# Patient Record
Sex: Female | Born: 1955
Health system: Southern US, Community
[De-identification: ages and names within clinical notes are randomized; demographics above are authoritative.]

## PROBLEM LIST (undated history)

## (undated) DIAGNOSIS — R7303 Prediabetes: Secondary | ICD-10-CM

## (undated) DIAGNOSIS — J439 Emphysema, unspecified: Secondary | ICD-10-CM

## (undated) DIAGNOSIS — I2510 Atherosclerotic heart disease of native coronary artery without angina pectoris: Secondary | ICD-10-CM

## (undated) DIAGNOSIS — E785 Hyperlipidemia, unspecified: Secondary | ICD-10-CM

## (undated) DIAGNOSIS — F329 Major depressive disorder, single episode, unspecified: Secondary | ICD-10-CM

## (undated) DIAGNOSIS — I10 Essential (primary) hypertension: Secondary | ICD-10-CM

## (undated) DIAGNOSIS — F32A Depression, unspecified: Secondary | ICD-10-CM

## (undated) DIAGNOSIS — F419 Anxiety disorder, unspecified: Secondary | ICD-10-CM

## (undated) DIAGNOSIS — J449 Chronic obstructive pulmonary disease, unspecified: Secondary | ICD-10-CM

## (undated) DIAGNOSIS — F17200 Nicotine dependence, unspecified, uncomplicated: Secondary | ICD-10-CM

## (undated) DIAGNOSIS — T7840XA Allergy, unspecified, initial encounter: Secondary | ICD-10-CM

## (undated) DIAGNOSIS — I700 Atherosclerosis of aorta: Secondary | ICD-10-CM

## (undated) HISTORY — DX: Chronic obstructive pulmonary disease, unspecified: J44.9

## (undated) HISTORY — DX: Atherosclerotic heart disease of native coronary artery without angina pectoris: I25.10

## (undated) HISTORY — DX: Allergy, unspecified, initial encounter: T78.40XA

## (undated) HISTORY — PX: MASTECTOMY: SHX3

## (undated) HISTORY — PX: COSMETIC SURGERY: SHX468

## (undated) HISTORY — PX: REDUCTION MAMMAPLASTY: SUR839

## (undated) HISTORY — DX: Prediabetes: R73.03

## (undated) HISTORY — DX: Depression, unspecified: F32.A

## (undated) HISTORY — PX: TUBAL LIGATION: SHX77

## (undated) HISTORY — DX: Hyperlipidemia, unspecified: E78.5

## (undated) HISTORY — DX: Essential (primary) hypertension: I10

## (undated) HISTORY — PX: BREAST BIOPSY: SHX20

## (undated) HISTORY — DX: Nicotine dependence, unspecified, uncomplicated: F17.200

## (undated) HISTORY — DX: Anxiety disorder, unspecified: F41.9

## (undated) HISTORY — DX: Emphysema, unspecified: J43.9

## (undated) HISTORY — DX: Atherosclerosis of aorta: I70.0

## (undated) SURGERY — Surgical Case
Anesthesia: *Unknown

---
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
## 1976-12-03 HISTORY — PX: TONSILLECTOMY: SUR1361

## 1979-12-04 HISTORY — PX: RHINOPLASTY: SUR1284

## 1999-02-08 ENCOUNTER — Other Ambulatory Visit: Admission: RE | Admit: 1999-02-08 | Discharge: 1999-02-08 | Payer: Self-pay | Admitting: *Deleted

## 1999-02-20 ENCOUNTER — Encounter: Payer: Self-pay | Admitting: *Deleted

## 1999-02-20 ENCOUNTER — Ambulatory Visit (HOSPITAL_COMMUNITY): Admission: RE | Admit: 1999-02-20 | Discharge: 1999-02-20 | Payer: Self-pay | Admitting: *Deleted

## 1999-12-21 ENCOUNTER — Encounter: Payer: Self-pay | Admitting: Emergency Medicine

## 1999-12-21 ENCOUNTER — Emergency Department (HOSPITAL_COMMUNITY): Admission: EM | Admit: 1999-12-21 | Discharge: 1999-12-21 | Payer: Self-pay | Admitting: Emergency Medicine

## 2000-02-29 ENCOUNTER — Ambulatory Visit (HOSPITAL_COMMUNITY): Admission: RE | Admit: 2000-02-29 | Discharge: 2000-02-29 | Payer: Self-pay | Admitting: *Deleted

## 2000-02-29 ENCOUNTER — Encounter: Payer: Self-pay | Admitting: *Deleted

## 2000-03-20 ENCOUNTER — Encounter: Admission: RE | Admit: 2000-03-20 | Discharge: 2000-03-20 | Payer: Self-pay | Admitting: *Deleted

## 2000-03-20 ENCOUNTER — Encounter: Payer: Self-pay | Admitting: *Deleted

## 2000-03-26 ENCOUNTER — Other Ambulatory Visit: Admission: RE | Admit: 2000-03-26 | Discharge: 2000-03-26 | Payer: Self-pay | Admitting: *Deleted

## 2001-04-15 ENCOUNTER — Ambulatory Visit (HOSPITAL_COMMUNITY): Admission: RE | Admit: 2001-04-15 | Discharge: 2001-04-15 | Payer: Self-pay | Admitting: *Deleted

## 2001-04-15 ENCOUNTER — Encounter: Payer: Self-pay | Admitting: *Deleted

## 2001-08-14 ENCOUNTER — Other Ambulatory Visit: Admission: RE | Admit: 2001-08-14 | Discharge: 2001-08-14 | Payer: Self-pay | Admitting: *Deleted

## 2002-12-24 ENCOUNTER — Ambulatory Visit (HOSPITAL_COMMUNITY): Admission: RE | Admit: 2002-12-24 | Discharge: 2002-12-24 | Payer: Self-pay | Admitting: *Deleted

## 2002-12-24 ENCOUNTER — Encounter: Payer: Self-pay | Admitting: *Deleted

## 2003-01-06 ENCOUNTER — Other Ambulatory Visit: Admission: RE | Admit: 2003-01-06 | Discharge: 2003-01-06 | Payer: Self-pay | Admitting: *Deleted

## 2004-01-25 ENCOUNTER — Ambulatory Visit (HOSPITAL_COMMUNITY): Admission: RE | Admit: 2004-01-25 | Discharge: 2004-01-25 | Payer: Self-pay | Admitting: *Deleted

## 2006-08-27 ENCOUNTER — Other Ambulatory Visit: Admission: RE | Admit: 2006-08-27 | Discharge: 2006-08-27 | Payer: Self-pay | Admitting: *Deleted

## 2006-10-02 ENCOUNTER — Encounter (INDEPENDENT_AMBULATORY_CARE_PROVIDER_SITE_OTHER): Payer: Self-pay | Admitting: Specialist

## 2006-10-02 ENCOUNTER — Ambulatory Visit (HOSPITAL_COMMUNITY): Admission: RE | Admit: 2006-10-02 | Discharge: 2006-10-02 | Payer: Self-pay | Admitting: *Deleted

## 2008-01-27 ENCOUNTER — Ambulatory Visit (HOSPITAL_COMMUNITY): Admission: RE | Admit: 2008-01-27 | Discharge: 2008-01-27 | Payer: Self-pay | Admitting: *Deleted

## 2008-07-21 ENCOUNTER — Emergency Department (HOSPITAL_BASED_OUTPATIENT_CLINIC_OR_DEPARTMENT_OTHER): Admission: EM | Admit: 2008-07-21 | Discharge: 2008-07-21 | Payer: Self-pay | Admitting: Emergency Medicine

## 2011-04-20 NOTE — Op Note (Signed)
[name], Robertson               ACCOUNT NO.:  [id number]   MEDICAL RECORD NO.:  [id number]          PATIENT TYPE:  AMB   LOCATION:  SDC                           FACILITY:  [facility]   PHYSICIAN:  Almedia Balls. Fore, M.D.   DATE OF BIRTH:  12/01/1956   DATE OF PROCEDURE:  10/02/2006  DATE OF DISCHARGE:                                 OPERATIVE REPORT   PREOPERATIVE DIAGNOSIS:  Postmenopausal bleeding.   POSTOPERATIVE DIAGNOSIS:  Postmenopausal bleeding, pending pathology.   OPERATION:  Diagnostic hysteroscopy, fractional D&C.   ANESTHESIA:  General LMA plus 10 mL 1% lidocaine paracervical block.   INDICATIONS FOR SURGERY:  The patient is a 55 year old with the above-noted  problems who was counseled as to the need for evaluation and the type of  surgery to be performed.  She was fully counseled as to the nature of the  procedure and the risks involved to include risks of anesthesia, injury to  uterus, tubes, ovaries, bowel, bladder, blood vessels, ureters,  postoperative hemorrhage, infection, recuperation.  She fully understands  all these considerations and wishes to proceed on 02 October 2006 and has  signed informed consent to proceed on October 02, 2006.   OPERATIVE FINDINGS:  On bimanual exam, the uterus was mid posterior top  normal size.  There were no palpable adnexal masses.  On hysteroscopy the  uterus sounded to 7.5 cm.  The endocervical canal was cleaned.  The  endometrial cavity had a small amount of shaggy appearing tissue present.   PROCEDURE:  With the patient under general anesthesia, prepared and draped  in the usual sterile fashion in the dorsal lithotomy position, speculum was  placed in vagina.  Single-tooth tenaculum was placed 12 o'clock position on  the cervix, and a solution of 1% lidocaine was injected at the 2, 4, 8, 10  and 12 o'clock positions of the cervix for a total of 10 mL for paracervical  block.  A small sharp curette was then utilized for curettage  of the  endocervical canal with a small amount of normal-appearing tissue being  obtained.  These the cervix was then dilated and the cervix and uterus were  then sounded as noted above and cervix dilated up to #21 [facility] dilator.  Diagnostic hysteroscope was introduced using free flow of [place] under  direct vision with the above-noted findings.  Hysteroscope was then removed,  a medium sharp curette and polyp forceps were used for removal of tissue  from the endometrial cavity.  Hysteroscope was re-employed to ensure that  all tissue had been removed and hemostasis was maintained.  After noting  this was the case and sponge and instrument counts were correct, procedure  was terminated.   ESTIMATED BLOOD LOSS:  Less than 25 mL.   The patient was taken to the recovery room in good condition.   FOLLOW-UP CARE:  She is to return to the office in 2 weeks for follow-up and  to call if heavy bleeding, pain or unexplained fever should ensue.  She was  given a prescription for Darvocet N 100 generic #10 to be  taken one-half to  one q.6 h p.r.n. pain.           ______________________________  Almedia Balls. Randell Patient, M.D.     SRF/MEDQ  D:  10/02/2006  T:  10/02/2006  Job:  [id number]

## 2011-11-09 ENCOUNTER — Ambulatory Visit: Payer: Self-pay | Admitting: Family Medicine

## 2013-03-25 ENCOUNTER — Ambulatory Visit: Payer: 59 | Attending: Orthopedic Surgery | Admitting: Physical Therapy

## 2013-03-25 DIAGNOSIS — M25619 Stiffness of unspecified shoulder, not elsewhere classified: Secondary | ICD-10-CM | POA: Insufficient documentation

## 2013-03-25 DIAGNOSIS — M25519 Pain in unspecified shoulder: Secondary | ICD-10-CM | POA: Insufficient documentation

## 2013-03-25 DIAGNOSIS — M6281 Muscle weakness (generalized): Secondary | ICD-10-CM | POA: Insufficient documentation

## 2013-03-25 DIAGNOSIS — IMO0001 Reserved for inherently not codable concepts without codable children: Secondary | ICD-10-CM | POA: Insufficient documentation

## 2013-03-31 ENCOUNTER — Ambulatory Visit: Payer: 59 | Admitting: Rehabilitation

## 2013-04-03 ENCOUNTER — Ambulatory Visit: Payer: 59 | Attending: Orthopedic Surgery | Admitting: Physical Therapy

## 2013-04-03 DIAGNOSIS — M25619 Stiffness of unspecified shoulder, not elsewhere classified: Secondary | ICD-10-CM | POA: Insufficient documentation

## 2013-04-03 DIAGNOSIS — IMO0001 Reserved for inherently not codable concepts without codable children: Secondary | ICD-10-CM | POA: Insufficient documentation

## 2013-04-03 DIAGNOSIS — M6281 Muscle weakness (generalized): Secondary | ICD-10-CM | POA: Insufficient documentation

## 2013-04-03 DIAGNOSIS — M25519 Pain in unspecified shoulder: Secondary | ICD-10-CM | POA: Insufficient documentation

## 2013-04-06 ENCOUNTER — Ambulatory Visit: Payer: 59 | Admitting: Rehabilitation

## 2013-04-09 ENCOUNTER — Ambulatory Visit: Payer: 59 | Admitting: Rehabilitation

## 2013-04-13 ENCOUNTER — Ambulatory Visit: Payer: 59 | Admitting: Physical Therapy

## 2013-04-16 ENCOUNTER — Ambulatory Visit: Payer: 59 | Admitting: Rehabilitation

## 2013-04-22 ENCOUNTER — Ambulatory Visit: Payer: 59 | Admitting: Physical Therapy

## 2013-04-24 ENCOUNTER — Ambulatory Visit: Payer: 59 | Admitting: Rehabilitation

## 2013-04-29 ENCOUNTER — Ambulatory Visit: Payer: 59 | Admitting: Physical Therapy

## 2013-05-05 ENCOUNTER — Ambulatory Visit: Payer: 59 | Attending: Orthopedic Surgery | Admitting: Physical Therapy

## 2013-05-05 DIAGNOSIS — M25519 Pain in unspecified shoulder: Secondary | ICD-10-CM | POA: Insufficient documentation

## 2013-05-05 DIAGNOSIS — M25619 Stiffness of unspecified shoulder, not elsewhere classified: Secondary | ICD-10-CM | POA: Insufficient documentation

## 2013-05-05 DIAGNOSIS — IMO0001 Reserved for inherently not codable concepts without codable children: Secondary | ICD-10-CM | POA: Insufficient documentation

## 2013-05-05 DIAGNOSIS — M6281 Muscle weakness (generalized): Secondary | ICD-10-CM | POA: Insufficient documentation

## 2015-09-29 ENCOUNTER — Other Ambulatory Visit (HOSPITAL_COMMUNITY)
Admission: RE | Admit: 2015-09-29 | Discharge: 2015-09-29 | Disposition: A | Discharge status: Home / Self Care | Payer: 59 | Source: Ambulatory Visit | Attending: Family Medicine | Admitting: Family Medicine

## 2015-09-29 ENCOUNTER — Other Ambulatory Visit: Payer: Self-pay | Admitting: Family Medicine

## 2015-09-29 DIAGNOSIS — Z124 Encounter for screening for malignant neoplasm of cervix: Secondary | ICD-10-CM | POA: Diagnosis not present

## 2015-10-03 ENCOUNTER — Other Ambulatory Visit: Payer: Self-pay | Admitting: Family Medicine

## 2015-10-03 ENCOUNTER — Other Ambulatory Visit: Payer: Self-pay

## 2015-10-03 DIAGNOSIS — Z1231 Encounter for screening mammogram for malignant neoplasm of breast: Secondary | ICD-10-CM

## 2015-10-03 DIAGNOSIS — R0989 Other specified symptoms and signs involving the circulatory and respiratory systems: Secondary | ICD-10-CM

## 2015-10-04 DIAGNOSIS — I6523 Occlusion and stenosis of bilateral carotid arteries: Secondary | ICD-10-CM

## 2015-10-04 HISTORY — DX: Occlusion and stenosis of bilateral carotid arteries: I65.23

## 2015-10-05 LAB — CYTOLOGY - PAP

## 2015-10-12 ENCOUNTER — Ambulatory Visit
Admission: RE | Admit: 2015-10-12 | Discharge: 2015-10-12 | Disposition: A | Discharge status: Home / Self Care | Payer: 59 | Source: Ambulatory Visit | Attending: Family Medicine | Admitting: Family Medicine

## 2015-10-12 DIAGNOSIS — R0989 Other specified symptoms and signs involving the circulatory and respiratory systems: Secondary | ICD-10-CM

## 2015-10-26 ENCOUNTER — Emergency Department (HOSPITAL_COMMUNITY)
Admission: EM | Admit: 2015-10-26 | Discharge: 2015-10-26 | Disposition: A | Discharge status: Home / Self Care | Payer: 59 | Source: Home / Self Care | Attending: Emergency Medicine | Admitting: Emergency Medicine

## 2015-10-26 ENCOUNTER — Encounter (HOSPITAL_COMMUNITY): Payer: Self-pay | Admitting: Emergency Medicine

## 2015-10-26 DIAGNOSIS — J40 Bronchitis, not specified as acute or chronic: Secondary | ICD-10-CM

## 2015-10-26 MED ORDER — IPRATROPIUM-ALBUTEROL 0.5-2.5 (3) MG/3ML IN SOLN
RESPIRATORY_TRACT | Status: AC
  Filled 2015-10-26: qty 3

## 2015-10-26 MED ORDER — AZITHROMYCIN 250 MG PO TABS: ORAL_TABLET | ORAL | Status: DC

## 2015-10-26 MED ORDER — ALBUTEROL SULFATE HFA 108 (90 BASE) MCG/ACT IN AERS: 2.0000 | INHALATION_SPRAY | RESPIRATORY_TRACT | Status: DC | PRN

## 2015-10-26 MED ORDER — PREDNISONE 50 MG PO TABS: ORAL_TABLET | ORAL | Status: DC

## 2015-10-26 MED ORDER — HYDROCODONE-HOMATROPINE 5-1.5 MG/5ML PO SYRP: 5.0000 mL | ORAL_SOLUTION | Freq: Four times a day (QID) | ORAL | Status: DC | PRN

## 2015-10-26 MED ORDER — IPRATROPIUM-ALBUTEROL 0.5-2.5 (3) MG/3ML IN SOLN
3.0000 mL | Freq: Once | RESPIRATORY_TRACT | Status: AC
  Administered 2015-10-26: 3 mL via RESPIRATORY_TRACT

## 2015-10-26 NOTE — Discharge Instructions (Signed)
You have bronchitis. °Take azithromycin and prednisone as prescribed. °Use hycodan as needed for cough. °Use the albuterol every 4 hours as needed for wheezing or cough. °You should see improvement in the next 3-5 days. °If you develop fevers, difficulty breathing, or are just not getting better, please come back or go to the emergency room. ° °

## 2015-10-26 NOTE — ED Notes (Signed)
Chest congestion since Sunday.

## 2015-10-26 NOTE — ED Provider Notes (Signed)
CSN: [id number]     Arrival date & time 10/26/15  1541 History   None    Chief Complaint  Patient presents with  . URI   (Consider location/radiation/quality/duration/timing/severity/associated sxs/prior Treatment) HPI  She is a 59 year old woman here for evaluation of cough. She states she started coughing 3 days ago. It has gradually been getting worse. She reports associated nasal congestion and rhinorrhea. Today, she had an episode of chest tightness and wheezing. She works in the [employer] and someone gave her an albuterol inhaler which relieved her symptoms.  She denies any fevers. No nausea or vomiting. Her appetite is normal. She states she had a similar illness last year.  History reviewed. No pertinent past medical history. Past Surgical History  Procedure Laterality Date  . Cesarean section     No family history on file. Social History  Substance Use Topics  . Smoking status: Current Every Day Smoker  . Smokeless tobacco: None  . Alcohol Use: Yes   OB History    No data available     Review of Systems As in history of present illness Allergies  Review of patient's allergies indicates no known allergies.  Home Medications   Prior to Admission medications   Medication Sig Start Date End Date Taking? Authorizing Provider  FLUoxetine (PROZAC) 20 MG capsule Take 20 mg by mouth daily.   Yes Historical Provider, MD  OVER THE COUNTER MEDICATION otc cold medicine   Yes Historical Provider, MD  albuterol (PROVENTIL HFA;VENTOLIN HFA) 108 (90 BASE) MCG/ACT inhaler Inhale 2 puffs into the lungs every 4 (four) hours as needed for wheezing or shortness of breath. 10/26/15   Melony Overly, MD  azithromycin (ZITHROMAX Z-PAK) 250 MG tablet Take 2 pills today, then 1 pill daily until gone. 10/26/15   Melony Overly, MD  HYDROcodone-homatropine (HYCODAN) 5-1.5 MG/5ML syrup Take 5 mLs by mouth every 6 (six) hours as needed for cough. 10/26/15   Melony Overly, MD  predniSONE  (DELTASONE) 50 MG tablet Take 1 pill daily for 5 days. 10/26/15   Melony Overly, MD   Meds Ordered and Administered this Visit   Medications  ipratropium-albuterol (DUONEB) 0.5-2.5 (3) MG/3ML nebulizer solution 3 mL (3 mLs Nebulization Given 10/26/15 1815)    BP 107/77 mmHg  Pulse 68  Temp(Src) 98.3 F (36.8 C) (Oral)  Resp 16  SpO2 96% No data found.   Physical Exam  Constitutional: She is oriented to person, place, and time. She appears well-developed and well-nourished. No distress.  Neck: Neck supple.  Cardiovascular: Normal rate, regular rhythm and normal heart sounds.   No murmur heard. Pulmonary/Chest: Effort normal. No respiratory distress. She has wheezes (diffuse expiratory). She has no rales.  Neurological: She is alert and oriented to person, place, and time.    ED Course  Procedures (including critical care time)  Labs Review Labs Reviewed - No data to display  Imaging Review No results found.   MDM   1. Bronchitis    She continues to have some wheezing after the DuoNeb treatment, but this is much improved. She reports subjective improvement as well. We'll discharge with prednisone, azithromycin, and Hycodan for cough. She alreday has an albuterol inhaler from earlier today. Follow-up as needed.    Melony Overly, MD 10/26/15 [phone]

## 2015-10-31 ENCOUNTER — Encounter: Payer: Self-pay | Admitting: Gastroenterology

## 2015-11-09 ENCOUNTER — Ambulatory Visit
Admission: RE | Admit: 2015-11-09 | Discharge: 2015-11-09 | Disposition: A | Discharge status: Home / Self Care | Payer: 59 | Source: Ambulatory Visit

## 2015-11-09 DIAGNOSIS — Z1231 Encounter for screening mammogram for malignant neoplasm of breast: Secondary | ICD-10-CM

## 2015-12-20 ENCOUNTER — Encounter: Payer: Self-pay | Admitting: Vascular Surgery

## 2015-12-26 MED FILL — FLUoxetine HCL 20 MG CAPS: 20 | 90 days supply | Qty: 90 | Fill #0

## 2015-12-27 ENCOUNTER — Ambulatory Visit (INDEPENDENT_AMBULATORY_CARE_PROVIDER_SITE_OTHER): Payer: 59 | Admitting: Vascular Surgery

## 2015-12-27 ENCOUNTER — Encounter: Payer: Self-pay | Admitting: Vascular Surgery

## 2015-12-27 VITALS — BP 188/83 | HR 64 | Resp 18 | Ht 69.5 in | Wt 196.6 lb

## 2015-12-27 DIAGNOSIS — I6523 Occlusion and stenosis of bilateral carotid arteries: Secondary | ICD-10-CM | POA: Diagnosis not present

## 2015-12-27 NOTE — Progress Notes (Signed)
[facility] [place]  Patient name: Ashley Robertson MRN: [id number] DOB: [birth year] Sex: female  REASON FOR VISIT: Evaluation of recent carotid duplex  HPI: Ashley Robertson is a 60 y.o. 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 She was found to have left-sided carotid bruit and underwent carotid duplex for further evaluation. Her date of her exam was 10/12/2015 at [place]. She is here today for further discussion of this. She does have a history of severe Bell's palsy in 2001 affecting her left side of her face. She has some residual inability to lift her eyebrow on the left and also some inability to raise her left lip. This does not cause her any significant disability. He specifically denies any other episodes of amaurosis fugax, transient ischemic attack and aphasia or stroke. She has a long history of cigarette smoking. No cardiac history.  History reviewed. No pertinent past medical history.  History reviewed. No pertinent family history.  SOCIAL HISTORY: Social History  Substance Use Topics  . Smoking status: Current Every Day Smoker -- 1.00 packs/day    Types: Cigarettes  . Smokeless tobacco: Not on file  . Alcohol Use: 0.0 oz/week    0 Standard drinks or equivalent per week    No Known Allergies  Current Outpatient Prescriptions  Medication Sig Dispense Refill  . aspirin 81 MG tablet Take 81 mg by mouth daily.    Marland Kitchen FLUoxetine (PROZAC) 20 MG capsule Take 20 mg by mouth daily.    . TURMERIC PO Take by mouth.    Marland Kitchen albuterol (PROVENTIL HFA;VENTOLIN HFA) 108 (90 BASE) MCG/ACT inhaler Inhale 2 puffs into the lungs every 4 (four) hours as needed for wheezing or shortness of breath. (Patient not taking: Reported on 12/27/2015)  0  . azithromycin (ZITHROMAX Z-PAK) 250 MG tablet Take 2 pills today, then 1 pill daily until gone. (Patient not taking: Reported on 12/27/2015) 6 tablet 0  .  HYDROcodone-homatropine (HYCODAN) 5-1.5 MG/5ML syrup Take 5 mLs by mouth every 6 (six) hours as needed for cough. (Patient not taking: Reported on 12/27/2015) 120 mL 0  . OVER THE COUNTER MEDICATION Reported on 12/27/2015    . predniSONE (DELTASONE) 50 MG tablet Take 1 pill daily for 5 days. (Patient not taking: Reported on 12/27/2015) 5 tablet 0   No current facility-administered medications for this visit.    REVIEW OF SYSTEMS:  [X]  denotes positive finding, [ ]  denotes negative finding Cardiac  Comments:  Chest pain or chest pressure:    Shortness of breath upon exertion:    Short of breath when lying flat:    Irregular heart rhythm:        Vascular    Pain in calf, thigh, or hip brought on by ambulation:    Pain in feet at night that wakes you up from your sleep:     Blood clot in your veins:    Leg swelling:         Pulmonary    Oxygen at home:    Productive cough:     Wheezing:         Neurologic    Sudden weakness in arms or legs:     Sudden numbness in arms or legs:     Sudden onset of difficulty speaking or slurred speech:    Temporary loss of vision in one eye:     Problems with dizziness:  Gastrointestinal    Blood in stool:     Vomited blood:         Genitourinary    Burning when urinating:     Blood in urine:        Psychiatric    Major depression:         Hematologic    Bleeding problems:    Problems with blood clotting too easily:        Skin    Rashes or ulcers:        Constitutional    Fever or chills:      PHYSICAL EXAM: Filed Vitals:   12/27/15 0913 12/27/15 0920  BP: 167/86 188/83  Pulse: 64   Resp: 18   Height: 5' 9.5" (1.765 m)   Weight: 196 lb 9.6 oz (89.177 kg)   SpO2: 96%     GENERAL: The patient is a well-nourished female, in no acute distress. The vital signs are documented above. CARDIAC: There is a regular rate and rhythm.  VASCULAR: Did not appreciate carotid bruits today. She does have 2+ radial pulses  bilaterally PULMONARY: There is good air exchange   MUSCULOSKELETAL: There are no major deformities or cyanosis. NEUROLOGIC: Some facial weakness in her left side of her face SKIN: There are no ulcers or rashes noted. PSYCHIATRIC: The patient has a normal affect.  DATA:  Reviewed her carotid ultrasound. The radiologist interpretation was that of mild to moderate amount of atherosclerotic plaque bilaterally. She has low flow velocities in her right and left internal carotid artery with no evidence of stenosis  MEDICAL ISSUES: Reviewed the study at length with patient. Explained there is no increased risk of stroke related to her study. She does have some plaque present but no evidence of moderate to severe narrowing. Would not recommend a follow-up study unless she has any neurologic changes. I again explained that this is related to cigarette smoking regarding amount of plaque and she has present and again encouraged her to attempt smoking cessation. She was reassured with this discussion will see [place] again on as-needed basis No Follow-up on file.   Curt Jews [facility] [place]: [phone]

## 2016-01-20 ENCOUNTER — Encounter: Payer: 59 | Admitting: Gastroenterology

## 2016-01-30 ENCOUNTER — Encounter: Payer: Self-pay | Admitting: Family Medicine

## 2016-04-09 MED FILL — FLUoxetine HCL 20 MG CAPS: 20 | 90 days supply | Qty: 90 | Fill #1

## 2016-07-16 MED FILL — FLUoxetine HCL 20 MG CAPS: 20 | 90 days supply | Qty: 90 | Fill #0

## 2016-08-28 DIAGNOSIS — J209 Acute bronchitis, unspecified: Secondary | ICD-10-CM | POA: Diagnosis not present

## 2016-08-28 MED FILL — VENTOLIN HFA 90 MCG INHALER: 108 (90 BAS | 16 days supply | Qty: 18 | Fill #0

## 2016-08-28 MED FILL — AZITHROMYCIN 250 MG TABLET: 250 | 5 days supply | Qty: 6 | Fill #0

## 2016-08-28 MED FILL — predniSONE 10 MG (21) TBPK: 10 | 6 days supply | Qty: 21 | Fill #0

## 2016-09-20 DIAGNOSIS — J209 Acute bronchitis, unspecified: Secondary | ICD-10-CM | POA: Diagnosis not present

## 2016-09-20 DIAGNOSIS — R062 Wheezing: Secondary | ICD-10-CM | POA: Diagnosis not present

## 2016-09-20 DIAGNOSIS — R05 Cough: Secondary | ICD-10-CM | POA: Diagnosis not present

## 2016-09-20 MED FILL — levoFLOXacin 750 MG TABS: 750 | 5 days supply | Qty: 5 | Fill #0

## 2016-09-20 MED FILL — IPRATROPIUM 0.06% SPRAY: 0.06 | 30 days supply | Qty: 15 | Fill #0

## 2016-09-20 MED FILL — predniSONE 20 MG TABS: 20 | 5 days supply | Qty: 15 | Fill #0

## 2016-09-21 MED FILL — BROMIPHENIR-PSEUDOEPHED-DM: 30-2-10 | 4 days supply | Qty: 180 | Fill #0

## 2016-10-01 DIAGNOSIS — Z0000 Encounter for general adult medical examination without abnormal findings: Secondary | ICD-10-CM | POA: Diagnosis not present

## 2016-10-01 DIAGNOSIS — R011 Cardiac murmur, unspecified: Secondary | ICD-10-CM | POA: Diagnosis not present

## 2016-10-01 DIAGNOSIS — F339 Major depressive disorder, recurrent, unspecified: Secondary | ICD-10-CM | POA: Diagnosis not present

## 2016-10-01 DIAGNOSIS — N6459 Other signs and symptoms in breast: Secondary | ICD-10-CM | POA: Diagnosis not present

## 2016-10-01 DIAGNOSIS — E7800 Pure hypercholesterolemia, unspecified: Secondary | ICD-10-CM | POA: Diagnosis not present

## 2016-10-03 ENCOUNTER — Other Ambulatory Visit: Payer: Self-pay | Admitting: Family Medicine

## 2016-10-03 DIAGNOSIS — R011 Cardiac murmur, unspecified: Secondary | ICD-10-CM

## 2016-10-03 DIAGNOSIS — N6459 Other signs and symptoms in breast: Secondary | ICD-10-CM

## 2016-10-03 HISTORY — PX: TRANSTHORACIC ECHOCARDIOGRAM: SHX275

## 2016-10-09 ENCOUNTER — Other Ambulatory Visit: Payer: Self-pay | Admitting: Family Medicine

## 2016-10-09 DIAGNOSIS — R011 Cardiac murmur, unspecified: Secondary | ICD-10-CM

## 2016-10-10 MED FILL — FLUoxetine HCL 40 MG CAPS: 40 | 90 days supply | Qty: 90 | Fill #0

## 2016-10-17 DIAGNOSIS — H52221 Regular astigmatism, right eye: Secondary | ICD-10-CM | POA: Diagnosis not present

## 2016-10-17 DIAGNOSIS — H5203 Hypermetropia, bilateral: Secondary | ICD-10-CM | POA: Diagnosis not present

## 2016-10-29 ENCOUNTER — Other Ambulatory Visit: Payer: Self-pay

## 2016-10-29 ENCOUNTER — Ambulatory Visit (HOSPITAL_COMMUNITY): Payer: 59 | Attending: Cardiovascular Disease

## 2016-10-29 DIAGNOSIS — E785 Hyperlipidemia, unspecified: Secondary | ICD-10-CM | POA: Insufficient documentation

## 2016-10-29 DIAGNOSIS — Z720 Tobacco use: Secondary | ICD-10-CM | POA: Diagnosis not present

## 2016-10-29 DIAGNOSIS — R011 Cardiac murmur, unspecified: Secondary | ICD-10-CM | POA: Insufficient documentation

## 2016-10-29 DIAGNOSIS — G510 Bell's palsy: Secondary | ICD-10-CM | POA: Diagnosis not present

## 2016-11-07 ENCOUNTER — Ambulatory Visit
Admission: RE | Admit: 2016-11-07 | Discharge: 2016-11-07 | Disposition: A | Discharge status: Home / Self Care | Payer: 59 | Source: Ambulatory Visit | Attending: Family Medicine | Admitting: Family Medicine

## 2016-11-07 DIAGNOSIS — N6489 Other specified disorders of breast: Secondary | ICD-10-CM | POA: Diagnosis not present

## 2016-11-07 DIAGNOSIS — N6459 Other signs and symptoms in breast: Secondary | ICD-10-CM

## 2016-11-07 DIAGNOSIS — R928 Other abnormal and inconclusive findings on diagnostic imaging of breast: Secondary | ICD-10-CM | POA: Diagnosis not present

## 2016-11-09 ENCOUNTER — Other Ambulatory Visit: Payer: 59

## 2017-01-10 MED FILL — FLUoxetine HCL 40 MG CAPS: 40 | 90 days supply | Qty: 90 | Fill #0

## 2017-04-19 MED FILL — FLUoxetine HCL 40 MG CAPS: 40 | 90 days supply | Qty: 90 | Fill #1

## 2017-07-31 MED FILL — FLUoxetine HCL 40 MG CAPS: 40 | 90 days supply | Qty: 90 | Fill #2

## 2017-08-19 MED FILL — PENICILLIN VK 500 MG TABLET: 500 | 7 days supply | Qty: 30 | Fill #0

## 2017-10-14 ENCOUNTER — Other Ambulatory Visit: Payer: Self-pay | Admitting: Family Medicine

## 2017-10-14 DIAGNOSIS — Z0000 Encounter for general adult medical examination without abnormal findings: Secondary | ICD-10-CM | POA: Diagnosis not present

## 2017-10-14 DIAGNOSIS — I10 Essential (primary) hypertension: Secondary | ICD-10-CM | POA: Diagnosis not present

## 2017-10-14 DIAGNOSIS — E7800 Pure hypercholesterolemia, unspecified: Secondary | ICD-10-CM | POA: Diagnosis not present

## 2017-10-14 DIAGNOSIS — Z139 Encounter for screening, unspecified: Secondary | ICD-10-CM

## 2017-10-14 DIAGNOSIS — F339 Major depressive disorder, recurrent, unspecified: Secondary | ICD-10-CM | POA: Diagnosis not present

## 2017-10-14 MED FILL — VENTOLIN HFA 90 MCG INHALER: 108 (90 BAS | 25 days supply | Qty: 18 | Fill #0

## 2017-11-04 MED FILL — FLUoxetine HCL 40 MG CAPS: 40 | 90 days supply | Qty: 90 | Fill #0

## 2017-11-14 ENCOUNTER — Encounter: Payer: Self-pay | Admitting: Radiology

## 2017-11-14 ENCOUNTER — Ambulatory Visit
Admission: RE | Admit: 2017-11-14 | Discharge: 2017-11-14 | Disposition: A | Discharge status: Home / Self Care | Payer: 59 | Source: Ambulatory Visit | Attending: Family Medicine | Admitting: Family Medicine

## 2017-11-14 DIAGNOSIS — Z139 Encounter for screening, unspecified: Secondary | ICD-10-CM

## 2017-11-14 DIAGNOSIS — Z1231 Encounter for screening mammogram for malignant neoplasm of breast: Secondary | ICD-10-CM | POA: Diagnosis not present

## 2018-02-05 MED FILL — FLUoxetine HCL 40 MG CAPS: 40 | 90 days supply | Qty: 90 | Fill #1

## 2018-02-13 DIAGNOSIS — J208 Acute bronchitis due to other specified organisms: Secondary | ICD-10-CM | POA: Diagnosis not present

## 2018-02-13 MED FILL — BROMIPHENIR-PSEUDOEPHED-DM: 30-2-10 | 10 days supply | Qty: 400 | Fill #0

## 2018-02-13 MED FILL — HYDROCODONE-CHLORPHENIRAM S: 10-8 | 6 days supply | Qty: 60 | Fill #0

## 2018-02-13 MED FILL — AZITHROMYCIN 250 MG TABS: 250 | 5 days supply | Qty: 6 | Fill #0

## 2018-03-26 DIAGNOSIS — B302 Viral pharyngoconjunctivitis: Secondary | ICD-10-CM | POA: Diagnosis not present

## 2018-04-29 MED FILL — FLUoxetine HCL 40 MG CAPS: 40 | 90 days supply | Qty: 90 | Fill #2

## 2018-05-22 DIAGNOSIS — M25561 Pain in right knee: Secondary | ICD-10-CM | POA: Diagnosis not present

## 2018-06-12 ENCOUNTER — Other Ambulatory Visit (HOSPITAL_COMMUNITY): Payer: Self-pay | Admitting: Physician Assistant

## 2018-06-12 DIAGNOSIS — M25561 Pain in right knee: Secondary | ICD-10-CM | POA: Diagnosis not present

## 2018-06-18 ENCOUNTER — Ambulatory Visit (HOSPITAL_COMMUNITY)
Admission: RE | Admit: 2018-06-18 | Discharge: 2018-06-18 | Disposition: A | Discharge status: Home / Self Care | Payer: 59 | Source: Ambulatory Visit | Attending: Physician Assistant | Admitting: Physician Assistant

## 2018-06-18 DIAGNOSIS — X58XXXA Exposure to other specified factors, initial encounter: Secondary | ICD-10-CM | POA: Insufficient documentation

## 2018-06-18 DIAGNOSIS — S83411A Sprain of medial collateral ligament of right knee, initial encounter: Secondary | ICD-10-CM | POA: Diagnosis not present

## 2018-06-18 DIAGNOSIS — M7121 Synovial cyst of popliteal space [Baker], right knee: Secondary | ICD-10-CM | POA: Insufficient documentation

## 2018-06-18 DIAGNOSIS — M25461 Effusion, right knee: Secondary | ICD-10-CM | POA: Diagnosis not present

## 2018-06-18 DIAGNOSIS — M25561 Pain in right knee: Secondary | ICD-10-CM

## 2018-06-18 DIAGNOSIS — S83241A Other tear of medial meniscus, current injury, right knee, initial encounter: Secondary | ICD-10-CM | POA: Diagnosis not present

## 2018-07-03 DIAGNOSIS — S83241A Other tear of medial meniscus, current injury, right knee, initial encounter: Secondary | ICD-10-CM | POA: Diagnosis not present

## 2018-07-03 DIAGNOSIS — S83411A Sprain of medial collateral ligament of right knee, initial encounter: Secondary | ICD-10-CM | POA: Diagnosis not present

## 2018-08-08 MED FILL — FLUoxetine HCL 40 MG CAPS: 40 | 90 days supply | Qty: 90 | Fill #3

## 2018-08-21 MED FILL — VENTOLIN HFA 90 MCG INHALER: 108 (90 BAS | 25 days supply | Qty: 18 | Fill #0

## 2018-08-25 DIAGNOSIS — M7121 Synovial cyst of popliteal space [Baker], right knee: Secondary | ICD-10-CM | POA: Diagnosis not present

## 2018-08-25 DIAGNOSIS — M25561 Pain in right knee: Secondary | ICD-10-CM | POA: Diagnosis not present

## 2018-10-27 ENCOUNTER — Other Ambulatory Visit (HOSPITAL_COMMUNITY)
Admission: RE | Admit: 2018-10-27 | Discharge: 2018-10-27 | Disposition: A | Discharge status: Home / Self Care | Payer: 59 | Source: Ambulatory Visit | Attending: Family Medicine | Admitting: Family Medicine

## 2018-10-27 ENCOUNTER — Other Ambulatory Visit: Payer: Self-pay | Admitting: Family Medicine

## 2018-10-27 DIAGNOSIS — Z124 Encounter for screening for malignant neoplasm of cervix: Secondary | ICD-10-CM | POA: Insufficient documentation

## 2018-10-27 DIAGNOSIS — E559 Vitamin D deficiency, unspecified: Secondary | ICD-10-CM | POA: Diagnosis not present

## 2018-10-27 DIAGNOSIS — I6529 Occlusion and stenosis of unspecified carotid artery: Secondary | ICD-10-CM | POA: Diagnosis not present

## 2018-10-27 DIAGNOSIS — R0609 Other forms of dyspnea: Secondary | ICD-10-CM | POA: Diagnosis not present

## 2018-10-27 DIAGNOSIS — Z720 Tobacco use: Secondary | ICD-10-CM | POA: Diagnosis not present

## 2018-10-27 DIAGNOSIS — Z0000 Encounter for general adult medical examination without abnormal findings: Secondary | ICD-10-CM | POA: Diagnosis not present

## 2018-10-27 DIAGNOSIS — F339 Major depressive disorder, recurrent, unspecified: Secondary | ICD-10-CM | POA: Diagnosis not present

## 2018-10-27 DIAGNOSIS — R739 Hyperglycemia, unspecified: Secondary | ICD-10-CM | POA: Diagnosis not present

## 2018-10-27 DIAGNOSIS — E7800 Pure hypercholesterolemia, unspecified: Secondary | ICD-10-CM | POA: Diagnosis not present

## 2018-10-28 LAB — CYTOLOGY - PAP: DIAGNOSIS: NEGATIVE

## 2018-11-03 MED FILL — VIT D2 1.25 MG (50,000 UNIT: 1.25 MG | 56 days supply | Qty: 8 | Fill #0

## 2018-11-11 MED FILL — FLUoxetine HCL 40 MG CAPS: 40 | 90 days supply | Qty: 90 | Fill #0

## 2018-12-02 ENCOUNTER — Other Ambulatory Visit: Payer: Self-pay | Admitting: Family Medicine

## 2018-12-02 DIAGNOSIS — Z1231 Encounter for screening mammogram for malignant neoplasm of breast: Secondary | ICD-10-CM

## 2018-12-10 ENCOUNTER — Encounter: Payer: Self-pay | Admitting: *Deleted

## 2018-12-25 ENCOUNTER — Ambulatory Visit: Payer: 59

## 2019-01-21 ENCOUNTER — Ambulatory Visit
Admission: RE | Admit: 2019-01-21 | Discharge: 2019-01-21 | Disposition: A | Discharge status: Home / Self Care | Payer: 59 | Source: Ambulatory Visit | Attending: Family Medicine | Admitting: Family Medicine

## 2019-01-21 DIAGNOSIS — Z1231 Encounter for screening mammogram for malignant neoplasm of breast: Secondary | ICD-10-CM | POA: Diagnosis not present

## 2019-02-12 MED FILL — FLUoxetine HCL 40 MG CAPS: 40 | 90 days supply | Qty: 90 | Fill #1

## 2019-04-02 MED FILL — ALBUTEROL SULFATE HFA 108 (: 108 (90 BAS | 25 days supply | Qty: 18 | Fill #0

## 2019-05-26 MED FILL — FLUoxetine HCL 40 MG CAPS: 40 | 90 days supply | Qty: 90 | Fill #2

## 2019-07-13 MED FILL — AMOXICILLIN 500 MG CAPSULE: 500 | 5 days supply | Qty: 21 | Fill #0

## 2019-07-13 MED FILL — IBUPROFEN 800 MG TAB: 800 | 5 days supply | Qty: 21 | Fill #0

## 2019-07-21 MED FILL — CLINDAMYCIN HCL 150 MG CAPS: 150 | 6 days supply | Qty: 40 | Fill #0

## 2019-08-21 MED FILL — IBUPROFEN 800 MG TAB: 800 | 3 days supply | Qty: 10 | Fill #0

## 2019-08-21 MED FILL — HYDROCODON-APAP 7.5-325: 7.5-325 | 1 days supply | Qty: 3 | Fill #0

## 2019-08-21 MED FILL — AMOXICILLIN 500 MG CAPSULE: 500 | 10 days supply | Qty: 30 | Fill #0

## 2019-09-07 MED FILL — FLUoxetine HCL 20 MG CAPS: 20 | 90 days supply | Qty: 90 | Fill #0

## 2019-11-30 MED FILL — FLUoxetine HCL 20 MG CAPS: 20 | 90 days supply | Qty: 90 | Fill #0

## 2019-12-04 DIAGNOSIS — C4490 Unspecified malignant neoplasm of skin, unspecified: Secondary | ICD-10-CM

## 2019-12-04 HISTORY — DX: Unspecified malignant neoplasm of skin, unspecified: C44.90

## 2020-02-10 ENCOUNTER — Telehealth: Payer: Self-pay | Admitting: Acute Care

## 2020-02-10 ENCOUNTER — Other Ambulatory Visit: Payer: Self-pay | Admitting: Family Medicine

## 2020-02-10 DIAGNOSIS — F17210 Nicotine dependence, cigarettes, uncomplicated: Secondary | ICD-10-CM

## 2020-02-10 DIAGNOSIS — Z87891 Personal history of nicotine dependence: Secondary | ICD-10-CM

## 2020-02-10 DIAGNOSIS — Z1231 Encounter for screening mammogram for malignant neoplasm of breast: Secondary | ICD-10-CM

## 2020-02-11 ENCOUNTER — Encounter: Payer: Self-pay | Admitting: Cardiology

## 2020-02-11 DIAGNOSIS — R7303 Prediabetes: Secondary | ICD-10-CM | POA: Diagnosis not present

## 2020-02-11 DIAGNOSIS — I6529 Occlusion and stenosis of unspecified carotid artery: Secondary | ICD-10-CM | POA: Diagnosis not present

## 2020-02-11 DIAGNOSIS — E7800 Pure hypercholesterolemia, unspecified: Secondary | ICD-10-CM | POA: Diagnosis not present

## 2020-02-11 DIAGNOSIS — Z0000 Encounter for general adult medical examination without abnormal findings: Secondary | ICD-10-CM | POA: Diagnosis not present

## 2020-02-11 DIAGNOSIS — F339 Major depressive disorder, recurrent, unspecified: Secondary | ICD-10-CM | POA: Diagnosis not present

## 2020-02-11 DIAGNOSIS — E2839 Other primary ovarian failure: Secondary | ICD-10-CM | POA: Diagnosis not present

## 2020-02-11 DIAGNOSIS — Z23 Encounter for immunization: Secondary | ICD-10-CM | POA: Diagnosis not present

## 2020-02-11 DIAGNOSIS — R030 Elevated blood-pressure reading, without diagnosis of hypertension: Secondary | ICD-10-CM | POA: Diagnosis not present

## 2020-02-11 DIAGNOSIS — E559 Vitamin D deficiency, unspecified: Secondary | ICD-10-CM | POA: Diagnosis not present

## 2020-02-11 MED FILL — FLUoxetine HCL 10 MG CAPS: 10 | 30 days supply | Qty: 30 | Fill #0

## 2020-02-11 NOTE — Telephone Encounter (Signed)
LMTC x 1  

## 2020-02-11 NOTE — Telephone Encounter (Signed)
Spoke with pt and scheduled lung screening 03/09/20 9:00 CT ordered Nothing further needed

## 2020-02-12 ENCOUNTER — Other Ambulatory Visit: Payer: Self-pay | Admitting: Family Medicine

## 2020-02-12 DIAGNOSIS — E2839 Other primary ovarian failure: Secondary | ICD-10-CM

## 2020-02-12 MED FILL — ROSUVASTATIN CALCIUM 5 MG T: 5 | 30 days supply | Qty: 30 | Fill #0

## 2020-03-03 DIAGNOSIS — I700 Atherosclerosis of aorta: Secondary | ICD-10-CM

## 2020-03-03 HISTORY — DX: Atherosclerosis of aorta: I70.0

## 2020-03-08 ENCOUNTER — Ambulatory Visit: Payer: 59

## 2020-03-09 ENCOUNTER — Ambulatory Visit (INDEPENDENT_AMBULATORY_CARE_PROVIDER_SITE_OTHER)
Admission: RE | Admit: 2020-03-09 | Discharge: 2020-03-09 | Disposition: A | Discharge status: Home / Self Care | Payer: 59 | Source: Ambulatory Visit | Attending: Acute Care | Admitting: Acute Care

## 2020-03-09 ENCOUNTER — Encounter: Payer: Self-pay | Admitting: Acute Care

## 2020-03-09 ENCOUNTER — Other Ambulatory Visit: Payer: Self-pay

## 2020-03-09 ENCOUNTER — Ambulatory Visit (INDEPENDENT_AMBULATORY_CARE_PROVIDER_SITE_OTHER): Payer: 59 | Admitting: Acute Care

## 2020-03-09 DIAGNOSIS — F17210 Nicotine dependence, cigarettes, uncomplicated: Secondary | ICD-10-CM

## 2020-03-09 DIAGNOSIS — Z87891 Personal history of nicotine dependence: Secondary | ICD-10-CM | POA: Diagnosis not present

## 2020-03-09 NOTE — Progress Notes (Signed)
Shared Decision Making Visit Lung Cancer Screening Program [phone])   Eligibility:  Age 64 y.o.  Pack Years Smoking History Calculation 40 pack year smoking history (# packs/per year x # years smoked)  Recent History of coughing up blood  no  Unexplained weight loss? no ( >Than 15 pounds within the last 6 months )  Prior History Lung / other cancer no (Diagnosis within the last 5 years already requiring surveillance chest CT Scans).  Smoking Status Current Smoker  Former Smokers: Years since quit: NA  Quit Date: NA  Visit Components:  Discussion included one or more decision making aids. yes  Discussion included risk/benefits of screening. yes  Discussion included potential follow up diagnostic testing for abnormal scans. yes  Discussion included meaning and risk of over diagnosis. yes  Discussion included meaning and risk of False Positives. yes  Discussion included meaning of total radiation exposure. yes  Counseling Included:  Importance of adherence to annual lung cancer LDCT screening. yes  Impact of comorbidities on ability to participate in the program. yes  Ability and willingness to under diagnostic treatment. yes  Smoking Cessation Counseling:  Current Smokers:   Discussed importance of smoking cessation. yes  Information about tobacco cessation classes and interventions provided to patient. yes  Patient provided with "ticket" for LDCT Scan. yes  Symptomatic Patient. no  Counseling  Diagnosis Code: Tobacco Use Z72.0  Asymptomatic Patient yes  Counseling (Intermediate counseling: > three minutes counseling) [id number]  Former Smokers:   Discussed the importance of maintaining cigarette abstinence. yes  Diagnosis Code: Personal History of Nicotine Dependence. [id number]  Information about tobacco cessation classes and interventions provided to patient. Yes  Patient provided with "ticket" for LDCT Scan. yes  Written Order for Lung Cancer  Screening with LDCT placed in Epic. Yes (CT Chest Lung Cancer Screening Low Dose W/O CM) [id number] Z12.2-Screening of respiratory organs Z87.891-Personal history of nicotine dependence  This visit was virtual. No vital signs were obtained  I have spent 25 minutes of face to face time with Ms. Aseltine discussing the risks and benefits of lung cancer screening. We viewed a power point together that explained in detail the above noted topics. We paused at intervals to allow for questions to be asked and answered to ensure understanding.We discussed that the single most powerful action that she can take to decrease her risk of developing lung cancer is to quit smoking. We discussed whether or not she is ready to commit to setting a quit date. We discussed options for tools to aid in quitting smoking including nicotine replacement therapy, non-nicotine medications, support groups, Quit Smart classes, and behavior modification. We discussed that often times setting smaller, more achievable goals, such as eliminating 1 cigarette a day for a week and then 2 cigarettes a day for a week can be helpful in slowly decreasing the number of cigarettes smoked. This allows for a sense of accomplishment as well as providing a clinical benefit. I gave her the " Be Stronger Than Your Excuses" card with contact information for community resources, classes, free nicotine replacement therapy, and access to mobile apps, text messaging, and on-line smoking cessation help. I have also given her my card and contact information in the event she needs to contact me. We discussed the time and location of the scan, and that either Doroteo Glassman RN or I will call with the results within 24-48 hours of receiving them. I have offered her  a copy of the power point we viewed  as a resource in the event they need reinforcement of the concepts we discussed today in the office. The patient verbalized understanding of all of  the above and had no  further questions upon leaving the office. They have my contact information in the event they have any further questions.  I spent 4 minutes counseling on smoking cessation and the health risks of continued tobacco abuse.  Pt. Was offered a visit with the pharmacist to review all options to enhance smoking cessation success. She declined at this time, but knows she can call us at anytime to schedule an appointment.   I explained to the patient that there has been a high incidence of coronary artery disease noted on these exams. I explained that this is a non-gated exam therefore degree or severity cannot be determined. This patient is not on statin therapy. I have asked the patient to follow-up with their PCP regarding any incidental finding of coronary artery disease and management with diet or medication as their PCP  feels is clinically indicated. The patient verbalized understanding of the above and had no further questions upon completion of the visit.     Magdalen Spatz, NP 03/09/2020 9:25 AM

## 2020-03-09 NOTE — Patient Instructions (Signed)
Thank you for participating in the [place] [facility] Screening Program. It was our pleasure to meet you today. We will call you with the results of your scan within the next few days. Your scan will be assigned a Lung RADS category score by the physicians reading the scans.  This Lung RADS score determines follow up scanning.  See below for description of categories, and follow up screening recommendations. We will be in touch to schedule your follow up screening annually or based on recommendations of our providers. We will fax a copy of your scan results to your Primary Care Physician, or the physician who referred you to the program, to ensure they have the results. Please call the office if you have any questions or concerns regarding your scanning experience or results.  Our office number is [phone]. Please speak with Denise Phelps, RN. She is our Lung Cancer Screening RN. If she is unavailable when you call, please have the office staff send her a message. She will return your call at her earliest convenience. Remember, if your scan is normal, we will scan you annually as long as you continue to meet the criteria for the program. (Age 55-77, Current smoker or smoker who has quit within the last 15 years). If you are a smoker, remember, quitting is the single most powerful action that you can take to decrease your risk of lung cancer and other pulmonary, breathing related problems. We know quitting is hard, and we are here to help.  Please let us know if there is anything we can do to help you meet your goal of quitting. If you are a former smoker, congratulations. We are proud of you! Remain smoke free! Remember you can refer friends or family members through the number above.  We will screen them to make sure they meet criteria for the program. Thank you for helping us take better care of you by participating in Lung Screening.  Lung RADS Categories:  Lung RADS 1: no nodules  or definitely non-concerning nodules.  Recommendation is for a repeat annual scan in 12 months.  Lung RADS 2:  nodules that are non-concerning in appearance and behavior with a very low likelihood of becoming an active cancer. Recommendation is for a repeat annual scan in 12 months.  Lung RADS 3: nodules that are probably non-concerning , includes nodules with a low likelihood of becoming an active cancer.  Recommendation is for a 6-month repeat screening scan. Often noted after an upper respiratory illness. We will be in touch to make sure you have no questions, and to schedule your 6-month scan.  Lung RADS 4 A: nodules with concerning findings, recommendation is most often for a follow up scan in 3 months or additional testing based on our provider's assessment of the scan. We will be in touch to make sure you have no questions and to schedule the recommended 3 month follow up scan.  Lung RADS 4 B:  indicates findings that are concerning. We will be in touch with you to schedule additional diagnostic testing based on our provider's  assessment of the scan.   

## 2020-03-10 ENCOUNTER — Other Ambulatory Visit: Payer: Self-pay | Admitting: *Deleted

## 2020-03-10 DIAGNOSIS — Z87891 Personal history of nicotine dependence: Secondary | ICD-10-CM

## 2020-03-10 DIAGNOSIS — F17210 Nicotine dependence, cigarettes, uncomplicated: Secondary | ICD-10-CM

## 2020-03-10 NOTE — Progress Notes (Signed)
Please call patient and let them  know their  low dose Ct was read as a Lung RADS 2: nodules that are benign in appearance and behavior with a very low likelihood of becoming a clinically active cancer due to size or lack of growth. Recommendation per radiology is for a repeat LDCT in 12 months. .Please let them  know we will order and schedule their  annual screening scan for 03/2021. Please let them  know there was notation of CAD on their  scan.  Please remind the patient  that this is a non-gated exam therefore degree or severity of disease  cannot be determined. Please have them  follow up with their PCP regarding potential risk factor modification, dietary therapy or pharmacologic therapy if clinically indicated. Pt.  is not  currently on statin therapy. Please place order for annual  screening scan for  03/2021 and fax results to PCP. Thanks so much.

## 2020-03-11 MED FILL — FLUoxetine HCL 10 MG CAPS: 10 | 30 days supply | Qty: 30 | Fill #1

## 2020-03-15 ENCOUNTER — Other Ambulatory Visit: Payer: Self-pay

## 2020-03-15 ENCOUNTER — Ambulatory Visit: Payer: 59 | Admitting: Cardiology

## 2020-03-15 ENCOUNTER — Encounter: Payer: Self-pay | Admitting: Cardiology

## 2020-03-15 VITALS — BP 162/94 | HR 63 | Ht 69.0 in | Wt 148.0 lb

## 2020-03-15 DIAGNOSIS — F17200 Nicotine dependence, unspecified, uncomplicated: Secondary | ICD-10-CM | POA: Diagnosis not present

## 2020-03-15 DIAGNOSIS — I2510 Atherosclerotic heart disease of native coronary artery without angina pectoris: Secondary | ICD-10-CM

## 2020-03-15 DIAGNOSIS — R7303 Prediabetes: Secondary | ICD-10-CM

## 2020-03-15 DIAGNOSIS — E785 Hyperlipidemia, unspecified: Secondary | ICD-10-CM

## 2020-03-15 DIAGNOSIS — I10 Essential (primary) hypertension: Secondary | ICD-10-CM | POA: Diagnosis not present

## 2020-03-15 DIAGNOSIS — R931 Abnormal findings on diagnostic imaging of heart and coronary circulation: Secondary | ICD-10-CM | POA: Insufficient documentation

## 2020-03-15 NOTE — Patient Instructions (Addendum)
Medication Instructions:  NO CHANGE *If you need a refill on your cardiac medications before your next appointment, please call your pharmacy*   Lab Work: Not needed  If you have labs (blood work) drawn today and your tests are completely normal, you will receive your results only by: Marland Kitchen MyChart Message (if you have MyChart) OR . A paper copy in the mail If you have any lab test that is abnormal or we need to change your treatment, we will call you to review the results.   Testing/Procedures: WILL BE SCHEDULE AT  300  CT coronary calcium score. This test is done at [address]. This is $150 out of pocket.   Coronary CalciumScan A coronary calcium scan is an imaging test used to look for deposits of calcium and other fatty materials (plaques) in the inner lining of the blood vessels of the heart (coronary arteries). These deposits of calcium and plaques can partly clog and narrow the coronary arteries without producing any symptoms or warning signs. This puts a person at risk for a heart attack. This test can detect these deposits before symptoms develop. Tell a health care provider about:  Any allergies you have.  All medicines you are taking, including vitamins, herbs, eye drops, creams, and over-the-counter medicines.  Any problems you or family members have had with anesthetic medicines.  Any blood disorders you have.  Any surgeries you have had.  Any medical conditions you have.  Whether you are pregnant or may be pregnant. What are the risks? Generally, this is a safe procedure. However, problems may occur, including:  Harm to a pregnant woman and her unborn baby. This test involves the use of radiation. Radiation exposure can be dangerous to a pregnant woman and her unborn baby. If you are pregnant, you generally should not have this procedure done.  Slight increase in the risk of cancer. This is because of the radiation  involved in the test. What happens before the procedure? No preparation is needed for this procedure. What happens during the procedure?  You will undress and remove any jewelry around your neck or chest.  You will put on a hospital gown.  Sticky electrodes will be placed on your chest. The electrodes will be connected to an electrocardiogram (ECG) machine to record a tracing of the electrical activity of your heart.  A CT scanner will take pictures of your heart. During this time, you will be asked to lie still and hold your breath for 2-3 seconds while a picture of your heart is being taken. The procedure may vary among health care providers and hospitals. What happens after the procedure?  You can get dressed.  You can return to your normal activities.  It is up to you to get the results of your test. Ask your health care provider, or the department that is doing the test, when your results will be ready. Summary  A coronary calcium scan is an imaging test used to look for deposits of calcium and other fatty materials (plaques) in the inner lining of the blood vessels of the heart (coronary arteries).  Generally, this is a safe procedure. Tell your health care provider if you are pregnant or may be pregnant.  No preparation is needed for this procedure.  A CT scanner will take pictures of your heart.  You can return to your normal activities after the scan is done. This information is not intended to replace advice  given to you by your health care provider. Make sure you discuss any questions you have with your health care provider. Document Released: 05/17/2008 Document Revised: 10/08/2016 Document Reviewed: 10/08/2016 Elsevier Interactive Patient Education  2017 [place]: At [facility], you and your health needs are our priority.  As part of our continuing mission to provide you with exceptional heart care, we have created designated Provider Care Teams.   These Care Teams include your primary Cardiologist (physician) and Advanced Practice Providers (APPs -  Physician Assistants and Nurse Practitioners) who all work together to provide you with the care you need, when you need it.   Your next appointment:   1 month(s)  The format for your next appointment:   In Person  Provider:   Glenetta Hew, MD   Other Instructions N/A

## 2020-03-15 NOTE — Progress Notes (Signed)
Primary Care Provider: Leighton Ruff, MD  Ashley Robertson at [facility] Cardiologist: Glenetta Hew, MD Pulmonologist: Eric Form, NP  Clinic Note:  Chief Complaint  Patient presents with  . New Admit To SNF    Coronary calcification on CT  . Hyperlipidemia    Poorly controlled    HPI:    Ashley Robertson is a 64 y.o. female long-term smoker with PREDIABETES (A1c 5.7) and HYPERLIPIDEMIA (last TC 279 and LDL 195) who is being seen today for the evaluation of CORONARY ARTERY CALCIFICATION seen on CT scan (along with aortic calcification/atherosclerosis and emphysema) at the request of Leighton Ruff, MD.  [name] was seen on February 11, 2020 by [facility] for routine follow-up and was scheduled for Chest CT scan (done for precancer screening with her history of smoking) -> Indicated 50 pound weight loss with "intermittent fasting ".  No consistent exercise, but takes care of grandkids and stays active.  Has had somewhat labile blood pressure checks running as high as 170/90 but currently in the 1 [id number] range systolic. --> Was started on Crestor 5 mg every other day with plans to increase to daily if tolerated. --> CT scan showed coronary calcification as well as aortic atherosclerosis and emphysema.  Was referred to cardiology when results reviewed.  Recent Hospitalizations: None  Reviewed  CV studies:    The following studies were reviewed today: (if available, images/films reviewed: From Epic Chart or Care Everywhere) . (March 09, 2020) CT Chest: 2 V (LAD & LCx) Coronary Atherosclerosis, Aortic Atherosclerosis (no aneurysm).  Mild centrilobular emphysema with mild diffuse bronchial thickening; several scattered small solitary pulmonary nodules (largest 5.6 mm in anterior left upper lobe) . (10/29/2016) TTE: EF 55 to 60%.  Normal systolic and diastolic function.  No ASD/PFO   Interval History:   Ashley Robertson presents here for cardiology evaluation stating that she  has not had any notable cardiac symptoms.  She does have some exertional dyspnea but as figured this had to do with her long-term smoking.  She does have a smoker's cough in the morning.  She denies any PND orthopnea to suggest any evidence of CHF.  She is relatively active and is caregiver for her grandkids, but denies any active exercise.  CardiovascularReview of Symptoms (Summary): positive for - Some exertional dyspnea but deconditioning and smoking (stable) negative for - chest pain, edema, irregular heartbeat, orthopnea, palpitations, paroxysmal nocturnal dyspnea, rapid heart rate, shortness of breath or Syncope/near syncope, TIA/amaurosis fugax, claudication  The patient does not have symptoms concerning for COVID-19 infection (fever, chills, cough, or new shortness of breath).  The patient is practicing social distancing & Masking.   She completed her second COVID-19 vaccine injection on Friday April 7  REVIEWED OF SYSTEMS   Review of Systems  Constitutional: Positive for weight loss (Roughly 50 pounds since initiating her intermittent fasting diet). Negative for malaise/fatigue.  HENT: Negative for congestion.   Respiratory: Positive for cough. Negative for sputum production and shortness of breath.   Gastrointestinal: Negative for abdominal pain, blood in stool and melena.  Genitourinary: Negative for dysuria and hematuria.  Musculoskeletal: Negative for joint pain.  Neurological: Negative for dizziness, focal weakness, weakness and headaches.  Psychiatric/Behavioral: Negative for memory loss. The patient is not nervous/anxious and does not have insomnia.     I have reviewed and (if needed) personally updated the patient's problem list, medications, allergies, past medical and surgical history, social and family history.   PAST MEDICAL HISTORY  Past Medical History:  Diagnosis Date  . Anxiety and depression   . Aortic atherosclerosis ([place])    Noted on chest CT  . Carotid  artery plaque, bilateral 10/2015   Mild to moderate plaque L>R without significant stenosis  . Coronary artery calcification seen on CAT scan    Noted on chest CT  . Current every day smoker   . Emphysema lung ([place])    Noted on chest CT  . Hyperlipidemia   . Prediabetes     PAST SURGICAL HISTORY   Past Surgical History:  Procedure Laterality Date  . [place]  . RHINOPLASTY  1981  . TONSILLECTOMY  1978    MEDICATIONS/ALLERGIES   Current Meds  Medication Sig  . albuterol (PROVENTIL HFA;VENTOLIN HFA) 108 (90 BASE) MCG/ACT inhaler Inhale 2 puffs into the lungs every 4 (four) hours as needed for wheezing or shortness of breath.  Marland Kitchen aspirin 81 MG tablet Take 81 mg by mouth daily.  . Cholecalciferol (VITAMIN D) 50 MCG (2000 UT) CAPS Take by mouth.  Marland Kitchen FLUoxetine (PROZAC) 20 MG capsule Take 20 mg by mouth daily.  Marland Kitchen MAGNESIUM CITRATE PO Take by mouth.  Marland Kitchen OVER THE COUNTER MEDICATION Reported on 12/27/2015  . rosuvastatin (CRESTOR) 5 MG tablet Take 5 mg by mouth daily. Take 1 tablet every other day.  . [DISCONTINUED] TURMERIC PO Take by mouth.    Allergies  Allergen Reactions  . Lipitor [Atorvastatin] Other (See Comments)    Myalgias    SOCIAL HISTORY/FAMILY HISTORY   Social History   Tobacco Use  . Smoking status: Current Every Day Smoker    Packs/day: 1.00    Years: 40.00    Pack years: 40.00    Types: Cigarettes  . Smokeless tobacco: Never Used  Substance Use Topics  . Alcohol use: Yes    Alcohol/week: 2.0 standard drinks    Types: 2 Glasses of wine per week  . 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  She used to work at [employer].   She is legally separated.  Has 2 daughters.   Smokes roughly 1 pack a day and has been doing so since age 71.   Drinks 4 to 6 cups of coffee a day.      Currently trying Intermittent  Fasting diet for weight loss.  Does not routinely exercise.   Family History  Problem Relation Age of Onset  . Transient ischemic attack Mother 65  . Parkinson's disease Father   . Hyperlipidemia Father   . Hypertension Father   . Cancer Father        Left shoulder  . Healthy Sister   . Healthy Brother   . Healthy Brother   . Healthy Brother   . Hypertension Sister     OBJCTIVE -PE, EKG, labs   Wt Readings from Last 3 Encounters:  03/15/20 148 lb (67.1 kg)  12/27/15 196 lb 9.6 oz (89.2 kg)    Physical Exam: BP (!) 162/94   Pulse 63   Ht 5\' 9"  (1.753 m)   Wt 148 lb (67.1 kg)   SpO2 97%   BMI 21.86 kg/m  --> She tells me at home her blood pressures usually range anywhere from 120/70 up to as high as 170/90, but usually in the 1 [id number] range systolic. Physical Exam  Constitutional:  She is oriented to person, place, and time. She appears well-developed and well-nourished. No distress.  Healthy-appearing woman.  Well-groomed.  Does have mild tobacco odor.  HENT:  Head: Normocephalic and atraumatic.  Eyes: Pupils are equal, round, and reactive to light. EOM are normal.  Neck: No hepatojugular reflux and no JVD present. Carotid bruit is not present. No thyromegaly present.  Cardiovascular: Normal rate, regular rhythm, normal heart sounds and intact distal pulses.  No extrasystoles are present. PMI is not displaced. Exam reveals no gallop and no friction rub.  No murmur heard. Pulmonary/Chest: Effort normal and breath sounds normal. No respiratory distress. She has no wheezes. She has no rales.  Abdominal: Soft. Bowel sounds are normal. She exhibits no distension. There is no abdominal tenderness. There is no rebound.  Musculoskeletal:        General: Deformity (Finger clubbing noted without cyanosis) present. No edema. Normal range of motion.     Cervical back: Normal range of motion and neck supple.  Neurological: She is alert and oriented to person, place, and time. No  cranial nerve deficit.  Skin: Skin is warm and dry.  Psychiatric: She has a normal mood and affect. Her behavior is normal. Judgment and thought content normal.  Vitals reviewed.    Adult ECG Report  Rate: 63 ;  Rhythm: normal sinus rhythm and Left atrial abnormality, otherwise normal axis, intervals and durations.;   Narrative Interpretation: Normal EKG  From PCP: Rate: 67;  Rhythm: normal sinus rhythm and Left atrial abnormality.  Otherwise normal axis, intervals and durations.;   Narrative Interpretation: Normal EKG  Recent Labs: 02/11/2020  Na+ 136, K+ 4.9, Cl- 98, HCO3-29, BUN 26, Cr 0.73, Glu 111, Ca2+ 9.9; AST 16, ALT 11, AlkP 82  TC 279, TG 89, HDL 69, LDL 195; A1c 5.7    ASSESSMENT/PLAN    Problem List Items Addressed This Visit    Essential hypertension (Chronic)    Not sure what to make of her current blood pressure since she says she has a significant wide range at home.  Would like to see the results of her Coronary Calcium Score CT Scan, but suspect that we would need to be little more aggressive with her treatment.  Consider ambulatory blood pressure monitoring.  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  Following ARB and diuretic, next choice would be amlodipine.  Would likely avoid beta-blocker with resting heart rate 60 bpm      Relevant Medications   rosuvastatin (CRESTOR) 5 MG tablet   Hyperlipidemia with target LDL less than 100 (Chronic)    She clear documentation of atherosclerotic disease in her aorta and coronary arteries.  Would like to shoot for an LDL less than 70 but at least less than 100.  Currently is 179.  Was just started on every other day Crestor.  I have asked that she try to increase up to daily dosing.  If tolerated, would push the dose, but suspect that  she may very well need referral to our Clinical Pharmacist run [facility] to potentially consider additional management options.      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  She now has a current CT scan showing emphysema as well as atherosclerotic disease.  Her major risk factor despite high blood pressure and hyperlipidemia is still SMOKING.  While she acknowledges this, she does not seem to be that interested in smoking cessation yet.  I think she is at this point focused on her weight loss and maintaining stable weight.  Once this is done, she would be willing to discuss lipid control.  I did explain to her, that if the coronary CT scan does show significant elevated calcium score, that smoking cessation becomes increasingly more crucial.      Coronary artery calcification seen on CAT scan - Primary (Chronic)    Coronary and aortic atherosclerosis noted on CT scan.  This is not quantified extent of coronary disease, but does suggest the presence of CAD which is not unexpected given her age, smoking, prediabetes and hyperlipidemia as well as hypertension.  I took at least 10 minutes to explain pathophysiology of coronary disease, atherosclerosis and the results of the CT scan.  We discussed negative versus positive remodeling and indications for further testing.  Plan: Quantify CAD risk with Coronary Calcium Score CT--in the absence of active symptoms, would not necessarily proceed with ischemic evaluation unless her coronary calcium score is extremely elevated.  Was just started on a statin for lipids, need to see improvement as her lipids reported controlled.  Labile blood pressure currently not on any medications.  Would like to see what her pressures look like and  follow-up to determine if we need to treat  Smoking cessation counseling provided      Relevant Medications   rosuvastatin (CRESTOR) 5 MG tablet   Other Relevant Orders   EKG 12-Lead (Completed)   CT CARDIAC SCORING   Prediabetes (Chronic)    A1c of 5.7 and 5.8 on recent checks.   Not currently on treatment as yet.  Hopefully with more weight loss this will improve. Does not have high triglycerides by last check, and her weight loss is brought below the "obese" threshold.  She does have elevated blood pressures and therefore is 2 out of the 3 features of metabolic syndrome.  Low threshold to initiate therapy.          COVID-19 Education: The signs and symptoms of COVID-19 were discussed with the patient and how to seek care for testing (follow up with PCP or arrange E-visit).   The importance of social distancing was discussed today.  I spent a total of 22 minutes with the patient. >  50% of the time was spent in direct patient consultation.  Additional time spent with chart review  / charting (studies, outside notes, etc): 16 Total Time: 38 min   Current medicines are reviewed at length with the patient today.  (+/- concerns) none  Notice: This dictation was prepared with Dragon dictation along with smaller phrase technology. Any transcriptional errors that result from this process are unintentional and may not be corrected upon review.  Patient Instructions / Medication Changes & Studies & Tests Ordered   Patient Instructions  Medication Instructions:  NO CHANGE *If you need a refill on your cardiac medications before your next appointment, please call your pharmacy*   Lab Work: Not needed  If you have labs (blood work) drawn today and your tests are completely normal, you will receive your results only by: Marland Kitchen MyChart Message (if you have MyChart) OR . A paper  copy in the mail If you have any lab test that is abnormal or we need to change your treatment, we will call  you to review the results.   Testing/Procedures: WILL BE SCHEDULE AT [place] 300  CT coronary calcium score. This test is done at [address]. This is $150 out of pocket.  Coronary CalciumScan  A coronary calcium scan is an imaging test used to look for deposits of calcium and other fatty materials (plaques) in the inner lining of the blood vessels of the heart (coronary arteries).  Elsevier Interactive Patient Education  2017 [place]: At [facility], you and your health needs are our priority.  As part of our continuing mission to provide you with exceptional heart care, we have created designated Provider Care Teams.  These Care Teams include your primary Cardiologist (physician) and Advanced Practice Providers (APPs -  Physician Assistants and Nurse Practitioners) who all work together to provide you with the care you need, when you need it.   Your next appointment:   1 month(s)  The format for your next appointment:   In Person  Provider:   Glenetta Hew, MD   Other Instructions N/A   Studies Ordered:   Orders Placed This Encounter  Procedures  . CT CARDIAC SCORING  . EKG 12-Lead     Glenetta Hew, M.D., M.S. Interventional Cardiologist   Pager # [phone] Phone # [phone] [address] [place], [place] [address]   Thank you for choosing [facility] at [facility]

## 2020-03-19 ENCOUNTER — Encounter: Payer: Self-pay | Admitting: Cardiology

## 2020-03-19 DIAGNOSIS — I10 Essential (primary) hypertension: Secondary | ICD-10-CM | POA: Insufficient documentation

## 2020-03-19 DIAGNOSIS — E785 Hyperlipidemia, unspecified: Secondary | ICD-10-CM | POA: Insufficient documentation

## 2020-03-19 DIAGNOSIS — R7303 Prediabetes: Secondary | ICD-10-CM | POA: Insufficient documentation

## 2020-03-19 DIAGNOSIS — F17200 Nicotine dependence, unspecified, uncomplicated: Secondary | ICD-10-CM | POA: Insufficient documentation

## 2020-03-19 NOTE — Assessment & Plan Note (Addendum)
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  She now has a current CT scan showing emphysema as well as atherosclerotic disease.  Her major risk factor despite high blood pressure and hyperlipidemia is still SMOKING.  While she acknowledges this, she does not seem to be that interested in smoking cessation yet.  I think she is at this point focused on her weight loss and maintaining stable weight.  Once this is done, she would be willing to discuss lipid control.  I did explain to her, that if the coronary CT scan does show significant elevated calcium score, that smoking cessation becomes increasingly more crucial.

## 2020-03-19 NOTE — Assessment & Plan Note (Signed)
Not sure what to make of her current blood pressure since she says she has a significant wide range at home.  Would like to see the results of her Coronary Calcium Score CT Scan, but suspect that we would need to be little more aggressive with her treatment.  Consider ambulatory blood pressure monitoring.  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  Following ARB and diuretic, next choice would be amlodipine.  Would likely avoid beta-blocker with resting heart rate 60 bpm

## 2020-03-19 NOTE — Assessment & Plan Note (Signed)
She clear documentation of atherosclerotic disease in her aorta and coronary arteries.  Would like to shoot for an LDL less than 70 but at least less than 100.  Currently is 179.  Was just started on every other day Crestor.  I have asked that she try to increase up to daily dosing.  If tolerated, would push the dose, but suspect that she may very well need referral to our Clinical Pharmacist run [facility] to potentially consider additional management options.

## 2020-03-19 NOTE — Assessment & Plan Note (Signed)
A1c of 5.7 and 5.8 on recent checks.   Not currently on treatment as yet.  Hopefully with more weight loss this will improve. Does not have high triglycerides by last check, and her weight loss is brought below the "obese" threshold.  She does have elevated blood pressures and therefore is 2 out of the 3 features of metabolic syndrome.  Low threshold to initiate therapy.

## 2020-03-19 NOTE — Assessment & Plan Note (Addendum)
Coronary and aortic atherosclerosis noted on CT scan.  This is not quantified extent of coronary disease, but does suggest the presence of CAD which is not unexpected given her age, smoking, prediabetes and hyperlipidemia as well as hypertension.  I took at least 10 minutes to explain pathophysiology of coronary disease, atherosclerosis and the results of the CT scan.  We discussed negative versus positive remodeling and indications for further testing.  Plan: Quantify CAD risk with Coronary Calcium Score CT--in the absence of active symptoms, would not necessarily proceed with ischemic evaluation unless her coronary calcium score is extremely elevated.  Was just started on a statin for lipids, need to see improvement as her lipids reported controlled.  Labile blood pressure currently not on any medications.  Would like to see what her pressures look like and follow-up to determine if we need to treat  Smoking cessation counseling provided

## 2020-03-24 ENCOUNTER — Ambulatory Visit (INDEPENDENT_AMBULATORY_CARE_PROVIDER_SITE_OTHER)
Admission: RE | Admit: 2020-03-24 | Discharge: 2020-03-24 | Disposition: A | Discharge status: Home / Self Care | Payer: Self-pay | Source: Ambulatory Visit | Attending: Cardiology | Admitting: Cardiology

## 2020-03-24 ENCOUNTER — Other Ambulatory Visit: Payer: Self-pay

## 2020-03-24 DIAGNOSIS — I2510 Atherosclerotic heart disease of native coronary artery without angina pectoris: Secondary | ICD-10-CM

## 2020-03-31 MED FILL — ROSUVASTATIN CALCIUM 5 MG T: 5 | 30 days supply | Qty: 30 | Fill #1

## 2020-04-06 MED FILL — PEG-3350 SOLUTION: 420 | 1 days supply | Qty: 4000 | Fill #0

## 2020-04-26 ENCOUNTER — Other Ambulatory Visit: Payer: Self-pay

## 2020-04-26 ENCOUNTER — Encounter: Payer: Self-pay | Admitting: Cardiology

## 2020-04-26 ENCOUNTER — Ambulatory Visit: Payer: 59 | Admitting: Cardiology

## 2020-04-26 VITALS — BP 142/90 | HR 69 | Temp 96.3°F | Ht 69.0 in | Wt 147.0 lb

## 2020-04-26 DIAGNOSIS — F17200 Nicotine dependence, unspecified, uncomplicated: Secondary | ICD-10-CM | POA: Diagnosis not present

## 2020-04-26 DIAGNOSIS — R0989 Other specified symptoms and signs involving the circulatory and respiratory systems: Secondary | ICD-10-CM | POA: Insufficient documentation

## 2020-04-26 DIAGNOSIS — E785 Hyperlipidemia, unspecified: Secondary | ICD-10-CM | POA: Diagnosis not present

## 2020-04-26 DIAGNOSIS — I2510 Atherosclerotic heart disease of native coronary artery without angina pectoris: Secondary | ICD-10-CM

## 2020-04-26 DIAGNOSIS — I10 Essential (primary) hypertension: Secondary | ICD-10-CM

## 2020-04-26 MED ORDER — LISINOPRIL 5 MG PO TABS: 5.0000 mg | ORAL_TABLET | Freq: Every day | ORAL | 3 refills | Status: DC

## 2020-04-26 MED FILL — LISINOPRIL 5 MG TABLET: 5 | 90 days supply | Qty: 90 | Fill #0

## 2020-04-26 NOTE — Progress Notes (Signed)
Primary Care Provider: Leighton Ruff, MD  Ashley Robertson at [facility] Cardiologist: Glenetta Hew, MD Pulmonologist: Eric Form, NP  Clinic Note:  Chief Complaint  Patient presents with  . Follow-up    1 month.  Coronary CTA results    HPI:    Ashley Robertson is a 64 y.o. female long-term smoker with PREDIABETES (A1c 5.7) and HYPERLIPIDEMIA (last TC 279 and LDL 195) who is being seen today for FOLLOW-UP evaluation of CORONARY ARTERY CALCIFICATION seen on CT scan (along with aortic calcification/atherosclerosis and emphysema).  She was initially seen at the request of Leighton Ruff, MD on March 16, 2019  TRIXY EVERSON was seen on April 13 for initial consultation.  She indicates she did not have any cardiac symptoms besides some mild exertional dyspnea.  Occasional smoker's cough.  No CHF symptoms.  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  No routine exercise.  Recent Hospitalizations: None  Reviewed  CV studies:    The following studies were reviewed today: (if available, images/films reviewed: From Epic Chart or Care Everywhere) . (March 09, 2020) CT Chest: 2 V (LAD & LCx) Coronary Atherosclerosis, Aortic Atherosclerosis (no aneurysm).  Mild centrilobular emphysema with mild diffuse bronchial thickening; several scattered small solitary pulmonary nodules (largest 5.6 mm in anterior left upper lobe) . (10/29/2016) TTE: EF 55 to 60%.  Normal systolic and diastolic function.  No ASD/PFO   Coronary Calcium Score 79.  LAD and circumflex calcification noted.  Normal ascending aorta with mild calcification.  Interval History:   Ashley Robertson presents here for follow-up of her coronary CTA.  She still only notes stable exertional dyspnea with no exertional chest pain or pressure.  No PND, orthopnea or edema.  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  CardiovascularReview of Symptoms (Summary): positive for - Stable exertional dyspnea likely related to deconditioning and  smoking (stable) negative for - chest pain, edema, irregular heartbeat, orthopnea, palpitations, paroxysmal nocturnal dyspnea, rapid heart rate, shortness of breath or Syncope/near syncope, TIA/amaurosis fugax, claudication   The patient DOES NOT Have symptoms concerning for COVID-19 infection (fever, chills, cough, or new shortness of breath).  The patient is practicing social distancing & Masking.   She completed her second COVID-19 vaccine injection on Friday April 7  REVIEWED OF SYSTEMS   Review of Systems  Constitutional: Negative for malaise/fatigue and weight loss (Roughly 50 pounds since initiating her intermittent fasting diet--now maintaining stable weight).  HENT: Negative for congestion.   Respiratory: Positive for cough. Negative for sputum production and shortness of breath.   Gastrointestinal: Negative for blood in stool and melena.  Genitourinary: Negative for hematuria.  Neurological: Negative for dizziness, focal weakness, weakness and headaches.  Psychiatric/Behavioral: Negative for memory loss. The patient is not nervous/anxious and does not have insomnia.     I have reviewed and (if needed) personally updated the patient's problem list, medications, allergies, past medical and surgical history, social and family history.   PAST MEDICAL HISTORY   Past Medical History:  Diagnosis Date  . Anxiety and depression   . Aortic atherosclerosis ([place])    Noted on chest CT  . Carotid artery plaque, bilateral 10/2015   Mild to moderate plaque L>R without significant stenosis  . Coronary artery calcification seen on CAT scan    Noted on chest CT  . Current every day smoker   . Emphysema lung ([place])    Noted on chest CT  . Hyperlipidemia   . Prediabetes     PAST SURGICAL HISTORY  Past Surgical History:  Procedure Laterality Date  . [place]  . RHINOPLASTY  1981  . TONSILLECTOMY  1978    MEDICATIONS/ALLERGIES   Current Meds  Medication Sig    . albuterol (PROVENTIL HFA;VENTOLIN HFA) 108 (90 BASE) MCG/ACT inhaler Inhale 2 puffs into the lungs every 4 (four) hours as needed for wheezing or shortness of breath.  Marland Kitchen aspirin 81 MG tablet Take 81 mg by mouth daily.  . Cholecalciferol (VITAMIN D) 50 MCG (2000 UT) CAPS Take by mouth.  Marland Kitchen FLUoxetine (PROZAC) 20 MG capsule Take 10 mg by mouth daily.   Marland Kitchen MAGNESIUM CITRATE PO Take by mouth.  Marland Kitchen OVER THE COUNTER MEDICATION Reported on 12/27/2015  . rosuvastatin (CRESTOR) 5 MG tablet Take 5 mg by mouth daily. Take 1 tablet every other day.    Allergies  Allergen Reactions  . Lipitor [Atorvastatin] Other (See Comments)    Myalgias    SOCIAL HISTORY/FAMILY HISTORY   Social History   Tobacco Use  . Smoking status: Current Every Day Smoker    Packs/day: 1.00    Years: 40.00    Pack years: 40.00    Types: Cigarettes  . Smokeless tobacco: Never Used  Substance Use Topics  . Alcohol use: Yes    Alcohol/week: 2.0 standard drinks    Types: 2 Glasses of wine per week  . 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  She used to work at [employer].   She is legally separated.  Has 2 daughters.   Smokes roughly 1 pack a day and has been doing so since age 57.   Drinks 4 to 6 cups of coffee a day.      Currently trying Intermittent Fasting diet for weight loss.  Does not routinely exercise.   Family History  Problem Relation Age of Onset  . Transient ischemic attack Mother 70  . Parkinson's disease Father   . Hyperlipidemia Father   . Hypertension Father   . Cancer Father        Left shoulder  . Healthy Sister   . Healthy Brother   . Healthy Brother   . Healthy Brother   . Hypertension Sister     OBJCTIVE -PE, EKG, labs   Wt Readings from Last 3 Encounters:  04/26/20 147 lb (66.7 kg)  03/15/20 148 lb (67.1 kg)  12/27/15 196 lb 9.6 oz (89.2 kg)     Physical Exam: BP (!) 142/90   Pulse 69   Temp (!) 96.3 F (35.7 C)   Ht 5\' 9"  (1.753 m)   Wt 147 lb (66.7 kg)   BMI 21.71 kg/m  --> She tells me at home her blood pressures usually range anywhere from 120/70 up to as high as 170/90, but usually in the 1 [id number] range systolic. Physical Exam  Constitutional: She is oriented to person, place, and time. She appears well-developed and well-nourished. No distress.  Healthy-appearing woman.  Well-groomed.  Does have mild tobacco odor.  HENT:  Head: Normocephalic and atraumatic.  Neck: No hepatojugular reflux and no JVD present. Carotid bruit is present (Soft bilateral). No thyromegaly present.  Cardiovascular: Normal rate, regular rhythm, normal heart sounds and intact distal pulses.  No extrasystoles are present. PMI is not displaced. Exam reveals no gallop and no friction rub.  No murmur heard. Pulmonary/Chest: Effort normal  and breath sounds normal. No respiratory distress. She has no wheezes. She has no rales.  Musculoskeletal:        General: Deformity (Finger clubbing noted without cyanosis) present. No edema. Normal range of motion.     Cervical back: Normal range of motion and neck supple.  Neurological: She is alert and oriented to person, place, and time.  Psychiatric: She has a normal mood and affect. Her behavior is normal. Judgment and thought content normal.  Vitals reviewed.    Adult ECG Report  Rate: 63 ;  Rhythm: normal sinus rhythm and Left atrial abnormality, otherwise normal axis, intervals and durations.;   Narrative Interpretation: Normal EKG  Recent Labs: 02/11/2020  Na+ 136, K+ 4.9, Cl- 98, HCO3-29, BUN 26, Cr 0.73, Glu 111, Ca2+ 9.9; AST 16, ALT 11, AlkP 82  TC 279, TG 89, HDL 69, LDL 195; A1c 5.7 --> started on rosuvastatin 5 mg every other day.    ASSESSMENT/PLAN    Problem List Items Addressed This Visit    Essential hypertension (Chronic)    She has now had a couple recordings showing elevated  blood pressures consistent with class I-II hypertension.  Plan: Start lisinopril 5 mg daily.      Relevant Medications   lisinopril (ZESTRIL) 5 MG tablet   Hyperlipidemia with target LDL less than 100 (Chronic)    Recently started on 5 mg Crestor.  Plan will be to try to increase it to every day from every other day.  Should be due for blood pressure reevaluation in mid to late summer.  We will have her see CVRR for blood pressure reevaluation in 3 months, if lipids not checked by then, we can check them      Relevant Medications   lisinopril (ZESTRIL) 5 MG tablet   Current every day smoker (Chronic)    Again we talked about smoking cessation very briefly.  She is contemplating but not ready to make any changes.      Coronary artery calcification seen on CAT scan - Primary (Chronic)    Quantification of CAD with coronary calcium score showed score of 79 which is relatively low risk.  She is taking an aspirin daily which may or may not be of any benefit with this low level of coronary calcium score, however with her lipids not unreasonable.  She had restarted on statin which we are increasing to daily dosing and we are starting lisinopril for blood pressure.      Relevant Medications   lisinopril (ZESTRIL) 5 MG tablet   Bilateral carotid bruits    Check carotid Dopplers      Relevant Orders   VAS US CAROTID       COVID-19 Education: The signs and symptoms of COVID-19 were discussed with the patient and how to seek care for testing (follow up with PCP or arrange E-visit).   The importance of social distancing was discussed today.  I spent a total of 18 minutes with the patient. >  50% of the time was spent in direct patient consultation.  Additional time spent with chart review  / charting (studies, outside notes, etc): 8 Total Time: 26 min   Current medicines are reviewed at length with the patient today.  (+/- concerns) none  Notice: This dictation was prepared with  Dragon dictation along with smaller phrase technology. Any transcriptional errors that result from this process are unintentional and may not be corrected upon review.  Patient Instructions / Medication Changes & Studies &  Tests Ordered   Patient Instructions  Medication Instructions:    start taking Lisinopril 5 mg one tablet daily   *If you need a refill on your cardiac medications before your next appointment, please call your pharmacy*   Lab Work: Not needed   Testing/Procedures Will Be Schedule at [facility]  [address]- March or April 2022 Your physician has requested that you have a carotid duplex. This test is an ultrasound of the carotid arteries in your neck. It looks at blood flow through these arteries that supply the brain with blood. Allow one hour for this exam. There are no restrictions or special instructions.    Follow-Up: At [facility], you and your health needs are our priority.  As part of our continuing mission to provide you with exceptional heart care, we have created designated Provider Care Teams.  These Care Teams include your primary Cardiologist (physician) and Advanced Practice Providers (APPs -  Physician Assistants and Nurse Practitioners) who all work together to provide you with the care you need, when you need it.      Your next appointment:   11 month(s) April 2022  The format for your next appointment:   In Person  Provider:   Glenetta Hew, MD   Other Instructions Your physician recommends that you schedule a follow-up appointment in 3 months with [facility] - f/u blood pressure      Studies Ordered:   Orders Placed This Encounter  Procedures  . VAS US CAROTID     Glenetta Hew, M.D., M.S. Interventional Cardiologist   Pager # [phone] Phone # [phone] [address] [place], [place] [address]   Thank you for choosing Heartcare at [facility]!!

## 2020-04-26 NOTE — Patient Instructions (Signed)
Medication Instructions:    start taking Lisinopril 5 mg one tablet daily   *If you need a refill on your cardiac medications before your next appointment, please call your pharmacy*   Lab Work: Not needed   Testing/Procedures Will Be Schedule at [facility]- March or April 2022 Your physician has requested that you have a carotid duplex. This test is an ultrasound of the carotid arteries in your neck. It looks at blood flow through these arteries that supply the brain with blood. Allow one hour for this exam. There are no restrictions or special instructions.    Follow-Up: At [facility], you and your health needs are our priority.  As part of our continuing mission to provide you with exceptional heart care, we have created designated Provider Care Teams.  These Care Teams include your primary Cardiologist (physician) and Advanced Practice Providers (APPs -  Physician Assistants and Nurse Practitioners) who all work together to provide you with the care you need, when you need it.      Your next appointment:   11 month(s) April 2022  The format for your next appointment:   In Person  Provider:   Glenetta Hew, MD   Other Instructions Your physician recommends that you schedule a follow-up appointment in 3 months with [facility] - f/u blood pressure

## 2020-04-29 ENCOUNTER — Other Ambulatory Visit: Payer: Self-pay

## 2020-04-29 ENCOUNTER — Ambulatory Visit
Admission: RE | Admit: 2020-04-29 | Discharge: 2020-04-29 | Disposition: A | Discharge status: Home / Self Care | Payer: 59 | Source: Ambulatory Visit | Attending: Family Medicine | Admitting: Family Medicine

## 2020-04-29 DIAGNOSIS — Z780 Asymptomatic menopausal state: Secondary | ICD-10-CM | POA: Diagnosis not present

## 2020-04-29 DIAGNOSIS — M8589 Other specified disorders of bone density and structure, multiple sites: Secondary | ICD-10-CM | POA: Diagnosis not present

## 2020-04-29 DIAGNOSIS — Z1231 Encounter for screening mammogram for malignant neoplasm of breast: Secondary | ICD-10-CM | POA: Diagnosis not present

## 2020-04-29 DIAGNOSIS — E2839 Other primary ovarian failure: Secondary | ICD-10-CM

## 2020-04-30 MED FILL — ROSUVASTATIN CALCIUM 5 MG T: 5 | 30 days supply | Qty: 30 | Fill #2

## 2020-04-30 MED FILL — FLUoxetine HCL 10 MG CAPS: 10 | 30 days supply | Qty: 30 | Fill #3

## 2020-05-02 ENCOUNTER — Encounter: Payer: Self-pay | Admitting: Cardiology

## 2020-05-02 NOTE — Assessment & Plan Note (Signed)
Recently started on 5 mg Crestor.  Plan will be to try to increase it to every day from every other day.  Should be due for blood pressure reevaluation in mid to late summer.  We will have her see CVRR for blood pressure reevaluation in 3 months, if lipids not checked by then, we can check them

## 2020-05-02 NOTE — Assessment & Plan Note (Signed)
Again we talked about smoking cessation very briefly.  She is contemplating but not ready to make any changes.

## 2020-05-02 NOTE — Assessment & Plan Note (Signed)
Quantification of CAD with coronary calcium score showed score of 79 which is relatively low risk.  She is taking an aspirin daily which may or may not be of any benefit with this low level of coronary calcium score, however with her lipids not unreasonable.  She had restarted on statin which we are increasing to daily dosing and we are starting lisinopril for blood pressure.

## 2020-05-02 NOTE — Assessment & Plan Note (Signed)
She has now had a couple recordings showing elevated blood pressures consistent with class I-II hypertension.  Plan: Start lisinopril 5 mg daily.

## 2020-05-02 NOTE — Assessment & Plan Note (Signed)
Check carotid Dopplers °

## 2020-05-06 MED FILL — ALBUTEROL SULFATE HFA 108 (: 108 (90 BAS | 25 days supply | Qty: 18 | Fill #0

## 2020-05-19 DIAGNOSIS — Z1159 Encounter for screening for other viral diseases: Secondary | ICD-10-CM | POA: Diagnosis not present

## 2020-05-19 DIAGNOSIS — E559 Vitamin D deficiency, unspecified: Secondary | ICD-10-CM | POA: Diagnosis not present

## 2020-05-19 DIAGNOSIS — E7800 Pure hypercholesterolemia, unspecified: Secondary | ICD-10-CM | POA: Diagnosis not present

## 2020-05-31 MED FILL — ROSUVASTATIN CALCIUM 5 MG T: 5 | 90 days supply | Qty: 90 | Fill #0

## 2020-05-31 MED FILL — FLUoxetine HCL 10 MG CAPS: 10 | 90 days supply | Qty: 90 | Fill #0

## 2020-06-01 ENCOUNTER — Other Ambulatory Visit (HOSPITAL_COMMUNITY): Payer: Self-pay | Admitting: Family Medicine

## 2020-06-07 DIAGNOSIS — Z1159 Encounter for screening for other viral diseases: Secondary | ICD-10-CM | POA: Diagnosis not present

## 2020-06-10 DIAGNOSIS — K6389 Other specified diseases of intestine: Secondary | ICD-10-CM | POA: Diagnosis not present

## 2020-06-10 DIAGNOSIS — D124 Benign neoplasm of descending colon: Secondary | ICD-10-CM | POA: Diagnosis not present

## 2020-06-10 DIAGNOSIS — Z1211 Encounter for screening for malignant neoplasm of colon: Secondary | ICD-10-CM | POA: Diagnosis not present

## 2020-06-10 DIAGNOSIS — K635 Polyp of colon: Secondary | ICD-10-CM | POA: Diagnosis not present

## 2020-06-10 DIAGNOSIS — K621 Rectal polyp: Secondary | ICD-10-CM | POA: Diagnosis not present

## 2020-06-10 DIAGNOSIS — D120 Benign neoplasm of cecum: Secondary | ICD-10-CM | POA: Diagnosis not present

## 2020-06-10 DIAGNOSIS — K559 Vascular disorder of intestine, unspecified: Secondary | ICD-10-CM | POA: Diagnosis not present

## 2020-06-10 DIAGNOSIS — D123 Benign neoplasm of transverse colon: Secondary | ICD-10-CM | POA: Diagnosis not present

## 2020-06-10 DIAGNOSIS — K633 Ulcer of intestine: Secondary | ICD-10-CM | POA: Diagnosis not present

## 2020-07-19 ENCOUNTER — Other Ambulatory Visit: Payer: Self-pay

## 2020-07-19 ENCOUNTER — Ambulatory Visit (INDEPENDENT_AMBULATORY_CARE_PROVIDER_SITE_OTHER): Payer: 59 | Admitting: Pharmacist Clinician (PhC)/ Clinical Pharmacy Specialist

## 2020-07-19 VITALS — BP 132/84 | HR 62 | Resp 16 | Ht 69.0 in | Wt 144.6 lb

## 2020-07-19 DIAGNOSIS — I10 Essential (primary) hypertension: Secondary | ICD-10-CM | POA: Diagnosis not present

## 2020-07-19 DIAGNOSIS — F17200 Nicotine dependence, unspecified, uncomplicated: Secondary | ICD-10-CM

## 2020-07-19 DIAGNOSIS — E785 Hyperlipidemia, unspecified: Secondary | ICD-10-CM | POA: Diagnosis not present

## 2020-07-19 MED ORDER — LISINOPRIL 10 MG PO TABS: 10.0000 mg | ORAL_TABLET | Freq: Every day | ORAL | 3 refills | Status: DC

## 2020-07-19 NOTE — Assessment & Plan Note (Addendum)
Patient with essential hypertension, not quite to goal.  Will increase lisinopril from 5 mg to 10 mg daily and repeat metabolic panel in 2 weeks.  She is to check home BP readings twice daily, at least 3-4 days each week.

## 2020-07-19 NOTE — Progress Notes (Signed)
07/20/2020 Ashley Robertson [birth year] [id number]   HPI:  MARGURETE GUAMAN is a 64 y.o. female patient of Dr Ellyn Hack, with a PMH below who presents today for hypertension clinic evaluation.  At her last visit with Dr. Ellyn Hack in May, her pressure was 142/90.   He started her on lisinopril 5 mg daily and asked that she monitor home blood pressure.  She has not had any concerns about taking the lisinopril, and no adverse effects.  She does note that she increased her rosuvastatin to 5 mg daily this past May, and is tolerating that as well.  Patient also notes that she has gone to an intermittent fasting diet (she only eats within a 6 hour window each day) and has lost about 50 pounds.    Past Medical History: hyperlipidemia On rosuvastatin 5 mg qod  ASCVD Coronary calcium score 79 - 80th percentile  Pre-diabetes 5.7 on last measure.    Tobacco abuse Currently 1 ppd smoker, not interested in cessation at this time     Blood Pressure Goal:  130/80  Current Medications: lisinopril 5 mg   Family Hx: father with htn, hld, died at 55 Parkinsons; mother healthy at 39, no meds; 1 sister htn since 40's well controlled. 2 daughters, neither with issues  Social Hx: current smoker 1 ppd, wine 1-2 glasses most days; coffee all day; occasional diet coke  Diet: mostly home cooked, no added salt; fruits and vegetabls; spinach salad with salmon, chicken  Exercise: no regular exercise  Home BP readings: none  Intolerances: atorvastatin caused myalgias, started within first week of trying  Labs: 3/21:  Na 136, K 4.9, Glu 111, BUN 26, SCr 0.73  Wt Readings from Last 3 Encounters:  07/19/20 144 lb 9.6 oz (65.6 kg)  04/26/20 147 lb (66.7 kg)  03/15/20 148 lb (67.1 kg)   BP Readings from Last 3 Encounters:  07/19/20 132/84  04/26/20 (!) 142/90  03/15/20 (!) 162/94   Pulse Readings from Last 3 Encounters:  07/19/20 62  04/26/20 69  03/15/20 63    Current Outpatient Medications  Medication  Sig Dispense Refill  . albuterol (PROVENTIL HFA;VENTOLIN HFA) 108 (90 BASE) MCG/ACT inhaler Inhale 2 puffs into the lungs every 4 (four) hours as needed for wheezing or shortness of breath.  0  . aspirin 81 MG tablet Take 81 mg by mouth daily.    . Cholecalciferol (VITAMIN D) 50 MCG (2000 UT) CAPS Take by mouth.    Marland Kitchen FLUoxetine (PROZAC) 20 MG capsule Take 10 mg by mouth daily.     Marland Kitchen MAGNESIUM CITRATE PO Take by mouth.    Marland Kitchen OVER THE COUNTER MEDICATION Serrapeptase 120,000 units dailyReported on 12/27/2015    . rosuvastatin (CRESTOR) 5 MG tablet Take 5 mg by mouth daily. Take 1 tablet every other day.    . lisinopril (ZESTRIL) 10 MG tablet Take 1 tablet (10 mg total) by mouth daily. 90 tablet 3   No current facility-administered medications for this visit.    Allergies  Allergen Reactions  . Lipitor [Atorvastatin] Other (See Comments)    Myalgias    Past Medical History:  Diagnosis Date  . Anxiety and depression   . Aortic atherosclerosis ([place])    Noted on chest CT  . Carotid artery plaque, bilateral 10/2015   Mild to moderate plaque L>R without significant stenosis  . Coronary artery calcification seen on CAT scan    Noted on chest CT  . Current every day smoker   .  Emphysema lung ([place])    Noted on chest CT  . Hyperlipidemia   . Prediabetes     Blood pressure 132/84, pulse 62, resp. rate 16, height 5\' 9"  (1.753 m), weight 144 lb 9.6 oz (65.6 kg), SpO2 93 %.  Essential hypertension Patient with essential hypertension, not quite to goal.  Will increase lisinopril from 5 mg to 10 mg daily and repeat metabolic panel in 2 weeks.  She is to check home BP readings twice daily, at least 3-4 days each week.    Current every day smoker Patient currently smoking 1 ppd.  She is aware of the dangers of continued smoking, but not interested in quitting at this time.  She is aware that we have options to help her quit when she is ready to make this decision.   Hyperlipidemia with target LDL  less than 100 Now on rosuvastatin daily since May.  Will repeat lipid labs in 2 weeks, when comes to the office for metabolic panel    Tommy Medal PharmD CPP [place] [address] [place], [place] [address] [phone]

## 2020-07-19 NOTE — Patient Instructions (Signed)
Return for a a follow up appointment Sept 28 at 8 am  Go to the lab in 2 weeks (about Sept 1) for kidney function and cholesterol - please be fasting  Check your blood pressure at home twice a day, about 3-4 days per week and keep record of the readings.  Take your BP meds as follows:  Increase lisinopril to 10 mg once daily  Bring all of your meds, your BP cuff and your record of home blood pressures to your next appointment.  Exercise as youre able, try to walk approximately 30 minutes per day.  Keep salt intake to a minimum, especially watch canned and prepared boxed foods.  Eat more fresh fruits and vegetables and fewer canned items.  Avoid eating in fast food restaurants.    HOW TO TAKE YOUR BLOOD PRESSURE:  Rest 5 minutes before taking your blood pressure.   Dont smoke or drink caffeinated beverages for at least 30 minutes before.  Take your blood pressure before (not after) you eat.  Sit comfortably with your back supported and both feet on the floor (dont cross your legs).  Elevate your arm to heart level on a table or a desk.  Use the proper sized cuff. It should fit smoothly and snugly around your bare upper arm. There should be enough room to slip a fingertip under the cuff. The bottom edge of the cuff should be 1 inch above the crease of the elbow.  Ideally, take 3 measurements at one sitting and record the average.

## 2020-07-20 ENCOUNTER — Encounter: Payer: Self-pay | Admitting: Pharmacist Clinician (PhC)/ Clinical Pharmacy Specialist

## 2020-07-20 NOTE — Assessment & Plan Note (Signed)
Now on rosuvastatin daily since May.  Will repeat lipid labs in 2 weeks, when comes to the office for metabolic panel

## 2020-07-20 NOTE — Assessment & Plan Note (Signed)
Patient currently smoking 1 ppd.  She is aware of the dangers of continued smoking, but not interested in quitting at this time.  She is aware that we have options to help her quit when she is ready to make this decision.

## 2020-07-21 MED FILL — LISINOPRIL 10 MG TABS: 10 | 90 days supply | Qty: 90 | Fill #0

## 2020-08-03 DIAGNOSIS — I10 Essential (primary) hypertension: Secondary | ICD-10-CM | POA: Diagnosis not present

## 2020-08-03 LAB — COMPREHENSIVE METABOLIC PANEL
ALT: 10 IU/L (ref 0–32)
AST: 19 IU/L (ref 0–40)
Albumin/Globulin Ratio: 1.4 (ref 1.2–2.2)
Albumin: 4.1 g/dL (ref 3.8–4.8)
Alkaline Phosphatase: 88 IU/L (ref 48–121)
BUN/Creatinine Ratio: 14 (ref 12–28)
BUN: 10 mg/dL (ref 8–27)
Bilirubin Total: 0.4 mg/dL (ref 0.0–1.2)
CO2: 25 mmol/L (ref 20–29)
Calcium: 9.8 mg/dL (ref 8.7–10.3)
Chloride: 96 mmol/L (ref 96–106)
Creatinine, Ser: 0.7 mg/dL (ref 0.57–1.00)
GFR calc Af Amer: 106 mL/min/{1.73_m2} (ref >59)
GFR calc non Af Amer: 92 mL/min/{1.73_m2} (ref >59)
Globulin, Total: 2.9 g/dL (ref 1.5–4.5)
Glucose: 102 mg/dL — ABNORMAL HIGH (ref 65–99)
Potassium: 5.5 mmol/L — ABNORMAL HIGH (ref 3.5–5.2)
Sodium: 136 mmol/L (ref 134–144)
Total Protein: 7 g/dL (ref 6.0–8.5)

## 2020-08-03 LAB — LIPID PANEL
Chol/HDL Ratio: 2.5 ratio (ref 0.0–4.4)
Cholesterol, Total: 169 mg/dL (ref 100–199)
HDL: 68 mg/dL (ref >39)
LDL Chol Calc (NIH): 90 mg/dL (ref 0–99)
Triglycerides: 56 mg/dL (ref 0–149)
VLDL Cholesterol Cal: 11 mg/dL (ref 5–40)

## 2020-08-24 MED FILL — FLUoxetine HCL 10 MG CAPS: 10 | 90 days supply | Qty: 90 | Fill #0

## 2020-08-30 ENCOUNTER — Other Ambulatory Visit: Payer: Self-pay

## 2020-08-30 ENCOUNTER — Ambulatory Visit (INDEPENDENT_AMBULATORY_CARE_PROVIDER_SITE_OTHER): Payer: 59 | Admitting: Pharmacist

## 2020-08-30 VITALS — BP 138/90 | HR 78 | Resp 16 | Ht 69.0 in | Wt 144.8 lb

## 2020-08-30 DIAGNOSIS — I10 Essential (primary) hypertension: Secondary | ICD-10-CM

## 2020-08-30 MED ORDER — LISINOPRIL 5 MG PO TABS: 5.0000 mg | ORAL_TABLET | Freq: Every day | ORAL | 1 refills | Status: DC

## 2020-08-30 MED ORDER — AMLODIPINE BESYLATE 5 MG PO TABS: 5.0000 mg | ORAL_TABLET | Freq: Every day | ORAL | 1 refills | Status: DC

## 2020-08-30 MED FILL — LISINOPRIL 5 MG TABLET: 5 | 30 days supply | Qty: 30 | Fill #0

## 2020-08-30 NOTE — Patient Instructions (Addendum)
Return for a  follow up appointment in 5-6 weeks  Go to the lab in 1 weeks  Check your blood pressure at home daily (if able) and keep record of the readings.  Take your BP meds as follows: *DECREASE lisinopril to 5mg  every morning* *START taking amlodipine 5mg  every evening  Bring all of your meds, your BP cuff and your record of home blood pressures to your next appointment.  Exercise as you're able, try to walk approximately 30 minutes per day.  Keep salt intake to a minimum, especially watch canned and prepared boxed foods.  Eat more fresh fruits and vegetables and fewer canned items.  Avoid eating in fast food restaurants.    HOW TO TAKE YOUR BLOOD PRESSURE: . Rest 5 minutes before taking your blood pressure. .  Don't smoke or drink caffeinated beverages for at least 30 minutes before. . Take your blood pressure before (not after) you eat. . Sit comfortably with your back supported and both feet on the floor (don't cross your legs). . Elevate your arm to heart level on a table or a desk. . Use the proper sized cuff. It should fit smoothly and snugly around your bare upper arm. There should be enough room to slip a fingertip under the cuff. The bottom edge of the cuff should be 1 inch above the crease of the elbow. . Ideally, take 3 measurements at one sitting and record the average.

## 2020-08-30 NOTE — Progress Notes (Signed)
HPI:  Ashley Robertson is a 64 y.o. female patient of Dr Ellyn Hack, with a PMH below who presents today for hypertension clinic follow up.  At her last visit with Dr. Ellyn Hack in May, her pressure was 142/90.   He started her on lisinopril 5 mg daily and asked that she monitor home blood pressure.  During last OV we increased lisinopril to 10mg  daily.  Patient also notes that she has gone to an intermittent fasting diet (she only eats within a 6 hour window each day) and has lost about 55 pounds.  She denies dizziness, swelling, cough, chest pain, headaches, or increased fatigue. Noted her blood pressure drops significantly in the evenings, but she wakes up with VERY high BP readings. Also noted, increase K and Dr Allison Quarry recommendation to decrease lisinopril or substitute for amlodipine.  Past Medical History: hyperlipidemia On rosuvastatin 5 mg qod  ASCVD Coronary calcium score 79 - 80th percentile  Pre-diabetes 5.7 on last measure.    Tobacco abuse Currently 1 ppd smoker, not interested in cessation at this time     Blood Pressure Goal:  130/80  Current Medications: lisinopril 10mg  daily  Family Hx: father with htn, hld, died at 84 Parkinsons; mother healthy at 3, no meds; 1 sister htn since 48's well controlled. 2 daughters, neither with issues  Social Hx: current smoker 1 ppd, wine 1-2 glasses most days; coffee all day; occasional diet coke  Diet: mostly home cooked, no added salt; fruits and vegetabls; spinach salad with salmon, chicken  Exercise: no regular exercise  Home BP readings:  17 morning readings; average 159/92 (range 124-191/66-92); HR 58-76 bpm 13 afternoon readings; average 122/76 (range 100-146/63-92); HR 61-110 bpm  Intolerances: atorvastatin caused myalgias, started within first week of trying  Labs:  08/30/2020: Na 136, K 5.5, Glu 102, BUN 10, Scr 0.70 02/2020:  Na 136, K 4.9, Glu 111, BUN 26, SCr 0.73  Wt Readings from Last 3 Encounters:  08/30/20 144 lb  12.8 oz (65.7 kg)  07/19/20 144 lb 9.6 oz (65.6 kg)  04/26/20 147 lb (66.7 kg)   BP Readings from Last 3 Encounters:  08/30/20 138/90  07/19/20 132/84  04/26/20 (!) 142/90   Pulse Readings from Last 3 Encounters:  08/30/20 78  07/19/20 62  04/26/20 69    Current Outpatient Medications  Medication Sig Dispense Refill  . albuterol (PROVENTIL HFA;VENTOLIN HFA) 108 (90 BASE) MCG/ACT inhaler Inhale 2 puffs into the lungs every 4 (four) hours as needed for wheezing or shortness of breath.  0  . aspirin 81 MG tablet Take 81 mg by mouth daily.    . Cholecalciferol (VITAMIN D) 50 MCG (2000 UT) CAPS Take by mouth.    Marland Kitchen FLUoxetine (PROZAC) 20 MG capsule Take 10 mg by mouth daily.     Marland Kitchen lisinopril (ZESTRIL) 5 MG tablet Take 1 tablet (5 mg total) by mouth daily. 30 tablet 1  . MAGNESIUM CITRATE PO Take by mouth.    Marland Kitchen OVER THE COUNTER MEDICATION Serrapeptase 120,000 units dailyReported on 12/27/2015    . rosuvastatin (CRESTOR) 5 MG tablet Take 5 mg by mouth daily. Take 1 tablet every day.    Marland Kitchen amLODipine (NORVASC) 5 MG tablet Take 1 tablet (5 mg total) by mouth at bedtime. 30 tablet 1   No current facility-administered medications for this visit.    Allergies  Allergen Reactions  . Lipitor [Atorvastatin] Other (See Comments)    Myalgias    Past Medical History:  Diagnosis  Date  . Anxiety and depression   . Aortic atherosclerosis ([place])    Noted on chest CT  . Carotid artery plaque, bilateral 10/2015   Mild to moderate plaque L>R without significant stenosis  . Coronary artery calcification seen on CAT scan    Noted on chest CT  . Current every day smoker   . Emphysema lung ([place])    Noted on chest CT  . Hyperlipidemia   . Prediabetes     Blood pressure 138/90, pulse 78, resp. rate 16, height 5\' 9"  (1.753 m), weight 144 lb 12.8 oz (65.7 kg), SpO2 93 %.  Essential hypertension Blood pressure remains above desired goal, but greatly improved since initiating lisinopril. Noted  elevated potassium since increasing lisinopril from 5mg  to 10mg  daily. Morning blood pressure continues to be elevated as well.  Will decrease lisinopril back to 5mg  daily and add amlodipine 5mg  daily in the evening. Plan to increase hydration and repeat BMET in 2 weeks. Patient will follow up with HTN clinic in 5 weeks.   Zeeva Courser Rodriguez-Guzman PharmD, BCPS, [facility] [address] [place] [address] 09/06/2020 11:19 AM

## 2020-08-31 ENCOUNTER — Other Ambulatory Visit (HOSPITAL_COMMUNITY): Payer: Self-pay | Admitting: Family Medicine

## 2020-08-31 ENCOUNTER — Telehealth: Payer: Self-pay | Admitting: Cardiology

## 2020-08-31 MED ORDER — AMLODIPINE BESYLATE 5 MG PO TABS
5.0000 mg | ORAL_TABLET | Freq: Every day | ORAL | 1 refills | Status: DC
Start: 2020-08-31 — End: 2020-10-04

## 2020-08-31 MED FILL — AMLODIPINE BESYLATE 5 MG TA: 5 | 30 days supply | Qty: 30 | Fill #0

## 2020-08-31 MED FILL — ROSUVASTATIN CALCIUM 5 MG T: 5 | 90 days supply | Qty: 90 | Fill #0

## 2020-08-31 NOTE — Telephone Encounter (Signed)
Prescription resent to pharmacy.  I spoke with pharmacy and confirmed they received prescription.  They will call patient and let her know when it is ready for pick up.

## 2020-08-31 NOTE — Telephone Encounter (Signed)
Pt c/o medication issue:  1. Name of Medication: amLODipine (NORVASC) 5 MG tablet  2. How are you currently taking this medication (dosage and times per day)? Has not started yet  3. Are you having a reaction (difficulty breathing--STAT)? no  4. What is your medication issue? Patient states the pharmacy did not receive the prescription. She states she is supposed to start the medication today and it needs to be sent to [facility].

## 2020-09-02 ENCOUNTER — Other Ambulatory Visit (HOSPITAL_COMMUNITY): Payer: Self-pay | Admitting: Family Medicine

## 2020-09-02 MED FILL — FLUoxetine HCL 20 MG CAPS: 20 | 90 days supply | Qty: 90 | Fill #0

## 2020-09-05 ENCOUNTER — Other Ambulatory Visit (HOSPITAL_COMMUNITY): Payer: Self-pay | Admitting: Dentistry

## 2020-09-05 MED FILL — AMOX-CLAV 875-125 MG TABLET: 875-125 | 7 days supply | Qty: 14 | Fill #0

## 2020-09-06 ENCOUNTER — Encounter: Payer: Self-pay | Admitting: Pharmacist

## 2020-09-06 NOTE — Assessment & Plan Note (Addendum)
Blood pressure remains above desired goal, but greatly improved since initiating lisinopril. Noted elevated potassium since increasing lisinopril from 5mg  to 10mg  daily. Morning blood pressure continues to be elevated as well.  Will decrease lisinopril back to 5mg  daily and add amlodipine 5mg  daily in the evening. Plan to increase hydration and repeat BMET in 2 weeks. Patient will follow up with HTN clinic in 5 weeks.

## 2020-09-08 DIAGNOSIS — I10 Essential (primary) hypertension: Secondary | ICD-10-CM | POA: Diagnosis not present

## 2020-09-10 ENCOUNTER — Telehealth: Payer: Self-pay | Admitting: Internal Medicine

## 2020-09-10 ENCOUNTER — Other Ambulatory Visit: Payer: Self-pay

## 2020-09-10 ENCOUNTER — Ambulatory Visit (HOSPITAL_COMMUNITY)
Admission: EM | Admit: 2020-09-10 | Discharge: 2020-09-10 | Disposition: A | Discharge status: Home / Self Care | Payer: 59 | Attending: Family Medicine | Admitting: Family Medicine

## 2020-09-10 ENCOUNTER — Other Ambulatory Visit (HOSPITAL_COMMUNITY): Payer: Self-pay | Admitting: Family Medicine

## 2020-09-10 ENCOUNTER — Encounter (HOSPITAL_COMMUNITY): Payer: Self-pay

## 2020-09-10 DIAGNOSIS — I10 Essential (primary) hypertension: Secondary | ICD-10-CM

## 2020-09-10 DIAGNOSIS — E875 Hyperkalemia: Secondary | ICD-10-CM | POA: Insufficient documentation

## 2020-09-10 LAB — BASIC METABOLIC PANEL
Anion gap: 13 (ref 5–15)
BUN/Creatinine Ratio: 15 (ref 12–28)
BUN: 11 mg/dL (ref 8–27)
BUN: 9 mg/dL (ref 8–23)
CO2: 24 mmol/L (ref 20–29)
CO2: 25 mmol/L (ref 22–32)
Calcium: 10.1 mg/dL (ref 8.7–10.3)
Calcium: 9.7 mg/dL (ref 8.9–10.3)
Chloride: 93 mmol/L — ABNORMAL LOW (ref 96–106)
Chloride: 96 mmol/L — ABNORMAL LOW (ref 98–111)
Creatinine, Ser: 0.72 mg/dL (ref 0.57–1.00)
Creatinine, Ser: 0.64 mg/dL (ref 0.44–1.00)
GFR calc Af Amer: 102 mL/min/{1.73_m2} (ref >59)
GFR calc non Af Amer: 89 mL/min/{1.73_m2} (ref >59)
GFR, Estimated: >60 mL/min (ref >60)
Glucose, Bld: 108 mg/dL — ABNORMAL HIGH (ref 70–99)
Glucose: 102 mg/dL — ABNORMAL HIGH (ref 65–99)
Potassium: 6 mmol/L (ref 3.5–5.2)
Potassium: 5.2 mmol/L — ABNORMAL HIGH (ref 3.5–5.1)
Sodium: 135 mmol/L (ref 134–144)
Sodium: 134 mmol/L — ABNORMAL LOW (ref 135–145)

## 2020-09-10 MED ORDER — HYDROCHLOROTHIAZIDE 25 MG PO TABS: 25.0000 mg | ORAL_TABLET | Freq: Every day | ORAL | 0 refills | Status: DC

## 2020-09-10 MED FILL — HYDROCHLOROTHIAZIDE 25 MG T: 25 | 30 days supply | Qty: 30 | Fill #0

## 2020-09-10 NOTE — ED Triage Notes (Signed)
Patient in requesting to have potassium level checked after receiving call from MD this morning stating critical K level result of 6 that was drawn on thursday. K was checked last month with value of 5.5  Patient taking magnesium citrate daily. Also has been intermittent fasting for over a year. Eating within a 4-6 hour window daily.   Denies n/v, sob, cp,   EKG ordered, completed.

## 2020-09-10 NOTE — ED Provider Notes (Signed)
[facility]    CSN: [id number] Arrival date & time: 09/10/20  1004      History   Chief Complaint Chief Complaint  Patient presents with   Labs Only    HPI Ashley Robertson is a 64 y.o. female.   Here today at the request of her PCP for recheck of her elevated potassium to 6.0 drawn at PCP office yesterday. States her potassium seems to be creeping up since starting low dose lisinopril several months ago. Recently increased from 5 mg - 10 mg and this is when the level became critically high. Also noted a dry cough since increase. Denies CP, SOB, flank pain, urinary changes, fevers. Feels at baseline health. Of note, eats lots of fresh produce and takes mag citrate daily and has for several years.      Past Medical History:  Diagnosis Date   Anxiety and depression    Aortic atherosclerosis ([place])    Noted on chest CT   Carotid artery plaque, bilateral 10/2015   Mild to moderate plaque L>R without significant stenosis   Coronary artery calcification seen on CAT scan    Noted on chest CT   Current every day smoker    Emphysema lung ([facility])    Noted on chest CT   Hyperlipidemia    Prediabetes     Patient Active Problem List   Diagnosis Date Noted   Bilateral carotid bruits 04/26/2020   Essential hypertension 03/19/2020   Hyperlipidemia with target LDL less than 100 03/19/2020   Prediabetes 03/19/2020   Current every day smoker 03/19/2020   Coronary artery calcification seen on CAT scan 03/15/2020    Past Surgical History:  Procedure Laterality Date   CESAREAN SECTION  1990, [place]    OB History   No obstetric history on file.      Home Medications    Prior to Admission medications   Medication Sig Start Date End Date Taking? Authorizing Provider  albuterol (PROVENTIL HFA;VENTOLIN HFA) 108 (90 BASE) MCG/ACT inhaler Inhale 2 puffs into the lungs every 4 (four) hours as needed for wheezing or  shortness of breath. 10/26/15  Yes Melony Overly, MD  amLODipine (NORVASC) 5 MG tablet Take 1 tablet (5 mg total) by mouth at bedtime. 08/31/20  Yes Leonie Man, MD  aspirin 81 MG tablet Take 81 mg by mouth daily.   Yes [provider]  Cholecalciferol (VITAMIN D) 50 MCG (2000 UT) CAPS Take by mouth.   Yes [provider]  FLUoxetine (PROZAC) 20 MG capsule Take 10 mg by mouth daily.    Yes [provider]  rosuvastatin (CRESTOR) 5 MG tablet Take 5 mg by mouth daily. Take 1 tablet every day.   Yes [provider]  lisinopril (ZESTRIL) 5 MG tablet Take 1 tablet (5 mg total) by mouth daily. 08/30/20 09/10/20 Yes Leonie Man, MD  hydrochlorothiazide (HYDRODIURIL) 25 MG tablet Take 1 tablet (25 mg total) by mouth daily. 09/10/20   Volney American, PA-C  OVER THE COUNTER MEDICATION Serrapeptase 120,000 units dailyReported on 12/27/2015    [provider]    Family History Family History  Problem Relation Age of Onset   Transient ischemic attack Mother 71   Parkinson's disease Father    Hyperlipidemia Father    Hypertension Father    Cancer Father        Left shoulder   Healthy Sister    Healthy  Brother    Healthy Brother    Healthy Brother    Hypertension Sister     Social History Social History   Tobacco Use   Smoking status: Current Every Day Smoker    Packs/day: 1.00    Years: 40.00    Pack years: 40.00    Types: Cigarettes   Smokeless tobacco: Never Used  Substance Use Topics   Alcohol use: Yes    Alcohol/week: 2.0 standard drinks    Types: 2 Glasses of wine per week   Drug use: No     Allergies   Lipitor [atorvastatin]   Review of Systems Review of Systems PER HPI   Physical Exam Triage Vital Signs ED Triage Vitals [09/10/20 1042]  Enc Vitals Group     BP (!) 152/83     Pulse Rate (!) 58     Resp      Temp 98.1 F (36.7 C)     Temp Source Oral     SpO2 96 %     Weight      Height        Head Circumference      Peak Flow      Pain Score 0     Pain Loc      Pain Edu?      Excl. in [place]?    No data found.  Updated Vital Signs BP (!) 152/83 (BP Location: Right Arm)    Pulse (!) 58    Temp 98.1 F (36.7 C) (Oral)    SpO2 96%   Visual Acuity Right Eye Distance:   Left Eye Distance:   Bilateral Distance:    Right Eye Near:   Left Eye Near:    Bilateral Near:     Physical Exam Vitals and nursing note reviewed.  Constitutional:      Appearance: Normal appearance. She is not ill-appearing.  HENT:     Head: Atraumatic.  Eyes:     Extraocular Movements: Extraocular movements intact.     Conjunctiva/sclera: Conjunctivae normal.  Cardiovascular:     Rate and Rhythm: Normal rate and regular rhythm.     Heart sounds: Normal heart sounds.  Pulmonary:     Effort: Pulmonary effort is normal.     Breath sounds: Normal breath sounds.  Abdominal:     General: Bowel sounds are normal. There is no distension.     Palpations: Abdomen is soft.     Tenderness: There is no abdominal tenderness.  Musculoskeletal:        General: Normal range of motion.     Cervical back: Normal range of motion and neck supple.  Skin:    General: Skin is warm and dry.     Findings: No rash.  Neurological:     Mental Status: She is alert and oriented to person, place, and time.  Psychiatric:        Mood and Affect: Mood normal.        Thought Content: Thought content normal.        Judgment: Judgment normal.      UC Treatments / Results  Labs (all labs ordered are listed, but only abnormal results are displayed) Labs Reviewed  BASIC METABOLIC PANEL    EKG   Radiology No results found.  Procedures Procedures (including critical care time)  Medications Ordered in UC Medications - No data to display  Initial Impression / Assessment and Plan / UC Course  I have reviewed the triage vital signs and the  nursing notes.  Pertinent labs & imaging results that were available  during my care of the patient were reviewed by me and considered in my medical decision making (see chart for details).     Well appearing, EKG NSR without any T wave changes or other abnormalities today. BP slightly elevated, HR slightly bradycardic but overall vitals and exam very stable. She is feeling at baseline health. PCP notes reviewed, previous labs reviewed. Will recheck bmp, d/c lisinopril and mag citrate and start HCTZ. She will monitor home BPs twice daily and f/u with PCP in the next 1-2 weeks for recheck. Discussed ER precautions in meantime.   Final Clinical Impressions(s) / UC Diagnoses   Final diagnoses:  Hyperkalemia  Essential hypertension     Discharge Instructions     Follow up with your Primary Care provider in the next 2 weeks for blood pressure and lab recheck    ED Prescriptions    Medication Sig Dispense Auth. Provider   hydrochlorothiazide (HYDRODIURIL) 25 MG tablet Take 1 tablet (25 mg total) by mouth daily. 30 tablet Volney American, [place]     PDMP not reviewed this encounter.   Volney American, [place] 09/10/20 1154

## 2020-09-10 NOTE — Discharge Instructions (Signed)
Follow up with your Primary Care provider in the next 2 weeks for blood pressure and lab recheck

## 2020-09-10 NOTE — Telephone Encounter (Signed)
Received call overnight from [place] [facility] about patient's recent BMP.  The most notable finding was hyperkalemia with a K of 6.0.  Called patient this morning to give her the results and recommended that she go to urgent care for follow-up labs, potential treatment, and adjustment of her lisinopril as needed.    For this could potentially hold lisinopril altogether and increase her amlodipine to 10 mg daily to compensate, until she can follow-up with her outpatient cardiology team.  Patient voiced understanding and all questions were answered.

## 2020-09-12 NOTE — Addendum Note (Signed)
Addended by: Harrington Challenger on: 09/12/2020 08:25 AM   Modules accepted: Orders

## 2020-09-22 ENCOUNTER — Other Ambulatory Visit: Payer: Self-pay | Admitting: *Deleted

## 2020-09-22 DIAGNOSIS — E875 Hyperkalemia: Secondary | ICD-10-CM

## 2020-09-22 DIAGNOSIS — I10 Essential (primary) hypertension: Secondary | ICD-10-CM | POA: Diagnosis not present

## 2020-09-22 LAB — BASIC METABOLIC PANEL
BUN/Creatinine Ratio: 22 (ref 12–28)
BUN: 13 mg/dL (ref 8–27)
CO2: 33 mmol/L — ABNORMAL HIGH (ref 20–29)
Calcium: 9.9 mg/dL (ref 8.7–10.3)
Chloride: 87 mmol/L — ABNORMAL LOW (ref 96–106)
Creatinine, Ser: 0.6 mg/dL (ref 0.57–1.00)
GFR calc Af Amer: 111 mL/min/{1.73_m2} (ref >59)
GFR calc non Af Amer: 97 mL/min/{1.73_m2} (ref >59)
Glucose: 102 mg/dL — ABNORMAL HIGH (ref 65–99)
Potassium: 4.9 mmol/L (ref 3.5–5.2)
Sodium: 130 mmol/L — ABNORMAL LOW (ref 134–144)

## 2020-09-28 MED FILL — AMLODIPINE BESYLATE 5 MG TA: 5 | 30 days supply | Qty: 30 | Fill #1

## 2020-10-01 ENCOUNTER — Other Ambulatory Visit: Payer: Self-pay | Admitting: Family Medicine

## 2020-10-04 ENCOUNTER — Ambulatory Visit (INDEPENDENT_AMBULATORY_CARE_PROVIDER_SITE_OTHER): Payer: PPO | Admitting: Pharmacist

## 2020-10-04 ENCOUNTER — Other Ambulatory Visit: Payer: Self-pay

## 2020-10-04 VITALS — BP 140/88 | HR 60 | Resp 17 | Ht 69.0 in | Wt 152.8 lb

## 2020-10-04 DIAGNOSIS — E875 Hyperkalemia: Secondary | ICD-10-CM

## 2020-10-04 DIAGNOSIS — I10 Essential (primary) hypertension: Secondary | ICD-10-CM

## 2020-10-04 MED ORDER — AMLODIPINE BESYLATE 5 MG PO TABS
10.0000 mg | ORAL_TABLET | Freq: Every day | ORAL | 1 refills | Status: DC
Start: 2020-10-04 — End: 2020-10-17

## 2020-10-04 NOTE — Progress Notes (Signed)
HPI:  TRENITA HULME is a 64 y.o. female patient of Dr Ellyn Hack, with a PMH below who presents today for hypertension clinic follow up. Patient presents for HTN follow up.  She denies dizziness, swelling, cough, chest pain, headaches, or increased fatigue. Since last OV her lisinopril was discontinued ,on 09/08/2020, due to hyperkalemia. BP management continued with amlodipine 5mg  and HCTZ 25mg  daily. Repeat BMEt showed potassium down to 4.9 , but noted sodium slightly decreased to 130.    Past Medical History: hyperlipidemia On rosuvastatin 5 mg qod  ASCVD Coronary calcium score 79 - 80th percentile  Pre-diabetes 5.7 on last measure.    Tobacco abuse Currently 1 ppd smoker, not interested in cessation at this time     Blood Pressure Goal:  130/80  Current Medications:  amlodipine 5mg  daily (5-6 in PM) HCTZ 25mg  daily (morning)  Family Hx: father with htn, hld, died at 58 Parkinsons; mother healthy at 29, no meds; 1 sister htn since 64's well controlled. 2 daughters, neither with issues  Social Hx: current smoker 1 ppd, wine 1-2 glasses most days; coffee all day; occasional diet coke  Diet: mostly home cooked, no added salt; fruits and vegetabls; spinach salad with salmon, chicken  Exercise: no regular exercise  Home BP readings:  29 readings; average 134/74 (HR range 53-118)  Intolerances: atorvastatin caused myalgias, started within first week of trying  Labs:  09/22/20: Na 130, K 4.9, Glu 102, BUN 13, Scr 0.60 09/10/2020: Na 134, K 5.2,Glu 108, BUN 9, Scr 0.64  Wt Readings from Last 3 Encounters:  10/04/20 152 lb 12.8 oz (69.3 kg)  08/30/20 144 lb 12.8 oz (65.7 kg)  07/19/20 144 lb 9.6 oz (65.6 kg)   BP Readings from Last 3 Encounters:  10/04/20 140/88  09/10/20 (!) 152/83  08/30/20 138/90   Pulse Readings from Last 3 Encounters:  10/04/20 60  09/10/20 (!) 58  08/30/20 78    Current Outpatient Medications  Medication Sig Dispense Refill  . albuterol (PROVENTIL  HFA;VENTOLIN HFA) 108 (90 BASE) MCG/ACT inhaler Inhale 2 puffs into the lungs every 4 (four) hours as needed for wheezing or shortness of breath.  0  . amLODipine (NORVASC) 5 MG tablet Take 2 tablets (10 mg total) by mouth at bedtime. 30 tablet 1  . aspirin 81 MG tablet Take 81 mg by mouth daily.    . Cholecalciferol (VITAMIN D) 50 MCG (2000 UT) CAPS Take by mouth.    Marland Kitchen FLUoxetine (PROZAC) 20 MG capsule Take 10 mg by mouth daily.     . hydrochlorothiazide (HYDRODIURIL) 25 MG tablet Take 1 tablet (25 mg total) by mouth daily. 30 tablet 0  . magnesium citrate SOLN Take 1 Bottle by mouth once.    Marland Kitchen OVER THE COUNTER MEDICATION Serrapeptase 120,000 units dailyReported on 12/27/2015    . rosuvastatin (CRESTOR) 5 MG tablet Take 5 mg by mouth daily. Take 1 tablet every day.     No current facility-administered medications for this visit.    Allergies  Allergen Reactions  . Lipitor [Atorvastatin] Other (See Comments)    Myalgias    Past Medical History:  Diagnosis Date  . Anxiety and depression   . Aortic atherosclerosis ([place])    Noted on chest CT  . Carotid artery plaque, bilateral 10/2015   Mild to moderate plaque L>R without significant stenosis  . Coronary artery calcification seen on CAT scan    Noted on chest CT  . Current every day smoker   .  Emphysema lung ([place])    Noted on chest CT  . Hyperlipidemia   . Prediabetes     Blood pressure 140/88, pulse 60, resp. rate 17, height 5\' 9"  (1.753 m), weight 152 lb 12.8 oz (69.3 kg), SpO2 91 %.  Essential hypertension Blood pressure remains slightly above goal, but hyperkalemia resolved. Noted her Na was at 130 on most recent BMET. Will increase amlodipine to 10mg  daily, repeat BMET in 4 weeks, and continue stable diet (no need to decrease dietary sodium). Follow up scheduled for December.   Shellyann Wandrey Rodriguez-Guzman PharmD, BCPS, [facility] [address] [place] [address] 10/04/2020 4:30 PM

## 2020-10-04 NOTE — Assessment & Plan Note (Signed)
Blood pressure remains slightly above goal, but hyperkalemia resolved. Noted her Na was at 130 on most recent BMET. Will increase amlodipine to 10mg  daily, repeat BMET in 4 weeks, and continue stable diet (no need to decrease dietary sodium). Follow up scheduled for December.

## 2020-10-04 NOTE — Patient Instructions (Signed)
Return for a  follow up appointment in December/2021  Go to the lab in 4 weeks  Check your blood pressure at home daily (if able) and keep record of the readings.  Take your BP meds as follows: INCREASE amlodipine to 10mg  every evening  Bring all of your meds, your BP cuff and your record of home blood pressures to your next appointment.  Exercise as you're able, try to walk approximately 30 minutes per day.  Keep salt intake to a minimum, especially watch canned and prepared boxed foods.  Eat more fresh fruits and vegetables and fewer canned items.  Avoid eating in fast food restaurants.    HOW TO TAKE YOUR BLOOD PRESSURE: . Rest 5 minutes before taking your blood pressure. .  Don't smoke or drink caffeinated beverages for at least 30 minutes before. . Take your blood pressure before (not after) you eat. . Sit comfortably with your back supported and both feet on the floor (don't cross your legs). . Elevate your arm to heart level on a table or a desk. . Use the proper sized cuff. It should fit smoothly and snugly around your bare upper arm. There should be enough room to slip a fingertip under the cuff. The bottom edge of the cuff should be 1 inch above the crease of the elbow. . Ideally, take 3 measurements at one sitting and record the average.

## 2020-10-06 ENCOUNTER — Other Ambulatory Visit: Payer: Self-pay | Admitting: Cardiology

## 2020-10-06 ENCOUNTER — Telehealth: Payer: Self-pay | Admitting: Cardiology

## 2020-10-06 ENCOUNTER — Other Ambulatory Visit: Payer: Self-pay | Admitting: Family Medicine

## 2020-10-06 MED ORDER — HYDROCHLOROTHIAZIDE 25 MG PO TABS
25.0000 mg | ORAL_TABLET | Freq: Every day | ORAL | 6 refills | Status: DC
Start: 2020-10-06 — End: 2020-12-13

## 2020-10-06 MED FILL — HYDROCHLOROTHIAZIDE 25 MG T: 25 | 30 days supply | Qty: 30 | Fill #0

## 2020-10-06 NOTE — Telephone Encounter (Signed)
Pt c/o medication issue:  1. Name of Medication: hydrochlorothiazide (HYDRODIURIL) 25 MG tablet  2. How are you currently taking this medication (dosage and times per day)? As written  3. Are you having a reaction (difficulty breathing--STAT)? No   4. What is your medication issue? Pt needs a prescription sent in into [place], [place]

## 2020-10-06 NOTE — Telephone Encounter (Signed)
Spoke with patient. Patient's prescription sent to pharmacy per request. Patient verbalized understanding.

## 2020-10-15 ENCOUNTER — Other Ambulatory Visit: Payer: Self-pay | Admitting: Cardiology

## 2020-10-18 ENCOUNTER — Other Ambulatory Visit: Payer: Self-pay | Admitting: Cardiology

## 2020-10-18 MED ORDER — AMLODIPINE BESYLATE 5 MG PO TABS
5.0000 mg | ORAL_TABLET | Freq: Every day | ORAL | 7 refills | Status: DC
Start: 2020-10-18 — End: 2020-10-19

## 2020-10-18 NOTE — Telephone Encounter (Signed)
  Pt c/o medication issue:  1. Name of Medication: amLODipine (NORVASC) 5 MG tablet  2. How are you currently taking this medication (dosage and times per day)? 10 mg daily   3. Are you having a reaction (difficulty breathing--STAT)? No  4. What is your medication issue? PT said Raquel increased the dosage from 5 mg to 10 mg at her last visit on 10/04/20. The patient has been taking 2 5mg  tablets but is out. The insurance says it is too soon to refill the 5 mg tablets so she will need a new rx of the 10 mg tablets sent to her pharmacy    *STAT* If patient is at the pharmacy, call can be transferred to refill team.   1. Which medications need to be refilled? (please list name of each medication and dose if known)  amLODipine (NORVASC) 5 MG tablet  2. Which pharmacy/location (including street and city if local pharmacy) is medication to be sent to? [place], [place]  3. Do they need a 30 day or 90 day supply? 30 with refills   Pt is out of medication

## 2020-10-19 ENCOUNTER — Other Ambulatory Visit: Payer: Self-pay | Admitting: Cardiology

## 2020-10-19 MED ORDER — AMLODIPINE BESYLATE 10 MG PO TABS
10.0000 mg | ORAL_TABLET | Freq: Every day | ORAL | 1 refills | Status: DC
Start: 2020-10-19 — End: 2020-12-14

## 2020-10-19 MED FILL — AMLODIPINE BESYLATE 10 MG T: 10 | 30 days supply | Qty: 30 | Fill #0

## 2020-10-19 NOTE — Telephone Encounter (Signed)
*  STAT* If patient is at the pharmacy, call can be transferred to refill team.   1. Which medications need to be refilled? (please list name of each medication and dose if known)  amLODipine (NORVASC) 5 MG tablet  2. Which pharmacy/location (including street and city if local pharmacy) is medication to be sent to? [place], [place]  3. Do they need a 30 day or 90 day supply?  30 days   Patient states that Raquel the pharmacist switched her amLODipine (NORVASC) 10 mg and told her to just take 2 of her 5 MG tablet until she ran out - insurance is not allowing her to refill her 5 mg prescription early so she states she needs a new one written for the 10 mg so she can get it refilled. Patient has been out of medication since 10/16/2020. Please call if unable process request.

## 2020-10-19 NOTE — Telephone Encounter (Signed)
Rx sent to pharmacy   

## 2020-10-31 MED FILL — HYDROCHLOROTHIAZIDE 25 MG T: 25 | 30 days supply | Qty: 30 | Fill #1

## 2020-11-03 DIAGNOSIS — E875 Hyperkalemia: Secondary | ICD-10-CM | POA: Diagnosis not present

## 2020-11-04 LAB — BASIC METABOLIC PANEL
BUN/Creatinine Ratio: 15 (ref 12–28)
BUN: 10 mg/dL (ref 8–27)
CO2: 28 mmol/L (ref 20–29)
Calcium: 9.7 mg/dL (ref 8.7–10.3)
Chloride: 91 mmol/L — ABNORMAL LOW (ref 96–106)
Creatinine, Ser: 0.66 mg/dL (ref 0.57–1.00)
GFR calc Af Amer: 107 mL/min/{1.73_m2} (ref >59)
GFR calc non Af Amer: 93 mL/min/{1.73_m2} (ref >59)
Glucose: 96 mg/dL (ref 65–99)
Potassium: 4.4 mmol/L (ref 3.5–5.2)
Sodium: 137 mmol/L (ref 134–144)

## 2020-11-07 ENCOUNTER — Encounter: Payer: Self-pay | Admitting: Pharmacist Clinician (PhC)/ Clinical Pharmacy Specialist

## 2020-11-07 ENCOUNTER — Ambulatory Visit (INDEPENDENT_AMBULATORY_CARE_PROVIDER_SITE_OTHER): Payer: PPO | Admitting: Pharmacist Clinician (PhC)/ Clinical Pharmacy Specialist

## 2020-11-07 ENCOUNTER — Other Ambulatory Visit: Payer: Self-pay

## 2020-11-07 DIAGNOSIS — I10 Essential (primary) hypertension: Secondary | ICD-10-CM

## 2020-11-07 NOTE — Assessment & Plan Note (Signed)
Patient with essential hypertension, with elevated readings in the mornings, but excellent evening readings.  We discussed the option of switching hydrochlorothiazide to chlorthalidone for better 24 hour control, but patient feels that hctz is the reason for her evening numbers being in range and does not wish to change that at this time.  We had a long discussion about medication options.  For now, patient is going to check her BP at home more frequently in the mornings to try and determine if this is an AM surge or just the need to switch hctz to a longer acting medication.  She should call the office in 1-2 weeks with her blood pressure readings.

## 2020-11-07 NOTE — Progress Notes (Unsigned)
HPI:  Ashley Robertson is a 64 y.o. female patient of Dr Ellyn Hack, with a PMH below who presents today for hypertension clinic follow up. Patient presents for HTN follow up.  She denies dizziness, swelling, cough, chest pain, headaches, or increased fatigue. Since last OV her lisinopril was discontinued ,on 09/08/2020, due to hyperkalemia. BP management continued with amlodipine 5mg  and HCTZ 25mg  daily. Repeat BMEt showed potassium down to 4.9 , but noted sodium slightly decreased to 130.  At her last visit on Nov 2, BP was 140/88, amlodipine was increased to 10 mg daily.    Today patient returns for follow up.  Her most recent metabolic panel shows improvement, with sodium up to 137 and SCR at 0.66.  She has been feeling well, although is frustrated, as her home blood pressure readings have not changed significantly since increasing amlodipine.  Of note, however, her evening readings are quite good, it's just the morning numbers that are not as low.   Past Medical History: hyperlipidemia On rosuvastatin 5 mg qod  ASCVD Coronary calcium score 79 - 80th percentile  Pre-diabetes 5.7 on last measure.    Tobacco abuse Currently 1 ppd smoker, not interested in cessation at this time     Blood Pressure Goal:  130/80  Current Medications:  amlodipine 5mg  daily (evening) HCTZ 25mg  daily (morning)  Family Hx: father with htn, hld, died at 81 Parkinsons; mother healthy at 9, no meds; 1 sister htn since 53's well controlled. 2 daughters, neither with issues  Social Hx: current smoker 1 ppd, wine 1-2 glasses most days; coffee all day; occasional diet coke  Diet: mostly home cooked, no added salt; fruits and vegetabls; spinach salad with salmon, chicken  Exercise: no regular exercise  Home BP readings:  15 morning readings average 144/81 13 evening readings average 122/75  Intolerances: atorvastatin caused myalgias, started within first week of trying  Labs:  11/03/20:  Na 137, K 4.4, Glu 96,  BUN 10, SCr 0.66. GFR 93 09/22/20: Na 130, K 4.9, Glu 102, BUN 13, Scr 0.60 GFR 97 09/10/2020: Na 134, K 5.2,Glu 108, BUN 9, Scr 0.64 GFR 89  Wt Readings from Last 3 Encounters:  11/07/20 147 lb 9.6 oz (67 kg)  10/04/20 152 lb 12.8 oz (69.3 kg)  08/30/20 144 lb 12.8 oz (65.7 kg)   BP Readings from Last 3 Encounters:  11/07/20 128/72  10/04/20 140/88  09/10/20 (!) 152/83   Pulse Readings from Last 3 Encounters:  11/07/20 60  10/04/20 60  09/10/20 (!) 58    Current Outpatient Medications  Medication Sig Dispense Refill  . albuterol (PROVENTIL HFA;VENTOLIN HFA) 108 (90 BASE) MCG/ACT inhaler Inhale 2 puffs into the lungs every 4 (four) hours as needed for wheezing or shortness of breath.  0  . amLODipine (NORVASC) 10 MG tablet Take 1 tablet (10 mg total) by mouth at bedtime. 30 tablet 1  . aspirin 81 MG tablet Take 81 mg by mouth daily.    . Cholecalciferol (VITAMIN D) 50 MCG (2000 UT) CAPS Take by mouth.    Marland Kitchen FLUoxetine (PROZAC) 20 MG capsule Take 10 mg by mouth daily.     . hydrochlorothiazide (HYDRODIURIL) 25 MG tablet Take 1 tablet (25 mg total) by mouth daily. 30 tablet 6  . OVER THE COUNTER MEDICATION Serrapeptase 120,000 units dailyReported on 12/27/2015    . rosuvastatin (CRESTOR) 5 MG tablet Take 5 mg by mouth daily. Take 1 tablet every day.    Marland Kitchen Specialty Vitamins  Products (MAGNESIUM, AMINO ACID CHELATE,) 133 MG tablet Take 1 tablet by mouth 2 (two) times daily. PURE magnesium supplement - 120 mg of mag per capsule     No current facility-administered medications for this visit.    Allergies  Allergen Reactions  . Lisinopril Cough  . Lipitor [Atorvastatin] Other (See Comments)    Myalgias    Past Medical History:  Diagnosis Date  . Anxiety and depression   . Aortic atherosclerosis ([place])    Noted on chest CT  . Carotid artery plaque, bilateral 10/2015   Mild to moderate plaque L>R without significant stenosis  . Coronary artery calcification seen on CAT scan     Noted on chest CT  . Current every day smoker   . Emphysema lung ([place])    Noted on chest CT  . Hyperlipidemia   . Prediabetes     Blood pressure 128/72, pulse 60, height 5\' 9"  (1.753 m), weight 147 lb 9.6 oz (67 kg).  Essential hypertension Patient with essential hypertension, with elevated readings in the mornings, but excellent evening readings.  We discussed the option of switching hydrochlorothiazide to chlorthalidone for better 24 hour control, but patient feels that hctz is the reason for her evening numbers being in range and does not wish to change that at this time.  We had a long discussion about medication options.  For now, patient is going to check her BP at home more frequently in the mornings to try and determine if this is an AM surge or just the need to switch hctz to a longer acting medication.  She should call the office in 1-2 weeks with her blood pressure readings.     Tommy Medal PharmD CPP [facility] [address] [place] [address] 11/07/2020 1:28 PM

## 2020-11-07 NOTE — Patient Instructions (Signed)
  Check your blood pressure in the morning when you get up then once an hour for 2-3 hours.  Average each time frame after a week or two and let's see if your pressure is coming down right away or it's taking a few hours.  Call us with the information at [phone] (Renalda Locklin/Raquel)  Take your BP meds as follows:  Continue with current medications  Bring all of your meds, your BP cuff and your record of home blood pressures to your next appointment.  Exercise as you're able, try to walk approximately 30 minutes per day.  Keep salt intake to a minimum, especially watch canned and prepared boxed foods.  Eat more fresh fruits and vegetables and fewer canned items.  Avoid eating in fast food restaurants.    HOW TO TAKE YOUR BLOOD PRESSURE: . Rest 5 minutes before taking your blood pressure. .  Don't smoke or drink caffeinated beverages for at least 30 minutes before. . Take your blood pressure before (not after) you eat. . Sit comfortably with your back supported and both feet on the floor (don't cross your legs). . Elevate your arm to heart level on a table or a desk. . Use the proper sized cuff. It should fit smoothly and snugly around your bare upper arm. There should be enough room to slip a fingertip under the cuff. The bottom edge of the cuff should be 1 inch above the crease of the elbow. . Ideally, take 3 measurements at one sitting and record the average.

## 2020-11-15 ENCOUNTER — Other Ambulatory Visit (HOSPITAL_COMMUNITY): Payer: Self-pay | Admitting: Family Medicine

## 2020-11-15 MED FILL — ALBUTEROL SULFATE HFA 108 (: 108 (90 BAS | 25 days supply | Qty: 18 | Fill #0

## 2020-11-15 MED FILL — AMLODIPINE BESYLATE 10 MG T: 10 | 30 days supply | Qty: 30 | Fill #1

## 2020-12-05 MED FILL — HYDROCHLOROTHIAZIDE 25 MG T: 25 | 30 days supply | Qty: 30 | Fill #2

## 2020-12-05 MED FILL — ROSUVASTATIN CALCIUM 5 MG T: 5 | 60 days supply | Qty: 60 | Fill #1

## 2020-12-05 MED FILL — FLUoxetine HCL 20 MG CAPS: 20 | 90 days supply | Qty: 90 | Fill #1

## 2020-12-12 ENCOUNTER — Telehealth: Payer: Self-pay

## 2020-12-12 NOTE — Telephone Encounter (Signed)
Received a message from the patient 12/12/20 who had a Pharm visit on 11/07/20.    She would like to speak with you, please assist.  She did not mention what her call pertained to.

## 2020-12-13 ENCOUNTER — Other Ambulatory Visit: Payer: Self-pay | Admitting: Cardiology

## 2020-12-13 ENCOUNTER — Telehealth: Payer: Self-pay | Admitting: Pharmacist Clinician (PhC)/ Clinical Pharmacy Specialist

## 2020-12-13 MED ORDER — CHLORTHALIDONE 25 MG PO TABS: 25.0000 mg | ORAL_TABLET | Freq: Every day | ORAL | 5 refills | Status: DC

## 2020-12-13 MED FILL — CHLORTHALIDONE 25 MG TABS: 25 | 30 days supply | Qty: 30 | Fill #0

## 2020-12-13 NOTE — Telephone Encounter (Signed)
Spoke with patient, BP still somewhat elevated in mornings, will switch hctz to chlorthalidone 25 mg qd

## 2020-12-13 NOTE — Telephone Encounter (Signed)
Patient called to note that her home BP readings continue to be elevated in the mornings, not dropping down to 130's until at least noon.  Suspected at her last visit that hydrochlorothiazide was not lasting a full day for her and thus the high and low readings.  Will switch her to chlorthalidone 25 mg once daily for better 24 hr control.  Patient agreeable to plan, and will call to let us know if any problems.

## 2020-12-14 ENCOUNTER — Other Ambulatory Visit: Payer: Self-pay | Admitting: Cardiology

## 2020-12-14 MED FILL — AMLODIPINE BESYLATE 10 MG T: 10 | 30 days supply | Qty: 30 | Fill #0

## 2021-01-12 MED FILL — AMLODIPINE BESYLATE 10 MG T: 10 | 30 days supply | Qty: 30 | Fill #1

## 2021-01-12 MED FILL — CHLORTHALIDONE 25 MG TABS: 25 | 30 days supply | Qty: 30 | Fill #1

## 2021-02-04 MED FILL — ROSUVASTATIN CALCIUM 5 MG T: 5 | 30 days supply | Qty: 30 | Fill #0

## 2021-02-04 MED FILL — CHLORTHALIDONE 25 MG TABS: 25 | 30 days supply | Qty: 30 | Fill #2

## 2021-02-21 ENCOUNTER — Other Ambulatory Visit (HOSPITAL_COMMUNITY): Payer: Self-pay | Admitting: Family Medicine

## 2021-02-21 DIAGNOSIS — R7303 Prediabetes: Secondary | ICD-10-CM | POA: Diagnosis not present

## 2021-02-21 DIAGNOSIS — F3341 Major depressive disorder, recurrent, in partial remission: Secondary | ICD-10-CM | POA: Diagnosis not present

## 2021-02-21 DIAGNOSIS — I10 Essential (primary) hypertension: Secondary | ICD-10-CM | POA: Diagnosis not present

## 2021-02-21 DIAGNOSIS — E7800 Pure hypercholesterolemia, unspecified: Secondary | ICD-10-CM | POA: Diagnosis not present

## 2021-02-21 DIAGNOSIS — D229 Melanocytic nevi, unspecified: Secondary | ICD-10-CM | POA: Diagnosis not present

## 2021-02-21 DIAGNOSIS — Z716 Tobacco abuse counseling: Secondary | ICD-10-CM | POA: Diagnosis not present

## 2021-02-21 DIAGNOSIS — R053 Chronic cough: Secondary | ICD-10-CM | POA: Diagnosis not present

## 2021-02-21 DIAGNOSIS — Z7141 Alcohol abuse counseling and surveillance of alcoholic: Secondary | ICD-10-CM | POA: Diagnosis not present

## 2021-02-21 DIAGNOSIS — F17210 Nicotine dependence, cigarettes, uncomplicated: Secondary | ICD-10-CM | POA: Diagnosis not present

## 2021-02-21 DIAGNOSIS — Z0000 Encounter for general adult medical examination without abnormal findings: Secondary | ICD-10-CM | POA: Diagnosis not present

## 2021-02-21 DIAGNOSIS — Z23 Encounter for immunization: Secondary | ICD-10-CM | POA: Diagnosis not present

## 2021-02-21 MED FILL — FLUoxetine HCL 20 MG CAPS: 20 | 90 days supply | Qty: 90 | Fill #0

## 2021-02-22 ENCOUNTER — Other Ambulatory Visit: Payer: Self-pay | Admitting: Cardiology

## 2021-02-22 MED FILL — AMLODIPINE BESYLATE 10 MG T: 10 | 30 days supply | Qty: 30 | Fill #0

## 2021-02-27 MED FILL — ROSUVASTATIN CALCIUM 5 MG T: 5 | 90 days supply | Qty: 90 | Fill #0

## 2021-02-28 ENCOUNTER — Other Ambulatory Visit (HOSPITAL_COMMUNITY): Payer: Self-pay | Admitting: Optometrist

## 2021-02-28 DIAGNOSIS — H2513 Age-related nuclear cataract, bilateral: Secondary | ICD-10-CM | POA: Diagnosis not present

## 2021-02-28 DIAGNOSIS — H00021 Hordeolum internum right upper eyelid: Secondary | ICD-10-CM | POA: Diagnosis not present

## 2021-02-28 MED FILL — DOXYCYCLINE HYCLATE 100 MG: 100 | 10 days supply | Qty: 20 | Fill #0

## 2021-03-07 DIAGNOSIS — H00021 Hordeolum internum right upper eyelid: Secondary | ICD-10-CM | POA: Diagnosis not present

## 2021-03-07 DIAGNOSIS — H2513 Age-related nuclear cataract, bilateral: Secondary | ICD-10-CM | POA: Diagnosis not present

## 2021-03-10 ENCOUNTER — Other Ambulatory Visit (HOSPITAL_COMMUNITY): Payer: Self-pay

## 2021-03-10 MED FILL — Chlorthalidone Tab 25 MG: ORAL | 30 days supply | Qty: 30 | Fill #0 | Status: AC

## 2021-03-10 MED FILL — Amlodipine Besylate Tab 10 MG (Base Equivalent): ORAL | 30 days supply | Qty: 30 | Fill #0 | Status: CN

## 2021-03-13 ENCOUNTER — Other Ambulatory Visit (HOSPITAL_COMMUNITY): Payer: Self-pay

## 2021-03-13 MED FILL — Amlodipine Besylate Tab 10 MG (Base Equivalent): ORAL | 30 days supply | Qty: 30 | Fill #0 | Status: CN

## 2021-03-20 ENCOUNTER — Ambulatory Visit (HOSPITAL_COMMUNITY)
Admission: RE | Admit: 2021-03-20 | Discharge: 2021-03-20 | Disposition: A | Discharge status: Home / Self Care | Payer: PPO | Source: Ambulatory Visit | Attending: Cardiovascular Disease | Admitting: Cardiovascular Disease

## 2021-03-20 ENCOUNTER — Other Ambulatory Visit: Payer: Self-pay

## 2021-03-20 DIAGNOSIS — R0989 Other specified symptoms and signs involving the circulatory and respiratory systems: Secondary | ICD-10-CM | POA: Insufficient documentation

## 2021-03-21 ENCOUNTER — Other Ambulatory Visit (HOSPITAL_COMMUNITY): Payer: Self-pay

## 2021-03-21 MED FILL — Amlodipine Besylate Tab 10 MG (Base Equivalent): ORAL | 30 days supply | Qty: 30 | Fill #0 | Status: AC

## 2021-03-22 ENCOUNTER — Encounter: Payer: Self-pay | Admitting: Pulmonary Disease

## 2021-03-22 ENCOUNTER — Ambulatory Visit: Payer: PPO | Admitting: Pulmonary Disease

## 2021-03-22 ENCOUNTER — Other Ambulatory Visit: Payer: Self-pay

## 2021-03-22 VITALS — BP 138/66 | HR 65 | Temp 97.7°F | Ht 68.75 in | Wt 145.6 lb

## 2021-03-22 DIAGNOSIS — R062 Wheezing: Secondary | ICD-10-CM

## 2021-03-22 DIAGNOSIS — F17210 Nicotine dependence, cigarettes, uncomplicated: Secondary | ICD-10-CM | POA: Diagnosis not present

## 2021-03-22 NOTE — Patient Instructions (Signed)
I have reviewed your imaging.  You have mild emphysema and probably mild COPD Continue to use the albuterol as needed  We will schedule PFTs at the next available for baseline assessment Follow-up in 6 months.

## 2021-03-22 NOTE — Progress Notes (Signed)
Ashley Robertson    [id number]    [birth year]  Primary Care Physician:Ashley Robertson, Ashley Mola, MD  Referring Physician: Aretta Robertson, [place],  [place] [address]  Chief complaint: Consult for dyspnea, wheezing  HPI: Active smoker with history of hypertension, hyperlipidemia, emphysema Referred for evaluation of mild wheezing heard at primary care office  Symptoms are intermittent wheezing.  Occasional cough with white mucus.  No symptoms of dyspnea or exercise limitation.  She has an albuterol inhaler which she uses once a month.  No history of exacerbations or hospitalizations for respiratory issues  Pets: Dog Occupation: Retired Haematologist at [employer]: No mold, hot tub, Jacuzzi.  No feather pillows or comforter Smoking history: 41-pack-year smoker.  Continues to smoke 1 pack/day Travel history: No significant travel history Relevant family history: No family history of lung disease  Outpatient Encounter Medications as of 03/22/2021  Medication Sig  . albuterol (PROVENTIL HFA;VENTOLIN HFA) 108 (90 BASE) MCG/ACT inhaler Inhale 2 puffs into the lungs every 4 (four) hours as needed for wheezing or shortness of breath.  Marland Kitchen amLODipine (NORVASC) 10 MG tablet TAKE 1 TABLET BY MOUTH AT BEDTIME  . aspirin 81 MG tablet Take 81 mg by mouth daily.  . chlorthalidone (HYGROTON) 25 MG tablet TAKE 1 TABLET BY MOUTH DAILY.  Marland Kitchen Cholecalciferol (VITAMIN D) 50 MCG (2000 UT) CAPS Take by mouth.  Marland Kitchen FLUoxetine (PROZAC) 20 MG capsule Take 20 mg by mouth daily.  Marland Kitchen OVER THE COUNTER MEDICATION Serrapeptase 120,000 units dailyReported on 12/27/2015  . rosuvastatin (CRESTOR) 5 MG tablet TAKE 1 TABLET BY MOUTH ONCE DAILY  . Specialty Vitamins Products (MAGNESIUM, AMINO ACID CHELATE,) 133 MG tablet Take 1 tablet by mouth 2 (two) times daily. PURE magnesium supplement - 120 mg of mag per capsule  . [DISCONTINUED] rosuvastatin (CRESTOR) 5 MG tablet TAKE 1 TABLET BY MOUTH  ONCE DAILY  . [DISCONTINUED] albuterol (VENTOLIN HFA) 108 (90 Base) MCG/ACT inhaler INHALE 1-2 PUFFS BY MOUTH EVERY 6 HOURS AS NEEDED  . [DISCONTINUED] amoxicillin-clavulanate (AUGMENTIN) 875-125 MG tablet TAKE 1 TABLET BY MOUTH TWO TIMES DAILY UNTIL GONE  . [DISCONTINUED] doxycycline (VIBRAMYCIN) 100 MG capsule TAKE 1 CAPSULE BY MOUTH 2 TIMES DAILY  . [DISCONTINUED] FLUoxetine (PROZAC) 20 MG capsule TAKE 1 CAPSULE BY MOUTH ONCE DAILY  . [DISCONTINUED] FLUoxetine (PROZAC) 20 MG capsule TAKE 1 CAPSULE BY MOUTH ONCE A DAY  . [DISCONTINUED] hydrochlorothiazide (HYDRODIURIL) 25 MG tablet Take 1 tablet (25 mg total) by mouth daily.  . [DISCONTINUED] lisinopril (ZESTRIL) 5 MG tablet Take 1 tablet (5 mg total) by mouth daily.  . [DISCONTINUED] rosuvastatin (CRESTOR) 5 MG tablet Take 5 mg by mouth daily. Take 1 tablet every day.  . [DISCONTINUED] rosuvastatin (CRESTOR) 5 MG tablet TAKE 1 TABLET BY MOUTH ONCE DAILY   No facility-administered encounter medications on file as of 03/22/2021.    Allergies as of 03/22/2021 - Review Complete 03/22/2021  Allergen Reaction Noted  . Lisinopril Cough 11/07/2020  . Lipitor [atorvastatin] Other (See Comments) 03/19/2020    Past Medical History:  Diagnosis Date  . Anxiety and depression   . Aortic atherosclerosis ([place])    Noted on chest CT  . Carotid artery plaque, bilateral 10/2015   Mild to moderate plaque L>R without significant stenosis  . Coronary artery calcification seen on CAT scan    Noted on chest CT  . Current every day smoker   . Emphysema lung ([place])  Noted on chest CT  . Hyperlipidemia   . Prediabetes     Past Surgical History:  Procedure Laterality Date  . [place]  . RHINOPLASTY  1981  . TONSILLECTOMY  1978    Family History  Problem Relation Age of Onset  . Transient ischemic attack Mother 26  . Parkinson's disease Father   . Hyperlipidemia Father   . Hypertension Father   . Cancer Father        Left  shoulder  . Healthy Sister   . Healthy Brother   . Healthy Brother   . Healthy Brother   . Hypertension Sister     Social History   Socioeconomic History  . Marital status: Legally Separated    Spouse name: Not on file  . Number of children: 2  . Years of education: Not on file  . Highest education level: Bachelor's degree (e.g., BA, AB, BS)  Occupational History  . 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 Smoking status: Current Every Day Smoker    Packs/day: 1.00    Years: 40.00    Pack years: 40.00    Types: Cigarettes  . Smokeless tobacco: Never Used  Substance and Sexual Activity  . Alcohol use: Yes    Alcohol/week: 2.0 standard drinks    Types: 2 Glasses of wine per week  . Drug use: No  . Sexual activity: Not Currently  Other Topics Concern  . 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  She used to work at [employer].   She is legally separated.  Has 2 daughters.   Smokes roughly 1 pack a day and has been doing so since age 64.   Drinks 4 to 6 cups of coffee a day.      Currently trying Intermittent Fasting diet for weight loss.  Does not routinely exercise.   Social Determinants of Health   Financial Resource Strain: Not on file  Food Insecurity: Not on file  Transportation Needs: Not on file  Physical Activity: Not on file  Stress: Not on file  Social Connections: Not on file  Intimate Partner Violence: Not on file    Review of systems: Review of Systems  Constitutional: Negative for fever and chills.  HENT: Negative.   Eyes: Negative for blurred vision.  Respiratory: as per HPI  Cardiovascular: Negative for chest pain and palpitations.  Gastrointestinal: Negative for vomiting, diarrhea, blood per rectum. Genitourinary: Negative for dysuria, urgency, frequency and hematuria.  Musculoskeletal:  Negative for myalgias, back pain and joint pain.  Skin: Negative for itching and rash.  Neurological: Negative for dizziness, tremors, focal weakness, seizures and loss of consciousness.  Endo/Heme/Allergies: Negative for environmental allergies.  Psychiatric/Behavioral: Negative for depression, suicidal ideas and hallucinations.  All other systems reviewed and are negative.  Physical Exam: Blood pressure 138/66, pulse 65, temperature 97.7 F (36.5 C), temperature source Temporal, height 5' 8.75" (1.746 m), weight 145 lb 9.6 oz (66 kg), SpO2 100 %. Gen:      No acute distress HEENT:  EOMI, sclera anicteric Neck:     No masses; no thyromegaly Lungs:    Clear to auscultation bilaterally; normal respiratory effort CV:         Regular rate and rhythm; no murmurs Abd:      + bowel  sounds; soft, non-tender; no palpable masses, no distension Ext:    No edema; adequate peripheral perfusion Skin:      Warm and dry; no rash Neuro: alert and oriented x 3 Psych: normal mood and affect  Data Reviewed: Imaging: CT chest 03/09/2020- mild emphysema, 5 proximal 6 mm lung nodule, coronary atherosclerosis.  I have reviewed the images personally.  PFTs:  Labs:  Assessment:  Mild emphysema No wheezing on examination today.  Her respiratory status appears well compensated with no baseline dyspnea Continue albuterol as needed.  She does not need controller medications at present  Schedule PFTs for baseline assessment  Lung nodule Continue annual low-dose screening CT  Active smoker Discussed smoking cessation but she is not ready to quit.  Reevaluate at return visit Time spent counseling-5 minutes  Plan/Recommendations: PFTs Smoking cessation  Marshell Garfinkel MD [facility] 03/22/2021, 9:24 AM  CC: Rankins, Bill Salinas, MD

## 2021-04-07 ENCOUNTER — Other Ambulatory Visit (HOSPITAL_COMMUNITY)
Admission: RE | Admit: 2021-04-07 | Discharge: 2021-04-07 | Disposition: A | Discharge status: Home / Self Care | Payer: PPO | Source: Ambulatory Visit | Attending: Pulmonary Disease | Admitting: Pulmonary Disease

## 2021-04-07 DIAGNOSIS — Z20822 Contact with and (suspected) exposure to covid-19: Secondary | ICD-10-CM | POA: Diagnosis not present

## 2021-04-07 DIAGNOSIS — Z01812 Encounter for preprocedural laboratory examination: Secondary | ICD-10-CM | POA: Diagnosis not present

## 2021-04-07 DIAGNOSIS — E871 Hypo-osmolality and hyponatremia: Secondary | ICD-10-CM | POA: Diagnosis not present

## 2021-04-07 LAB — SARS CORONAVIRUS 2 (TAT 6-24 HRS): SARS Coronavirus 2: NEGATIVE

## 2021-04-10 ENCOUNTER — Other Ambulatory Visit: Payer: Self-pay

## 2021-04-10 ENCOUNTER — Ambulatory Visit (INDEPENDENT_AMBULATORY_CARE_PROVIDER_SITE_OTHER): Payer: PPO | Admitting: Pulmonary Disease

## 2021-04-10 ENCOUNTER — Ambulatory Visit (INDEPENDENT_AMBULATORY_CARE_PROVIDER_SITE_OTHER)
Admission: RE | Admit: 2021-04-10 | Discharge: 2021-04-10 | Disposition: A | Discharge status: Home / Self Care | Payer: PPO | Source: Ambulatory Visit | Attending: Acute Care | Admitting: Acute Care

## 2021-04-10 ENCOUNTER — Other Ambulatory Visit (HOSPITAL_COMMUNITY): Payer: Self-pay

## 2021-04-10 DIAGNOSIS — F17210 Nicotine dependence, cigarettes, uncomplicated: Secondary | ICD-10-CM | POA: Diagnosis not present

## 2021-04-10 DIAGNOSIS — Z87891 Personal history of nicotine dependence: Secondary | ICD-10-CM

## 2021-04-10 DIAGNOSIS — R062 Wheezing: Secondary | ICD-10-CM | POA: Diagnosis not present

## 2021-04-10 LAB — PULMONARY FUNCTION TEST
DL/VA % pred: 75 %
DL/VA: 3.04 ml/min/mmHg/L
DLCO cor % pred: 79 %
DLCO cor: 18.57 ml/min/mmHg
DLCO unc % pred: 79 %
DLCO unc: 18.57 ml/min/mmHg
FEF 25-75 Post: 1.6 L/sec
FEF 25-75 Pre: 0.95 L/sec
FEF2575-%Change-Post: 67 %
FEF2575-%Pred-Post: 65 %
FEF2575-%Pred-Pre: 39 %
FEV1-%Change-Post: 21 %
FEV1-%Pred-Post: 76 %
FEV1-%Pred-Pre: 62 %
FEV1-Post: 2.22 L
FEV1-Pre: 1.82 L
FEV1FVC-%Change-Post: 10 %
FEV1FVC-%Pred-Pre: 73 %
FEV6-%Change-Post: 12 %
FEV6-%Pred-Post: 96 %
FEV6-%Pred-Pre: 85 %
FEV6-Post: 3.52 L
FEV6-Pre: 3.14 L
FEV6FVC-%Change-Post: 1 %
FEV6FVC-%Pred-Post: 103 %
FEV6FVC-%Pred-Pre: 102 %
FVC-%Change-Post: 10 %
FVC-%Pred-Post: 93 %
FVC-%Pred-Pre: 85 %
FVC-Post: 3.56 L
FVC-Pre: 3.23 L
Post FEV1/FVC ratio: 63 %
Post FEV6/FVC ratio: 100 %
Pre FEV1/FVC ratio: 57 %
Pre FEV6/FVC Ratio: 98 %
RV % pred: 177 %
RV: 4.15 L
TLC % pred: 124 %
TLC: 7.23 L

## 2021-04-10 MED FILL — Chlorthalidone Tab 25 MG: ORAL | 30 days supply | Qty: 30 | Fill #1 | Status: AC

## 2021-04-10 NOTE — Progress Notes (Signed)
PFT done today. 

## 2021-04-17 ENCOUNTER — Other Ambulatory Visit: Payer: Self-pay

## 2021-04-17 ENCOUNTER — Encounter: Payer: Self-pay | Admitting: Cardiology

## 2021-04-17 ENCOUNTER — Ambulatory Visit: Payer: PPO | Admitting: Cardiology

## 2021-04-17 VITALS — BP 140/78 | HR 67 | Ht 68.75 in | Wt 145.0 lb

## 2021-04-17 DIAGNOSIS — R7303 Prediabetes: Secondary | ICD-10-CM

## 2021-04-17 DIAGNOSIS — I10 Essential (primary) hypertension: Secondary | ICD-10-CM | POA: Diagnosis not present

## 2021-04-17 DIAGNOSIS — R0989 Other specified symptoms and signs involving the circulatory and respiratory systems: Secondary | ICD-10-CM

## 2021-04-17 DIAGNOSIS — F17200 Nicotine dependence, unspecified, uncomplicated: Secondary | ICD-10-CM

## 2021-04-17 DIAGNOSIS — R931 Abnormal findings on diagnostic imaging of heart and coronary circulation: Secondary | ICD-10-CM

## 2021-04-17 NOTE — Progress Notes (Signed)
Primary Care Provider: Aretta Nip, MD Cardiologist: Glenetta Hew, MD Electrophysiologist: None  Clinic Note: Chief Complaint  Patient presents with  . Follow-up  . Coronary Artery Disease    Coronary calcium score of 79.  Likely nonobstructive.  Asymptomatic.  Marland Kitchen Hypertension    Has been following up with [facility] in the last year.    ===================================  ASSESSMENT/PLAN   Problem List Items Addressed This Visit    Bilateral carotid bruits    Carotid Dopplers did not suggest any significant obstructive disease. Continue CV risk factor modification.      Agatston CAC score, <100 - Primary (Chronic)    Two-vessel CAD noted on coronary calcium score, not likely obstructive based on lack of symptoms. Monitor for signs symptoms of angina.  For now no ischemic evaluation warranted.  Target LDL should be less than 100 if not less than 70.->  Not quite at goal. Borderline blood pressures on amlodipine and chlorthalidone.  Low threshold to add nonselective beta agonist.  Has allergy to ACE inhibitors, therefore would avoid ARB.      Essential hypertension (Chronic)    [name] is followed up on a couple occasions with [facility].  Borderline blood pressure today on chlorthalidone and amlodipine at current doses.  She has had some hyponatremia issues, therefore would not titrate chlorthalidone further. With allergy to ACE inhibitor, probably avoiding ARB.  Next valid choice would be a beta-blocker such as carvedilol or Bystolic.    Monitor for now with blood pressure log and reassess in roughly 2 months via virtual visit..      Current every day smoker (Chronic)    She still smoking, does not seem to be interested in quitting.  We talked briefly about it but was not interested in counseling.      Prediabetes (Chronic)    A1c still below threshold at 5.8.  Continue to monitor.  Hopefully if she is able  to lose some more weight, this will go down. Thankfully, she has lost weight and therefore is no longer in the "metabolic syndrome category ".         ===================================  HPI:    Ashley Robertson is a 65 y.o. female former long-term smoker with a PMH notable for Prediabetes, Hyperlipidemia, and Hypertension along with Coronary Calcification Seen on CT Scan who presents today for annual follow-up.  Ashley Robertson was last seen by me on Apr 26, 2020 for evaluation with CT scan showing LAD and LCx coronary atherosclerosis as well as aortic atherosclerosis. -> she had relatively low Risk Stratification with Coronary Calcium Score-79, LAD and LCx calcification noted.. -> She did note some exertional dyspnea which is stable related to deconditioning and prior smoking.  No chest pain or pressure.  She has done very well with weight loss, had plateaued.  Recent Hospitalizations: None  Reviewed  CV studies:    The following studies were reviewed today: (if available, images/films reviewed: From Epic Chart or Care Everywhere) . Carotid Dopplers 03/22/2019: Bilateral CCA flow velocities consistent with 1-39% stenosis.  Nonhemodynamically significant plaque <50% noted in bilateral ICA.  Bilateral vertebral arteries demonstrate antegrade flow.  Disturbed flow in right subclavian artery with normal flow in left pulm artery. ->  Stable   Interval History:   Ashley Robertson returns here today for annual follow-up doing fairly well.  She really has no major cardiac symptoms.  She is hoping to be more active than she is,  but is limited somewhat by knee pain.  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  She does not do routine exercise because of knee pain limits her activity.  She is not able to walk long distances (more than a block) without having significant knee pain.  She is trying to work on weight  loss, and is trying to adopt a new diet.  Unfortunately, her knee pain does limit her exercise.  CV Review of Symptoms (Summary): positive for - Some mild exertional dyspnea because of deconditioning, and also in her knees give out.  Mild end of day swelling. negative for - chest pain, irregular heartbeat, orthopnea, palpitations, paroxysmal nocturnal dyspnea, rapid heart rate, shortness of breath or Syncope/near syncope or TIA/amaurosis fugax, claudication   REVIEWED OF SYSTEMS   Review of Systems  Constitutional: Negative for malaise/fatigue and weight loss (Plateaued weight loss).  HENT: Negative for congestion and nosebleeds.   Respiratory: Negative for cough and shortness of breath (Only associated with significant exertion.).   Cardiovascular: Negative for leg swelling.  Gastrointestinal: Negative for abdominal pain, blood in stool, constipation and melena.  Genitourinary: Negative for frequency and hematuria.  Musculoskeletal: Positive for joint pain (Bilateral knee pain limits activity).  Neurological: Positive for dizziness (Minimal disease sometimes when she stands up too quickly). Negative for focal weakness and weakness.  Psychiatric/Behavioral: Negative for depression and memory loss. The patient is not nervous/anxious and does not have insomnia.    I have reviewed and (if needed) personally updated the patient's problem list, medications, allergies, past medical and surgical history, social and family history.   PAST MEDICAL HISTORY   Past Medical History:  Diagnosis Date  . Anxiety and depression   . Aortic atherosclerosis ([place])    Noted on chest CT  . Carotid artery plaque, bilateral 10/2015   Mild to moderate plaque L>R without significant stenosis  . Coronary artery calcification seen on CAT scan    Noted on chest CT  . Current every day smoker   . Emphysema lung ([place])    Noted on chest CT  . Hyperlipidemia   . Prediabetes     PAST SURGICAL HISTORY   Past  Surgical History:  Procedure Laterality Date  . [place]  . RHINOPLASTY  1981  . TONSILLECTOMY  1978    (March 09, 2020) CT Chest: 2 V (LAD & LCx) Coronary Atherosclerosis, Aortic Atherosclerosis (no aneurysm).  Mild centrilobular emphysema with mild diffuse bronchial thickening; several scattered small solitary pulmonary nodules (largest 5.6 mm in anterior left upper lobe)  (10/29/2016) TTE: EF 55 to 60%.  Normal systolic and diastolic function.  No ASD/PFO   Coronary Calcium Score 79.  LAD and circumflex calcification noted.  Normal ascending aorta with mild calcification.   Immunization History  Administered Date(s) Administered  . Hepatitis B, adult 12/03/2016  . Influenza Split 09/13/2017, 09/02/2018  . PFIZER(Purple Top)SARS-COV-2 Vaccination 02/26/2020, 03/18/2020, 10/31/2020  . Pneumococcal Polysaccharide-23 02/21/2021  . Tdap 02/11/2020    MEDICATIONS/ALLERGIES   Current Meds  Medication Sig  . albuterol (PROVENTIL HFA;VENTOLIN HFA) 108 (90 BASE) MCG/ACT inhaler Inhale 2 puffs into the lungs every 4 (four) hours as needed for wheezing or shortness of breath.  Marland Kitchen aspirin 81 MG tablet Take 81 mg by mouth daily.  . chlorthalidone (HYGROTON) 25 MG tablet TAKE 1 TABLET BY MOUTH DAILY.  Marland Kitchen Cholecalciferol (VITAMIN D) 50 MCG (2000 UT) CAPS  Take by mouth.  Marland Kitchen FLUoxetine (PROZAC) 20 MG capsule Take 20 mg by mouth daily.  Marland Kitchen OVER THE COUNTER MEDICATION Serrapeptase 120,000 units dailyReported on 12/27/2015  . rosuvastatin (CRESTOR) 5 MG tablet TAKE 1 TABLET BY MOUTH ONCE DAILY  . Specialty Vitamins Products (MAGNESIUM, AMINO ACID CHELATE,) 133 MG tablet Take 1 tablet by mouth 2 (two) times daily. PURE magnesium supplement - 120 mg of mag per capsule  . [DISCONTINUED] amLODipine (NORVASC) 10 MG tablet TAKE 1 TABLET BY MOUTH AT BEDTIME    Allergies  Allergen Reactions  . Lisinopril Cough  . Lipitor [Atorvastatin] Other (See Comments)    Myalgias    SOCIAL  HISTORY/FAMILY HISTORY   Reviewed in Epic:  Pertinent findings:  Social History   Tobacco Use  . Smoking status: Current Every Day Smoker    Packs/day: 1.00    Years: 40.00    Pack years: 40.00    Types: Cigarettes  . Smokeless tobacco: Never Used  Substance Use Topics  . Alcohol use: Yes    Alcohol/week: 2.0 standard drinks    Types: 2 Glasses of wine per week  . 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  She used to work at [employer].   She is legally separated.  Has 2 daughters.   Smokes roughly 1 pack a day and has been doing so since age 3.   Drinks 4 to 6 cups of coffee a day.      Currently trying Intermittent Fasting diet for weight loss.  Does not routinely exercise.    OBJCTIVE -PE, EKG, labs   Wt Readings from Last 3 Encounters:  04/17/21 145 lb (65.8 kg)  03/22/21 145 lb 9.6 oz (66 kg)  11/07/20 147 lb 9.6 oz (67 kg)    Physical Exam: BP 140/78   Pulse 67   Ht 5' 8.75" (1.746 m)   Wt 145 lb (65.8 kg)   SpO2 95%   BMI 21.57 kg/m  Physical Exam Vitals reviewed.  Constitutional:      General: She is not in acute distress.    Appearance: Normal appearance. She is obese. She is not ill-appearing or toxic-appearing.     Comments: Mild tobacco odor.  Well-groomed.  Well-nourished.  HENT:     Head: Normocephalic and atraumatic.  Neck:     Vascular: Carotid bruit (Soft bilateral) present. No hepatojugular reflux or JVD.  Cardiovascular:     Rate and Rhythm: Normal rate and regular rhythm.  No extrasystoles are present.    Chest Wall: PMI is not displaced.     Pulses: Normal pulses.     Heart sounds: Normal heart sounds. No murmur heard. No friction rub. No gallop.   Pulmonary:     Effort: Pulmonary effort is normal. No respiratory distress.     Breath sounds: Normal breath sounds. No rhonchi or rales.  Chest:     Chest  wall: No tenderness.  Musculoskeletal:        General: No swelling. Normal range of motion.     Cervical back: Normal range of motion and neck supple.     Comments: Finger clubbing but no cyanosis  Skin:    General: Skin is warm and dry.  Neurological:     General: No focal deficit present.     Mental Status: She is alert and oriented to person,  place, and time.     Gait: Gait normal.  Psychiatric:        Mood and Affect: Mood normal.        Behavior: Behavior normal.        Thought Content: Thought content normal.        Judgment: Judgment normal.     Adult ECG Report Not checked  Recent Labs:   02/21/2021: TC 197, TG 71, HDL 37, LDL 107.  A1c 5.8.  04/07/2021: Cr 0.69, K+ 3.3. Lab Results  Component Value Date   CHOL 169 08/03/2020   HDL 68 08/03/2020   LDLCALC 90 08/03/2020   TRIG 56 08/03/2020   CHOLHDL 2.5 08/03/2020   Lab Results  Component Value Date   CREATININE 0.66 11/03/2020   BUN 10 11/03/2020   NA 137 11/03/2020   K 4.4 11/03/2020   CL 91 (L) 11/03/2020   CO2 28 11/03/2020   No flowsheet data found.  No results found for: TSH  ==================================================  COVID-19 Education: The signs and symptoms of COVID-19 were discussed with the patient and how to seek care for testing (follow up with PCP or arrange E-visit).    I spent a total of 60minutes with the patient spent in direct patient consultation.  Additional time spent with chart review  / charting (studies, outside notes, etc): 14 min Total Time: 32 min   Current medicines are reviewed at length with the patient today.  (+/- concerns) n/a  This visit occurred during the SARS-CoV-2 public health emergency.  Safety protocols were in place, including screening questions prior to the visit, additional usage of staff PPE, and extensive cleaning of exam room while observing appropriate contact time as indicated for disinfecting solutions.  Notice: This dictation was prepared  with Dragon dictation along with smaller phrase technology. Any transcriptional errors that result from this process are unintentional and may not be corrected upon review.  Patient Instructions / Medication Changes & Studies & Tests Ordered   Patient Instructions  Medication Instructions:   NO CHANGES  *If you need a refill on your cardiac medications before your next appointment, please call your pharmacy*   Lab Work: NOT NEEDED   Testing/Procedures:  NOT NEEDED  Follow-Up: At [facility], you and your health needs are our priority.  As part of our continuing mission to provide you with exceptional heart care, we have created designated Provider Care Teams.  These Care Teams include your primary Cardiologist (physician) and Advanced Practice Providers (APPs -  Physician Assistants and Nurse Practitioners) who all work together to provide you with the care you need, when you need it.     Your next appointment:   2 TO 3 month(s)  The format for your next appointment:   Virtual Visit   Provider:   Glenetta Hew, MD   Other Instructions BLOOD PRESSURE  LOG FOR THE NEXT 2 MONTHS -  MAY SEND READING OR FAX TO OFFICE PRIOR TO VIRTUAL VISIT     Studies Ordered:   No orders of the defined types were placed in this encounter.    Glenetta Hew, M.D., M.S. Interventional Cardiologist   Pager # [phone] Phone # [phone] [address]. [place], [place] [address]   Thank you for choosing Heartcare at [facility]!!

## 2021-04-17 NOTE — Patient Instructions (Signed)
Medication Instructions:   NO CHANGES  *If you need a refill on your cardiac medications before your next appointment, please call your pharmacy*   Lab Work: NOT NEEDED   Testing/Procedures:  NOT NEEDED  Follow-Up: At [facility], you and your health needs are our priority.  As part of our continuing mission to provide you with exceptional heart care, we have created designated Provider Care Teams.  These Care Teams include your primary Cardiologist (physician) and Advanced Practice Providers (APPs -  Physician Assistants and Nurse Practitioners) who all work together to provide you with the care you need, when you need it.     Your next appointment:   2 TO 3 month(s)  The format for your next appointment:   Virtual Visit   Provider:   Glenetta Hew, MD   Other Instructions BLOOD PRESSURE  LOG FOR THE NEXT 2 MONTHS -  MAY SEND READING OR FAX TO OFFICE PRIOR TO VIRTUAL VISIT

## 2021-04-18 ENCOUNTER — Other Ambulatory Visit: Payer: Self-pay | Admitting: Family Medicine

## 2021-04-18 DIAGNOSIS — Z1231 Encounter for screening mammogram for malignant neoplasm of breast: Secondary | ICD-10-CM

## 2021-04-18 NOTE — Progress Notes (Signed)
Please call patient and let them  know their  low dose Ct was read as a Lung RADS 2: nodules that are benign in appearance and behavior with a very low likelihood of becoming a clinically active cancer due to size or lack of growth. Recommendation per radiology is for a repeat LDCT in 12 months. .Please let them  know we will order and schedule their  annual screening scan for 04/2022. Please let them  know there was notation of CAD on their  scan.  Please remind the patient  that this is a non-gated exam therefore degree or severity of disease  cannot be determined. Please have them  follow up with their PCP regarding potential risk factor modification, dietary therapy or pharmacologic therapy if clinically indicated. Pt.  is  currently on statin therapy. Please place order for annual  screening scan for  04/2022 and fax results to PCP. Thanks so much. 

## 2021-04-20 ENCOUNTER — Encounter: Payer: Self-pay | Admitting: *Deleted

## 2021-04-20 ENCOUNTER — Other Ambulatory Visit: Payer: Self-pay | Admitting: Cardiology

## 2021-04-20 ENCOUNTER — Other Ambulatory Visit (HOSPITAL_COMMUNITY): Payer: Self-pay

## 2021-04-20 DIAGNOSIS — Z87891 Personal history of nicotine dependence: Secondary | ICD-10-CM

## 2021-04-20 DIAGNOSIS — F17210 Nicotine dependence, cigarettes, uncomplicated: Secondary | ICD-10-CM

## 2021-04-20 MED ORDER — AMLODIPINE BESYLATE 10 MG PO TABS
ORAL_TABLET | Freq: Every day | ORAL | 1 refills | Status: DC
  Filled 2021-04-20: qty 30, 30d supply, fill #0
  Filled 2021-05-18: qty 30, 30d supply, fill #1

## 2021-05-10 ENCOUNTER — Other Ambulatory Visit (HOSPITAL_COMMUNITY): Payer: Self-pay

## 2021-05-10 ENCOUNTER — Encounter: Payer: Self-pay | Admitting: Cardiology

## 2021-05-10 MED FILL — Chlorthalidone Tab 25 MG: ORAL | 30 days supply | Qty: 30 | Fill #2 | Status: AC

## 2021-05-10 NOTE — Assessment & Plan Note (Signed)
A1c still below threshold at 5.8.  Continue to monitor.  Hopefully if she is able to lose some more weight, this will go down. Thankfully, she has lost weight and therefore is no longer in the "metabolic syndrome category ".

## 2021-05-10 NOTE — Assessment & Plan Note (Signed)
She still smoking, does not seem to be interested in quitting.  We talked briefly about it but was not interested in counseling.

## 2021-05-10 NOTE — Assessment & Plan Note (Addendum)
Two-vessel CAD noted on coronary calcium score, not likely obstructive based on lack of symptoms. Monitor for signs symptoms of angina.  For now no ischemic evaluation warranted.  Target LDL should be less than 100 if not less than 70.->  Not quite at goal. Borderline blood pressures on amlodipine and chlorthalidone.  Low threshold to add nonselective beta agonist.  Has allergy to ACE inhibitors, therefore would avoid ARB.

## 2021-05-10 NOTE — Assessment & Plan Note (Addendum)
[name] is followed up on a couple occasions with [facility].  Borderline blood pressure today on chlorthalidone and amlodipine at current doses.  She has had some hyponatremia issues, therefore would not titrate chlorthalidone further. With allergy to ACE inhibitor, probably avoiding ARB.  Next valid choice would be a beta-blocker such as carvedilol or Bystolic.    Monitor for now with blood pressure log and reassess in roughly 2 months via virtual visit.Ashley Robertson

## 2021-05-10 NOTE — Assessment & Plan Note (Signed)
Carotid Dopplers did not suggest any significant obstructive disease. Continue CV risk factor modification.

## 2021-05-18 ENCOUNTER — Other Ambulatory Visit (HOSPITAL_COMMUNITY): Payer: Self-pay

## 2021-05-23 ENCOUNTER — Other Ambulatory Visit (HOSPITAL_COMMUNITY): Payer: Self-pay

## 2021-05-23 MED FILL — Rosuvastatin Calcium Tab 5 MG: ORAL | 30 days supply | Qty: 30 | Fill #0 | Status: AC

## 2021-06-06 DIAGNOSIS — E871 Hypo-osmolality and hyponatremia: Secondary | ICD-10-CM | POA: Diagnosis not present

## 2021-06-06 DIAGNOSIS — R7303 Prediabetes: Secondary | ICD-10-CM | POA: Diagnosis not present

## 2021-06-07 ENCOUNTER — Other Ambulatory Visit (HOSPITAL_COMMUNITY): Payer: Self-pay

## 2021-06-07 ENCOUNTER — Other Ambulatory Visit: Payer: Self-pay | Admitting: Cardiology

## 2021-06-07 ENCOUNTER — Other Ambulatory Visit (HOSPITAL_BASED_OUTPATIENT_CLINIC_OR_DEPARTMENT_OTHER): Payer: Self-pay

## 2021-06-07 MED FILL — Rosuvastatin Calcium Tab 5 MG: ORAL | 30 days supply | Qty: 30 | Fill #1 | Status: CN

## 2021-06-08 ENCOUNTER — Other Ambulatory Visit (HOSPITAL_COMMUNITY): Payer: Self-pay

## 2021-06-08 MED ORDER — FLUOXETINE HCL 20 MG PO CAPS
ORAL_CAPSULE | ORAL | 1 refills | Status: DC
Start: 2021-06-08 — End: 2021-11-13
  Filled 2021-06-08: qty 90, 90d supply, fill #0
  Filled 2021-09-04: qty 90, 90d supply, fill #1

## 2021-06-08 MED ORDER — CHLORTHALIDONE 25 MG PO TABS
ORAL_TABLET | Freq: Every day | ORAL | 5 refills | Status: DC
  Filled 2021-06-08: qty 30, 30d supply, fill #0
  Filled 2021-07-08: qty 30, 30d supply, fill #1
  Filled 2021-08-11: qty 30, 30d supply, fill #2

## 2021-06-08 MED FILL — Rosuvastatin Calcium Tab 5 MG: ORAL | 30 days supply | Qty: 30 | Fill #1 | Status: CN

## 2021-06-09 ENCOUNTER — Telehealth: Payer: Self-pay | Admitting: *Deleted

## 2021-06-09 ENCOUNTER — Other Ambulatory Visit (HOSPITAL_COMMUNITY): Payer: Self-pay

## 2021-06-09 ENCOUNTER — Encounter: Payer: Self-pay | Admitting: Cardiology

## 2021-06-09 ENCOUNTER — Telehealth (INDEPENDENT_AMBULATORY_CARE_PROVIDER_SITE_OTHER): Payer: PPO | Admitting: Cardiology

## 2021-06-09 VITALS — BP 133/80 | HR 59 | Ht 69.0 in | Wt 145.0 lb

## 2021-06-09 DIAGNOSIS — E785 Hyperlipidemia, unspecified: Secondary | ICD-10-CM

## 2021-06-09 DIAGNOSIS — I10 Essential (primary) hypertension: Secondary | ICD-10-CM | POA: Diagnosis not present

## 2021-06-09 DIAGNOSIS — R931 Abnormal findings on diagnostic imaging of heart and coronary circulation: Secondary | ICD-10-CM | POA: Diagnosis not present

## 2021-06-09 DIAGNOSIS — R0989 Other specified symptoms and signs involving the circulatory and respiratory systems: Secondary | ICD-10-CM

## 2021-06-09 DIAGNOSIS — F17200 Nicotine dependence, unspecified, uncomplicated: Secondary | ICD-10-CM | POA: Diagnosis not present

## 2021-06-09 DIAGNOSIS — R7303 Prediabetes: Secondary | ICD-10-CM

## 2021-06-09 MED ORDER — ROSUVASTATIN CALCIUM 5 MG PO TABS
ORAL_TABLET | ORAL | 3 refills | Status: DC
  Filled 2021-06-09: qty 132, 90d supply, fill #0
  Filled 2021-09-04: qty 132, 90d supply, fill #1
  Filled 2021-11-21: qty 132, 90d supply, fill #2
  Filled 2022-02-19: qty 132, 90d supply, fill #3
  Filled 2022-05-22: qty 132, 90d supply, fill #4

## 2021-06-09 MED ORDER — FLUOXETINE HCL 20 MG PO CAPS
ORAL_CAPSULE | ORAL | 0 refills | Status: DC
  Filled 2021-12-08: qty 90, 90d supply, fill #0

## 2021-06-09 NOTE — Patient Instructions (Addendum)
Medication Instructions:   Lets try for a month to take your rosuvastatin 2 tablets daily 5 days a week (on weekdays) and 1 tablet daily on the weekend days.  Afebrile tolerated for a month, try to increase to 2 tablets daily every day.  Change rosuvastatin prescription to 2 tablets daily as directed): Dispense #180; 3 refills  *If you need a refill on your cardiac medications before your next appointment, please call your pharmacy*   Lab Work: Make sure you follow-up with your cholesterol levels and chemistry panels etc. with your primary care doctor in the next few months.     Testing/Procedures: N/a   Follow-Up: At [facility], you and your health needs are our priority.  As part of our continuing mission to provide you with exceptional heart care, we have created designated Provider Care Teams.  These Care Teams include your primary Cardiologist (physician) and Advanced Practice Providers (APPs -  Physician Assistants and Nurse Practitioners) who all work together to provide you with the care you need, when you need it.    Your next appointment:   1 year(s)  The format for your next appointment:   In Person  Provider:   You may see Glenetta Hew, MD or one of the following Advanced Practice Providers on your designated Care Team:   Rosaria Ferries, PA-C Caron Presume, PA-C Jory Sims, DNP, ANP   Other Instructions  Try to see if you can gradually work up to taking 2 tabs rosuvastatin (Crestor) every day if doing ok with Weekdays 2 tab & weekend days 1 tab for ~ 1 month.

## 2021-06-09 NOTE — Assessment & Plan Note (Signed)
She is due for lipid panel follow-up in September.  LDL was 90 last visit.  Since she does have a coronary calcium score of 79, would like to see her LDL probably closer to 70.  I have asked her to try to take rosuvastatin 10 mg 5 days a week on weekdays and then 1 5 mg tablet on weekends.  If she is able to tolerate this after a month, I want her to get up to 10 mg daily.  This should be time for this change to have some effect leading up to her upcoming labs in September.

## 2021-06-09 NOTE — Progress Notes (Signed)
Virtual Visit via Telephone Note   This visit type was conducted due to national recommendations for restrictions regarding the COVID-19 Pandemic (e.g. social distancing) in an effort to limit this patient's exposure and mitigate transmission in our community.  Due to her co-morbid illnesses, this patient is at least at moderate risk for complications without adequate follow up.  This format is felt to be most appropriate for this patient at this time.  The patient did not have access to video technology/had technical difficulties with video requiring transitioning to audio format only (telephone).  All issues noted in this document were discussed and addressed.  No physical exam could be performed with this format.  Please refer to the patient's chart for her  consent to telehealth for [facility].   Patient has given verbal permission to conduct this visit via virtual appointment and to bill insurance 06/09/2021 5:24 PM     Evaluation Performed:  Follow-up visit  Date:  06/09/2021   ID:  Ashley Robertson, DOB 04/26/1956, MRN [id number]  Patient Location: Home Provider Location: Home Office  PCP:  Rankins, Bill Salinas, MD  Cardiologist:  Glenetta Hew, MD  Electrophysiologist:  None   Chief Complaint:   Chief Complaint  Patient presents with   Hypertension    Follow-up blood pressure log     ====================================  ASSESSMENT & PLAN:    Problem List Items Addressed This Visit     Agatston CAC score, <100 (Chronic)    Two-vessel coronary calcification noted on CT.  Would like to try to get LDL less than 70 but will tolerate less than 100 she cannot tolerate further doses of statin.  Will gradually try to titrate up her statin to 10 mg daily. Smoking cessation counseling Blood pressure control-seems adequate.       Essential hypertension (Chronic)    Blood pressure looks pretty good now.  I was little bit concerned that it is getting elevated.  For the most  part she is running in the high 120s to low 130s.  For now we can continue current dose of amlodipine and chlorthalidone.  PCP is watching sodium and potassium levels if these do become an issue, then we may need to think of something else other than chlorthalidone which would probably be to add an ARB       Relevant Medications   rosuvastatin (CRESTOR) 5 MG tablet   Hyperlipidemia with target LDL less than 100 - Primary (Chronic)    She is due for lipid panel follow-up in September.  LDL was 90 last visit.  Since she does have a coronary calcium score of 79, would like to see her LDL probably closer to 70.  I have asked her to try to take rosuvastatin 10 mg 5 days a week on weekdays and then 1 5 mg tablet on weekends.  If she is able to tolerate this after a month, I want her to get up to 10 mg daily.  This should be time for this change to have some effect leading up to her upcoming labs in September.       Relevant Medications   rosuvastatin (CRESTOR) 5 MG tablet   Current every day smoker (Chronic)    Once we get her blood pressure stabilized and lipids stabilized, the next argument will be smoking cessation.  She was not interested in discussing today.       Bilateral carotid bruits   Prediabetes (Chronic)    Monitor closely        ====================================  History of Present Illness:    Ashley Robertson is a 65 y.o. female-long-term smoker-with PMH notable for Prediabetes (A1c 5.8), HLD and HTN with cOronary Calcification on CT (coronary calcium score 79) who presents via audio/video conferencing for a telehealth visit today as a 26-month BP follow-up.  Ashley Robertson was last seen on Apr 17, 2021 for annual follow-up.  At that visit, she was noted to have borderline elevated pressures.  Otherwise she was doing well with no major issues, simply limited by knee pain.  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  No routine exercise  due to knee pain-gets little bit short of breath with exertion more because of knee pain then true dyspnea..  Trying to work on weight loss with change in diet. -> I asked her to keep a BP log for [place] to review with this follow-up visit.  Hospitalizations:  None   Recent - Interim CV studies:   The following studies were reviewed today: None:  Inerval History   Ashley Robertson provided a BP log that we will try to scanned into the computer.  For the most part, her pressures have ranged mostly in the 120s to 130s over 70s-80s.  The highest pressure I saw was 1 reading of 148/79 and another 145/82.  Low blood pressure range was 101/65 and 102/68.  Heart rates have been anywhere from mid 50s to low 70s.  She still remains asymptomatic from a cardiac standpoint.  Cardiovascular ROS: no chest pain or dyspnea on exertion negative for - edema, irregular heartbeat, orthopnea, palpitations, paroxysmal nocturnal dyspnea, rapid heart rate, shortness of breath, or lightheadedness or dizziness or wooziness, syncope/near syncope or TIA/amaurosis fugax.   ROS:  Please see the history of present illness.      Past Medical History:  Diagnosis Date   Anxiety and depression    Aortic atherosclerosis ([place]) 03/2020   CT Chest: 2 V (LAD & LCx) Coronary Atherosclerosis, Aortic Atherosclerosis (no aneurysm).  Mild centrilobular emphysema with mild diffuse bronchial thickening; several scattered small solitary pulmonary nodules (largest 5.6 mm in anterior left upper lobe)   Carotid artery plaque, bilateral 10/2015   Mild to moderate plaque L>R without significant stenosis   Coronary Artery Calcification - Score 79    Coronary Calcium Score 79.  LAD and circumflex calcification noted.  Normal ascending aorta with mild calcification.   Current every day smoker    Emphysema lung ([place])    Noted on chest CT   Hyperlipidemia    Prediabetes    Past Surgical History:  Procedure Laterality Date   CESAREAN SECTION  1990,  [place] ECHOCARDIOGRAM  10/2016   EF 55 to 60%.  Normal systolic and diastolic function.  No ASD/PFO    \ Current Meds  Medication Sig   albuterol (PROVENTIL HFA;VENTOLIN HFA) 108 (90 BASE) MCG/ACT inhaler Inhale 2 puffs into the lungs every 4 (four) hours as needed for wheezing or shortness of breath.   amLODipine (NORVASC) 10 MG tablet TAKE 1 TABLET BY MOUTH AT BEDTIME   aspirin 81 MG tablet Take 81 mg by mouth daily.   Calcium Carb-Cholecalciferol (CALCIUM 500 + D3 PO) Take 1 tablet by mouth daily.   chlorthalidone (HYGROTON) 25 MG tablet Take one tablet by mouth daily.   Cholecalciferol (VITAMIN D) 50 MCG (2000 UT) CAPS Take by mouth.   FLUoxetine (PROZAC) 20 MG capsule Take 1  capsule by mouth once a day   OVER THE COUNTER MEDICATION Serrapeptase 120,000 units dailyReported on 12/27/2015   Specialty Vitamins Products (MAGNESIUM, AMINO ACID CHELATE,) 133 MG tablet Take 1 tablet by mouth at bedtime. PURE magnesium supplement - 120 mg of mag per capsule   [DISCONTINUED] rosuvastatin (CRESTOR) 5 MG tablet Take 5 mg  tablet  by mouth daily at bedtime ,except on Monday, Wednesday and Fridays take 2 tablets at betime     Allergies:   Lisinopril and Lipitor [atorvastatin]   Social History   Tobacco Use   Smoking status: Every Day    Packs/day: 1.00    Years: 40.00    Pack years: 40.00    Types: Cigarettes   Smokeless tobacco: Never  Substance Use Topics   Alcohol use: Yes    Alcohol/week: 2.0 standard drinks    Types: 2 Glasses of wine per week   Drug use: No     Family Hx: The patient's family history includes Cancer in her father; Healthy in her brother, brother, brother, and sister; Hyperlipidemia in her father; Hypertension in her father and sister; Parkinson's disease in her father; Transient ischemic attack (age of onset: 47) in her mother.   Labs/Other Tests and Data Reviewed:    EKG:    Recent Labs: 08/03/2020:  ALT 10 11/03/2020: BUN 10; Creatinine, Ser 0.66; Potassium 4.4; Sodium 137   Recent Lipid Panel Lab Results  Component Value Date/Time   CHOL 169 08/03/2020 08:32 AM   TRIG 56 08/03/2020 08:32 AM   HDL 68 08/03/2020 08:32 AM   CHOLHDL 2.5 08/03/2020 08:32 AM   LDLCALC 90 08/03/2020 08:32 AM    Wt Readings from Last 3 Encounters:  06/09/21 145 lb (65.8 kg)  04/17/21 145 lb (65.8 kg)  03/22/21 145 lb 9.6 oz (66 kg)     Objective:    Vital Signs:  BP 133/80   Pulse (!) 59   Ht 5\' 9"  (1.753 m)   Wt 145 lb (65.8 kg)   BMI 21.41 kg/m   VITAL SIGNS:  reviewed GEN:  no acute distress A&O x 3.  Normal Mood & Affect Non-labored respirations 60 to 60  ==========================================  COVID-19 Education: The signs and symptoms of COVID-19 were discussed with the patient and how to seek care for testing (follow up with PCP or arrange E-visit).   The importance of social distancing was discussed today.  Time:   Today, I have spent 8 minutes with the patient with telehealth technology discussing the above problems.   An additional 8 minutes spent charting (reviewing prior notes, hospital records, studies, labs etc.) Total 16 minutes   Medication Adjustments/Labs and Tests Ordered: Current medicines are reviewed at length with the patient today.  Concerns regarding medicines are outlined above.   Patient Instructions  Medication Instructions:   Lets try for a month to take your rosuvastatin 2 tablets daily 5 days a week (on weekdays) and 1 tablet daily on the weekend days.  Afebrile tolerated for a month, try to increase to 2 tablets daily every day.  Change rosuvastatin prescription to 2 tablets daily as directed): Dispense #180; 3 refills  *If you need a refill on your cardiac medications before your next appointment, please call your pharmacy*   Lab Work: Make sure you follow-up with your cholesterol levels and chemistry panels etc. with your primary care doctor  in the next few months.     Testing/Procedures: N/a   Follow-Up: At [facility], you and your  health needs are our priority.  As part of our continuing mission to provide you with exceptional heart care, we have created designated Provider Care Teams.  These Care Teams include your primary Cardiologist (physician) and Advanced Practice Providers (APPs -  Physician Assistants and Nurse Practitioners) who all work together to provide you with the care you need, when you need it.    Your next appointment:   1 year(s)  The format for your next appointment:   In Person  Provider:   You may see Glenetta Hew, MD or one of the following Advanced Practice Providers on your designated Care Team:   Rosaria Ferries, PA-C Caron Presume, PA-C Jory Sims, DNP, ANP   Other Instructions  Try to see if you can gradually work up to taking 2 tabs rosuvastatin (Crestor) every day if doing ok with Weekdays 2 tab & weekend days 1 tab for ~ 1 month.   Signed, Glenetta Hew, MD  06/09/2021 5:24 PM    [place]

## 2021-06-09 NOTE — Assessment & Plan Note (Signed)
Once we get her blood pressure stabilized and lipids stabilized, the next argument will be smoking cessation.  She was not interested in discussing today.

## 2021-06-09 NOTE — Telephone Encounter (Signed)
RN spoke to patient. Instruction were given  from today's virtual visit 06/09/21 .  AVS SUMMARY has been sent by mychart .   Patient verbalized understanding

## 2021-06-09 NOTE — Assessment & Plan Note (Signed)
Monitor closely

## 2021-06-09 NOTE — Telephone Encounter (Signed)
  Patient Consent for Virtual Visit        Ashley Robertson has provided verbal consent on 06/09/2021 for a virtual visit (video or telephone).   CONSENT FOR VIRTUAL VISIT FOR:  Ashley Robertson  By participating in this virtual visit I agree to the following:  I hereby voluntarily request, consent and authorize [facility] and its employed or contracted physicians, physician assistants, nurse practitioners or other licensed health care professionals (the Practitioner), to provide me with telemedicine health care services (the "Services") as deemed necessary by the treating Practitioner. I acknowledge and consent to receive the Services by the Practitioner via telemedicine. I understand that the telemedicine visit will involve communicating with the Practitioner through live audiovisual communication technology and the disclosure of certain medical information by electronic transmission. I acknowledge that I have been given the opportunity to request an in-person assessment or other available alternative prior to the telemedicine visit and am voluntarily participating in the telemedicine visit.  I understand that I have the right to withhold or withdraw my consent to the use of telemedicine in the course of my care at any time, without affecting my right to future care or treatment, and that the Practitioner or I may terminate the telemedicine visit at any time. I understand that I have the right to inspect all information obtained and/or recorded in the course of the telemedicine visit and may receive copies of available information for a reasonable fee.  I understand that some of the potential risks of receiving the Services via telemedicine include:  Delay or interruption in medical evaluation due to technological equipment failure or disruption; Information transmitted may not be sufficient (e.g. poor resolution of images) to allow for appropriate medical decision making by the Practitioner;  and/or  In rare instances, security protocols could fail, causing a breach of personal health information.  Furthermore, I acknowledge that it is my responsibility to provide information about my medical history, conditions and care that is complete and accurate to the best of my ability. I acknowledge that Practitioner's advice, recommendations, and/or decision may be based on factors not within their control, such as incomplete or inaccurate data provided by me or distortions of diagnostic images or specimens that may result from electronic transmissions. I understand that the practice of medicine is not an exact science and that Practitioner makes no warranties or guarantees regarding treatment outcomes. I acknowledge that a copy of this consent can be made available to me via my patient portal ([facility]), or I can request a printed copy by calling the office of [facility].    I understand that my insurance will be billed for this visit.   I have read or had this consent read to me. I understand the contents of this consent, which adequately explains the benefits and risks of the Services being provided via telemedicine.  I have been provided ample opportunity to ask questions regarding this consent and the Services and have had my questions answered to my satisfaction. I give my informed consent for the services to be provided through the use of telemedicine in my medical care

## 2021-06-09 NOTE — Assessment & Plan Note (Signed)
Two-vessel coronary calcification noted on CT.  Would like to try to get LDL less than 70 but will tolerate less than 100 she cannot tolerate further doses of statin.  Will gradually try to titrate up her statin to 10 mg daily. Smoking cessation counseling Blood pressure control-seems adequate.

## 2021-06-09 NOTE — Assessment & Plan Note (Signed)
Blood pressure looks pretty good now.  I was little bit concerned that it is getting elevated.  For the most part she is running in the high 120s to low 130s.  For now we can continue current dose of amlodipine and chlorthalidone.  PCP is watching sodium and potassium levels if these do become an issue, then we may need to think of something else other than chlorthalidone which would probably be to add an ARB

## 2021-06-12 ENCOUNTER — Other Ambulatory Visit (HOSPITAL_COMMUNITY): Payer: Self-pay

## 2021-06-13 ENCOUNTER — Other Ambulatory Visit (HOSPITAL_COMMUNITY): Payer: Self-pay

## 2021-06-13 DIAGNOSIS — H00021 Hordeolum internum right upper eyelid: Secondary | ICD-10-CM | POA: Diagnosis not present

## 2021-06-13 MED ORDER — MINOCYCLINE HCL 100 MG PO CAPS
ORAL_CAPSULE | ORAL | 0 refills | Status: DC
  Filled 2021-06-13: qty 20, 10d supply, fill #0

## 2021-06-14 ENCOUNTER — Other Ambulatory Visit (HOSPITAL_COMMUNITY): Payer: Self-pay

## 2021-06-15 ENCOUNTER — Other Ambulatory Visit (HOSPITAL_COMMUNITY): Payer: Self-pay

## 2021-06-15 ENCOUNTER — Other Ambulatory Visit: Payer: Self-pay

## 2021-06-15 ENCOUNTER — Ambulatory Visit
Admission: RE | Admit: 2021-06-15 | Discharge: 2021-06-15 | Disposition: A | Discharge status: Home / Self Care | Payer: PPO | Source: Ambulatory Visit | Attending: Family Medicine | Admitting: Family Medicine

## 2021-06-15 DIAGNOSIS — Z1231 Encounter for screening mammogram for malignant neoplasm of breast: Secondary | ICD-10-CM | POA: Diagnosis not present

## 2021-06-15 MED ORDER — CHLORTHALIDONE 15 MG PO TABS
ORAL_TABLET | ORAL | 0 refills | Status: DC
  Filled 2021-06-15: qty 30, 30d supply, fill #0

## 2021-06-21 ENCOUNTER — Other Ambulatory Visit: Payer: Self-pay | Admitting: Cardiology

## 2021-06-21 ENCOUNTER — Other Ambulatory Visit (HOSPITAL_COMMUNITY): Payer: Self-pay

## 2021-06-21 MED ORDER — AMLODIPINE BESYLATE 10 MG PO TABS
ORAL_TABLET | Freq: Every day | ORAL | 1 refills | Status: DC
Start: 2021-06-21 — End: 2021-08-18
  Filled 2021-06-21: qty 30, 30d supply, fill #0
  Filled 2021-07-20: qty 30, 30d supply, fill #1

## 2021-06-28 DIAGNOSIS — H00021 Hordeolum internum right upper eyelid: Secondary | ICD-10-CM | POA: Diagnosis not present

## 2021-06-29 DIAGNOSIS — D225 Melanocytic nevi of trunk: Secondary | ICD-10-CM | POA: Diagnosis not present

## 2021-06-29 DIAGNOSIS — L821 Other seborrheic keratosis: Secondary | ICD-10-CM | POA: Diagnosis not present

## 2021-06-29 DIAGNOSIS — L84 Corns and callosities: Secondary | ICD-10-CM | POA: Diagnosis not present

## 2021-06-29 DIAGNOSIS — L814 Other melanin hyperpigmentation: Secondary | ICD-10-CM | POA: Diagnosis not present

## 2021-06-29 DIAGNOSIS — L570 Actinic keratosis: Secondary | ICD-10-CM | POA: Diagnosis not present

## 2021-06-29 DIAGNOSIS — D485 Neoplasm of uncertain behavior of skin: Secondary | ICD-10-CM | POA: Diagnosis not present

## 2021-06-29 DIAGNOSIS — D1801 Hemangioma of skin and subcutaneous tissue: Secondary | ICD-10-CM | POA: Diagnosis not present

## 2021-06-29 DIAGNOSIS — C44729 Squamous cell carcinoma of skin of left lower limb, including hip: Secondary | ICD-10-CM | POA: Diagnosis not present

## 2021-07-06 DIAGNOSIS — J449 Chronic obstructive pulmonary disease, unspecified: Secondary | ICD-10-CM | POA: Diagnosis not present

## 2021-07-06 DIAGNOSIS — Z720 Tobacco use: Secondary | ICD-10-CM | POA: Diagnosis not present

## 2021-07-06 DIAGNOSIS — Z6820 Body mass index (BMI) 20.0-20.9, adult: Secondary | ICD-10-CM | POA: Diagnosis not present

## 2021-07-06 DIAGNOSIS — E876 Hypokalemia: Secondary | ICD-10-CM | POA: Diagnosis not present

## 2021-07-06 DIAGNOSIS — I10 Essential (primary) hypertension: Secondary | ICD-10-CM | POA: Diagnosis not present

## 2021-07-06 DIAGNOSIS — E871 Hypo-osmolality and hyponatremia: Secondary | ICD-10-CM | POA: Diagnosis not present

## 2021-07-08 ENCOUNTER — Other Ambulatory Visit (HOSPITAL_COMMUNITY): Payer: Self-pay

## 2021-07-08 MED ORDER — POTASSIUM CHLORIDE CRYS ER 20 MEQ PO TBCR
EXTENDED_RELEASE_TABLET | ORAL | 0 refills | Status: DC
  Filled 2021-07-08: qty 30, 15d supply, fill #0

## 2021-07-10 ENCOUNTER — Telehealth: Payer: Self-pay

## 2021-07-10 ENCOUNTER — Telehealth: Payer: Self-pay | Admitting: Cardiology

## 2021-07-10 DIAGNOSIS — I10 Essential (primary) hypertension: Secondary | ICD-10-CM

## 2021-07-10 NOTE — Telephone Encounter (Signed)
Spoke with patient.  Noted that lisinopril in the past has caused her to become hyperkalemic, and now, on chlorthalidone, she appears to be hypokalemic.  Endocrinology started her on potassium supplementation 20 mEq qd x 15 days.  Started this past Saturday.  Will have her repeat BMET later this week.  Also advised that she discontinue chlorthalidone for now.  She will monitor home BP readings as well.  Once potassium levels are stabilized, we can consider low dose valsartan hctz to help with BP management.  Hopefully the combination will prove to be potassium neutral for her.

## 2021-07-10 NOTE — Telephone Encounter (Signed)
   Pt c/o medication issue:  1. Name of Medication: chlorthalidone (HYGROTON) 25 MG tablet  2. How are you currently taking this medication (dosage and times per day)? Take one tablet by mouth daily.  3. Are you having a reaction (difficulty breathing--STAT)?  4. What is your medication issue? Pt said, she was told to call Dr. Ellyn Hack because the chlorthalidone is causing her potassium level dropped in a dangerous level. She wanted to know if Dr. Ellyn Hack will need to change it

## 2021-07-10 NOTE — Telephone Encounter (Signed)
Patient left message and needs to talk about her Chlorthalidone.  Please call to discuss.

## 2021-07-10 NOTE — Telephone Encounter (Signed)
Called patient, she states that her primary care doctor checked her blood work at a visit recently- they noticed her potassium level was dropping, so she sent her to another specialist Dr.Averneni- who rechecked blood work on 08/04 (last Thursday) she called on Saturday and told her that her potassium level was 2.9. she was putting her on Potassium tablet 20 meq daily for 15 days. She would send a message to [place] to advise of when he would like to recheck the labs and if he would make any adjustments to the Chlorthalidone medication as they thought this was the cause.  I advised I would route to MD to advise of medication changes, and if he would like to recheck blood work. Thanks!

## 2021-07-10 NOTE — Telephone Encounter (Signed)
Sounds reasonable.  Daizha Anand, MD  

## 2021-07-13 DIAGNOSIS — I10 Essential (primary) hypertension: Secondary | ICD-10-CM | POA: Diagnosis not present

## 2021-07-13 LAB — BASIC METABOLIC PANEL
BUN/Creatinine Ratio: 19 (ref 12–28)
BUN: 12 mg/dL (ref 8–27)
CO2: 26 mmol/L (ref 20–29)
Calcium: 9.5 mg/dL (ref 8.7–10.3)
Chloride: 93 mmol/L — ABNORMAL LOW (ref 96–106)
Creatinine, Ser: 0.64 mg/dL (ref 0.57–1.00)
Glucose: 107 mg/dL — ABNORMAL HIGH (ref 65–99)
Potassium: 5.2 mmol/L (ref 3.5–5.2)
Sodium: 134 mmol/L (ref 134–144)
eGFR: 98 mL/min/{1.73_m2} (ref >59)

## 2021-07-13 NOTE — Addendum Note (Signed)
Addended by: Rollen Sox on: 07/13/2021 08:45 AM   Modules accepted: Orders

## 2021-07-14 NOTE — Telephone Encounter (Signed)
Pt called regarding results of labs. I will route to kristin alvstad as she had previously been working w/pt

## 2021-07-17 NOTE — Addendum Note (Signed)
Addended by: Rockne Menghini on: 07/17/2021 04:09 PM   Modules accepted: Orders

## 2021-07-17 NOTE — Telephone Encounter (Signed)
Pt returned Ashley Robertson call so I will route to her

## 2021-07-17 NOTE — Telephone Encounter (Signed)
Reviewed labs with patient, as well as Dr. Allison Quarry note.  Patient stopped KCl several days ago and re-started chlorthalidone.  Will repeat labs in another 4-5 days.

## 2021-07-19 NOTE — Telephone Encounter (Signed)
See lab note and CVRR -pharamcist note from 07/10/21. Potassium address an medication changes

## 2021-07-20 ENCOUNTER — Other Ambulatory Visit (HOSPITAL_COMMUNITY): Payer: Self-pay

## 2021-07-20 MED ORDER — ALBUTEROL SULFATE HFA 108 (90 BASE) MCG/ACT IN AERS
INHALATION_SPRAY | RESPIRATORY_TRACT | 0 refills | Status: DC
  Filled 2021-07-20: qty 8.5, 25d supply, fill #0

## 2021-07-22 ENCOUNTER — Other Ambulatory Visit (HOSPITAL_COMMUNITY): Payer: Self-pay

## 2021-07-24 DIAGNOSIS — I10 Essential (primary) hypertension: Secondary | ICD-10-CM | POA: Diagnosis not present

## 2021-07-25 LAB — BASIC METABOLIC PANEL
BUN/Creatinine Ratio: 18 (ref 12–28)
BUN: 12 mg/dL (ref 8–27)
CO2: 28 mmol/L (ref 20–29)
Calcium: 10.1 mg/dL (ref 8.7–10.3)
Chloride: 87 mmol/L — ABNORMAL LOW (ref 96–106)
Creatinine, Ser: 0.68 mg/dL (ref 0.57–1.00)
Glucose: 99 mg/dL (ref 65–99)
Potassium: 3.7 mmol/L (ref 3.5–5.2)
Sodium: 135 mmol/L (ref 134–144)
eGFR: 97 mL/min/{1.73_m2} (ref >59)

## 2021-08-01 DIAGNOSIS — D0472 Carcinoma in situ of skin of left lower limb, including hip: Secondary | ICD-10-CM | POA: Diagnosis not present

## 2021-08-11 ENCOUNTER — Other Ambulatory Visit (HOSPITAL_COMMUNITY): Payer: Self-pay

## 2021-08-18 ENCOUNTER — Other Ambulatory Visit (HOSPITAL_COMMUNITY): Payer: Self-pay

## 2021-08-18 ENCOUNTER — Other Ambulatory Visit: Payer: Self-pay | Admitting: Cardiology

## 2021-08-18 MED ORDER — AMLODIPINE BESYLATE 10 MG PO TABS
ORAL_TABLET | Freq: Every day | ORAL | 6 refills | Status: DC
  Filled 2021-08-18: qty 30, 30d supply, fill #0
  Filled 2021-09-19: qty 30, 30d supply, fill #1
  Filled 2021-10-19: qty 30, 30d supply, fill #2
  Filled 2021-11-21: qty 30, 30d supply, fill #3
  Filled 2021-12-25: qty 30, 30d supply, fill #4
  Filled 2022-01-20: qty 30, 30d supply, fill #5
  Filled 2022-02-19: qty 30, 30d supply, fill #6

## 2021-08-22 DIAGNOSIS — E871 Hypo-osmolality and hyponatremia: Secondary | ICD-10-CM | POA: Diagnosis not present

## 2021-08-30 DIAGNOSIS — Z23 Encounter for immunization: Secondary | ICD-10-CM | POA: Diagnosis not present

## 2021-08-30 DIAGNOSIS — E871 Hypo-osmolality and hyponatremia: Secondary | ICD-10-CM | POA: Diagnosis not present

## 2021-08-30 DIAGNOSIS — R7303 Prediabetes: Secondary | ICD-10-CM | POA: Diagnosis not present

## 2021-08-30 DIAGNOSIS — E7800 Pure hypercholesterolemia, unspecified: Secondary | ICD-10-CM | POA: Diagnosis not present

## 2021-08-30 DIAGNOSIS — I10 Essential (primary) hypertension: Secondary | ICD-10-CM | POA: Diagnosis not present

## 2021-08-30 DIAGNOSIS — E876 Hypokalemia: Secondary | ICD-10-CM | POA: Diagnosis not present

## 2021-08-30 DIAGNOSIS — E878 Other disorders of electrolyte and fluid balance, not elsewhere classified: Secondary | ICD-10-CM | POA: Diagnosis not present

## 2021-08-30 DIAGNOSIS — F3341 Major depressive disorder, recurrent, in partial remission: Secondary | ICD-10-CM | POA: Diagnosis not present

## 2021-08-30 DIAGNOSIS — F17200 Nicotine dependence, unspecified, uncomplicated: Secondary | ICD-10-CM | POA: Diagnosis not present

## 2021-08-31 ENCOUNTER — Encounter: Payer: Self-pay | Admitting: Pulmonary Disease

## 2021-08-31 ENCOUNTER — Other Ambulatory Visit: Payer: Self-pay

## 2021-08-31 ENCOUNTER — Other Ambulatory Visit (HOSPITAL_COMMUNITY): Payer: Self-pay

## 2021-08-31 ENCOUNTER — Ambulatory Visit: Payer: PPO | Admitting: Pulmonary Disease

## 2021-08-31 VITALS — BP 116/70 | HR 60 | Temp 98.0°F | Ht 69.0 in | Wt 135.4 lb

## 2021-08-31 DIAGNOSIS — F17210 Nicotine dependence, cigarettes, uncomplicated: Secondary | ICD-10-CM | POA: Diagnosis not present

## 2021-08-31 DIAGNOSIS — J449 Chronic obstructive pulmonary disease, unspecified: Secondary | ICD-10-CM

## 2021-08-31 DIAGNOSIS — R062 Wheezing: Secondary | ICD-10-CM | POA: Diagnosis not present

## 2021-08-31 LAB — CBC WITH DIFFERENTIAL/PLATELET
Basophils Absolute: 0.1 10*3/uL (ref 0.0–0.1)
Basophils Relative: 1.3 % (ref 0.0–3.0)
Eosinophils Absolute: 0.1 10*3/uL (ref 0.0–0.7)
Eosinophils Relative: 1.5 % (ref 0.0–5.0)
HCT: 44.3 % (ref 36.0–46.0)
Hemoglobin: 15.1 g/dL — ABNORMAL HIGH (ref 12.0–15.0)
Lymphocytes Relative: 16.1 % (ref 12.0–46.0)
Lymphs Abs: 1.2 10*3/uL (ref 0.7–4.0)
MCHC: 34.1 g/dL (ref 30.0–36.0)
MCV: 97.3 fl (ref 78.0–100.0)
Monocytes Absolute: 0.7 10*3/uL (ref 0.1–1.0)
Monocytes Relative: 8.8 % (ref 3.0–12.0)
Neutro Abs: 5.4 10*3/uL (ref 1.4–7.7)
Neutrophils Relative %: 72.3 % (ref 43.0–77.0)
Platelets: 317 10*3/uL (ref 150.0–400.0)
RBC: 4.56 Mil/uL (ref 3.87–5.11)
RDW: 13.2 % (ref 11.5–15.5)
WBC: 7.5 10*3/uL (ref 4.0–10.5)

## 2021-08-31 MED ORDER — TRELEGY ELLIPTA 200-62.5-25 MCG/INH IN AEPB
1.0000 | INHALATION_SPRAY | Freq: Every day | RESPIRATORY_TRACT | 2 refills | Status: DC
  Filled 2021-08-31: qty 60, 30d supply, fill #0

## 2021-08-31 NOTE — Progress Notes (Signed)
Ashley Robertson    [id number]    1956/09/13  Primary Care Physician:Rankins, Bill Salinas, MD  Referring Physician: Aretta Nip, [place],  [place] [address]  Chief complaint: Follow up for COPD  HPI: Active smoker with history of hypertension, hyperlipidemia, emphysema Referred for evaluation of mild wheezing heard at primary care office  Symptoms are intermittent wheezing.  Occasional cough with white mucus.  No symptoms of dyspnea or exercise limitation.  She has an albuterol inhaler which she uses once a month.  No history of exacerbations or hospitalizations for respiratory issues  Pets: Dog Occupation: Retired Haematologist at [employer]: No mold, hot tub, Jacuzzi.  No feather pillows or comforter Smoking history: 41-pack-year smoker.  Continues to smoke 1 pack/day Travel history: No significant travel history Relevant family history: No family history of lung disease  Interim history: Here for review of pulmonary function tests Continues to smoke with chronic dyspnea on exertion  Outpatient Encounter Medications as of 08/31/2021  Medication Sig   albuterol (PROVENTIL HFA) 108 (90 Base) MCG/ACT inhaler Inhale 1-2 puffs by mouth as needed every 6 hours   albuterol (PROVENTIL HFA;VENTOLIN HFA) 108 (90 BASE) MCG/ACT inhaler Inhale 2 puffs into the lungs every 4 (four) hours as needed for wheezing or shortness of breath.   amLODipine (NORVASC) 10 MG tablet TAKE 1 TABLET BY MOUTH AT BEDTIME   aspirin 81 MG tablet Take 81 mg by mouth daily.   Calcium Carb-Cholecalciferol (CALCIUM 500 + D3 PO) Take 1 tablet by mouth daily.   chlorthalidone (HYGROTON) 25 MG tablet Take one tablet by mouth daily.   Cholecalciferol (VITAMIN D) 50 MCG (2000 UT) CAPS Take by mouth.   FLUoxetine (PROZAC) 20 MG capsule Take 1 capsule by mouth once a day   FLUoxetine (PROZAC) 20 MG capsule Take 1 capsule by mouth daily   minocycline (MINOCIN) 100 MG capsule  Take 1 capsule (100 mg) by mouth every 12 hours   OVER THE COUNTER MEDICATION Serrapeptase 120,000 units dailyReported on 12/27/2015   rosuvastatin (CRESTOR) 5 MG tablet Take 1 tablet by mouth daily at bedtime ,except on Monday, Wednesday and Fridays take 2 tablets at bedtime as directed   Specialty Vitamins Products (MAGNESIUM, AMINO ACID CHELATE,) 133 MG tablet Take 1 tablet by mouth at bedtime. PURE magnesium supplement - 120 mg of mag per capsule   [DISCONTINUED] hydrochlorothiazide (HYDRODIURIL) 25 MG tablet Take 1 tablet (25 mg total) by mouth daily.   [DISCONTINUED] lisinopril (ZESTRIL) 5 MG tablet Take 1 tablet (5 mg total) by mouth daily.   [DISCONTINUED] potassium chloride SA (KLOR-CON M20) 20 MEQ tablet Take 1 tablet by mouth with food twice a day 15 day(s)   No facility-administered encounter medications on file as of 08/31/2021.    Physical Exam: Blood pressure 116/70, pulse 60, temperature 98 F (36.7 C), temperature source Oral, height 5\' 9"  (1.753 m), weight 135 lb 6.4 oz (61.4 kg), SpO2 99 %. Gen:      No acute distress HEENT:  EOMI, sclera anicteric Neck:     No masses; no thyromegaly Lungs:    Clear to auscultation bilaterally; normal respiratory effort CV:         Regular rate and rhythm; no murmurs Abd:      + bowel sounds; soft, non-tender; no palpable masses, no distension Ext:    No edema; adequate peripheral perfusion Skin:      Warm and dry; no  rash Neuro: alert and oriented x 3 Psych: normal mood and affect   Data Reviewed: Imaging: CT chest 03/09/2020- mild emphysema, 5 proximal 6 mm lung nodule, coronary atherosclerosis.  Screening CT chest 04/10/2021-mild centrilobular emphysema, mild biapical scarring stable pulmonary nodule  I have reviewed the images personally.  PFTs: 04/10/2021 FVC 3.56 [93%], FEV1 2.22 [76%], F/F 63, TLC 7.23 [124%], DLCO 18.57 [79%] Moderate obstruction with bronchodilator response  Labs:  Assessment:  COPD, emphysema PFTs reviewed  with moderate obstruction bronchodilator response Check CBC differential, IgE, alpha-1 antitrypsin levels and phenotype Start Trelegy inhaler  Lung nodule Continue annual low-dose screening CT  Active smoker Discussed smoking cessation but she is not ready to quit.  Reevaluate at return visit Time spent counseling-5 minutes  Plan/Recommendations: CBC, IgE, alpha-1 antitrypsin Start Trelegy Smoking cessation  Marshell Garfinkel MD [facility] 08/31/2021, 8:57 AM  CC: Rankins, Bill Salinas, MD

## 2021-08-31 NOTE — Patient Instructions (Signed)
We will check some labs today including CBC differential, IgE, alpha-1 antitrypsin levels and phenotype Start Trelegy 200 inhaler Continue to work on smoking cessation  Follow-up in 3 months.

## 2021-09-01 ENCOUNTER — Encounter: Payer: Self-pay | Admitting: Pulmonary Disease

## 2021-09-01 ENCOUNTER — Other Ambulatory Visit (HOSPITAL_COMMUNITY): Payer: Self-pay

## 2021-09-04 ENCOUNTER — Other Ambulatory Visit (HOSPITAL_COMMUNITY): Payer: Self-pay

## 2021-09-12 LAB — ALPHA-1 ANTITRYPSIN PHENOTYPE: A-1 Antitrypsin, Ser: 205 mg/dL — ABNORMAL HIGH (ref 83–199)

## 2021-09-12 LAB — IGE: IgE (Immunoglobulin E), Serum: 20 kU/L (ref <=114)

## 2021-09-19 ENCOUNTER — Other Ambulatory Visit (HOSPITAL_COMMUNITY): Payer: Self-pay

## 2021-09-20 DIAGNOSIS — R7303 Prediabetes: Secondary | ICD-10-CM | POA: Diagnosis not present

## 2021-09-20 DIAGNOSIS — I10 Essential (primary) hypertension: Secondary | ICD-10-CM | POA: Diagnosis not present

## 2021-09-20 DIAGNOSIS — E7800 Pure hypercholesterolemia, unspecified: Secondary | ICD-10-CM | POA: Diagnosis not present

## 2021-09-26 ENCOUNTER — Other Ambulatory Visit (HOSPITAL_COMMUNITY): Payer: Self-pay

## 2021-09-26 DIAGNOSIS — R7303 Prediabetes: Secondary | ICD-10-CM | POA: Diagnosis not present

## 2021-09-26 DIAGNOSIS — I10 Essential (primary) hypertension: Secondary | ICD-10-CM | POA: Diagnosis not present

## 2021-09-26 DIAGNOSIS — E785 Hyperlipidemia, unspecified: Secondary | ICD-10-CM | POA: Diagnosis not present

## 2021-09-26 DIAGNOSIS — E871 Hypo-osmolality and hyponatremia: Secondary | ICD-10-CM | POA: Diagnosis not present

## 2021-09-26 MED ORDER — TRELEGY ELLIPTA 200-62.5-25 MCG/ACT IN AEPB
INHALATION_SPRAY | RESPIRATORY_TRACT | 1 refills | Status: DC
  Filled 2021-09-26: qty 60, 30d supply, fill #0
  Filled 2021-10-30: qty 60, 30d supply, fill #1

## 2021-10-19 ENCOUNTER — Other Ambulatory Visit (HOSPITAL_COMMUNITY): Payer: Self-pay

## 2021-10-30 ENCOUNTER — Other Ambulatory Visit (HOSPITAL_COMMUNITY): Payer: Self-pay

## 2021-11-01 DIAGNOSIS — Z86007 Personal history of in-situ neoplasm of skin: Secondary | ICD-10-CM | POA: Diagnosis not present

## 2021-11-01 DIAGNOSIS — R208 Other disturbances of skin sensation: Secondary | ICD-10-CM | POA: Diagnosis not present

## 2021-11-01 DIAGNOSIS — Z08 Encounter for follow-up examination after completed treatment for malignant neoplasm: Secondary | ICD-10-CM | POA: Diagnosis not present

## 2021-11-01 DIAGNOSIS — L538 Other specified erythematous conditions: Secondary | ICD-10-CM | POA: Diagnosis not present

## 2021-11-01 DIAGNOSIS — L84 Corns and callosities: Secondary | ICD-10-CM | POA: Diagnosis not present

## 2021-11-01 DIAGNOSIS — L570 Actinic keratosis: Secondary | ICD-10-CM | POA: Diagnosis not present

## 2021-11-01 DIAGNOSIS — Z85828 Personal history of other malignant neoplasm of skin: Secondary | ICD-10-CM | POA: Diagnosis not present

## 2021-11-13 ENCOUNTER — Other Ambulatory Visit (HOSPITAL_COMMUNITY): Payer: Self-pay

## 2021-11-13 ENCOUNTER — Other Ambulatory Visit: Payer: Self-pay

## 2021-11-13 ENCOUNTER — Encounter: Payer: Self-pay | Admitting: Pulmonary Disease

## 2021-11-13 ENCOUNTER — Ambulatory Visit: Payer: PPO | Admitting: Pulmonary Disease

## 2021-11-13 VITALS — BP 116/64 | HR 60 | Temp 97.9°F | Ht 69.0 in | Wt 136.2 lb

## 2021-11-13 DIAGNOSIS — J449 Chronic obstructive pulmonary disease, unspecified: Secondary | ICD-10-CM | POA: Diagnosis not present

## 2021-11-13 DIAGNOSIS — F17210 Nicotine dependence, cigarettes, uncomplicated: Secondary | ICD-10-CM | POA: Diagnosis not present

## 2021-11-13 MED ORDER — TRELEGY ELLIPTA 200-62.5-25 MCG/ACT IN AEPB
INHALATION_SPRAY | RESPIRATORY_TRACT | 2 refills | Status: DC
  Filled 2021-11-13: qty 60, fill #0
  Filled 2021-11-21: qty 60, 30d supply, fill #0
  Filled 2021-12-25: qty 60, 30d supply, fill #1
  Filled 2022-01-20: qty 60, 30d supply, fill #2

## 2021-11-13 NOTE — Progress Notes (Signed)
Ashley Robertson    [id number]    11-11-1956  Primary Care Physician:Rankins, Bill Salinas, MD  Referring Physician: Aretta Nip, [place],  [place] [address]  Chief complaint: Follow up for COPD  HPI: Active smoker with history of hypertension, hyperlipidemia, emphysema Referred for evaluation of mild wheezing heard at primary care office  Symptoms are intermittent wheezing.  Occasional cough with white mucus.  No symptoms of dyspnea or exercise limitation.  She has an albuterol inhaler which she uses once a month.  No history of exacerbations or hospitalizations for respiratory issues  Pets: Dog Occupation: Retired Haematologist at [employer]: No mold, hot tub, Jacuzzi.  No feather pillows or comforter Smoking history: 41-pack-year smoker.  Continues to smoke 1 pack/day Travel history: No significant travel history Relevant family history: No family history of lung disease  Interim history: She was started on Trelegy inhaler at last visit which is helping with the breathing Continues to have chronic dyspnea on exertion Continues to smoke 1 pack/day  Outpatient Encounter Medications as of 11/13/2021  Medication Sig   albuterol (PROVENTIL HFA) 108 (90 Base) MCG/ACT inhaler Inhale 1-2 puffs by mouth as needed every 6 hours   amLODipine (NORVASC) 10 MG tablet TAKE 1 TABLET BY MOUTH AT BEDTIME   aspirin 81 MG tablet Take 81 mg by mouth daily.   Calcium Carb-Cholecalciferol (CALCIUM 500 + D3 PO) Take 1 tablet by mouth daily.   Cholecalciferol (VITAMIN D) 50 MCG (2000 UT) CAPS Take by mouth.   FLUoxetine (PROZAC) 20 MG capsule Take 1 capsule by mouth daily   Fluticasone-Umeclidin-Vilant (TRELEGY ELLIPTA) 200-62.5-25 MCG/ACT AEPB Inhale 1 puff into the lungs daily   OVER THE COUNTER MEDICATION Serrapeptase 120,000 units dailyReported on 12/27/2015   rosuvastatin (CRESTOR) 5 MG tablet Take 1 tablet by mouth daily at bedtime ,except on Monday,  Wednesday and Fridays take 2 tablets at bedtime as directed   Specialty Vitamins Products (MAGNESIUM, AMINO ACID CHELATE,) 133 MG tablet Take 1 tablet by mouth at bedtime. PURE magnesium supplement - 120 mg of mag per capsule   [DISCONTINUED] albuterol (PROVENTIL HFA;VENTOLIN HFA) 108 (90 BASE) MCG/ACT inhaler Inhale 2 puffs into the lungs every 4 (four) hours as needed for wheezing or shortness of breath.   [DISCONTINUED] chlorthalidone (HYGROTON) 25 MG tablet Take one tablet by mouth daily.   [DISCONTINUED] FLUoxetine (PROZAC) 20 MG capsule Take 1 capsule by mouth once a day   [DISCONTINUED] minocycline (MINOCIN) 100 MG capsule Take 1 capsule (100 mg) by mouth every 12 hours   [DISCONTINUED] hydrochlorothiazide (HYDRODIURIL) 25 MG tablet Take 1 tablet (25 mg total) by mouth daily.   [DISCONTINUED] lisinopril (ZESTRIL) 5 MG tablet Take 1 tablet (5 mg total) by mouth daily.   No facility-administered encounter medications on file as of 11/13/2021.    Physical Exam: Gen:      No acute distress HEENT:  EOMI, sclera anicteric Neck:     No masses; no thyromegaly Lungs:    Clear to auscultation bilaterally; normal respiratory effort CV:         Regular rate and rhythm; no murmurs Abd:      + bowel sounds; soft, non-tender; no palpable masses, no distension Ext:    No edema; adequate peripheral perfusion Skin:      Warm and dry; no rash Neuro: alert and oriented x 3 Psych: normal mood and affect   Data Reviewed: Imaging: CT chest 03/09/2020-  mild emphysema, 5 proximal 6 mm lung nodule, coronary atherosclerosis.  Screening CT chest 04/10/2021-mild centrilobular emphysema, mild biapical scarring stable pulmonary nodule  I have reviewed the images personally.  PFTs: 04/10/2021 FVC 3.56 [93%], FEV1 2.22 [76%], F/F 63, TLC 7.23 [124%], DLCO 18.57 [79%] Moderate obstruction with bronchodilator response  Labs: CBC 08/31/2021- WBC 7.5, eos 1.5%, absolute eosinophil count 112.5 IgE 08/31/2021-  20 Alpha-1 antitrypsin 08/31/2021- 205, PI MM  Assessment:  COPD, emphysema PFTs reviewed with moderate obstruction bronchodilator response Continue Trelegy inhaler  Lung nodule Continue annual low-dose screening CT  Active smoker Discussed smoking cessation again.  She is not ready to quit and does not want a referral to the cessation clinic.  Time spent counseling-5 minutes.  Reevaluate at return visit.  Plan/Recommendations: Continue Trelegy Smoking cessation  Marshell Garfinkel MD  Pulmonary and Critical Care 11/13/2021, 8:57 AM  CC: Rankins, Bill Salinas, MD

## 2021-11-13 NOTE — Addendum Note (Signed)
Addended by: Elton Sin on: 11/13/2021 09:26 AM   Modules accepted: Orders

## 2021-11-13 NOTE — Patient Instructions (Signed)
I am glad you are doing well with regard to your breathing Continue the Trelegy inhaler.  We will renew the prescription Continue to work on smoking cessation  Follow-up in 6 months.

## 2021-11-21 ENCOUNTER — Other Ambulatory Visit (HOSPITAL_COMMUNITY): Payer: Self-pay

## 2021-11-22 ENCOUNTER — Other Ambulatory Visit (HOSPITAL_COMMUNITY): Payer: Self-pay

## 2021-12-03 DIAGNOSIS — C50919 Malignant neoplasm of unspecified site of unspecified female breast: Secondary | ICD-10-CM

## 2021-12-03 HISTORY — DX: Malignant neoplasm of unspecified site of unspecified female breast: C50.919

## 2021-12-08 ENCOUNTER — Other Ambulatory Visit (HOSPITAL_COMMUNITY): Payer: Self-pay

## 2021-12-25 ENCOUNTER — Other Ambulatory Visit (HOSPITAL_COMMUNITY): Payer: Self-pay

## 2022-01-20 ENCOUNTER — Other Ambulatory Visit (HOSPITAL_COMMUNITY): Payer: Self-pay

## 2022-01-21 ENCOUNTER — Other Ambulatory Visit (HOSPITAL_COMMUNITY): Payer: Self-pay

## 2022-01-23 ENCOUNTER — Other Ambulatory Visit (HOSPITAL_COMMUNITY): Payer: Self-pay

## 2022-01-24 ENCOUNTER — Other Ambulatory Visit (HOSPITAL_COMMUNITY): Payer: Self-pay

## 2022-01-24 DIAGNOSIS — N6310 Unspecified lump in the right breast, unspecified quadrant: Secondary | ICD-10-CM | POA: Diagnosis not present

## 2022-01-25 ENCOUNTER — Other Ambulatory Visit (HOSPITAL_COMMUNITY): Payer: Self-pay

## 2022-01-25 MED ORDER — ALBUTEROL SULFATE HFA 108 (90 BASE) MCG/ACT IN AERS
INHALATION_SPRAY | RESPIRATORY_TRACT | 2 refills | Status: AC
  Filled 2022-01-25: qty 8.5, 25d supply, fill #0
  Filled 2022-07-26 – 2022-11-29 (×2): qty 6.7, 25d supply, fill #1

## 2022-01-26 ENCOUNTER — Other Ambulatory Visit: Payer: Self-pay | Admitting: Family Medicine

## 2022-01-26 DIAGNOSIS — N6310 Unspecified lump in the right breast, unspecified quadrant: Secondary | ICD-10-CM

## 2022-02-08 ENCOUNTER — Other Ambulatory Visit: Payer: Self-pay

## 2022-02-08 ENCOUNTER — Ambulatory Visit
Admission: RE | Admit: 2022-02-08 | Discharge: 2022-02-08 | Disposition: A | Discharge status: Home / Self Care | Payer: PPO | Source: Ambulatory Visit | Attending: Family Medicine | Admitting: Family Medicine

## 2022-02-08 ENCOUNTER — Other Ambulatory Visit: Payer: Self-pay | Admitting: Family Medicine

## 2022-02-08 DIAGNOSIS — R599 Enlarged lymph nodes, unspecified: Secondary | ICD-10-CM

## 2022-02-08 DIAGNOSIS — N6310 Unspecified lump in the right breast, unspecified quadrant: Secondary | ICD-10-CM

## 2022-02-08 DIAGNOSIS — R922 Inconclusive mammogram: Secondary | ICD-10-CM | POA: Diagnosis not present

## 2022-02-09 ENCOUNTER — Ambulatory Visit
Admission: RE | Admit: 2022-02-09 | Discharge: 2022-02-09 | Disposition: A | Discharge status: Home / Self Care | Payer: PPO | Source: Ambulatory Visit | Attending: Family Medicine | Admitting: Family Medicine

## 2022-02-09 ENCOUNTER — Other Ambulatory Visit: Payer: Self-pay | Admitting: Family Medicine

## 2022-02-09 DIAGNOSIS — R922 Inconclusive mammogram: Secondary | ICD-10-CM | POA: Diagnosis not present

## 2022-02-09 DIAGNOSIS — R590 Localized enlarged lymph nodes: Secondary | ICD-10-CM | POA: Diagnosis not present

## 2022-02-09 DIAGNOSIS — N6310 Unspecified lump in the right breast, unspecified quadrant: Secondary | ICD-10-CM

## 2022-02-09 DIAGNOSIS — C773 Secondary and unspecified malignant neoplasm of axilla and upper limb lymph nodes: Secondary | ICD-10-CM | POA: Diagnosis not present

## 2022-02-09 DIAGNOSIS — R599 Enlarged lymph nodes, unspecified: Secondary | ICD-10-CM

## 2022-02-09 DIAGNOSIS — C50411 Malignant neoplasm of upper-outer quadrant of right female breast: Secondary | ICD-10-CM | POA: Diagnosis not present

## 2022-02-09 DIAGNOSIS — C50811 Malignant neoplasm of overlapping sites of right female breast: Secondary | ICD-10-CM | POA: Diagnosis not present

## 2022-02-09 DIAGNOSIS — N6315 Unspecified lump in the right breast, overlapping quadrants: Secondary | ICD-10-CM | POA: Diagnosis not present

## 2022-02-09 DIAGNOSIS — N6311 Unspecified lump in the right breast, upper outer quadrant: Secondary | ICD-10-CM | POA: Diagnosis not present

## 2022-02-13 ENCOUNTER — Telehealth: Payer: Self-pay | Admitting: Hematology

## 2022-02-13 NOTE — Telephone Encounter (Signed)
Spoke to patient to confirm morning clinic appointment for 3/22, packet will be mailed ?

## 2022-02-16 ENCOUNTER — Encounter: Payer: Self-pay | Admitting: *Deleted

## 2022-02-16 DIAGNOSIS — C50911 Malignant neoplasm of unspecified site of right female breast: Secondary | ICD-10-CM | POA: Insufficient documentation

## 2022-02-16 DIAGNOSIS — C50811 Malignant neoplasm of overlapping sites of right female breast: Secondary | ICD-10-CM

## 2022-02-19 ENCOUNTER — Other Ambulatory Visit (HOSPITAL_COMMUNITY): Payer: Self-pay

## 2022-02-19 NOTE — Progress Notes (Signed)
?Radiation Oncology         [phone] ?________________________________ ? ?Name: Ashley Robertson        MRN: [id number]  ?Date of Service: 02/21/2022 DOB: 1956/03/13 ? ?ZS:WFUXNAT, Bill Salinas, MD  Rolm Bookbinder, MD    ? ?REFERRING PHYSICIAN: Rolm Bookbinder, MD ? ? ?DIAGNOSIS: The encounter diagnosis was Malignant neoplasm of overlapping sites of right breast in female, estrogen receptor positive ([place]). ? ? ?HISTORY OF PRESENT ILLNESS: Ashley Robertson is a 66 y.o. female seen in the multidisciplinary breast clinic for a new diagnosis of right breast cancer.  The patient was found to have a palpable mass in the right breast.  By diagnostic imaging in the 11 o'clock position a 3.3 cm mass was seen, and the 6 o'clock position also of the right breast there was a 2.6 cm mass.  She has 1 abnormal appearing axillary lymph node.  She underwent biopsies showing a grade 2 invasive lobular carcinoma that was ER positive PR negative, HER2 amplified with a Ki-67 of 10%.  Her lymph node was also biopsied positive with the same prognostics.  She is seen today to discuss treatment recommendations of her cancer. ? ? ? ?PREVIOUS RADIATION THERAPY: No ? ? ?PAST MEDICAL HISTORY:  ?Past Medical History:  ?Diagnosis Date  ? Anxiety and depression   ? Aortic atherosclerosis ([place]) 03/2020  ? CT Chest: 2 V (LAD & LCx) Coronary Atherosclerosis, Aortic Atherosclerosis (no aneurysm).  Mild centrilobular emphysema with mild diffuse bronchial thickening; several scattered small solitary pulmonary nodules (largest 5.6 mm in anterior left upper lobe)  ? Carotid artery plaque, bilateral 10/2015  ? Mild to moderate plaque L>R without significant stenosis  ? Coronary Artery Calcification - Score 79   ? Coronary Calcium Score 79.  LAD and circumflex calcification noted.  Normal ascending aorta with mild calcification.  ? Current every day smoker   ? Emphysema lung ([place])   ? Noted on chest CT  ? Hyperlipidemia   ? Prediabetes   ?    ? ? ?PAST SURGICAL HISTORY: ?Past Surgical History:  ?Procedure Laterality Date  ? [place]  ? RHINOPLASTY  1981  ? TONSILLECTOMY  1978  ? TRANSTHORACIC ECHOCARDIOGRAM  10/2016  ? EF 55 to 60%.  Normal systolic and diastolic function.  No ASD/PFO  ? ? ? ?FAMILY HISTORY:  ?Family History  ?Problem Relation Age of Onset  ? Transient ischemic attack Mother 63  ? Parkinson's disease Father   ? Hyperlipidemia Father   ? Hypertension Father   ? Cancer Father   ?     Left shoulder  ? Healthy Sister   ? Healthy Brother   ? Healthy Brother   ? Healthy Brother   ? Hypertension Sister   ? ? ? ?SOCIAL HISTORY:  reports that she has been smoking cigarettes. She has a 40.00 pack-year smoking history. She has never used smokeless tobacco. She reports current alcohol use of about 2.0 standard drinks per week. She reports that she does not use drugs. The patient is married and lives in [place]. 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 She's accompanied by her daughter [name].  ? ? ?ALLERGIES: Lisinopril and Lipitor [atorvastatin] ? ? ?MEDICATIONS:  ?Current Outpatient Medications  ?Medication Sig Dispense Refill  ? albuterol (PROVENTIL HFA) 108 (90 Base) MCG/ACT inhaler Inhale 1-2 puffs by mouth as needed every 6 hours 8.5 g 0  ? albuterol (PROVENTIL HFA) 108 (  90 Base) MCG/ACT inhaler Inhale 1 to 2 puffs into the lungs every 6 hours as needed 8.5 g 2  ? amLODipine (NORVASC) 10 MG tablet TAKE 1 TABLET BY MOUTH AT BEDTIME 30 tablet 6  ? aspirin 81 MG tablet Take 81 mg by mouth daily.    ? Calcium Carb-Cholecalciferol (CALCIUM 500 + D3 PO) Take 1 tablet by mouth daily.    ? Cholecalciferol (VITAMIN D) 50 MCG (2000 UT) CAPS Take by mouth.    ? FLUoxetine (PROZAC) 20 MG capsule Take 1 capsule by mouth daily 90 capsule 0  ? Fluticasone-Umeclidin-Vilant (TRELEGY ELLIPTA) 200-62.5-25 MCG/ACT AEPB Inhale 1 puff into the lungs daily 60 each 2  ? OVER THE COUNTER MEDICATION  Serrapeptase 120,000 units dailyReported on 12/27/2015    ? rosuvastatin (CRESTOR) 5 MG tablet Take 1 tablet by mouth daily at bedtime ,except on Monday, Wednesday and Fridays take 2 tablets at bedtime as directed 180 tablet 3  ? Specialty Vitamins Products (MAGNESIUM, AMINO ACID CHELATE,) 133 MG tablet Take 1 tablet by mouth at bedtime. PURE magnesium supplement - 120 mg of mag per capsule    ? ?No current facility-administered medications for this visit.  ? ? ? ?REVIEW OF SYSTEMS: On review of systems, the patient reports that she is doing well overall and no specific breast complaints are verbalized.  ? ?  ? ?PHYSICAL EXAM:  ?Wt Readings from Last 3 Encounters:  ?11/13/21 136 lb 3.2 oz (61.8 kg)  ?08/31/21 135 lb 6.4 oz (61.4 kg)  ?06/09/21 145 lb (65.8 kg)  ? ?Temp Readings from Last 3 Encounters:  ?11/13/21 97.9 ?F (36.6 ?C) (Oral)  ?08/31/21 98 ?F (36.7 ?C) (Oral)  ?03/22/21 97.7 ?F (36.5 ?C) (Temporal)  ? ?BP Readings from Last 3 Encounters:  ?11/13/21 116/64  ?08/31/21 116/70  ?06/09/21 133/80  ? ?Pulse Readings from Last 3 Encounters:  ?11/13/21 60  ?08/31/21 60  ?06/09/21 (!) 59  ? ? ?In general this is a well appearing caucasian female in no acute distress. She's alert and oriented x4 and appropriate throughout the examination. Cardiopulmonary assessment is negative for acute distress and she exhibits normal effort. Bilateral breast exam is deferred. ? ? ? ?ECOG = 0 ? ?0 - Asymptomatic (Fully active, able to carry on all predisease activities without restriction) ? ?1 - Symptomatic but completely ambulatory (Restricted in physically strenuous activity but ambulatory and able to carry out work of a light or sedentary nature. For example, light housework, office work) ? ?2 - Symptomatic, <50% in bed during the day (Ambulatory and capable of all self care but unable to carry out any work activities. Up and about more than 50% of waking hours) ? ?3 - Symptomatic, >50% in bed, but not bedbound (Capable of only  limited self-care, confined to bed or chair 50% or more of waking hours) ? ?4 - Bedbound (Completely disabled. Cannot carry on any self-care. Totally confined to bed or chair) ? ?5 - Death ? ? Oken MM, Creech RH, Tormey DC, et al. [phone]). "Toxicity and response criteria of the [facility]". [place] Oncol. 5 (6): 649-55 ? ? ? ?LABORATORY DATA:  ?Lab Results  ?Component Value Date  ? WBC 7.5 08/31/2021  ? HGB 15.1 (H) 08/31/2021  ? HCT 44.3 08/31/2021  ? MCV 97.3 08/31/2021  ? PLT 317.0 08/31/2021  ? ?Lab Results  ?Component Value Date  ? NA 135 07/24/2021  ? K 3.7 07/24/2021  ? CL 87 (L) 07/24/2021  ?  CO2 28 07/24/2021  ? ?Lab Results  ?Component Value Date  ? ALT 10 08/03/2020  ? AST 19 08/03/2020  ? ALKPHOS 88 08/03/2020  ? BILITOT 0.4 08/03/2020  ? ?  ? ?RADIOGRAPHY: US BREAST LTD UNI RIGHT INC AXILLA ? ?Result Date: 02/08/2022 ?CLINICAL DATA:  Acute onset right breast lump. EXAM: DIGITAL DIAGNOSTIC UNILATERAL RIGHT MAMMOGRAM WITH TOMOSYNTHESIS AND CAD; ULTRASOUND RIGHT BREAST LIMITED TECHNIQUE: Right digital diagnostic mammography and breast tomosynthesis was performed. The images were evaluated with computer-aided detection.; Targeted ultrasound examination of the right breast was performed COMPARISON:  Previous exam(s). ACR Breast Density Category c: The breast tissue is heterogeneously dense, which may obscure small masses. FINDINGS: There is diffuse increased density throughout the right breast, particularly centrally. There is diffuse skin thickening. The right breast is much smaller in appearance today compared to the previous study. No other suspicious findings. On physical exam, no suspicious lumps are identified. Targeted ultrasound is performed, showing an ill-defined hypoechoic mass in the region of the patient's symptoms at 11 o'clock, 6 cm from the nipple measuring 2.1 x 3.3 x 2.0 cm. Another discrete mass is seen at 6 o'clock, 3 cm from the nipple measuring 1.2 x 1.1 x 2.6  cm. No definitive abnormal nodes are seen in the right axilla. However, there is a single lymph node with a cortex measuring 3.1 mm. The remainder of the nodes demonstrate cortices measuring closer to 1 mm. IMPRESSI

## 2022-02-20 ENCOUNTER — Other Ambulatory Visit: Payer: Self-pay | Admitting: Pulmonary Disease

## 2022-02-20 ENCOUNTER — Other Ambulatory Visit (HOSPITAL_COMMUNITY): Payer: Self-pay

## 2022-02-20 MED ORDER — TRELEGY ELLIPTA 200-62.5-25 MCG/ACT IN AEPB
INHALATION_SPRAY | RESPIRATORY_TRACT | 2 refills | Status: DC
  Filled 2022-02-20: qty 60, 30d supply, fill #0
  Filled 2022-03-27: qty 60, 30d supply, fill #1
  Filled 2022-04-24: qty 60, 30d supply, fill #2

## 2022-02-21 ENCOUNTER — Encounter: Payer: Self-pay | Admitting: Genetic Counselor

## 2022-02-21 ENCOUNTER — Other Ambulatory Visit (HOSPITAL_COMMUNITY): Payer: Self-pay

## 2022-02-21 ENCOUNTER — Ambulatory Visit: Payer: PPO | Attending: General Surgery | Admitting: Physical Therapy

## 2022-02-21 ENCOUNTER — Encounter: Payer: Self-pay | Admitting: Hematology

## 2022-02-21 ENCOUNTER — Ambulatory Visit
Admission: RE | Admit: 2022-02-21 | Discharge: 2022-02-21 | Disposition: A | Discharge status: Home / Self Care | Payer: PPO | Source: Ambulatory Visit | Attending: Radiation Oncology | Admitting: Radiation Oncology

## 2022-02-21 ENCOUNTER — Encounter: Payer: Self-pay | Admitting: *Deleted

## 2022-02-21 ENCOUNTER — Other Ambulatory Visit: Payer: Self-pay | Admitting: *Deleted

## 2022-02-21 ENCOUNTER — Other Ambulatory Visit: Payer: Self-pay

## 2022-02-21 ENCOUNTER — Inpatient Hospital Stay: Payer: PPO | Admitting: Licensed Clinical Social Worker

## 2022-02-21 ENCOUNTER — Encounter: Payer: Self-pay | Admitting: Physical Therapy

## 2022-02-21 ENCOUNTER — Inpatient Hospital Stay (HOSPITAL_BASED_OUTPATIENT_CLINIC_OR_DEPARTMENT_OTHER): Payer: PPO | Admitting: Genetic Counselor

## 2022-02-21 ENCOUNTER — Inpatient Hospital Stay (HOSPITAL_BASED_OUTPATIENT_CLINIC_OR_DEPARTMENT_OTHER): Payer: PPO | Admitting: Hematology

## 2022-02-21 ENCOUNTER — Inpatient Hospital Stay: Payer: PPO | Attending: Hematology

## 2022-02-21 ENCOUNTER — Inpatient Hospital Stay: Payer: PPO | Admitting: Emergency Medicine

## 2022-02-21 VITALS — BP 144/77 | HR 66 | Temp 97.6°F | Resp 18 | Ht 69.0 in | Wt 142.6 lb

## 2022-02-21 DIAGNOSIS — C50811 Malignant neoplasm of overlapping sites of right female breast: Secondary | ICD-10-CM

## 2022-02-21 DIAGNOSIS — C773 Secondary and unspecified malignant neoplasm of axilla and upper limb lymph nodes: Secondary | ICD-10-CM | POA: Insufficient documentation

## 2022-02-21 DIAGNOSIS — Z170 Estrogen receptor positive status [ER+]: Secondary | ICD-10-CM

## 2022-02-21 DIAGNOSIS — F429 Obsessive-compulsive disorder, unspecified: Secondary | ICD-10-CM | POA: Diagnosis not present

## 2022-02-21 DIAGNOSIS — Z79899 Other long term (current) drug therapy: Secondary | ICD-10-CM | POA: Diagnosis not present

## 2022-02-21 DIAGNOSIS — I10 Essential (primary) hypertension: Secondary | ICD-10-CM | POA: Insufficient documentation

## 2022-02-21 DIAGNOSIS — Z8042 Family history of malignant neoplasm of prostate: Secondary | ICD-10-CM | POA: Diagnosis not present

## 2022-02-21 DIAGNOSIS — Z7982 Long term (current) use of aspirin: Secondary | ICD-10-CM | POA: Diagnosis not present

## 2022-02-21 DIAGNOSIS — R293 Abnormal posture: Secondary | ICD-10-CM

## 2022-02-21 DIAGNOSIS — E7800 Pure hypercholesterolemia, unspecified: Secondary | ICD-10-CM | POA: Insufficient documentation

## 2022-02-21 DIAGNOSIS — C50411 Malignant neoplasm of upper-outer quadrant of right female breast: Secondary | ICD-10-CM | POA: Diagnosis not present

## 2022-02-21 DIAGNOSIS — Z803 Family history of malignant neoplasm of breast: Secondary | ICD-10-CM | POA: Insufficient documentation

## 2022-02-21 DIAGNOSIS — J449 Chronic obstructive pulmonary disease, unspecified: Secondary | ICD-10-CM | POA: Insufficient documentation

## 2022-02-21 DIAGNOSIS — F17210 Nicotine dependence, cigarettes, uncomplicated: Secondary | ICD-10-CM | POA: Insufficient documentation

## 2022-02-21 HISTORY — DX: Family history of malignant neoplasm of prostate: Z80.42

## 2022-02-21 HISTORY — DX: Family history of malignant neoplasm of breast: Z80.3

## 2022-02-21 LAB — CMP (CANCER CENTER ONLY)
ALT: 14 U/L (ref 0–44)
AST: 18 U/L (ref 15–41)
Albumin: 4.3 g/dL (ref 3.5–5.0)
Alkaline Phosphatase: 85 U/L (ref 38–126)
Anion gap: 7 (ref 5–15)
BUN: 18 mg/dL (ref 8–23)
CO2: 26 mmol/L (ref 22–32)
Calcium: 9.5 mg/dL (ref 8.9–10.3)
Chloride: 102 mmol/L (ref 98–111)
Creatinine: 0.64 mg/dL (ref 0.44–1.00)
GFR, Estimated: >60 mL/min (ref >60)
Glucose, Bld: 128 mg/dL — ABNORMAL HIGH (ref 70–99)
Potassium: 4.1 mmol/L (ref 3.5–5.1)
Sodium: 135 mmol/L (ref 135–145)
Total Bilirubin: 0.6 mg/dL (ref 0.3–1.2)
Total Protein: 7.3 g/dL (ref 6.5–8.1)

## 2022-02-21 LAB — CBC WITH DIFFERENTIAL (CANCER CENTER ONLY)
Abs Immature Granulocytes: 0.04 10*3/uL (ref 0.00–0.07)
Basophils Absolute: 0.1 10*3/uL (ref 0.0–0.1)
Basophils Relative: 1 %
Eosinophils Absolute: 0.2 10*3/uL (ref 0.0–0.5)
Eosinophils Relative: 1 %
HCT: 47.4 % — ABNORMAL HIGH (ref 36.0–46.0)
Hemoglobin: 16.2 g/dL — ABNORMAL HIGH (ref 12.0–15.0)
Immature Granulocytes: 0 %
Lymphocytes Relative: 13 %
Lymphs Abs: 1.5 10*3/uL (ref 0.7–4.0)
MCH: 33.1 pg (ref 26.0–34.0)
MCHC: 34.2 g/dL (ref 30.0–36.0)
MCV: 96.9 fL (ref 80.0–100.0)
Monocytes Absolute: 1 10*3/uL (ref 0.1–1.0)
Monocytes Relative: 9 %
Neutro Abs: 8.6 10*3/uL — ABNORMAL HIGH (ref 1.7–7.7)
Neutrophils Relative %: 76 %
Platelet Count: 267 10*3/uL (ref 150–400)
RBC: 4.89 MIL/uL (ref 3.87–5.11)
RDW: 14.2 % (ref 11.5–15.5)
WBC Count: 11.4 10*3/uL — ABNORMAL HIGH (ref 4.0–10.5)
nRBC: 0 % (ref 0.0–0.2)

## 2022-02-21 LAB — GENETIC SCREENING ORDER

## 2022-02-21 MED ORDER — DEXAMETHASONE 4 MG PO TABS
4.0000 mg | ORAL_TABLET | Freq: Every day | ORAL | 0 refills | Status: DC
  Filled 2022-02-21: qty 30, 15d supply, fill #0

## 2022-02-21 MED ORDER — ONDANSETRON HCL 8 MG PO TABS
8.0000 mg | ORAL_TABLET | Freq: Two times a day (BID) | ORAL | 1 refills | Status: DC | PRN
  Filled 2022-02-21: qty 18, 21d supply, fill #0

## 2022-02-21 MED ORDER — LIDOCAINE-PRILOCAINE 2.5-2.5 % EX CREA
TOPICAL_CREAM | CUTANEOUS | 3 refills | Status: DC
  Filled 2022-02-21: qty 30, 30d supply, fill #0
  Filled 2022-07-26: qty 30, 30d supply, fill #1

## 2022-02-21 MED ORDER — PROCHLORPERAZINE MALEATE 10 MG PO TABS
10.0000 mg | ORAL_TABLET | Freq: Four times a day (QID) | ORAL | 1 refills | Status: DC | PRN
  Filled 2022-02-21: qty 30, 8d supply, fill #0

## 2022-02-21 NOTE — Progress Notes (Addendum)
?[facility]   ?[phone] [phone]   ?Clinic New Consult Note  ? ?Patient Care Team: ?Rankins, Bill Salinas, MD as PCP - General (Family Medicine) ?Leonie Man, MD as PCP - Cardiology (Cardiology) ?Mauro Kaufmann, RN as Oncology Nurse Navigator ?Rockwell Germany, RN as Oncology Nurse Navigator ?Rolm Bookbinder, MD as Consulting Physician (General Surgery) ?Truitt Merle, MD as Consulting Physician (Hematology) ?Kyung Rudd, MD as Consulting Physician (Radiation Oncology) ? ?Date of Service:  02/21/2022  ? ?CHIEF COMPLAINTS/PURPOSE OF CONSULTATION:  ?Right Breast Cancer, ER+/Her2+ ? ?REFERRING PHYSICIAN:  ?[facility] ? ? ?ASSESSMENT & PLAN:  ?Ashley Robertson is a 66 y.o. postmenopausal female with a history of COPD ? ?1. Malignant neoplasm of overlapping sites of right breast, invasive lobular carcinoma, Stage IIA, c(T2, N1), ER+/PR-/HER2+, Grade 2  ?-presented with new right breast mass. Right MM and [place] on 02/08/22 showed malignancy throughout right breast with discrete masses at 11 and 6 o'clock, and one borderline lymph node. Biopsy on 02/09/22 revealed invasive lobular carcinoma to both sites and metastatic carcinoma in lymph node. She has mass like hard tissue in most part of her right breast but no clinical concerns of inflammatory breast cancer, she also has a large palpable right axillary lymph node. ?--We discussed her imaging findings and the biopsy results in great details. ?-Given her HER2+ disease, she would benefit from chemotherapy.  We discussed chemotherapy can be given before or after surgery.  Our recommendation is for neoadjuvant chemo followed by surgery. The chemo regimen for Her2+ breast cancer consists of taxol, carboplatin, herceptin, and perjeta (TCHP) for 6 cycles, then continue herceptin/perjeta or Kadcyla after surgery to complete a year. We discussed that we will monitor the tumor size either by physical exam ?--Chemotherapy consent: Side  effects including but does not not limited to, fatigue, nausea, vomiting, diarrhea, hair loss, neuropathy, fluid retention, renal and kidney dysfunction, neutropenic fever, needed for blood transfusion, bleeding, reversible cardiomyopathy, were discussed with patient in great detail. She agrees to proceed. ?-The goal of chemotherapy is curative ?-given her multifocal disease, she will likely need mastectomy. She met with Dr. Donne Hazel today and is considering reconstruction. ?-We will obtain staging CT chest, abdomen pelvis with contrast and a bone scan to rule out distant metastasis ?-Giving the strong ER and PR expression in her postmenopausal status, I recommend adjuvant endocrine therapy with aromatase inhibitor for a total of 10 years to reduce the risk of cancer recurrence. Potential benefits and side effects were discussed with patient and she is interested. ?-She was also seen by radiation oncologist Dr. Lisbeth Renshaw today. She will likely benefit from postmastectomy radiation given her biopsy-proven positive lymph node. ?-We also discussed the breast cancer surveillance after her surgery. She will continue annual screening mammogram, self exam, and a routine office visit with lab and exam with [place]. ?-I encouraged her to have healthy diet and exercise regularly.  ? ?2. Bone Health  ?-Her most recent DEXA was on 04/29/20 showing osteopenia (T-score of -2.1 at L1-3). ? ?3. COPD and Smoking Cessation ?-she currently smokes ~1 ppd. I encouraged her to stop as soon as possible to promote improved healing during chemotherapy. ? ?4. HTN and prediabetes, anxiety ?-Continue medication, and follow-up with PCP ?-We will follow-up her blood pressure and blood glucose closely during her chemotherapy ? ?PLAN:  ?-we will schedule: ?-breast MRI ?-baseline echocardiogram ?-chemo education class ?-CT/bone scan ?-port placement ?-Plan to start neoadjuvant chemotherapy TCHP with GCSF on day 3  in about 2 weeks, will slightly reduce carbo  dose on cycle 1 to see how she does  ? ? ?Oncology History Overview Note  ? Cancer Staging  ?Malignant neoplasm of overlapping sites of right breast in female, estrogen receptor positive ([place]) ?Staging form: Breast, AJCC 8th Edition ?- Clinical stage from 02/09/2022: Stage IIA (cT2, cN1, cM0, G2, ER+, PR-, HER2+) - Signed by Truitt Merle, MD on 02/20/2022 ? ?  ?Malignant neoplasm of overlapping sites of right breast in female, estrogen receptor positive ([place])  ?02/08/2022 Mammogram  ? CLINICAL DATA:  Acute onset right breast lump. ? ?EXAM: ?DIGITAL DIAGNOSTIC UNILATERAL RIGHT MAMMOGRAM WITH TOMOSYNTHESIS AND CAD; ULTRASOUND RIGHT BREAST LIMITED ? ?IMPRESSION: ?1. Highly suspicious findings mammographically and sonographically. I suspect there is significant malignancy throughout most of the right breast. Discrete masses are seen at 11 o'clock and 6 o'clock.  ?A single borderline lymph node is identified. This lymph node appears prominent compared to the remainder of the lymph nodes. The skin thickening suggests the possibility of inflammatory breast cancer. ?  ?02/09/2022 Cancer Staging  ? Staging form: Breast, AJCC 8th Edition ?- Clinical stage from 02/09/2022: Stage IIA (cT2, cN1, cM0, G2, ER+, PR-, HER2+) - Signed by Truitt Merle, MD on 02/20/2022 ?Stage prefix: Initial diagnosis ?Histologic grading system: 3 grade system ? ?  ?02/09/2022 Initial Biopsy  ? Diagnosis ?1. Breast, right, needle core biopsy, right breast 11 o'clock mass ribbon clip ?- INVASIVE MAMMARY CARCINOMA ?- SEE COMMENT ?2. Breast, right, needle core biopsy, right breast 6'oclock mass, coil clip ?- INVASIVE MAMMARY CARCINOMA ?- SEE COMMENT ?3. Lymph node, needle/core biopsy, right axillary lymph node, tribell clip ?- METASTATIC CARCINOMA INVOLVING NODAL TISSUE ?Microscopic Comment ?1. and 2. The biopsy material shows an infiltrative proliferation of cells arranged linearly and in small clusters. Based ?on the biopsy, the carcinoma appears Nottingham grade 2  of 3 and measures 1.5 cm in greatest linear extent. ? ?Addendum parts 1 and 2: Immunohistochemistry for E-cadherin is negative consistent with lobular carcinoma. ? ?2. PROGNOSTIC INDICATORS ?Results: ?The tumor cells are EQUIVOCAL for Her2 (2+). Her2 by FISH will be performed and the results reported separately. ?Estrogen Receptor: 100%, POSITIVE, STRONG STAINING INTENSITY ?Progesterone Receptor: <1%, NEGATIVE ?Proliferation Marker Ki67: 10% ? ?2. FLUORESCENCE IN-SITU HYBRIDIZATION ?Results: ?GROUP 1: HER2 **POSITIVE** ? ? ?3. PROGNOSTIC INDICATORS ?Results: ?By immunohistochemistry, the tumor cells are POSITIVE for Her2 (3+). ?Estrogen Receptor: 100%, POSITIVE, STRONG STAINING INTENSITY ?Progesterone Receptor: <1%, NEGATIVE ?  ?02/16/2022 Initial Diagnosis  ? Malignant neoplasm of overlapping sites of right breast in female, estrogen receptor positive ([place]) ?  ? ? ? ?HISTORY OF PRESENTING ILLNESS:  ?Ashley Robertson 66 y.o. female is a here because of breast cancer. The [facility] by [facility]. The patient presents to the clinic today accompanied by her daughter [name]. ? ? ?She presented with a new, palpable right breast mass. Prior screening mammogram on 06/15/21 was negative. She underwent right diagnostic mammography and right breast ultrasonography on 02/08/22 showing: discrete masses at 11 and 6 o'clock with suspected malignancy throughout most of right breast; single borderline lymph node; skin thickening suggesting possibility of inflammatory breast cancer. ? ?Biopsy on 02/09/22 showed: invasive lobular carcinoma, e-cadherin negative, grade 2, present at both 11 and 6 o'clock. Prognostic indicators significant for: estrogen receptor, 100% positive and progesterone receptor, <1% negative. Proliferation marker Ki67 at 10%. HER2 positive by FISH. ? ? ?Today the patient notes they felt/feeling prior/after... ?-she noticed some nipple flattening in February ?-she  denies pain, redness, or other breast  changes. ? ?She has a PMHx of.... ?-HTN ?-COPD, on trelegy daily and albuterol as needed ?-OCD, on prozac for last 25 years ? ?Socially... ?-she is a retired Haematologist, worked at [employer]. ?-she is married wit

## 2022-02-21 NOTE — Progress Notes (Signed)
REFERRING PROVIDER: ?Truitt Merle, MD ?[address] ?[place],  [place] [address] ? ?PRIMARY PROVIDER:  ?Aretta Nip, MD ? ?PRIMARY REASON FOR VISIT:  ?1. Malignant neoplasm of overlapping sites of right breast in female, estrogen receptor positive ([place])   ?2. Family history of breast cancer   ?3. Family history of prostate cancer   ? ? ?HISTORY OF PRESENT ILLNESS:   ?Ashley [place], a 66 y.o. female, was seen for a [facility] cancer genetics consultation at the request of Dr. Burr Medico due to a personal and family history of cancer.  Ashley Robertson presents to clinic today to discuss the possibility of a hereditary predisposition to cancer, to discuss genetic testing, and to further clarify her future cancer risks, as well as potential cancer risks for family members.  ? ?In March 2023, at the age of 60, Ashley Robertson was diagnosed with invasive lobular carcinoma of the right breast. The treatment plan is pending.  She also has a history of squamous cell carcinoma on her left leg.  ? ? ?CANCER HISTORY:  ?Oncology History Overview Note  ? Cancer Staging  ?Malignant neoplasm of overlapping sites of right breast in female, estrogen receptor positive ([place]) ?Staging form: Breast, AJCC 8th Edition ?- Clinical stage from 02/09/2022: Stage IIA (cT2, cN1, cM0, G2, ER+, PR-, HER2+) - Signed by Truitt Merle, MD on 02/20/2022 ? ?  ?Malignant neoplasm of overlapping sites of right breast in female, estrogen receptor positive ([place])  ?02/08/2022 Mammogram  ? CLINICAL DATA:  Acute onset right breast lump. ? ?EXAM: ?DIGITAL DIAGNOSTIC UNILATERAL RIGHT MAMMOGRAM WITH TOMOSYNTHESIS AND CAD; ULTRASOUND RIGHT BREAST LIMITED ? ?IMPRESSION: ?1. Highly suspicious findings mammographically and sonographically. I suspect there is significant malignancy throughout most of the right breast. Discrete masses are seen at 11 o'clock and 6 o'clock.  ?A single borderline lymph node is identified. This lymph node appears prominent compared to the remainder  of the lymph nodes. The skin thickening suggests the possibility of inflammatory breast cancer. ?  ?02/09/2022 Cancer Staging  ? Staging form: Breast, AJCC 8th Edition ?- Clinical stage from 02/09/2022: Stage IIA (cT2, cN1, cM0, G2, ER+, PR-, HER2+) - Signed by Truitt Merle, MD on 02/20/2022 ?Stage prefix: Initial diagnosis ?Histologic grading system: 3 grade system ? ?  ?02/09/2022 Initial Biopsy  ? Diagnosis ?1. Breast, right, needle core biopsy, right breast 11 o'clock mass ribbon clip ?- INVASIVE MAMMARY CARCINOMA ?- SEE COMMENT ?2. Breast, right, needle core biopsy, right breast 6'oclock mass, coil clip ?- INVASIVE MAMMARY CARCINOMA ?- SEE COMMENT ?3. Lymph node, needle/core biopsy, right axillary lymph node, tribell clip ?- METASTATIC CARCINOMA INVOLVING NODAL TISSUE ?Microscopic Comment ?1. and 2. The biopsy material shows an infiltrative proliferation of cells arranged linearly and in small clusters. Based ?on the biopsy, the carcinoma appears Nottingham grade 2 of 3 and measures 1.5 cm in greatest linear extent. ? ?Addendum parts 1 and 2: Immunohistochemistry for E-cadherin is negative consistent with lobular carcinoma. ? ?2. PROGNOSTIC INDICATORS ?Results: ?The tumor cells are EQUIVOCAL for Her2 (2+). Her2 by FISH will be performed and the results reported separately. ?Estrogen Receptor: 100%, POSITIVE, STRONG STAINING INTENSITY ?Progesterone Receptor: <1%, NEGATIVE ?Proliferation Marker Ki67: 10% ? ?2. FLUORESCENCE IN-SITU HYBRIDIZATION ?Results: ?GROUP 1: HER2 **POSITIVE** ? ? ?3. PROGNOSTIC INDICATORS ?Results: ?By immunohistochemistry, the tumor cells are POSITIVE for Her2 (3+). ?Estrogen Receptor: 100%, POSITIVE, STRONG STAINING INTENSITY ?Progesterone Receptor: <1%, NEGATIVE ?  ?02/16/2022 Initial Diagnosis  ? Malignant neoplasm of overlapping sites of right breast in female,  estrogen receptor positive ([place]) ?  ?03/07/2022 -  Chemotherapy  ? Patient is on Treatment Plan : BREAST  Docetaxel + Carboplatin +  Trastuzumab + Pertuzumab  (TCHP) q21d   ?   ? ? ?RISK FACTORS:  ?Mammogram within the last year: yes ?Colonoscopy: yes;  most recent 2021 . ?Menarche was at age 32.  ?First live birth at age 19.  ?Menopausal status: postmenopausal.  ?OCP use: in 34s ?HRT use: 0 years. ? ?Past Medical History:  ?Diagnosis Date  ? Allergy   ? Lisinopril and lipitor  ? Anxiety and depression   ? Aortic atherosclerosis ([place]) 03/2020  ? CT Chest: 2 V (LAD & LCx) Coronary Atherosclerosis, Aortic Atherosclerosis (no aneurysm).  Mild centrilobular emphysema with mild diffuse bronchial thickening; several scattered small solitary pulmonary nodules (largest 5.6 mm in anterior left upper lobe)  ? Breast cancer ([place]) 12/2021  ? Right breast  ? Carotid artery plaque, bilateral 10/2015  ? Mild to moderate plaque L>R without significant stenosis  ? COPD (chronic obstructive pulmonary disease) ([place])   ? Coronary Artery Calcification - Score 79   ? Coronary Calcium Score 79.  LAD and circumflex calcification noted.  Normal ascending aorta with mild calcification.  ? Current every day smoker   ? Emphysema lung ([place])   ? Noted on chest CT  ? Family history of breast cancer 02/21/2022  ? Family history of prostate cancer 02/21/2022  ? Hyperlipidemia   ? Hypertension   ? Controlled with amlodipine  ? Prediabetes   ? Skin cancer 2021  ? Remove 2022  ? ? ?Past Surgical History:  ?Procedure Laterality Date  ? [place]  ? [place]  ? Rhinoplasty  ? RHINOPLASTY  1981  ? TONSILLECTOMY  1978  ? TRANSTHORACIC ECHOCARDIOGRAM  10/2016  ? EF 55 to 60%.  Normal systolic and diastolic function.  No ASD/PFO  ? TUBAL LIGATION  1991  ? ? ?FAMILY HISTORY:  ?We obtained a detailed, 4-generation family history.  Significant diagnoses are listed below: ?Family History  ?Problem Relation Age of Onset  ? Transient ischemic attack Mother 9  ? Cancer - Other Father   ?     sarcoma--left shoulder  ? Prostate cancer Father   ?     dx after 49  ?  Lung cancer Brother   ?     dx 21s  ? Skin cancer Maternal Aunt   ? Breast cancer Paternal Aunt   ?     dx after 44  ? Lung cancer Maternal Grandmother   ?     dx 37s  ? Liver cancer Maternal Grandfather   ?     dx 31s  ? Breast cancer Niece   ?     dx 30s  ? ? ?[place] is unaware of previous family history of genetic testing for hereditary cancer risks. There is no reported Ashkenazi Jewish ancestry. There is no known consanguinity. ? ?GENETIC COUNSELING ASSESSMENT: Ashley [place] is a 66 y.o. female with a personal and family history of cancer which is somewhat suggestive of a hereditary cancer syndrome and predisposition to cancer given the presence of related cancers in the family. We, therefore, discussed and recommended the following at today's visit.  ? ?DISCUSSION: We discussed that 5 - 10% of cancer is hereditary.  Most cases of hereditary breast cancer associated with mutations in BRCA1/2.  There are other genes that can be associated with hereditary breast cancer syndromes.  We discussed that testing is beneficial for several reasons including knowing how to follow individuals for their cancer risks, identifying whether potential treatment options would be beneficial, and understanding if other family members could be at risk for cancer and allowing them to undergo genetic testing.  ? ?We reviewed the characteristics, features and inheritance patterns of hereditary cancer syndromes. We also discussed genetic testing, including the appropriate family members to test, the process of testing, insurance coverage and turn-around-time for results. We discussed the implications of a negative, positive, carrier and/or variant of uncertain significant result. We recommended Ashley Robertson pursue genetic testing for a panel that includes genes associated with breast and prostate cancer.  ? ?Ashley [place]  was offered a common hereditary cancer panel (47 genes) and an expanded pan-cancer panel (77 genes). [place]  was informed of the benefits and limitations of each panel, including that expanded pan-cancer panels contain genes that do not have clear management guidelines at this point in time.  We also discussed that

## 2022-02-21 NOTE — Therapy (Signed)
?OUTPATIENT PHYSICAL THERAPY BREAST CANCER BASELINE EVALUATION ? ? ?Patient Name: Ashley Robertson ?MRN: [id number] ?DOB:[birth year], 66 y.o., female ?Today's Date: 02/21/2022 ? ? PT End of Session - 02/21/22 0944   ? ? Visit Number 1   ? Number of Visits 2   ? Date for PT Re-Evaluation 08/24/22   ? PT Start Time 1130   ? PT Stop Time 1154   ? PT Time Calculation (min) 24 min   ? Activity Tolerance Patient tolerated treatment well   ? Behavior During Therapy [facility] for tasks assessed/performed   ? ?  ?  ? ?  ? ? ?Past Medical History:  ?Diagnosis Date  ? Allergy   ? Lisinopril and lipitor  ? Anxiety and depression   ? Aortic atherosclerosis ([place]) 03/2020  ? CT Chest: 2 V (LAD & LCx) Coronary Atherosclerosis, Aortic Atherosclerosis (no aneurysm).  Mild centrilobular emphysema with mild diffuse bronchial thickening; several scattered small solitary pulmonary nodules (largest 5.6 mm in anterior left upper lobe)  ? Breast cancer ([place]) 12/2021  ? Right breast  ? Carotid artery plaque, bilateral 10/2015  ? Mild to moderate plaque L>R without significant stenosis  ? COPD (chronic obstructive pulmonary disease) ([place])   ? Coronary Artery Calcification - Score 79   ? Coronary Calcium Score 79.  LAD and circumflex calcification noted.  Normal ascending aorta with mild calcification.  ? Current every day smoker   ? Emphysema lung ([place])   ? Noted on chest CT  ? Hyperlipidemia   ? Hypertension   ? Controlled with amlodipine  ? Prediabetes   ? Skin cancer 2021  ? Remove 2022  ? ?Past Surgical History:  ?Procedure Laterality Date  ? [place]  ? [place]  ? Rhinoplasty  ? RHINOPLASTY  1981  ? TONSILLECTOMY  1978  ? TRANSTHORACIC ECHOCARDIOGRAM  10/2016  ? EF 55 to 60%.  Normal systolic and diastolic function.  No ASD/PFO  ? TUBAL LIGATION  1991  ? ?Patient Active Problem List  ? Diagnosis Date Noted  ? Malignant neoplasm of overlapping sites of right breast in female, estrogen receptor positive ([place])  02/16/2022  ? Bilateral carotid bruits 04/26/2020  ? Essential hypertension 03/19/2020  ? Hyperlipidemia with target LDL less than 100 03/19/2020  ? Prediabetes 03/19/2020  ? Current every day smoker 03/19/2020  ? Agatston CAC score, <100 03/15/2020  ? ? ?PCP: Rankins, Bill Salinas, MD ? ?REFERRING PROVIDER: Rolm Bookbinder, MD ? ?REFERRING DIAG: Right breast cancer ? ?THERAPY DIAG:  ?Malignant neoplasm of upper-outer quadrant of right breast in female, estrogen receptor positive ([place]) ? ?Abnormal posture ? ?ONSET DATE: 02/08/2022 ? ?SUBJECTIVE                                                                                                                                                                                          ? ?  SUBJECTIVE STATEMENT: ?Patient reports she is here today to be seen by her medical team for her newly diagnosed right breast cancer.  ? ?PERTINENT HISTORY:  ?Patient was diagnosed on 02/08/2022 with right grade II invasive lobular carcinoma breast cancer. It measures 2.6 cm and 3.3 cm (2 masses) and is located in the upper outer quadrant. It is ER positive, PR negative, and HER2 positive with a Ki67 of 10%. Patient has COPD and hypertension. ? ?PATIENT GOALS   reduce lymphedema risk and learn post op HEP.  ? ?PAIN:  ?Are you having pain? No ? ? ?PRECAUTIONS: Active CA ? ?HAND DOMINANCE: right ? ?WEIGHT BEARING RESTRICTIONS No ? ?FALLS:  ?Has patient fallen in last 6 months? No, Number of falls: 0 ? ?LIVING ENVIRONMENT: ?Patient lives with: husband ?Lives in: House/apartment ?Has following equipment at home: None ? ?OCCUPATION: Retired Therapist, sports ? ?LEISURE: She does not exercise ? ?PRIOR LEVEL OF FUNCTION: Independent ? ? ?OBJECTIVE ? ?COGNITION: ? Overall cognitive status: Within functional limits for tasks assessed   ? ?POSTURE:  ?Forward head and rounded shoulders posture ? ?UPPER EXTREMITY AROM/PROM: ? ?A/PROM RIGHT  02/21/2022 ?  ?Shoulder extension 52  ?Shoulder flexion 154  ?Shoulder abduction  168  ?Shoulder internal rotation 79  ?Shoulder external rotation 80  ?  (Blank rows = not tested) ? ?A/PROM LEFT  02/21/2022  ?Shoulder extension 47  ?Shoulder flexion 135  ?Shoulder abduction 169  ?Shoulder internal rotation 73  ?Shoulder external rotation 82  ?  (Blank rows = not tested) ? ? ?CERVICAL AROM: ?All within normal limits:  ? ? Percent limited  ?Flexion 25% limited  ?Extension WNL  ?Right lateral flexion 25% limited  ?Left lateral flexion 25% limited  ?Right rotation 25% limited  ?Left rotation 25% limited  ?  ? ? ?UPPER EXTREMITY STRENGTH: WNL ? ? ?LYMPHEDEMA ASSESSMENTS:  ? ?[place] RIGHT  02/21/2022  ?10 cm proximal to olecranon process 24.5  ?Olecranon process 23.2  ?10 cm proximal to ulnar styloid process 19.6  ?Just proximal to ulnar styloid process 15.3  ?Across hand at thumb web space 19.2  ?At base of 2nd digit 7  ?(Blank rows = not tested) ? ?[place] LEFT  02/21/2022  ?10 cm proximal to olecranon process 23.3  ?Olecranon process 22.5  ?10 cm proximal to ulnar styloid process 18.9  ?Just proximal to ulnar styloid process 14.9  ?Across hand at thumb web space 19  ?At base of 2nd digit 6.8  ?(Blank rows = not tested) ? ? ?L-DEX LYMPHEDEMA SCREENING: ? ?The patient was assessed using the L-Dex machine today to produce a lymphedema index baseline score. The patient will be reassessed on a regular basis (typically every 3 months) to obtain new L-Dex scores. If the score is > 6.5 points away from his/her baseline score indicating onset of subclinical lymphedema, it will be recommended to wear a compression garment for 4 weeks, 12 hours per day and then be reassessed. If the score continues to be > 6.5 points from baseline at reassessment, we will initiate lymphedema treatment. Assessing in this manner has a 95% rate of preventing clinically significant lymphedema. ? ? L-DEX FLOWSHEETS - 02/21/22 0900   ? ?  ? L-DEX LYMPHEDEMA SCREENING  ? Measurement Type Unilateral   ? L-DEX MEASUREMENT EXTREMITY  Upper Extremity   ? POSITION  Standing   ? DOMINANT SIDE Right   ? At Risk Side Right   ? BASELINE SCORE (UNILATERAL) -0.2   ? ?  ?  ? ?  ? ? ? ?  QUICK DASH SURVEY: ? Katina Dung - 02/21/22 0001   ? ? Open a tight or new jar No difficulty   ? Do heavy household chores (wash walls, wash floors) No difficulty   ? Carry a shopping bag or briefcase No difficulty   ? Wash your back No difficulty   ? Use a knife to cut food No difficulty   ? Recreational activities in which you take some force or impact through your arm, shoulder, or hand (golf, hammering, tennis) No difficulty   ? During the past week, to what extent has your arm, shoulder or hand problem interfered with your normal social activities with family, friends, neighbors, or groups? Not at all   ? During the past week, to what extent has your arm, shoulder or hand problem limited your work or other regular daily activities Not at all   ? Arm, shoulder, or hand pain. Mild   ? Tingling (pins and needles) in your arm, shoulder, or hand None   ? Difficulty Sleeping No difficulty   ? DASH Score 2.27 %   ? ?  ?  ? ?  ? ? ? ?PATIENT EDUCATION:  ?Education details: Lymphedema risk reduction and post op shoulder/posture HEP ?Person educated: Patient ?Education method: Explanation, Demonstration, Handout ?Education comprehension: Patient verbalized understanding and returned demonstration ? ? ?HOME EXERCISE PROGRAM: ?Patient was instructed today in a home exercise program today for post op shoulder range of motion. These included active assist shoulder flexion in sitting, scapular retraction, wall walking with shoulder abduction, and hands behind head external rotation.  She was encouraged to do these twice a day, holding 3 seconds and repeating 5 times when permitted by her physician. ? ? ?ASSESSMENT: ? ?CLINICAL IMPRESSION: ?Patient was diagnosed on 02/08/2022 with right grade II invasive lobular carcinoma breast cancer. It measures 2.6 cm and 3.3 cm (2 masses) and is  located in the upper outer quadrant. It is ER positive, PR negative, and HER2 positive with a Ki67 of 10%. Patient has COPD and hypertension.Her multidisciplinary medical team met prior to her assessments to deter

## 2022-02-21 NOTE — Research (Signed)
[id number] - TREATMENT OF REFRACTORY NAUSEA ? ?INTRO STUDY/CONSENT ? ?Patient Ashley Robertson was identified by MD Burr Medico as a potential candidate for the above listed study.  This Clinical Research Nurse met with [name], [id number], on 02/21/22 in a manner and location that ensures patient privacy to discuss participation in the above listed research study.  Patient is Accompanied by daughter [name] .  A copy of the informed consent document with embedded HIPAA language was provided to the patient.  Patient reads, speaks, and understands [place].   ?Patient was provided with the business card of this Nurse and encouraged to contact the research team with any questions.  Approximately 20 minutes were spent with the patient reviewing the informed consent documents.  Patient was provided the option of taking informed consent documents home to review and was encouraged to review at their convenience with their support network, including other care providers. Patient took the consent documents home to review.  Will f/u with patient on potential interest in the next few days. ? ?Wells Guiles 'Learta Codding' Pamula Luther, RN, BSN ?Clinical Research Nurse I ?02/21/22 ?1:05 PM ? ? ?

## 2022-02-21 NOTE — Progress Notes (Signed)
[facility] Clinical Social Work  ?Initial Assessment ? ? ?Ashley Robertson is a 66 y.o. year old female accompanied by daughter, Ashley Robertson. Clinical Social Work was referred by  [facility]  for assessment of psychosocial needs.  ? ?SDOH (Social Determinants of Health) assessments performed: Yes ?SDOH Interventions   ? ?Flowsheet Row Most Recent Value  ?SDOH Interventions   ?Food Insecurity Interventions Intervention Not Indicated  ?Financial Strain Interventions Intervention Not Indicated  ?Housing Interventions Intervention Not Indicated  ?Transportation Interventions Intervention Not Indicated  ? ?  ?  ?Distress Screen completed: Yes ? ?  02/21/2022  ? 11:30 AM  ?ONCBCN DISTRESS SCREENING  ?Screening Type Initial Screening  ?Distress experienced in past week (1-10) 4  ?Practical problem type Childcare  ?Information Concerns Type Lack of info about diagnosis;Lack of info about treatment  ? ? ? ? ?Family/Social Information:  ?Housing Arrangement: patient lives with husband . Daughter [place] Robertson) & 8yo grandchild live in [place], daughter [place] & newborn grandchild live in [place] ?Family members/support persons in your life? Family and Friends ?Transportation concerns: no  ?Employment: Retired Therapist, sports. Income source: retirement income ?Financial concerns: No ?Type of concern: None ?Food access concerns: no ?Religious or spiritual practice: yes, faith and prayer help during stressful times ?Services Currently in place:  mood medication (Prozac) ? ?Coping/ Adjustment to diagnosis: ?Patient understands treatment plan and what happens next? yes ?Concerns about diagnosis and/or treatment:  Adjusting to diagnosis ?Patient reported stressors:  childcare (she watches her grandkids and doesn't know how this will impact) ?Patient enjoys reading, time with family/ friends, and her dog ?Current coping skills/ strengths: Capable of independent living , Motivation for treatment/growth , Supportive family/friends , and Work skills  ? ? ? SUMMARY: ?Current SDOH  Barriers:  ?No significant SDOH concerns identified at this time ? ?Clinical Social Work Clinical Goal(s):  ?None at this time ? ?Interventions: ?Discussed common feeling and emotions when being diagnosed with cancer, and the importance of support during treatment ?Informed patient of the support team roles and support services at [facility] ?Provided CSW contact information and encouraged patient to call with any questions or concerns ? ? ?Follow Up Plan: Patient will contact CSW with any support or resource needs ?Patient verbalizes understanding of plan: Yes ? ? ? ?Rhylee Pucillo E Shaquill Iseman, LCSW ?

## 2022-02-21 NOTE — Progress Notes (Signed)
START ON PATHWAY REGIMEN - Breast ? ? ?  Cycle 1: A cycle is 21 days: ?    Pertuzumab  ?    Trastuzumab-xxxx  ?    Docetaxel  ?    Carboplatin  ?  Cycles 2 through 6: A cycle is every 21 days: ?    Pertuzumab  ?    Trastuzumab-xxxx  ?    Docetaxel  ?    Carboplatin  ? ?**Always confirm dose/schedule in your pharmacy ordering system** ? ?Patient Characteristics: ?Preoperative or Nonsurgical Candidate (Clinical Staging), Neoadjuvant Therapy followed by Surgery, Invasive Disease, Chemotherapy, HER2 Positive, ER Positive ?Therapeutic Status: Preoperative or Nonsurgical Candidate (Clinical Staging) ?AJCC M Category: cM0 ?AJCC Grade: G2 ?Breast Surgical Plan: Neoadjuvant Therapy followed by Surgery ?ER Status: Positive (+) ?AJCC 8 Stage Grouping: IIA ?HER2 Status: Positive (+) ?AJCC T Category: cT2 ?AJCC N Category: cN1 ?PR Status: Negative (-) ?Intent of Therapy: ?Curative Intent, Discussed with Patient ?

## 2022-02-21 NOTE — Research (Signed)
[employer] 2021-05 - Specimen Collection Study to Evaluate Biomarkers in Subjects with Cancer  ? ?INTRO STUDY/CONSENTS ? ?Patient Ashley [place] was identified by MD Burr Medico as a potential candidate for the above listed study.  This Clinical Research Nurse met with [name], [id number], on 02/21/22 in a manner and location that ensures patient privacy to discuss participation in the above listed research study.  Patient is Accompanied by daughter [name] .  A copy of the informed consent document with embedded HIPAA language was provided to the patient.  Patient reads, speaks, and understands [place].   ?Patient was provided with the business card of this Nurse and encouraged to contact the research team with any questions.  Approximately 20 minutes were spent with the patient reviewing the informed consent documents.  Patient was provided the option of taking informed consent documents home to review and was encouraged to review at their convenience with their support network, including other care providers. Patient took the consent documents home to review.  Will f/u with pt on potential interest in the next few days. ? ?Wells Guiles 'Learta Codding' Sylvia Kondracki, RN, BSN ?Clinical Research Nurse I ?02/21/22 ?1:06 PM ? ? ?

## 2022-02-22 ENCOUNTER — Telehealth: Payer: Self-pay | Admitting: Hematology

## 2022-02-22 NOTE — Telephone Encounter (Signed)
Spoke to patient to give update appointment information, will mail a copy of the calendar to her ?

## 2022-02-23 ENCOUNTER — Encounter (HOSPITAL_BASED_OUTPATIENT_CLINIC_OR_DEPARTMENT_OTHER): Payer: Self-pay | Admitting: General Surgery

## 2022-02-23 ENCOUNTER — Other Ambulatory Visit: Payer: Self-pay

## 2022-02-27 ENCOUNTER — Other Ambulatory Visit: Payer: Self-pay | Admitting: General Surgery

## 2022-02-27 ENCOUNTER — Telehealth: Payer: Self-pay | Admitting: Emergency Medicine

## 2022-02-27 NOTE — Telephone Encounter (Signed)
[id number] - TREATMENT OF REFRACTORY NAUSEA ? ?02/27/22 ? ?Called to follow up regarding participation in this study.  The patient states she had time to review the consent documents, and has decided to decline participation in this study. ? ?She was thanked for her time and consideration. ? ?Clabe Seal ?Clinical Research Coordinator I  ?02/27/22 10:54 AM ? ?

## 2022-02-27 NOTE — Telephone Encounter (Signed)
[employer] 2021-05 - Specimen Collection Study to Evaluate Biomarkers in Subjects with Cancer  ? ?02/27/22 ? ?Called to follow up regarding participation in this study.  The patient states she had time to review the consent documents, and has decided to decline participation in this study. ? ?She was thanked for her time and consideration. ? ?Clabe Seal ?Clinical Research Coordinator I  ?02/27/22  10:55 AM ? ?

## 2022-02-28 ENCOUNTER — Other Ambulatory Visit: Payer: Self-pay

## 2022-02-28 ENCOUNTER — Encounter (HOSPITAL_BASED_OUTPATIENT_CLINIC_OR_DEPARTMENT_OTHER)
Admission: RE | Admit: 2022-02-28 | Discharge: 2022-02-28 | Disposition: A | Discharge status: Home / Self Care | Payer: PPO | Source: Ambulatory Visit | Attending: General Surgery | Admitting: General Surgery

## 2022-02-28 DIAGNOSIS — Z01810 Encounter for preprocedural cardiovascular examination: Secondary | ICD-10-CM | POA: Insufficient documentation

## 2022-02-28 NOTE — Progress Notes (Signed)
Pharmacist Chemotherapy Monitoring - Initial Assessment   ? ?Anticipated start date: 03/07/22  ? ?The following has been reviewed per standard work regarding the patient's treatment regimen: ?The patient's diagnosis, treatment plan and drug doses, and organ/hematologic function ?Lab orders and baseline tests specific to treatment regimen  ?The treatment plan start date, drug sequencing, and pre-medications ?Prior authorization status  ?Patient's documented medication list, including drug-drug interaction screen and prescriptions for anti-emetics and supportive care specific to the treatment regimen ?The drug concentrations, fluid compatibility, administration routes, and timing of the medications to be used ?The patient's access for treatment and lifetime cumulative dose history, if applicable  ?The patient's medication allergies and previous infusion related reactions, if applicable  ? ?Changes made to treatment plan:  ?N/A ? ?Follow up needed:  ? ?ECHO on order ? ? ?Wynona Neat, [facility], ?02/28/2022  1:16 PM ? ?

## 2022-02-28 NOTE — Progress Notes (Signed)
Patient was provided with CHG cleanser to use at home before the procedure. Patient verbalized understanding of instructions.       Patient Instructions  The night before surgery:  No food after midnight. ONLY clear liquids after midnight  The day of surgery (if you do NOT have diabetes):  Drink ONE (1) Pre-Surgery Clear Ensure as directed.   This drink was given to you during your hospital  pre-op appointment visit. The pre-op nurse will instruct you on the time to drink the  Pre-Surgery Ensure depending on your surgery time. Finish the drink at the designated time by the pre-op nurse.  Nothing else to drink after completing the  Pre-Surgery Clear Ensure.  The day of surgery (if you have diabetes): Drink ONE (1) Gatorade 2 (G2) as directed. This drink was given to you during your hospital  pre-op appointment visit.  The pre-op nurse will instruct you on the time to drink the   Gatorade 2 (G2) depending on your surgery time. Color of the Gatorade may vary. Red is not allowed. Nothing else to drink after completing the  Gatorade 2 (G2).         If you have questions, please contact your surgeon's office.  

## 2022-03-01 ENCOUNTER — Ambulatory Visit (HOSPITAL_COMMUNITY)
Admission: RE | Admit: 2022-03-01 | Discharge: 2022-03-01 | Disposition: A | Discharge status: Home / Self Care | Payer: PPO | Source: Ambulatory Visit | Attending: Hematology | Admitting: Hematology

## 2022-03-01 ENCOUNTER — Telehealth: Payer: Self-pay | Admitting: *Deleted

## 2022-03-01 ENCOUNTER — Encounter: Payer: Self-pay | Admitting: *Deleted

## 2022-03-01 DIAGNOSIS — Z170 Estrogen receptor positive status [ER+]: Secondary | ICD-10-CM | POA: Diagnosis not present

## 2022-03-01 DIAGNOSIS — C50811 Malignant neoplasm of overlapping sites of right female breast: Secondary | ICD-10-CM | POA: Insufficient documentation

## 2022-03-01 DIAGNOSIS — C50911 Malignant neoplasm of unspecified site of right female breast: Secondary | ICD-10-CM | POA: Diagnosis not present

## 2022-03-01 MED ORDER — GADOBUTROL 1 MMOL/ML IV SOLN
7.0000 mL | Freq: Once | INTRAVENOUS | Status: AC | PRN
  Administered 2022-03-01: 7 mL via INTRAVENOUS

## 2022-03-01 NOTE — Telephone Encounter (Signed)
Spoke with patient to follow up from [facility] 3/22 and assess navigation needs. Patient denies any questions or concerns at this time.  Reviewed appts. Encouraged her to call should anything arise.  Patient verbalized understanding.  ?

## 2022-03-02 ENCOUNTER — Encounter: Payer: Self-pay | Admitting: *Deleted

## 2022-03-05 ENCOUNTER — Encounter (HOSPITAL_COMMUNITY)
Admission: RE | Admit: 2022-03-05 | Discharge: 2022-03-05 | Disposition: A | Discharge status: Home / Self Care | Payer: PPO | Source: Ambulatory Visit | Attending: Hematology | Admitting: Hematology

## 2022-03-05 ENCOUNTER — Ambulatory Visit (HOSPITAL_BASED_OUTPATIENT_CLINIC_OR_DEPARTMENT_OTHER)
Admission: RE | Admit: 2022-03-05 | Discharge: 2022-03-05 | Disposition: A | Discharge status: Home / Self Care | Payer: PPO | Source: Ambulatory Visit | Attending: Hematology | Admitting: Hematology

## 2022-03-05 ENCOUNTER — Ambulatory Visit (HOSPITAL_COMMUNITY)
Admission: RE | Admit: 2022-03-05 | Discharge: 2022-03-05 | Disposition: A | Discharge status: Home / Self Care | Payer: PPO | Source: Ambulatory Visit | Attending: Hematology | Admitting: Hematology

## 2022-03-05 ENCOUNTER — Inpatient Hospital Stay: Payer: PPO | Attending: Hematology

## 2022-03-05 DIAGNOSIS — Z170 Estrogen receptor positive status [ER+]: Secondary | ICD-10-CM | POA: Diagnosis not present

## 2022-03-05 DIAGNOSIS — I7143 Infrarenal abdominal aortic aneurysm, without rupture: Secondary | ICD-10-CM | POA: Diagnosis not present

## 2022-03-05 DIAGNOSIS — C50811 Malignant neoplasm of overlapping sites of right female breast: Secondary | ICD-10-CM

## 2022-03-05 DIAGNOSIS — E785 Hyperlipidemia, unspecified: Secondary | ICD-10-CM | POA: Diagnosis not present

## 2022-03-05 DIAGNOSIS — Z7952 Long term (current) use of systemic steroids: Secondary | ICD-10-CM | POA: Insufficient documentation

## 2022-03-05 DIAGNOSIS — Z7982 Long term (current) use of aspirin: Secondary | ICD-10-CM | POA: Insufficient documentation

## 2022-03-05 DIAGNOSIS — M8580 Other specified disorders of bone density and structure, unspecified site: Secondary | ICD-10-CM | POA: Insufficient documentation

## 2022-03-05 DIAGNOSIS — Z85828 Personal history of other malignant neoplasm of skin: Secondary | ICD-10-CM | POA: Insufficient documentation

## 2022-03-05 DIAGNOSIS — H02401 Unspecified ptosis of right eyelid: Secondary | ICD-10-CM | POA: Diagnosis not present

## 2022-03-05 DIAGNOSIS — Z5112 Encounter for antineoplastic immunotherapy: Secondary | ICD-10-CM | POA: Insufficient documentation

## 2022-03-05 DIAGNOSIS — Z79899 Other long term (current) drug therapy: Secondary | ICD-10-CM | POA: Insufficient documentation

## 2022-03-05 DIAGNOSIS — Z0189 Encounter for other specified special examinations: Secondary | ICD-10-CM

## 2022-03-05 DIAGNOSIS — J449 Chronic obstructive pulmonary disease, unspecified: Secondary | ICD-10-CM | POA: Diagnosis not present

## 2022-03-05 DIAGNOSIS — Z01818 Encounter for other preprocedural examination: Secondary | ICD-10-CM | POA: Diagnosis not present

## 2022-03-05 DIAGNOSIS — F419 Anxiety disorder, unspecified: Secondary | ICD-10-CM | POA: Insufficient documentation

## 2022-03-05 DIAGNOSIS — C773 Secondary and unspecified malignant neoplasm of axilla and upper limb lymph nodes: Secondary | ICD-10-CM | POA: Insufficient documentation

## 2022-03-05 DIAGNOSIS — C50111 Malignant neoplasm of central portion of right female breast: Secondary | ICD-10-CM | POA: Diagnosis not present

## 2022-03-05 DIAGNOSIS — Z87891 Personal history of nicotine dependence: Secondary | ICD-10-CM | POA: Insufficient documentation

## 2022-03-05 DIAGNOSIS — J439 Emphysema, unspecified: Secondary | ICD-10-CM | POA: Diagnosis not present

## 2022-03-05 DIAGNOSIS — R918 Other nonspecific abnormal finding of lung field: Secondary | ICD-10-CM | POA: Diagnosis not present

## 2022-03-05 DIAGNOSIS — C50919 Malignant neoplasm of unspecified site of unspecified female breast: Secondary | ICD-10-CM | POA: Diagnosis not present

## 2022-03-05 DIAGNOSIS — N2883 Nephroptosis: Secondary | ICD-10-CM | POA: Diagnosis not present

## 2022-03-05 DIAGNOSIS — R7303 Prediabetes: Secondary | ICD-10-CM | POA: Insufficient documentation

## 2022-03-05 DIAGNOSIS — Z5111 Encounter for antineoplastic chemotherapy: Secondary | ICD-10-CM | POA: Insufficient documentation

## 2022-03-05 DIAGNOSIS — Z5189 Encounter for other specified aftercare: Secondary | ICD-10-CM | POA: Insufficient documentation

## 2022-03-05 DIAGNOSIS — I10 Essential (primary) hypertension: Secondary | ICD-10-CM | POA: Insufficient documentation

## 2022-03-05 LAB — ECHOCARDIOGRAM COMPLETE
AR max vel: 1.93 cm2
AV Area VTI: 2.03 cm2
AV Area mean vel: 1.95 cm2
AV Mean grad: 6 mmHg
AV Peak grad: 11 mmHg
Ao pk vel: 1.66 m/s
Area-P 1/2: 3.27 cm2
S' Lateral: 3.7 cm

## 2022-03-05 MED ORDER — SODIUM CHLORIDE (PF) 0.9 % IJ SOLN
INTRAMUSCULAR | Status: AC
  Filled 2022-03-05: qty 50

## 2022-03-05 MED ORDER — IOHEXOL 300 MG/ML  SOLN
100.0000 mL | Freq: Once | INTRAMUSCULAR | Status: AC | PRN
  Administered 2022-03-05: 100 mL via INTRAVENOUS

## 2022-03-05 MED ORDER — TECHNETIUM TC 99M MEDRONATE IV KIT
20.0000 | PACK | Freq: Once | INTRAVENOUS | Status: AC | PRN
  Administered 2022-03-05: 21 via INTRAVENOUS

## 2022-03-05 NOTE — Progress Notes (Signed)
Echocardiogram ?2D Echocardiogram has been performed. ? ?Ashley Robertson ?03/05/2022, 9:47 AM ?

## 2022-03-06 ENCOUNTER — Other Ambulatory Visit (HOSPITAL_COMMUNITY): Payer: Self-pay

## 2022-03-06 ENCOUNTER — Ambulatory Visit (HOSPITAL_BASED_OUTPATIENT_CLINIC_OR_DEPARTMENT_OTHER): Payer: PPO | Admitting: Anesthesiology

## 2022-03-06 ENCOUNTER — Other Ambulatory Visit (HOSPITAL_COMMUNITY): Payer: PPO

## 2022-03-06 ENCOUNTER — Ambulatory Visit (HOSPITAL_COMMUNITY): Payer: PPO

## 2022-03-06 ENCOUNTER — Other Ambulatory Visit: Payer: Self-pay

## 2022-03-06 ENCOUNTER — Encounter (HOSPITAL_BASED_OUTPATIENT_CLINIC_OR_DEPARTMENT_OTHER)
Admission: RE | Disposition: A | Discharge status: Home / Self Care | Payer: Self-pay | Source: Home / Self Care | Attending: General Surgery

## 2022-03-06 ENCOUNTER — Other Ambulatory Visit: Payer: PPO

## 2022-03-06 ENCOUNTER — Encounter (HOSPITAL_BASED_OUTPATIENT_CLINIC_OR_DEPARTMENT_OTHER): Payer: Self-pay | Admitting: General Surgery

## 2022-03-06 ENCOUNTER — Encounter: Payer: Self-pay | Admitting: *Deleted

## 2022-03-06 ENCOUNTER — Telehealth: Payer: Self-pay | Admitting: *Deleted

## 2022-03-06 ENCOUNTER — Ambulatory Visit (HOSPITAL_BASED_OUTPATIENT_CLINIC_OR_DEPARTMENT_OTHER)
Admission: RE | Admit: 2022-03-06 | Discharge: 2022-03-06 | Disposition: A | Discharge status: Home / Self Care | Payer: PPO | Attending: General Surgery | Admitting: General Surgery

## 2022-03-06 DIAGNOSIS — Z803 Family history of malignant neoplasm of breast: Secondary | ICD-10-CM | POA: Insufficient documentation

## 2022-03-06 DIAGNOSIS — C50811 Malignant neoplasm of overlapping sites of right female breast: Secondary | ICD-10-CM | POA: Insufficient documentation

## 2022-03-06 DIAGNOSIS — F17210 Nicotine dependence, cigarettes, uncomplicated: Secondary | ICD-10-CM | POA: Diagnosis not present

## 2022-03-06 DIAGNOSIS — I110 Hypertensive heart disease with heart failure: Secondary | ICD-10-CM

## 2022-03-06 DIAGNOSIS — I2510 Atherosclerotic heart disease of native coronary artery without angina pectoris: Secondary | ICD-10-CM | POA: Diagnosis not present

## 2022-03-06 DIAGNOSIS — J449 Chronic obstructive pulmonary disease, unspecified: Secondary | ICD-10-CM | POA: Diagnosis not present

## 2022-03-06 DIAGNOSIS — C50911 Malignant neoplasm of unspecified site of right female breast: Secondary | ICD-10-CM | POA: Diagnosis not present

## 2022-03-06 DIAGNOSIS — F419 Anxiety disorder, unspecified: Secondary | ICD-10-CM | POA: Insufficient documentation

## 2022-03-06 DIAGNOSIS — I10 Essential (primary) hypertension: Secondary | ICD-10-CM | POA: Insufficient documentation

## 2022-03-06 DIAGNOSIS — Z170 Estrogen receptor positive status [ER+]: Secondary | ICD-10-CM | POA: Insufficient documentation

## 2022-03-06 HISTORY — PX: PORTACATH PLACEMENT: SHX2246

## 2022-03-06 SURGERY — INSERTION, TUNNELED CENTRAL VENOUS DEVICE, WITH PORT
Anesthesia: General | Site: Chest | Laterality: Left

## 2022-03-06 MED ORDER — MIDAZOLAM HCL 2 MG/2ML IJ SOLN
INTRAMUSCULAR | Status: AC
  Filled 2022-03-06: qty 2

## 2022-03-06 MED ORDER — CEFAZOLIN SODIUM-DEXTROSE 2-4 GM/100ML-% IV SOLN
INTRAVENOUS | Status: AC
  Filled 2022-03-06: qty 100

## 2022-03-06 MED ORDER — PROPOFOL 10 MG/ML IV BOLUS
INTRAVENOUS | Status: AC
  Filled 2022-03-06: qty 20

## 2022-03-06 MED ORDER — EPHEDRINE 5 MG/ML INJ
INTRAVENOUS | Status: AC
  Filled 2022-03-06: qty 5

## 2022-03-06 MED ORDER — FENTANYL CITRATE (PF) 100 MCG/2ML IJ SOLN
INTRAMUSCULAR | Status: AC
  Filled 2022-03-06: qty 2

## 2022-03-06 MED ORDER — ONDANSETRON HCL 4 MG/2ML IJ SOLN
INTRAMUSCULAR | Status: AC
  Filled 2022-03-06: qty 2

## 2022-03-06 MED ORDER — ACETAMINOPHEN 500 MG PO TABS
ORAL_TABLET | ORAL | Status: AC
  Filled 2022-03-06: qty 2

## 2022-03-06 MED ORDER — ENSURE PRE-SURGERY PO LIQD: 296.0000 mL | Freq: Once | ORAL | Status: DC

## 2022-03-06 MED ORDER — ACETAMINOPHEN 500 MG PO TABS
1000.0000 mg | ORAL_TABLET | ORAL | Status: AC
Start: 2022-03-06 — End: 2022-03-06
  Administered 2022-03-06: 1000 mg via ORAL

## 2022-03-06 MED ORDER — LIDOCAINE 2% (20 MG/ML) 5 ML SYRINGE
INTRAMUSCULAR | Status: AC
  Filled 2022-03-06: qty 5

## 2022-03-06 MED ORDER — HEPARIN SOD (PORK) LOCK FLUSH 100 UNIT/ML IV SOLN
INTRAVENOUS | Status: DC | PRN
  Administered 2022-03-06: 500 [IU] via INTRAVENOUS

## 2022-03-06 MED ORDER — FENTANYL CITRATE (PF) 100 MCG/2ML IJ SOLN: 25.0000 ug | INTRAMUSCULAR | Status: DC | PRN

## 2022-03-06 MED ORDER — TRAMADOL HCL 50 MG PO TABS
50.0000 mg | ORAL_TABLET | Freq: Four times a day (QID) | ORAL | 0 refills | Status: DC | PRN
Start: 2022-03-06 — End: 2022-07-16
  Filled 2022-03-06: qty 10, 3d supply, fill #0

## 2022-03-06 MED ORDER — CHLORHEXIDINE GLUCONATE CLOTH 2 % EX PADS: 6.0000 | MEDICATED_PAD | Freq: Once | CUTANEOUS | Status: DC

## 2022-03-06 MED ORDER — LACTATED RINGERS IV SOLN: INTRAVENOUS | Status: DC

## 2022-03-06 MED ORDER — PROPOFOL 10 MG/ML IV BOLUS
INTRAVENOUS | Status: DC | PRN
  Administered 2022-03-06: 160 mg via INTRAVENOUS

## 2022-03-06 MED ORDER — DEXAMETHASONE SODIUM PHOSPHATE 10 MG/ML IJ SOLN
INTRAMUSCULAR | Status: DC | PRN
  Administered 2022-03-06: 5 mg via INTRAVENOUS

## 2022-03-06 MED ORDER — ONDANSETRON HCL 4 MG/2ML IJ SOLN
INTRAMUSCULAR | Status: DC | PRN
  Administered 2022-03-06: 4 mg via INTRAVENOUS

## 2022-03-06 MED ORDER — FENTANYL CITRATE (PF) 100 MCG/2ML IJ SOLN
INTRAMUSCULAR | Status: DC | PRN
Start: 2022-03-06 — End: 2022-03-06
  Administered 2022-03-06: 25 ug via INTRAVENOUS
  Administered 2022-03-06: 50 ug via INTRAVENOUS
  Administered 2022-03-06: 25 ug via INTRAVENOUS

## 2022-03-06 MED ORDER — HEPARIN (PORCINE) IN NACL 2-0.9 UNITS/ML
INTRAMUSCULAR | Status: AC | PRN
  Administered 2022-03-06: 1 via INTRAVENOUS

## 2022-03-06 MED ORDER — EPHEDRINE SULFATE (PRESSORS) 50 MG/ML IJ SOLN
INTRAMUSCULAR | Status: DC | PRN
  Administered 2022-03-06: 5 mg via INTRAVENOUS

## 2022-03-06 MED ORDER — MIDAZOLAM HCL 5 MG/5ML IJ SOLN
INTRAMUSCULAR | Status: DC | PRN
  Administered 2022-03-06: 2 mg via INTRAVENOUS

## 2022-03-06 MED ORDER — BUPIVACAINE HCL (PF) 0.25 % IJ SOLN
INTRAMUSCULAR | Status: DC | PRN
  Administered 2022-03-06: 8 mL

## 2022-03-06 MED ORDER — CEFAZOLIN SODIUM-DEXTROSE 2-4 GM/100ML-% IV SOLN
2.0000 g | INTRAVENOUS | Status: AC
Start: 2022-03-06 — End: 2022-03-06
  Administered 2022-03-06: 2 g via INTRAVENOUS

## 2022-03-06 MED ORDER — LIDOCAINE HCL (CARDIAC) PF 100 MG/5ML IV SOSY
PREFILLED_SYRINGE | INTRAVENOUS | Status: DC | PRN
Start: 2022-03-06 — End: 2022-03-06
  Administered 2022-03-06: 60 mg via INTRATRACHEAL

## 2022-03-06 SURGICAL SUPPLY — 53 items
ADH SKN CLS APL DERMABOND .7 (GAUZE/BANDAGES/DRESSINGS) ×1
APL PRP STRL LF DISP 70% ISPRP (MISCELLANEOUS) ×1
APL SKNCLS STERI-STRIP NONHPOA (GAUZE/BANDAGES/DRESSINGS)
BAG DECANTER FOR FLEXI CONT (MISCELLANEOUS) ×2 IMPLANT
BENZOIN TINCTURE PRP APPL 2/3 (GAUZE/BANDAGES/DRESSINGS) ×1 IMPLANT
BLADE SURG 11 STRL SS (BLADE) ×2 IMPLANT
BLADE SURG 15 STRL LF DISP TIS (BLADE) ×1 IMPLANT
BLADE SURG 15 STRL SS (BLADE) ×2
CANISTER SUCT 1200ML W/VALVE (MISCELLANEOUS) IMPLANT
CHLORAPREP W/TINT 26 (MISCELLANEOUS) ×2 IMPLANT
COVER BACK TABLE 60X90IN (DRAPES) ×2 IMPLANT
COVER MAYO STAND STRL (DRAPES) ×2 IMPLANT
COVER PROBE 5X48 (MISCELLANEOUS) ×2
DERMABOND ADVANCED (GAUZE/BANDAGES/DRESSINGS) ×1
DERMABOND ADVANCED .7 DNX12 (GAUZE/BANDAGES/DRESSINGS) ×1 IMPLANT
DRAPE C-ARM 42X72 X-RAY (DRAPES) ×2 IMPLANT
DRAPE LAPAROSCOPIC ABDOMINAL (DRAPES) ×2 IMPLANT
DRAPE UTILITY XL STRL (DRAPES) ×2 IMPLANT
DRSG TEGADERM 4X4.75 (GAUZE/BANDAGES/DRESSINGS) ×2 IMPLANT
ELECT COATED BLADE 2.86 ST (ELECTRODE) ×2 IMPLANT
ELECT REM PT RETURN 9FT ADLT (ELECTROSURGICAL) ×2
ELECTRODE REM PT RTRN 9FT ADLT (ELECTROSURGICAL) ×1 IMPLANT
GAUZE 4X4 16PLY ~~LOC~~+RFID DBL (SPONGE) ×1 IMPLANT
GAUZE SPONGE 4X4 12PLY STRL LF (GAUZE/BANDAGES/DRESSINGS) ×2 IMPLANT
GLOVE SURG ENC MOIS LTX SZ7 (GLOVE) ×2 IMPLANT
GLOVE SURG POLYISO LF SZ6.5 (GLOVE) ×2 IMPLANT
GLOVE SURG UNDER POLY LF SZ6.5 (GLOVE) ×3 IMPLANT
GLOVE SURG UNDER POLY LF SZ7.5 (GLOVE) ×2 IMPLANT
GOWN STRL REUS W/ TWL LRG LVL3 (GOWN DISPOSABLE) ×2 IMPLANT
GOWN STRL REUS W/TWL LRG LVL3 (GOWN DISPOSABLE) ×6
IV KIT MINILOC 20X1 SAFETY (NEEDLE) IMPLANT
KIT CVR 48X5XPRB PLUP LF (MISCELLANEOUS) IMPLANT
KIT PORT POWER 8FR ISP CVUE (Port) ×1 IMPLANT
NDL HYPO 25X1 1.5 SAFETY (NEEDLE) ×1 IMPLANT
NDL SAFETY ECLIPSE 18X1.5 (NEEDLE) IMPLANT
NEEDLE HYPO 18GX1.5 SHARP (NEEDLE)
NEEDLE HYPO 25X1 1.5 SAFETY (NEEDLE) ×2 IMPLANT
PACK BASIN DAY SURGERY FS (CUSTOM PROCEDURE TRAY) ×2 IMPLANT
PENCIL SMOKE EVACUATOR (MISCELLANEOUS) ×2 IMPLANT
SLEEVE SCD COMPRESS KNEE MED (STOCKING) ×2 IMPLANT
SPIKE FLUID TRANSFER (MISCELLANEOUS) IMPLANT
STRIP CLOSURE SKIN 1/2X4 (GAUZE/BANDAGES/DRESSINGS) ×2 IMPLANT
SUT MNCRL AB 4-0 PS2 18 (SUTURE) ×2 IMPLANT
SUT PROLENE 2 0 SH DA (SUTURE) ×2 IMPLANT
SUT SILK 2 0 TIES 17X18 (SUTURE)
SUT SILK 2-0 18XBRD TIE BLK (SUTURE) IMPLANT
SUT VIC AB 3-0 SH 27 (SUTURE) ×2
SUT VIC AB 3-0 SH 27X BRD (SUTURE) ×1 IMPLANT
SYR 5ML LUER SLIP (SYRINGE) ×2 IMPLANT
SYR CONTROL 10ML LL (SYRINGE) ×2 IMPLANT
TOWEL GREEN STERILE FF (TOWEL DISPOSABLE) ×2 IMPLANT
TUBE CONNECTING 20X1/4 (TUBING) IMPLANT
YANKAUER SUCT BULB TIP NO VENT (SUCTIONS) IMPLANT

## 2022-03-06 NOTE — Anesthesia Postprocedure Evaluation (Signed)
Anesthesia Post Note ? ?Patient: JERA HEADINGS ? ?Procedure(s) Performed: INSERTION PORT-A-CATH (Left: Chest) ? ?  ? ?Patient location during evaluation: PACU ?Anesthesia Type: General ?Level of consciousness: awake and alert ?Pain management: pain level controlled ?Vital Signs Assessment: post-procedure vital signs reviewed and stable ?Respiratory status: spontaneous breathing, nonlabored ventilation, respiratory function stable and patient connected to nasal cannula oxygen ?Cardiovascular status: blood pressure returned to baseline and stable ?Postop Assessment: no apparent nausea or vomiting ?Anesthetic complications: no ? ? ?No notable events documented. ? ?Last Vitals:  ?Vitals:  ? 03/06/22 1315 03/06/22 1328  ?BP: (!) 148/77 (!) 150/77  ?Pulse: (!) 54 (!) 54  ?Resp: 13 15  ?Temp:  36.7 ?C  ?SpO2: 95% 94%  ?  ?Last Pain:  ?Vitals:  ? 03/06/22 1328  ?TempSrc:   ?PainSc: 0-No pain  ? ? ?  ?  ?  ?  ?  ?  ? ?Deysy Schabel L Marciel Offenberger ? ? ? ? ?

## 2022-03-06 NOTE — Anesthesia Procedure Notes (Signed)
Procedure Name: LMA Insertion ?Date/Time: 03/06/2022 12:14 PM ?Performed by: Glory Buff, CRNA ?Pre-anesthesia Checklist: Patient identified, Emergency Drugs available, Suction available and Patient being monitored ?Patient Re-evaluated:Patient Re-evaluated prior to induction ?Oxygen Delivery Method: Circle system utilized ?Preoxygenation: Pre-oxygenation with 100% oxygen ?Induction Type: IV induction ?LMA: LMA inserted ?LMA Size: 4.0 ?Number of attempts: 1 ?Placement Confirmation: positive ETCO2 ?Tube secured with: Tape ?Dental Injury: Teeth and Oropharynx as per pre-operative assessment  ? ? ? ? ?

## 2022-03-06 NOTE — Discharge Instructions (Addendum)
? ? ?PORT-A-CATH: POST OP INSTRUCTIONS ? ?Always review your discharge instruction sheet given to you by the facility where your surgery was performed.  ? ?A prescription for pain medication may be given to you upon discharge. Take your pain medication as prescribed, if needed. If narcotic pain medicine is not needed, then you make take acetaminophen (Tylenol) or ibuprofen (Advil) as needed.  ?Take your usually prescribed medications unless otherwise directed. ?If you need a refill on your pain medication, please contact our office. All narcotic pain medicine now requires a paper prescription.  Phoned in and fax refills are no longer allowed by law.  Prescriptions will not be filled after 5 pm or on weekends.  ?You should follow a light diet for the remainder of the day after your procedure. ?Most patients will experience some mild swelling and/or bruising in the area of the incision. It may take several days to resolve. ?It is common to experience some constipation if taking pain medication after surgery. Increasing fluid intake and taking a stool softener (such as Colace) will usually help or prevent this problem from occurring. A mild laxative (Milk of Magnesia or Miralax) should be taken according to package directions if there are no bowel movements after 48 hours.  ?Unless discharge instructions indicate otherwise, you may remove your bandages 48 hours after surgery, and you may shower at that time. You may have steri-strips (small white skin tapes) in place directly over the incision.  These strips should be left on the skin for 7-10 days.  If your surgeon used Dermabond (skin glue) on the incision, you may shower in 24 hours.  The glue will flake off over the next 2-3 weeks.  ?If your port is left accessed at the end of surgery (needle left in port), the dressing cannot get wet and should only by changed by a healthcare professional. When the port is no longer accessed (when the needle has been removed),  follow step 7.   ?ACTIVITIES:  Limit activity involving your arms for the next 72 hours. Do no strenuous exercise or activity for 1 week. You may drive when you are no longer taking prescription pain medication, you can comfortably wear a seatbelt, and you can maneuver your car. ?10.You may need to see your doctor in the office for a follow-up appointment.  Please ?      check with your doctor.  ?11.When you receive a new Port-a-Cath, you will get a product guide and  ?      ID card.  Please keep them in case you need them. ? ?WHEN TO [facility] [phone]): ?Fever over 101.0 ?Chills ?Continued bleeding from incision ?Increased redness and tenderness at the site ?Shortness of breath, difficulty breathing ? ? ?The clinic staff is available to answer your questions during regular business hours. Please don?t hesitate to call and ask to speak to one of the nurses or medical assistants for clinical concerns. If you have a medical emergency, go to the nearest emergency room or call 911.  A surgeon from [facility] is always on call at the hospital.  ? ? ? ?For further information, please visit [email or web address] ? ? ?May take Tylenol after 4pm, if needed.  ? ? ?Post Anesthesia Home Care Instructions ? ?Activity: ?Get plenty of rest for the remainder of the day. A responsible individual must stay with you for 24 hours following the procedure.  ?For the next 24 hours, DO NOT: ?-Drive a car ?-Paediatric nurse ?-Drink alcoholic  beverages ?-Take any medication unless instructed by your physician ?-Make any legal decisions or sign important papers. ? ?Meals: ?Start with liquid foods such as gelatin or soup. Progress to regular foods as tolerated. Avoid greasy, spicy, heavy foods. If nausea and/or vomiting occur, drink only clear liquids until the nausea and/or vomiting subsides. Call your physician if vomiting continues. ? ?Special Instructions/Symptoms: ?Your throat may feel dry or sore from  the anesthesia or the breathing tube placed in your throat during surgery. If this causes discomfort, gargle with warm salt water. The discomfort should disappear within 24 hours. ? ?If you had a scopolamine patch placed behind your ear for the management of post- operative nausea and/or vomiting: ? ?1. The medication in the patch is effective for 72 hours, after which it should be removed.  Wrap patch in a tissue and discard in the trash. Wash hands thoroughly with soap and water. ?2. You may remove the patch earlier than 72 hours if you experience unpleasant side effects which may include dry mouth, dizziness or visual disturbances. ?3. Avoid touching the patch. Wash your hands with soap and water after contact with the patch. ?    ? ? ? ?

## 2022-03-06 NOTE — Transfer of Care (Signed)
Immediate Anesthesia Transfer of Care Note ? ?Patient: Ashley Robertson ? ?Procedure(s) Performed: INSERTION PORT-A-CATH (Left: Chest) ? ?Patient Location: PACU ? ?Anesthesia Type:General ? ?Level of Consciousness: drowsy and patient cooperative ? ?Airway & Oxygen Therapy: Patient Spontanous Breathing and Patient connected to face mask oxygen ? ?Post-op Assessment: Report given to RN and Post -op Vital signs reviewed and stable ? ?Post vital signs: Reviewed and stable ? ?Last Vitals:  ?Vitals Value Taken Time  ?BP    ?Temp    ?Pulse 55 03/06/22 1302  ?Resp 17 03/06/22 1302  ?SpO2 100 % 03/06/22 1302  ?Vitals shown include unvalidated device data. ? ?Last Pain:  ?Vitals:  ? 03/06/22 1002  ?TempSrc: Oral  ?PainSc: 0-No pain  ?   ? ?Patients Stated Pain Goal: 3 (03/06/22 1002) ? ?Complications: No notable events documented. ?

## 2022-03-06 NOTE — Op Note (Signed)
Preoperative diagnosis: Clinical stage II right breast cancer ?Postoperative diagnosis: Same as above ?Procedure: Left internal jugular port placement ?Surgeon: Dr. Serita Grammes ?Anesthesia: General ?Estimated blood loss: Minimal ?Specimens: None ?Drains: None ?Complications: None ?Special count was correct completion ?Disposition recovery stable edition ? 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 She has C density breast. Her mammogram showed a new numerous ill-defined masses in the breast as well as some skin thickening. She underwent an ultrasound on the mammogram. There are 2 areas that measure 3.3 cm at 11:00 2.6 cm at 6:00. Both biopsy. There are other areas impacting this. These clips are almost 6 cm apart. Both of these biopsies were positive. She also has 1 abnormal axillary node that was positive. The breast is a grade 2 invasive lobular carcinoma that was 100% ER positive, PR negative, FISH is positive for HER2 and the Ki-67 is 10%. The node is also positive and HER2 positive. We elected to proceed with primary systemic therapy and plan for port placement.  ? ?Procedure: After informed consent was obtained she was taken to the operating room.   She was given antibiotics.  SCDs were in place.  She was placed under general anesthesia without complication.  She was prepped and draped in a standard sterile surgical fashion.  Surgical timeout was then performed. ? ?I then identified her left internal jugular vein with the ultrasound.  I made a small nick in the skin.  I then accessed this on the first pass with the needle.  The wire was placed.  This was confirmed to be in the vein by the ultrasound as well as by fluoroscopy.  I then placed local into the area below her clavicle where the pocket was made.  I made incision and developed a pocket.  I then tunneled the line between the 2 sites.  The dilator was then placed over the wire.  This was done  using fluoroscopy.  The wire was then removed.  The line was placed through the sheath and the sheath removed.  I pulled the line back to be in the distal cava near the cavoatrial junction.  This was a good position upon completion.  I then hooked the port up to the line.  I sutured this position with 2-0 Prolene suture.  I then accessed the port and left accessed for chemotherapy tomorrow.   Aspirated blood and flushed easily. I placed heparin in the port.  I then got a final completion film that showed this all to be in good position.  I then closed with 3-0 Vicryl 4-0 Monocryl.  Glue was placed.  She tolerated this well and was transferred to recovery. ?

## 2022-03-06 NOTE — Anesthesia Preprocedure Evaluation (Addendum)
Anesthesia Evaluation  ?Patient identified by MRN, date of birth, ID band ?Patient awake ? ? ? ?Reviewed: ?Allergy & Precautions, NPO status , Patient's Chart, lab work & pertinent test results ? ?Airway ?Mallampati: II ? ?TM Distance: >3 FB ?Neck ROM: Full ? ? ? Dental ? ?(+) Edentulous Upper, Partial Lower, Upper Dentures ?  ?Pulmonary ?COPD,  COPD inhaler, Current Smoker and Patient abstained from smoking.,  ?  ?Pulmonary exam normal ?breath sounds clear to auscultation ? ? ? ? ? ? Cardiovascular ?hypertension, Pt. on medications ?+ CAD  ?Normal cardiovascular exam ?Rhythm:Regular Rate:Normal ? ?TTE 2023 ??1. GLS calculated at -15.7 but does not appear to be completely tracking.  ??2. Left ventricular ejection fraction, by estimation, is 55 to 60%. The  ?left ventricle has normal function. The left ventricle has no regional  ?wall motion abnormalities. Left ventricular diastolic parameters were  ?normal.  ??3. Right ventricular systolic function is normal. The right ventricular  ?size is normal. There is mildly elevated pulmonary artery systolic  ?pressure.  ??4. The mitral valve is normal in structure. Trivial mitral valve  ?regurgitation. No evidence of mitral stenosis.  ??5. The aortic valve is tricuspid. Aortic valve regurgitation is not  ?visualized. Aortic valve sclerosis/calcification is present, without any  ?evidence of aortic stenosis.  ??6. The inferior vena cava is dilated in size with >50% respiratory  ?variability, suggesting right atrial pressure of 8 mmHg.  ?  ?Neuro/Psych ?PSYCHIATRIC DISORDERS Anxiety Depression negative neurological ROS ?   ? GI/Hepatic ?negative GI ROS, Neg liver ROS,   ?Endo/Other  ?negative endocrine ROS ? Renal/GU ?negative Renal ROS  ?negative genitourinary ?  ?Musculoskeletal ?negative musculoskeletal ROS ?(+)  ? Abdominal ?  ?Peds ? Hematology ?negative hematology ROS ?(+)   ?Anesthesia Other Findings ? ? Reproductive/Obstetrics ? ?   ? ? ? ? ? ? ? ? ? ? ? ? ? ?  ?  ? ? ? ? ? ? ? ?Anesthesia Physical ?Anesthesia Plan ? ?ASA: 3 ? ?Anesthesia Plan: General  ? ?Post-op Pain Management: Tylenol PO (pre-op)* and Toradol IV (intra-op)*  ? ?Induction: Intravenous ? ?PONV Risk Score and Plan: 2 and Ondansetron, Dexamethasone and Midazolam ? ?Airway Management Planned: LMA ? ?Additional Equipment:  ? ?Intra-op Plan:  ? ?Post-operative Plan: Extubation in OR ? ?Informed Consent: I have reviewed the patients History and Physical, chart, labs and discussed the procedure including the risks, benefits and alternatives for the proposed anesthesia with the patient or authorized representative who has indicated his/her understanding and acceptance.  ? ? ? ?Dental advisory given ? ?Plan Discussed with: CRNA ? ?Anesthesia Plan Comments:   ? ? ? ? ? ? ?Anesthesia Quick Evaluation ? ?

## 2022-03-06 NOTE — Interval H&P Note (Signed)
History and Physical Interval Note: ? ?03/06/2022 ?11:45 AM ? ?Ashley Robertson  has presented today for surgery, with the diagnosis of BREAST CANCER.  The various methods of treatment have been discussed with the patient and family. After consideration of risks, benefits and other options for treatment, the patient has consented to  Procedure(s): ?INSERTION PORT-A-CATH (N/A) as a surgical intervention.  The patient's history has been reviewed, patient examined, no change in status, stable for surgery.  I have reviewed the patient's chart and labs.  Questions were answered to the patient's satisfaction.   ? ? ?Rolm Bookbinder ? ? ?

## 2022-03-06 NOTE — H&P (Signed)
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 She has a family history in a paternal aunt with breast cancer in her father with sarcoma prostate cancer. She does have a history of COPD. She had a palpable right breast mass. She has see density breast. Her mammogram showed a new numerous ill-defined masses in the breast as well as some skin thickening. She has no redness or discharge noted. She underwent an ultrasound on the mammogram. There are 2 areas that measure 3.3 cm at 11:00 2.6 cm at 6:00. Both biopsy. There are other areas impacting this. These clips are almost 6 cm apart. Both of these biopsies were positive. She also has 1 abnormal axillary node that was positive. The breast is a grade 2 invasive lobular carcinoma that was 100% ER positive, PR negative, FISH is positive for HER2 and the Ki-67 is 10%. The node is also positive and HER2 positive. She comes in today with her daughter who is an Therapist, sports to discuss options. I have reviewed her mammogram and ultrasound and concur with the radiologic findings. I have discussed her case with medical oncology radiation oncology and radiology this morning and are stable. ? ?Review of Systems: ?A complete review of systems was obtained from the patient. I have reviewed this information and discussed as appropriate with the patient. See HPI as well for other ROS. ? ?Review of Systems  ?Respiratory: Positive for cough.  ?Psychiatric/Behavioral: Positive for depression.  ?All other systems reviewed and are negative. ? ? ?Medical History: ?Past Medical History:  ?Diagnosis Date  ? Anxiety  ? Hyperlipidemia  ? ?Patient Active Problem List  ?Diagnosis  ? Malignant neoplasm of overlapping sites of right breast in female, estrogen receptor positive (CMS-HCC)  ? Hypercholesterolemia  ? Essential (primary) hypertension  ? Current every day smoker  ? ?Past Surgical History:  ?Procedure Laterality Date  ? [place]  ? RHINOPLASTY   ?1981  ? TONSILLECTOMY  ?1978  ? ? ?Allergies  ?Allergen Reactions  ? Lisinopril Cough and Other (See Comments)  ?High potassium ? ? Atorvastatin Muscle Pain and Other (See Comments)  ?Myalgias ? ? ?Current Outpatient Medications on File Prior to Visit  ?Medication Sig Dispense Refill  ? albuterol 90 mcg/actuation inhaler 1-2 puffs as needed  ? amLODIPine (NORVASC) 10 MG tablet 1 tablet  ? aspirin 81 MG EC tablet 1 tablet  ? calcium carbonate 500 mg calcium (1,250 mg) tablet 1 tablet with meals  ? chlorthalidone 25 MG tablet  ? FLUoxetine (PROZAC) 20 MG capsule 1 capsule  ? rosuvastatin (CRESTOR) 5 MG tablet 2 tablets M/W/F and 1 tablet other days  ? ?Family History  ?Problem Relation Age of Onset  ? Hyperlipidemia (Elevated cholesterol) Father  ? ? ?Social History  ? ?Tobacco Use  ?Smoking Status Every Day  ? Types: Cigarettes  ?Smokeless Tobacco Not on file  ? ? ?Social History  ? ?Socioeconomic History  ? Marital status: Married  ?Tobacco Use  ? Smoking status: Every Day  ?Types: Cigarettes  ?Substance and Sexual Activity  ? Alcohol use: Not Currently  ? Drug use: Not Currently  ? ?Objective:  ?Physical Exam ?Vitals reviewed.  ?Constitutional:  ?Appearance: Normal appearance.  ?Chest:  ?Breasts: ?Right: Inverted nipple and mass present. No nipple discharge or skin change.  ?Left: No inverted nipple, mass, nipple discharge or skin change.  ?Comments: Right breast with inverted nipple and some dimpling and multiple masses ?Lymphadenopathy:  ?Upper  Body:  ?Right upper body: Axillary adenopathy (hematoma present in axilla) present. No supraclavicular adenopathy.  ?Left upper body: No supraclavicular or axillary adenopathy.  ?Neurological:  ?Mental Status: She is alert.  ? ? ?Assessment and Plan:  ? ?Right breast cancer, er positive, overlapping sites ? ?We discussed the staging and pathophysiology of breast cancer. We discussed all of the different options for treatment for breast cancer including surgery,  chemotherapy, radiation therapy, Herceptin, and antiestrogen therapy. ?With her 2 positive tumor and node positive I think primary systemic therapy best plan. Discussed port placement with her today.  ?We discussed a sentinel lymph node biopsy at time of surgery as she does not appear to having lymph node involvement right now. We discussed the performance of that with injection of tracer. We discussed that there is a chance of having a positive node with a sentinel lymph node biopsy and we will await the permanent pathology to make any other first further decisions in terms of her treatment. We discussed up to a 5% risk lifetime of chronic shoulder pain as well as lymphedema associated with a sentinel lymph node biopsy. ?We discussed the options for treatment of the breast cancer which included lumpectomy versus a mastectomy. We discussed the performance of the lumpectomy with radioactive seed placement. We discussed a 5-10% chance of a positive margin requiring reexcision in the operating room. We also discussed that she will likely need radiation therapy if she undergoes lumpectomy. We discussed mastectomy and the postoperative care for that as well. Mastectomy can be followed by reconstruction. The decision for lumpectomy vs mastectomy has no impact on decision for chemotherapy. Most mastectomy patients will not need radiation therapy. We discussed that there is no difference in her survival whether she undergoes lumpectomy with radiation therapy or antiestrogen therapy versus a mastectomy. There is also no real difference between her recurrence in the breast. I think she will likely still need a mastectomy at end of this and will likely need nac removed. She would be interested in reconstruction so will have her see plastics.  ?I will see her at end of chemotherapy portion and discuss final surgery with a repeat MRI. ?

## 2022-03-06 NOTE — Telephone Encounter (Signed)
Called and spoke with radiology to request an amendment to the ct abd/pelvis regarding cholystectomy.  ?

## 2022-03-07 ENCOUNTER — Encounter: Payer: Self-pay | Admitting: Hematology

## 2022-03-07 ENCOUNTER — Inpatient Hospital Stay: Payer: PPO

## 2022-03-07 ENCOUNTER — Encounter: Payer: Self-pay | Admitting: Emergency Medicine

## 2022-03-07 ENCOUNTER — Inpatient Hospital Stay: Payer: PPO | Admitting: Hematology

## 2022-03-07 ENCOUNTER — Encounter: Payer: Self-pay | Admitting: *Deleted

## 2022-03-07 VITALS — BP 134/74 | HR 53 | Temp 98.6°F | Resp 18 | Ht 69.0 in | Wt 142.4 lb

## 2022-03-07 VITALS — BP 141/69 | HR 54 | Resp 18

## 2022-03-07 DIAGNOSIS — Z170 Estrogen receptor positive status [ER+]: Secondary | ICD-10-CM

## 2022-03-07 DIAGNOSIS — I10 Essential (primary) hypertension: Secondary | ICD-10-CM | POA: Diagnosis not present

## 2022-03-07 DIAGNOSIS — Z7952 Long term (current) use of systemic steroids: Secondary | ICD-10-CM | POA: Diagnosis not present

## 2022-03-07 DIAGNOSIS — Z5111 Encounter for antineoplastic chemotherapy: Secondary | ICD-10-CM | POA: Diagnosis not present

## 2022-03-07 DIAGNOSIS — Z95828 Presence of other vascular implants and grafts: Secondary | ICD-10-CM

## 2022-03-07 DIAGNOSIS — Z7982 Long term (current) use of aspirin: Secondary | ICD-10-CM | POA: Diagnosis not present

## 2022-03-07 DIAGNOSIS — Z85828 Personal history of other malignant neoplasm of skin: Secondary | ICD-10-CM | POA: Diagnosis not present

## 2022-03-07 DIAGNOSIS — M8580 Other specified disorders of bone density and structure, unspecified site: Secondary | ICD-10-CM | POA: Diagnosis not present

## 2022-03-07 DIAGNOSIS — C50811 Malignant neoplasm of overlapping sites of right female breast: Secondary | ICD-10-CM

## 2022-03-07 DIAGNOSIS — Z79899 Other long term (current) drug therapy: Secondary | ICD-10-CM | POA: Diagnosis not present

## 2022-03-07 DIAGNOSIS — R7303 Prediabetes: Secondary | ICD-10-CM | POA: Diagnosis not present

## 2022-03-07 DIAGNOSIS — Z5189 Encounter for other specified aftercare: Secondary | ICD-10-CM | POA: Diagnosis not present

## 2022-03-07 DIAGNOSIS — C773 Secondary and unspecified malignant neoplasm of axilla and upper limb lymph nodes: Secondary | ICD-10-CM | POA: Diagnosis not present

## 2022-03-07 DIAGNOSIS — Z87891 Personal history of nicotine dependence: Secondary | ICD-10-CM | POA: Diagnosis not present

## 2022-03-07 DIAGNOSIS — Z5112 Encounter for antineoplastic immunotherapy: Secondary | ICD-10-CM | POA: Diagnosis not present

## 2022-03-07 DIAGNOSIS — F419 Anxiety disorder, unspecified: Secondary | ICD-10-CM | POA: Diagnosis not present

## 2022-03-07 LAB — CBC WITH DIFFERENTIAL (CANCER CENTER ONLY)
Abs Immature Granulocytes: 0.05 10*3/uL (ref 0.00–0.07)
Basophils Absolute: 0 10*3/uL (ref 0.0–0.1)
Basophils Relative: 0 %
Eosinophils Absolute: 0 10*3/uL (ref 0.0–0.5)
Eosinophils Relative: 0 %
HCT: 42.6 % (ref 36.0–46.0)
Hemoglobin: 14.7 g/dL (ref 12.0–15.0)
Immature Granulocytes: 0 %
Lymphocytes Relative: 8 %
Lymphs Abs: 1 10*3/uL (ref 0.7–4.0)
MCH: 33 pg (ref 26.0–34.0)
MCHC: 34.5 g/dL (ref 30.0–36.0)
MCV: 95.5 fL (ref 80.0–100.0)
Monocytes Absolute: 0.8 10*3/uL (ref 0.1–1.0)
Monocytes Relative: 7 %
Neutro Abs: 10.5 10*3/uL — ABNORMAL HIGH (ref 1.7–7.7)
Neutrophils Relative %: 85 %
Platelet Count: 279 10*3/uL (ref 150–400)
RBC: 4.46 MIL/uL (ref 3.87–5.11)
RDW: 13.2 % (ref 11.5–15.5)
WBC Count: 12.3 10*3/uL — ABNORMAL HIGH (ref 4.0–10.5)
nRBC: 0 % (ref 0.0–0.2)

## 2022-03-07 LAB — CMP (CANCER CENTER ONLY)
ALT: 12 U/L (ref 0–44)
AST: 14 U/L — ABNORMAL LOW (ref 15–41)
Albumin: 4.2 g/dL (ref 3.5–5.0)
Alkaline Phosphatase: 72 U/L (ref 38–126)
Anion gap: 6 (ref 5–15)
BUN: 12 mg/dL (ref 8–23)
CO2: 29 mmol/L (ref 22–32)
Calcium: 9.5 mg/dL (ref 8.9–10.3)
Chloride: 101 mmol/L (ref 98–111)
Creatinine: 0.54 mg/dL (ref 0.44–1.00)
GFR, Estimated: >60 mL/min (ref >60)
Glucose, Bld: 138 mg/dL — ABNORMAL HIGH (ref 70–99)
Potassium: 4 mmol/L (ref 3.5–5.1)
Sodium: 136 mmol/L (ref 135–145)
Total Bilirubin: 0.5 mg/dL (ref 0.3–1.2)
Total Protein: 7.2 g/dL (ref 6.5–8.1)

## 2022-03-07 MED ORDER — SODIUM CHLORIDE 0.9 % IV SOLN
407.5000 mg | Freq: Once | INTRAVENOUS | Status: AC
  Administered 2022-03-07: 410 mg via INTRAVENOUS
  Filled 2022-03-07: qty 41

## 2022-03-07 MED ORDER — PALONOSETRON HCL INJECTION 0.25 MG/5ML
0.2500 mg | Freq: Once | INTRAVENOUS | Status: AC
  Administered 2022-03-07: 0.25 mg via INTRAVENOUS
  Filled 2022-03-07: qty 5

## 2022-03-07 MED ORDER — SODIUM CHLORIDE 0.9 % IV SOLN
840.0000 mg | Freq: Once | INTRAVENOUS | Status: AC
  Administered 2022-03-07: 840 mg via INTRAVENOUS
  Filled 2022-03-07: qty 28

## 2022-03-07 MED ORDER — SODIUM CHLORIDE 0.9% FLUSH
10.0000 mL | INTRAVENOUS | Status: AC | PRN
  Administered 2022-03-07: 10 mL

## 2022-03-07 MED ORDER — SODIUM CHLORIDE 0.9 % IV SOLN
150.0000 mg | Freq: Once | INTRAVENOUS | Status: AC
  Administered 2022-03-07: 150 mg via INTRAVENOUS
  Filled 2022-03-07: qty 150

## 2022-03-07 MED ORDER — ACETAMINOPHEN 325 MG PO TABS
650.0000 mg | ORAL_TABLET | Freq: Once | ORAL | Status: AC
  Administered 2022-03-07: 650 mg via ORAL
  Filled 2022-03-07: qty 2

## 2022-03-07 MED ORDER — DIPHENHYDRAMINE HCL 25 MG PO CAPS
50.0000 mg | ORAL_CAPSULE | Freq: Once | ORAL | Status: AC
  Administered 2022-03-07: 50 mg via ORAL
  Filled 2022-03-07: qty 2

## 2022-03-07 MED ORDER — TRASTUZUMAB-DKST CHEMO 150 MG IV SOLR
8.0000 mg/kg | Freq: Once | INTRAVENOUS | Status: AC
  Administered 2022-03-07: 525 mg via INTRAVENOUS
  Filled 2022-03-07: qty 25

## 2022-03-07 MED ORDER — SODIUM CHLORIDE 0.9 % IV SOLN
75.0000 mg/m2 | Freq: Once | INTRAVENOUS | Status: AC
  Administered 2022-03-07: 130 mg via INTRAVENOUS
  Filled 2022-03-07: qty 13

## 2022-03-07 MED ORDER — SODIUM CHLORIDE 0.9 % IV SOLN: Freq: Once | INTRAVENOUS | Status: AC

## 2022-03-07 MED ORDER — SODIUM CHLORIDE 0.9% FLUSH
10.0000 mL | INTRAVENOUS | Status: DC | PRN
  Administered 2022-03-07: 10 mL

## 2022-03-07 MED ORDER — HEPARIN SOD (PORK) LOCK FLUSH 100 UNIT/ML IV SOLN
500.0000 [IU] | Freq: Once | INTRAVENOUS | Status: AC | PRN
  Administered 2022-03-07: 500 [IU]

## 2022-03-07 MED ORDER — SODIUM CHLORIDE 0.9 % IV SOLN
10.0000 mg | Freq: Once | INTRAVENOUS | Status: AC
  Administered 2022-03-07: 10 mg via INTRAVENOUS
  Filled 2022-03-07: qty 10

## 2022-03-07 NOTE — Progress Notes (Signed)
Left message stating courtesy call and if any questions or concerns please call the doctors office.  

## 2022-03-07 NOTE — Patient Instructions (Signed)
[facility]  Discharge Instructions: ?Thank you for choosing [facility] to provide your oncology and hematology care.  ? ?If you have a lab appointment with the [facility], please go directly to the [facility] and check in at the registration area. ?  ?Wear comfortable clothing and clothing appropriate for easy access to any Portacath or PICC line.  ? ?We strive to give you quality time with your provider. You may need to reschedule your appointment if you arrive late (15 or more minutes).  Arriving late affects you and other patients whose appointments are after yours.  Also, if you miss three or more appointments without notifying the office, you may be dismissed from the clinic at the provider?s discretion.    ?  ?For prescription refill requests, have your pharmacy contact our office and allow 72 hours for refills to be completed.   ? ?Today you received the following chemotherapy and/or immunotherapy agents herceptin, perjeta, docetaxel, carboplatin    ?  ?To help prevent nausea and vomiting after your treatment, we encourage you to take your nausea medication as directed. ? ?BELOW ARE SYMPTOMS THAT SHOULD BE REPORTED IMMEDIATELY: ?*FEVER GREATER THAN 100.4 F (38 ?C) OR HIGHER ?*CHILLS OR SWEATING ?*NAUSEA AND VOMITING THAT IS NOT CONTROLLED WITH YOUR NAUSEA MEDICATION ?*UNUSUAL SHORTNESS OF BREATH ?*UNUSUAL BRUISING OR BLEEDING ?*URINARY PROBLEMS (pain or burning when urinating, or frequent urination) ?*BOWEL PROBLEMS (unusual diarrhea, constipation, pain near the anus) ?TENDERNESS IN MOUTH AND THROAT WITH OR WITHOUT PRESENCE OF ULCERS (sore throat, sores in mouth, or a toothache) ?UNUSUAL RASH, SWELLING OR PAIN  ?UNUSUAL VAGINAL DISCHARGE OR ITCHING  ? ?Items with * indicate a potential emergency and should be followed up as soon as possible or go to the Emergency Department if any problems should occur. ? ?Please show the CHEMOTHERAPY ALERT CARD or  IMMUNOTHERAPY ALERT CARD at check-in to the Emergency Department and triage nurse. ? ?Should you have questions after your visit or need to cancel or reschedule your appointment, please contact [facility]  Dept: [phone]  and follow the prompts.  Office hours are 8:00 a.m. to 4:30 p.m. Monday - Friday. Please note that voicemails left after 4:00 p.m. may not be returned until the following business day.  We are closed weekends and major holidays. You have access to a nurse at all times for urgent questions. Please call the main number to the clinic Dept: [phone] and follow the prompts. ? ? ?For any non-urgent questions, you may also contact your provider using MyChart. We now offer e-Visits for anyone 54 and older to request care online for non-urgent symptoms. For details visit [email or web address]. ?  ?Also download the MyChart app! Go to the app store, search "MyChart", open the app, select [facility], and log in with your MyChart username and password. ? ?Due to Covid, a mask is required upon entering the hospital/clinic. If you do not have a mask, one will be given to you upon arrival. For doctor visits, patients may have 1 support person aged 14 or older with them. For treatment visits, patients cannot have anyone with them due to current Covid guidelines and our immunocompromised population.  ? ?

## 2022-03-07 NOTE — Progress Notes (Signed)
?[facility]   ?Telephone:(336) [phone] [phone]   ?Clinic Follow up Note  ? ?Patient Care Team: ?Rankins, Bill Salinas, MD as PCP - General (Family Medicine) ?Leonie Man, MD as PCP - Cardiology (Cardiology) ?Mauro Kaufmann, RN as Oncology Nurse Navigator ?Rockwell Germany, RN as Oncology Nurse Navigator ?Rolm Bookbinder, MD as Consulting Physician (General Surgery) ?Truitt Merle, MD as Consulting Physician (Hematology) ?Kyung Rudd, MD as Consulting Physician (Radiation Oncology) ? ?Date of Service:  03/07/2022 ? ?CHIEF COMPLAINT: f/u of right breast cancer ? ?CURRENT THERAPY:  ?Neoadjuvant TCHP, q21d, started 03/07/22 ? ?ASSESSMENT & PLAN:  ?Ashley Robertson is a 66 y.o. post-menopausal female with  ? ?1. Malignant neoplasm of overlapping sites of right breast, invasive lobular carcinoma, Stage IIIA, c(T3, N1), ER+/PR-/HER2+, Grade 2  ?-presented with new right breast mass. Right MM and [place] on 02/08/22 showed malignancy throughout right breast with discrete masses at 11 and 6 o'clock, and one borderline lymph node. Biopsy on 02/09/22 revealed invasive lobular carcinoma to both sites and metastatic carcinoma in lymph node. She has mass like hard tissue in most part of her right breast but no clinical concerns of inflammatory breast cancer, she also has a large palpable right axillary lymph node. ?-breast MRI on 03/01/22 showed: 8.5 cm area of confluent mass-like enhancement in right breast involving all 4 quadrants; biopsy-proven metastatic lymph node in right axilla; possible metastatic intramammary lymph node in posterior outer right breast; no evidence of malignancy on left. ?-staging CT CAP and bone scan on 03/05/22 were negative for metastatic disease. ?-baseline echo on 03/05/22 showed EF of 55-60%, normal. ?-she had port placed yesterday, 03/06/22, in anticipation of beginning neoadjuvant chemo, consisting of taxol, carboplatin, herceptin, and perjeta (TCHP), today. We briefly reviewed side  effects and management, and what to expect. Labs reviewed, adequate to proceed with first cycle today. ?-we will see her back in a week for toxicity check. ?  ?2. Bone Health  ?-Her most recent DEXA was on 04/29/20 showing osteopenia (T-score of -2.1 at L1-3). ?  ?3. COPD and Smoking Cessation ?-she reports she quit smoking on 03/01/22. She notes she is currently using a non-nicotine vape, which has aided her in quitting. ?  ?4. HTN and prediabetes, anxiety ?-Continue medication, and follow-up with PCP ?-We will follow-up her blood pressure and blood glucose closely during her chemotherapy ?  ? ?PLAN:  ?-proceed with cycle 1 TCHP today with GCSF on day 3  ?-lab, flush, and f/u in 1 week for toxicity check as scheduled ?-lab, flush, f/u, and TCHP every 3 weeks ? ? ?No problem-specific Assessment & Plan notes found for this encounter. ? ? ?SUMMARY OF ONCOLOGIC HISTORY: ?Oncology History Overview Note  ? Cancer Staging  ?Malignant neoplasm of overlapping sites of right breast in female, estrogen receptor positive ([place]) ?Staging form: Breast, AJCC 8th Edition ?- Clinical stage from 02/09/2022: Stage IIA (cT2, cN1, cM0, G2, ER+, PR-, HER2+) - Signed by Truitt Merle, MD on 02/20/2022 ? ?  ?Malignant neoplasm of overlapping sites of right breast in female, estrogen receptor positive ([place])  ?02/08/2022 Mammogram  ? CLINICAL DATA:  Acute onset right breast lump. ? ?EXAM: ?DIGITAL DIAGNOSTIC UNILATERAL RIGHT MAMMOGRAM WITH TOMOSYNTHESIS AND CAD; ULTRASOUND RIGHT BREAST LIMITED ? ?IMPRESSION: ?1. Highly suspicious findings mammographically and sonographically. I suspect there is significant malignancy throughout most of the right breast. Discrete masses are seen at 11 o'clock and 6 o'clock.  ?A single borderline lymph node is identified. This lymph  node appears prominent compared to the remainder of the lymph nodes. The skin thickening suggests the possibility of inflammatory breast cancer. ?  ?02/09/2022 Cancer Staging  ? Staging form:  Breast, AJCC 8th Edition ?- Clinical stage from 02/09/2022: Stage IIIA (cT3, cN1, cM0, G2, ER+, PR-, HER2+) - Signed by Truitt Merle, MD on 03/05/2022 ?Stage prefix: Initial diagnosis ?Histologic grading system: 3 grade system ? ?  ?02/09/2022 Initial Biopsy  ? Diagnosis ?1. Breast, right, needle core biopsy, right breast 11 o'clock mass ribbon clip ?- INVASIVE MAMMARY CARCINOMA ?- SEE COMMENT ?2. Breast, right, needle core biopsy, right breast 6'oclock mass, coil clip ?- INVASIVE MAMMARY CARCINOMA ?- SEE COMMENT ?3. Lymph node, needle/core biopsy, right axillary lymph node, tribell clip ?- METASTATIC CARCINOMA INVOLVING NODAL TISSUE ?Microscopic Comment ?1. and 2. The biopsy material shows an infiltrative proliferation of cells arranged linearly and in small clusters. Based ?on the biopsy, the carcinoma appears Nottingham grade 2 of 3 and measures 1.5 cm in greatest linear extent. ? ?Addendum parts 1 and 2: Immunohistochemistry for E-cadherin is negative consistent with lobular carcinoma. ? ?2. PROGNOSTIC INDICATORS ?Results: ?The tumor cells are EQUIVOCAL for Her2 (2+). Her2 by FISH will be performed and the results reported separately. ?Estrogen Receptor: 100%, POSITIVE, STRONG STAINING INTENSITY ?Progesterone Receptor: <1%, NEGATIVE ?Proliferation Marker Ki67: 10% ? ?2. FLUORESCENCE IN-SITU HYBRIDIZATION ?Results: ?GROUP 1: HER2 **POSITIVE** ? ? ?3. PROGNOSTIC INDICATORS ?Results: ?By immunohistochemistry, the tumor cells are POSITIVE for Her2 (3+). ?Estrogen Receptor: 100%, POSITIVE, STRONG STAINING INTENSITY ?Progesterone Receptor: <1%, NEGATIVE ?  ?02/16/2022 Initial Diagnosis  ? Malignant neoplasm of overlapping sites of right breast in female, estrogen receptor positive ([place]) ?  ?03/01/2022 Imaging  ? EXAM: ?BILATERAL BREAST MRI WITH AND WITHOUT CONTRAST ? ?IMPRESSION: ?1. 8.5 x 8.3 x 5.9 cm area of confluent mass-like enhancement in the right breast involving all 4 quadrants and containing 2 biopsy ?marker clip  artifacts, compatible with 4 quadrant biopsy-proven ?malignancy. ?2. Biopsy-proven metastatic lymph node in the right axilla. ?3. Possible metastatic intramammary lymph node in the posterior ?outer right breast just above the level of the nipple. ?4. No evidence of malignancy on the left. ?  ?03/05/2022 Imaging  ? EXAM: ?CT CHEST, ABDOMEN, AND PELVIS WITH CONTRAST ? ?IMPRESSION: ?1. Asymmetric, masslike density of the glandular tissue of the right ?breast with overlying skin thickening, in keeping with known primary breast malignancy. ?2. Enlarged, ill-defined right axillary lymph node containing a ?biopsy marking clip, consistent with known nodal metastatic disease. ?3. No other evidence of lymphadenopathy or metastatic disease in the chest, abdomen, or pelvis. ?4. Emphysema. Background of fine centrilobular nodularity, most ?concentrated in the lung apices, consistent with smoking-related ?respiratory bronchiolitis. ?5. Aneurysm of the infrarenal abdominal aorta measuring up to 5.4 x 5.3 cm with a large burden of eccentric mural thrombus. Recommend follow-up CT/MR every 6 months and vascular consultation. This recommendation follows ACR consensus guidelines: White Paper of the ACR Incidental Findings Committee II on Vascular Findings. J Am Coll Radiol 2013; 10:789-794. ?6. Coronary artery disease. ?  ?03/05/2022 Imaging  ? EXAM: ?NUCLEAR MEDICINE WHOLE BODY BONE SCAN ? ?IMPRESSION: ?No evidence of bony metastatic disease. ?  ?03/07/2022 -  Chemotherapy  ? Patient is on Treatment Plan : BREAST  Docetaxel + Carboplatin + Trastuzumab + Pertuzumab  (TCHP) q21d   ?   ? ? ? ?INTERVAL HISTORY:  ?Ashley Robertson is here for a follow up of breast cancer. She was last seen by me on 02/21/22 in consultation. She  presents to the clinic alone. ?She reports she is doing well overall. She endorses doing everything recommended prior to treatment (attended chemo class, picked up antiemetics, took dexamethasone yesterday, brought plastic  bags and compression sleeves, etc). ?  ?All other systems were reviewed with the patient and are negative. ? ?MEDICAL HISTORY:  ?Past Medical History:  ?Diagnosis Date  ? Allergy   ? Lisinopril and lipito

## 2022-03-07 NOTE — Research (Signed)
DCP-001: Use of a Clinical Trial Screening Tool to Address Cancer Health Disparities in the [facility]) ? ?03/07/22 ? ?Met with this patient during infusion to introduce this study.  Discussed study procedures and data collection involved.  The patient decided to not participate in this study.  She was thanked for her time and consideration of this study. ? ?Clabe Seal ?Clinical Research Coordinator I  ?03/07/22 11:00 AM ? ?

## 2022-03-08 ENCOUNTER — Telehealth: Payer: Self-pay

## 2022-03-08 NOTE — Telephone Encounter (Signed)
Ms Rund states that she is eating, drinking and urinating well. No issues noted. ?She knows to call the office at [phone] if she has any questions or concerns. ?

## 2022-03-08 NOTE — Telephone Encounter (Signed)
-----   Message from Gillian Shields, RN sent at 03/07/2022 12:16 PM EDT ----- ?Regarding: F/T TCHP Feng ?First time [facility] Dr. Burr Medico ? ?

## 2022-03-09 ENCOUNTER — Inpatient Hospital Stay: Payer: PPO

## 2022-03-09 ENCOUNTER — Other Ambulatory Visit: Payer: Self-pay

## 2022-03-09 VITALS — BP 138/78 | HR 54 | Temp 97.7°F | Resp 18

## 2022-03-09 DIAGNOSIS — C50811 Malignant neoplasm of overlapping sites of right female breast: Secondary | ICD-10-CM

## 2022-03-09 DIAGNOSIS — Z170 Estrogen receptor positive status [ER+]: Secondary | ICD-10-CM

## 2022-03-09 DIAGNOSIS — Z5112 Encounter for antineoplastic immunotherapy: Secondary | ICD-10-CM | POA: Diagnosis not present

## 2022-03-09 MED ORDER — PEGFILGRASTIM-CBQV 6 MG/0.6ML ~~LOC~~ SOSY
6.0000 mg | PREFILLED_SYRINGE | Freq: Once | SUBCUTANEOUS | Status: AC
  Administered 2022-03-09: 6 mg via SUBCUTANEOUS
  Filled 2022-03-09: qty 0.6

## 2022-03-12 ENCOUNTER — Telehealth: Payer: Self-pay | Admitting: Genetic Counselor

## 2022-03-12 ENCOUNTER — Encounter: Payer: Self-pay | Admitting: Genetic Counselor

## 2022-03-12 DIAGNOSIS — Z1379 Encounter for other screening for genetic and chromosomal anomalies: Secondary | ICD-10-CM | POA: Insufficient documentation

## 2022-03-12 NOTE — Telephone Encounter (Signed)
Contacted patient in attempt to disclose results of genetic testing.  LVM with contact information requesting a call back.  

## 2022-03-14 ENCOUNTER — Inpatient Hospital Stay: Payer: PPO

## 2022-03-14 ENCOUNTER — Other Ambulatory Visit: Payer: Self-pay

## 2022-03-14 ENCOUNTER — Encounter: Payer: Self-pay | Admitting: Hematology

## 2022-03-14 ENCOUNTER — Encounter: Payer: Self-pay | Admitting: *Deleted

## 2022-03-14 ENCOUNTER — Inpatient Hospital Stay: Payer: PPO | Admitting: Hematology

## 2022-03-14 VITALS — BP 136/70 | HR 67 | Temp 98.5°F | Resp 18 | Ht 69.0 in | Wt 138.2 lb

## 2022-03-14 DIAGNOSIS — C50811 Malignant neoplasm of overlapping sites of right female breast: Secondary | ICD-10-CM | POA: Diagnosis not present

## 2022-03-14 DIAGNOSIS — Z170 Estrogen receptor positive status [ER+]: Secondary | ICD-10-CM

## 2022-03-14 DIAGNOSIS — Z5112 Encounter for antineoplastic immunotherapy: Secondary | ICD-10-CM | POA: Diagnosis not present

## 2022-03-14 DIAGNOSIS — Z95828 Presence of other vascular implants and grafts: Secondary | ICD-10-CM

## 2022-03-14 LAB — CBC WITH DIFFERENTIAL (CANCER CENTER ONLY)
Abs Immature Granulocytes: 1.8 10*3/uL — ABNORMAL HIGH (ref 0.00–0.07)
Basophils Absolute: 0.1 10*3/uL (ref 0.0–0.1)
Basophils Relative: 1 %
Eosinophils Absolute: 0.1 10*3/uL (ref 0.0–0.5)
Eosinophils Relative: 1 %
HCT: 41.8 % (ref 36.0–46.0)
Hemoglobin: 14.6 g/dL (ref 12.0–15.0)
Immature Granulocytes: 16 %
Lymphocytes Relative: 18 %
Lymphs Abs: 2 10*3/uL (ref 0.7–4.0)
MCH: 33.3 pg (ref 26.0–34.0)
MCHC: 34.9 g/dL (ref 30.0–36.0)
MCV: 95.2 fL (ref 80.0–100.0)
Monocytes Absolute: 2.5 10*3/uL — ABNORMAL HIGH (ref 0.1–1.0)
Monocytes Relative: 23 %
Neutro Abs: 4.6 10*3/uL (ref 1.7–7.7)
Neutrophils Relative %: 41 %
Platelet Count: 166 10*3/uL (ref 150–400)
RBC: 4.39 MIL/uL (ref 3.87–5.11)
RDW: 13.1 % (ref 11.5–15.5)
Smear Review: NORMAL
WBC Count: 11 10*3/uL — ABNORMAL HIGH (ref 4.0–10.5)
nRBC: 0 % (ref 0.0–0.2)

## 2022-03-14 LAB — CMP (CANCER CENTER ONLY)
ALT: 18 U/L (ref 0–44)
AST: 19 U/L (ref 15–41)
Albumin: 3.8 g/dL (ref 3.5–5.0)
Alkaline Phosphatase: 80 U/L (ref 38–126)
Anion gap: 4 — ABNORMAL LOW (ref 5–15)
BUN: 14 mg/dL (ref 8–23)
CO2: 29 mmol/L (ref 22–32)
Calcium: 9.2 mg/dL (ref 8.9–10.3)
Chloride: 99 mmol/L (ref 98–111)
Creatinine: 0.64 mg/dL (ref 0.44–1.00)
GFR, Estimated: >60 mL/min (ref >60)
Glucose, Bld: 106 mg/dL — ABNORMAL HIGH (ref 70–99)
Potassium: 4.2 mmol/L (ref 3.5–5.1)
Sodium: 132 mmol/L — ABNORMAL LOW (ref 135–145)
Total Bilirubin: 0.4 mg/dL (ref 0.3–1.2)
Total Protein: 6.6 g/dL (ref 6.5–8.1)

## 2022-03-14 MED ORDER — SODIUM CHLORIDE 0.9% FLUSH
10.0000 mL | INTRAVENOUS | Status: DC | PRN
  Administered 2022-03-14: 10 mL via INTRAVENOUS

## 2022-03-14 MED ORDER — HEPARIN SOD (PORK) LOCK FLUSH 100 UNIT/ML IV SOLN
500.0000 [IU] | Freq: Once | INTRAVENOUS | Status: AC
  Administered 2022-03-14: 500 [IU] via INTRAVENOUS

## 2022-03-14 NOTE — Progress Notes (Signed)
?[facility]   ?Telephone:(336) [phone] [phone]   ?Clinic Follow up Note  ? ?Patient Care Team: ?Rankins, Bill Salinas, MD as PCP - General (Family Medicine) ?Leonie Man, MD as PCP - Cardiology (Cardiology) ?Mauro Kaufmann, RN as Oncology Nurse Navigator ?Rockwell Germany, RN as Oncology Nurse Navigator ?Rolm Bookbinder, MD as Consulting Physician (General Surgery) ?Truitt Merle, MD as Consulting Physician (Hematology) ?Kyung Rudd, MD as Consulting Physician (Radiation Oncology) ? ?Date of Service:  03/14/2022 ? ?CHIEF COMPLAINT: f/u of right breast cancer ? ?CURRENT THERAPY:  ?Neoadjuvant TCHP, q21d, started 03/07/22 ? ?ASSESSMENT & PLAN:  ?Ashley Robertson is a 66 y.o. post-menopausal female with  ? ?1. Malignant neoplasm of overlapping sites of right breast, invasive lobular carcinoma, Stage IIIA, c(T3, N1), ER+/PR-/HER2+, Grade 2  ?-presented with new right breast mass. Right MM and [place] on 02/08/22 showed malignancy throughout right breast with discrete masses at 11 and 6 o'clock, and one borderline lymph node. Biopsy on 02/09/22 revealed invasive lobular carcinoma to both sites and metastatic carcinoma in lymph node. She has mass like hard tissue in most part of her right breast but no clinical concerns of inflammatory breast cancer, she also has a large palpable right axillary lymph node. ?-breast MRI on 03/01/22 showed: 8.5 cm area of confluent mass-like enhancement in right breast involving all 4 quadrants; biopsy-proven metastatic lymph node in right axilla; possible metastatic intramammary lymph node in posterior outer right breast; no evidence of malignancy on left. ?-staging CT CAP and bone scan on 03/05/22 were negative for metastatic disease. ?-baseline echo on 03/05/22 showed EF of 55-60%, normal. ?-she began neoadjuvant TCHP on 03/07/22. She tolerated very well with no side effects. She only experienced mild constipation from the nausea medicine, otherwise she tolerated first cycle  chem overy well. Labs reviewed, overall no concern. ?-she will return for cycle 2 in 2 weeks ?  ?2. Bone Health  ?-Her most recent DEXA was on 04/29/20 showing osteopenia (T-score of -2.1 at L1-3). ?  ?3. COPD and Smoking Cessation ?-she reports she quit smoking on 03/01/22. She notes she is currently using a non-nicotine vape, which has aided her in quitting. ?  ?4. HTN and prediabetes, anxiety ?-Continue medication, and follow-up with PCP ?-We will follow-up her blood pressure and blood glucose closely during her chemotherapy ?  ?  ?PLAN:  ?-lab, flush, f/u, and TCHP in 2 weeks ? ? ?No problem-specific Assessment & Plan notes found for this encounter. ? ? ?SUMMARY OF ONCOLOGIC HISTORY: ?Oncology History Overview Note  ? Cancer Staging  ?Malignant neoplasm of overlapping sites of right breast in female, estrogen receptor positive ([place]) ?Staging form: Breast, AJCC 8th Edition ?- Clinical stage from 02/09/2022: Stage IIA (cT2, cN1, cM0, G2, ER+, PR-, HER2+) - Signed by Truitt Merle, MD on 02/20/2022 ? ?  ?Malignant neoplasm of overlapping sites of right breast in female, estrogen receptor positive ([place])  ?02/08/2022 Mammogram  ? CLINICAL DATA:  Acute onset right breast lump. ? ?EXAM: ?DIGITAL DIAGNOSTIC UNILATERAL RIGHT MAMMOGRAM WITH TOMOSYNTHESIS AND CAD; ULTRASOUND RIGHT BREAST LIMITED ? ?IMPRESSION: ?1. Highly suspicious findings mammographically and sonographically. I suspect there is significant malignancy throughout most of the right breast. Discrete masses are seen at 11 o'clock and 6 o'clock.  ?A single borderline lymph node is identified. This lymph node appears prominent compared to the remainder of the lymph nodes. The skin thickening suggests the possibility of inflammatory breast cancer. ?  ?02/09/2022 Cancer Staging  ? Staging form:  Breast, AJCC 8th Edition ?- Clinical stage from 02/09/2022: Stage IIIA (cT3, cN1, cM0, G2, ER+, PR-, HER2+) - Signed by Truitt Merle, MD on 03/05/2022 ?Stage prefix: Initial  diagnosis ?Histologic grading system: 3 grade system ? ?  ?02/09/2022 Initial Biopsy  ? Diagnosis ?1. Breast, right, needle core biopsy, right breast 11 o'clock mass ribbon clip ?- INVASIVE MAMMARY CARCINOMA ?- SEE COMMENT ?2. Breast, right, needle core biopsy, right breast 6'oclock mass, coil clip ?- INVASIVE MAMMARY CARCINOMA ?- SEE COMMENT ?3. Lymph node, needle/core biopsy, right axillary lymph node, tribell clip ?- METASTATIC CARCINOMA INVOLVING NODAL TISSUE ?Microscopic Comment ?1. and 2. The biopsy material shows an infiltrative proliferation of cells arranged linearly and in small clusters. Based ?on the biopsy, the carcinoma appears Nottingham grade 2 of 3 and measures 1.5 cm in greatest linear extent. ? ?Addendum parts 1 and 2: Immunohistochemistry for E-cadherin is negative consistent with lobular carcinoma. ? ?2. PROGNOSTIC INDICATORS ?Results: ?The tumor cells are EQUIVOCAL for Her2 (2+). Her2 by FISH will be performed and the results reported separately. ?Estrogen Receptor: 100%, POSITIVE, STRONG STAINING INTENSITY ?Progesterone Receptor: <1%, NEGATIVE ?Proliferation Marker Ki67: 10% ? ?2. FLUORESCENCE IN-SITU HYBRIDIZATION ?Results: ?GROUP 1: HER2 **POSITIVE** ? ? ?3. PROGNOSTIC INDICATORS ?Results: ?By immunohistochemistry, the tumor cells are POSITIVE for Her2 (3+). ?Estrogen Receptor: 100%, POSITIVE, STRONG STAINING INTENSITY ?Progesterone Receptor: <1%, NEGATIVE ?  ?02/16/2022 Initial Diagnosis  ? Malignant neoplasm of overlapping sites of right breast in female, estrogen receptor positive ([place]) ?  ?03/01/2022 Imaging  ? EXAM: ?BILATERAL BREAST MRI WITH AND WITHOUT CONTRAST ? ?IMPRESSION: ?1. 8.5 x 8.3 x 5.9 cm area of confluent mass-like enhancement in the right breast involving all 4 quadrants and containing 2 biopsy ?marker clip artifacts, compatible with 4 quadrant biopsy-proven ?malignancy. ?2. Biopsy-proven metastatic lymph node in the right axilla. ?3. Possible metastatic intramammary lymph  node in the posterior ?outer right breast just above the level of the nipple. ?4. No evidence of malignancy on the left. ?  ?03/01/2022 Genetic Testing  ? Negative hereditary cancer genetic testing: no pathogenic variants detected in [facility] CustomNext-Cancer +RNAinsight Panel.  Variant of uncertain significance reported in BRIP1 at p.F934V (c.2800T>G). Report date is March 01, 2022.   ? ?The CustomNext-Cancer+RNAinsight panel offered by [facility] includes sequencing and rearrangement analysis for the following 47 genes:  APC, ATM, AXIN2, BARD1, BMPR1A, BRCA1, BRCA2, BRIP1, CDH1, CDK4, CDKN2A, CHEK2, DICER1, EPCAM, GREM1, HOXB13, MEN1, MLH1, MSH2, MSH3, MSH6, MUTYH, NBN, NF1, NF2, NTHL1, PALB2, PMS2, POLD1, POLE, PTEN, RAD51C, RAD51D, RECQL, RET, SDHA, SDHAF2, SDHB, SDHC, SDHD, SMAD4, SMARCA4, STK11, TP53, TSC1, TSC2, and VHL.  RNA data is routinely analyzed for use in variant interpretation for all genes. ?  ?03/05/2022 Imaging  ? EXAM: ?CT CHEST, ABDOMEN, AND PELVIS WITH CONTRAST ? ?IMPRESSION: ?1. Asymmetric, masslike density of the glandular tissue of the right ?breast with overlying skin thickening, in keeping with known primary breast malignancy. ?2. Enlarged, ill-defined right axillary lymph node containing a ?biopsy marking clip, consistent with known nodal metastatic disease. ?3. No other evidence of lymphadenopathy or metastatic disease in the chest, abdomen, or pelvis. ?4. Emphysema. Background of fine centrilobular nodularity, most ?concentrated in the lung apices, consistent with smoking-related ?respiratory bronchiolitis. ?5. Aneurysm of the infrarenal abdominal aorta measuring up to 5.4 x 5.3 cm with a large burden of eccentric mural thrombus. Recommend follow-up CT/MR every 6 months and vascular consultation. This recommendation follows ACR consensus guidelines: White Paper of the ACR Incidental Findings Committee II on Vascular Findings.  J Am Coll Radiol 2013; 10:789-794. ?6. Coronary artery  disease. ?  ?03/05/2022 Imaging  ? EXAM: ?NUCLEAR MEDICINE WHOLE BODY BONE SCAN ? ?IMPRESSION: ?No evidence of bony metastatic disease. ?  ?03/07/2022 -  Chemotherapy  ? Patient is on Treatment Plan : BREAST  Docetaxel + Carbop

## 2022-03-15 ENCOUNTER — Ambulatory Visit: Payer: Self-pay | Admitting: Genetic Counselor

## 2022-03-15 ENCOUNTER — Telehealth: Payer: Self-pay | Admitting: Genetic Counselor

## 2022-03-15 ENCOUNTER — Other Ambulatory Visit (HOSPITAL_COMMUNITY): Payer: Self-pay

## 2022-03-15 ENCOUNTER — Other Ambulatory Visit: Payer: Self-pay | Admitting: Cardiology

## 2022-03-15 DIAGNOSIS — Z170 Estrogen receptor positive status [ER+]: Secondary | ICD-10-CM

## 2022-03-15 DIAGNOSIS — Z8042 Family history of malignant neoplasm of prostate: Secondary | ICD-10-CM

## 2022-03-15 DIAGNOSIS — Z1379 Encounter for other screening for genetic and chromosomal anomalies: Secondary | ICD-10-CM

## 2022-03-15 DIAGNOSIS — Z803 Family history of malignant neoplasm of breast: Secondary | ICD-10-CM

## 2022-03-15 MED ORDER — AMLODIPINE BESYLATE 10 MG PO TABS
ORAL_TABLET | Freq: Every day | ORAL | 0 refills | Status: DC
  Filled 2022-03-15: qty 30, 30d supply, fill #0

## 2022-03-15 NOTE — Progress Notes (Signed)
HPI:   ?Ashley Robertson was previously seen in the [facility] clinic due to a personal and family history of cancer and concerns regarding a hereditary predisposition to cancer. Please refer to our prior cancer genetics clinic note for more information regarding our discussion, assessment and recommendations, at the time. Ashley Robertson recent genetic test results were disclosed to her, as were recommendations warranted by these results. These results and recommendations are discussed in more detail below. ? ?CANCER HISTORY:  ?Oncology History Overview Note  ? Cancer Staging  ?Malignant neoplasm of overlapping sites of right breast in female, estrogen receptor positive ([place]) ?Staging form: Breast, AJCC 8th Edition ?- Clinical stage from 02/09/2022: Stage IIA (cT2, cN1, cM0, G2, ER+, PR-, HER2+) - Signed by Truitt Merle, MD on 02/20/2022 ? ?  ?Malignant neoplasm of overlapping sites of right breast in female, estrogen receptor positive ([place])  ?02/08/2022 Mammogram  ? CLINICAL DATA:  Acute onset right breast lump. ? ?EXAM: ?DIGITAL DIAGNOSTIC UNILATERAL RIGHT MAMMOGRAM WITH TOMOSYNTHESIS AND CAD; ULTRASOUND RIGHT BREAST LIMITED ? ?IMPRESSION: ?1. Highly suspicious findings mammographically and sonographically. I suspect there is significant malignancy throughout most of the right breast. Discrete masses are seen at 11 o'clock and 6 o'clock.  ?A single borderline lymph node is identified. This lymph node appears prominent compared to the remainder of the lymph nodes. The skin thickening suggests the possibility of inflammatory breast cancer. ?  ?02/09/2022 Cancer Staging  ? Staging form: Breast, AJCC 8th Edition ?- Clinical stage from 02/09/2022: Stage IIIA (cT3, cN1, cM0, G2, ER+, PR-, HER2+) - Signed by Truitt Merle, MD on 03/05/2022 ?Stage prefix: Initial diagnosis ?Histologic grading system: 3 grade system ? ?  ?02/09/2022 Initial Biopsy  ? Diagnosis ?1. Breast, right, needle core biopsy, right breast 11 o'clock mass  ribbon clip ?- INVASIVE MAMMARY CARCINOMA ?- SEE COMMENT ?2. Breast, right, needle core biopsy, right breast 6'oclock mass, coil clip ?- INVASIVE MAMMARY CARCINOMA ?- SEE COMMENT ?3. Lymph node, needle/core biopsy, right axillary lymph node, tribell clip ?- METASTATIC CARCINOMA INVOLVING NODAL TISSUE ?Microscopic Comment ?1. and 2. The biopsy material shows an infiltrative proliferation of cells arranged linearly and in small clusters. Based ?on the biopsy, the carcinoma appears Nottingham grade 2 of 3 and measures 1.5 cm in greatest linear extent. ? ?Addendum parts 1 and 2: Immunohistochemistry for E-cadherin is negative consistent with lobular carcinoma. ? ?2. PROGNOSTIC INDICATORS ?Results: ?The tumor cells are EQUIVOCAL for Her2 (2+). Her2 by FISH will be performed and the results reported separately. ?Estrogen Receptor: 100%, POSITIVE, STRONG STAINING INTENSITY ?Progesterone Receptor: <1%, NEGATIVE ?Proliferation Marker Ki67: 10% ? ?2. FLUORESCENCE IN-SITU HYBRIDIZATION ?Results: ?GROUP 1: HER2 **POSITIVE** ? ? ?3. PROGNOSTIC INDICATORS ?Results: ?By immunohistochemistry, the tumor cells are POSITIVE for Her2 (3+). ?Estrogen Receptor: 100%, POSITIVE, STRONG STAINING INTENSITY ?Progesterone Receptor: <1%, NEGATIVE ?  ?02/16/2022 Initial Diagnosis  ? Malignant neoplasm of overlapping sites of right breast in female, estrogen receptor positive ([place]) ?  ?03/01/2022 Imaging  ? EXAM: ?BILATERAL BREAST MRI WITH AND WITHOUT CONTRAST ? ?IMPRESSION: ?1. 8.5 x 8.3 x 5.9 cm area of confluent mass-like enhancement in the right breast involving all 4 quadrants and containing 2 biopsy ?marker clip artifacts, compatible with 4 quadrant biopsy-proven ?malignancy. ?2. Biopsy-proven metastatic lymph node in the right axilla. ?3. Possible metastatic intramammary lymph node in the posterior ?outer right breast just above the level of the nipple. ?4. No evidence of malignancy on the left. ?  ?03/01/2022 Genetic Testing  ? Negative  hereditary  cancer genetic testing: no pathogenic variants detected in [facility] CustomNext-Cancer +RNAinsight Panel.  Variant of uncertain significance reported in BRIP1 at p.F934V (c.2800T>G). Report date is March 01, 2022.   ? ?The CustomNext-Cancer+RNAinsight panel offered by [facility] includes sequencing and rearrangement analysis for the following 47 genes:  APC, ATM, AXIN2, BARD1, BMPR1A, BRCA1, BRCA2, BRIP1, CDH1, CDK4, CDKN2A, CHEK2, DICER1, EPCAM, GREM1, HOXB13, MEN1, MLH1, MSH2, MSH3, MSH6, MUTYH, NBN, NF1, NF2, NTHL1, PALB2, PMS2, POLD1, POLE, PTEN, RAD51C, RAD51D, RECQL, RET, SDHA, SDHAF2, SDHB, SDHC, SDHD, SMAD4, SMARCA4, STK11, TP53, TSC1, TSC2, and VHL.  RNA data is routinely analyzed for use in variant interpretation for all genes. ?  ?03/05/2022 Imaging  ? EXAM: ?CT CHEST, ABDOMEN, AND PELVIS WITH CONTRAST ? ?IMPRESSION: ?1. Asymmetric, masslike density of the glandular tissue of the right ?breast with overlying skin thickening, in keeping with known primary breast malignancy. ?2. Enlarged, ill-defined right axillary lymph node containing a ?biopsy marking clip, consistent with known nodal metastatic disease. ?3. No other evidence of lymphadenopathy or metastatic disease in the chest, abdomen, or pelvis. ?4. Emphysema. Background of fine centrilobular nodularity, most ?concentrated in the lung apices, consistent with smoking-related ?respiratory bronchiolitis. ?5. Aneurysm of the infrarenal abdominal aorta measuring up to 5.4 x 5.3 cm with a large burden of eccentric mural thrombus. Recommend follow-up CT/MR every 6 months and vascular consultation. This recommendation follows ACR consensus guidelines: White Paper of the ACR Incidental Findings Committee II on Vascular Findings. J Am Coll Radiol 2013; 10:789-794. ?6. Coronary artery disease. ?  ?03/05/2022 Imaging  ? EXAM: ?NUCLEAR MEDICINE WHOLE BODY BONE SCAN ? ?IMPRESSION: ?No evidence of bony metastatic disease. ?  ?03/07/2022 -  Chemotherapy  ?  Patient is on Treatment Plan : BREAST  Docetaxel + Carboplatin + Trastuzumab + Pertuzumab  (TCHP) q21d   ?   ? ? ?FAMILY HISTORY:  ?We obtained a detailed, 4-generation family history.  Significant diagnoses are listed below: ?Family History  ?Problem Relation Age of Onset  ? Cancer - Other Father   ?     sarcoma--left shoulder  ? Prostate cancer Father   ?     dx after 29  ? Lung cancer Brother   ?     dx 44s  ? Skin cancer Maternal Aunt   ? Breast cancer Paternal Aunt   ?     dx after 16  ? Lung cancer Maternal Grandmother   ?     dx 8s  ? Liver cancer Maternal Grandfather   ?     dx 44s  ? Breast cancer Niece   ?     dx 53s  ? ? ?Ashley Robertson is unaware of previous family history of genetic testing for hereditary cancer risks. There is no reported Ashkenazi Jewish ancestry. There is no known consanguinity. ? ?GENETIC TEST RESULTS:  ?The [facility] CustomNext-Cancer +RNAinsight Panel found no pathogenic mutations. The CustomNext-Cancer+RNAinsight panel offered by [facility] includes sequencing and rearrangement analysis for the following 47 genes:  APC, ATM, AXIN2, BARD1, BMPR1A, BRCA1, BRCA2, BRIP1, CDH1, CDK4, CDKN2A, CHEK2, DICER1, EPCAM, GREM1, HOXB13, MEN1, MLH1, MSH2, MSH3, MSH6, MUTYH, NBN, NF1, NF2, NTHL1, PALB2, PMS2, POLD1, POLE, PTEN, RAD51C, RAD51D, RECQL, RET, SDHA, SDHAF2, SDHB, SDHC, SDHD, SMAD4, SMARCA4, STK11, TP53, TSC1, TSC2, and VHL.  RNA data is routinely analyzed for use in variant interpretation for all genes. ? ?The test report has been scanned into EPIC and is located under the Molecular Pathology section of the Results Review tab.  A portion  of the result report is included below for reference. Genetic testing reported out on March 01, 2022.  ? ? ? ? ?Genetic testing identified a variant of uncertain significance (VUS) in the BRIP1 gene called c.2800T>G (p.F934V).  At this time, it is unknown if this variant is associated with an increased risk for cancer or if it is benign, but most  uncertain variants are reclassified to benign. It should not be used to make medical management decisions. With time, we suspect the laboratory will determine the significance of this variant, if any. If the labor

## 2022-03-15 NOTE — Telephone Encounter (Signed)
Revealed negative genetic testing and VUS in BRIP1.  Discussed that we do not know why she has breast cancer or why there is cancer in the family. It could be sporadic/famillial, due to a change in a gene that she did not inherit, due to a different gene that we are not testing, or maybe our current technology may not be able to pick something up.  It will be important for her to keep in contact with genetics to keep up with whether additional testing may be needed.  Recommended genetic testing for her niece diagnosed with breast cancer in her 70s.  ? ? ?

## 2022-03-16 ENCOUNTER — Other Ambulatory Visit (HOSPITAL_COMMUNITY): Payer: Self-pay

## 2022-03-16 MED ORDER — FLUOXETINE HCL 20 MG PO CAPS
20.0000 mg | ORAL_CAPSULE | Freq: Every day | ORAL | 1 refills | Status: DC
  Filled 2022-03-16: qty 90, 90d supply, fill #0

## 2022-03-27 ENCOUNTER — Other Ambulatory Visit (HOSPITAL_COMMUNITY): Payer: Self-pay

## 2022-03-27 DIAGNOSIS — Z95828 Presence of other vascular implants and grafts: Secondary | ICD-10-CM | POA: Insufficient documentation

## 2022-03-28 ENCOUNTER — Inpatient Hospital Stay (HOSPITAL_BASED_OUTPATIENT_CLINIC_OR_DEPARTMENT_OTHER): Payer: PPO | Admitting: Hematology

## 2022-03-28 ENCOUNTER — Other Ambulatory Visit: Payer: Self-pay

## 2022-03-28 ENCOUNTER — Inpatient Hospital Stay: Payer: PPO

## 2022-03-28 ENCOUNTER — Encounter: Payer: Self-pay | Admitting: Hematology

## 2022-03-28 VITALS — BP 136/73 | HR 62 | Temp 97.9°F | Resp 18 | Wt 148.4 lb

## 2022-03-28 DIAGNOSIS — C50811 Malignant neoplasm of overlapping sites of right female breast: Secondary | ICD-10-CM

## 2022-03-28 DIAGNOSIS — F3340 Major depressive disorder, recurrent, in remission, unspecified: Secondary | ICD-10-CM | POA: Insufficient documentation

## 2022-03-28 DIAGNOSIS — J439 Emphysema, unspecified: Secondary | ICD-10-CM | POA: Insufficient documentation

## 2022-03-28 DIAGNOSIS — I2510 Atherosclerotic heart disease of native coronary artery without angina pectoris: Secondary | ICD-10-CM | POA: Insufficient documentation

## 2022-03-28 DIAGNOSIS — Z5112 Encounter for antineoplastic immunotherapy: Secondary | ICD-10-CM | POA: Diagnosis not present

## 2022-03-28 DIAGNOSIS — I6529 Occlusion and stenosis of unspecified carotid artery: Secondary | ICD-10-CM | POA: Insufficient documentation

## 2022-03-28 DIAGNOSIS — Z170 Estrogen receptor positive status [ER+]: Secondary | ICD-10-CM

## 2022-03-28 DIAGNOSIS — Z95828 Presence of other vascular implants and grafts: Secondary | ICD-10-CM

## 2022-03-28 LAB — CMP (CANCER CENTER ONLY)
ALT: 20 U/L (ref 0–44)
AST: 14 U/L — ABNORMAL LOW (ref 15–41)
Albumin: 3.4 g/dL — ABNORMAL LOW (ref 3.5–5.0)
Alkaline Phosphatase: 83 U/L (ref 38–126)
Anion gap: 6 (ref 5–15)
BUN: 20 mg/dL (ref 8–23)
CO2: 27 mmol/L (ref 22–32)
Calcium: 8.8 mg/dL — ABNORMAL LOW (ref 8.9–10.3)
Chloride: 104 mmol/L (ref 98–111)
Creatinine: 0.51 mg/dL (ref 0.44–1.00)
GFR, Estimated: >60 mL/min (ref >60)
Glucose, Bld: 141 mg/dL — ABNORMAL HIGH (ref 70–99)
Potassium: 4.2 mmol/L (ref 3.5–5.1)
Sodium: 137 mmol/L (ref 135–145)
Total Bilirubin: 0.2 mg/dL — ABNORMAL LOW (ref 0.3–1.2)
Total Protein: 6.3 g/dL — ABNORMAL LOW (ref 6.5–8.1)

## 2022-03-28 LAB — CBC WITH DIFFERENTIAL (CANCER CENTER ONLY)
Abs Immature Granulocytes: 0.09 10*3/uL — ABNORMAL HIGH (ref 0.00–0.07)
Basophils Absolute: 0.1 10*3/uL (ref 0.0–0.1)
Basophils Relative: 1 %
Eosinophils Absolute: 0 10*3/uL (ref 0.0–0.5)
Eosinophils Relative: 0 %
HCT: 33.6 % — ABNORMAL LOW (ref 36.0–46.0)
Hemoglobin: 11.5 g/dL — ABNORMAL LOW (ref 12.0–15.0)
Immature Granulocytes: 1 %
Lymphocytes Relative: 8 %
Lymphs Abs: 1.1 10*3/uL (ref 0.7–4.0)
MCH: 33 pg (ref 26.0–34.0)
MCHC: 34.2 g/dL (ref 30.0–36.0)
MCV: 96.3 fL (ref 80.0–100.0)
Monocytes Absolute: 1.1 10*3/uL — ABNORMAL HIGH (ref 0.1–1.0)
Monocytes Relative: 9 %
Neutro Abs: 10.8 10*3/uL — ABNORMAL HIGH (ref 1.7–7.7)
Neutrophils Relative %: 81 %
Platelet Count: 485 10*3/uL — ABNORMAL HIGH (ref 150–400)
RBC: 3.49 MIL/uL — ABNORMAL LOW (ref 3.87–5.11)
RDW: 13.4 % (ref 11.5–15.5)
WBC Count: 13.1 10*3/uL — ABNORMAL HIGH (ref 4.0–10.5)
nRBC: 0 % (ref 0.0–0.2)

## 2022-03-28 MED ORDER — SODIUM CHLORIDE 0.9 % IV SOLN
10.0000 mg | Freq: Once | INTRAVENOUS | Status: AC
  Administered 2022-03-28: 10 mg via INTRAVENOUS
  Filled 2022-03-28: qty 10

## 2022-03-28 MED ORDER — DIPHENHYDRAMINE HCL 25 MG PO CAPS
50.0000 mg | ORAL_CAPSULE | Freq: Once | ORAL | Status: AC
  Administered 2022-03-28: 50 mg via ORAL
  Filled 2022-03-28: qty 2

## 2022-03-28 MED ORDER — SODIUM CHLORIDE 0.9 % IV SOLN
150.0000 mg | Freq: Once | INTRAVENOUS | Status: AC
  Administered 2022-03-28: 150 mg via INTRAVENOUS
  Filled 2022-03-28: qty 150

## 2022-03-28 MED ORDER — ACETAMINOPHEN 325 MG PO TABS
650.0000 mg | ORAL_TABLET | Freq: Once | ORAL | Status: AC
  Administered 2022-03-28: 650 mg via ORAL
  Filled 2022-03-28: qty 2

## 2022-03-28 MED ORDER — SODIUM CHLORIDE 0.9% FLUSH
10.0000 mL | Freq: Once | INTRAVENOUS | Status: AC
  Administered 2022-03-28: 10 mL

## 2022-03-28 MED ORDER — SODIUM CHLORIDE 0.9% FLUSH
10.0000 mL | INTRAVENOUS | Status: DC | PRN
  Administered 2022-03-28: 10 mL

## 2022-03-28 MED ORDER — SODIUM CHLORIDE 0.9 % IV SOLN
75.0000 mg/m2 | Freq: Once | INTRAVENOUS | Status: AC
  Administered 2022-03-28: 130 mg via INTRAVENOUS
  Filled 2022-03-28: qty 13

## 2022-03-28 MED ORDER — TRASTUZUMAB-DKST CHEMO 150 MG IV SOLR
6.0000 mg/kg | Freq: Once | INTRAVENOUS | Status: AC
  Administered 2022-03-28: 378 mg via INTRAVENOUS
  Filled 2022-03-28: qty 18

## 2022-03-28 MED ORDER — HEPARIN SOD (PORK) LOCK FLUSH 100 UNIT/ML IV SOLN
500.0000 [IU] | Freq: Once | INTRAVENOUS | Status: AC | PRN
  Administered 2022-03-28: 500 [IU]

## 2022-03-28 MED ORDER — SODIUM CHLORIDE 0.9 % IV SOLN
420.0000 mg | Freq: Once | INTRAVENOUS | Status: AC
  Administered 2022-03-28: 420 mg via INTRAVENOUS
  Filled 2022-03-28: qty 14

## 2022-03-28 MED ORDER — SODIUM CHLORIDE 0.9 % IV SOLN
450.0000 mg | Freq: Once | INTRAVENOUS | Status: AC
  Administered 2022-03-28: 450 mg via INTRAVENOUS
  Filled 2022-03-28: qty 45

## 2022-03-28 MED ORDER — PALONOSETRON HCL INJECTION 0.25 MG/5ML
0.2500 mg | Freq: Once | INTRAVENOUS | Status: AC
  Administered 2022-03-28: 0.25 mg via INTRAVENOUS
  Filled 2022-03-28: qty 5

## 2022-03-28 MED ORDER — SODIUM CHLORIDE 0.9 % IV SOLN: Freq: Once | INTRAVENOUS | Status: AC

## 2022-03-28 NOTE — Patient Instructions (Signed)
[facility]  Discharge Instructions: ?Thank you for choosing [facility] to provide your oncology and hematology care.  ? ?If you have a lab appointment with the [facility], please go directly to the [facility] and check in at the registration area. ?  ?Wear comfortable clothing and clothing appropriate for easy access to any Portacath or PICC line.  ? ?We strive to give you quality time with your provider. You may need to reschedule your appointment if you arrive late (15 or more minutes).  Arriving late affects you and other patients whose appointments are after yours.  Also, if you miss three or more appointments without notifying the office, you may be dismissed from the clinic at the provider?s discretion.    ?  ?For prescription refill requests, have your pharmacy contact our office and allow 72 hours for refills to be completed.   ? ?Today you received the following chemotherapy and/or immunotherapy agents herceptin, perjeta, docetaxel, carboplatin    ?  ?To help prevent nausea and vomiting after your treatment, we encourage you to take your nausea medication as directed. ? ?BELOW ARE SYMPTOMS THAT SHOULD BE REPORTED IMMEDIATELY: ?*FEVER GREATER THAN 100.4 F (38 ?C) OR HIGHER ?*CHILLS OR SWEATING ?*NAUSEA AND VOMITING THAT IS NOT CONTROLLED WITH YOUR NAUSEA MEDICATION ?*UNUSUAL SHORTNESS OF BREATH ?*UNUSUAL BRUISING OR BLEEDING ?*URINARY PROBLEMS (pain or burning when urinating, or frequent urination) ?*BOWEL PROBLEMS (unusual diarrhea, constipation, pain near the anus) ?TENDERNESS IN MOUTH AND THROAT WITH OR WITHOUT PRESENCE OF ULCERS (sore throat, sores in mouth, or a toothache) ?UNUSUAL RASH, SWELLING OR PAIN  ?UNUSUAL VAGINAL DISCHARGE OR ITCHING  ? ?Items with * indicate a potential emergency and should be followed up as soon as possible or go to the Emergency Department if any problems should occur. ? ?Please show the CHEMOTHERAPY ALERT CARD or  IMMUNOTHERAPY ALERT CARD at check-in to the Emergency Department and triage nurse. ? ?Should you have questions after your visit or need to cancel or reschedule your appointment, please contact [facility]  Dept: [phone]  and follow the prompts.  Office hours are 8:00 a.m. to 4:30 p.m. Monday - Friday. Please note that voicemails left after 4:00 p.m. may not be returned until the following business day.  We are closed weekends and major holidays. You have access to a nurse at all times for urgent questions. Please call the main number to the clinic Dept: [phone] and follow the prompts. ? ? ?For any non-urgent questions, you may also contact your provider using MyChart. We now offer e-Visits for anyone 9 and older to request care online for non-urgent symptoms. For details visit [email or web address]. ?  ?Also download the MyChart app! Go to the app store, search "MyChart", open the app, select [facility], and log in with your MyChart username and password. ? ?Due to Covid, a mask is required upon entering the hospital/clinic. If you do not have a mask, one will be given to you upon arrival. For doctor visits, patients may have 1 support person aged 65 or older with them. For treatment visits, patients cannot have anyone with them due to current Covid guidelines and our immunocompromised population.  ? ?

## 2022-03-28 NOTE — Progress Notes (Signed)
?[facility]   ?Telephone:(336) [phone] [phone]   ?Clinic Follow up Note  ? ?Patient Care Team: ?Rankins, Bill Salinas, MD as PCP - General (Family Medicine) ?Leonie Man, MD as PCP - Cardiology (Cardiology) ?Mauro Kaufmann, RN as Oncology Nurse Navigator ?Rockwell Germany, RN as Oncology Nurse Navigator ?Rolm Bookbinder, MD as Consulting Physician (General Surgery) ?Truitt Merle, MD as Consulting Physician (Hematology) ?Kyung Rudd, MD as Consulting Physician (Radiation Oncology) ? ?Date of Service:  03/28/2022 ? ?CHIEF COMPLAINT: f/u of right breast cancer ? ?CURRENT THERAPY:  ?Neoadjuvant TCHP, q21d, started 03/07/22 ? ?ASSESSMENT & PLAN:  ?Ashley Robertson is a 66 y.o. post-menopausal female with  ? ?1. Malignant neoplasm of overlapping sites of right breast, invasive lobular carcinoma, Stage IIIA, c(T3, N1), ER+/PR-/HER2+, Grade 2  ?-presented with new right breast mass. Right MM and [place] on 02/08/22 showed malignancy throughout right breast with discrete masses at 11 and 6 o'clock, and one borderline lymph node. Biopsy on 02/09/22 revealed invasive lobular carcinoma to both sites and metastatic carcinoma in lymph node. She has mass like hard tissue in most part of her right breast but no clinical concerns of inflammatory breast cancer, she also has a large palpable right axillary lymph node. ?-breast MRI on 03/01/22 showed: 8.5 cm area of confluent mass-like enhancement in right breast involving all 4 quadrants; biopsy-proven metastatic lymph node in right axilla; possible metastatic intramammary lymph node in posterior outer right breast; no evidence of malignancy on left. ?-staging CT CAP and bone scan on 03/05/22 were negative for metastatic disease. ?-baseline echo on 03/05/22 showed EF of 55-60%, normal. ?-she began neoadjuvant TCHP on 03/07/22. She tolerated very well with no major side effects. She only experienced mild constipation from the nausea medicine, otherwise she tolerated first  cycle chemo overy well. Labs reviewed, overall no concern.will proceed cycle 2 today  ?-she will return for cycle 3 in 3 weeks ? ?2. Genetics ?-she has a family history of breast cancer in a paternal aunt and a niece, liver cancer in her maternal grandfather, and prostate cancer in her father. ?-testing performed on 02/21/22 was negative, with a VUS in BRIP1 ?  ?3. Bone Health  ?-Her most recent DEXA was on 04/29/20 showing osteopenia (T-score of -2.1 at L1-3). ?  ?4. COPD and Smoking Cessation ?-she reports she quit smoking on 03/01/22. She notes she is currently using a non-nicotine vape, which has aided her in quitting. ?  ?5. HTN and prediabetes, anxiety ?-Continue medication, and follow-up with PCP ?-We will follow-up her blood pressure and blood glucose closely during her chemotherapy ?  ?  ?PLAN:  ?-proceed with cycle 2 TCHP today, with slightly increase carbo dose to AUC 5.5 ?-lab, flush, f/u, and TCHP in 3 and 6 weeks ? ? ?No problem-specific Assessment & Plan notes found for this encounter. ? ? ?SUMMARY OF ONCOLOGIC HISTORY: ?Oncology History Overview Note  ? Cancer Staging  ?Malignant neoplasm of overlapping sites of right breast in female, estrogen receptor positive ([place]) ?Staging form: Breast, AJCC 8th Edition ?- Clinical stage from 02/09/2022: Stage IIA (cT2, cN1, cM0, G2, ER+, PR-, HER2+) - Signed by Truitt Merle, MD on 02/20/2022 ? ?  ?Malignant neoplasm of overlapping sites of right breast in female, estrogen receptor positive ([place])  ?02/08/2022 Mammogram  ? CLINICAL DATA:  Acute onset right breast lump. ? ?EXAM: ?DIGITAL DIAGNOSTIC UNILATERAL RIGHT MAMMOGRAM WITH TOMOSYNTHESIS AND CAD; ULTRASOUND RIGHT BREAST LIMITED ? ?IMPRESSION: ?1. Highly suspicious findings mammographically and  sonographically. I suspect there is significant malignancy throughout most of the right breast. Discrete masses are seen at 11 o'clock and 6 o'clock.  ?A single borderline lymph node is identified. This lymph node appears  prominent compared to the remainder of the lymph nodes. The skin thickening suggests the possibility of inflammatory breast cancer. ?  ?02/09/2022 Cancer Staging  ? Staging form: Breast, AJCC 8th Edition ?- Clinical stage from 02/09/2022: Stage IIIA (cT3, cN1, cM0, G2, ER+, PR-, HER2+) - Signed by Truitt Merle, MD on 03/05/2022 ?Stage prefix: Initial diagnosis ?Histologic grading system: 3 grade system ? ?  ?02/09/2022 Initial Biopsy  ? Diagnosis ?1. Breast, right, needle core biopsy, right breast 11 o'clock mass ribbon clip ?- INVASIVE MAMMARY CARCINOMA ?- SEE COMMENT ?2. Breast, right, needle core biopsy, right breast 6'oclock mass, coil clip ?- INVASIVE MAMMARY CARCINOMA ?- SEE COMMENT ?3. Lymph node, needle/core biopsy, right axillary lymph node, tribell clip ?- METASTATIC CARCINOMA INVOLVING NODAL TISSUE ?Microscopic Comment ?1. and 2. The biopsy material shows an infiltrative proliferation of cells arranged linearly and in small clusters. Based ?on the biopsy, the carcinoma appears Nottingham grade 2 of 3 and measures 1.5 cm in greatest linear extent. ? ?Addendum parts 1 and 2: Immunohistochemistry for E-cadherin is negative consistent with lobular carcinoma. ? ?2. PROGNOSTIC INDICATORS ?Results: ?The tumor cells are EQUIVOCAL for Her2 (2+). Her2 by FISH will be performed and the results reported separately. ?Estrogen Receptor: 100%, POSITIVE, STRONG STAINING INTENSITY ?Progesterone Receptor: <1%, NEGATIVE ?Proliferation Marker Ki67: 10% ? ?2. FLUORESCENCE IN-SITU HYBRIDIZATION ?Results: ?GROUP 1: HER2 **POSITIVE** ? ? ?3. PROGNOSTIC INDICATORS ?Results: ?By immunohistochemistry, the tumor cells are POSITIVE for Her2 (3+). ?Estrogen Receptor: 100%, POSITIVE, STRONG STAINING INTENSITY ?Progesterone Receptor: <1%, NEGATIVE ?  ?02/16/2022 Initial Diagnosis  ? Malignant neoplasm of overlapping sites of right breast in female, estrogen receptor positive ([place]) ? ?  ?03/01/2022 Imaging  ? EXAM: ?BILATERAL BREAST MRI WITH AND  WITHOUT CONTRAST ? ?IMPRESSION: ?1. 8.5 x 8.3 x 5.9 cm area of confluent mass-like enhancement in the right breast involving all 4 quadrants and containing 2 biopsy ?marker clip artifacts, compatible with 4 quadrant biopsy-proven ?malignancy. ?2. Biopsy-proven metastatic lymph node in the right axilla. ?3. Possible metastatic intramammary lymph node in the posterior ?outer right breast just above the level of the nipple. ?4. No evidence of malignancy on the left. ?  ?03/01/2022 Genetic Testing  ? Negative hereditary cancer genetic testing: no pathogenic variants detected in [facility] CustomNext-Cancer +RNAinsight Panel.  Variant of uncertain significance reported in BRIP1 at p.F934V (c.2800T>G). Report date is March 01, 2022.   ? ?The CustomNext-Cancer+RNAinsight panel offered by [facility] includes sequencing and rearrangement analysis for the following 47 genes:  APC, ATM, AXIN2, BARD1, BMPR1A, BRCA1, BRCA2, BRIP1, CDH1, CDK4, CDKN2A, CHEK2, DICER1, EPCAM, GREM1, HOXB13, MEN1, MLH1, MSH2, MSH3, MSH6, MUTYH, NBN, NF1, NF2, NTHL1, PALB2, PMS2, POLD1, POLE, PTEN, RAD51C, RAD51D, RECQL, RET, SDHA, SDHAF2, SDHB, SDHC, SDHD, SMAD4, SMARCA4, STK11, TP53, TSC1, TSC2, and VHL.  RNA data is routinely analyzed for use in variant interpretation for all genes. ?  ?03/05/2022 Imaging  ? EXAM: ?CT CHEST, ABDOMEN, AND PELVIS WITH CONTRAST ? ?IMPRESSION: ?1. Asymmetric, masslike density of the glandular tissue of the right ?breast with overlying skin thickening, in keeping with known primary breast malignancy. ?2. Enlarged, ill-defined right axillary lymph node containing a ?biopsy marking clip, consistent with known nodal metastatic disease. ?3. No other evidence of lymphadenopathy or metastatic disease in the chest, abdomen, or pelvis. ?4. Emphysema. Background  of fine centrilobular nodularity, most ?concentrated in the lung apices, consistent with smoking-related ?respiratory bronchiolitis. ?5. Aneurysm of the infrarenal abdominal  aorta measuring up to 5.4 x 5.3 cm with a large burden of eccentric mural thrombus. Recommend follow-up CT/MR every 6 months and vascular consultation. This recommendation follows ACR consensus guidelines: W

## 2022-03-29 DIAGNOSIS — Z170 Estrogen receptor positive status [ER+]: Secondary | ICD-10-CM | POA: Diagnosis not present

## 2022-03-29 DIAGNOSIS — C50811 Malignant neoplasm of overlapping sites of right female breast: Secondary | ICD-10-CM | POA: Diagnosis not present

## 2022-03-30 ENCOUNTER — Inpatient Hospital Stay: Payer: PPO

## 2022-03-30 ENCOUNTER — Other Ambulatory Visit: Payer: Self-pay

## 2022-03-30 VITALS — BP 111/85 | HR 65 | Temp 97.9°F | Resp 18

## 2022-03-30 DIAGNOSIS — Z5112 Encounter for antineoplastic immunotherapy: Secondary | ICD-10-CM | POA: Diagnosis not present

## 2022-03-30 DIAGNOSIS — C50811 Malignant neoplasm of overlapping sites of right female breast: Secondary | ICD-10-CM

## 2022-03-30 MED ORDER — PEGFILGRASTIM-CBQV 6 MG/0.6ML ~~LOC~~ SOSY
6.0000 mg | PREFILLED_SYRINGE | Freq: Once | SUBCUTANEOUS | Status: AC
  Administered 2022-03-30: 6 mg via SUBCUTANEOUS
  Filled 2022-03-30: qty 0.6

## 2022-03-30 NOTE — Patient Instructions (Signed)

## 2022-04-04 ENCOUNTER — Telehealth: Payer: Self-pay | Admitting: Hematology

## 2022-04-04 NOTE — Telephone Encounter (Signed)
Rescheduled upcoming appointment due to provider's breast clinic. Patient is aware of changes. 

## 2022-04-10 ENCOUNTER — Other Ambulatory Visit (HOSPITAL_COMMUNITY): Payer: Self-pay

## 2022-04-10 ENCOUNTER — Ambulatory Visit: Payer: PPO

## 2022-04-10 DIAGNOSIS — R7303 Prediabetes: Secondary | ICD-10-CM | POA: Diagnosis not present

## 2022-04-10 DIAGNOSIS — F3341 Major depressive disorder, recurrent, in partial remission: Secondary | ICD-10-CM | POA: Diagnosis not present

## 2022-04-10 DIAGNOSIS — E7800 Pure hypercholesterolemia, unspecified: Secondary | ICD-10-CM | POA: Diagnosis not present

## 2022-04-10 DIAGNOSIS — I10 Essential (primary) hypertension: Secondary | ICD-10-CM | POA: Diagnosis not present

## 2022-04-10 DIAGNOSIS — C50919 Malignant neoplasm of unspecified site of unspecified female breast: Secondary | ICD-10-CM | POA: Diagnosis not present

## 2022-04-10 MED ORDER — FLUOXETINE HCL 20 MG PO CAPS
ORAL_CAPSULE | ORAL | 1 refills | Status: DC
  Filled 2022-04-10 – 2022-06-09 (×2): qty 90, 90d supply, fill #0
  Filled 2022-09-06: qty 90, 90d supply, fill #1

## 2022-04-16 MED FILL — Dexamethasone Sodium Phosphate Inj 100 MG/10ML: INTRAMUSCULAR | Qty: 1 | Status: AC

## 2022-04-16 MED FILL — Fosaprepitant Dimeglumine For IV Infusion 150 MG (Base Eq): INTRAVENOUS | Qty: 5 | Status: AC

## 2022-04-17 ENCOUNTER — Inpatient Hospital Stay: Payer: PPO

## 2022-04-17 ENCOUNTER — Encounter: Payer: Self-pay | Admitting: Hematology

## 2022-04-17 ENCOUNTER — Inpatient Hospital Stay: Payer: PPO | Attending: Hematology | Admitting: Hematology

## 2022-04-17 ENCOUNTER — Other Ambulatory Visit: Payer: Self-pay

## 2022-04-17 VITALS — BP 141/75 | HR 63 | Temp 98.2°F | Resp 18 | Ht 69.0 in | Wt 155.4 lb

## 2022-04-17 DIAGNOSIS — F419 Anxiety disorder, unspecified: Secondary | ICD-10-CM | POA: Diagnosis not present

## 2022-04-17 DIAGNOSIS — I10 Essential (primary) hypertension: Secondary | ICD-10-CM | POA: Diagnosis not present

## 2022-04-17 DIAGNOSIS — Z79899 Other long term (current) drug therapy: Secondary | ICD-10-CM | POA: Diagnosis not present

## 2022-04-17 DIAGNOSIS — Z5112 Encounter for antineoplastic immunotherapy: Secondary | ICD-10-CM | POA: Insufficient documentation

## 2022-04-17 DIAGNOSIS — M8580 Other specified disorders of bone density and structure, unspecified site: Secondary | ICD-10-CM | POA: Insufficient documentation

## 2022-04-17 DIAGNOSIS — Z87891 Personal history of nicotine dependence: Secondary | ICD-10-CM | POA: Diagnosis not present

## 2022-04-17 DIAGNOSIS — Z5189 Encounter for other specified aftercare: Secondary | ICD-10-CM | POA: Insufficient documentation

## 2022-04-17 DIAGNOSIS — C50811 Malignant neoplasm of overlapping sites of right female breast: Secondary | ICD-10-CM | POA: Diagnosis not present

## 2022-04-17 DIAGNOSIS — Z7982 Long term (current) use of aspirin: Secondary | ICD-10-CM | POA: Diagnosis not present

## 2022-04-17 DIAGNOSIS — Z170 Estrogen receptor positive status [ER+]: Secondary | ICD-10-CM | POA: Diagnosis not present

## 2022-04-17 DIAGNOSIS — Z5111 Encounter for antineoplastic chemotherapy: Secondary | ICD-10-CM | POA: Diagnosis not present

## 2022-04-17 DIAGNOSIS — J449 Chronic obstructive pulmonary disease, unspecified: Secondary | ICD-10-CM | POA: Insufficient documentation

## 2022-04-17 LAB — CMP (CANCER CENTER ONLY)
ALT: 20 U/L (ref 0–44)
AST: 18 U/L (ref 15–41)
Albumin: 3.6 g/dL (ref 3.5–5.0)
Alkaline Phosphatase: 90 U/L (ref 38–126)
Anion gap: 6 (ref 5–15)
BUN: 21 mg/dL (ref 8–23)
CO2: 27 mmol/L (ref 22–32)
Calcium: 8.9 mg/dL (ref 8.9–10.3)
Chloride: 104 mmol/L (ref 98–111)
Creatinine: 0.55 mg/dL (ref 0.44–1.00)
GFR, Estimated: >60 mL/min (ref >60)
Glucose, Bld: 138 mg/dL — ABNORMAL HIGH (ref 70–99)
Potassium: 4.5 mmol/L (ref 3.5–5.1)
Sodium: 137 mmol/L (ref 135–145)
Total Bilirubin: 0.3 mg/dL (ref 0.3–1.2)
Total Protein: 6.3 g/dL — ABNORMAL LOW (ref 6.5–8.1)

## 2022-04-17 LAB — CBC WITH DIFFERENTIAL (CANCER CENTER ONLY)
Abs Immature Granulocytes: 0.12 10*3/uL — ABNORMAL HIGH (ref 0.00–0.07)
Basophils Absolute: 0 10*3/uL (ref 0.0–0.1)
Basophils Relative: 0 %
Eosinophils Absolute: 0 10*3/uL (ref 0.0–0.5)
Eosinophils Relative: 0 %
HCT: 31.1 % — ABNORMAL LOW (ref 36.0–46.0)
Hemoglobin: 10.9 g/dL — ABNORMAL LOW (ref 12.0–15.0)
Immature Granulocytes: 1 %
Lymphocytes Relative: 7 %
Lymphs Abs: 0.8 10*3/uL (ref 0.7–4.0)
MCH: 34.4 pg — ABNORMAL HIGH (ref 26.0–34.0)
MCHC: 35 g/dL (ref 30.0–36.0)
MCV: 98.1 fL (ref 80.0–100.0)
Monocytes Absolute: 0.8 10*3/uL (ref 0.1–1.0)
Monocytes Relative: 7 %
Neutro Abs: 10.7 10*3/uL — ABNORMAL HIGH (ref 1.7–7.7)
Neutrophils Relative %: 85 %
Platelet Count: 285 10*3/uL (ref 150–400)
RBC: 3.17 MIL/uL — ABNORMAL LOW (ref 3.87–5.11)
RDW: 15.9 % — ABNORMAL HIGH (ref 11.5–15.5)
WBC Count: 12.5 10*3/uL — ABNORMAL HIGH (ref 4.0–10.5)
nRBC: 0 % (ref 0.0–0.2)

## 2022-04-17 MED ORDER — SODIUM CHLORIDE 0.9 % IV SOLN
448.2500 mg | Freq: Once | INTRAVENOUS | Status: AC
  Administered 2022-04-17: 450 mg via INTRAVENOUS
  Filled 2022-04-17: qty 45

## 2022-04-17 MED ORDER — TRASTUZUMAB-DKST CHEMO 150 MG IV SOLR
6.0000 mg/kg | Freq: Once | INTRAVENOUS | Status: AC
  Administered 2022-04-17: 378 mg via INTRAVENOUS
  Filled 2022-04-17: qty 18

## 2022-04-17 MED ORDER — PALONOSETRON HCL INJECTION 0.25 MG/5ML
0.2500 mg | Freq: Once | INTRAVENOUS | Status: AC
  Administered 2022-04-17: 0.25 mg via INTRAVENOUS
  Filled 2022-04-17: qty 5

## 2022-04-17 MED ORDER — DIPHENHYDRAMINE HCL 25 MG PO CAPS
50.0000 mg | ORAL_CAPSULE | Freq: Once | ORAL | Status: AC
  Administered 2022-04-17: 50 mg via ORAL
  Filled 2022-04-17: qty 2

## 2022-04-17 MED ORDER — SODIUM CHLORIDE 0.9 % IV SOLN
150.0000 mg | Freq: Once | INTRAVENOUS | Status: AC
  Administered 2022-04-17: 150 mg via INTRAVENOUS
  Filled 2022-04-17: qty 150

## 2022-04-17 MED ORDER — SODIUM CHLORIDE 0.9 % IV SOLN
10.0000 mg | Freq: Once | INTRAVENOUS | Status: AC
  Administered 2022-04-17: 10 mg via INTRAVENOUS
  Filled 2022-04-17: qty 10

## 2022-04-17 MED ORDER — HEPARIN SOD (PORK) LOCK FLUSH 100 UNIT/ML IV SOLN
500.0000 [IU] | Freq: Once | INTRAVENOUS | Status: AC | PRN
  Administered 2022-04-17: 500 [IU]

## 2022-04-17 MED ORDER — SODIUM CHLORIDE 0.9 % IV SOLN
420.0000 mg | Freq: Once | INTRAVENOUS | Status: AC
  Administered 2022-04-17: 420 mg via INTRAVENOUS
  Filled 2022-04-17: qty 14

## 2022-04-17 MED ORDER — SODIUM CHLORIDE 0.9 % IV SOLN
75.0000 mg/m2 | Freq: Once | INTRAVENOUS | Status: AC
  Administered 2022-04-17: 130 mg via INTRAVENOUS
  Filled 2022-04-17: qty 13

## 2022-04-17 MED ORDER — SODIUM CHLORIDE 0.9% FLUSH
10.0000 mL | INTRAVENOUS | Status: DC | PRN
  Administered 2022-04-17: 10 mL

## 2022-04-17 MED ORDER — SODIUM CHLORIDE 0.9 % IV SOLN: Freq: Once | INTRAVENOUS | Status: AC

## 2022-04-17 MED ORDER — ACETAMINOPHEN 325 MG PO TABS
650.0000 mg | ORAL_TABLET | Freq: Once | ORAL | Status: AC
  Administered 2022-04-17: 650 mg via ORAL
  Filled 2022-04-17: qty 2

## 2022-04-17 NOTE — Patient Instructions (Signed)
[facility]  Discharge Instructions: ?Thank you for choosing [facility] to provide your oncology and hematology care.  ? ?If you have a lab appointment with the [facility], please go directly to the [facility] and check in at the registration area. ?  ?Wear comfortable clothing and clothing appropriate for easy access to any Portacath or PICC line.  ? ?We strive to give you quality time with your provider. You may need to reschedule your appointment if you arrive late (15 or more minutes).  Arriving late affects you and other patients whose appointments are after yours.  Also, if you miss three or more appointments without notifying the office, you may be dismissed from the clinic at the provider?s discretion.    ?  ?For prescription refill requests, have your pharmacy contact our office and allow 72 hours for refills to be completed.   ? ?Today you received the following chemotherapy and/or immunotherapy agents: Trastuzumab, Pertuzumab, Carboplatin, Docetaxel.     ?  ?To help prevent nausea and vomiting after your treatment, we encourage you to take your nausea medication as directed. ? ?BELOW ARE SYMPTOMS THAT SHOULD BE REPORTED IMMEDIATELY: ?*FEVER GREATER THAN 100.4 F (38 ?C) OR HIGHER ?*CHILLS OR SWEATING ?*NAUSEA AND VOMITING THAT IS NOT CONTROLLED WITH YOUR NAUSEA MEDICATION ?*UNUSUAL SHORTNESS OF BREATH ?*UNUSUAL BRUISING OR BLEEDING ?*URINARY PROBLEMS (pain or burning when urinating, or frequent urination) ?*BOWEL PROBLEMS (unusual diarrhea, constipation, pain near the anus) ?TENDERNESS IN MOUTH AND THROAT WITH OR WITHOUT PRESENCE OF ULCERS (sore throat, sores in mouth, or a toothache) ?UNUSUAL RASH, SWELLING OR PAIN  ?UNUSUAL VAGINAL DISCHARGE OR ITCHING  ? ?Items with * indicate a potential emergency and should be followed up as soon as possible or go to the Emergency Department if any problems should occur. ? ?Please show the CHEMOTHERAPY ALERT CARD or  IMMUNOTHERAPY ALERT CARD at check-in to the Emergency Department and triage nurse. ? ?Should you have questions after your visit or need to cancel or reschedule your appointment, please contact [facility]  Dept: [phone]  and follow the prompts.  Office hours are 8:00 a.m. to 4:30 p.m. Monday - Friday. Please note that voicemails left after 4:00 p.m. may not be returned until the following business day.  We are closed weekends and major holidays. You have access to a nurse at all times for urgent questions. Please call the main number to the clinic Dept: [phone] and follow the prompts. ? ? ?For any non-urgent questions, you may also contact your provider using MyChart. We now offer e-Visits for anyone 71 and older to request care online for non-urgent symptoms. For details visit [email or web address]. ?  ?Also download the MyChart app! Go to the app store, search "MyChart", open the app, select [facility], and log in with your MyChart username and password. ? ?Due to Covid, a mask is required upon entering the hospital/clinic. If you do not have a mask, one will be given to you upon arrival. For doctor visits, patients may have 1 support person aged 45 or older with them. For treatment visits, patients cannot have anyone with them due to current Covid guidelines and our immunocompromised population.  ? ?

## 2022-04-17 NOTE — Progress Notes (Signed)
?[place]   ?Telephone:(336) [phone] [phone]   ?Clinic Follow up Note  ? ?Patient Care Team: ?Rankins, Bill Salinas, MD as PCP - General (Family Medicine) ?Leonie Man, MD as PCP - Cardiology (Cardiology) ?Mauro Kaufmann, RN as Oncology Nurse Navigator ?Rockwell Germany, RN as Oncology Nurse Navigator ?Rolm Bookbinder, MD as Consulting Physician (General Surgery) ?Truitt Merle, MD as Consulting Physician (Hematology) ?Kyung Rudd, MD as Consulting Physician (Radiation Oncology) ? ?Date of Service:  04/17/2022 ? ?CHIEF COMPLAINT: f/u of right breast cancer ? ?CURRENT THERAPY:  ?Neoadjuvant TCHP, q21d, started 03/07/22 ? ?ASSESSMENT & PLAN:  ?Ashley Robertson is a 66 y.o. post-menopausal female with  ? ?1. Malignant neoplasm of overlapping sites of right breast, invasive lobular carcinoma, Stage IIIA, c(T3, N1), ER+/PR-/HER2+, Grade 2  ?-presented with new right breast mass. Right MM and [place] on 02/08/22 showed malignancy throughout right breast with discrete masses at 11 and 6 o'clock, and one borderline lymph node. Biopsy on 02/09/22 revealed invasive lobular carcinoma to both sites and metastatic carcinoma in lymph node. She has mass like hard tissue in most part of her right breast but no clinical concerns of inflammatory breast cancer, she also has a large palpable right axillary lymph node. ?-breast MRI on 03/01/22 showed: 8.5 cm area of confluent mass-like enhancement in right breast involving all 4 quadrants; biopsy-proven metastatic lymph node in right axilla; possible metastatic intramammary lymph node in posterior outer right breast; no evidence of malignancy on left. ?-staging CT CAP and bone scan on 03/05/22 were negative for metastatic disease. ?-baseline echo on 03/05/22 showed EF of 55-60%, normal. ?-she began neoadjuvant TCHP on 03/07/22. She tolerates very well with no major side effects.  ?-Labs reviewed, slight anemia as expected on chemo, overall no concern. Physical exam shows  continued diffuse right breast firmness; we will plan for [place] to evaluate her response to chemo in 2-3 weeks. Will proceed cycle 3 today  ?-she will return for cycle 4 in 3 weeks ?  ?2. Genetics ?-she has a family history of breast cancer in a paternal aunt and a niece, liver cancer in her maternal grandfather, and prostate cancer in her father. ?-testing performed on 02/21/22 was negative, with a VUS in [facility] ?  ?3. Bone Health  ?-Her most recent DEXA was on 04/29/20 showing osteopenia (T-score of -2.1 at L1-3). ?  ?4. COPD and Smoking Cessation ?-she reports she quit smoking on 03/01/22. She notes she is currently using a non-nicotine vape, which has aided her in quitting. ?  ?5. HTN and prediabetes, anxiety ?-Continue medication, and follow-up with PCP ?-We will follow-up her blood pressure and blood glucose closely during her chemotherapy ?  ?  ?PLAN:  ?-proceed with cycle 3 TCHP today, [place] of right breast for response evaluation in 2-3 weeks  ?-lab, flush, f/u, and TCHP every 3 weeks as scheduled ? ? ?No problem-specific Assessment & Plan notes found for this encounter. ? ? ?SUMMARY OF ONCOLOGIC HISTORY: ?Oncology History Overview Note  ? Cancer Staging  ?Malignant neoplasm of overlapping sites of right breast in female, estrogen receptor positive ([place]) ?Staging form: Breast, AJCC 8th Edition ?- Clinical stage from 02/09/2022: Stage IIA (cT2, cN1, cM0, G2, ER+, PR-, HER2+) - Signed by Truitt Merle, MD on 02/20/2022 ? ?  ?Malignant neoplasm of overlapping sites of right breast in female, estrogen receptor positive ([place])  ?02/08/2022 Mammogram  ? CLINICAL DATA:  Acute onset right breast lump. ? ?EXAM: ?DIGITAL DIAGNOSTIC UNILATERAL RIGHT  MAMMOGRAM WITH TOMOSYNTHESIS AND CAD; ULTRASOUND RIGHT BREAST LIMITED ? ?IMPRESSION: ?1. Highly suspicious findings mammographically and sonographically. I suspect there is significant malignancy throughout most of the right breast. Discrete masses are seen at 11 o'clock and 6 o'clock.  ?A  single borderline lymph node is identified. This lymph node appears prominent compared to the remainder of the lymph nodes. The skin thickening suggests the possibility of inflammatory breast cancer. ?  ?02/09/2022 Cancer Staging  ? Staging form: Breast, AJCC 8th Edition ?- Clinical stage from 02/09/2022: Stage IIIA (cT3, cN1, cM0, G2, ER+, PR-, HER2+) - Signed by Truitt Merle, MD on 03/05/2022 ?Stage prefix: Initial diagnosis ?Histologic grading system: 3 grade system ? ?  ?02/09/2022 Initial Biopsy  ? Diagnosis ?1. Breast, right, needle core biopsy, right breast 11 o'clock mass ribbon clip ?- INVASIVE MAMMARY CARCINOMA ?- SEE COMMENT ?2. Breast, right, needle core biopsy, right breast 6'oclock mass, coil clip ?- INVASIVE MAMMARY CARCINOMA ?- SEE COMMENT ?3. Lymph node, needle/core biopsy, right axillary lymph node, tribell clip ?- METASTATIC CARCINOMA INVOLVING NODAL TISSUE ?Microscopic Comment ?1. and 2. The biopsy material shows an infiltrative proliferation of cells arranged linearly and in small clusters. Based ?on the biopsy, the carcinoma appears Nottingham grade 2 of 3 and measures 1.5 cm in greatest linear extent. ? ?Addendum parts 1 and 2: Immunohistochemistry for E-cadherin is negative consistent with lobular carcinoma. ? ?2. PROGNOSTIC INDICATORS ?Results: ?The tumor cells are EQUIVOCAL for Her2 (2+). Her2 by FISH will be performed and the results reported separately. ?Estrogen Receptor: 100%, POSITIVE, STRONG STAINING INTENSITY ?Progesterone Receptor: <1%, NEGATIVE ?Proliferation Marker Ki67: 10% ? ?2. FLUORESCENCE IN-SITU HYBRIDIZATION ?Results: ?GROUP 1: HER2 **POSITIVE** ? ? ?3. PROGNOSTIC INDICATORS ?Results: ?By immunohistochemistry, the tumor cells are POSITIVE for Her2 (3+). ?Estrogen Receptor: 100%, POSITIVE, STRONG STAINING INTENSITY ?Progesterone Receptor: <1%, NEGATIVE ?  ?02/16/2022 Initial Diagnosis  ? Malignant neoplasm of overlapping sites of right breast in female, estrogen receptor positive  ([place]) ? ?  ?03/01/2022 Imaging  ? EXAM: ?BILATERAL BREAST MRI WITH AND WITHOUT CONTRAST ? ?IMPRESSION: ?1. 8.5 x 8.3 x 5.9 cm area of confluent mass-like enhancement in the right breast involving all 4 quadrants and containing 2 biopsy ?marker clip artifacts, compatible with 4 quadrant biopsy-proven ?malignancy. ?2. Biopsy-proven metastatic lymph node in the right axilla. ?3. Possible metastatic intramammary lymph node in the posterior ?outer right breast just above the level of the nipple. ?4. No evidence of malignancy on the left. ?  ?03/01/2022 Genetic Testing  ? Negative hereditary cancer genetic testing: no pathogenic variants detected in [facility] CustomNext-Cancer +RNAinsight Panel.  Variant of uncertain significance reported in BR[facility]1 at p.F934V (c.2800T>G). Report date is March 01, 2022.   ? ?The CustomNext-Cancer+RNAinsight panel offered by [facility] includes sequencing and rearrangement analysis for the following 47 genes:  APC, ATM, AXIN2, BARD1, BMPR1A, BRCA1, BRCA2, BRIP1, CDH1, CDK4, CDKN2A, CHEK2, DICER1, EPCAM, GREM1, HOXB13, MEN1, MLH1, MSH2, MSH3, MSH6, MUTYH, NBN, NF1, NF2, NTHL1, PALB2, PMS2, POLD1, POLE, PTEN, RAD51C, RAD51D, RECQL, RET, SDHA, SDHAF2, SDHB, SDHC, SDHD, SMAD4, SMARCA4, STK11, TP53, TSC1, TSC2, and VHL.  RNA data is routinely analyzed for use in variant interpretation for all genes. ?  ?03/05/2022 Imaging  ? EXAM: ?CT CHEST, ABDOMEN, AND PELVIS WITH CONTRAST ? ?IMPRESSION: ?1. Asymmetric, masslike density of the glandular tissue of the right ?breast with overlying skin thickening, in keeping with known primary breast malignancy. ?2. Enlarged, ill-defined right axillary lymph node containing a ?biopsy marking clip, consistent with known nodal metastatic disease. ?3.  No other evidence of lymphadenopathy or metastatic disease in the chest, abdomen, or pelvis. ?4. Emphysema. Background of fine centrilobular nodularity, most ?concentrated in the lung apices, consistent with  smoking-related ?respiratory bronchiolitis. ?5. Aneurysm of the infrarenal abdominal aorta measuring up to 5.4 x 5.3 cm with a large burden of eccentric mural thrombus. Recommend follow-up CT/MR every 6 months and vascu

## 2022-04-18 ENCOUNTER — Inpatient Hospital Stay: Payer: PPO | Admitting: Hematology

## 2022-04-18 ENCOUNTER — Inpatient Hospital Stay: Payer: PPO

## 2022-04-19 ENCOUNTER — Inpatient Hospital Stay: Payer: PPO

## 2022-04-19 ENCOUNTER — Other Ambulatory Visit: Payer: Self-pay

## 2022-04-19 VITALS — BP 136/65 | HR 55 | Temp 97.7°F | Resp 18

## 2022-04-19 DIAGNOSIS — Z170 Estrogen receptor positive status [ER+]: Secondary | ICD-10-CM

## 2022-04-19 DIAGNOSIS — Z5112 Encounter for antineoplastic immunotherapy: Secondary | ICD-10-CM | POA: Diagnosis not present

## 2022-04-19 MED ORDER — PEGFILGRASTIM-CBQV 6 MG/0.6ML ~~LOC~~ SOSY
6.0000 mg | PREFILLED_SYRINGE | Freq: Once | SUBCUTANEOUS | Status: AC
  Administered 2022-04-19: 6 mg via SUBCUTANEOUS
  Filled 2022-04-19: qty 0.6

## 2022-04-20 ENCOUNTER — Inpatient Hospital Stay: Payer: PPO

## 2022-04-24 ENCOUNTER — Other Ambulatory Visit (HOSPITAL_COMMUNITY): Payer: Self-pay

## 2022-04-24 ENCOUNTER — Other Ambulatory Visit: Payer: Self-pay | Admitting: Cardiology

## 2022-04-24 MED ORDER — AMLODIPINE BESYLATE 10 MG PO TABS
ORAL_TABLET | Freq: Every day | ORAL | 0 refills | Status: DC
  Filled 2022-04-24: qty 30, 30d supply, fill #0

## 2022-04-25 ENCOUNTER — Other Ambulatory Visit (HOSPITAL_COMMUNITY): Payer: Self-pay

## 2022-05-08 MED FILL — Fosaprepitant Dimeglumine For IV Infusion 150 MG (Base Eq): INTRAVENOUS | Qty: 5 | Status: AC

## 2022-05-08 MED FILL — Dexamethasone Sodium Phosphate Inj 100 MG/10ML: INTRAMUSCULAR | Qty: 1 | Status: AC

## 2022-05-09 ENCOUNTER — Encounter: Payer: Self-pay | Admitting: *Deleted

## 2022-05-09 ENCOUNTER — Other Ambulatory Visit: Payer: Self-pay

## 2022-05-09 ENCOUNTER — Inpatient Hospital Stay: Payer: PPO | Attending: Hematology | Admitting: Physician Assistant

## 2022-05-09 ENCOUNTER — Inpatient Hospital Stay: Payer: PPO

## 2022-05-09 VITALS — BP 151/76 | HR 69 | Temp 97.6°F | Resp 18 | Ht 69.0 in | Wt 155.3 lb

## 2022-05-09 DIAGNOSIS — C773 Secondary and unspecified malignant neoplasm of axilla and upper limb lymph nodes: Secondary | ICD-10-CM | POA: Diagnosis not present

## 2022-05-09 DIAGNOSIS — Z79899 Other long term (current) drug therapy: Secondary | ICD-10-CM | POA: Diagnosis not present

## 2022-05-09 DIAGNOSIS — Z5112 Encounter for antineoplastic immunotherapy: Secondary | ICD-10-CM | POA: Diagnosis not present

## 2022-05-09 DIAGNOSIS — F419 Anxiety disorder, unspecified: Secondary | ICD-10-CM | POA: Insufficient documentation

## 2022-05-09 DIAGNOSIS — Z87891 Personal history of nicotine dependence: Secondary | ICD-10-CM | POA: Diagnosis not present

## 2022-05-09 DIAGNOSIS — Z95828 Presence of other vascular implants and grafts: Secondary | ICD-10-CM

## 2022-05-09 DIAGNOSIS — Z5189 Encounter for other specified aftercare: Secondary | ICD-10-CM | POA: Insufficient documentation

## 2022-05-09 DIAGNOSIS — Z170 Estrogen receptor positive status [ER+]: Secondary | ICD-10-CM | POA: Diagnosis not present

## 2022-05-09 DIAGNOSIS — I10 Essential (primary) hypertension: Secondary | ICD-10-CM | POA: Insufficient documentation

## 2022-05-09 DIAGNOSIS — Z7982 Long term (current) use of aspirin: Secondary | ICD-10-CM | POA: Diagnosis not present

## 2022-05-09 DIAGNOSIS — C50811 Malignant neoplasm of overlapping sites of right female breast: Secondary | ICD-10-CM | POA: Diagnosis not present

## 2022-05-09 DIAGNOSIS — Z5111 Encounter for antineoplastic chemotherapy: Secondary | ICD-10-CM | POA: Insufficient documentation

## 2022-05-09 DIAGNOSIS — Z7952 Long term (current) use of systemic steroids: Secondary | ICD-10-CM | POA: Diagnosis not present

## 2022-05-09 DIAGNOSIS — M8580 Other specified disorders of bone density and structure, unspecified site: Secondary | ICD-10-CM | POA: Diagnosis not present

## 2022-05-09 DIAGNOSIS — R7303 Prediabetes: Secondary | ICD-10-CM | POA: Diagnosis not present

## 2022-05-09 LAB — CBC WITH DIFFERENTIAL (CANCER CENTER ONLY)
Abs Immature Granulocytes: 0.1 10*3/uL — ABNORMAL HIGH (ref 0.00–0.07)
Basophils Absolute: 0 10*3/uL (ref 0.0–0.1)
Basophils Relative: 0 %
Eosinophils Absolute: 0 10*3/uL (ref 0.0–0.5)
Eosinophils Relative: 0 %
HCT: 32.3 % — ABNORMAL LOW (ref 36.0–46.0)
Hemoglobin: 11 g/dL — ABNORMAL LOW (ref 12.0–15.0)
Immature Granulocytes: 1 %
Lymphocytes Relative: 8 %
Lymphs Abs: 1 10*3/uL (ref 0.7–4.0)
MCH: 34.7 pg — ABNORMAL HIGH (ref 26.0–34.0)
MCHC: 34.1 g/dL (ref 30.0–36.0)
MCV: 101.9 fL — ABNORMAL HIGH (ref 80.0–100.0)
Monocytes Absolute: 0.9 10*3/uL (ref 0.1–1.0)
Monocytes Relative: 7 %
Neutro Abs: 10.6 10*3/uL — ABNORMAL HIGH (ref 1.7–7.7)
Neutrophils Relative %: 84 %
Platelet Count: 283 10*3/uL (ref 150–400)
RBC: 3.17 MIL/uL — ABNORMAL LOW (ref 3.87–5.11)
RDW: 17.1 % — ABNORMAL HIGH (ref 11.5–15.5)
WBC Count: 12.5 10*3/uL — ABNORMAL HIGH (ref 4.0–10.5)
nRBC: 0 % (ref 0.0–0.2)

## 2022-05-09 LAB — CMP (CANCER CENTER ONLY)
ALT: 12 U/L (ref 0–44)
AST: 14 U/L — ABNORMAL LOW (ref 15–41)
Albumin: 4 g/dL (ref 3.5–5.0)
Alkaline Phosphatase: 87 U/L (ref 38–126)
Anion gap: 6 (ref 5–15)
BUN: 14 mg/dL (ref 8–23)
CO2: 28 mmol/L (ref 22–32)
Calcium: 9.5 mg/dL (ref 8.9–10.3)
Chloride: 104 mmol/L (ref 98–111)
Creatinine: 0.49 mg/dL (ref 0.44–1.00)
GFR, Estimated: >60 mL/min (ref >60)
Glucose, Bld: 129 mg/dL — ABNORMAL HIGH (ref 70–99)
Potassium: 4.2 mmol/L (ref 3.5–5.1)
Sodium: 138 mmol/L (ref 135–145)
Total Bilirubin: 0.3 mg/dL (ref 0.3–1.2)
Total Protein: 6.6 g/dL (ref 6.5–8.1)

## 2022-05-09 MED ORDER — SODIUM CHLORIDE 0.9 % IV SOLN: Freq: Once | INTRAVENOUS | Status: AC

## 2022-05-09 MED ORDER — TRASTUZUMAB-DKST CHEMO 150 MG IV SOLR
6.0000 mg/kg | Freq: Once | INTRAVENOUS | Status: AC
  Administered 2022-05-09: 378 mg via INTRAVENOUS
  Filled 2022-05-09: qty 18

## 2022-05-09 MED ORDER — SODIUM CHLORIDE 0.9 % IV SOLN
420.0000 mg | Freq: Once | INTRAVENOUS | Status: AC
  Administered 2022-05-09: 420 mg via INTRAVENOUS
  Filled 2022-05-09: qty 14

## 2022-05-09 MED ORDER — ACETAMINOPHEN 325 MG PO TABS
650.0000 mg | ORAL_TABLET | Freq: Once | ORAL | Status: AC
  Administered 2022-05-09: 650 mg via ORAL
  Filled 2022-05-09: qty 2

## 2022-05-09 MED ORDER — DIPHENHYDRAMINE HCL 25 MG PO CAPS
50.0000 mg | ORAL_CAPSULE | Freq: Once | ORAL | Status: AC
  Administered 2022-05-09: 50 mg via ORAL
  Filled 2022-05-09: qty 2

## 2022-05-09 MED ORDER — PALONOSETRON HCL INJECTION 0.25 MG/5ML
0.2500 mg | Freq: Once | INTRAVENOUS | Status: AC
  Administered 2022-05-09: 0.25 mg via INTRAVENOUS
  Filled 2022-05-09: qty 5

## 2022-05-09 MED ORDER — SODIUM CHLORIDE 0.9 % IV SOLN
75.0000 mg/m2 | Freq: Once | INTRAVENOUS | Status: AC
  Administered 2022-05-09: 130 mg via INTRAVENOUS
  Filled 2022-05-09: qty 13

## 2022-05-09 MED ORDER — SODIUM CHLORIDE 0.9% FLUSH
10.0000 mL | INTRAVENOUS | Status: DC | PRN
  Administered 2022-05-09: 10 mL

## 2022-05-09 MED ORDER — SODIUM CHLORIDE 0.9% FLUSH
10.0000 mL | Freq: Once | INTRAVENOUS | Status: AC
  Administered 2022-05-09: 10 mL

## 2022-05-09 MED ORDER — SODIUM CHLORIDE 0.9 % IV SOLN
150.0000 mg | Freq: Once | INTRAVENOUS | Status: AC
  Administered 2022-05-09: 150 mg via INTRAVENOUS
  Filled 2022-05-09: qty 150

## 2022-05-09 MED ORDER — SODIUM CHLORIDE 0.9 % IV SOLN
448.2500 mg | Freq: Once | INTRAVENOUS | Status: AC
  Administered 2022-05-09: 450 mg via INTRAVENOUS
  Filled 2022-05-09: qty 45

## 2022-05-09 MED ORDER — HEPARIN SOD (PORK) LOCK FLUSH 100 UNIT/ML IV SOLN
500.0000 [IU] | Freq: Once | INTRAVENOUS | Status: AC | PRN
  Administered 2022-05-09: 500 [IU]

## 2022-05-09 MED ORDER — SODIUM CHLORIDE 0.9 % IV SOLN
10.0000 mg | Freq: Once | INTRAVENOUS | Status: AC
  Administered 2022-05-09: 10 mg via INTRAVENOUS
  Filled 2022-05-09: qty 10

## 2022-05-09 NOTE — Progress Notes (Signed)
[name]   Telephone:(336) [phone] Fax:(336) [phone]   Clinic Follow up Note   Patient Care Team: Rankins, Bill Salinas, MD as PCP - General (Family Medicine) Leonie Man, MD as PCP - Cardiology (Cardiology) Mauro Kaufmann, RN as Oncology Nurse Navigator Rockwell Germany, RN as Oncology Nurse Navigator Rolm Bookbinder, MD as Consulting Physician (General Surgery) Truitt Merle, MD as Consulting Physician (Hematology) Kyung Rudd, MD as Consulting Physician (Radiation Oncology)  Date of Service:  05/09/2022  CHIEF COMPLAINT: f/u of right breast cancer  CURRENT THERAPY:  Neoadjuvant TCHP, q21d, started 03/07/22   SUMMARY OF ONCOLOGIC HISTORY: Oncology History Overview Note   Cancer Staging  Malignant neoplasm of overlapping sites of right breast in female, estrogen receptor positive ([place]) Staging form: Breast, AJCC 8th Edition - Clinical stage from 02/09/2022: Stage IIA (cT2, cN1, cM0, G2, ER+, PR-, HER2+) - Signed by Truitt Merle, MD on 02/20/2022    Malignant neoplasm of overlapping sites of right breast in female, estrogen receptor positive ([place])  02/08/2022 Mammogram   CLINICAL DATA:  Acute onset right breast lump.  EXAM: DIGITAL DIAGNOSTIC UNILATERAL RIGHT MAMMOGRAM WITH TOMOSYNTHESIS AND CAD; ULTRASOUND RIGHT BREAST LIMITED  IMPRESSION: 1. Highly suspicious findings mammographically and sonographically. I suspect there is significant malignancy throughout most of the right breast. Discrete masses are seen at 11 o'clock and 6 o'clock.  A single borderline lymph node is identified. This lymph node appears prominent compared to the remainder of the lymph nodes. The skin thickening suggests the possibility of inflammatory breast cancer.   02/09/2022 Cancer Staging   Staging form: Breast, AJCC 8th Edition - Clinical stage from 02/09/2022: Stage IIIA (cT3, cN1, cM0, G2, ER+, PR-, HER2+) - Signed by Truitt Merle, MD on 03/05/2022 Stage prefix: Initial  diagnosis Histologic grading system: 3 grade system    02/09/2022 Initial Biopsy   Diagnosis 1. Breast, right, needle core biopsy, right breast 11 o'clock mass ribbon clip - INVASIVE MAMMARY CARCINOMA - SEE COMMENT 2. Breast, right, needle core biopsy, right breast 6'oclock mass, coil clip - INVASIVE MAMMARY CARCINOMA - SEE COMMENT 3. Lymph node, needle/core biopsy, right axillary lymph node, tribell clip - METASTATIC CARCINOMA INVOLVING NODAL TISSUE Microscopic Comment 1. and 2. The biopsy material shows an infiltrative proliferation of cells arranged linearly and in small clusters. Based on the biopsy, the carcinoma appears Nottingham grade 2 of 3 and measures 1.5 cm in greatest linear extent.  Addendum parts 1 and 2: Immunohistochemistry for E-cadherin is negative consistent with lobular carcinoma.  2. PROGNOSTIC INDICATORS Results: The tumor cells are EQUIVOCAL for Her2 (2+). Her2 by FISH will be performed and the results reported separately. Estrogen Receptor: 100%, POSITIVE, STRONG STAINING INTENSITY Progesterone Receptor: <1%, NEGATIVE Proliferation Marker Ki67: 10%  2. FLUORESCENCE IN-SITU HYBRIDIZATION Results: GROUP 1: HER2 **POSITIVE**   3. PROGNOSTIC INDICATORS Results: By immunohistochemistry, the tumor cells are POSITIVE for Her2 (3+). Estrogen Receptor: 100%, POSITIVE, STRONG STAINING INTENSITY Progesterone Receptor: <1%, NEGATIVE   02/16/2022 Initial Diagnosis   Malignant neoplasm of overlapping sites of right breast in female, estrogen receptor positive ([place])    03/01/2022 Imaging   EXAM: BILATERAL BREAST MRI WITH AND WITHOUT CONTRAST  IMPRESSION: 1. 8.5 x 8.3 x 5.9 cm area of confluent mass-like enhancement in the right breast involving all 4 quadrants and containing 2 biopsy marker clip artifacts, compatible with 4 quadrant biopsy-proven malignancy. 2. Biopsy-proven metastatic lymph node in the right axilla. 3. Possible metastatic intramammary lymph  node in the posterior outer right  breast just above the level of the nipple. 4. No evidence of malignancy on the left.   03/01/2022 Genetic Testing   Negative hereditary cancer genetic testing: no pathogenic variants detected in [facility] CustomNext-Cancer +RNAinsight Panel.  Variant of uncertain significance reported in BRIP1 at p.F934V (c.2800T>G). Report date is March 01, 2022.    The CustomNext-Cancer+RNAinsight panel offered by [facility] includes sequencing and rearrangement analysis for the following 47 genes:  APC, ATM, AXIN2, BARD1, BMPR1A, BRCA1, BRCA2, BRIP1, CDH1, CDK4, CDKN2A, CHEK2, DICER1, EPCAM, GREM1, HOXB13, MEN1, MLH1, MSH2, MSH3, MSH6, MUTYH, NBN, NF1, NF2, NTHL1, PALB2, PMS2, POLD1, POLE, PTEN, RAD51C, RAD51D, RECQL, RET, SDHA, SDHAF2, SDHB, SDHC, SDHD, SMAD4, SMARCA4, STK11, TP53, TSC1, TSC2, and VHL.  RNA data is routinely analyzed for use in variant interpretation for all genes.   03/05/2022 Imaging   EXAM: CT CHEST, ABDOMEN, AND PELVIS WITH CONTRAST  IMPRESSION: 1. Asymmetric, masslike density of the glandular tissue of the right breast with overlying skin thickening, in keeping with known primary breast malignancy. 2. Enlarged, ill-defined right axillary lymph node containing a biopsy marking clip, consistent with known nodal metastatic disease. 3. No other evidence of lymphadenopathy or metastatic disease in the chest, abdomen, or pelvis. 4. Emphysema. Background of fine centrilobular nodularity, most concentrated in the lung apices, consistent with smoking-related respiratory bronchiolitis. 5. Aneurysm of the infrarenal abdominal aorta measuring up to 5.4 x 5.3 cm with a large burden of eccentric mural thrombus. Recommend follow-up CT/MR every 6 months and vascular consultation. This recommendation follows ACR consensus guidelines: White Paper of the ACR Incidental Findings Committee II on Vascular Findings. J Am Coll Radiol 2013; 10:789-794. 6. Coronary artery  disease.   03/05/2022 Imaging   EXAM: NUCLEAR MEDICINE WHOLE BODY BONE SCAN  IMPRESSION: No evidence of bony metastatic disease.   03/07/2022 -  Chemotherapy   Patient is on Treatment Plan : BREAST  Docetaxel + Carboplatin + Trastuzumab + Pertuzumab  (TCHP) q21d          INTERVAL HISTORY:  JENIYAH MENOR is here for a follow up of breast cancer. She was last seen by Dr. Burr Medico on 04/17/2022. She presents to the clinic alone.   She reports that she is feeling well without any new symptoms.  She continues to tolerate the chemotherapy with minimal side effects.  She experiences fatigue for a couple of days last week but otherwise continues to stay active.  She has a good appetite and denies any weight loss.  She denies any nausea, vomiting or abdominal pain.  Her bowel habits are unchanged without any diarrhea or constipation.  She denies easy bruising or signs of active bleeding.  She has noticed that firmness in her right breast has increased and diffusely present.  She denies any breast pain.  She denies any fevers, chills, night sweats, shortness of breath, chest pain, cough, neuropathy or edema.  She has no other complaints.   All other systems were reviewed with the patient and are negative.  MEDICAL HISTORY:  Past Medical History:  Diagnosis Date   Allergy    Lisinopril and lipitor   Anxiety and depression    Aortic atherosclerosis ([place]) 03/2020   CT Chest: 2 V (LAD & LCx) Coronary Atherosclerosis, Aortic Atherosclerosis (no aneurysm).  Mild centrilobular emphysema with mild diffuse bronchial thickening; several scattered small solitary pulmonary nodules (largest 5.6 mm in anterior left upper lobe)   Breast cancer ([place]) 12/2021   Right breast ILC   Carotid artery plaque, bilateral 10/2015  Mild to moderate plaque L>R without significant stenosis   COPD (chronic obstructive pulmonary disease) ([facility])    Coronary Artery Calcification - Score 79    Coronary Calcium Score 79.  LAD and  circumflex calcification noted.  Normal ascending aorta with mild calcification.   Current every day smoker    Emphysema lung ([place])    Noted on chest CT   Family history of breast cancer 02/21/2022   Family history of prostate cancer 02/21/2022   Hyperlipidemia    Hypertension    Controlled with amlodipine   Prediabetes    Skin cancer 2021   Remove 2022    SURGICAL HISTORY: Past Surgical History:  Procedure Laterality Date   CESAREAN SECTION  1990, [place] Left 03/06/2022   Procedure: INSERTION PORT-A-CATH;  Surgeon: Rolm Bookbinder, MD;  Location: [facility];  Service: General;  Laterality: Left;   [place] ECHOCARDIOGRAM  10/2016   EF 55 to 60%.  Normal systolic and diastolic function.  No ASD/PFO   TUBAL LIGATION  1991    I have reviewed the social history and family history with the patient and they are unchanged from previous note.  ALLERGIES:  is allergic to lisinopril and lipitor [atorvastatin].  MEDICATIONS:  Current Outpatient Medications  Medication Sig Dispense Refill   albuterol (PROVENTIL HFA) 108 (90 Base) MCG/ACT inhaler Inhale 1 to 2 puffs into the lungs every 6 hours as needed 8.5 g 2   amLODipine (NORVASC) 10 MG tablet TAKE 1 TABLET BY MOUTH AT BEDTIME 30 tablet 0   aspirin 81 MG tablet Take 81 mg by mouth daily.     Calcium Carb-Cholecalciferol (CALCIUM 500 + D3 PO) Take 1 tablet by mouth daily.     Cholecalciferol (VITAMIN D) 50 MCG (2000 UT) CAPS Take by mouth.     dexamethasone (DECADRON) 4 MG tablet Take one tablet twice daily the day before Taxotere, then take 1 tablet daily for 3 days after chemotherapy. 30 tablet 0   FLUoxetine (PROZAC) 20 MG capsule Take 1 capsule by mouth daily 90 capsule 1   FLUoxetine (PROZAC) 20 MG capsule Take 1 capsule by mouth daily. 90 capsule 1   Fluticasone-Umeclidin-Vilant (TRELEGY ELLIPTA)  200-62.5-25 MCG/ACT AEPB Inhale 1 puff into the lungs daily 60 each 2   lidocaine-prilocaine (EMLA) cream Apply to affected area once 30 g 3   ondansetron (ZOFRAN) 8 MG tablet Take 1 tablet by mouth 2 (two) times daily as needed for nausea or vomiting. Start on the third day after chemotherapy. 30 tablet 1   OVER THE COUNTER MEDICATION Serrapeptase 120,000 units dailyReported on 12/27/2015     prochlorperazine (COMPAZINE) 10 MG tablet Take 1 tablet (10 mg total) by mouth every 6 (six) hours as needed for nausea or vomiting. 30 tablet 1   rosuvastatin (CRESTOR) 5 MG tablet Take 1 tablet by mouth daily at bedtime ,except on Monday, Wednesday and Fridays take 2 tablets at bedtime as directed 180 tablet 3   Specialty Vitamins Products (MAGNESIUM, AMINO ACID CHELATE,) 133 MG tablet Take 1 tablet by mouth at bedtime. PURE magnesium supplement - 120 mg of mag per capsule     traMADol (ULTRAM) 50 MG tablet Take 1 tablet (50 mg total) by mouth every 6 (six) hours as needed. 10 tablet 0   No current facility-administered medications for this visit.    PHYSICAL EXAMINATION: ECOG  PERFORMANCE STATUS: 0 - Asymptomatic  Vitals:   05/09/22 1012  BP: (!) 151/76  Pulse: 69  Resp: 18  Temp: 97.6 F (36.4 C)  SpO2: 95%   Wt Readings from Last 3 Encounters:  05/09/22 155 lb 4.8 oz (70.4 kg)  04/17/22 155 lb 6.4 oz (70.5 kg)  03/28/22 148 lb 6 oz (67.3 kg)     GENERAL:alert, no distress and comfortable SKIN: skin color, texture, turgor are normal, no rashes or significant lesions EYES: normal, Conjunctiva are pink and non-injected, sclera clear  LYMPH:  no palpable lymphadenopathy in the cervical, axillary LUNGS: clear to auscultation and percussion with normal breathing effort HEART: regular rate & rhythm and no murmurs and no lower extremity edema Musculoskeletal:no cyanosis of digits and no clubbing  NEURO: alert & oriented x 3 with fluent speech, no focal motor/sensory deficits BREAST: (+) diffuse  firmness in right breast, nontender, retracted nipple  LABORATORY DATA:  I have reviewed the data as listed    Latest Ref Rng & Units 05/09/2022    9:52 AM 04/17/2022    8:27 AM 03/28/2022    7:59 AM  CBC  WBC 4.0 - 10.5 K/uL 12.5   12.5   13.1    Hemoglobin 12.0 - 15.0 g/dL 11.0   10.9   11.5    Hematocrit 36.0 - 46.0 % 32.3   31.1   33.6    Platelets 150 - 400 K/uL 283   285   485          Latest Ref Rng & Units 04/17/2022    8:27 AM 03/28/2022    7:59 AM 03/14/2022   10:48 AM  CMP  Glucose 70 - 99 mg/dL 138   141   106    BUN 8 - 23 mg/dL _0 Creatinine 0.44 - 1.00 mg/dL 0.55   0.51   0.64    Sodium 135 - 145 mmol/L 137   137   132    Potassium 3.5 - 5.1 mmol/L 4.5   4.2   4.2    Chloride 98 - 111 mmol/L 104   104   99    CO2 22 - 32 mmol/L _1 Calcium 8.9 - 10.3 mg/dL 8.9   8.8   9.2    Total Protein 6.5 - 8.1 g/dL 6.3   6.3   6.6    Total Bilirubin 0.3 - 1.2 mg/dL 0.3   0.2   0.4    Alkaline Phos 38 - 126 U/L 90   83   80    AST 15 - 41 U/L _2 ALT 0 - 44 U/L _3 RADIOGRAPHIC STUDIES: I have personally reviewed the radiological images as listed and agreed with the findings in the report. No results found.    ASSESSMENT & PLAN:  Ashley Robertson is a 66 y.o. post-menopausal female with   1. Malignant neoplasm of overlapping sites of right breast, invasive lobular carcinoma, Stage IIIA, c(T3, N1), ER+/PR-/HER2+, Grade 2  -presented with new right breast mass. Right MM and [place] on 02/08/22 showed malignancy throughout right breast with discrete masses at 11 and 6 o'clock, and one borderline lymph node. Biopsy on 02/09/22 revealed invasive lobular carcinoma to both sites and metastatic carcinoma in lymph node. She has mass  like hard tissue in most part of her right breast but no clinical concerns of inflammatory breast cancer, she also has a large palpable right axillary lymph node. -breast MRI on 03/01/22 showed: 8.5 cm area of  confluent mass-like enhancement in right breast involving all 4 quadrants; biopsy-proven metastatic lymph node in right axilla; possible metastatic intramammary lymph node in posterior outer right breast; no evidence of malignancy on left. -staging CT CAP and bone scan on 03/05/22 were negative for metastatic disease. -baseline echo on 03/05/22 showed EF of 55-60%, normal. -she began neoadjuvant TCHP on 03/07/22. She tolerates very well with no major side effects.  -Due to diffuse firmness of her right breast, she is scheduled for right breast US on 05/14/2022 to evaluate her response to chemo.   2. Genetics -she has a family history of breast cancer in a paternal aunt and a niece, liver cancer in her maternal grandfather, and prostate cancer in her father. -testing performed on 02/21/22 was negative, with a VUS in [place]   3. Bone Health  -Her most recent DEXA was on 04/29/20 showing osteopenia (T-score of -2.1 at L1-3).   4. COPD and Smoking Cessation -she reports she quit smoking on 03/01/22. She notes she is currently using a non-nicotine vape, which has aided her in quitting.   5. HTN and prediabetes, anxiety -Continue medication, and follow-up with PCP -We will follow-up her blood pressure and blood glucose closely during her chemotherapy     PLAN:  -labs from today were reviewed and adequate for treatment. Mild anemia with Hgb 11.0. WBC 12.5 due to Udenyca injection.  -Proceed with Cycle 4 of TCHP today.  -[place] of right breast scheduled for 05/14/2022 -RTC on 05/30/2022 for port labs, follow up visit with Dr. Burr Medico and Cycle 5 of TCHP  No orders of the defined types were placed in this encounter.  All questions were answered. The patient knows to call the clinic with any problems, questions or concerns. No barriers to learning was detected.  I have spent a total of 30 minutes minutes of face-to-face and non-face-to-face time, preparing to see the patient, performing a medically appropriate  examination, counseling and educating the patient, ordering medications/tests,  documenting clinical information in the electronic health record,  and care coordination.   Dede Query PA-C Dept of Hematology and [place] at [facility] Phone: [phone]

## 2022-05-09 NOTE — Patient Instructions (Signed)
[facility] MEDICAL ONCOLOGY  Discharge Instructions: Thank you for choosing [facility] to provide your oncology and hematology care.   If you have a lab appointment with the Cancer Center, please go directly to the Cancer Center and check in at the registration area.   Wear comfortable clothing and clothing appropriate for easy access to any Portacath or PICC line.   We strive to give you quality time with your provider. You may need to reschedule your appointment if you arrive late (15 or more minutes).  Arriving late affects you and other patients whose appointments are after yours.  Also, if you miss three or more appointments without notifying the office, you may be dismissed from the clinic at the provider's discretion.      For prescription refill requests, have your pharmacy contact our office and allow 72 hours for refills to be completed.    Today you received the following chemotherapy and/or immunotherapy agents: Trastuzumab, Pertuzumab, Docetaxel, and Carboplatin      To help prevent nausea and vomiting after your treatment, we encourage you to take your nausea medication as directed.  BELOW ARE SYMPTOMS THAT SHOULD BE REPORTED IMMEDIATELY: *FEVER GREATER THAN 100.4 F (38 C) OR HIGHER *CHILLS OR SWEATING *NAUSEA AND VOMITING THAT IS NOT CONTROLLED WITH YOUR NAUSEA MEDICATION *UNUSUAL SHORTNESS OF BREATH *UNUSUAL BRUISING OR BLEEDING *URINARY PROBLEMS (pain or burning when urinating, or frequent urination) *BOWEL PROBLEMS (unusual diarrhea, constipation, pain near the anus) TENDERNESS IN MOUTH AND THROAT WITH OR WITHOUT PRESENCE OF ULCERS (sore throat, sores in mouth, or a toothache) UNUSUAL RASH, SWELLING OR PAIN  UNUSUAL VAGINAL DISCHARGE OR ITCHING   Items with * indicate a potential emergency and should be followed up as soon as possible or go to the Emergency Department if any problems should occur.  Please show the CHEMOTHERAPY ALERT CARD  or IMMUNOTHERAPY ALERT CARD at check-in to the Emergency Department and triage nurse.  Should you have questions after your visit or need to cancel or reschedule your appointment, please contact [facility]  Dept: [phone]  and follow the prompts.  Office hours are 8:00 a.m. to 4:30 p.m. Monday - Friday. Please note that voicemails left after 4:00 p.m. may not be returned until the following business day.  We are closed weekends and major holidays. You have access to a nurse at all times for urgent questions. Please call the main number to the clinic Dept: [phone] and follow the prompts.   For any non-urgent questions, you may also contact your provider using MyChart. We now offer e-Visits for anyone 18 and older to request care online for non-urgent symptoms. For details visit [email or web address].   Also download the MyChart app! Go to the app store, search "MyChart", open the app, select [facility], and log in with your MyChart username and password.  Due to Covid, a mask is required upon entering the hospital/clinic. If you do not have a mask, one will be given to you upon arrival. For doctor visits, patients may have 1 support person aged 18 or older with them. For treatment visits, patients cannot have anyone with them due to current Covid guidelines and our immunocompromised population.  

## 2022-05-11 ENCOUNTER — Other Ambulatory Visit: Payer: Self-pay

## 2022-05-11 ENCOUNTER — Inpatient Hospital Stay (HOSPITAL_BASED_OUTPATIENT_CLINIC_OR_DEPARTMENT_OTHER): Payer: PPO

## 2022-05-11 VITALS — BP 128/74 | HR 64 | Temp 98.8°F | Resp 18

## 2022-05-11 DIAGNOSIS — Z5112 Encounter for antineoplastic immunotherapy: Secondary | ICD-10-CM | POA: Diagnosis not present

## 2022-05-11 DIAGNOSIS — Z170 Estrogen receptor positive status [ER+]: Secondary | ICD-10-CM

## 2022-05-11 MED ORDER — PEGFILGRASTIM-CBQV 6 MG/0.6ML ~~LOC~~ SOSY
6.0000 mg | PREFILLED_SYRINGE | Freq: Once | SUBCUTANEOUS | Status: AC
  Administered 2022-05-11: 6 mg via SUBCUTANEOUS
  Filled 2022-05-11: qty 0.6

## 2022-05-14 ENCOUNTER — Ambulatory Visit
Admission: RE | Admit: 2022-05-14 | Discharge: 2022-05-14 | Disposition: A | Discharge status: Home / Self Care | Payer: PPO | Source: Ambulatory Visit | Attending: Hematology | Admitting: Hematology

## 2022-05-14 DIAGNOSIS — N6311 Unspecified lump in the right breast, upper outer quadrant: Secondary | ICD-10-CM | POA: Diagnosis not present

## 2022-05-14 DIAGNOSIS — Z170 Estrogen receptor positive status [ER+]: Secondary | ICD-10-CM

## 2022-05-15 ENCOUNTER — Telehealth: Payer: Self-pay | Admitting: Physician Assistant

## 2022-05-15 NOTE — Telephone Encounter (Signed)
I called [name] by phone to review the breast US results completed yesterday on 05/14/2022. Findings show stable disease. After discussion with Dr. Burr Medico, the recommendation is to continue on the same chemotherapy regimen for total of 6 cycles as originally planned. [name] expressed understanding of the plan provided.

## 2022-05-16 DIAGNOSIS — M25532 Pain in left wrist: Secondary | ICD-10-CM | POA: Diagnosis not present

## 2022-05-22 ENCOUNTER — Other Ambulatory Visit (HOSPITAL_COMMUNITY): Payer: Self-pay

## 2022-05-22 ENCOUNTER — Other Ambulatory Visit: Payer: Self-pay | Admitting: Cardiology

## 2022-05-22 ENCOUNTER — Other Ambulatory Visit: Payer: Self-pay | Admitting: Pulmonary Disease

## 2022-05-22 MED ORDER — AMLODIPINE BESYLATE 10 MG PO TABS
ORAL_TABLET | Freq: Every day | ORAL | 0 refills | Status: DC
  Filled 2022-05-22: qty 15, 15d supply, fill #0

## 2022-05-24 ENCOUNTER — Other Ambulatory Visit (HOSPITAL_COMMUNITY): Payer: Self-pay

## 2022-05-24 MED ORDER — TRELEGY ELLIPTA 200-62.5-25 MCG/ACT IN AEPB
INHALATION_SPRAY | RESPIRATORY_TRACT | 2 refills | Status: DC
  Filled 2022-05-24: qty 60, 30d supply, fill #0
  Filled 2022-06-21: qty 60, 30d supply, fill #1
  Filled 2022-07-19: qty 60, 30d supply, fill #2

## 2022-05-29 ENCOUNTER — Other Ambulatory Visit: Payer: Self-pay | Admitting: Hematology

## 2022-05-29 DIAGNOSIS — Z170 Estrogen receptor positive status [ER+]: Secondary | ICD-10-CM

## 2022-05-29 MED FILL — Dexamethasone Sodium Phosphate Inj 100 MG/10ML: INTRAMUSCULAR | Qty: 1 | Status: AC

## 2022-05-29 MED FILL — Fosaprepitant Dimeglumine For IV Infusion 150 MG (Base Eq): INTRAVENOUS | Qty: 5 | Status: AC

## 2022-05-30 ENCOUNTER — Inpatient Hospital Stay: Payer: PPO

## 2022-05-30 ENCOUNTER — Other Ambulatory Visit: Payer: Self-pay

## 2022-05-30 ENCOUNTER — Inpatient Hospital Stay (HOSPITAL_BASED_OUTPATIENT_CLINIC_OR_DEPARTMENT_OTHER): Payer: PPO | Admitting: Hematology

## 2022-05-30 ENCOUNTER — Encounter: Payer: Self-pay | Admitting: Hematology

## 2022-05-30 VITALS — BP 146/85 | HR 66 | Temp 98.0°F | Resp 19 | Ht 69.0 in | Wt 158.0 lb

## 2022-05-30 DIAGNOSIS — Z170 Estrogen receptor positive status [ER+]: Secondary | ICD-10-CM | POA: Diagnosis not present

## 2022-05-30 DIAGNOSIS — C50811 Malignant neoplasm of overlapping sites of right female breast: Secondary | ICD-10-CM | POA: Diagnosis not present

## 2022-05-30 DIAGNOSIS — Z5112 Encounter for antineoplastic immunotherapy: Secondary | ICD-10-CM | POA: Diagnosis not present

## 2022-05-30 DIAGNOSIS — Z95828 Presence of other vascular implants and grafts: Secondary | ICD-10-CM

## 2022-05-30 LAB — CBC WITH DIFFERENTIAL (CANCER CENTER ONLY)
Abs Immature Granulocytes: 0.06 10*3/uL (ref 0.00–0.07)
Basophils Absolute: 0 10*3/uL (ref 0.0–0.1)
Basophils Relative: 0 %
Eosinophils Absolute: 0 10*3/uL (ref 0.0–0.5)
Eosinophils Relative: 0 %
HCT: 30.7 % — ABNORMAL LOW (ref 36.0–46.0)
Hemoglobin: 10.5 g/dL — ABNORMAL LOW (ref 12.0–15.0)
Immature Granulocytes: 1 %
Lymphocytes Relative: 8 %
Lymphs Abs: 0.6 10*3/uL — ABNORMAL LOW (ref 0.7–4.0)
MCH: 35.2 pg — ABNORMAL HIGH (ref 26.0–34.0)
MCHC: 34.2 g/dL (ref 30.0–36.0)
MCV: 103 fL — ABNORMAL HIGH (ref 80.0–100.0)
Monocytes Absolute: 0.4 10*3/uL (ref 0.1–1.0)
Monocytes Relative: 5 %
Neutro Abs: 7 10*3/uL (ref 1.7–7.7)
Neutrophils Relative %: 86 %
Platelet Count: 310 10*3/uL (ref 150–400)
RBC: 2.98 MIL/uL — ABNORMAL LOW (ref 3.87–5.11)
RDW: 15.7 % — ABNORMAL HIGH (ref 11.5–15.5)
WBC Count: 8.1 10*3/uL (ref 4.0–10.5)
nRBC: 0 % (ref 0.0–0.2)

## 2022-05-30 LAB — CMP (CANCER CENTER ONLY)
ALT: 12 U/L (ref 0–44)
AST: 15 U/L (ref 15–41)
Albumin: 3.9 g/dL (ref 3.5–5.0)
Alkaline Phosphatase: 88 U/L (ref 38–126)
Anion gap: 5 (ref 5–15)
BUN: 18 mg/dL (ref 8–23)
CO2: 26 mmol/L (ref 22–32)
Calcium: 9.2 mg/dL (ref 8.9–10.3)
Chloride: 104 mmol/L (ref 98–111)
Creatinine: 0.59 mg/dL (ref 0.44–1.00)
GFR, Estimated: >60 mL/min (ref >60)
Glucose, Bld: 133 mg/dL — ABNORMAL HIGH (ref 70–99)
Potassium: 4.5 mmol/L (ref 3.5–5.1)
Sodium: 135 mmol/L (ref 135–145)
Total Bilirubin: 0.3 mg/dL (ref 0.3–1.2)
Total Protein: 6.4 g/dL — ABNORMAL LOW (ref 6.5–8.1)

## 2022-05-30 MED ORDER — TRASTUZUMAB-DKST CHEMO 150 MG IV SOLR
6.0000 mg/kg | Freq: Once | INTRAVENOUS | Status: AC
  Administered 2022-05-30: 420 mg via INTRAVENOUS
  Filled 2022-05-30: qty 20

## 2022-05-30 MED ORDER — SODIUM CHLORIDE 0.9 % IV SOLN
75.0000 mg/m2 | Freq: Once | INTRAVENOUS | Status: AC
  Administered 2022-05-30: 130 mg via INTRAVENOUS
  Filled 2022-05-30: qty 13

## 2022-05-30 MED ORDER — SODIUM CHLORIDE 0.9 % IV SOLN
448.2500 mg | Freq: Once | INTRAVENOUS | Status: AC
  Administered 2022-05-30: 450 mg via INTRAVENOUS
  Filled 2022-05-30: qty 45

## 2022-05-30 MED ORDER — DIPHENHYDRAMINE HCL 25 MG PO CAPS
50.0000 mg | ORAL_CAPSULE | Freq: Once | ORAL | Status: AC
  Administered 2022-05-30: 50 mg via ORAL
  Filled 2022-05-30: qty 2

## 2022-05-30 MED ORDER — SODIUM CHLORIDE 0.9% FLUSH
10.0000 mL | Freq: Once | INTRAVENOUS | Status: AC
  Administered 2022-05-30: 10 mL

## 2022-05-30 MED ORDER — SODIUM CHLORIDE 0.9% FLUSH
10.0000 mL | INTRAVENOUS | Status: DC | PRN
  Administered 2022-05-30: 10 mL

## 2022-05-30 MED ORDER — HEPARIN SOD (PORK) LOCK FLUSH 100 UNIT/ML IV SOLN
500.0000 [IU] | Freq: Once | INTRAVENOUS | Status: AC | PRN
  Administered 2022-05-30: 500 [IU]

## 2022-05-30 MED ORDER — ACETAMINOPHEN 325 MG PO TABS
650.0000 mg | ORAL_TABLET | Freq: Once | ORAL | Status: AC
  Administered 2022-05-30: 650 mg via ORAL
  Filled 2022-05-30: qty 2

## 2022-05-30 MED ORDER — SODIUM CHLORIDE 0.9 % IV SOLN: Freq: Once | INTRAVENOUS | Status: AC

## 2022-05-30 MED ORDER — PALONOSETRON HCL INJECTION 0.25 MG/5ML
0.2500 mg | Freq: Once | INTRAVENOUS | Status: AC
  Administered 2022-05-30: 0.25 mg via INTRAVENOUS
  Filled 2022-05-30: qty 5

## 2022-05-30 MED ORDER — SODIUM CHLORIDE 0.9 % IV SOLN
150.0000 mg | Freq: Once | INTRAVENOUS | Status: AC
  Administered 2022-05-30: 150 mg via INTRAVENOUS
  Filled 2022-05-30: qty 150

## 2022-05-30 MED ORDER — SODIUM CHLORIDE 0.9 % IV SOLN
10.0000 mg | Freq: Once | INTRAVENOUS | Status: AC
  Administered 2022-05-30: 10 mg via INTRAVENOUS
  Filled 2022-05-30: qty 10

## 2022-05-30 MED ORDER — SODIUM CHLORIDE 0.9 % IV SOLN
420.0000 mg | Freq: Once | INTRAVENOUS | Status: AC
  Administered 2022-05-30: 420 mg via INTRAVENOUS
  Filled 2022-05-30: qty 14

## 2022-05-30 NOTE — Progress Notes (Signed)
[facility]   Telephone:(336) [phone] Fax:(336) [phone]   Clinic Follow up Note   Patient Care Team: Rankins, Bill Salinas, MD as PCP - General (Family Medicine) Leonie Man, MD as PCP - Cardiology (Cardiology) Mauro Kaufmann, RN as Oncology Nurse Navigator Rockwell Germany, RN as Oncology Nurse Navigator Rolm Bookbinder, MD as Consulting Physician (General Surgery) Truitt Merle, MD as Consulting Physician (Hematology) Kyung Rudd, MD as Consulting Physician (Radiation Oncology)  Date of Service:  05/30/2022  CHIEF COMPLAINT: f/u of right breast cancer  CURRENT THERAPY:  Neoadjuvant TCHP, q21d, started 03/07/22  ASSESSMENT & PLAN:  Ashley Robertson is a 66 y.o. post-menopausal female with   1. Malignant neoplasm of overlapping sites of right breast, invasive lobular carcinoma, Stage IIIA, c(T3, N1), ER+/PR-/HER2+, Grade 2  -presented with new right breast mass. Right MM and [place] on 02/08/22 showed malignancy throughout right breast with discrete masses at 11 and 6 o'clock, and one borderline lymph node. Biopsy on 02/09/22 revealed invasive lobular carcinoma to both sites and metastatic carcinoma in lymph node. She has mass like hard tissue in most part of her right breast but no clinical concerns of inflammatory breast cancer, she also has a large palpable right axillary lymph node. -breast MRI on 03/01/22 showed: 8.5 cm area of confluent mass-like enhancement in right breast involving all 4 quadrants; biopsy-proven metastatic lymph node in right axilla; possible metastatic intramammary lymph node in posterior outer right breast; no evidence of malignancy on left. -staging CT CAP and bone scan on 03/05/22 were negative for metastatic disease. -baseline echo on 03/05/22 showed EF of 55-60%, normal. -she began neoadjuvant TCHP on 03/07/22. She tolerates very well with no major side effects.  -right breast US on 05/14/22 showed grossly stable appearance of irregular 2.9 cm mass at 11  o'clock, though exact measurements difficult due to diffuse nature of mass. Clinical exam showed stable large central right breast mass. Will plan for post-treatment MRI after cycle 6; I ordered today. -she continues to tolerate well. Labs reviewed, overall stable, hgb down slightly to 10.5. Adequate to proceed with cycle 5 today.   2. Genetics -she has a family history of breast cancer in a paternal aunt and a niece, liver cancer in her maternal grandfather, and prostate cancer in her father. -testing performed on 02/21/22 was negative, with a VUS in [place]   3. Bone Health  -Her most recent DEXA was on 04/29/20 showing osteopenia (T-score of -2.1 at L1-3).   4. COPD and Smoking Cessation -she reports she quit smoking on 03/01/22. She notes she is currently using a non-nicotine vape, which has aided her in quitting.   5. HTN and prediabetes, anxiety -Continue medication, and follow-up with PCP -We will follow-up her blood pressure and blood glucose closely during her chemotherapy     PLAN:  -Proceed with Cycle 5 of TCHP today.   -GCF-S injection on day 3 -lab, flush, f/u, and C6 on 7/19 as scheduled (last cycle) -plan for post-treatment breast MRI on 7/26  -she prefers Wednesday appointment   No problem-specific Assessment & Plan notes found for this encounter.   SUMMARY OF ONCOLOGIC HISTORY: Oncology History Overview Note   Cancer Staging  Malignant neoplasm of overlapping sites of right breast in female, estrogen receptor positive ([place]) Staging form: Breast, AJCC 8th Edition - Clinical stage from 02/09/2022: Stage IIA (cT2, cN1, cM0, G2, ER+, PR-, HER2+) - Signed by Truitt Merle, MD on 02/20/2022    Malignant neoplasm of  overlapping sites of right breast in female, estrogen receptor positive ([place])  02/08/2022 Mammogram   CLINICAL DATA:  Acute onset right breast lump.  EXAM: DIGITAL DIAGNOSTIC UNILATERAL RIGHT MAMMOGRAM WITH TOMOSYNTHESIS AND CAD; ULTRASOUND RIGHT BREAST  LIMITED  IMPRESSION: 1. Highly suspicious findings mammographically and sonographically. I suspect there is significant malignancy throughout most of the right breast. Discrete masses are seen at 11 o'clock and 6 o'clock.  A single borderline lymph node is identified. This lymph node appears prominent compared to the remainder of the lymph nodes. The skin thickening suggests the possibility of inflammatory breast cancer.   02/09/2022 Cancer Staging   Staging form: Breast, AJCC 8th Edition - Clinical stage from 02/09/2022: Stage IIIA (cT3, cN1, cM0, G2, ER+, PR-, HER2+) - Signed by Truitt Merle, MD on 03/05/2022 Stage prefix: Initial diagnosis Histologic grading system: 3 grade system   02/09/2022 Initial Biopsy   Diagnosis 1. Breast, right, needle core biopsy, right breast 11 o'clock mass ribbon clip - INVASIVE MAMMARY CARCINOMA - SEE COMMENT 2. Breast, right, needle core biopsy, right breast 6'oclock mass, coil clip - INVASIVE MAMMARY CARCINOMA - SEE COMMENT 3. Lymph node, needle/core biopsy, right axillary lymph node, tribell clip - METASTATIC CARCINOMA INVOLVING NODAL TISSUE Microscopic Comment 1. and 2. The biopsy material shows an infiltrative proliferation of cells arranged linearly and in small clusters. Based on the biopsy, the carcinoma appears Nottingham grade 2 of 3 and measures 1.5 cm in greatest linear extent.  Addendum parts 1 and 2: Immunohistochemistry for E-cadherin is negative consistent with lobular carcinoma.  2. PROGNOSTIC INDICATORS Results: The tumor cells are EQUIVOCAL for Her2 (2+). Her2 by FISH will be performed and the results reported separately. Estrogen Receptor: 100%, POSITIVE, STRONG STAINING INTENSITY Progesterone Receptor: <1%, NEGATIVE Proliferation Marker Ki67: 10%  2. FLUORESCENCE IN-SITU HYBRIDIZATION Results: GROUP 1: HER2 **POSITIVE**   3. PROGNOSTIC INDICATORS Results: By immunohistochemistry, the tumor cells are POSITIVE for Her2  (3+). Estrogen Receptor: 100%, POSITIVE, STRONG STAINING INTENSITY Progesterone Receptor: <1%, NEGATIVE   02/16/2022 Initial Diagnosis   Malignant neoplasm of overlapping sites of right breast in female, estrogen receptor positive ([place])   03/01/2022 Imaging   EXAM: BILATERAL BREAST MRI WITH AND WITHOUT CONTRAST  IMPRESSION: 1. 8.5 x 8.3 x 5.9 cm area of confluent mass-like enhancement in the right breast involving all 4 quadrants and containing 2 biopsy marker clip artifacts, compatible with 4 quadrant biopsy-proven malignancy. 2. Biopsy-proven metastatic lymph node in the right axilla. 3. Possible metastatic intramammary lymph node in the posterior outer right breast just above the level of the nipple. 4. No evidence of malignancy on the left.   03/01/2022 Genetic Testing   Negative hereditary cancer genetic testing: no pathogenic variants detected in [facility] CustomNext-Cancer +RNAinsight Panel.  Variant of uncertain significance reported in BRIP1 at p.F934V (c.2800T>G). Report date is March 01, 2022.    The CustomNext-Cancer+RNAinsight panel offered by [facility] includes sequencing and rearrangement analysis for the following 47 genes:  APC, ATM, AXIN2, BARD1, BMPR1A, BRCA1, BRCA2, BRIP1, CDH1, CDK4, CDKN2A, CHEK2, DICER1, EPCAM, GREM1, HOXB13, MEN1, MLH1, MSH2, MSH3, MSH6, MUTYH, NBN, NF1, NF2, NTHL1, PALB2, PMS2, POLD1, POLE, PTEN, RAD51C, RAD51D, RECQL, RET, SDHA, SDHAF2, SDHB, SDHC, SDHD, SMAD4, SMARCA4, STK11, TP53, TSC1, TSC2, and VHL.  RNA data is routinely analyzed for use in variant interpretation for all genes.   03/05/2022 Imaging   EXAM: CT CHEST, ABDOMEN, AND PELVIS WITH CONTRAST  IMPRESSION: 1. Asymmetric, masslike density of the glandular tissue of the right breast with overlying  skin thickening, in keeping with known primary breast malignancy. 2. Enlarged, ill-defined right axillary lymph node containing a biopsy marking clip, consistent with known nodal metastatic  disease. 3. No other evidence of lymphadenopathy or metastatic disease in the chest, abdomen, or pelvis. 4. Emphysema. Background of fine centrilobular nodularity, most concentrated in the lung apices, consistent with smoking-related respiratory bronchiolitis. 5. Aneurysm of the infrarenal abdominal aorta measuring up to 5.4 x 5.3 cm with a large burden of eccentric mural thrombus. Recommend follow-up CT/MR every 6 months and vascular consultation. This recommendation follows ACR consensus guidelines: White Paper of the ACR Incidental Findings Committee II on Vascular Findings. J Am Coll Radiol 2013; 10:789-794. 6. Coronary artery disease.   03/05/2022 Imaging   EXAM: NUCLEAR MEDICINE WHOLE BODY BONE SCAN  IMPRESSION: No evidence of bony metastatic disease.   03/07/2022 -  Chemotherapy   Patient is on Treatment Plan : BREAST  Docetaxel + Carboplatin + Trastuzumab + Pertuzumab  (TCHP) q21d         INTERVAL HISTORY:  Ashley Robertson is here for a follow up of breast cancer. She was last seen by PA Murray Hodgkins on 05/09/22. She presents to the clinic alone. She reports she continues to do well on treatment. She notes she quit smoking in April and has had no nicotine since then.   All other systems were reviewed with the patient and are negative.  MEDICAL HISTORY:  Past Medical History:  Diagnosis Date   Allergy    Lisinopril and lipitor   Anxiety and depression    Aortic atherosclerosis ([facility]) 03/2020   CT Chest: 2 V (LAD & LCx) Coronary Atherosclerosis, Aortic Atherosclerosis (no aneurysm).  Mild centrilobular emphysema with mild diffuse bronchial thickening; several scattered small solitary pulmonary nodules (largest 5.6 mm in anterior left upper lobe)   Breast cancer ([place]) 12/2021   Right breast ILC   Carotid artery plaque, bilateral 10/2015   Mild to moderate plaque L>R without significant stenosis   COPD (chronic obstructive pulmonary disease) (H[facility])    Coronary Artery Calcification -  Score 79    Coronary Calcium Score 79.  LAD and circumflex calcification noted.  Normal ascending aorta with mild calcification.   Current every day smoker    Emphysema lung ([place])    Noted on chest CT   Family history of breast cancer 02/21/2022   Family history of prostate cancer 02/21/2022   Hyperlipidemia    Hypertension    Controlled with amlodipine   Prediabetes    Skin cancer 2021   Remove 2022    SURGICAL HISTORY: Past Surgical History:  Procedure Laterality Date   CESAREAN SECTION  1990, [place] Left 03/06/2022   Procedure: INSERTION PORT-A-CATH;  Surgeon: Rolm Bookbinder, MD;  Location: [facility];  Service: General;  Laterality: Left;   [place] ECHOCARDIOGRAM  10/2016   EF 55 to 60%.  Normal systolic and diastolic function.  No ASD/PFO   TUBAL LIGATION  1991    I have reviewed the social history and family history with the patient and they are unchanged from previous note.  ALLERGIES:  is allergic to lisinopril and lipitor [atorvastatin].  MEDICATIONS:  Current Outpatient Medications  Medication Sig Dispense Refill   albuterol (PROVENTIL HFA) 108 (90 Base) MCG/ACT inhaler Inhale 1 to 2 puffs into the lungs every 6 hours as needed 8.5 g 2  amLODipine (NORVASC) 10 MG tablet TAKE 1 TABLET BY MOUTH AT BEDTIME--need office visit for further refills 15 tablet 0   aspirin 81 MG tablet Take 81 mg by mouth daily.     Calcium Carb-Cholecalciferol (CALCIUM 500 + D3 PO) Take 1 tablet by mouth daily.     Cholecalciferol (VITAMIN D) 50 MCG (2000 UT) CAPS Take by mouth.     dexamethasone (DECADRON) 4 MG tablet Take one tablet twice daily the day before Taxotere, then take 1 tablet daily for 3 days after chemotherapy. 30 tablet 0   FLUoxetine (PROZAC) 20 MG capsule Take 1 capsule by mouth daily. 90 capsule 1   Fluticasone-Umeclidin-Vilant (TRELEGY ELLIPTA)  200-62.5-25 MCG/ACT AEPB Inhale 1 puff into the lungs daily 60 each 2   lidocaine-prilocaine (EMLA) cream Apply to affected area once 30 g 3   ondansetron (ZOFRAN) 8 MG tablet Take 1 tablet by mouth 2 (two) times daily as needed for nausea or vomiting. Start on the third day after chemotherapy. 30 tablet 1   OVER THE COUNTER MEDICATION Serrapeptase 120,000 units dailyReported on 12/27/2015     prochlorperazine (COMPAZINE) 10 MG tablet Take 1 tablet (10 mg total) by mouth every 6 (six) hours as needed for nausea or vomiting. 30 tablet 1   rosuvastatin (CRESTOR) 5 MG tablet Take 1 tablet by mouth daily at bedtime ,except on Monday, Wednesday and Fridays take 2 tablets at bedtime as directed 180 tablet 3   Specialty Vitamins Products (MAGNESIUM, AMINO ACID CHELATE,) 133 MG tablet Take 1 tablet by mouth at bedtime. PURE magnesium supplement - 120 mg of mag per capsule     traMADol (ULTRAM) 50 MG tablet Take 1 tablet (50 mg total) by mouth every 6 (six) hours as needed. 10 tablet 0   No current facility-administered medications for this visit.   Facility-Administered Medications Ordered in Other Visits  Medication Dose Route Frequency Provider Last Rate Last Admin   CARBOplatin (PARAPLATIN) 450 mg in sodium chloride 0.9 % 250 mL chemo infusion  450 mg Intravenous Once Truitt Merle, MD       dexamethasone (DECADRON) 10 mg in sodium chloride 0.9 % 50 mL IVPB  10 mg Intravenous Once Truitt Merle, MD 204 mL/hr at 05/30/22 1002 10 mg at 05/30/22 1002   DOCEtaxel (TAXOTERE) 130 mg in sodium chloride 0.9 % 250 mL chemo infusion  75 mg/m2 (Treatment Plan Recorded) Intravenous Once Truitt Merle, MD       fosaprepitant (EMEND) 150 mg in sodium chloride 0.9 % 145 mL IVPB  150 mg Intravenous Once Truitt Merle, MD       heparin lock flush 100 unit/mL  500 Units Intracatheter Once PRN Truitt Merle, MD       pertuzumab (PERJETA) 420 mg in sodium chloride 0.9 % 250 mL chemo infusion  420 mg Intravenous Once Truitt Merle, MD       sodium  chloride flush (NS) 0.9 % injection 10 mL  10 mL Intracatheter PRN Truitt Merle, MD       trastuzumab-dkst (OGIVRI) 420 mg in sodium chloride 0.9 % 250 mL chemo infusion  6 mg/kg (Treatment Plan Recorded) Intravenous Once Truitt Merle, MD        PHYSICAL EXAMINATION: ECOG PERFORMANCE STATUS: 1 - Symptomatic but completely ambulatory  Vitals:   05/30/22 0931  BP: (!) 146/85  Pulse: 66  Resp: 19  Temp: 98 F (36.7 C)  SpO2: 95%   Wt Readings from Last 3 Encounters:  05/30/22 158 lb (71.7 kg)  05/09/22 155 lb 4.8 oz (70.4 kg)  04/17/22 155 lb 6.4 oz (70.5 kg)     GENERAL:alert, no distress and comfortable SKIN: skin color, texture, turgor are normal, no rashes or significant lesions EYES: normal, Conjunctiva are pink and non-injected, sclera clear  NECK: supple, thyroid normal size, non-tender, without nodularity LYMPH:  no palpable lymphadenopathy in the cervical, axillary LUNGS: clear to auscultation and percussion with normal breathing effort HEART: regular rate & rhythm and no murmurs and no lower extremity edema ABDOMEN:abdomen soft, non-tender and normal bowel sounds Musculoskeletal:no cyanosis of digits and no clubbing  NEURO: alert & oriented x 3 with fluent speech, no focal motor/sensory deficits BREAST: discrete 9 x 9 cm mass in right breast with inverted nipple. Left breast benign.  LABORATORY DATA:  I have reviewed the data as listed    Latest Ref Rng & Units 05/30/2022    9:16 AM 05/09/2022    9:52 AM 04/17/2022    8:27 AM  CBC  WBC 4.0 - 10.5 K/uL 8.1  12.5  12.5   Hemoglobin 12.0 - 15.0 g/dL 10.5  11.0  10.9   Hematocrit 36.0 - 46.0 % 30.7  32.3  31.1   Platelets 150 - 400 K/uL 310  283  285         Latest Ref Rng & Units 05/30/2022    9:16 AM 05/09/2022    9:52 AM 04/17/2022    8:27 AM  CMP  Glucose 70 - 99 mg/dL 133  129  138   BUN 8 - 23 mg/dL '18  14  21   ' Creatinine 0.44 - 1.00 mg/dL 0.59  0.49  0.55   Sodium 135 - 145 mmol/L 135  138  137   Potassium 3.5  - 5.1 mmol/L 4.5  4.2  4.5   Chloride 98 - 111 mmol/L 104  104  104   CO2 22 - 32 mmol/L '26  28  27   ' Calcium 8.9 - 10.3 mg/dL 9.2  9.5  8.9   Total Protein 6.5 - 8.1 g/dL 6.4  6.6  6.3   Total Bilirubin 0.3 - 1.2 mg/dL 0.3  0.3  0.3   Alkaline Phos 38 - 126 U/L 88  87  90   AST 15 - 41 U/L '15  14  18   ' ALT 0 - 44 U/L '12  12  20       ' RADIOGRAPHIC STUDIES: I have personally reviewed the radiological images as listed and agreed with the findings in the report. No results found.    Orders Placed This Encounter  Procedures   MR BREAST BILATERAL W WO CONTRAST INC CAD    Standing Status:   Future    Standing Expiration Date:   05/31/2023    Order Specific Question:   If indicated for the ordered procedure, I authorize the administration of contrast media per Radiology protocol    Answer:   Yes    Order Specific Question:   What is the patient's sedation requirement?    Answer:   No Sedation    Order Specific Question:   Does the patient have a pacemaker or implanted devices?    Answer:   No    Order Specific Question:   Radiology Contrast Protocol - do NOT remove file path    Answer:   [email or web address]    Order Specific Question:   Preferred imaging location?    Answer:   [facility] (table limit-550lbs)   All questions were  answered. The patient knows to call the clinic with any problems, questions or concerns. No barriers to learning was detected. The total time spent in the appointment was 30 minutes.     Truitt Merle, MD 05/30/2022   I, Wilburn Mylar, am acting as scribe for Truitt Merle, MD.   I have reviewed the above documentation for accuracy and completeness, and I agree with the above.

## 2022-05-30 NOTE — Patient Instructions (Signed)
[facility] ONCOLOGY  Discharge Instructions: Thank you for choosing [facility] to provide your oncology and hematology care.   If you have a lab appointment with the [facility], please go directly to the [facility] and check in at the registration area.   Wear comfortable clothing and clothing appropriate for easy access to any Portacath or PICC line.   We strive to give you quality time with your provider. You may need to reschedule your appointment if you arrive late (15 or more minutes).  Arriving late affects you and other patients whose appointments are after yours.  Also, if you miss three or more appointments without notifying the office, you may be dismissed from the clinic at the provider's discretion.      For prescription refill requests, have your pharmacy contact our office and allow 72 hours for refills to be completed.    Today you received the following chemotherapy and/or immunotherapy agents: Trastuzumab, Pertuzumab, Docetaxel, and Carboplatin      To help prevent nausea and vomiting after your treatment, we encourage you to take your nausea medication as directed.  BELOW ARE SYMPTOMS THAT SHOULD BE REPORTED IMMEDIATELY: *FEVER GREATER THAN 100.4 F (38 C) OR HIGHER *CHILLS OR SWEATING *NAUSEA AND VOMITING THAT IS NOT CONTROLLED WITH YOUR NAUSEA MEDICATION *UNUSUAL SHORTNESS OF BREATH *UNUSUAL BRUISING OR BLEEDING *URINARY PROBLEMS (pain or burning when urinating, or frequent urination) *BOWEL PROBLEMS (unusual diarrhea, constipation, pain near the anus) TENDERNESS IN MOUTH AND THROAT WITH OR WITHOUT PRESENCE OF ULCERS (sore throat, sores in mouth, or a toothache) UNUSUAL RASH, SWELLING OR PAIN  UNUSUAL VAGINAL DISCHARGE OR ITCHING   Items with * indicate a potential emergency and should be followed up as soon as possible or go to the Emergency Department if any problems should occur.  Please show the CHEMOTHERAPY ALERT CARD  or IMMUNOTHERAPY ALERT CARD at check-in to the Emergency Department and triage nurse.  Should you have questions after your visit or need to cancel or reschedule your appointment, please contact [facility]  Dept: [phone]  and follow the prompts.  Office hours are 8:00 a.m. to 4:30 p.m. Monday - Friday. Please note that voicemails left after 4:00 p.m. may not be returned until the following business day.  We are closed weekends and major holidays. You have access to a nurse at all times for urgent questions. Please call the main number to the clinic Dept: [phone] and follow the prompts.   For any non-urgent questions, you may also contact your provider using MyChart. We now offer e-Visits for anyone 22 and older to request care online for non-urgent symptoms. For details visit [email or web address].   Also download the MyChart app! Go to the app store, search "MyChart", open the app, select [facility], and log in with your MyChart username and password.  Due to Covid, a mask is required upon entering the hospital/clinic. If you do not have a mask, one will be given to you upon arrival. For doctor visits, patients may have 1 support person aged 79 or older with them. For treatment visits, patients cannot have anyone with them due to current Covid guidelines and our immunocompromised population.

## 2022-05-31 ENCOUNTER — Encounter: Payer: Self-pay | Admitting: *Deleted

## 2022-05-31 LAB — CANCER ANTIGEN 27.29: CA 27.29: 25.4 U/mL (ref 0.0–38.6)

## 2022-06-01 ENCOUNTER — Other Ambulatory Visit: Payer: Self-pay

## 2022-06-01 ENCOUNTER — Inpatient Hospital Stay: Payer: PPO

## 2022-06-01 ENCOUNTER — Encounter: Payer: Self-pay | Admitting: Hematology

## 2022-06-01 VITALS — BP 134/74 | HR 60 | Temp 98.1°F | Resp 18

## 2022-06-01 DIAGNOSIS — Z170 Estrogen receptor positive status [ER+]: Secondary | ICD-10-CM

## 2022-06-01 DIAGNOSIS — Z5112 Encounter for antineoplastic immunotherapy: Secondary | ICD-10-CM | POA: Diagnosis not present

## 2022-06-01 MED ORDER — PEGFILGRASTIM-CBQV 6 MG/0.6ML ~~LOC~~ SOSY
6.0000 mg | PREFILLED_SYRINGE | Freq: Once | SUBCUTANEOUS | Status: AC
  Administered 2022-06-01: 6 mg via SUBCUTANEOUS
  Filled 2022-06-01: qty 0.6

## 2022-06-06 DIAGNOSIS — C50811 Malignant neoplasm of overlapping sites of right female breast: Secondary | ICD-10-CM | POA: Diagnosis not present

## 2022-06-06 DIAGNOSIS — Z170 Estrogen receptor positive status [ER+]: Secondary | ICD-10-CM | POA: Diagnosis not present

## 2022-06-09 ENCOUNTER — Other Ambulatory Visit: Payer: Self-pay | Admitting: Cardiology

## 2022-06-09 ENCOUNTER — Other Ambulatory Visit (HOSPITAL_COMMUNITY): Payer: Self-pay

## 2022-06-11 ENCOUNTER — Other Ambulatory Visit (HOSPITAL_COMMUNITY): Payer: Self-pay

## 2022-06-11 MED ORDER — AMLODIPINE BESYLATE 10 MG PO TABS
ORAL_TABLET | Freq: Every day | ORAL | 0 refills | Status: DC
  Filled 2022-06-11: qty 15, 15d supply, fill #0

## 2022-06-19 MED FILL — Fosaprepitant Dimeglumine For IV Infusion 150 MG (Base Eq): INTRAVENOUS | Qty: 5 | Status: AC

## 2022-06-19 MED FILL — Dexamethasone Sodium Phosphate Inj 100 MG/10ML: INTRAMUSCULAR | Qty: 1 | Status: AC

## 2022-06-20 ENCOUNTER — Inpatient Hospital Stay: Payer: PPO

## 2022-06-20 ENCOUNTER — Other Ambulatory Visit: Payer: Self-pay

## 2022-06-20 ENCOUNTER — Inpatient Hospital Stay (HOSPITAL_BASED_OUTPATIENT_CLINIC_OR_DEPARTMENT_OTHER): Payer: PPO | Admitting: Hematology

## 2022-06-20 ENCOUNTER — Encounter: Payer: Self-pay | Admitting: Hematology

## 2022-06-20 ENCOUNTER — Inpatient Hospital Stay: Payer: PPO | Attending: Hematology

## 2022-06-20 ENCOUNTER — Encounter: Payer: Self-pay | Admitting: *Deleted

## 2022-06-20 VITALS — BP 149/77 | HR 65 | Temp 98.4°F | Resp 18 | Ht 69.0 in | Wt 161.3 lb

## 2022-06-20 VITALS — BP 158/87 | HR 67 | Temp 97.9°F | Resp 16

## 2022-06-20 DIAGNOSIS — C50811 Malignant neoplasm of overlapping sites of right female breast: Secondary | ICD-10-CM | POA: Insufficient documentation

## 2022-06-20 DIAGNOSIS — Z7952 Long term (current) use of systemic steroids: Secondary | ICD-10-CM | POA: Diagnosis not present

## 2022-06-20 DIAGNOSIS — M8580 Other specified disorders of bone density and structure, unspecified site: Secondary | ICD-10-CM | POA: Diagnosis not present

## 2022-06-20 DIAGNOSIS — I10 Essential (primary) hypertension: Secondary | ICD-10-CM | POA: Insufficient documentation

## 2022-06-20 DIAGNOSIS — Z5189 Encounter for other specified aftercare: Secondary | ICD-10-CM | POA: Diagnosis not present

## 2022-06-20 DIAGNOSIS — F419 Anxiety disorder, unspecified: Secondary | ICD-10-CM | POA: Diagnosis not present

## 2022-06-20 DIAGNOSIS — C773 Secondary and unspecified malignant neoplasm of axilla and upper limb lymph nodes: Secondary | ICD-10-CM | POA: Insufficient documentation

## 2022-06-20 DIAGNOSIS — Z7982 Long term (current) use of aspirin: Secondary | ICD-10-CM | POA: Insufficient documentation

## 2022-06-20 DIAGNOSIS — Z5111 Encounter for antineoplastic chemotherapy: Secondary | ICD-10-CM | POA: Diagnosis not present

## 2022-06-20 DIAGNOSIS — Z79899 Other long term (current) drug therapy: Secondary | ICD-10-CM | POA: Diagnosis not present

## 2022-06-20 DIAGNOSIS — Z5112 Encounter for antineoplastic immunotherapy: Secondary | ICD-10-CM | POA: Insufficient documentation

## 2022-06-20 DIAGNOSIS — Z87891 Personal history of nicotine dependence: Secondary | ICD-10-CM | POA: Diagnosis not present

## 2022-06-20 DIAGNOSIS — Z170 Estrogen receptor positive status [ER+]: Secondary | ICD-10-CM

## 2022-06-20 DIAGNOSIS — Z95828 Presence of other vascular implants and grafts: Secondary | ICD-10-CM

## 2022-06-20 LAB — CBC WITH DIFFERENTIAL (CANCER CENTER ONLY)
Abs Immature Granulocytes: 0.09 10*3/uL — ABNORMAL HIGH (ref 0.00–0.07)
Basophils Absolute: 0 10*3/uL (ref 0.0–0.1)
Basophils Relative: 0 %
Eosinophils Absolute: 0 10*3/uL (ref 0.0–0.5)
Eosinophils Relative: 0 %
HCT: 30.7 % — ABNORMAL LOW (ref 36.0–46.0)
Hemoglobin: 10.6 g/dL — ABNORMAL LOW (ref 12.0–15.0)
Immature Granulocytes: 1 %
Lymphocytes Relative: 6 %
Lymphs Abs: 0.6 10*3/uL — ABNORMAL LOW (ref 0.7–4.0)
MCH: 36.2 pg — ABNORMAL HIGH (ref 26.0–34.0)
MCHC: 34.5 g/dL (ref 30.0–36.0)
MCV: 104.8 fL — ABNORMAL HIGH (ref 80.0–100.0)
Monocytes Absolute: 0.6 10*3/uL (ref 0.1–1.0)
Monocytes Relative: 6 %
Neutro Abs: 9 10*3/uL — ABNORMAL HIGH (ref 1.7–7.7)
Neutrophils Relative %: 87 %
Platelet Count: 298 10*3/uL (ref 150–400)
RBC: 2.93 MIL/uL — ABNORMAL LOW (ref 3.87–5.11)
RDW: 14.8 % (ref 11.5–15.5)
WBC Count: 10.3 10*3/uL (ref 4.0–10.5)
nRBC: 0 % (ref 0.0–0.2)

## 2022-06-20 LAB — CMP (CANCER CENTER ONLY)
ALT: 12 U/L (ref 0–44)
AST: 14 U/L — ABNORMAL LOW (ref 15–41)
Albumin: 4 g/dL (ref 3.5–5.0)
Alkaline Phosphatase: 90 U/L (ref 38–126)
Anion gap: 5 (ref 5–15)
BUN: 15 mg/dL (ref 8–23)
CO2: 27 mmol/L (ref 22–32)
Calcium: 9.3 mg/dL (ref 8.9–10.3)
Chloride: 104 mmol/L (ref 98–111)
Creatinine: 0.5 mg/dL (ref 0.44–1.00)
GFR, Estimated: >60 mL/min (ref >60)
Glucose, Bld: 137 mg/dL — ABNORMAL HIGH (ref 70–99)
Potassium: 4.1 mmol/L (ref 3.5–5.1)
Sodium: 136 mmol/L (ref 135–145)
Total Bilirubin: 0.3 mg/dL (ref 0.3–1.2)
Total Protein: 6.5 g/dL (ref 6.5–8.1)

## 2022-06-20 MED ORDER — DIPHENHYDRAMINE HCL 25 MG PO CAPS
50.0000 mg | ORAL_CAPSULE | Freq: Once | ORAL | Status: AC
  Administered 2022-06-20: 50 mg via ORAL
  Filled 2022-06-20: qty 2

## 2022-06-20 MED ORDER — SODIUM CHLORIDE 0.9 % IV SOLN: Freq: Once | INTRAVENOUS | Status: AC

## 2022-06-20 MED ORDER — TRASTUZUMAB-DKST CHEMO 150 MG IV SOLR
6.0000 mg/kg | Freq: Once | INTRAVENOUS | Status: AC
  Administered 2022-06-20: 420 mg via INTRAVENOUS
  Filled 2022-06-20: qty 20

## 2022-06-20 MED ORDER — SODIUM CHLORIDE 0.9% FLUSH
10.0000 mL | INTRAVENOUS | Status: DC | PRN
  Administered 2022-06-20: 10 mL

## 2022-06-20 MED ORDER — SODIUM CHLORIDE 0.9 % IV SOLN
150.0000 mg | Freq: Once | INTRAVENOUS | Status: AC
  Administered 2022-06-20: 150 mg via INTRAVENOUS
  Filled 2022-06-20: qty 150

## 2022-06-20 MED ORDER — SODIUM CHLORIDE 0.9 % IV SOLN
75.0000 mg/m2 | Freq: Once | INTRAVENOUS | Status: AC
  Administered 2022-06-20: 140 mg via INTRAVENOUS
  Filled 2022-06-20: qty 14

## 2022-06-20 MED ORDER — HEPARIN SOD (PORK) LOCK FLUSH 100 UNIT/ML IV SOLN
500.0000 [IU] | Freq: Once | INTRAVENOUS | Status: AC | PRN
  Administered 2022-06-20: 500 [IU]

## 2022-06-20 MED ORDER — ACETAMINOPHEN 325 MG PO TABS
650.0000 mg | ORAL_TABLET | Freq: Once | ORAL | Status: AC
  Administered 2022-06-20: 650 mg via ORAL
  Filled 2022-06-20: qty 2

## 2022-06-20 MED ORDER — PALONOSETRON HCL INJECTION 0.25 MG/5ML
0.2500 mg | Freq: Once | INTRAVENOUS | Status: AC
  Administered 2022-06-20: 0.25 mg via INTRAVENOUS
  Filled 2022-06-20: qty 5

## 2022-06-20 MED ORDER — SODIUM CHLORIDE 0.9 % IV SOLN
450.0000 mg | Freq: Once | INTRAVENOUS | Status: AC
  Administered 2022-06-20: 450 mg via INTRAVENOUS
  Filled 2022-06-20: qty 45

## 2022-06-20 MED ORDER — SODIUM CHLORIDE 0.9 % IV SOLN
420.0000 mg | Freq: Once | INTRAVENOUS | Status: AC
  Administered 2022-06-20: 420 mg via INTRAVENOUS
  Filled 2022-06-20: qty 14

## 2022-06-20 MED ORDER — SODIUM CHLORIDE 0.9 % IV SOLN
10.0000 mg | Freq: Once | INTRAVENOUS | Status: AC
  Administered 2022-06-20: 10 mg via INTRAVENOUS
  Filled 2022-06-20: qty 10

## 2022-06-20 MED ORDER — SODIUM CHLORIDE 0.9% FLUSH
10.0000 mL | Freq: Once | INTRAVENOUS | Status: AC
  Administered 2022-06-20: 10 mL

## 2022-06-20 NOTE — Patient Instructions (Signed)
[facility]  Discharge Instructions: Thank you for choosing [facility] to provide your oncology and hematology care.   If you have a lab appointment with the [facility], please go directly to the [facility] and check in at the registration area.   Wear comfortable clothing and clothing appropriate for easy access to any Portacath or PICC line.   We strive to give you quality time with your provider. You may need to reschedule your appointment if you arrive late (15 or more minutes).  Arriving late affects you and other patients whose appointments are after yours.  Also, if you miss three or more appointments without notifying the office, you may be dismissed from the clinic at the provider's discretion.      For prescription refill requests, have your pharmacy contact our office and allow 72 hours for refills to be completed.    Today you received the following chemotherapy and/or immunotherapy agents: Og[place]ri/Perjeta/Docetaxel/Carboplatin      To help prevent nausea and vomiting after your treatment, we encourage you to take your nausea medication as directed.  BELOW ARE SYMPTOMS THAT SHOULD BE REPORTED IMMEDIATELY: *FEVER GREATER THAN 100.4 F (38 C) OR HIGHER *CHILLS OR SWEATING *NAUSEA AND VOMITING THAT IS NOT CONTROLLED WITH YOUR NAUSEA MEDICATION *UNUSUAL SHORTNESS OF BREATH *UNUSUAL BRUISING OR BLEEDING *URINARY PROBLEMS (pain or burning when urinating, or frequent urination) *BOWEL PROBLEMS (unusual diarrhea, constipation, pain near the anus) TENDERNESS IN MOUTH AND THROAT WITH OR WITHOUT PRESENCE OF ULCERS (sore throat, sores in mouth, or a toothache) UNUSUAL RASH, SWELLING OR PAIN  UNUSUAL VAGINAL DISCHARGE OR ITCHING   Items with * indicate a potential emergency and should be followed up as soon as possible or go to the Emergency Department if any problems should occur.  Please show the CHEMOTHERAPY ALERT CARD or IMMUNOTHERAPY  ALERT CARD at check-in to the Emergency Department and triage nurse.  Should you have questions after your visit or need to cancel or reschedule your appointment, please contact [facility]  Dept: [phone]  and follow the prompts.  Office hours are 8:00 a.m. to 4:30 p.m. Monday - Friday. Please note that voicemails left after 4:00 p.m. may not be returned until the following business day.  We are closed weekends and major holidays. You have access to a nurse at all times for urgent questions. Please call the main number to the clinic Dept: [phone] and follow the prompts.   For any non-urgent questions, you may also contact your provider using MyChart. We now offer e-Visits for anyone 94 and older to request care online for non-urgent symptoms. For details visit [email or web address].   Also download the MyChart app! Go to the app store, search "MyChart", open the app, select [facility], and log in with your MyChart username and password.  Masks are optional in the cancer centers. If you would like for your care team to wear a mask while they are taking care of you, please let them know. For doctor visits, patients may have with them one support person who is at least 67 years old. At this time, visitors are not allowed in the infusion area.

## 2022-06-20 NOTE — Progress Notes (Signed)
Per Dr. Burr Medico OK to trt today w/ ECHO from 03/05/22 left EF 55-60%

## 2022-06-20 NOTE — Addendum Note (Signed)
Addended by: Truitt Merle on: 06/20/2022 09:49 AM   Modules accepted: Orders

## 2022-06-20 NOTE — Progress Notes (Addendum)
[facility]   [phone] [phone]) [phone]   Clinic Follow up Note   Patient Care Team: Rankins, Bill Salinas, MD as PCP - General (Family Medicine) Leonie Man, MD as PCP - Cardiology (Cardiology) Mauro Kaufmann, RN as Oncology Nurse Navigator Rockwell Germany, RN as Oncology Nurse Navigator Rolm Bookbinder, MD as Consulting Physician (General Surgery) Truitt Merle, MD as Consulting Physician (Hematology) Kyung Rudd, MD as Consulting Physician (Radiation Oncology)  Date of Service:  06/20/2022  CHIEF COMPLAINT: f/u of right breast cancer  CURRENT THERAPY:  Neoadjuvant TCHP, q21d, started 03/07/22  ASSESSMENT & PLAN:  Ashley Robertson is a 66 y.o. post-menopausal female with   1. Malignant neoplasm of overlapping sites of right breast, invasive lobular carcinoma, Stage IIIA, c(T3, N1), ER+/PR-/HER2+, Grade 2  -presented with new right breast mass. Right MM and [place] on 02/08/22 showed malignancy throughout right breast with discrete masses at 11 and 6 o'clock, and one borderline lymph node. Biopsy on 02/09/22 revealed invasive lobular carcinoma to both sites and metastatic carcinoma in lymph node. She has mass like hard tissue in most part of her right breast but no clinical concerns of inflammatory breast cancer, she also has a large palpable right axillary lymph node. -breast MRI on 03/01/22 showed: 8.5 cm area of confluent mass-like enhancement in right breast involving all 4 quadrants; biopsy-proven metastatic lymph node in right axilla; possible metastatic intramammary lymph node in posterior outer right breast; no evidence of malignancy on left. -staging CT CAP and bone scan on 03/05/22 were negative for metastatic disease. -baseline echo on 03/05/22 showed EF of 55-60%, normal. -she began neoadjuvant TCHP on 03/07/22. She tolerates very well with no major side effects.  -right breast US on 05/14/22 showed grossly stable appearance of irregular 2.9 cm mass at 11  o'clock, though exact measurements difficult due to diffuse nature of mass. Clinical exam 05/30/22 showed stable large central right breast mass. She is scheduled for post-treatment MRI on 06/27/22 -she continues to tolerate well. Labs reviewed, overall stable. Adequate to proceed with final chemo today. -We will continue maintenance Perjeta for now.  If her surgical pathology showed residual cancer, I will switch to [place], I discussed with her and she is open to that. Will keep her port for now.  -We also briefly reviewed the role of adjuvant radiation and antiestrogen therapy, she is agreeable. -she is scheduled for right mastectomy on 07/26/22 with Dr. Donne Hazel and reconstruction with Dr. Iran Planas.   2. Genetics -she has a family history of breast cancer in a paternal aunt and a niece, liver cancer in her maternal grandfather, and prostate cancer in her father. -testing performed on 02/21/22 was negative, with a VUS in [place]   3. Bone Health  -Her most recent DEXA was on 04/29/20 showing osteopenia (T-score of -2.1 at L1-3).   4. COPD and Smoking Cessation -she reports she quit smoking on 03/01/22. She notes she is currently using a non-nicotine vape, which has aided her in quitting.   5. HTN and prediabetes, anxiety -Continue medication, and follow-up with PCP -We will follow-up her blood pressure and blood glucose closely during her chemotherapy     PLAN:  -Proceed with final TCHP today.              -GCF-S injection on day 3 -post-treatment breast MRI on 7/26, and she will see Dr. Donne Hazel this Friday  -lab, flush, and herceptin/perjeta in 3 and 6 weeks -mastectomy on 8/24 -f/u in  6 weeks -repeat echo in 2-3 weeks   No problem-specific Assessment & Plan notes found for this encounter.   SUMMARY OF ONCOLOGIC HISTORY: Oncology History Overview Note   Cancer Staging  Malignant neoplasm of overlapping sites of right breast in female, estrogen receptor positive ([place]) Staging  form: Breast, AJCC 8th Edition - Clinical stage from 02/09/2022: Stage IIA (cT2, cN1, cM0, G2, ER+, PR-, HER2+) - Signed by Truitt Merle, MD on 02/20/2022    Malignant neoplasm of overlapping sites of right breast in female, estrogen receptor positive ([place])  02/08/2022 Mammogram   CLINICAL DATA:  Acute onset right breast lump.  EXAM: DIGITAL DIAGNOSTIC UNILATERAL RIGHT MAMMOGRAM WITH TOMOSYNTHESIS AND CAD; ULTRASOUND RIGHT BREAST LIMITED  IMPRESSION: 1. Highly suspicious findings mammographically and sonographically. I suspect there is significant malignancy throughout most of the right breast. Discrete masses are seen at 11 o'clock and 6 o'clock.  A single borderline lymph node is identified. This lymph node appears prominent compared to the remainder of the lymph nodes. The skin thickening suggests the possibility of inflammatory breast cancer.   02/09/2022 Cancer Staging   Staging form: Breast, AJCC 8th Edition - Clinical stage from 02/09/2022: Stage IIIA (cT3, cN1, cM0, G2, ER+, PR-, HER2+) - Signed by Truitt Merle, MD on 03/05/2022 Stage prefix: Initial diagnosis Histologic grading system: 3 grade system   02/09/2022 Initial Biopsy   Diagnosis 1. Breast, right, needle core biopsy, right breast 11 o'clock mass ribbon clip - INVASIVE MAMMARY CARCINOMA - SEE COMMENT 2. Breast, right, needle core biopsy, right breast 6'oclock mass, coil clip - INVASIVE MAMMARY CARCINOMA - SEE COMMENT 3. Lymph node, needle/core biopsy, right axillary lymph node, tribell clip - METASTATIC CARCINOMA INVOLVING NODAL TISSUE Microscopic Comment 1. and 2. The biopsy material shows an infiltrative proliferation of cells arranged linearly and in small clusters. Based on the biopsy, the carcinoma appears Nottingham grade 2 of 3 and measures 1.5 cm in greatest linear extent.  Addendum parts 1 and 2: Immunohistochemistry for E-cadherin is negative consistent with lobular carcinoma.  2. PROGNOSTIC INDICATORS Results: The  tumor cells are EQUIVOCAL for Her2 (2+). Her2 by FISH will be performed and the results reported separately. Estrogen Receptor: 100%, POSITIVE, STRONG STAINING INTENSITY Progesterone Receptor: <1%, NEGATIVE Proliferation Marker Ki67: 10%  2. FLUORESCENCE IN-SITU HYBRIDIZATION Results: GROUP 1: HER2 **POSITIVE**   3. PROGNOSTIC INDICATORS Results: By immunohistochemistry, the tumor cells are POSITIVE for Her2 (3+). Estrogen Receptor: 100%, POSITIVE, STRONG STAINING INTENSITY Progesterone Receptor: <1%, NEGATIVE   02/16/2022 Initial Diagnosis   Malignant neoplasm of overlapping sites of right breast in female, estrogen receptor positive ([place])   03/01/2022 Imaging   EXAM: BILATERAL BREAST MRI WITH AND WITHOUT CONTRAST  IMPRESSION: 1. 8.5 x 8.3 x 5.9 cm area of confluent mass-like enhancement in the right breast involving all 4 quadrants and containing 2 biopsy marker clip artifacts, compatible with 4 quadrant biopsy-proven malignancy. 2. Biopsy-proven metastatic lymph node in the right axilla. 3. Possible metastatic intramammary lymph node in the posterior outer right breast just above the level of the nipple. 4. No evidence of malignancy on the left.   03/01/2022 Genetic Testing   Negative hereditary cancer genetic testing: no pathogenic variants detected in [facility] CustomNext-Cancer +RNAinsight Panel.  Variant of uncertain significance reported in BRIP1 at p.F934V (c.2800T>G). Report date is March 01, 2022.    The CustomNext-Cancer+RNAinsight panel offered by [facility] includes sequencing and rearrangement analysis for the following 47 genes:  APC, ATM, AXIN2, BARD1, BMPR1A, BRCA1, BRCA2, BRIP1, CDH1, CDK4,  CDKN2A, CHEK2, DICER1, EPCAM, GREM1, HOXB13, MEN1, MLH1, MSH2, MSH3, MSH6, MUTYH, NBN, NF1, NF2, NTHL1, PALB2, PMS2, POLD1, POLE, PTEN, RAD51C, RAD51D, RECQL, RET, SDHA, SDHAF2, SDHB, SDHC, SDHD, SMAD4, SMARCA4, STK11, TP53, TSC1, TSC2, and VHL.  RNA data is routinely analyzed  for use in variant interpretation for all genes.   03/05/2022 Imaging   EXAM: CT CHEST, ABDOMEN, AND PELVIS WITH CONTRAST  IMPRESSION: 1. Asymmetric, masslike density of the glandular tissue of the right breast with overlying skin thickening, in keeping with known primary breast malignancy. 2. Enlarged, ill-defined right axillary lymph node containing a biopsy marking clip, consistent with known nodal metastatic disease. 3. No other evidence of lymphadenopathy or metastatic disease in the chest, abdomen, or pelvis. 4. Emphysema. Background of fine centrilobular nodularity, most concentrated in the lung apices, consistent with smoking-related respiratory bronchiolitis. 5. Aneurysm of the infrarenal abdominal aorta measuring up to 5.4 x 5.3 cm with a large burden of eccentric mural thrombus. Recommend follow-up CT/MR every 6 months and vascular consultation. This recommendation follows ACR consensus guidelines: White Paper of the ACR Incidental Findings Committee II on Vascular Findings. J Am Coll Radiol 2013; 10:789-794. 6. Coronary artery disease.   03/05/2022 Imaging   EXAM: NUCLEAR MEDICINE WHOLE BODY BONE SCAN  IMPRESSION: No evidence of bony metastatic disease.   03/07/2022 -  Chemotherapy   Patient is on Treatment Plan : BREAST  Docetaxel + Carboplatin + Trastuzumab + Pertuzumab  (TCHP) q21d         INTERVAL HISTORY:  LAURIEANN FRIDDLE is here for a follow up of breast cancer. She was last seen by me on 05/30/22. She presents to the clinic alone. She reports she continues to tolerate chemo very well. She notes she has not needed to use the nausea medicines.   All other systems were reviewed with the patient and are negative.  MEDICAL HISTORY:  Past Medical History:  Diagnosis Date   Allergy    Lisinopril and lipitor   Anxiety and depression    Aortic atherosclerosis ([place]) 03/2020   CT Chest: 2 V (LAD & LCx) Coronary Atherosclerosis, Aortic Atherosclerosis (no aneurysm).  Mild  centrilobular emphysema with mild diffuse bronchial thickening; several scattered small solitary pulmonary nodules (largest 5.6 mm in anterior left upper lobe)   Breast cancer ([place]) 12/2021   Right breast ILC   Carotid artery plaque, bilateral 10/2015   Mild to moderate plaque L>R without significant stenosis   COPD (chronic obstructive pulmonary disease) ([facility])    Coronary Artery Calcification - Score 79    Coronary Calcium Score 79.  LAD and circumflex calcification noted.  Normal ascending aorta with mild calcification.   Current every day smoker    Emphysema lung ([place])    Noted on chest CT   Family history of breast cancer 02/21/2022   Family history of prostate cancer 02/21/2022   Hyperlipidemia    Hypertension    Controlled with amlodipine   Prediabetes    Skin cancer 2021   Remove 2022    SURGICAL HISTORY: Past Surgical History:  Procedure Laterality Date   CESAREAN SECTION  1990, [place] Left 03/06/2022   Procedure: INSERTION PORT-A-CATH;  Surgeon: Rolm Bookbinder, MD;  Location: [facility];  Service: General;  Laterality: Left;   [place] ECHOCARDIOGRAM  10/2016   EF 55 to 60%.  Normal systolic and diastolic function.  No ASD/PFO   TUBAL LIGATION  1991    I have reviewed the social history and family history with the patient and they are unchanged from previous note.  ALLERGIES:  is allergic to lisinopril and lipitor [atorvastatin].  MEDICATIONS:  Current Outpatient Medications  Medication Sig Dispense Refill   albuterol (PROVENTIL HFA) 108 (90 Base) MCG/ACT inhaler Inhale 1 to 2 puffs into the lungs every 6 hours as needed 8.5 g 2   amLODipine (NORVASC) 10 MG tablet TAKE 1 TABLET BY MOUTH AT BEDTIME--need office visit for further refills 15 tablet 0   aspirin 81 MG tablet Take 81 mg by mouth daily.     Calcium Carb-Cholecalciferol (CALCIUM 500  + D3 PO) Take 1 tablet by mouth daily.     Cholecalciferol (VITAMIN D) 50 MCG (2000 UT) CAPS Take by mouth.     dexamethasone (DECADRON) 4 MG tablet Take one tablet twice daily the day before Taxotere, then take 1 tablet daily for 3 days after chemotherapy. 30 tablet 0   FLUoxetine (PROZAC) 20 MG capsule Take 1 capsule by mouth daily. 90 capsule 1   Fluticasone-Umeclidin-Vilant (TRELEGY ELLIPTA) 200-62.5-25 MCG/ACT AEPB Inhale 1 puff into the lungs daily 60 each 2   lidocaine-prilocaine (EMLA) cream Apply to affected area once 30 g 3   ondansetron (ZOFRAN) 8 MG tablet Take 1 tablet by mouth 2 (two) times daily as needed for nausea or vomiting. Start on the third day after chemotherapy. 30 tablet 1   OVER THE COUNTER MEDICATION Serrapeptase 120,000 units dailyReported on 12/27/2015     prochlorperazine (COMPAZINE) 10 MG tablet Take 1 tablet (10 mg total) by mouth every 6 (six) hours as needed for nausea or vomiting. 30 tablet 1   rosuvastatin (CRESTOR) 5 MG tablet Take 1 tablet by mouth daily at bedtime ,except on Monday, Wednesday and Fridays take 2 tablets at bedtime as directed 180 tablet 3   Specialty Vitamins Products (MAGNESIUM, AMINO ACID CHELATE,) 133 MG tablet Take 1 tablet by mouth at bedtime. PURE magnesium supplement - 120 mg of mag per capsule     traMADol (ULTRAM) 50 MG tablet Take 1 tablet (50 mg total) by mouth every 6 (six) hours as needed. 10 tablet 0   No current facility-administered medications for this visit.    PHYSICAL EXAMINATION: ECOG PERFORMANCE STATUS: 1 - Symptomatic but completely ambulatory  Vitals:   06/20/22 0900  BP: (!) 149/77  Pulse: 65  Resp: 18  Temp: 98.4 F (36.9 C)  SpO2: 98%   Wt Readings from Last 3 Encounters:  06/20/22 161 lb 4.8 oz (73.2 kg)  05/30/22 158 lb (71.7 kg)  05/09/22 155 lb 4.8 oz (70.4 kg)     GENERAL:alert, no distress and comfortable SKIN: skin color normal, no rashes or significant lesions EYES: normal, Conjunctiva are  pink and non-injected, sclera clear  NEURO: alert & oriented x 3 with fluent speech  LABORATORY DATA:  I have reviewed the data as listed    Latest Ref Rng & Units 06/20/2022    8:49 AM 05/30/2022    9:16 AM 05/09/2022    9:52 AM  CBC  WBC 4.0 - 10.5 K/uL 10.3  8.1  12.5   Hemoglobin 12.0 - 15.0 g/dL 10.6  10.5  11.0   Hematocrit 36.0 - 46.0 % 30.7  30.7  32.3   Platelets 150 - 400 K/uL 298  310  283         Latest Ref Rng & Units 06/20/2022  8:49 AM 05/30/2022    9:16 AM 05/09/2022    9:52 AM  CMP  Glucose 70 - 99 mg/dL 137  133  129   BUN 8 - 23 mg/dL '15  18  14   ' Creatinine 0.44 - 1.00 mg/dL 0.50  0.59  0.49   Sodium 135 - 145 mmol/L 136  135  138   Potassium 3.5 - 5.1 mmol/L 4.1  4.5  4.2   Chloride 98 - 111 mmol/L 104  104  104   CO2 22 - 32 mmol/L '27  26  28   ' Calcium 8.9 - 10.3 mg/dL 9.3  9.2  9.5   Total Protein 6.5 - 8.1 g/dL 6.5  6.4  6.6   Total Bilirubin 0.3 - 1.2 mg/dL 0.3  0.3  0.3   Alkaline Phos 38 - 126 U/L 90  88  87   AST 15 - 41 U/L '14  15  14   ' ALT 0 - 44 U/L '12  12  12       ' RADIOGRAPHIC STUDIES: I have personally reviewed the radiological images as listed and agreed with the findings in the report. No results found.    No orders of the defined types were placed in this encounter.  All questions were answered. The patient knows to call the clinic with any problems, questions or concerns. No barriers to learning was detected. The total time spent in the appointment was 30 minutes.     Truitt Merle, MD 06/20/2022   I, Wilburn Mylar, am acting as scribe for Truitt Merle, MD.   I have reviewed the above documentation for accuracy and completeness, and I agree with the above.

## 2022-06-20 NOTE — Addendum Note (Signed)
Addended by: Truitt Merle on: 06/20/2022 08:26 PM   Modules accepted: Orders

## 2022-06-21 ENCOUNTER — Other Ambulatory Visit (HOSPITAL_COMMUNITY): Payer: Self-pay

## 2022-06-21 ENCOUNTER — Telehealth: Payer: Self-pay | Admitting: Hematology

## 2022-06-21 ENCOUNTER — Other Ambulatory Visit: Payer: Self-pay | Admitting: Cardiology

## 2022-06-21 NOTE — Telephone Encounter (Signed)
Scheduled follow-up appointments per 7/19 los. Patient is aware.

## 2022-06-22 ENCOUNTER — Other Ambulatory Visit: Payer: Self-pay

## 2022-06-22 ENCOUNTER — Inpatient Hospital Stay: Payer: PPO

## 2022-06-22 ENCOUNTER — Other Ambulatory Visit: Payer: Self-pay | Admitting: Cardiology

## 2022-06-22 ENCOUNTER — Other Ambulatory Visit (HOSPITAL_COMMUNITY): Payer: Self-pay

## 2022-06-22 ENCOUNTER — Telehealth: Payer: Self-pay | Admitting: Cardiology

## 2022-06-22 VITALS — BP 134/76 | HR 63 | Temp 98.4°F | Resp 18

## 2022-06-22 DIAGNOSIS — C50811 Malignant neoplasm of overlapping sites of right female breast: Secondary | ICD-10-CM | POA: Diagnosis not present

## 2022-06-22 DIAGNOSIS — Z170 Estrogen receptor positive status [ER+]: Secondary | ICD-10-CM

## 2022-06-22 DIAGNOSIS — Z5112 Encounter for antineoplastic immunotherapy: Secondary | ICD-10-CM | POA: Diagnosis not present

## 2022-06-22 MED ORDER — PEGFILGRASTIM-CBQV 6 MG/0.6ML ~~LOC~~ SOSY
6.0000 mg | PREFILLED_SYRINGE | Freq: Once | SUBCUTANEOUS | Status: AC
  Administered 2022-06-22: 6 mg via SUBCUTANEOUS
  Filled 2022-06-22: qty 0.6

## 2022-06-22 MED ORDER — AMLODIPINE BESYLATE 10 MG PO TABS
10.0000 mg | ORAL_TABLET | Freq: Every day | ORAL | 0 refills | Status: DC
  Filled 2022-06-22: qty 15, 15d supply, fill #0

## 2022-06-22 NOTE — Patient Instructions (Signed)

## 2022-06-22 NOTE — Telephone Encounter (Addendum)
*  STAT* If patient is at the pharmacy, call can be transferred to refill team.   1. Which medications need to be refilled? (please list name of each medication and dose if known) amLODipine (NORVASC) 10 MG tablet  2. Which pharmacy/location (including street and city if local pharmacy) is medication to be sent to? [facility]  3. Do they need a 30 day or 90 day supply? 90 day  Patient has appt scheduled in July 27th.

## 2022-06-23 ENCOUNTER — Other Ambulatory Visit (HOSPITAL_COMMUNITY): Payer: Self-pay

## 2022-06-25 ENCOUNTER — Other Ambulatory Visit: Payer: Self-pay

## 2022-06-27 ENCOUNTER — Other Ambulatory Visit: Payer: PPO

## 2022-06-28 ENCOUNTER — Ambulatory Visit: Payer: PPO | Admitting: Physician Assistant

## 2022-06-28 ENCOUNTER — Encounter: Payer: Self-pay | Admitting: *Deleted

## 2022-06-28 ENCOUNTER — Other Ambulatory Visit (HOSPITAL_COMMUNITY): Payer: PPO

## 2022-07-04 ENCOUNTER — Ambulatory Visit (HOSPITAL_COMMUNITY)
Admission: RE | Admit: 2022-07-04 | Discharge: 2022-07-04 | Disposition: A | Discharge status: Home / Self Care | Payer: PPO | Source: Ambulatory Visit | Attending: Hematology | Admitting: Hematology

## 2022-07-04 DIAGNOSIS — I2510 Atherosclerotic heart disease of native coronary artery without angina pectoris: Secondary | ICD-10-CM | POA: Insufficient documentation

## 2022-07-04 DIAGNOSIS — E785 Hyperlipidemia, unspecified: Secondary | ICD-10-CM | POA: Insufficient documentation

## 2022-07-04 DIAGNOSIS — J439 Emphysema, unspecified: Secondary | ICD-10-CM | POA: Diagnosis not present

## 2022-07-04 DIAGNOSIS — J449 Chronic obstructive pulmonary disease, unspecified: Secondary | ICD-10-CM | POA: Insufficient documentation

## 2022-07-04 DIAGNOSIS — Z87891 Personal history of nicotine dependence: Secondary | ICD-10-CM | POA: Insufficient documentation

## 2022-07-04 DIAGNOSIS — Z0189 Encounter for other specified special examinations: Secondary | ICD-10-CM

## 2022-07-04 DIAGNOSIS — I10 Essential (primary) hypertension: Secondary | ICD-10-CM | POA: Insufficient documentation

## 2022-07-04 DIAGNOSIS — Z170 Estrogen receptor positive status [ER+]: Secondary | ICD-10-CM | POA: Diagnosis not present

## 2022-07-04 DIAGNOSIS — C50811 Malignant neoplasm of overlapping sites of right female breast: Secondary | ICD-10-CM | POA: Insufficient documentation

## 2022-07-04 LAB — ECHOCARDIOGRAM COMPLETE
Area-P 1/2: 5.46 cm2
S' Lateral: 4 cm

## 2022-07-04 NOTE — Progress Notes (Signed)
  Echocardiogram 2D Echocardiogram has been performed.  Ashley Robertson M 07/04/2022, 9:52 AM

## 2022-07-09 ENCOUNTER — Other Ambulatory Visit: Payer: Self-pay | Admitting: Cardiology

## 2022-07-10 ENCOUNTER — Other Ambulatory Visit (HOSPITAL_COMMUNITY): Payer: Self-pay

## 2022-07-10 MED ORDER — AMLODIPINE BESYLATE 10 MG PO TABS
10.0000 mg | ORAL_TABLET | Freq: Every day | ORAL | 0 refills | Status: DC
  Filled 2022-07-10: qty 15, 15d supply, fill #0

## 2022-07-11 ENCOUNTER — Inpatient Hospital Stay: Payer: PPO

## 2022-07-11 ENCOUNTER — Inpatient Hospital Stay: Payer: PPO | Attending: Hematology

## 2022-07-11 ENCOUNTER — Other Ambulatory Visit: Payer: Self-pay

## 2022-07-11 VITALS — BP 173/83 | HR 60 | Temp 98.3°F | Resp 18 | Wt 160.5 lb

## 2022-07-11 DIAGNOSIS — F419 Anxiety disorder, unspecified: Secondary | ICD-10-CM | POA: Diagnosis not present

## 2022-07-11 DIAGNOSIS — Z95828 Presence of other vascular implants and grafts: Secondary | ICD-10-CM

## 2022-07-11 DIAGNOSIS — I7143 Infrarenal abdominal aortic aneurysm, without rupture: Secondary | ICD-10-CM | POA: Diagnosis not present

## 2022-07-11 DIAGNOSIS — J432 Centrilobular emphysema: Secondary | ICD-10-CM | POA: Insufficient documentation

## 2022-07-11 DIAGNOSIS — C50811 Malignant neoplasm of overlapping sites of right female breast: Secondary | ICD-10-CM | POA: Insufficient documentation

## 2022-07-11 DIAGNOSIS — Z9011 Acquired absence of right breast and nipple: Secondary | ICD-10-CM | POA: Insufficient documentation

## 2022-07-11 DIAGNOSIS — C773 Secondary and unspecified malignant neoplasm of axilla and upper limb lymph nodes: Secondary | ICD-10-CM | POA: Insufficient documentation

## 2022-07-11 DIAGNOSIS — R7303 Prediabetes: Secondary | ICD-10-CM | POA: Insufficient documentation

## 2022-07-11 DIAGNOSIS — Z79899 Other long term (current) drug therapy: Secondary | ICD-10-CM | POA: Diagnosis not present

## 2022-07-11 DIAGNOSIS — Z5112 Encounter for antineoplastic immunotherapy: Secondary | ICD-10-CM | POA: Insufficient documentation

## 2022-07-11 DIAGNOSIS — Z170 Estrogen receptor positive status [ER+]: Secondary | ICD-10-CM | POA: Diagnosis not present

## 2022-07-11 DIAGNOSIS — Z7982 Long term (current) use of aspirin: Secondary | ICD-10-CM | POA: Insufficient documentation

## 2022-07-11 DIAGNOSIS — I10 Essential (primary) hypertension: Secondary | ICD-10-CM | POA: Diagnosis not present

## 2022-07-11 LAB — CBC WITH DIFFERENTIAL (CANCER CENTER ONLY)
Abs Immature Granulocytes: 0.03 10*3/uL (ref 0.00–0.07)
Basophils Absolute: 0.1 10*3/uL (ref 0.0–0.1)
Basophils Relative: 1 %
Eosinophils Absolute: 0 10*3/uL (ref 0.0–0.5)
Eosinophils Relative: 0 %
HCT: 34.4 % — ABNORMAL LOW (ref 36.0–46.0)
Hemoglobin: 11.7 g/dL — ABNORMAL LOW (ref 12.0–15.0)
Immature Granulocytes: 0 %
Lymphocytes Relative: 10 %
Lymphs Abs: 0.7 10*3/uL (ref 0.7–4.0)
MCH: 34.9 pg — ABNORMAL HIGH (ref 26.0–34.0)
MCHC: 34 g/dL (ref 30.0–36.0)
MCV: 102.7 fL — ABNORMAL HIGH (ref 80.0–100.0)
Monocytes Absolute: 0.8 10*3/uL (ref 0.1–1.0)
Monocytes Relative: 11 %
Neutro Abs: 5.9 10*3/uL (ref 1.7–7.7)
Neutrophils Relative %: 78 %
Platelet Count: 298 10*3/uL (ref 150–400)
RBC: 3.35 MIL/uL — ABNORMAL LOW (ref 3.87–5.11)
RDW: 13.7 % (ref 11.5–15.5)
WBC Count: 7.6 10*3/uL (ref 4.0–10.5)
nRBC: 0 % (ref 0.0–0.2)

## 2022-07-11 LAB — CMP (CANCER CENTER ONLY)
ALT: 10 U/L (ref 0–44)
AST: 13 U/L — ABNORMAL LOW (ref 15–41)
Albumin: 4 g/dL (ref 3.5–5.0)
Alkaline Phosphatase: 98 U/L (ref 38–126)
Anion gap: 6 (ref 5–15)
BUN: 14 mg/dL (ref 8–23)
CO2: 28 mmol/L (ref 22–32)
Calcium: 9.1 mg/dL (ref 8.9–10.3)
Chloride: 103 mmol/L (ref 98–111)
Creatinine: 0.57 mg/dL (ref 0.44–1.00)
GFR, Estimated: >60 mL/min (ref >60)
Glucose, Bld: 118 mg/dL — ABNORMAL HIGH (ref 70–99)
Potassium: 4.2 mmol/L (ref 3.5–5.1)
Sodium: 137 mmol/L (ref 135–145)
Total Bilirubin: 0.3 mg/dL (ref 0.3–1.2)
Total Protein: 6.9 g/dL (ref 6.5–8.1)

## 2022-07-11 MED ORDER — HEPARIN SOD (PORK) LOCK FLUSH 100 UNIT/ML IV SOLN
500.0000 [IU] | Freq: Once | INTRAVENOUS | Status: AC | PRN
  Administered 2022-07-11: 500 [IU]

## 2022-07-11 MED ORDER — DIPHENHYDRAMINE HCL 25 MG PO CAPS
50.0000 mg | ORAL_CAPSULE | Freq: Once | ORAL | Status: AC
  Administered 2022-07-11: 50 mg via ORAL

## 2022-07-11 MED ORDER — ACETAMINOPHEN 325 MG PO TABS
650.0000 mg | ORAL_TABLET | Freq: Once | ORAL | Status: AC
  Administered 2022-07-11: 650 mg via ORAL

## 2022-07-11 MED ORDER — ACETAMINOPHEN 325 MG PO TABS
ORAL_TABLET | ORAL | Status: AC
  Filled 2022-07-11: qty 2

## 2022-07-11 MED ORDER — SODIUM CHLORIDE 0.9% FLUSH
10.0000 mL | Freq: Once | INTRAVENOUS | Status: AC
  Administered 2022-07-11: 10 mL

## 2022-07-11 MED ORDER — SODIUM CHLORIDE 0.9 % IV SOLN: Freq: Once | INTRAVENOUS | Status: AC

## 2022-07-11 MED ORDER — SODIUM CHLORIDE 0.9 % IV SOLN
420.0000 mg | Freq: Once | INTRAVENOUS | Status: AC
  Administered 2022-07-11: 420 mg via INTRAVENOUS
  Filled 2022-07-11: qty 14

## 2022-07-11 MED ORDER — DIPHENHYDRAMINE HCL 25 MG PO CAPS
ORAL_CAPSULE | ORAL | Status: AC
  Filled 2022-07-11: qty 2

## 2022-07-11 MED ORDER — TRASTUZUMAB-DKST CHEMO 150 MG IV SOLR
6.0000 mg/kg | Freq: Once | INTRAVENOUS | Status: AC
  Administered 2022-07-11: 420 mg via INTRAVENOUS
  Filled 2022-07-11: qty 20

## 2022-07-11 MED ORDER — SODIUM CHLORIDE 0.9% FLUSH
10.0000 mL | INTRAVENOUS | Status: DC | PRN
  Administered 2022-07-11: 10 mL

## 2022-07-11 NOTE — Patient Instructions (Addendum)
[facility]  Discharge Instructions: Thank you for choosing [facility] to provide your oncology and hematology care.   If you have a lab appointment with the [facility], please go directly to the [facility] and check in at the registration area.   Wear comfortable clothing and clothing appropriate for easy access to any Portacath or PICC line.   We strive to give you quality time with your provider. You may need to reschedule your appointment if you arrive late (15 or more minutes).  Arriving late affects you and other patients whose appointments are after yours.  Also, if you miss three or more appointments without notifying the office, you may be dismissed from the clinic at the provider's discretion.      For prescription refill requests, have your pharmacy contact our office and allow 72 hours for refills to be completed.    Today you received the following chemotherapy and/or immunotherapy agents: trastuzumab-dkst, pertuzumab      To help prevent nausea and vomiting after your treatment, we encourage you to take your nausea medication as directed.  BELOW ARE SYMPTOMS THAT SHOULD BE REPORTED IMMEDIATELY: *FEVER GREATER THAN 100.4 F (38 C) OR HIGHER *CHILLS OR SWEATING *NAUSEA AND VOMITING THAT IS NOT CONTROLLED WITH YOUR NAUSEA MEDICATION *UNUSUAL SHORTNESS OF BREATH *UNUSUAL BRUISING OR BLEEDING *URINARY PROBLEMS (pain or burning when urinating, or frequent urination) *BOWEL PROBLEMS (unusual diarrhea, constipation, pain near the anus) TENDERNESS IN MOUTH AND THROAT WITH OR WITHOUT PRESENCE OF ULCERS (sore throat, sores in mouth, or a toothache) UNUSUAL RASH, SWELLING OR PAIN  UNUSUAL VAGINAL DISCHARGE OR ITCHING   Items with * indicate a potential emergency and should be followed up as soon as possible or go to the Emergency Department if any problems should occur.  Please show the CHEMOTHERAPY ALERT CARD or IMMUNOTHERAPY ALERT  CARD at check-in to the Emergency Department and triage nurse.  Should you have questions after your visit or need to cancel or reschedule your appointment, please contact [facility]  Dept: [phone]  and follow the prompts.  Office hours are 8:00 a.m. to 4:30 p.m. Monday - Friday. Please note that voicemails left after 4:00 p.m. may not be returned until the following business day.  We are closed weekends and major holidays. You have access to a nurse at all times for urgent questions. Please call the main number to the clinic Dept: [phone] and follow the prompts.   For any non-urgent questions, you may also contact your provider using MyChart. We now offer e-Visits for anyone 53 and older to request care online for non-urgent symptoms. For details visit [email or web address].   Also download the MyChart app! Go to the app store, search "MyChart", open the app, select [facility], and log in with your MyChart username and password.  Masks are optional in the cancer centers. If you would like for your care team to wear a mask while they are taking care of you, please let them know. You may have one support person who is at least 67 years old accompany you for your appointments.

## 2022-07-12 ENCOUNTER — Other Ambulatory Visit (HOSPITAL_COMMUNITY): Payer: Self-pay

## 2022-07-12 LAB — CANCER ANTIGEN 27.29: CA 27.29: 26.4 U/mL (ref 0.0–38.6)

## 2022-07-12 MED ORDER — METHOCARBAMOL 500 MG PO TABS
ORAL_TABLET | ORAL | 0 refills | Status: DC
  Filled 2022-07-12: qty 15, 5d supply, fill #0

## 2022-07-12 MED ORDER — DOXYCYCLINE HYCLATE 100 MG PO TABS
ORAL_TABLET | ORAL | 0 refills | Status: DC
  Filled 2022-07-12: qty 12, 6d supply, fill #0

## 2022-07-12 NOTE — H&P (Signed)
Subjective:     Patient ID: Ashley Robertson is a 66 y.o. female.   HPI   Returns for follow up discussion breast reconstruction. Presented with palpable right breast mass. MMG/US showed mass right breast 11 o'clock, 6 cmfn measuring 2.1 x 3.3 x 2.0 cm.  A 6 o clock mass present, 3 cmfn measuring 1.2 x 1.1 x 2.6 cm. No definitive abnormal axillary LN noted, however, a single LN with 3.1 mm cortex present.   Biopsies labeled right breast 6 o clock and 11o clock both with ILC, ER+/PR-, Her2+. Biopsy of LN with metastatic disease.   MRI demonstrated 8.5 x 8.3 x 5.9 cm area of mass-like enhancement in the right breast involving all 4 quadrants and containing 2 biopsy marker clip artifacts. Biopsy-proven metastatic LN in the right axilla. Possible metastatic IM LN noted.   Staging scans negative for distant disease. Completed neoadjuvant chemotherapy. She will receive adjuvant radiation. [place] 05/14/2022 with similar size mass noted.   Genetics with VUS in BRIP1   Current 34 G with Soma bra, does not feel she is really that large. Happy with current size. Wt- lost 63 lb post retirement 2020 through intermittent fasting, 15 lb gain since start chemo.   Reports quit smoking 3.30.23. Patient is a retired Nature conservation officer. 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   PMH significant for HTN, HLD, pre DM, COPD.    Review of Systems      Objective:   Physical Exam Cardiovascular:     Rate and Rhythm: Normal rate and regular rhythm.     Heart sounds: Normal heart sounds.  Pulmonary:     Effort: Pulmonary effort is normal.     Breath sounds: Normal breath sounds.  Abdominal:     Comments: Small volume soft tissue for reconstruction  Skin:    Comments: Fitzpatrick 2     Port left chest Breasts: no ptosis, right breast firm throughout nearly all breast though softer than last exam and able to pinch some mobile soft tissue over mass Right NAC retraction SN to nipple R 24 L 26 cm BW R 20 L  20 cm CW 15 cm Nipple to IMF R 8 L 8 cm    Assessment:     Right breast cancer overlapping sites ER+ metastatic to LN Neoadjuvant chemotherapy    Plan:     Remains nicotine free.   Post treatment MRI deferred.   Plan right mastectomy with immediate tissue expander acellular dermis reconstruction. Patient is not candidate for NSM. Plan skin reduction pattern. Reviewed anchor type scars. Hold ASA NSAIDs week prior to surgery.   Reviewed immediate vs delayed, autologous vs implant based reconstruction. Reviewed incisions, drains, OR length, hospital stay and recovery for each. Discussed process of expansion and implant based risks including rupture, imaging surveillance for silicone implants, infection requiring surgery or removal, contracture.  If she desires purely autologous reconstruction recommend she consult with microsurgeon for other options as I feels she hs small volume abdomen for reconstruction. Counseled this would not be offered at time of mastectomy given anticipated RT. Reviewed risks mastectomy flap necrosis requiring additional surgery.  Reviewed asymmetry one can expect with unilateral implant based reconstruction.   Discussed future surgery dependent on adjuvant treatments. This includes radiation- discussed this significantly increases risk reconstruction including wound healing problems capsular contracture. Options would be to delay reconstruction and pursue LD + TE on radiated side or autologous. Alternative would be placement expanders at time  of mastectomy- the benefit of this would be to "have something there." However this would then mean expanders would be in place for several months (approximately 3-6 months from end of radiation) before continuing reconstruction process. At that time could do implant exchange alone, implant exchange with LD flap for radiated chest, or coversion to autologous.    Discussed use of acellular dermis in reconstruction, cadaveric source,  incorporation over several weeks, risk that if has seroma or infection can act as additional nidus for infection if not incorporated.    Discussed prepectoral vs sub pectoral reconstruction. Discussed with patient and benefit of this is no animation deformity, may be less pain. Risk may be more visible rippling over upper poles, greater need of ADM. Reviewed pre pectoral would require larger amount acellular dermis, more drains. Discussed any type reconstruction also risks long term displacement implant and visible rippling. If prepectoral counseled I would recommend she be comfortable with silicone implants as more options that have less rippling. She agrees to prepectoral placement.   Reviewed additional risks including but not limited to risks mastectomy flap necrosis requiring additional surgery, seroma, hematoma, asymmetry, need to additional procedures, fat necrosis, DVT/PE, damage to adjacent structures, cardiopulmonary complications.   Drain teaching sheet provided. Has tramadol at home. Rx for doxycycline and Robaxin given.

## 2022-07-17 ENCOUNTER — Other Ambulatory Visit: Payer: Self-pay | Admitting: General Surgery

## 2022-07-17 NOTE — Pre-Procedure Instructions (Signed)
Surgical Instructions    Your procedure is scheduled on Thursday, August 24th.  Report to [facility] Main Entrance "A" at 8:00 A.M., then check in with the Admitting office.  Call this number if you have problems the morning of surgery:  [phone]   If you have any questions prior to your surgery date call [phone]: Open Monday-Friday 8am-4pm    Remember:  Do not eat after midnight the night before your surgery  You may drink clear liquids until 7:00 a.m. the morning of your surgery.   Clear liquids allowed are: Water, Non-Citrus Juices (without pulp), Carbonated Beverages, Clear Tea, Black Coffee ONLY (NO MILK, CREAM OR POWDERED CREAMER of any kind), and Gatorade    Take these medicines the morning of surgery with A SIP OF WATER:  FLUoxetine (PROZAC)  Fluticasone-Umeclidin-Vilant (TRELEGY ELLIPTA)    Take these medications as needed: albuterol (PROVENTIL HFA)- Please bring all inhalers with you the day of surgery.  methocarbamol (ROBAXIN)   Follow your surgeon's instructions on when to stop Aspirin.  If no instructions were given by your surgeon then you will need to call the office to get those instructions.    As of today, STOP taking any Aleve, Naproxen, Ibuprofen, Motrin, Advil, Goody's, BC's, all herbal medications, fish oil, and all vitamins.           Do not wear jewelry or makeup. Do not wear lotions, powders, perfumes or deodorant. Do not shave 48 hours prior to surgery.   Do not bring valuables to the hospital. Do not wear nail polish, gel polish, artificial nails, or any other type of covering on natural nails (fingers and toes) If you have artificial nails or gel coating that need to be removed by a nail salon, please have this removed prior to surgery. Artificial nails or gel coating may interfere with anesthesia's ability to adequately monitor your vital signs.  [facility] is not responsible for any belongings or valuables.    Do NOT Smoke  (Tobacco/Vaping)  24 hours prior to your procedure  If you use a CPAP at night, you may bring your mask for your overnight stay.   Contacts, glasses, hearing aids, dentures or partials may not be worn into surgery, please bring cases for these belongings   For patients admitted to the hospital, discharge time will be determined by your treatment team.   Patients discharged the day of surgery will not be allowed to drive home, and someone needs to stay with them for 24 hours.   SURGICAL WAITING ROOM VISITATION Patients having surgery or a procedure may have no more than 2 support people in the waiting area - these visitors may rotate.   Children under the age of 61 must have an adult with them who is not the patient. If the patient needs to stay at the hospital during part of their recovery, the visitor guidelines for inpatient rooms apply. Pre-op nurse will coordinate an appropriate time for 1 support person to accompany patient in pre-op.  This support person may not rotate.   Please refer to the [facility] website for the visitor guidelines for Inpatients (after your surgery is over and you are in a regular room).    Special instructions:    Oral Hygiene is also important to reduce your risk of infection.  Remember - BRUSH YOUR TEETH THE MORNING OF SURGERY WITH YOUR REGULAR TOOTHPASTE   [place]- Preparing For Surgery  Before surgery, you can play an important role. Because skin is  not sterile, your skin needs to be as free of germs as possible. You can reduce the number of germs on your skin by washing with CHG (chlorahexidine gluconate) Soap before surgery.  CHG is an antiseptic cleaner which kills germs and bonds with the skin to continue killing germs even after washing.     Please do not use if you have an allergy to CHG or antibacterial soaps. If your skin becomes reddened/irritated stop using the CHG.  Do not shave (including legs and underarms) for at least 48 hours prior  to first CHG shower. It is OK to shave your face.  Please follow these instructions carefully.     Shower the NIGHT BEFORE SURGERY and the MORNING OF SURGERY with CHG Soap.   If you chose to wash your hair, wash your hair first as usual with your normal shampoo. After you shampoo, rinse your hair and body thoroughly to remove the shampoo.  Then [employer] and genitals (private parts) with your normal soap and rinse thoroughly to remove soap.  After that Use CHG Soap as you would any other liquid soap. You can apply CHG directly to the skin and wash gently with a scrungie or a clean washcloth.   Apply the CHG Soap to your body ONLY FROM THE NECK DOWN.  Do not use on open wounds or open sores. Avoid contact with your eyes, ears, mouth and genitals (private parts). Wash Face and genitals (private parts)  with your normal soap.   Wash thoroughly, paying special attention to the area where your surgery will be performed.  Thoroughly rinse your body with warm water from the neck down.  DO NOT shower/wash with your normal soap after using and rinsing off the CHG Soap.  Pat yourself dry with a CLEAN TOWEL.  Wear CLEAN PAJAMAS to bed the night before surgery  Place CLEAN SHEETS on your bed the night before your surgery  DO NOT SLEEP WITH PETS.   Day of Surgery:  Take a shower with CHG soap. Wear Clean/Comfortable clothing the morning of surgery Do not apply any deodorants/lotions.   Remember to brush your teeth WITH YOUR REGULAR TOOTHPASTE.    If you received a COVID test during your pre-op visit, it is requested that you wear a mask when out in public, stay away from anyone that may not be feeling well, and notify your surgeon if you develop symptoms. If you have been in contact with anyone that has tested positive in the last 10 days, please notify your surgeon.    Please read over the following fact sheets that you were given.

## 2022-07-18 ENCOUNTER — Other Ambulatory Visit: Payer: Self-pay

## 2022-07-18 ENCOUNTER — Encounter (HOSPITAL_COMMUNITY)
Admission: RE | Admit: 2022-07-18 | Discharge: 2022-07-18 | Disposition: A | Discharge status: Home / Self Care | Payer: PPO | Source: Ambulatory Visit | Attending: General Surgery | Admitting: General Surgery

## 2022-07-18 ENCOUNTER — Encounter (HOSPITAL_COMMUNITY): Payer: Self-pay

## 2022-07-18 DIAGNOSIS — J432 Centrilobular emphysema: Secondary | ICD-10-CM | POA: Diagnosis not present

## 2022-07-18 DIAGNOSIS — Z87891 Personal history of nicotine dependence: Secondary | ICD-10-CM | POA: Diagnosis not present

## 2022-07-18 DIAGNOSIS — I2510 Atherosclerotic heart disease of native coronary artery without angina pectoris: Secondary | ICD-10-CM | POA: Insufficient documentation

## 2022-07-18 DIAGNOSIS — Z85828 Personal history of other malignant neoplasm of skin: Secondary | ICD-10-CM | POA: Insufficient documentation

## 2022-07-18 DIAGNOSIS — E785 Hyperlipidemia, unspecified: Secondary | ICD-10-CM | POA: Diagnosis not present

## 2022-07-18 DIAGNOSIS — I10 Essential (primary) hypertension: Secondary | ICD-10-CM | POA: Insufficient documentation

## 2022-07-18 DIAGNOSIS — R7303 Prediabetes: Secondary | ICD-10-CM | POA: Diagnosis not present

## 2022-07-18 DIAGNOSIS — C50911 Malignant neoplasm of unspecified site of right female breast: Secondary | ICD-10-CM | POA: Insufficient documentation

## 2022-07-18 DIAGNOSIS — Z853 Personal history of malignant neoplasm of breast: Secondary | ICD-10-CM | POA: Insufficient documentation

## 2022-07-18 DIAGNOSIS — Z01818 Encounter for other preprocedural examination: Secondary | ICD-10-CM | POA: Insufficient documentation

## 2022-07-18 NOTE — Progress Notes (Signed)
[facility] Cardiologist - Dr. Glenetta Hew (Pt states she has upcoming appt in September)  PPM/ICD - denies  Chest x-ray - pt unsure if she has had one EKG - 02/28/22 Stress Test - denies ECHO - 07/04/22 Cardiac Cath - denies  Sleep Study - denies   DM- denies  Blood Thinner Instructions: n/a Aspirin Instructions: pt instructed to hold for 1 week. Last dose 8/16.  ERAS Protcol - yes PRE-SURGERY Ensure given at PAT  COVID TEST- n/a   Anesthesia review: yes, cardiac hx  Patient denies shortness of breath, fever, cough and chest pain at PAT appointment   All instructions explained to the patient, with a verbal understanding of the material. Patient agrees to go over the instructions while at home for a better understanding. The opportunity to ask questions was provided.

## 2022-07-18 NOTE — Pre-Procedure Instructions (Signed)
Surgical Instructions    Your procedure is scheduled on Thursday, August 24th.  Report to [facility] Main Entrance "A" at 8:00 A.M., then check in with the Admitting office.  Call this number if you have problems the morning of surgery:  [phone]   If you have any questions prior to your surgery date call [phone]: Open Monday-Friday 8am-4pm    Remember:  Do not eat after midnight the night before your surgery  You may drink clear liquids until 7:00 a.m. the morning of your surgery.   Clear liquids allowed are: Water, Non-Citrus Juices (without pulp), Carbonated Beverages, Clear Tea, Black Coffee ONLY (NO MILK, CREAM OR POWDERED CREAMER of any kind), and Gatorade   Patient Instructions  The night before surgery:  No food after midnight. ONLY clear liquids after midnight  The day of surgery (if you do NOT have diabetes):  Drink ONE (1) Pre-Surgery Clear Ensure by 7:00 AM the morning of surgery. Drink in one sitting. Do not sip.  This drink was given to you during your hospital  pre-op appointment visit.  Nothing else to drink after completing the  Pre-Surgery Clear Ensure.          If you have questions, please contact your surgeon's office.      Take these medicines the morning of surgery with A SIP OF WATER:  FLUoxetine (PROZAC)  Fluticasone-Umeclidin-Vilant (TRELEGY ELLIPTA)    Take these medications as needed: albuterol (PROVENTIL HFA)- Please bring all inhalers with you the day of surgery.  methocarbamol (ROBAXIN)   Follow your surgeon's instructions on when to stop Aspirin.  If no instructions were given by your surgeon then you will need to call the office to get those instructions.    As of today, STOP taking any Aleve, Naproxen, Ibuprofen, Motrin, Advil, Goody's, BC's, all herbal medications, fish oil, and all vitamins.           Do not wear jewelry or makeup. Do not wear lotions, powders, perfumes or deodorant. Do not shave 48 hours prior to surgery.    Do not bring valuables to the hospital. Do not wear nail polish, gel polish, artificial nails, or any other type of covering on natural nails (fingers and toes) If you have artificial nails or gel coating that need to be removed by a nail salon, please have this removed prior to surgery. Artificial nails or gel coating may interfere with anesthesia's ability to adequately monitor your vital signs.  [facility] is not responsible for any belongings or valuables.    Do NOT Smoke (Tobacco/Vaping)  24 hours prior to your procedure  If you use a CPAP at night, you may bring your mask for your overnight stay.   Contacts, glasses, hearing aids, dentures or partials may not be worn into surgery, please bring cases for these belongings   For patients admitted to the hospital, discharge time will be determined by your treatment team.   Patients discharged the day of surgery will not be allowed to drive home, and someone needs to stay with them for 24 hours.   SURGICAL WAITING ROOM VISITATION Patients having surgery or a procedure may have no more than 2 support people in the waiting area - these visitors may rotate.   Children under the age of 58 must have an adult with them who is not the patient. If the patient needs to stay at the hospital during part of their recovery, the visitor guidelines for inpatient rooms apply. Pre-op nurse will coordinate  an appropriate time for 1 support person to accompany patient in pre-op.  This support person may not rotate.   Please refer to the [facility] website for the visitor guidelines for Inpatients (after your surgery is over and you are in a regular room).    Special instructions:    Oral Hygiene is also important to reduce your risk of infection.  Remember - BRUSH YOUR TEETH THE MORNING OF SURGERY WITH YOUR REGULAR TOOTHPASTE   [place]- Preparing For Surgery  Before surgery, you can play an important role. Because skin is not sterile, your  skin needs to be as free of germs as possible. You can reduce the number of germs on your skin by washing with CHG (chlorahexidine gluconate) Soap before surgery.  CHG is an antiseptic cleaner which kills germs and bonds with the skin to continue killing germs even after washing.     Please do not use if you have an allergy to CHG or antibacterial soaps. If your skin becomes reddened/irritated stop using the CHG.  Do not shave (including legs and underarms) for at least 48 hours prior to first CHG shower. It is OK to shave your face.  Please follow these instructions carefully.     Shower the NIGHT BEFORE SURGERY and the MORNING OF SURGERY with CHG Soap.   If you chose to wash your hair, wash your hair first as usual with your normal shampoo. After you shampoo, rinse your hair and body thoroughly to remove the shampoo.  Then [employer] and genitals (private parts) with your normal soap and rinse thoroughly to remove soap.  After that Use CHG Soap as you would any other liquid soap. You can apply CHG directly to the skin and wash gently with a scrungie or a clean washcloth.   Apply the CHG Soap to your body ONLY FROM THE NECK DOWN.  Do not use on open wounds or open sores. Avoid contact with your eyes, ears, mouth and genitals (private parts). Wash Face and genitals (private parts)  with your normal soap.   Wash thoroughly, paying special attention to the area where your surgery will be performed.  Thoroughly rinse your body with warm water from the neck down.  DO NOT shower/wash with your normal soap after using and rinsing off the CHG Soap.  Pat yourself dry with a CLEAN TOWEL.  Wear CLEAN PAJAMAS to bed the night before surgery  Place CLEAN SHEETS on your bed the night before your surgery  DO NOT SLEEP WITH PETS.   Day of Surgery:  Take a shower with CHG soap. Wear Clean/Comfortable clothing the morning of surgery Do not apply any deodorants/lotions.   Remember to brush your  teeth WITH YOUR REGULAR TOOTHPASTE.    If you received a COVID test during your pre-op visit, it is requested that you wear a mask when out in public, stay away from anyone that may not be feeling well, and notify your surgeon if you develop symptoms. If you have been in contact with anyone that has tested positive in the last 10 days, please notify your surgeon.    Please read over the following fact sheets that you were given.

## 2022-07-19 NOTE — Progress Notes (Addendum)
Anesthesia Chart Review:  Case: [id number] Date/Time: 07/26/22 0946   Procedures:      RIGHT MASTECTOMY, RIGHT AXILLARY SENTINEL NODE BIOPSY (Right: Breast) - GEN & PEC BLOCK     RIGHT BREAST RECONSTRUCTION WITH PLACEMENT OF TISSUE EXPANDER AND ALLODERM (Right)   Anesthesia type: General   Pre-op diagnosis: RIGHT BREAST CANCER   Location: [facility] ROOM 09 / [place]   Surgeons: Rolm Bookbinder, MD; Irene Limbo, MD       DISCUSSION: Patient is a 66 year old female scheduled for the above procedure.  History includes former smoker (quit 04/01/22), emphysema, coronary/aortic calcifications (LAD & CX with Coronary Ca Score 79 03/2020), HTN, HLD, pre-diabetes, carotid artery disease (1-39% BICA 03/2021 [place]), breast cancer (right invasive lobular carcinoma 02/09/22; left IJ Port-a-cath 03/06/22 and began [facility] chemo 03/07/22), skin cancer, rhinoplasty (1981), AAA (5.4 cm 03/05/22 CT).   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 Burr Medico was on 06/20/22. She noted plans for breast surgery on 07/26/22. Last Ogivri and Perjecta on 07/11/22. Follow-up echo (for chemo) on 07/04/22 showed normal LVEF.   03/05/22 CT scan showed a 5.4 x 5.3 cm infrarenal AAA with large burden on intramural thrombus. Imaging in six months with vascular surgeon consultation recommended.   She got established with cardiologist Dr. Ellyn Hack on 03/15/20 due to HLD and coronary calcifications on CT imaging. Echo showed normal LVEF with no wall motion abnormalities. 03/2020 Coronary calcium score was 79 (80th percentile for demographic) with LAD and CX calcifications. Dr. Ellyn Hack felt calcium score was "relatively low risk". No ischemic testing recommended at that time. Last visit with Dr. Ellyn Hack was on 06/09/21. Recommended goal of LDL < 70 given known coronary calcifications (LDL 119 on 04/10/22 at [place] [facility]). Smoking cessation advised. Next follow-up is scheduled for 08/08/22.  She denied SOB, chest pain at PAT RN visit. 02/28/22 EKG stable.   Last ASA reported as 07/18/22.   Reviewed case with anesthesiologist Wallis Bamberg, MD. Message sent to Dr. Donne Hazel regarding AAA, however, the AAA is < 5.5 cm and imaging is within 6 months. (UPDATE 07/20/22 3:58 PM: Confirmed Dr. Donne Hazel has reviewed CT scan results and that Dr. Burr Medico will have AAA follow-up discussed with patient at her 08/01/22 NP visit.)   VS: BP (!) 146/87   Pulse 63   Temp 36.8 C (Oral)   Resp 18   Ht '5\' 9"'$  (1.753 m)   Wt 71.6 kg   SpO2 98%   BMI 23.32 kg/m    PROVIDERS: Cari Caraway, MD is PCP Truitt Merle, MD is Baker Pierini, MD is RAD-ONC Glenetta Hew, MD is cardiologist Marshell Garfinkel, MD is pulmonologist   LABS: Last lab results include: Lab Results  Component Value Date   WBC 7.6 07/11/2022   HGB 11.7 (L) 07/11/2022   HCT 34.4 (L) 07/11/2022   PLT 298 07/11/2022   GLUCOSE 118 (H) 07/11/2022   ALT 10 07/11/2022   AST 13 (L) 07/11/2022   NA 137 07/11/2022   K 4.2 07/11/2022   CL 103 07/11/2022   CREATININE 0.57 07/11/2022   BUN 14 07/11/2022   CO2 28 07/11/2022  A1c 6.1% on 04/10/22 ([facility])   PFTs 04/10/21: "FVC 3.56 [93%], FEV1 2.22 [76%], F/F 63, TLC 7.23 [124%], DLCO 18.57 [79%] Moderate obstruction with bronchodilator response"    IMAGES: Bone Scan 03/05/22: IMPRESSION: No evidence of bony metastatic disease.  CT Chest/abd/pelvis 03/05/22 (ordered by Dr. Burr Medico):  IMPRESSION: 1. Asymmetric, masslike density of the glandular tissue of the right breast with overlying skin thickening, in keeping with known primary breast malignancy. 2. Enlarged, ill-defined right axillary lymph node containing a biopsy marking clip, consistent with known nodal metastatic disease. 3. No other evidence of lymphadenopathy or metastatic disease in the chest, abdomen, or pelvis. 4. Emphysema. Background of fine centrilobular nodularity, most concentrated in the lung apices,  consistent with smoking-related respiratory bronchiolitis. 5. Aneurysm of the infrarenal abdominal aorta measuring up to 5.4 x 5.3 cm with a large burden of eccentric mural thrombus. Recommend follow-up CT/MR every 6 months and vascular consultation. This recommendation follows ACR consensus guidelines: White Paper of the ACR Incidental Findings Committee II on Vascular Findings. J Am Coll Radiol 2013; 10:789-794. 6. Coronary artery disease. ADDENDUM: Gallbladder was originally reported as surgically absent, by report, patient has not had cholecystectomy. The gallbladder is evidently folded and contracted, poorly visualized on this examination.    EKG: 02/28/22: Normal sinus rhythm Possible Anterior infarct , age undetermined Abnormal ECG When compared with ECG of 10-Sep-2020 10:41, PREVIOUS ECG IS PRESENT No significant change since last tracing Confirmed by Glenetta Hew [phone]) on 02/28/2022 9:07:00 PM   CV: Echo 07/04/22: IMPRESSIONS   1. Left ventricular ejection fraction, by estimation, is 55 to 60%. Left  ventricular ejection fraction by 3D volume is 60 %. The left ventricle has  normal function. The left ventricle has no regional wall motion  abnormalities. The left ventricular  internal cavity size was mildly dilated. Left ventricular diastolic  parameters were normal. The average left ventricular global longitudinal  strain is -14.7 %, which is reduced but appears to be poor tracking   2. Right ventricular systolic function is normal. The right ventricular  size is normal. There is normal pulmonary artery systolic pressure. The  estimated right ventricular systolic pressure is 25.4 mmHg.   3. Right atrial size was mildly dilated.   4. The mitral valve is normal in structure. No evidence of mitral valve  regurgitation. No evidence of mitral stenosis.   5. The aortic valve is tricuspid. Aortic valve regurgitation is not  visualized. Aortic valve sclerosis is present,  with no evidence of aortic  valve stenosis.   6. The inferior vena cava is normal in size with greater than 50%  respiratory variability, suggesting right atrial pressure of 3 mmHg.    US Carotid 03/20/21: Summary:  - Right Carotid: Velocities in the right ICA are consistent with a 1-39% stenosis. Non-hemodynamically significant plaque <50% noted in the CCA. Velocities in the RICA are essentially stable compared to prior exam.  - Left Carotid: Velocities in the left ICA are consistent with a 1-39% stenosis. Non-hemodynamically significant plaque <50% noted in the CCA. Velocities in the LICA are essentially stable compared to prior exam.  - Vertebrals:  Bilateral vertebral arteries demonstrate antegrade flow.  - Subclavians: Right subclavian artery flow was disturbed. Normal flow hemodynamics were seen in the left subclavian artery.    CT Coronary 03/24/20: - Ascending aorta: Some calcific atherosclerosis normal diameter 3.5 cm - Pericardium: Normal - Coronary arteries: Calcium noted in LAD and circumflex IMPRESSION: Coronary calcium score of 79. This was 23 th percentile for age and sex matched control. - Reviewed by Dr. Ellyn Hack, "Coronary calcium score 79.  Relatively low risk.  Mild atherosclerosis in the ascending aorta. Noncardiac findings shows left upper lobe nodule seen during April 7 screening exam."    Past  Medical History:  Diagnosis Date   Allergy    Lisinopril and lipitor   Anxiety and depression    Aortic atherosclerosis ([place]) 03/2020   CT Chest: 2 V (LAD & LCx) Coronary Atherosclerosis, Aortic Atherosclerosis (no aneurysm).  Mild centrilobular emphysema with mild diffuse bronchial thickening; several scattered small solitary pulmonary nodules (largest 5.6 mm in anterior left upper lobe)   Breast cancer ([place]) 12/2021   Right breast ILC   Carotid artery plaque, bilateral 10/2015   Mild to moderate plaque L>R without significant stenosis   COPD (chronic obstructive  pulmonary disease) (HCC)    Coronary Artery Calcification - Score 79    Coronary Calcium Score 79.  LAD and circumflex calcification noted.  Normal ascending aorta with mild calcification.   Current every day smoker    pt quit smoking April 2023   Emphysema lung [facility])    Noted on chest CT   Family history of breast cancer 02/21/2022   Family history of prostate cancer 02/21/2022   Hyperlipidemia    Hypertension    Controlled with amlodipine   Prediabetes    Skin cancer 2021   Remove 2022    Past Surgical History:  Procedure Laterality Date   CESAREAN SECTION  1990, [place] Left 03/06/2022   Procedure: INSERTION PORT-A-CATH;  Surgeon: Rolm Bookbinder, MD;  Location: [facility];  Service: General;  Laterality: Left;   [place] ECHOCARDIOGRAM  10/2016   EF 55 to 60%.  Normal systolic and diastolic function.  No ASD/PFO   TUBAL LIGATION  1991    MEDICATIONS:  albuterol (PROVENTIL HFA) 108 (90 Base) MCG/ACT inhaler   amLODipine (NORVASC) 10 MG tablet   aspirin 81 MG tablet   Calcium Carb-Cholecalciferol (CALCIUM 500 + D3 PO)   Cholecalciferol (VITAMIN D) 50 MCG (2000 UT) CAPS   doxycycline (VIBRA-TABS) 100 MG tablet   FLUoxetine (PROZAC) 20 MG capsule   Fluticasone-Umeclidin-Vilant (TRELEGY ELLIPTA) 200-62.5-25 MCG/ACT AEPB   lidocaine-prilocaine (EMLA) cream   methocarbamol (ROBAXIN) 500 MG tablet   OVER THE COUNTER MEDICATION   rosuvastatin (CRESTOR) 5 MG tablet   Specialty Vitamins Products (MAGNESIUM, AMINO ACID CHELATE,) 133 MG tablet   No current facility-administered medications for this encounter.    Myra Gianotti, PA-C Surgical Short Stay/Anesthesiology [facility] Phone [phone] [facility] Phone [phone] 07/19/2022 6:10 PM

## 2022-07-20 ENCOUNTER — Other Ambulatory Visit (HOSPITAL_COMMUNITY): Payer: Self-pay

## 2022-07-23 ENCOUNTER — Other Ambulatory Visit: Payer: Self-pay | Admitting: General Surgery

## 2022-07-23 DIAGNOSIS — Z170 Estrogen receptor positive status [ER+]: Secondary | ICD-10-CM

## 2022-07-25 ENCOUNTER — Other Ambulatory Visit: Payer: Self-pay | Admitting: General Surgery

## 2022-07-25 ENCOUNTER — Ambulatory Visit
Admission: RE | Admit: 2022-07-25 | Discharge: 2022-07-25 | Disposition: A | Discharge status: Home / Self Care | Payer: PPO | Source: Ambulatory Visit | Attending: General Surgery | Admitting: General Surgery

## 2022-07-25 DIAGNOSIS — C50811 Malignant neoplasm of overlapping sites of right female breast: Secondary | ICD-10-CM

## 2022-07-25 DIAGNOSIS — Z170 Estrogen receptor positive status [ER+]: Secondary | ICD-10-CM

## 2022-07-25 DIAGNOSIS — R590 Localized enlarged lymph nodes: Secondary | ICD-10-CM | POA: Diagnosis not present

## 2022-07-26 ENCOUNTER — Ambulatory Visit (HOSPITAL_COMMUNITY): Payer: PPO | Admitting: Physician Assistant

## 2022-07-26 ENCOUNTER — Other Ambulatory Visit: Payer: Self-pay

## 2022-07-26 ENCOUNTER — Encounter (HOSPITAL_COMMUNITY)
Admission: RE | Disposition: A | Discharge status: Home / Self Care | Payer: Self-pay | Source: Home / Self Care | Attending: General Surgery

## 2022-07-26 ENCOUNTER — Observation Stay (HOSPITAL_COMMUNITY)
Admission: RE | Admit: 2022-07-26 | Discharge: 2022-07-27 | Disposition: A | Discharge status: Home / Self Care | Payer: PPO | Attending: General Surgery | Admitting: General Surgery

## 2022-07-26 ENCOUNTER — Other Ambulatory Visit: Payer: Self-pay | Admitting: Cardiology

## 2022-07-26 ENCOUNTER — Encounter (HOSPITAL_COMMUNITY): Payer: Self-pay | Admitting: General Surgery

## 2022-07-26 ENCOUNTER — Ambulatory Visit (HOSPITAL_BASED_OUTPATIENT_CLINIC_OR_DEPARTMENT_OTHER): Payer: PPO | Admitting: Anesthesiology

## 2022-07-26 ENCOUNTER — Ambulatory Visit
Admission: RE | Admit: 2022-07-26 | Discharge: 2022-07-26 | Disposition: A | Discharge status: Home / Self Care | Payer: PPO | Source: Ambulatory Visit | Attending: General Surgery | Admitting: General Surgery

## 2022-07-26 ENCOUNTER — Other Ambulatory Visit (HOSPITAL_COMMUNITY): Payer: Self-pay

## 2022-07-26 DIAGNOSIS — F17210 Nicotine dependence, cigarettes, uncomplicated: Secondary | ICD-10-CM | POA: Insufficient documentation

## 2022-07-26 DIAGNOSIS — C50811 Malignant neoplasm of overlapping sites of right female breast: Secondary | ICD-10-CM

## 2022-07-26 DIAGNOSIS — Z7982 Long term (current) use of aspirin: Secondary | ICD-10-CM | POA: Diagnosis not present

## 2022-07-26 DIAGNOSIS — Z421 Encounter for breast reconstruction following mastectomy: Secondary | ICD-10-CM | POA: Diagnosis not present

## 2022-07-26 DIAGNOSIS — C50011 Malignant neoplasm of nipple and areola, right female breast: Secondary | ICD-10-CM | POA: Diagnosis not present

## 2022-07-26 DIAGNOSIS — Z170 Estrogen receptor positive status [ER+]: Secondary | ICD-10-CM | POA: Insufficient documentation

## 2022-07-26 DIAGNOSIS — C773 Secondary and unspecified malignant neoplasm of axilla and upper limb lymph nodes: Secondary | ICD-10-CM | POA: Diagnosis not present

## 2022-07-26 DIAGNOSIS — I2510 Atherosclerotic heart disease of native coronary artery without angina pectoris: Secondary | ICD-10-CM

## 2022-07-26 DIAGNOSIS — I10 Essential (primary) hypertension: Secondary | ICD-10-CM

## 2022-07-26 DIAGNOSIS — C50911 Malignant neoplasm of unspecified site of right female breast: Secondary | ICD-10-CM

## 2022-07-26 DIAGNOSIS — Z79899 Other long term (current) drug therapy: Secondary | ICD-10-CM | POA: Insufficient documentation

## 2022-07-26 DIAGNOSIS — R590 Localized enlarged lymph nodes: Secondary | ICD-10-CM | POA: Diagnosis not present

## 2022-07-26 DIAGNOSIS — J449 Chronic obstructive pulmonary disease, unspecified: Secondary | ICD-10-CM | POA: Insufficient documentation

## 2022-07-26 DIAGNOSIS — G8918 Other acute postprocedural pain: Secondary | ICD-10-CM | POA: Diagnosis not present

## 2022-07-26 DIAGNOSIS — Z87891 Personal history of nicotine dependence: Secondary | ICD-10-CM

## 2022-07-26 DIAGNOSIS — Z9011 Acquired absence of right breast and nipple: Secondary | ICD-10-CM

## 2022-07-26 HISTORY — PX: BREAST RECONSTRUCTION WITH PLACEMENT OF TISSUE EXPANDER AND ALLODERM: SHX6805

## 2022-07-26 HISTORY — PX: MASTECTOMY W/ SENTINEL NODE BIOPSY: SHX2001

## 2022-07-26 HISTORY — PX: RADIOACTIVE SEED GUIDED AXILLARY SENTINEL LYMPH NODE: SHX6735

## 2022-07-26 SURGERY — MASTECTOMY WITH SENTINEL LYMPH NODE BIOPSY
Anesthesia: General | Site: Breast | Laterality: Right

## 2022-07-26 MED ORDER — AMISULPRIDE (ANTIEMETIC) 5 MG/2ML IV SOLN: 10.0000 mg | Freq: Once | INTRAVENOUS | Status: DC | PRN

## 2022-07-26 MED ORDER — FENTANYL CITRATE (PF) 250 MCG/5ML IJ SOLN
INTRAMUSCULAR | Status: DC | PRN
  Administered 2022-07-26 (×2): 50 ug via INTRAVENOUS

## 2022-07-26 MED ORDER — METHYLENE BLUE 1 % INJ SOLN
INTRAVENOUS | Status: AC
  Filled 2022-07-26: qty 10

## 2022-07-26 MED ORDER — FENTANYL CITRATE (PF) 100 MCG/2ML IJ SOLN
INTRAMUSCULAR | Status: AC
  Administered 2022-07-26: 50 ug via INTRAVENOUS
  Filled 2022-07-26: qty 2

## 2022-07-26 MED ORDER — PHENYLEPHRINE HCL-NACL 20-0.9 MG/250ML-% IV SOLN
INTRAVENOUS | Status: DC | PRN
  Administered 2022-07-26: 30 ug/min via INTRAVENOUS

## 2022-07-26 MED ORDER — CHLORHEXIDINE GLUCONATE CLOTH 2 % EX PADS: 6.0000 | MEDICATED_PAD | Freq: Once | CUTANEOUS | Status: DC

## 2022-07-26 MED ORDER — FENTANYL CITRATE (PF) 100 MCG/2ML IJ SOLN: 50.0000 ug | Freq: Once | INTRAMUSCULAR | Status: AC

## 2022-07-26 MED ORDER — PROPOFOL 10 MG/ML IV BOLUS
INTRAVENOUS | Status: AC
  Filled 2022-07-26: qty 20

## 2022-07-26 MED ORDER — ONDANSETRON HCL 4 MG/2ML IJ SOLN: 4.0000 mg | Freq: Four times a day (QID) | INTRAMUSCULAR | Status: DC | PRN

## 2022-07-26 MED ORDER — ROSUVASTATIN CALCIUM 5 MG PO TABS
5.0000 mg | ORAL_TABLET | Freq: Every day | ORAL | Status: DC
  Administered 2022-07-26 – 2022-07-27 (×2): 5 mg via ORAL
  Filled 2022-07-26 (×2): qty 1

## 2022-07-26 MED ORDER — FLUOXETINE HCL 20 MG PO CAPS
20.0000 mg | ORAL_CAPSULE | Freq: Every day | ORAL | Status: DC
  Administered 2022-07-26 – 2022-07-27 (×2): 20 mg via ORAL
  Filled 2022-07-26 (×2): qty 1

## 2022-07-26 MED ORDER — SODIUM CHLORIDE 0.9 % IV SOLN
INTRAVENOUS | Status: DC
Start: 2022-07-26 — End: 2022-07-27

## 2022-07-26 MED ORDER — PHENYLEPHRINE HCL (PRESSORS) 10 MG/ML IV SOLN
INTRAVENOUS | Status: DC | PRN
  Administered 2022-07-26 (×2): 80 ug via INTRAVENOUS

## 2022-07-26 MED ORDER — SODIUM CHLORIDE 0.9 % IV SOLN: INTRAVENOUS | Status: DC | PRN

## 2022-07-26 MED ORDER — SUGAMMADEX SODIUM 200 MG/2ML IV SOLN
INTRAVENOUS | Status: DC | PRN
  Administered 2022-07-26: 145.2 mg via INTRAVENOUS

## 2022-07-26 MED ORDER — CEFAZOLIN SODIUM-DEXTROSE 1-4 GM/50ML-% IV SOLN
1.0000 g | Freq: Three times a day (TID) | INTRAVENOUS | Status: AC
  Administered 2022-07-26 – 2022-07-27 (×3): 1 g via INTRAVENOUS
  Filled 2022-07-26 (×3): qty 50

## 2022-07-26 MED ORDER — ROCURONIUM BROMIDE 10 MG/ML (PF) SYRINGE
PREFILLED_SYRINGE | INTRAVENOUS | Status: AC
  Filled 2022-07-26: qty 10

## 2022-07-26 MED ORDER — METHOCARBAMOL 500 MG PO TABS: 1000.0000 mg | ORAL_TABLET | Freq: Three times a day (TID) | ORAL | Status: DC | PRN

## 2022-07-26 MED ORDER — MIDAZOLAM HCL 2 MG/2ML IJ SOLN
INTRAMUSCULAR | Status: AC
  Administered 2022-07-26: 1 mg via INTRAVENOUS
  Filled 2022-07-26: qty 2

## 2022-07-26 MED ORDER — MORPHINE SULFATE (PF) 2 MG/ML IV SOLN: 2.0000 mg | INTRAVENOUS | Status: DC | PRN

## 2022-07-26 MED ORDER — ALBUTEROL SULFATE HFA 108 (90 BASE) MCG/ACT IN AERS: 1.0000 | INHALATION_SPRAY | Freq: Four times a day (QID) | RESPIRATORY_TRACT | Status: DC | PRN

## 2022-07-26 MED ORDER — SODIUM CHLORIDE (PF) 0.9 % IJ SOLN
INTRAMUSCULAR | Status: AC
  Filled 2022-07-26: qty 10

## 2022-07-26 MED ORDER — HEMOSTATIC AGENTS (NO CHARGE) OPTIME
TOPICAL | Status: DC | PRN
  Administered 2022-07-26 (×2): 1 via TOPICAL

## 2022-07-26 MED ORDER — SODIUM CHLORIDE (PF) 0.9 % IJ SOLN
INTRAVENOUS | Status: DC | PRN
  Administered 2022-07-26: 5 mL via INTRAMUSCULAR

## 2022-07-26 MED ORDER — FENTANYL CITRATE (PF) 250 MCG/5ML IJ SOLN
INTRAMUSCULAR | Status: AC
  Filled 2022-07-26: qty 5

## 2022-07-26 MED ORDER — DEXAMETHASONE SODIUM PHOSPHATE 10 MG/ML IJ SOLN
INTRAMUSCULAR | Status: AC
  Filled 2022-07-26: qty 1

## 2022-07-26 MED ORDER — LIDOCAINE 2% (20 MG/ML) 5 ML SYRINGE
INTRAMUSCULAR | Status: AC
  Filled 2022-07-26: qty 5

## 2022-07-26 MED ORDER — 0.9 % SODIUM CHLORIDE (POUR BTL) OPTIME
TOPICAL | Status: DC | PRN
  Administered 2022-07-26 (×2): 1000 mL

## 2022-07-26 MED ORDER — LACTATED RINGERS IV SOLN: INTRAVENOUS | Status: DC | PRN

## 2022-07-26 MED ORDER — SIMETHICONE 80 MG PO CHEW: 40.0000 mg | CHEWABLE_TABLET | Freq: Four times a day (QID) | ORAL | Status: DC | PRN

## 2022-07-26 MED ORDER — CEFAZOLIN SODIUM-DEXTROSE 2-4 GM/100ML-% IV SOLN: 2.0000 g | INTRAVENOUS | Status: DC

## 2022-07-26 MED ORDER — BUPIVACAINE-EPINEPHRINE (PF) 0.25% -1:200000 IJ SOLN
INTRAMUSCULAR | Status: DC | PRN
  Administered 2022-07-26: 30 mL

## 2022-07-26 MED ORDER — FLUTICASONE FUROATE-VILANTEROL 200-25 MCG/ACT IN AEPB
1.0000 | INHALATION_SPRAY | Freq: Every day | RESPIRATORY_TRACT | Status: DC
  Administered 2022-07-27: 1 via RESPIRATORY_TRACT
  Filled 2022-07-26: qty 28

## 2022-07-26 MED ORDER — FENTANYL CITRATE (PF) 100 MCG/2ML IJ SOLN: 25.0000 ug | INTRAMUSCULAR | Status: DC | PRN

## 2022-07-26 MED ORDER — DEXAMETHASONE SODIUM PHOSPHATE 10 MG/ML IJ SOLN
INTRAMUSCULAR | Status: DC | PRN
  Administered 2022-07-26: 10 mg via INTRAVENOUS

## 2022-07-26 MED ORDER — CEFAZOLIN SODIUM-DEXTROSE 2-4 GM/100ML-% IV SOLN
2.0000 g | INTRAVENOUS | Status: AC
  Administered 2022-07-26: 2 g via INTRAVENOUS
  Filled 2022-07-26: qty 100

## 2022-07-26 MED ORDER — ONDANSETRON HCL 4 MG/2ML IJ SOLN
INTRAMUSCULAR | Status: AC
  Filled 2022-07-26: qty 2

## 2022-07-26 MED ORDER — ACETAMINOPHEN 500 MG PO TABS
1000.0000 mg | ORAL_TABLET | Freq: Four times a day (QID) | ORAL | Status: DC
  Administered 2022-07-26 – 2022-07-27 (×3): 1000 mg via ORAL
  Filled 2022-07-26 (×4): qty 2

## 2022-07-26 MED ORDER — ONDANSETRON HCL 4 MG/2ML IJ SOLN
INTRAMUSCULAR | Status: DC | PRN
  Administered 2022-07-26: 4 mg via INTRAVENOUS

## 2022-07-26 MED ORDER — UMECLIDINIUM BROMIDE 62.5 MCG/ACT IN AEPB
1.0000 | INHALATION_SPRAY | Freq: Every day | RESPIRATORY_TRACT | Status: DC
  Administered 2022-07-27: 1 via RESPIRATORY_TRACT
  Filled 2022-07-26: qty 7

## 2022-07-26 MED ORDER — ENSURE PRE-SURGERY PO LIQD: 296.0000 mL | Freq: Once | ORAL | Status: DC

## 2022-07-26 MED ORDER — AMLODIPINE BESYLATE 10 MG PO TABS
10.0000 mg | ORAL_TABLET | Freq: Every day | ORAL | Status: DC
  Administered 2022-07-26: 10 mg via ORAL
  Filled 2022-07-26: qty 1

## 2022-07-26 MED ORDER — OXYCODONE HCL 5 MG PO TABS
5.0000 mg | ORAL_TABLET | ORAL | Status: DC | PRN
  Administered 2022-07-26: 10 mg via ORAL
  Filled 2022-07-26: qty 2

## 2022-07-26 MED ORDER — ACETAMINOPHEN 500 MG PO TABS
1000.0000 mg | ORAL_TABLET | ORAL | Status: AC
  Administered 2022-07-26: 1000 mg via ORAL
  Filled 2022-07-26: qty 2

## 2022-07-26 MED ORDER — CHLORHEXIDINE GLUCONATE 0.12 % MT SOLN
OROMUCOSAL | Status: AC
  Administered 2022-07-26: 15 mL
  Filled 2022-07-26: qty 15

## 2022-07-26 MED ORDER — PHENYLEPHRINE 80 MCG/ML (10ML) SYRINGE FOR IV PUSH (FOR BLOOD PRESSURE SUPPORT)
PREFILLED_SYRINGE | INTRAVENOUS | Status: AC
  Filled 2022-07-26: qty 10

## 2022-07-26 MED ORDER — LIDOCAINE 2% (20 MG/ML) 5 ML SYRINGE
INTRAMUSCULAR | Status: DC | PRN
  Administered 2022-07-26: 20 mg via INTRAVENOUS

## 2022-07-26 MED ORDER — ONDANSETRON 4 MG PO TBDP: 4.0000 mg | ORAL_TABLET | Freq: Four times a day (QID) | ORAL | Status: DC | PRN

## 2022-07-26 MED ORDER — PROPOFOL 10 MG/ML IV BOLUS
INTRAVENOUS | Status: DC | PRN
  Administered 2022-07-26: 20 mg via INTRAVENOUS
  Administered 2022-07-26: 150 mg via INTRAVENOUS

## 2022-07-26 MED ORDER — ALBUTEROL SULFATE (2.5 MG/3ML) 0.083% IN NEBU
2.5000 mg | INHALATION_SOLUTION | Freq: Four times a day (QID) | RESPIRATORY_TRACT | Status: DC | PRN
Start: 2022-07-26 — End: 2022-07-27

## 2022-07-26 MED ORDER — MIDAZOLAM HCL 2 MG/2ML IJ SOLN: 1.0000 mg | Freq: Once | INTRAMUSCULAR | Status: AC

## 2022-07-26 MED ORDER — CLONIDINE HCL (ANALGESIA) 100 MCG/ML EP SOLN
EPIDURAL | Status: DC | PRN
  Administered 2022-07-26: 50 ug

## 2022-07-26 MED ORDER — ROCURONIUM BROMIDE 10 MG/ML (PF) SYRINGE
PREFILLED_SYRINGE | INTRAVENOUS | Status: DC | PRN
  Administered 2022-07-26: 30 mg via INTRAVENOUS
  Administered 2022-07-26: 10 mg via INTRAVENOUS
  Administered 2022-07-26: 50 mg via INTRAVENOUS

## 2022-07-26 SURGICAL SUPPLY — 97 items
ADH SKN CLS APL DERMABOND .7 (GAUZE/BANDAGES/DRESSINGS) ×4
ALLOGRAFT PERF 16X20 1.6+/-0.4 (Tissue) IMPLANT
APL PRP STRL LF DISP 70% ISPRP (MISCELLANEOUS) ×4
APPLIER CLIP 9.375 MED OPEN (MISCELLANEOUS) ×4
APR CLP MED 9.3 20 MLT OPN (MISCELLANEOUS) ×4
BAG COUNTER SPONGE SURGICOUNT (BAG) ×4 IMPLANT
BAG DECANTER FOR FLEXI CONT (MISCELLANEOUS) ×2 IMPLANT
BAG SPNG CNTER NS LX DISP (BAG) ×4
BINDER BREAST LRG (GAUZE/BANDAGES/DRESSINGS) IMPLANT
BINDER BREAST XLRG (GAUZE/BANDAGES/DRESSINGS) IMPLANT
BLADE SURG 10 STRL SS (BLADE) IMPLANT
BNDG GAUZE ELAST 4 BULKY (GAUZE/BANDAGES/DRESSINGS) ×4 IMPLANT
CANISTER SUCT 3000ML PPV (MISCELLANEOUS) ×4 IMPLANT
CHLORAPREP W/TINT 26 (MISCELLANEOUS) ×4 IMPLANT
CLIP APPLIE 9.375 MED OPEN (MISCELLANEOUS) IMPLANT
CNTNR URN SCR LID CUP LEK RST (MISCELLANEOUS) ×2 IMPLANT
CONT SPEC 4OZ STRL OR WHT (MISCELLANEOUS) ×2
COVER MAYO STAND STRL (DRAPES) IMPLANT
COVER PROBE W GEL 5X96 (DRAPES) ×2 IMPLANT
COVER SURGICAL LIGHT HANDLE (MISCELLANEOUS) ×4 IMPLANT
DERMABOND ADVANCED (GAUZE/BANDAGES/DRESSINGS) ×4
DERMABOND ADVANCED .7 DNX12 (GAUZE/BANDAGES/DRESSINGS) ×6 IMPLANT
DRAIN CHANNEL 15F RND FF W/TCR (WOUND CARE) IMPLANT
DRAIN CHANNEL 19F RND (DRAIN) ×6 IMPLANT
DRAPE HALF SHEET 40X57 (DRAPES) IMPLANT
DRAPE INCISE IOBAN 66X45 STRL (DRAPES) ×2 IMPLANT
DRAPE TOP ARMCOVERS (MISCELLANEOUS) ×4 IMPLANT
DRAPE U-SHAPE 76X120 STRL (DRAPES) ×4 IMPLANT
DRAPE UTILITY XL STRL (DRAPES) ×2 IMPLANT
DRAPE WARM FLUID 44X44 (DRAPES) ×2 IMPLANT
DRSG PAD ABDOMINAL 8X10 ST (GAUZE/BANDAGES/DRESSINGS) ×4 IMPLANT
DRSG TEGADERM 4X10 (GAUZE/BANDAGES/DRESSINGS) ×4 IMPLANT
DRSG TEGADERM 4X4.75 (GAUZE/BANDAGES/DRESSINGS) IMPLANT
ELECT BLADE 4.0 EZ CLEAN MEGAD (MISCELLANEOUS) ×4
ELECT CAUTERY BLADE 6.4 (BLADE) ×4 IMPLANT
ELECT COATED BLADE 2.86 ST (ELECTRODE) ×2 IMPLANT
ELECT REM PT RETURN 9FT ADLT (ELECTROSURGICAL) ×4
ELECTRODE BLDE 4.0 EZ CLN MEGD (MISCELLANEOUS) ×4 IMPLANT
ELECTRODE REM PT RTRN 9FT ADLT (ELECTROSURGICAL) ×4 IMPLANT
EVACUATOR SILICONE 100CC (DRAIN) ×2 IMPLANT
EXPANDER TISSUE FV FOURTE 500 (Prosthesis & Implant Plastic) IMPLANT
GAUZE SPONGE 4X4 12PLY STRL (GAUZE/BANDAGES/DRESSINGS) ×2 IMPLANT
GLOVE BIO SURGEON STRL SZ 6 (GLOVE) ×6 IMPLANT
GLOVE BIO SURGEON STRL SZ7 (GLOVE) ×2 IMPLANT
GLOVE BIOGEL PI IND STRL 7.5 (GLOVE) ×2 IMPLANT
GLOVE BIOGEL PI INDICATOR 7.5 (GLOVE) ×2
GOWN STRL REUS W/ TWL LRG LVL3 (GOWN DISPOSABLE) ×10 IMPLANT
GOWN STRL REUS W/TWL LRG LVL3 (GOWN DISPOSABLE) ×10
HEMOSTAT ARISTA ABSORB 3G PWDR (HEMOSTASIS) IMPLANT
KIT BASIN OR (CUSTOM PROCEDURE TRAY) ×4 IMPLANT
KIT TURNOVER KIT B (KITS) ×4 IMPLANT
LIGHT WAVEGUIDE WIDE FLAT (MISCELLANEOUS) IMPLANT
MARKER SKIN DUAL TIP RULER LAB (MISCELLANEOUS) ×4 IMPLANT
NDL 18GX1X1/2 (RX/OR ONLY) (NEEDLE) IMPLANT
NDL FILTER BLUNT 18X1 1/2 (NEEDLE) IMPLANT
NDL HYPO 25GX1X1/2 BEV (NEEDLE) IMPLANT
NEEDLE 18GX1X1/2 (RX/OR ONLY) (NEEDLE) IMPLANT
NEEDLE FILTER BLUNT 18X 1/2SAF (NEEDLE)
NEEDLE FILTER BLUNT 18X1 1/2 (NEEDLE) IMPLANT
NEEDLE HYPO 25GX1X1/2 BEV (NEEDLE) IMPLANT
NS IRRIG 1000ML POUR BTL (IV SOLUTION) ×6 IMPLANT
PACK GENERAL/GYN (CUSTOM PROCEDURE TRAY) ×4 IMPLANT
PAD ARMBOARD 7.5X6 YLW CONV (MISCELLANEOUS) ×4 IMPLANT
PENCIL SMOKE EVACUATOR (MISCELLANEOUS) ×2 IMPLANT
PIN SAFETY STERILE (MISCELLANEOUS) ×4 IMPLANT
SET ASEPTIC TRANSFER (MISCELLANEOUS) ×2 IMPLANT
SOL PREP POV-IOD 4OZ 10% (MISCELLANEOUS) ×2 IMPLANT
SPECIMEN JAR X LARGE (MISCELLANEOUS) ×2 IMPLANT
SPONGE T-LAP 18X18 ~~LOC~~+RFID (SPONGE) IMPLANT
STAPLER VISISTAT 35W (STAPLE) ×4 IMPLANT
STRIP CLOSURE SKIN 1/2X4 (GAUZE/BANDAGES/DRESSINGS) ×2 IMPLANT
SUT CHROMIC 4 0 PS 2 18 (SUTURE) IMPLANT
SUT DVC VLOC 180 0 12IN GS21 (SUTURE) ×2
SUT ETHILON 2 0 FS 18 (SUTURE) ×4 IMPLANT
SUT MNCRL AB 4-0 PS2 18 (SUTURE) ×4 IMPLANT
SUT SILK 2 0 SH (SUTURE) IMPLANT
SUT VIC AB 0 CT2 27 (SUTURE) IMPLANT
SUT VIC AB 2-0 SH 27 (SUTURE) ×2
SUT VIC AB 2-0 SH 27X BRD (SUTURE) IMPLANT
SUT VIC AB 3-0 54X BRD REEL (SUTURE) ×2 IMPLANT
SUT VIC AB 3-0 BRD 54 (SUTURE)
SUT VIC AB 3-0 SH 18 (SUTURE) ×2 IMPLANT
SUT VIC AB 3-0 SH 27 (SUTURE) ×6
SUT VIC AB 3-0 SH 27X BRD (SUTURE) IMPLANT
SUT VIC AB 3-0 SH 8-18 (SUTURE) ×2 IMPLANT
SUT VICRYL 4-0 PS2 18IN ABS (SUTURE) IMPLANT
SUT VLOC 180 0 24IN GS25 (SUTURE) IMPLANT
SUTURE DVC VLC 180 0 12IN GS21 (SUTURE) IMPLANT
SYR 50ML LL SCALE MARK (SYRINGE) IMPLANT
SYR BULB IRRIG 60ML STRL (SYRINGE) ×2 IMPLANT
SYR CONTROL 10ML LL (SYRINGE) IMPLANT
TISSUE EXPNDR FV FOURTE 500 (Prosthesis & Implant Plastic) ×2 IMPLANT
TOWEL GREEN STERILE (TOWEL DISPOSABLE) ×4 IMPLANT
TOWEL GREEN STERILE FF (TOWEL DISPOSABLE) ×4 IMPLANT
TRAY FOLEY W/BAG SLVR 16FR (SET/KITS/TRAYS/PACK)
TRAY FOLEY W/BAG SLVR 16FR ST (SET/KITS/TRAYS/PACK) IMPLANT
UNDERPAD 30X36 HEAVY ABSORB (UNDERPADS AND DIAPERS) ×4 IMPLANT

## 2022-07-26 NOTE — Transfer of Care (Signed)
Immediate Anesthesia Transfer of Care Note  Patient: Ashley Robertson  Procedure(s) Performed: RIGHT MASTECTOMY, RIGHT AXILLARY SENTINEL NODE BIOPSY (Right: Breast) RADIOACTIVE SEED GUIDED AXILLARY SENTINEL LYMPH NODE DISSECTION (Right) RIGHT BREAST RECONSTRUCTION WITH PLACEMENT OF TISSUE EXPANDER AND ALLODERM (Right)  Patient Location: PACU  Anesthesia Type:GA combined with regional for post-op pain  Level of Consciousness: awake, alert  and oriented  Airway & Oxygen Therapy: Patient Spontanous Breathing  Post-op Assessment: Report given to RN, Post -op Vital signs reviewed and stable, Patient moving all extremities X 4 and Patient able to stick tongue midline  Post vital signs: Reviewed  Last Vitals:  Vitals Value Taken Time  BP 152/83 07/26/22 1221  Temp 98.2   Pulse 73 07/26/22 1223  Resp 16 07/26/22 1223  SpO2 92 % 07/26/22 1223  Vitals shown include unvalidated device data.  Last Pain:  Vitals:   07/26/22 0807  TempSrc:   PainSc: 0-No pain         Complications: No notable events documented.

## 2022-07-26 NOTE — Anesthesia Procedure Notes (Addendum)
Procedure Name: Intubation Date/Time: 07/26/2022 9:32 AM  Performed by: Maude Leriche, CRNAPre-anesthesia Checklist: Patient identified, Emergency Drugs available, Suction available and Patient being monitored Patient Re-evaluated:Patient Re-evaluated prior to induction Oxygen Delivery Method: Circle system utilized Preoxygenation: Pre-oxygenation with 100% oxygen Induction Type: IV induction Ventilation: Mask ventilation without difficulty Laryngoscope Size: Mac and 4 Grade View: Grade II Tube type: Oral Tube size: 7.0 mm Number of attempts: 2 Airway Equipment and Method: Stylet and Bite block Placement Confirmation: ETT inserted through vocal cords under direct vision, positive ETCO2 and breath sounds checked- equal and bilateral Secured at: 21 cm Tube secured with: Tape Dental Injury: Teeth and Oropharynx as per pre-operative assessment  Comments: 1st attempt by EMT-P MAC 4 blade, Grade III view, 2nd (final) attempt by CRNA , blade remained in oropharynx, MAC 4 blade Grade II view w/BURP maneuver. Suctioned scant blood from ETT after intubation.

## 2022-07-26 NOTE — Interval H&P Note (Signed)
History and Physical Interval Note:  07/26/2022 8:57 AM  Ashley Robertson  has presented today for surgery, with the diagnosis of RIGHT BREAST CANCER.  The various methods of treatment have been discussed with the patient and family. After consideration of risks, benefits and other options for treatment, the patient has consented to  Procedure(s) with comments: RIGHT MASTECTOMY, RIGHT AXILLARY SENTINEL NODE BIOPSY (Right) - GEN & PEC BLOCK RADIOACTIVE SEED GUIDED AXILLARY SENTINEL LYMPH NODE DISSECTION (Right) RIGHT BREAST RECONSTRUCTION WITH PLACEMENT OF TISSUE EXPANDER AND ALLODERM (Right) as a surgical intervention.  The patient's history has been reviewed, patient examined, no change in status, stable for surgery.  I have reviewed the patient's chart and labs.  Questions were answered to the patient's satisfaction.     Rolm Bookbinder

## 2022-07-26 NOTE — Op Note (Signed)
Preoperative diagnosis right breast cancer status post primary chemotherapy Postoperative diagnosis: Same as above Procedure: 1.  Right skin sparing mastectomy 2.  Injection of mag trace and methylene blue for sentinel lymph node identification 3.  Right axillary node seed guided excision converted to axillary node dissection Surgeon: Dr. Serita Grammes Anesthesia: General with a pectoral block Estimated blood loss: 150 cc Drains: 19 French Blake drain to axilla and the remainder of the drains will be placed by plastic surgery Complications: None Specimens: 1.  Right breast marked short superior, long lateral 2.  Right axillary seed containing node that is also a sentinel node 3.  Additional right axillary nodes Sponge and count was correct at completion.   Disposition Case turned over to plastic surgery for reconstruction  Indications:65 y.o. female who had a mammogram that showed a new numerous ill-defined masses in the breast as well as some skin thickening. She underwent an ultrasound as well. There are 2 areas that measure 3.3 cm at 11:00 2.6 cm at 6:00. Both biopsy. There are other areas impacting this. These clips are almost 6 cm apart. Both of these biopsies were positive. She also has 1 abnormal axillary node that was positive. The breast is a grade 2 invasive lobular carcinoma that was 100% ER positive, PR negative, FISH is positive for HER2 and the Ki-67 is 10%. The node is also positive and HER2 positive. She has completed neoadjuvant chemo. She has done really well. No real change in her exam per her. Has seen plastic surgery.  We have elected proceed with a right skin sparing mastectomy with targeted axillary node dissection as she is clinically negative.  Procedure: After informed consent was obtained I first injected 2 cc of mag trace in the subareolar position after doing a timeout.  She then underwent a pectoral block.  Antibiotics were given.  SCDs were in place.  She was  placed under general anesthesia without complication.  She was prepped and draped in the standard sterile surgical fashion.  Surgical timeout was then performed.  We made a triangular incision to encompass the nipple and areola in order to save as much skin as possible.  I then created flaps to the parasternal area, clavicle, latissimus, and inframammary fold.  There was a fair amount of the bleeding occurring during this dissection as there were numerous large vessels entering into the tumor.  These were controlled with clips as well as cautery.  I then remove the breast on the pectoralis fascia from the muscle.  This was marked and passed off the table.  Attached to this was the seed containing node and several other enlarged nodes.  I did remove these separately and passed them off.  Mammogram confirmed removal of the seed as well as the clip and the previously biopsied node.  There were several other enlarged nodes.  I elected at this point to make an axillary incision.  I then located an additional large sentinel node but there were several other nodes so I ended up really doing a low axillary dissection.  I identified the axillary vein and the thoracodorsal bundle.  I then remove the tissue caudad to the vein.  This was done down to the muscle.  There was no other abnormal tissue in the axilla upon completion.  Hemostasis was obtained.  I placed a 41 [place] Blake drain in the axilla.  I secured this with a 2-0 nylon suture.  I placed some Arista and then closed this with 3-0 Vicryl  for Monocryl.  The case was then turned over to plastic surgery for reconstruction.

## 2022-07-26 NOTE — H&P (Signed)
66 y.o. female who is seen today as an office consultation for evaluation of Breast Cancer She has a family history in a paternal aunt with breast cancer in her father with sarcoma prostate cancer. She does have a history of COPD. She had a palpable right breast mass. She has see density breast. Her mammogram showed a new numerous ill-defined masses in the breast as well as some skin thickening. She has no redness or discharge noted. She underwent an ultrasound on the mammogram. There are 2 areas that measure 3.3 cm at 11:00 2.6 cm at 6:00. Both biopsy. There are other areas impacting this. These clips are almost 6 cm apart. Both of these biopsies were positive. She also has 1 abnormal axillary node that was positive. The breast is a grade 2 invasive lobular carcinoma that was 100% ER positive, PR negative, FISH is positive for HER2 and the Ki-67 is 10%. The node is also positive and HER2 positive. She has completed neoadjuvant chemo. She has done really well. No real change in her exam per her. Has seen plastic surgery.    Review of Systems: A complete review of systems was obtained from the patient. I have reviewed this information and discussed as appropriate with the patient. See HPI as well for other ROS.  Review of Systems  All other systems reviewed and are negative.   Medical History: Past Medical History:  Diagnosis Date  Anxiety  Arthritis  Hyperlipidemia  Hypertension   Patient Active Problem List  Diagnosis  Malignant neoplasm of overlapping sites of right breast in female, estrogen receptor positive (CMS-HCC)  Hypercholesterolemia  Essential (primary) hypertension  Current every day smoker   Past Surgical History:  Procedure Laterality Date  CESAREAN SECTION  1990, [place]    Allergies  Allergen Reactions  Lisinopril Cough and Other (See Comments)  High potassium  Atorvastatin Muscle Pain and Other (See Comments)   Myalgias   Current Outpatient Medications on File Prior to Visit  Medication Sig Dispense Refill  albuterol 90 mcg/actuation inhaler 1-2 puffs as needed  amLODIPine (NORVASC) 10 MG tablet 1 tablet  aspirin 81 MG EC tablet 1 tablet  calcium carbonate 500 mg calcium (1,250 mg) tablet 1 tablet with meals  chlorthalidone 25 MG tablet  FLUoxetine (PROZAC) 20 MG capsule 1 capsule  rosuvastatin (CRESTOR) 5 MG tablet 2 tablets M/W/F and 1 tablet other days   Family History  Problem Relation Age of Onset  Hyperlipidemia (Elevated cholesterol) Father    Social History   Tobacco Use  Smoking Status Every Day  Types: Cigarettes  Smokeless Tobacco Not on file  Marital status: Married  Tobacco Use  Smoking status: Every Day  Types: Cigarettes  Substance and Sexual Activity  Alcohol use: Not Currently  Drug use: Not Currently   Objective:   Physical Exam   Physical Exam Vitals reviewed.  Constitutional:  Appearance: Normal appearance.  Chest:  Breasts: Right: Inverted nipple and mass still present. No nipple discharge or skin change.  Left: No inverted nipple, mass, nipple discharge or skin change.  Comments: Right breast with inverted nipple andmultiple masses Lymphadenopathy:  Upper Body:  Right upper body: no axillary adenopathy No supraclavicular adenopathy.  Left upper body: No supraclavicular or axillary adenopathy.  Neurological:  Mental Status: She is alert.    Assessment and Plan:   Right mastectomy, right TAD  I think she will still require a mastectomy to clear this disease. This is going to  be a skin sparing mastectomy. She does desire reconstruction will have a tissue expander placed at the same time with plastic surgery. We also discussed a targeted axillary lymph node dissection with a seed guided node excision as well as a sentinel lymph node biopsy. We discussed the risk of a continued positive node and we would discuss this after the permanent pathology  was back. We discussed the tracer as well as some discoloration associated with this. I am going to cancel her MRI that she has next week as I do not think is going to change anything currently She also asked about a double mastectomy for symmetry. We discussed also a reduction in a lift on the other side might be a better option as there is no real cancer reason she needs the double mastectomy. I will relay that to plastic surgery.

## 2022-07-26 NOTE — Anesthesia Preprocedure Evaluation (Signed)
Anesthesia Evaluation  Patient identified by MRN, date of birth, ID band Patient awake    Reviewed: Allergy & Precautions, NPO status , Patient's Chart, lab work & pertinent test results  Airway Mallampati: II  TM Distance: >3 FB Neck ROM: Full    Dental  (+) Dental Advisory Given   Pulmonary COPD, former smoker,    breath sounds clear to auscultation       Cardiovascular hypertension, Pt. on medications + CAD and + Peripheral Vascular Disease (AAA)   Rhythm:Regular Rate:Normal     Neuro/Psych negative neurological ROS     GI/Hepatic negative GI ROS, Neg liver ROS,   Endo/Other  negative endocrine ROS  Renal/GU negative Renal ROS     Musculoskeletal   Abdominal   Peds  Hematology negative hematology ROS (+)   Anesthesia Other Findings   Reproductive/Obstetrics                             Lab Results  Component Value Date   WBC 7.6 07/11/2022   HGB 11.7 (L) 07/11/2022   HCT 34.4 (L) 07/11/2022   MCV 102.7 (H) 07/11/2022   PLT 298 07/11/2022   Lab Results  Component Value Date   CREATININE 0.57 07/11/2022   BUN 14 07/11/2022   NA 137 07/11/2022   K 4.2 07/11/2022   CL 103 07/11/2022   CO2 28 07/11/2022    Anesthesia Physical Anesthesia Plan  ASA: 3  Anesthesia Plan: General   Post-op Pain Management: Regional block*, Tylenol PO (pre-op)* and Toradol IV (intra-op)*   Induction: Intravenous  PONV Risk Score and Plan: 3 and Dexamethasone, Ondansetron and Treatment may vary due to age or medical condition  Airway Management Planned: Oral ETT  Additional Equipment: None  Intra-op Plan:   Post-operative Plan: Extubation in OR  Informed Consent: I have reviewed the patients History and Physical, chart, labs and discussed the procedure including the risks, benefits and alternatives for the proposed anesthesia with the patient or authorized representative who has indicated  his/her understanding and acceptance.     Dental advisory given  Plan Discussed with: CRNA  Anesthesia Plan Comments:        Anesthesia Quick Evaluation

## 2022-07-26 NOTE — Anesthesia Procedure Notes (Addendum)
Anesthesia Regional Block: Pectoralis block   Pre-Anesthetic Checklist: , timeout performed,  Correct Patient, Correct Site, Correct Laterality,  Correct Procedure, Correct Position, site marked,  Risks and benefits discussed,  Surgical consent,  Pre-op evaluation,  At surgeon's request and post-op pain management  Laterality: Right  Prep: chloraprep       Needles:  Injection technique: Single-shot  Needle Type: Echogenic Needle     Needle Length: 9cm  Needle Gauge: 21     Additional Needles:   Procedures:,,,, ultrasound used (permanent image in chart),,    Narrative:  Start time: 07/26/2022 8:50 AM End time: 07/26/2022 8:58 AM Injection made incrementally with aspirations every 5 mL.  Performed by: Personally  Anesthesiologist: Suzette Battiest, MD

## 2022-07-26 NOTE — Interval H&P Note (Signed)
History and Physical Interval Note:  07/26/2022 9:17 AM  [name]  has presented today for surgery, with the diagnosis of RIGHT BREAST CANCER.  The various methods of treatment have been discussed with the patient and family. After consideration of risks, benefits and other options for treatment, the patient has consented to  Procedure(s) with comments: RIGHT MASTECTOMY, RIGHT AXILLARY SENTINEL NODE BIOPSY (Right) - GEN & PEC BLOCK RADIOACTIVE SEED GUIDED AXILLARY SENTINEL LYMPH NODE DISSECTION (Right) RIGHT BREAST RECONSTRUCTION WITH PLACEMENT OF TISSUE EXPANDER AND ALLODERM (Right) as a surgical intervention.  The patient's history has been reviewed, patient examined, no change in status, stable for surgery.  I have reviewed the patient's chart and labs.  Questions were answered to the patient's satisfaction.     Arnoldo Hooker Delaney Perona

## 2022-07-26 NOTE — Op Note (Signed)
Operative Note   DATE OF OPERATION: 8.24.23  LOCATION: [facility]  SURGICAL DIVISION: Plastic Surgery  PREOPERATIVE DIAGNOSES:  1. Right breast cancer overlapping sites ER+ metastatic to LN  POSTOPERATIVE DIAGNOSES:  same  PROCEDURE:  1. Right breast reconstruction with tissue expander 2. Acellular dermis (Alloderm) to right chest 300 cm2  SURGEON: Irene Limbo MD MBA  ASSISTANTCora Collum RNFA  ANESTHESIA:  General.   EBL: 147 ml  COMPLICATIONS: None immediate.   INDICATIONS FOR PROCEDURE:  The patient, Ashley Robertson, is a 66 y.o. female born on September 21, 1956, is here for immediate prepectoral tissue expander reconstruction following mastectomy.   FINDINGS: Natrelle 133S FV-13 -T 500 ml tissue expander placed initial fill volume 125 ml air. SN [id number]  DESCRIPTION OF PROCEDURE:  The patient was marked to mark sternal notch, chest midline, anterior axillary lines and inframammary folds. Patient was marked for skin reduction mastectomy with most superior portion nipple areola marked on breast meridian. Vertical limbs marked by breast displacement and set at 8 cm length. The patient was taken to the operating room. SCDs were placed and IV antibiotics were given. The patient's operative site was prepped and draped in a sterile fashion. A time out was performed and all information was confirmed to be correct. I assisted in mastectomy with retraction and exposure. Following completion of mastectomy, the lateral limbs for resection marked and area over lower pole preserved as inferiorly based dermal pedicle. Skin de epithelialized in this area.    The cavity was irrigated with saline solution containing Ancef, gentamicin, and Betadine. Hemostasis was ensured. A 19 Fr drain was placed in subcutaneous position laterally and a 15 Fr drain placed along inframammary fold. Each secured to skin with 2-0 nylon. The tissue expander was prepared on back table prior in insertion. The  expander was filled with air. Perforated acellular dermis was  draped over anterior surface expander. The ADM was then secured to itself over posterior surface of expander with 4-0 chromic. Redundant folds acellular dermis excised so that the ADM lay flat without folds over air filled expander. The expander was secured to medial insertion pectoralis with a 0 vicryl. The superior and lateral tabs also secured to pectoralis muscle with 0-vicryl. The ADM was secured to pectoralis muscle and chest wall along inferior border at inframammary fold with 0 V lock. Laterally the mastectomy flap over posterior axillary line was advanced anteriorly and the subcutaneous tissue and superficial fascia was secured to chest wall with 0-vicryl. The inferiorly based dermal pedicle was redraped superiorly over expander and acellular dermis and secured to pectoralis with interrupted 0-vicryl. Skin closure completed with 3-0 vicryl in fascial layer and 4-0 vicryl in dermis. Skin closure completed with 4-0 monocryl subcuticular and tissue adhesive. Tegaderms applied, followed by dry dressing and breast binder.    The patient was allowed to wake from anesthesia, extubated and taken to the recovery room in satisfactory condition.   SPECIMENS: none  DRAINS: 15 and 19 Fr JP in right breast reconstruction  Irene Limbo, MD [facility]  Office/ physician access line after hours [phone]

## 2022-07-27 ENCOUNTER — Other Ambulatory Visit (HOSPITAL_COMMUNITY): Payer: Self-pay

## 2022-07-27 ENCOUNTER — Encounter (HOSPITAL_COMMUNITY): Payer: Self-pay | Admitting: General Surgery

## 2022-07-27 DIAGNOSIS — C50011 Malignant neoplasm of nipple and areola, right female breast: Secondary | ICD-10-CM | POA: Diagnosis not present

## 2022-07-27 MED ORDER — AMLODIPINE BESYLATE 10 MG PO TABS
10.0000 mg | ORAL_TABLET | Freq: Every day | ORAL | 0 refills | Status: DC
Start: 2022-07-27 — End: 2022-08-09
  Filled 2022-07-27: qty 30, 30d supply, fill #0

## 2022-07-27 NOTE — Anesthesia Postprocedure Evaluation (Signed)
Anesthesia Post Note  Patient: Ashley Robertson  Procedure(s) Performed: RIGHT MASTECTOMY, RIGHT AXILLARY SENTINEL NODE BIOPSY (Right: Breast) RADIOACTIVE SEED GUIDED AXILLARY SENTINEL LYMPH NODE DISSECTION (Right) RIGHT BREAST RECONSTRUCTION WITH PLACEMENT OF TISSUE EXPANDER AND ALLODERM (Right)     Patient location during evaluation: PACU Anesthesia Type: General Level of consciousness: awake and alert Pain management: pain level controlled Vital Signs Assessment: post-procedure vital signs reviewed and stable Respiratory status: spontaneous breathing, nonlabored ventilation, respiratory function stable and patient connected to nasal cannula oxygen Cardiovascular status: blood pressure returned to baseline and stable Postop Assessment: no apparent nausea or vomiting Anesthetic complications: no   No notable events documented.  Last Vitals:  Vitals:   07/27/22 0801 07/27/22 0908  BP: (!) 156/81   Pulse: 63   Resp: 16   Temp: 36.8 C   SpO2: 98% 98%    Last Pain:  Vitals:   07/27/22 0839  TempSrc:   PainSc: 0-No pain                 Tiajuana Amass

## 2022-07-27 NOTE — Discharge Summary (Signed)
Physician Discharge Summary  Patient ID: Ashley Robertson MRN: [id number] DOB/AGE: 1956/05/27 66 y.o.  Admit date: 07/26/2022 Discharge date: 07/27/2022  Admission Diagnoses: Right breast cancer  Discharge Diagnoses:  same  Discharged Condition: stable  Hospital Course: Post operatively patient did well with pain controlled ambulatory with minimal assist and tolerating diet. Instructed on bathing and drain care.  Treatments: surgery: right mastectomy axillary node dissection tissue expander acellular dermis reconstruction 8.24.23  Discharge Exam: Blood pressure (!) 158/92, pulse 75, temperature 98.1 F (36.7 C), temperature source Oral, resp. rate 18, height '5\' 9"'$  (1.753 m), weight 72.6 kg, SpO2 98 %. Incision/Wound: Chest soft Tegaderms in place incisions dry intact drains serosanguinous  Disposition: Discharge disposition: 01-Home or Self Care       Discharge Instructions     Call MD for:  redness, tenderness, or signs of infection (pain, swelling, bleeding, redness, odor or green/yellow discharge around incision site)   Complete by: As directed    Call MD for:  temperature >100.5   Complete by: As directed    Discharge instructions   Complete by: As directed    Ok to remove dressings and shower am 8.26.23. Soap and water ok, pat Tegaderms dry. Do not remove Tegaderms. No creams or ointments over incisions. Do not let drains dangle in shower, attach to lanyard or similar.Strip and record drains twice daily and bring log to clinic visit.  Breast binder or soft compression bra all other times.  Ok to raise arms above shoulders for bathing and dressing.  No house yard work or exercise until cleared by MD.    Recommend ibuprofen with meals to aid with pain control. Also ok to use Tylenol for pain. Patient has all Rx at home. Recommend Miralax or Dulcolax for constipation.   Driving Restrictions   Complete by: As directed    No driving for 2 weeks then no driving if taking  prescription pain medication   Lifting restrictions   Complete by: As directed    No lifting > 5-10 lb until cleared by MD   Resume previous diet   Complete by: As directed       Allergies as of 07/27/2022       Reactions   Lisinopril Other (See Comments), Cough   High potassium   Lipitor [atorvastatin] Other (See Comments)   Myalgias        Medication List     TAKE these medications    albuterol 108 (90 Base) MCG/ACT inhaler Commonly known as: Proventil HFA Inhale 1 to 2 puffs into the lungs every 6 hours as needed   amLODipine 10 MG tablet Commonly known as: NORVASC Take 1 tablet by mouth at bedtime. **NEEDS TO SCHEDULE AN OFFICE VISIT FOR FUTURE REFILLS. 2nd attempt   aspirin 81 MG tablet Take 81 mg by mouth at bedtime.   CALCIUM 500 + D3 PO Take 1 tablet by mouth daily.   doxycycline 100 MG tablet Commonly known as: VIBRA-TABS Take 1 tablet (100 mg total) by mouth 2 times daily. **For use following surgery   FLUoxetine 20 MG capsule Commonly known as: PROZAC Take 1 capsule by mouth daily.   lidocaine-prilocaine cream Commonly known as: EMLA Apply to affected area once What changed:  how much to take how to take this when to take this reasons to take this   magnesium (amino acid chelate) 133 MG tablet Take 1 tablet by mouth at bedtime. PURE magnesium supplement - 120 mg of mag per capsule  methocarbamol 500 MG tablet Commonly known as: ROBAXIN Take 1 tablet by mouth 3 times daily as needed.   OVER THE COUNTER MEDICATION Take 1 capsule by mouth daily. Serrapeptase 120,000 units daily  Reported on 12/27/2015   rosuvastatin 5 MG tablet Commonly known as: CRESTOR Take 1 tablet by mouth daily at bedtime ,except on Monday, Wednesday and Fridays take 2 tablets at bedtime as directed   Trelegy Ellipta 200-62.5-25 MCG/ACT Aepb Generic drug: Fluticasone-Umeclidin-Vilant Inhale 1 puff into the lungs daily   Vitamin D 50 MCG (2000 UT) Caps Take 2,000  Units by mouth daily.        Follow-up Information     Irene Limbo, MD Follow up in 1 week(s).   Specialty: Plastic Surgery Why: as scheduled Contact information: [place] [address] [phone]         Rolm Bookbinder, MD. Schedule an appointment as soon as possible for a visit in 2 week(s).   Specialty: General Surgery Contact information: [address] [place] [address] [phone]                 Signed: Irene Limbo 07/27/2022, 7:10 AM

## 2022-07-27 NOTE — Progress Notes (Signed)
Patient ID: Ashley Robertson, female   DOB: 09/20/1956, 66 y.o.   MRN: [id number] Doing well, drains as expected, discussed surgery. Will call with pathology

## 2022-07-30 DIAGNOSIS — C50911 Malignant neoplasm of unspecified site of right female breast: Secondary | ICD-10-CM | POA: Diagnosis not present

## 2022-07-31 ENCOUNTER — Encounter: Payer: Self-pay | Admitting: *Deleted

## 2022-07-31 DIAGNOSIS — M79631 Pain in right forearm: Secondary | ICD-10-CM | POA: Diagnosis not present

## 2022-07-31 DIAGNOSIS — Z6824 Body mass index (BMI) 24.0-24.9, adult: Secondary | ICD-10-CM | POA: Diagnosis not present

## 2022-07-31 DIAGNOSIS — C50811 Malignant neoplasm of overlapping sites of right female breast: Secondary | ICD-10-CM

## 2022-07-31 DIAGNOSIS — R200 Anesthesia of skin: Secondary | ICD-10-CM | POA: Diagnosis not present

## 2022-07-31 NOTE — Progress Notes (Unsigned)
[place]   Telephone:(336) [phone] Fax:(336) [phone]   Clinic Follow up Note   Patient Care Team: Truitt Merle, MD as PCP - General (Hematology) Leonie Man, MD as PCP - Cardiology (Cardiology) Mauro Kaufmann, RN as Oncology Nurse Navigator Rockwell Germany, RN as Oncology Nurse Navigator Rolm Bookbinder, MD as Consulting Physician (General Surgery) Truitt Merle, MD as Consulting Physician (Hematology) Kyung Rudd, MD as Consulting Physician (Radiation Oncology) 08/01/2022  CHIEF COMPLAINT: Follow up right breast cancer   SUMMARY OF ONCOLOGIC HISTORY: Oncology History Overview Note   Cancer Staging  Malignant neoplasm of overlapping sites of right breast in female, estrogen receptor positive ([place]) Staging form: Breast, AJCC 8th Edition - Clinical stage from 02/09/2022: Stage IIA (cT2, cN1, cM0, G2, ER+, PR-, HER2+) - Signed by Truitt Merle, MD on 02/20/2022    Malignant neoplasm of overlapping sites of right breast in female, estrogen receptor positive ([place])  02/08/2022 Mammogram   CLINICAL DATA:  Acute onset right breast lump.  EXAM: DIGITAL DIAGNOSTIC UNILATERAL RIGHT MAMMOGRAM WITH TOMOSYNTHESIS AND CAD; ULTRASOUND RIGHT BREAST LIMITED  IMPRESSION: 1. Highly suspicious findings mammographically and sonographically. I suspect there is significant malignancy throughout most of the right breast. Discrete masses are seen at 11 o'clock and 6 o'clock.  A single borderline lymph node is identified. This lymph node appears prominent compared to the remainder of the lymph nodes. The skin thickening suggests the possibility of inflammatory breast cancer.   02/09/2022 Cancer Staging   Staging form: Breast, AJCC 8th Edition - Clinical stage from 02/09/2022: Stage IIIA (cT3, cN1, cM0, G2, ER+, PR-, HER2+) - Signed by Truitt Merle, MD on 03/05/2022 Stage prefix: Initial diagnosis Histologic grading system: 3 grade system   02/09/2022 Initial Biopsy   Diagnosis 1. Breast,  right, needle core biopsy, right breast 11 o'clock mass ribbon clip - INVASIVE MAMMARY CARCINOMA - SEE COMMENT 2. Breast, right, needle core biopsy, right breast 6'oclock mass, coil clip - INVASIVE MAMMARY CARCINOMA - SEE COMMENT 3. Lymph node, needle/core biopsy, right axillary lymph node, tribell clip - METASTATIC CARCINOMA INVOLVING NODAL TISSUE Microscopic Comment 1. and 2. The biopsy material shows an infiltrative proliferation of cells arranged linearly and in small clusters. Based on the biopsy, the carcinoma appears Nottingham grade 2 of 3 and measures 1.5 cm in greatest linear extent.  Addendum parts 1 and 2: Immunohistochemistry for E-cadherin is negative consistent with lobular carcinoma.  2. PROGNOSTIC INDICATORS Results: The tumor cells are EQUIVOCAL for Her2 (2+). Her2 by FISH will be performed and the results reported separately. Estrogen Receptor: 100%, POSITIVE, STRONG STAINING INTENSITY Progesterone Receptor: <1%, NEGATIVE Proliferation Marker Ki67: 10%  2. FLUORESCENCE IN-SITU HYBRIDIZATION Results: GROUP 1: HER2 **POSITIVE**   3. PROGNOSTIC INDICATORS Results: By immunohistochemistry, the tumor cells are POSITIVE for Her2 (3+). Estrogen Receptor: 100%, POSITIVE, STRONG STAINING INTENSITY Progesterone Receptor: <1%, NEGATIVE   02/16/2022 Initial Diagnosis   Malignant neoplasm of overlapping sites of right breast in female, estrogen receptor positive ([place])   03/01/2022 Imaging   EXAM: BILATERAL BREAST MRI WITH AND WITHOUT CONTRAST  IMPRESSION: 1. 8.5 x 8.3 x 5.9 cm area of confluent mass-like enhancement in the right breast involving all 4 quadrants and containing 2 biopsy marker clip artifacts, compatible with 4 quadrant biopsy-proven malignancy. 2. Biopsy-proven metastatic lymph node in the right axilla. 3. Possible metastatic intramammary lymph node in the posterior outer right breast just above the level of the nipple. 4. No evidence of malignancy on  the left.  03/01/2022 Genetic Testing   Negative hereditary cancer genetic testing: no pathogenic variants detected in [facility] CustomNext-Cancer +RNAinsight Panel.  Variant of uncertain significance reported in BRIP1 at p.F934V (c.2800T>G). Report date is March 01, 2022.    The CustomNext-Cancer+RNAinsight panel offered by [facility] includes sequencing and rearrangement analysis for the following 47 genes:  APC, ATM, AXIN2, BARD1, BMPR1A, BRCA1, BRCA2, BRIP1, CDH1, CDK4, CDKN2A, CHEK2, DICER1, EPCAM, GREM1, HOXB13, MEN1, MLH1, MSH2, MSH3, MSH6, MUTYH, NBN, NF1, NF2, NTHL1, PALB2, PMS2, POLD1, POLE, PTEN, RAD51C, RAD51D, RECQL, RET, SDHA, SDHAF2, SDHB, SDHC, SDHD, SMAD4, SMARCA4, STK11, TP53, TSC1, TSC2, and VHL.  RNA data is routinely analyzed for use in variant interpretation for all genes.   03/05/2022 Imaging   EXAM: CT CHEST, ABDOMEN, AND PELVIS WITH CONTRAST  IMPRESSION: 1. Asymmetric, masslike density of the glandular tissue of the right breast with overlying skin thickening, in keeping with known primary breast malignancy. 2. Enlarged, ill-defined right axillary lymph node containing a biopsy marking clip, consistent with known nodal metastatic disease. 3. No other evidence of lymphadenopathy or metastatic disease in the chest, abdomen, or pelvis. 4. Emphysema. Background of fine centrilobular nodularity, most concentrated in the lung apices, consistent with smoking-related respiratory bronchiolitis. 5. Aneurysm of the infrarenal abdominal aorta measuring up to 5.4 x 5.3 cm with a large burden of eccentric mural thrombus. Recommend follow-up CT/MR every 6 months and vascular consultation. This recommendation follows ACR consensus guidelines: White Paper of the ACR Incidental Findings Committee II on Vascular Findings. J Am Coll Radiol 2013; 10:789-794. 6. Coronary artery disease.   03/05/2022 Imaging   EXAM: NUCLEAR MEDICINE WHOLE BODY BONE SCAN  IMPRESSION: No evidence of bony  metastatic disease.   03/07/2022 -  Chemotherapy   Patient is on Treatment Plan : BREAST  Docetaxel + Carboplatin + Trastuzumab + Pertuzumab  (TCHP) q21d      07/26/2022 Surgery   Right mastectomy with axillary node seed guided excision converted to axillary node dissection with reconstruction by Drs. Donne Hazel and Thimmappa   07/26/2022 Pathology Results   FINAL MICROSCOPIC DIAGNOSIS:   A. LYMPH NODE, RIGHT AXILLARY TARGETED, EXCISION:  - Metastatic carcinoma involving one lymph node, 2 cm (1/1).  - Focal extranodal extension.  - Biopsy site and biopsy clip.   B. BREAST, RIGHT, MASTECTOMY:  - Invasive lobular carcinoma, 9.5 cm (ypT3).  - Carcinoma involves dermis of nipple.  - All surgical margins negative for carcinoma.  - One lymph node negative for metastatic carcinoma (0/1).  - Biopsy sites and biopsy clips.  - See oncology table.   C. LYMPH NODE, RIGHT AXILLARY, SENTINEL, EXCISION:  - One lymph node negative for metastatic carcinoma (0/1).   D. LYMPH NODE, RIGHT AXILLARY, SENTINEL, EXCISION:  - Metastatic carcinoma in one lymph node, 0.5 cm. (1/1).   E. LYMPH NODE, RIGHT AXILLARY, SENTINEL, EXCISION:  - One lymph node negative for metastatic carcinoma (0/1).    07/26/2022 Cancer Staging   Cancer Staging  Malignant neoplasm of overlapping sites of right breast in female, estrogen receptor positive ([place]) Staging form: Breast, AJCC 8th Edition - Clinical stage from 02/09/2022: Stage IIIA (cT3, cN1, cM0, G2, ER+, PR-, HER2+) - Signed by Truitt Merle, MD on 03/05/2022 Stage prefix: Initial diagnosis Histologic grading system: 3 grade system - Pathologic stage from 07/26/2022: Stage IIIA (pT3, pN1a, cM0, G2, ER+, PR-, HER2+) - Signed by Alla Feeling, NP on 08/01/2022 Histologic grading system: 3 grade system    07/26/2022 Cancer Staging   Staging form: Breast, AJCC  8th Edition - Pathologic stage from 07/26/2022: Stage IIIA (pT3, pN1a, cM0, G2, ER+, PR-, HER2+) - Signed by Alla Feeling, NP on 08/01/2022 Histologic grading system: 3 grade system     CURRENT THERAPY: Neoadjuvant TCHP, q21 days, started 03/07/22 and right mastectomy 07/26/22; switch to Kadcyla q21 days starting ~9/20  INTERVAL HISTORY: Ms. Almanzar returns for follow up as scheduled. Last seen by Dr. Burr Medico 06/20/22 and completed last cycle of TCHP, then maintenance herceptin/perjeta on 8//9. She proceeded with R mastectomy 07/26/22 with Dr. Donne Hazel and immediate reconstruction with Dr. Iran Planas. Path showed residual invasive lobular carcinoma measuring 9.5 cm (ypT3) involving the dermis of nipple with metastasis 2/5 + LNs (ypN1a), clear margins, and ER+/PR-HER2+    Today she presents by herself.  She feels that surgery went really well and is pleased, pain is minimal and managed with OTCs.  She has 3 drains in place, numbers 1 and 3 putting out 10 cc/day, drain to put out 80-90 cc/day.  She will see Dr. Iran Planas tomorrow and Dr. Donne Hazel in a month.  She has no concerns with her incisions, denies fever/chills.  She tolerated last Herceptin/Perjeta well with no specific complaints.  She also sees Dr. Donnetta Hutching for carotid bruit, agrees to see him for AAA.   All other systems were reviewed with the patient and are negative.  MEDICAL HISTORY:  Past Medical History:  Diagnosis Date   Allergy    Lisinopril and lipitor   Anxiety and depression    Aortic atherosclerosis ([place]) 03/2020   CT Chest: 2 V (LAD & LCx) Coronary Atherosclerosis, Aortic Atherosclerosis (no aneurysm).  Mild centrilobular emphysema with mild diffuse bronchial thickening; several scattered small solitary pulmonary nodules (largest 5.6 mm in anterior left upper lobe)   Breast cancer ([place]) 12/2021   Right breast ILC   Carotid artery plaque, bilateral 10/2015   Mild to moderate plaque L>R without significant stenosis   COPD (chronic obstructive pulmonary disease) ([facility])    Coronary Artery Calcification - Score 79    Coronary Calcium Score 79.   LAD and circumflex calcification noted.  Normal ascending aorta with mild calcification.   Current every day smoker    pt quit smoking April 2023   Emphysema lung [facility])    Noted on chest CT   Family history of breast cancer 02/21/2022   Family history of prostate cancer 02/21/2022   Hyperlipidemia    Hypertension    Controlled with amlodipine   Prediabetes    Skin cancer 2021   Remove 2022    SURGICAL HISTORY: Past Surgical History:  Procedure Laterality Date   BREAST BIOPSY Right    times 3   BREAST RECONSTRUCTION WITH PLACEMENT OF TISSUE EXPANDER AND ALLODERM Right 07/26/2022   Procedure: RIGHT BREAST RECONSTRUCTION WITH PLACEMENT OF TISSUE EXPANDER AND ALLODERM;  Surgeon: Irene Limbo, MD;  Location: [facility];  Service: Plastics;  Laterality: Right;   [place]   Rhinoplasty   MASTECTOMY W/ SENTINEL NODE BIOPSY Right 07/26/2022   Procedure: RIGHT MASTECTOMY, RIGHT AXILLARY SENTINEL NODE BIOPSY;  Surgeon: Rolm Bookbinder, MD;  Location: [facility];  Service: General;  Laterality: Right;  GEN & PEC BLOCK   PORTACATH PLACEMENT Left 03/06/2022   Procedure: INSERTION PORT-A-CATH;  Surgeon: Rolm Bookbinder, MD;  Location: [facility];  Service: General;  Laterality: Left;   RADIOACTIVE SEED GUIDED AXILLARY SENTINEL LYMPH NODE Right 07/26/2022   Procedure: RADIOACTIVE SEED GUIDED AXILLARY SENTINEL LYMPH  NODE DISSECTION;  Surgeon: Rolm Bookbinder, MD;  Location: [facility];  Service: General;  Laterality: Right;   [place] ECHOCARDIOGRAM  10/2016   EF 55 to 60%.  Normal systolic and diastolic function.  No ASD/PFO   TUBAL LIGATION  1991    I have reviewed the social history and family history with the patient and they are unchanged from previous note.  ALLERGIES:  is allergic to lisinopril and lipitor [atorvastatin].  MEDICATIONS:  Current Outpatient Medications  Medication  Sig Dispense Refill   albuterol (PROVENTIL HFA) 108 (90 Base) MCG/ACT inhaler Inhale 1 to 2 puffs into the lungs every 6 hours as needed 8.5 g 2   amLODipine (NORVASC) 10 MG tablet Take 1 tablet by mouth at bedtime. **NEEDS TO SCHEDULE AN OFFICE VISIT FOR FUTURE REFILLS. 2nd attempt 30 tablet 0   aspirin 81 MG tablet Take 81 mg by mouth at bedtime.     Calcium Carb-Cholecalciferol (CALCIUM 500 + D3 PO) Take 1 tablet by mouth daily.     Cholecalciferol (VITAMIN D) 50 MCG (2000 UT) CAPS Take 2,000 Units by mouth daily.     doxycycline (VIBRA-TABS) 100 MG tablet Take 1 tablet (100 mg total) by mouth 2 times daily. **For use following surgery 12 tablet 0   FLUoxetine (PROZAC) 20 MG capsule Take 1 capsule by mouth daily. 90 capsule 1   Fluticasone-Umeclidin-Vilant (TRELEGY ELLIPTA) 200-62.5-25 MCG/ACT AEPB Inhale 1 puff into the lungs daily 60 each 2   lidocaine-prilocaine (EMLA) cream Apply to affected area once (Patient taking differently: Apply 1 Application topically as needed (port access). Apply to affected area once) 30 g 3   methocarbamol (ROBAXIN) 500 MG tablet Take 1 tablet by mouth 3 times daily as needed. 15 tablet 0   OVER THE COUNTER MEDICATION Take 1 capsule by mouth daily. Serrapeptase 120,000 units daily  Reported on 12/27/2015     rosuvastatin (CRESTOR) 5 MG tablet Take 1 tablet by mouth daily at bedtime ,except on Monday, Wednesday and Fridays take 2 tablets at bedtime as directed 180 tablet 3   Specialty Vitamins Products (MAGNESIUM, AMINO ACID CHELATE,) 133 MG tablet Take 1 tablet by mouth at bedtime. PURE magnesium supplement - 120 mg of mag per capsule     No current facility-administered medications for this visit.   Facility-Administered Medications Ordered in Other Visits  Medication Dose Route Frequency Provider Last Rate Last Admin   sodium chloride flush (NS) 0.9 % injection 10 mL  10 mL Intracatheter PRN Truitt Merle, MD   10 mL at 08/01/22 1255    PHYSICAL  EXAMINATION: ECOG PERFORMANCE STATUS: 0 - Asymptomatic  Vitals:   08/01/22 1004  BP: (!) 160/80  Pulse: 68  Resp: 17  Temp: 97.6 F (36.4 C)  SpO2: 99%   Filed Weights   08/01/22 1004  Weight: 159 lb 3.2 oz (72.2 kg)    GENERAL:alert, no distress and comfortable SKIN: No rash EYES: sclera clear LUNGS: clear with normal breathing effort HEART: regular rate & rhythm, no lower extremity edema ABDOMEN:abdomen soft, non-tender and normal bowel sounds NEURO: alert & oriented x 3 with fluent speech, no focal motor/sensory deficits Breast exam deferred PAC without erythema  LABORATORY DATA:  I have reviewed the data as listed    Latest Ref Rng & Units 08/01/2022    9:47 AM 07/11/2022    8:09 AM 06/20/2022    8:49 AM  CBC  WBC 4.0 - 10.5 K/uL  5.9  7.6  10.3   Hemoglobin 12.0 - 15.0 g/dL 12.0  11.7  10.6   Hematocrit 36.0 - 46.0 % 35.7  34.4  30.7   Platelets 150 - 400 K/uL 228  298  298         Latest Ref Rng & Units 08/01/2022    9:47 AM 07/11/2022    8:09 AM 06/20/2022    8:49 AM  CMP  Glucose 70 - 99 mg/dL 114  118  137   BUN 8 - 23 mg/dL '10  14  15   ' Creatinine 0.44 - 1.00 mg/dL 0.57  0.57  0.50   Sodium 135 - 145 mmol/L 136  137  136   Potassium 3.5 - 5.1 mmol/L 3.9  4.2  4.1   Chloride 98 - 111 mmol/L 104  103  104   CO2 22 - 32 mmol/L '28  28  27   ' Calcium 8.9 - 10.3 mg/dL 9.4  9.1  9.3   Total Protein 6.5 - 8.1 g/dL 6.5  6.9  6.5   Total Bilirubin 0.3 - 1.2 mg/dL 0.4  0.3  0.3   Alkaline Phos 38 - 126 U/L 96  98  90   AST 15 - 41 U/L '16  13  14   ' ALT 0 - 44 U/L '10  10  12       ' RADIOGRAPHIC STUDIES: I have personally reviewed the radiological images as listed and agreed with the findings in the report. No results found.   ASSESSMENT & PLAN: VINAYA SANCHO is a 66 y.o. post-menopausal female with    1. Malignant neoplasm of overlapping sites of right breast, invasive lobular carcinoma, Stage IIIA, c(T3, N1), ER+/PR-/HER2+, Grade 2  -presented with new  right breast mass. Right MM and [place] on 02/08/22 showed malignancy throughout right breast with discrete masses at 11 and 6 o'clock, and one borderline lymph node. Biopsy on 02/09/22 revealed invasive lobular carcinoma to both sites and metastatic carcinoma in lymph node. She has mass like hard tissue in most part of her right breast but no clinical concerns of inflammatory breast cancer, she also has a large palpable right axillary lymph node. -breast MRI on 03/01/22 showed: 8.5 cm area of confluent mass-like enhancement in right breast involving all 4 quadrants; biopsy-proven metastatic lymph node in right axilla; possible metastatic intramammary lymph node in posterior outer right breast; no evidence of malignancy on left. -staging CT CAP and bone scan on 03/05/22 were negative for metastatic disease. -baseline echo on 03/05/22 showed EF of 55-60%, normal. -She completed 6 cycles of neoadjuvant TCHP on 03/07/22 -06/20/2022 and proceeded with maintenance Herceptin/Perjeta starting 8/9. She tolerated [place] very well with no major side effects.  -right breast US on 05/14/22 showed grossly stable appearance of irregular 2.9 cm mass at 11 o'clock, though exact measurements difficult due to diffuse nature of mass. Clinical exam 05/30/22 showed stable large central right breast mass.  -She proceeded with right mastectomy by Dr. Donne Hazel and immediate reconstruction with tissue expander by Dr. Iran Planas on 07/26/2022, recovering very well.  She has 3 drains in place -I reviewed her surgical path which shows residual lobular carcinoma in the right breast involving the dermis of the nipple, with clear margins, and 2 of 5 positive lymph nodes.  There was minimal treatment effect.  This remains ypT3 ypN1a stage III.  Again ER strongly positive, PR negative, HER2 positive with a Ki-67 of 10%. -We will review in tumor board whether she  needs additional axillary lymph node dissection surgery -Given the residual disease and positive  lymph nodes she is at high risk for recurrence, the recommendation is to switch to adjuvant Kadcyla every 3 weeks to complete a year.  The potential benefit and side effects including but not limited to hematologic toxicities (neutropenia, anemia, and thrombocytopenia, fatigue, neuropathy, and nausea/vomiting/ were reviewed, she agrees to proceed -We discussed she is also a candidate for adjuvant HER2 antibody [name] in the future, she is open to that. -She is scheduled to meet with Dr. Lisbeth Renshaw for postmastectomy radiation 08/23/2022 -Anticipate starting antiestrogen after she completes radiation. -Labs reviewed, adequate to proceed with Herceptin and Perjeta today; will switch to Kadcyla in 3 weeks -PT seen with Dr. Burr Medico   2. Genetics -she has a family history of breast cancer in a paternal aunt and a niece, liver cancer in her maternal grandfather, and prostate cancer in her father. -testing performed on 02/21/22 was negative, with a VUS in [place]   3. Bone Health  -Her most recent DEXA was on 04/29/20 showing osteopenia (T-score of -2.1 at L1-3).   4. COPD and Smoking Cessation -she reports she quit smoking on 03/01/22. She notes she is currently using a non-nicotine vape, which has aided her in quitting.   5. HTN and prediabetes, anxiety -Continue medication, and follow-up with PCP -BP occasionally high in clinic, overall stable on treatment on chemotherapy   6.  Infrarenal abdominal aortic aneurysm -Seen on breast cancer staging CT, measures up to 5.4 x 5.3 cm with a large burden of eccentric mural thrombus -Per patient she sees Dr. Donnetta Hutching for carotid bruit, will see him for this as well, I will send a message  Plan: -Surgical path and today's labs reviewed -Discuss Dr. Donne Hazel in tumor board re: additional axillary lymph node dissection surgery -Proceed with Herceptin/Perjeta today, then change to kadcyla in 3 weeks. She consented -Proceed with postmastectomy RT, consult  9/21 -Message to Dr. Donnetta Hutching re: AAA -F/up in 3 weeks -Pt seen with Dr. Burr Medico  -Msg to care team    All questions were answered. The patient knows to call the clinic with any problems, questions or concerns. No barriers to learning were detected.      Alla Feeling, NP 08/01/22    Addendum  I have seen the patient, examined her. I agree with the assessment and and plan and have edited the notes.   Sheyenne is recovering well from surgery.  I reviewed her surgical pathology results with her in detail.  She unfortunately did not respond much to neoadjuvant chemo, still has significant residual disease, including 2 positive lymph nodes.  We discussed with Dr. Donne Hazel, who has done ALND on her, no more surgery is needed.  I discussed adjuvant therapy since, I recommend Kadcyla weeks for a year, CCN guidelines.  Potential benefit and side effects were discussed with her in details, she agrees with the plan.  We will also screen her for the clinical trial [employer] Her2 to see if she is eligible.  All questions were answered. Will proceed with HP today, and change treatment next cycle.    Truitt Merle  08/01/2022

## 2022-08-01 ENCOUNTER — Other Ambulatory Visit (HOSPITAL_COMMUNITY): Payer: Self-pay

## 2022-08-01 ENCOUNTER — Inpatient Hospital Stay: Payer: PPO

## 2022-08-01 ENCOUNTER — Inpatient Hospital Stay (HOSPITAL_BASED_OUTPATIENT_CLINIC_OR_DEPARTMENT_OTHER): Payer: PPO | Admitting: Nurse Practitioner

## 2022-08-01 ENCOUNTER — Encounter: Payer: Self-pay | Admitting: Nurse Practitioner

## 2022-08-01 ENCOUNTER — Other Ambulatory Visit: Payer: Self-pay

## 2022-08-01 VITALS — BP 160/80 | HR 68 | Temp 97.6°F | Resp 17 | Ht 69.0 in | Wt 159.2 lb

## 2022-08-01 VITALS — BP 179/85 | HR 64 | Temp 97.6°F | Resp 18

## 2022-08-01 DIAGNOSIS — C50811 Malignant neoplasm of overlapping sites of right female breast: Secondary | ICD-10-CM

## 2022-08-01 DIAGNOSIS — Z170 Estrogen receptor positive status [ER+]: Secondary | ICD-10-CM | POA: Diagnosis not present

## 2022-08-01 DIAGNOSIS — Z5112 Encounter for antineoplastic immunotherapy: Secondary | ICD-10-CM | POA: Diagnosis not present

## 2022-08-01 DIAGNOSIS — Z95828 Presence of other vascular implants and grafts: Secondary | ICD-10-CM

## 2022-08-01 LAB — CMP (CANCER CENTER ONLY)
ALT: 10 U/L (ref 0–44)
AST: 16 U/L (ref 15–41)
Albumin: 3.9 g/dL (ref 3.5–5.0)
Alkaline Phosphatase: 96 U/L (ref 38–126)
Anion gap: 4 — ABNORMAL LOW (ref 5–15)
BUN: 10 mg/dL (ref 8–23)
CO2: 28 mmol/L (ref 22–32)
Calcium: 9.4 mg/dL (ref 8.9–10.3)
Chloride: 104 mmol/L (ref 98–111)
Creatinine: 0.57 mg/dL (ref 0.44–1.00)
GFR, Estimated: >60 mL/min (ref >60)
Glucose, Bld: 114 mg/dL — ABNORMAL HIGH (ref 70–99)
Potassium: 3.9 mmol/L (ref 3.5–5.1)
Sodium: 136 mmol/L (ref 135–145)
Total Bilirubin: 0.4 mg/dL (ref 0.3–1.2)
Total Protein: 6.5 g/dL (ref 6.5–8.1)

## 2022-08-01 LAB — CBC WITH DIFFERENTIAL (CANCER CENTER ONLY)
Abs Immature Granulocytes: 0.02 10*3/uL (ref 0.00–0.07)
Basophils Absolute: 0.1 10*3/uL (ref 0.0–0.1)
Basophils Relative: 1 %
Eosinophils Absolute: 0.4 10*3/uL (ref 0.0–0.5)
Eosinophils Relative: 7 %
HCT: 35.7 % — ABNORMAL LOW (ref 36.0–46.0)
Hemoglobin: 12 g/dL (ref 12.0–15.0)
Immature Granulocytes: 0 %
Lymphocytes Relative: 15 %
Lymphs Abs: 0.9 10*3/uL (ref 0.7–4.0)
MCH: 33.7 pg (ref 26.0–34.0)
MCHC: 33.6 g/dL (ref 30.0–36.0)
MCV: 100.3 fL — ABNORMAL HIGH (ref 80.0–100.0)
Monocytes Absolute: 0.7 10*3/uL (ref 0.1–1.0)
Monocytes Relative: 11 %
Neutro Abs: 3.9 10*3/uL (ref 1.7–7.7)
Neutrophils Relative %: 66 %
Platelet Count: 228 10*3/uL (ref 150–400)
RBC: 3.56 MIL/uL — ABNORMAL LOW (ref 3.87–5.11)
RDW: 12.6 % (ref 11.5–15.5)
WBC Count: 5.9 10*3/uL (ref 4.0–10.5)
nRBC: 0 % (ref 0.0–0.2)

## 2022-08-01 LAB — SURGICAL PATHOLOGY

## 2022-08-01 MED ORDER — TRASTUZUMAB-DKST CHEMO 150 MG IV SOLR
6.0000 mg/kg | Freq: Once | INTRAVENOUS | Status: AC
  Administered 2022-08-01: 420 mg via INTRAVENOUS
  Filled 2022-08-01: qty 20

## 2022-08-01 MED ORDER — ACETAMINOPHEN 325 MG PO TABS
650.0000 mg | ORAL_TABLET | Freq: Once | ORAL | Status: AC
  Administered 2022-08-01: 650 mg via ORAL
  Filled 2022-08-01: qty 2

## 2022-08-01 MED ORDER — SODIUM CHLORIDE 0.9% FLUSH
10.0000 mL | Freq: Once | INTRAVENOUS | Status: AC
  Administered 2022-08-01: 10 mL

## 2022-08-01 MED ORDER — HEPARIN SOD (PORK) LOCK FLUSH 100 UNIT/ML IV SOLN
500.0000 [IU] | Freq: Once | INTRAVENOUS | Status: AC | PRN
  Administered 2022-08-01: 500 [IU]

## 2022-08-01 MED ORDER — SODIUM CHLORIDE 0.9% FLUSH
10.0000 mL | INTRAVENOUS | Status: DC | PRN
  Administered 2022-08-01: 10 mL

## 2022-08-01 MED ORDER — SODIUM CHLORIDE 0.9 % IV SOLN: Freq: Once | INTRAVENOUS | Status: AC

## 2022-08-01 MED ORDER — DIPHENHYDRAMINE HCL 25 MG PO CAPS
50.0000 mg | ORAL_CAPSULE | Freq: Once | ORAL | Status: AC
  Administered 2022-08-01: 50 mg via ORAL
  Filled 2022-08-01: qty 2

## 2022-08-01 MED ORDER — SODIUM CHLORIDE 0.9 % IV SOLN
420.0000 mg | Freq: Once | INTRAVENOUS | Status: AC
  Administered 2022-08-01: 420 mg via INTRAVENOUS
  Filled 2022-08-01: qty 14

## 2022-08-01 NOTE — Progress Notes (Signed)
Patient stated that she does not wait 30 minutes following perjeta. VSS and ambulatory upon discharge.

## 2022-08-01 NOTE — Patient Instructions (Signed)
[facility]  Discharge Instructions: Thank you for choosing [facility] to provide your oncology and hematology care.   If you have a lab appointment with the [facility], please go directly to the [facility] and check in at the registration area.   Wear comfortable clothing and clothing appropriate for easy access to any Portacath or PICC line.   We strive to give you quality time with your provider. You may need to reschedule your appointment if you arrive late (15 or more minutes).  Arriving late affects you and other patients whose appointments are after yours.  Also, if you miss three or more appointments without notifying the office, you may be dismissed from the clinic at the provider's discretion.      For prescription refill requests, have your pharmacy contact our office and allow 72 hours for refills to be completed.    Today you received the following chemotherapy and/or immunotherapy agents: trastuzumab-dkst, pertuzumab      To help prevent nausea and vomiting after your treatment, we encourage you to take your nausea medication as directed.  BELOW ARE SYMPTOMS THAT SHOULD BE REPORTED IMMEDIATELY: *FEVER GREATER THAN 100.4 F (38 C) OR HIGHER *CHILLS OR SWEATING *NAUSEA AND VOMITING THAT IS NOT CONTROLLED WITH YOUR NAUSEA MEDICATION *UNUSUAL SHORTNESS OF BREATH *UNUSUAL BRUISING OR BLEEDING *URINARY PROBLEMS (pain or burning when urinating, or frequent urination) *BOWEL PROBLEMS (unusual diarrhea, constipation, pain near the anus) TENDERNESS IN MOUTH AND THROAT WITH OR WITHOUT PRESENCE OF ULCERS (sore throat, sores in mouth, or a toothache) UNUSUAL RASH, SWELLING OR PAIN  UNUSUAL VAGINAL DISCHARGE OR ITCHING   Items with * indicate a potential emergency and should be followed up as soon as possible or go to the Emergency Department if any problems should occur.  Please show the CHEMOTHERAPY ALERT CARD or IMMUNOTHERAPY ALERT  CARD at check-in to the Emergency Department and triage nurse.  Should you have questions after your visit or need to cancel or reschedule your appointment, please contact [facility]  Dept: [phone]  and follow the prompts.  Office hours are 8:00 a.m. to 4:30 p.m. Monday - Friday. Please note that voicemails left after 4:00 p.m. may not be returned until the following business day.  We are closed weekends and major holidays. You have access to a nurse at all times for urgent questions. Please call the main number to the clinic Dept: [phone] and follow the prompts.   For any non-urgent questions, you may also contact your provider using MyChart. We now offer e-Visits for anyone 67 and older to request care online for non-urgent symptoms. For details visit [email or web address].   Also download the MyChart app! Go to the app store, search "MyChart", open the app, select [facility], and log in with your MyChart username and password.  Masks are optional in the cancer centers. If you would like for your care team to wear a mask while they are taking care of you, please let them know. You may have one support person who is at least 67 years old accompany you for your appointments.

## 2022-08-02 DIAGNOSIS — M25532 Pain in left wrist: Secondary | ICD-10-CM | POA: Diagnosis not present

## 2022-08-03 ENCOUNTER — Encounter: Payer: Self-pay | Admitting: *Deleted

## 2022-08-03 ENCOUNTER — Other Ambulatory Visit: Payer: Self-pay | Admitting: Hematology

## 2022-08-03 ENCOUNTER — Telehealth: Payer: Self-pay | Admitting: *Deleted

## 2022-08-03 DIAGNOSIS — C50811 Malignant neoplasm of overlapping sites of right female breast: Secondary | ICD-10-CM

## 2022-08-03 NOTE — Telephone Encounter (Signed)
Trial:  CompassHER2 Residual Disease, A Double-Blinded, Phase III Randomized Trial of T-DM1 and Placebo Compared with T-DMI and Tucatinib:  Patient Ashley Robertson was identified by Dr. Burr Medico as a potential candidate for the above listed study.  This Clinical Research Nurse spoke with CONITA AMENTA, [id number], to discuss participation in the above listed research study.  Approximately 10 minutes were spent with the patient discussing the study.  A copy of the informed consent document was emailed to the patient.  Patient reads, speaks, and understands [place].  Patient was encouraged to review the consent form at their convenience with their support network, including other care providers. Contact information for this Nurse was included in the email.    Reviewed potential timeline for starting on study in 3 weeks if patient decides to participate.  Patient stated she did not like the idea of the randomization and potential for getting placebo. She wants to talk to Dr. Donne Hazel about it next week and agreed for research nurse to follow up with her next Friday 08/10/22.  Thanked patient for her time and encouraged to contact research nurse if any questions before next Friday. She verbalized understanding.  Foye Spurling, BSN, RN, [place] Nurse II 08/03/2022

## 2022-08-03 NOTE — Progress Notes (Signed)
DISCONTINUE SELECTED CLINICAL TRIAL - Breast  Trial: The CompassHER2 Trials (Comprehensive Use of Pathologic Response Assessment to Optimize Therapy in HER2-Positive Breast Cancer) CompassHER2 Residual Disease (RD), a Double-Blinded, Phase III Randomized Trial of T-DM1 Compared With T-DM1 and  Tucatinib  **Trial eligibility and accrual should be confirmed by your research team**  REASON: Other Reason PRIOR TREATMENT: Trial: The CompassHER2 Trials (Comprehensive Use of Pathologic Response Assessment to Optimize Therapy in HER2-Positive Breast Cancer) CompassHER2 Residual Disease (RD), a Double-Blinded, Phase III Randomized Trial of T-DM1 Compared With  T-DM1 and Tucatinib TREATMENT RESPONSE: Unable to Evaluate  START ON PATHWAY REGIMEN - Breast     A cycle is every 21 days:     Ado-trastuzumab emtansine   **Always confirm dose/schedule in your pharmacy ordering system**  Patient Characteristics: Post-Neoadjuvant Therapy and Resection, HER2 Positive, ER Positive, Residual Disease, Adjuvant Targeted Therapy After Neoadjuvant Chemo/Targeted Therapy Therapeutic Status: Post-Neoadjuvant Therapy and Resection Residual Invasive Disease Post-Neoadjuvant Therapy<= Yes ER Status: Positive (+) HER2 Status: Positive (+) PR Status: Negative (-) Intent of Therapy: Curative Intent, Discussed with Patient

## 2022-08-07 NOTE — Progress Notes (Signed)
Cardiology Office Note:    Date:  08/09/2022   ID:  Ashley Robertson, DOB 1956/05/22, MRN [id number]  PCP:  Truitt Merle, [place] Cardiologist: Glenetta Hew, MD   Reason for visit: 1 year follow-up  History of Present Illness:    Ashley Robertson is a 66 y.o. female with a hx of CAD, hypertension, tobacco use - quit 01/2022, carotid bruits without significant disease on duplex.  Recently diagnosed with Right breast cancer overlapping sites ER+ metastatic to LN - s/p R mastectomy 07/2022.  During work-up, CT showed aneurysm of the infrarenal abdominal aorta measuring up to 5.4 x 5.3 cm with a large burden of eccentric mural thrombus -- CT/MR every 6 months and vascular consult recommended.   Patient is a retired Nature conservation officer. 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  She last saw Dr. Ellyn Hack in May 2022.  She is doing well without cardiac symptoms.  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  Unable to walk long distances secondary to knee pain.  While on chemo, she has had serial echoes.  Last echo August 2023 with EF 55 to 60%, normal RV, no valve disease.  Today, she states she is here for 1 year follow-up particularly to refill her amlodipine.  She is doing well from a heart standpoint without chest pain, shortness of breath, PND, orthopnea, lower extreme edema, palpitations, lightheadedness and syncope.  She states her blood pressure has been recently high at doctor visits.  In normal range today.  She does not monitor it regularly at home.  Otherwise, she states no myalgias with Crestor 5 mg 4 days a week and 10 mg 3 days a week.  She states she cannot tolerate Crestor 10 mg every day.  She fills me in that she stopped smoking after breast cancer diagnosis.  She just had a mastectomy 2 weeks ago.  She is down from 3 drains to 1 drain and has a follow-up appointment today.  She finished chemotherapy on August 19.   She continues antibody infusions every 3 weeks.  She states they are planning to add a different chemotherapy soon as the first did not shrink her lymph nodes.  She states cancer cells were found in 2 of the 5 lymph node biopsies.  During breast cancer work-up, she was found to have an infrarenal abdominal aortic aneurysm.  She has consult with vascular next week.     Past Medical History:  Diagnosis Date   Allergy    Lisinopril and lipitor   Anxiety and depression    Aortic atherosclerosis ([place]) 03/2020   CT Chest: 2 V (LAD & LCx) Coronary Atherosclerosis, Aortic Atherosclerosis (no aneurysm).  Mild centrilobular emphysema with mild diffuse bronchial thickening; several scattered small solitary pulmonary nodules (largest 5.6 mm in anterior left upper lobe)   Breast cancer ([place]) 12/2021   Right breast ILC   Carotid artery plaque, bilateral 10/2015   Mild to moderate plaque L>R without significant stenosis   COPD (chronic obstructive pulmonary disease) (HCC)    Coronary Artery Calcification - Score 79    Coronary Calcium Score 79.  LAD and circumflex calcification noted.  Normal ascending aorta with mild calcification.   Current every day smoker    pt quit smoking April 2023   Emphysema lung [facility])    Noted on chest CT   Family history of breast cancer 02/21/2022  Family history of prostate cancer 02/21/2022   Hyperlipidemia    Hypertension    Controlled with amlodipine   Prediabetes    Skin cancer 2021   Remove 2022    Past Surgical History:  Procedure Laterality Date   BREAST BIOPSY Right    times 3   BREAST RECONSTRUCTION WITH PLACEMENT OF TISSUE EXPANDER AND ALLODERM Right 07/26/2022   Procedure: RIGHT BREAST RECONSTRUCTION WITH PLACEMENT OF TISSUE EXPANDER AND ALLODERM;  Surgeon: Irene Limbo, MD;  Location: [facility];  Service: Plastics;  Laterality: Right;   [place]   Rhinoplasty   MASTECTOMY W/ SENTINEL NODE BIOPSY Right  07/26/2022   Procedure: RIGHT MASTECTOMY, RIGHT AXILLARY SENTINEL NODE BIOPSY;  Surgeon: Rolm Bookbinder, MD;  Location: [facility];  Service: General;  Laterality: Right;  GEN & PEC BLOCK   PORTACATH PLACEMENT Left 03/06/2022   Procedure: INSERTION PORT-A-CATH;  Surgeon: Rolm Bookbinder, MD;  Location: [facility];  Service: General;  Laterality: Left;   RADIOACTIVE SEED GUIDED AXILLARY SENTINEL LYMPH NODE Right 07/26/2022   Procedure: RADIOACTIVE SEED GUIDED AXILLARY SENTINEL LYMPH NODE DISSECTION;  Surgeon: Rolm Bookbinder, MD;  Location: [facility];  Service: General;  Laterality: Right;   [place] ECHOCARDIOGRAM  10/2016   EF 55 to 60%.  Normal systolic and diastolic function.  No ASD/PFO   TUBAL LIGATION  1991    Current Medications: Current Meds  Medication Sig   albuterol (PROVENTIL HFA) 108 (90 Base) MCG/ACT inhaler Inhale 1 to 2 puffs into the lungs every 6 hours as needed   aspirin 81 MG tablet Take 81 mg by mouth at bedtime.   Calcium Carb-Cholecalciferol (CALCIUM 500 + D3 PO) Take 1 tablet by mouth daily.   Cholecalciferol (VITAMIN D) 50 MCG (2000 UT) CAPS Take 2,000 Units by mouth daily.   FLUoxetine (PROZAC) 20 MG capsule Take 1 capsule by mouth daily.   Fluticasone-Umeclidin-Vilant (TRELEGY ELLIPTA) 200-62.5-25 MCG/ACT AEPB Inhale 1 puff into the lungs daily   methocarbamol (ROBAXIN) 500 MG tablet Take 1 tablet by mouth 3 times daily as needed.   OVER THE COUNTER MEDICATION Take 1 capsule by mouth daily. Serrapeptase 120,000 units daily  Reported on 12/27/2015   rosuvastatin (CRESTOR) 5 MG tablet Take 1 tablet by mouth daily at bedtime ,except on Monday, Wednesday and Fridays take 2 tablets at bedtime as directed   Specialty Vitamins Products (MAGNESIUM, AMINO ACID CHELATE,) 133 MG tablet Take 1 tablet by mouth at bedtime. PURE magnesium supplement - 120 mg of mag per capsule   [DISCONTINUED] amLODipine (NORVASC) 10 MG  tablet Take 1 tablet by mouth at bedtime. **NEEDS TO SCHEDULE AN OFFICE VISIT FOR FUTURE REFILLS. 2nd attempt     Allergies:   Lisinopril and Lipitor [atorvastatin]   Social History   Socioeconomic History   Marital status: Married    Spouse name: Not on file   Number of children: 2   Years of education: Not on file   Highest education level: Bachelor's degree (e.g., BA, AB, BS)  Occupational History   Occupation: OR Optician, dispensing: [employer]    Comment: Retired  Tobacco Use   Smoking status: Former    Packs/day: 1.00    Years: 40.00    Total pack years: 40.00    Types: Cigarettes    Quit date: 04/01/2022    Years since quitting: 0.3   Smokeless tobacco:  Never  Vaping Use   Vaping Use: Every day   Devices: zero nicotine vape  Substance and Sexual Activity   Alcohol use: Yes    Alcohol/week: 6.0 standard drinks of alcohol    Types: 6 Glasses of wine per week    Comment: social   Drug use: No   Sexual activity: Not Currently    Birth control/protection: Post-menopausal, Surgical    Comment: BTL  Other Topics Concern   Not on file  Social History Narrative   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  She used to work at [employer].     Has 2 daughters.   Quit smoking 04/01/22.   Drinks 4 to 6 cups of coffee a day.      Currently trying Intermittent Fasting diet for weight loss.  Does not routinely exercise.   Social Determinants of Health   Financial Resource Strain: Low Risk  (02/21/2022)   Overall Financial Resource Strain (CARDIA)    Difficulty of Paying Living Expenses: Not hard at all  Food Insecurity: No Food Insecurity (02/21/2022)   Hunger Vital Sign    Worried About Running Out of Food in the Last Year: Never true    Ran Out of Food in the Last Year: Never true  Transportation Needs: No Transportation Needs (02/21/2022)   PRAPARE - Hydrologist (Medical):  No    Lack of Transportation (Non-Medical): No  Physical Activity: Not on file  Stress: Not on file  Social Connections: Not on file     Family History: The patient's family history includes Breast cancer in her niece and paternal aunt; Cancer - Other in her father; Healthy in her brother, brother, and sister; Hyperlipidemia in her father; Hypertension in her father and sister; Liver cancer in her maternal grandfather; Lung cancer in her brother and maternal grandmother; Parkinson's disease in her father; Prostate cancer in her father; Skin cancer in her maternal aunt; Transient ischemic attack (age of onset: 13) in her mother.  ROS:   Please see the history of present illness.     EKGs/Labs/Other Studies Reviewed:    EKG:  The ekg ordered today demonstrates normal sinus rhythm, heart rate 73.    Recent Labs: 08/01/2022: ALT 10; BUN 10; Creatinine 0.57; Hemoglobin 12.0; Platelet Count 228; Potassium 3.9; Sodium 136   Recent Lipid Panel Lab Results  Component Value Date/Time   CHOL 169 08/03/2020 08:32 AM   TRIG 56 08/03/2020 08:32 AM   HDL 68 08/03/2020 08:32 AM   LDLCALC 90 08/03/2020 08:32 AM    Physical Exam:    VS:  BP 122/80 (BP Location: Left Arm, Patient Position: Sitting, Cuff Size: Normal)   Pulse 73   Ht '5\' 9"'$  (1.753 m)   Wt 155 lb (70.3 kg)   BMI 22.89 kg/m    No data found.       Wt Readings from Last 3 Encounters:  08/09/22 155 lb (70.3 kg)  08/01/22 159 lb 3.2 oz (72.2 kg)  07/26/22 160 lb (72.6 kg)     GEN:  Well nourished, well developed in no acute distress HEENT: Normal NECK: No JVD; No carotid bruits CARDIAC: RRR, no murmurs, rubs, gallops RESPIRATORY:  Clear to auscultation without rales, wheezing or rhonchi  ABDOMEN: Soft, non-tender, non-distended MUSCULOSKELETAL: No edema  SKIN: Warm and dry NEUROLOGIC:  Alert and oriented PSYCHIATRIC:  Normal affect  Has chest port &  drain   ASSESSMENT AND PLAN   Coronary artery disease, no  angina -Two-vessel CAD (LAD, Lcx) noted on coronary calcium score -Continue blood pressure control, lipid therapy and aspirin 81 mg daily.  Hypertension, well controlled today -Blood pressure today 122/80.  Recent high pressures at other visits.  Asked to keep home blood pressure log and gave instructions how to take blood pressure properly.  Recommend she bring this log to all provider appointments. -Refilled amlodipine 10 mg daily. -If blood pressure consistently over 140/90, can consider adding Bystolic (or Coreg).  -Prior hyponatremia with chlorthalidone.  States did ok with HCTZ in the past. -K 3.9 and Cr 0.57 on 08/01/22. -Has allergy to ACE inhibitors - hyperkalemia -Goal BP is <130/80.  Recommend DASH diet (high in vegetables, fruits, low-fat dairy products, whole grains, poultry, fish, and nuts and low in sweets, sugar-sweetened beverages, and red meats), salt restriction and increase physical activity.  Hyperlipidemia with goal LDL <70 -Lipids in May 2023 total 214, HDL 78, triglycerides 96 and LDL 119. -She has been unable to titrate Crestor more in the past secondary to myalgias at higher doses (10 mg daily). -We will refer to Pharm.D. to consider Praluent/Repatha addition. -Discussed cholesterol lowering diets - Mediterranean diet, DASH diet, vegetarian diet, low-carbohydrate diet and avoidance of trans fats.  Discussed healthier choice substitutes.  Nuts, high-fiber foods, and fiber supplements may also improve lipids.       Disposition - Follow-up in 1 year Dr. Ellyn Hack.   Medication Adjustments/Labs and Tests Ordered: Current medicines are reviewed at length with the patient today.  Concerns regarding medicines are outlined above.  Orders Placed This Encounter  Procedures   AMB Referral to [facility]   EKG 12-Lead   Meds ordered this encounter  Medications   amLODipine (NORVASC) 10 MG tablet    Sig: Take 1 tablet (10 mg total) by mouth at bedtime.     Dispense:  90 tablet    Refill:  3    Patient Instructions  Medication Instructions:  Your physician recommends that you continue on your current medications as directed. Please refer to the Current Medication list given to you today.  *If you need a refill on your cardiac medications before your next appointment, please call your pharmacy*  Follow-Up: At [facility], you and your health needs are our priority.  As part of our continuing mission to provide you with exceptional heart care, we have created designated Provider Care Teams.  These Care Teams include your primary Cardiologist (physician) and Advanced Practice Providers (APPs -  Physician Assistants and Nurse Practitioners) who all work together to provide you with the care you need, when you need it.  We recommend signing up for the patient portal called "MyChart".  Sign up information is provided on this After Visit Summary.  MyChart is used to connect with patients for Virtual Visits (Telemedicine).  Patients are able to view lab/test results, encounter notes, upcoming appointments, etc.  Non-urgent messages can be sent to your provider as well.   To learn more about what you can do with MyChart, go to [email or web address].    Your next appointment:   You have been referred to: lipid clinic with pharmacist   12 month(s) with Dr. Ellyn Hack    Other Instructions Keep a journal of your daily blood pressure and heart rate readings .  It is best to check your BP 1-2 hours after taking your medications to see the medications effectiveness on your BP.  Here are some tips that our clinical pharmacists share for home BP monitoring:          Rest 10 minutes before taking your blood pressure.          Don't smoke or drink caffeinated beverages for at least 30 minutes before.          Take your blood pressure before (not after) you eat.          Sit comfortably with your back supported and both feet on the floor  (don't cross your legs).          Elevate your arm to heart level on a table or a desk.          Use the proper sized cuff. It should fit smoothly and snugly around your bare upper arm. There should be enough room to slip a fingertip under the cuff. The bottom edge of the cuff should be 1 inch above the crease of the elbow.            Signed, Warren Lacy, PA-C  08/09/2022 10:30 AM    [facility]

## 2022-08-08 ENCOUNTER — Encounter (HOSPITAL_COMMUNITY): Payer: Self-pay

## 2022-08-08 ENCOUNTER — Ambulatory Visit: Payer: PPO | Admitting: Physician Assistant

## 2022-08-09 ENCOUNTER — Other Ambulatory Visit (HOSPITAL_COMMUNITY): Payer: Self-pay

## 2022-08-09 ENCOUNTER — Encounter: Payer: Self-pay | Admitting: Physician Assistant

## 2022-08-09 ENCOUNTER — Ambulatory Visit: Payer: PPO | Attending: Cardiology | Admitting: Physician Assistant

## 2022-08-09 VITALS — BP 122/80 | HR 73 | Ht 69.0 in | Wt 155.0 lb

## 2022-08-09 DIAGNOSIS — R931 Abnormal findings on diagnostic imaging of heart and coronary circulation: Secondary | ICD-10-CM | POA: Diagnosis not present

## 2022-08-09 DIAGNOSIS — F17200 Nicotine dependence, unspecified, uncomplicated: Secondary | ICD-10-CM | POA: Diagnosis not present

## 2022-08-09 DIAGNOSIS — E785 Hyperlipidemia, unspecified: Secondary | ICD-10-CM | POA: Diagnosis not present

## 2022-08-09 DIAGNOSIS — I10 Essential (primary) hypertension: Secondary | ICD-10-CM | POA: Diagnosis not present

## 2022-08-09 MED ORDER — AMLODIPINE BESYLATE 10 MG PO TABS
10.0000 mg | ORAL_TABLET | Freq: Every day | ORAL | 3 refills | Status: DC
  Filled 2022-08-09 – 2022-08-19 (×2): qty 90, 90d supply, fill #0
  Filled 2022-11-29: qty 90, 90d supply, fill #1
  Filled 2023-02-26: qty 90, 90d supply, fill #2
  Filled 2023-05-24: qty 90, 90d supply, fill #3

## 2022-08-09 NOTE — Patient Instructions (Addendum)
Medication Instructions:  Your physician recommends that you continue on your current medications as directed. Please refer to the Current Medication list given to you today.  *If you need a refill on your cardiac medications before your next appointment, please call your pharmacy*  Follow-Up: At [facility], you and your health needs are our priority.  As part of our continuing mission to provide you with exceptional heart care, we have created designated Provider Care Teams.  These Care Teams include your primary Cardiologist (physician) and Advanced Practice Providers (APPs -  Physician Assistants and Nurse Practitioners) who all work together to provide you with the care you need, when you need it.  We recommend signing up for the patient portal called "MyChart".  Sign up information is provided on this After Visit Summary.  MyChart is used to connect with patients for Virtual Visits (Telemedicine).  Patients are able to view lab/test results, encounter notes, upcoming appointments, etc.  Non-urgent messages can be sent to your provider as well.   To learn more about what you can do with MyChart, go to [email or web address].    Your next appointment:   You have been referred to: lipid clinic with pharmacist   12 month(s) with Dr. Ellyn Hack    Other Instructions Keep a journal of your daily blood pressure and heart rate readings .  It is best to check your BP 1-2 hours after taking your medications to see the medications effectiveness on your BP.    Here are some tips that our clinical pharmacists share for home BP monitoring:          Rest 10 minutes before taking your blood pressure.          Don't smoke or drink caffeinated beverages for at least 30 minutes before.          Take your blood pressure before (not after) you eat.          Sit comfortably with your back supported and both feet on the floor (don't cross your legs).          Elevate your arm to heart level  on a table or a desk.          Use the proper sized cuff. It should fit smoothly and snugly around your bare upper arm. There should be enough room to slip a fingertip under the cuff. The bottom edge of the cuff should be 1 inch above the crease of the elbow.

## 2022-08-10 ENCOUNTER — Ambulatory Visit: Payer: PPO | Attending: Cardiovascular Disease | Admitting: Pharmacist

## 2022-08-10 ENCOUNTER — Encounter: Payer: Self-pay | Admitting: Pharmacist

## 2022-08-10 ENCOUNTER — Telehealth: Payer: Self-pay | Admitting: *Deleted

## 2022-08-10 DIAGNOSIS — E7800 Pure hypercholesterolemia, unspecified: Secondary | ICD-10-CM

## 2022-08-10 DIAGNOSIS — E785 Hyperlipidemia, unspecified: Secondary | ICD-10-CM

## 2022-08-10 DIAGNOSIS — I700 Atherosclerosis of aorta: Secondary | ICD-10-CM | POA: Diagnosis not present

## 2022-08-10 DIAGNOSIS — I6529 Occlusion and stenosis of unspecified carotid artery: Secondary | ICD-10-CM

## 2022-08-10 NOTE — Patient Instructions (Signed)
It was nice meeting you today  We would like your LDL (bad cholesterol) to be less than 70  Please continue your Crestor regimen  We would like to add a new medication called Repatha, which you would inject once every 2 weeks  Please let me know if your oncologist is ok with this  If so, I will complete the prior authorization and contact you when it is approved  Please bring in your proof of income and I will process your application  Once you start the medication, we would recheck your lipid panel in about 2-3 months  Karren Cobble, PharmD, [place], [place], [place], [place], [place], [address] Phone: [phone], Fax: [phone]

## 2022-08-10 NOTE — Telephone Encounter (Signed)
[id number]; Left VM for patient to follow up on the clinical trial as planned. Asked if patient has any questions and if she is interested in participating. If patient would like to participate we will need to get appointment scheduled to see Dr. Burr Medico and research nurse sometime next week.  Left my phone number and asked patient to call back when she has time.  Foye Spurling, BSN, RN, CCRP Clinical Research Nurse II 08/10/2022 9:54 AM

## 2022-08-10 NOTE — Progress Notes (Signed)
Patient ID: Ashley Robertson                 DOB: 06-30-1956                    MRN: [id number]      HPI: Ashley Robertson is a 66 y.o. female patient referred to lipid clinic by Caron Presume. PMH is significant for CAD, HTN, elevated coronary calcium score and breast cancer currently undergoing chemotherapy.  Patient presents today in good spirits.  Currently takes rosuvastatin '5mg'$  alternating with '10mg'$ . If she takes '10mg'$  daily she has myalgias.    Starting a new chemo regimen next month and will possibly be joining a clinical trial.    Has already reached the donut hole with her prescription medications.  Current Medications:  Rosuvastatin '5mg'$  alternating with '10mg'$   Intolerances:  Crestor '10mg'$  daily Atorvastatin  Risk Factors:  CAD Elevated coronary calcium score Smoking history  LDL goal: <70  Labs: TC 214, LDL 119, Trigs 96, HDL 78 (04/10/22 on low dose Crestor)  Coronary calcium score of 79. This was 36 th percentile for age and sex matched control  Past Medical History:  Diagnosis Date   Allergy    Lisinopril and lipitor   Anxiety and depression    Aortic atherosclerosis ([place]) 03/2020   CT Chest: 2 V (LAD & LCx) Coronary Atherosclerosis, Aortic Atherosclerosis (no aneurysm).  Mild centrilobular emphysema with mild diffuse bronchial thickening; several scattered small solitary pulmonary nodules (largest 5.6 mm in anterior left upper lobe)   Breast cancer ([facility]) 12/2021   Right breast ILC   Carotid artery plaque, bilateral 10/2015   Mild to moderate plaque L>R without significant stenosis   COPD (chronic obstructive pulmonary disease) (HCC)    Coronary Artery Calcification - Score 79    Coronary Calcium Score 79.  LAD and circumflex calcification noted.  Normal ascending aorta with mild calcification.   Current every day smoker    pt quit smoking April 2023   Emphysema lung [facility])    Noted on chest CT   Family history of breast cancer 02/21/2022   Family history of  prostate cancer 02/21/2022   Hyperlipidemia    Hypertension    Controlled with amlodipine   Prediabetes    Skin cancer 2021   Remove 2022    Current Outpatient Medications on File Prior to Visit  Medication Sig Dispense Refill   albuterol (PROVENTIL HFA) 108 (90 Base) MCG/ACT inhaler Inhale 1 to 2 puffs into the lungs every 6 hours as needed 8.5 g 2   amLODipine (NORVASC) 10 MG tablet Take 1 tablet (10 mg total) by mouth at bedtime. 90 tablet 3   aspirin 81 MG tablet Take 81 mg by mouth at bedtime.     Calcium Carb-Cholecalciferol (CALCIUM 500 + D3 PO) Take 1 tablet by mouth daily.     Cholecalciferol (VITAMIN D) 50 MCG (2000 UT) CAPS Take 2,000 Units by mouth daily.     FLUoxetine (PROZAC) 20 MG capsule Take 1 capsule by mouth daily. 90 capsule 1   Fluticasone-Umeclidin-Vilant (TRELEGY ELLIPTA) 200-62.5-25 MCG/ACT AEPB Inhale 1 puff into the lungs daily 60 each 2   OVER THE COUNTER MEDICATION Take 1 capsule by mouth daily. Serrapeptase 120,000 units daily  Reported on 12/27/2015     rosuvastatin (CRESTOR) 5 MG tablet Take 1 tablet by mouth daily at bedtime ,except on Monday, Wednesday and Fridays take 2 tablets at bedtime as directed 180 tablet 3  Specialty Vitamins Products (MAGNESIUM, AMINO ACID CHELATE,) 133 MG tablet Take 1 tablet by mouth at bedtime. PURE magnesium supplement - 120 mg of mag per capsule     [DISCONTINUED] hydrochlorothiazide (HYDRODIURIL) 25 MG tablet Take 1 tablet (25 mg total) by mouth daily. 30 tablet 6   [DISCONTINUED] lisinopril (ZESTRIL) 5 MG tablet Take 1 tablet (5 mg total) by mouth daily. 30 tablet 1   No current facility-administered medications on file prior to visit.    Allergies  Allergen Reactions   Lisinopril Other (See Comments) and Cough    High potassium   Lipitor [Atorvastatin] Other (See Comments)    Myalgias    Assessment/Plan:  1. Hyperlipidemia - Patient LDL 119 which is above goal of <70.  Needs further lipid lowering due to elevated  CAC.  Unfortunately can not tolerate other statins other than low dose Crestor. Recommend starting PCSK9i.  Using [employer], educated patient on mechanism of action, storage, site selection, administration, and possible adverse effects. Patient was able to demonstrate use in room. Not likely to have any interactions with current breast cancer treatment however patient will check with oncology to see if she should start medication.   Patient reports she is already in donut hole. Printed patient assistance paperwork and had her sign/ Will submit to company when patient brings in income information. Also printed patient assistance paperwork for her [employer] as a courtesy. Patient appreciative.  If oncology is agreeable with starting medication, will complete PA and contact patient when approved. Recheck lipid panel in 2-3 months.  Karren Cobble, PharmD, BCACP, [place], [place], [place], [place], [address] Phone: [phone], Fax: [phone]

## 2022-08-10 NOTE — Telephone Encounter (Signed)
Patient returned call to research nurse. She states she did not receive email last week with the study information so she has not reviewed the information.  Patient states she does want to participate in the study and willing to meet with Dr. Burr Medico and research nurse preferably on 08/23/22 same day she has other appointments with Radiation Oncology.  Informed patient I will check with Dr. Burr Medico to see if she can see patient that day.  Offered a research appointment sooner to review consent sometime next week.  Patient would rather have her appointments on same day if possible.  Emailed patient another copy of the consent form and encouraged patient to read it. Encouraged patient to contact research nurse by phone or email with any questions. Research nurse will let patient know when her appointment with Burr Medico has been scheduled.  Patient verbalized understanding.  Thanked patient for her time and messaged Dr. Burr Medico regarding above.  Foye Spurling, BSN, RN, [place] Clinical Research Nurse II 08/10/2022 12:51 PM

## 2022-08-13 ENCOUNTER — Telehealth: Payer: Self-pay | Admitting: *Deleted

## 2022-08-13 DIAGNOSIS — M25532 Pain in left wrist: Secondary | ICD-10-CM | POA: Diagnosis not present

## 2022-08-13 DIAGNOSIS — M6281 Muscle weakness (generalized): Secondary | ICD-10-CM | POA: Diagnosis not present

## 2022-08-13 DIAGNOSIS — S52592S Other fractures of lower end of left radius, sequela: Secondary | ICD-10-CM | POA: Diagnosis not present

## 2022-08-13 DIAGNOSIS — M25632 Stiffness of left wrist, not elsewhere classified: Secondary | ICD-10-CM | POA: Diagnosis not present

## 2022-08-13 NOTE — Telephone Encounter (Signed)
[facility] Clinical Trial: Called patient to see if she received email with study information and if she has any questions. Informed her of appointment made to see Dr. Burr Medico on 08/23/22 at 12:40 pm.  Asked if patient can come in earlier at 12 noon to meet with research nurse to review the consent form.  Asked patient to return call to discuss further and left my phone number.   Foye Spurling, BSN, RN, [employer] Research Nurse II 08/13/2022 2:04 PM

## 2022-08-14 ENCOUNTER — Encounter: Payer: Self-pay | Admitting: Hematology

## 2022-08-14 ENCOUNTER — Telehealth: Payer: Self-pay | Admitting: *Deleted

## 2022-08-14 NOTE — Telephone Encounter (Signed)
Patient returned call to research nurse. Informed her of appointment with Dr. Burr Medico added on 9/21 at 12:40 pm and Research nurse at 12:00 pm.  Patient confirmed she will meet with research nurse to review consents while waiting to see Dr. Burr Medico.  Patient understands that treatment will not start for another week, maybe 2 after this visit.  Patient states she still did not receive email with the consent forms.  Patient was kind enough to drive to the [place] to pick them up from this research nurse.  Attached my business card and the card of Vickii Penna, research nurse as back up since this nurse will be out of the office 9/20 through 9/29 and Denny Peon will cover. Encouraged patient to call with any questions before next appointment. She verbalized understanding.  Foye Spurling, BSN, RN, [employer] Clinical Research Nurse II 08/14/2022 12:06 PM

## 2022-08-15 ENCOUNTER — Ambulatory Visit (INDEPENDENT_AMBULATORY_CARE_PROVIDER_SITE_OTHER): Payer: PPO | Admitting: Vascular Surgery

## 2022-08-15 ENCOUNTER — Encounter: Payer: Self-pay | Admitting: Vascular Surgery

## 2022-08-15 VITALS — BP 174/92 | HR 56 | Temp 97.2°F | Ht 69.0 in | Wt 153.0 lb

## 2022-08-15 DIAGNOSIS — I7143 Infrarenal abdominal aortic aneurysm, without rupture: Secondary | ICD-10-CM

## 2022-08-15 NOTE — Progress Notes (Signed)
[facility] [place]  Patient name: Ashley Robertson MRN: [id number] DOB: 1956-06-22 Sex: female  REASON FOR CONSULT: Discuss diagnosis of abdominal aortic aneurysm  HPI: Ashley Robertson is a 66 y.o. female, who is here today for discussion of diagnosis of infrarenal abdominal aortic aneurysm.  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  She had recent diagnosis of right breast cancer.  Has undergone treatment to include recent right mastectomy and axillary dissection.  As part of her work-up, she underwent CT scanning of her chest abdomen and pelvis in April of this year.  This revealed a 5.4 cm infrarenal abdominal aortic aneurysm.  She had no prior knowledge of this.  She had no family history of aneurysm.  Past Medical History:  Diagnosis Date   Allergy    Lisinopril and lipitor   Anxiety and depression    Aortic atherosclerosis ([place]) 03/2020   CT Chest: 2 V (LAD & LCx) Coronary Atherosclerosis, Aortic Atherosclerosis (no aneurysm).  Mild centrilobular emphysema with mild diffuse bronchial thickening; several scattered small solitary pulmonary nodules (largest 5.6 mm in anterior left upper lobe)   Breast cancer ([place]) 12/2021   Right breast ILC   Carotid artery plaque, bilateral 10/2015   Mild to moderate plaque L>R without significant stenosis   COPD (chronic obstructive pulmonary disease) (HCC)    Coronary Artery Calcification - Score 79    Coronary Calcium Score 79.  LAD and circumflex calcification noted.  Normal ascending aorta with mild calcification.   Current every day smoker    pt quit smoking April 2023   Emphysema lung [facility])    Noted on chest CT   Family history of breast cancer 02/21/2022   Family history of prostate cancer 02/21/2022   Hyperlipidemia    Hypertension    Controlled with amlodipine   Prediabetes    Skin cancer 2021    Remove 2022    Family History  Problem Relation Age of Onset   Transient ischemic attack Mother 54   Parkinson's disease Father    Hyperlipidemia Father    Hypertension Father    Cancer - Other Father        sarcoma--left shoulder   Prostate cancer Father        dx after 63   Healthy Sister    Hypertension Sister    Lung cancer Brother        dx 61s   Healthy Brother    Healthy Brother    Skin cancer Maternal Aunt    Breast cancer Paternal Aunt        dx after 65   Lung cancer Maternal Grandmother        dx 78s   Liver cancer Maternal Grandfather        dx 71s   Breast cancer Niece        dx 49s    SOCIAL HISTORY: Social History   Socioeconomic History   Marital status: Married    Spouse name: Not on file   Number of children: 2   Years of education: Not on file   Highest education level: Bachelor's degree (e.g., BA, AB, BS)  Occupational History   Occupation: OR Optician, dispensing: [employer]    Comment: Retired  Tobacco Use   Smoking status: Former    Packs/day: 1.00    Years: 40.00    Total pack  years: 40.00    Types: Cigarettes    Quit date: 04/01/2022    Years since quitting: 0.3   Smokeless tobacco: Never  Vaping Use   Vaping Use: Some days   Devices: zero nicotine vape  Substance and Sexual Activity   Alcohol use: Yes    Alcohol/week: 6.0 standard drinks of alcohol    Types: 6 Glasses of wine per week    Comment: social   Drug use: No   Sexual activity: Not Currently    Birth control/protection: Post-menopausal, Surgical    Comment: BTL  Other Topics Concern   Not on file  Social History Narrative   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  She used to work at [employer].     Has 2 daughters.   Quit smoking 04/01/22.   Drinks 4 to 6 cups of coffee a day.      Currently trying Intermittent Fasting diet for weight loss.  Does not routinely exercise.   Social Determinants  of Health   Financial Resource Strain: Low Risk  (02/21/2022)   Overall Financial Resource Strain (CARDIA)    Difficulty of Paying Living Expenses: Not hard at all  Food Insecurity: No Food Insecurity (02/21/2022)   Hunger Vital Sign    Worried About Running Out of Food in the Last Year: Never true    Ran Out of Food in the Last Year: Never true  Transportation Needs: No Transportation Needs (02/21/2022)   PRAPARE - Hydrologist (Medical): No    Lack of Transportation (Non-Medical): No  Physical Activity: Not on file  Stress: Not on file  Social Connections: Not on file  Intimate Partner Violence: Not on file    Allergies  Allergen Reactions   Lisinopril Other (See Comments) and Cough    High potassium   Lipitor [Atorvastatin] Other (See Comments)    Myalgias    Current Outpatient Medications  Medication Sig Dispense Refill   albuterol (PROVENTIL HFA) 108 (90 Base) MCG/ACT inhaler Inhale 1 to 2 puffs into the lungs every 6 hours as needed 8.5 g 2   amLODipine (NORVASC) 10 MG tablet Take 1 tablet (10 mg total) by mouth at bedtime. 90 tablet 3   aspirin 81 MG tablet Take 81 mg by mouth at bedtime.     Calcium Carb-Cholecalciferol (CALCIUM 500 + D3 PO) Take 1 tablet by mouth daily.     Cholecalciferol (VITAMIN D) 50 MCG (2000 UT) CAPS Take 2,000 Units by mouth daily.     FLUoxetine (PROZAC) 20 MG capsule Take 1 capsule by mouth daily. 90 capsule 1   Fluticasone-Umeclidin-Vilant (TRELEGY ELLIPTA) 200-62.5-25 MCG/ACT AEPB Inhale 1 puff into the lungs daily 60 each 2   OVER THE COUNTER MEDICATION Take 1 capsule by mouth daily. Serrapeptase 120,000 units daily  Reported on 12/27/2015     rosuvastatin (CRESTOR) 5 MG tablet Take 1 tablet by mouth daily at bedtime ,except on Monday, Wednesday and Fridays take 2 tablets at bedtime as directed 180 tablet 3   Specialty Vitamins Products (MAGNESIUM, AMINO ACID CHELATE,) 133 MG tablet Take 1 tablet by mouth at bedtime.  PURE magnesium supplement - 120 mg of mag per capsule     No current facility-administered medications for this visit.    REVIEW OF SYSTEMS:  '[X]'$  denotes positive finding, '[ ]'$  denotes negative finding Cardiac  Comments:  Chest pain or chest pressure:  Shortness of breath upon exertion:    Short of breath when lying flat:    Irregular heart rhythm:        Vascular    Pain in calf, thigh, or hip brought on by ambulation:    Pain in feet at night that wakes you up from your sleep:     Blood clot in your veins:    Leg swelling:         Pulmonary    Oxygen at home:    Productive cough:     Wheezing:         Neurologic    Sudden weakness in arms or legs:     Sudden numbness in arms or legs:     Sudden onset of difficulty speaking or slurred speech:    Temporary loss of vision in one eye:     Problems with dizziness:         Gastrointestinal    Blood in stool:     Vomited blood:         Genitourinary    Burning when urinating:     Blood in urine:        Psychiatric    Major depression:         Hematologic    Bleeding problems:    Problems with blood clotting too easily:        Skin    Rashes or ulcers:        Constitutional    Fever or chills:      PHYSICAL EXAM: Vitals:   08/15/22 0950  BP: (!) 174/92  Pulse: (!) 56  Temp: (!) 97.2 F (36.2 C)  SpO2: 93%  Weight: 153 lb (69.4 kg)  Height: '5\' 9"'$  (1.753 m)    GENERAL: The patient is a well-nourished female, in no acute distress. The vital signs are documented above. CARDIOVASCULAR: 2+ dorsalis pedis pulses bilaterally.  2+ popliteal pulses without evidence of peripheral aneurysm.  Normal femoral artery pulse.  She has a easily palpable nontender abdominal aortic aneurysm. PULMONARY: There is good air exchange  MUSCULOSKELETAL: There are no major deformities or cyanosis. NEUROLOGIC: No focal weakness or paresthesias are detected. SKIN: There are no ulcers or rashes noted. PSYCHIATRIC: The patient has a  normal affect.  DATA:  I reviewed her films from her CT scan with her in the office today.  She does have an infrarenal aneurysm that ends above the level of the iliac bifurcation.  She does have some calcification but no aneurysmal change in her iliac vessels.  Her right renal artery is the highest takeoff.  Her left renal artery has a relatively low takeoff just above the initiation of the aneurysm.  MEDICAL ISSUES: I discussed these findings with the patient.  Explained that typical recommendation for repair in females is 5.0 cm size.  Obviously she is undergoing active treatment for her breast cancer with chemotherapy and radiation planned.  We would defer any further evaluation and treatment of her aneurysm until this is complete.  I did discuss open standard aorta aneurysm repair and also stent graft repair.  She does have a low left renal artery takeoff and she may her not a candidate for standard stent graft repair.  She may be a candidate for fenestrated graft and I discussed this with her as well.  I explained that her aneurysm puts her at approximately 5 %/year risk for rupture.  I did discuss symptoms of aneurysm and that she needs to report immediately  to the Emergency Room Should This Occur by Calling 911.  We will schedule appointment with her with Dr. [name] in [place] office in 6 months.  We will get a formal CT angiogram of her abdomen and pelvis at that time.   Rosetta Posner, MD FACS [facility] [phone] Pager [phone]  Note: Portions of this report may have been transcribed using voice recognition software.  Every effort has been made to ensure accuracy; however, inadvertent computerized transcription errors may still be present.

## 2022-08-17 ENCOUNTER — Other Ambulatory Visit (HOSPITAL_COMMUNITY): Payer: Self-pay

## 2022-08-17 DIAGNOSIS — S52592S Other fractures of lower end of left radius, sequela: Secondary | ICD-10-CM | POA: Diagnosis not present

## 2022-08-17 DIAGNOSIS — M6281 Muscle weakness (generalized): Secondary | ICD-10-CM | POA: Diagnosis not present

## 2022-08-17 DIAGNOSIS — M25632 Stiffness of left wrist, not elsewhere classified: Secondary | ICD-10-CM | POA: Diagnosis not present

## 2022-08-17 DIAGNOSIS — M25532 Pain in left wrist: Secondary | ICD-10-CM | POA: Diagnosis not present

## 2022-08-19 ENCOUNTER — Other Ambulatory Visit: Payer: Self-pay | Admitting: Cardiology

## 2022-08-19 ENCOUNTER — Other Ambulatory Visit (HOSPITAL_COMMUNITY): Payer: Self-pay

## 2022-08-20 ENCOUNTER — Other Ambulatory Visit (HOSPITAL_COMMUNITY): Payer: Self-pay

## 2022-08-20 DIAGNOSIS — S52592S Other fractures of lower end of left radius, sequela: Secondary | ICD-10-CM | POA: Diagnosis not present

## 2022-08-20 DIAGNOSIS — M25532 Pain in left wrist: Secondary | ICD-10-CM | POA: Diagnosis not present

## 2022-08-20 DIAGNOSIS — M6281 Muscle weakness (generalized): Secondary | ICD-10-CM | POA: Diagnosis not present

## 2022-08-20 DIAGNOSIS — M25632 Stiffness of left wrist, not elsewhere classified: Secondary | ICD-10-CM | POA: Diagnosis not present

## 2022-08-20 MED ORDER — ROSUVASTATIN CALCIUM 5 MG PO TABS
ORAL_TABLET | ORAL | 3 refills | Status: DC
  Filled 2022-08-20: qty 128, 90d supply, fill #0
  Filled 2022-11-29: qty 128, 90d supply, fill #1
  Filled 2023-02-26: qty 128, 90d supply, fill #2
  Filled 2023-05-24: qty 128, 90d supply, fill #3
  Filled 2023-08-18: qty 128, 90d supply, fill #4

## 2022-08-20 NOTE — Progress Notes (Signed)
Radiation Oncology         [phone] ________________________________  Name: Ashley Robertson        MRN: [id number]  Date of Service: 08/23/2022 DOB: 1956-04-29  JG:GEZM, [name], MD  Truitt Merle, MD     REFERRING PHYSICIAN: Truitt Merle, MD   DIAGNOSIS: The encounter diagnosis was Malignant neoplasm of overlapping sites of right breast in female, estrogen receptor positive ([place]).   HISTORY OF PRESENT ILLNESS: Ashley Robertson is a 66 y.o. female originally seen in the multidisciplinary breast clinic for a new diagnosis of right breast cancer.  The patient was found to have a palpable mass in the right breast.  By diagnostic imaging in the 11 o'clock position a 3.3 cm mass was seen, and the 6 o'clock position also of the right breast there was a 2.6 cm mass.  She has 1 abnormal appearing axillary lymph node.  She underwent biopsies showing a grade 2 invasive lobular carcinoma that was ER positive PR negative, HER2 amplified with a Ki-67 of 10%.  Her lymph node was also biopsied positive with the same prognostics.    She proceeded with neoadjuvant chemotherapy beginning 03/07/2022 including targeted anti-HER2 therapy.  Her last dose of chemotherapy was on 06/20/2022 and she has continued with Perjeta/Ogivri.  She then underwent right mastectomy with sentinel lymph node biopsy on 07/26/2022.  Final pathology showed a 9.5 cm invasive lobular carcinoma involving the dermis of the nipple.  All margins were negative.  An intramammary lymph node was negative.  Her sentinel axillary nodes were sampled and 2 out of 4 sentinel nodes were positive.  She is seen to discuss adjuvant radiotherapy. She is still in process of her expansion with Dr. Iran Planas.    PREVIOUS RADIATION THERAPY: No   PAST MEDICAL HISTORY:  Past Medical History:  Diagnosis Date   Allergy    Lisinopril and lipitor   Anxiety and depression    Aortic atherosclerosis ([place]) 03/2020   CT Chest: 2 V (LAD & LCx) Coronary Atherosclerosis,  Aortic Atherosclerosis (no aneurysm).  Mild centrilobular emphysema with mild diffuse bronchial thickening; several scattered small solitary pulmonary nodules (largest 5.6 mm in anterior left upper lobe)   Breast cancer ([place]) 12/2021   Right breast ILC   Carotid artery plaque, bilateral 10/2015   Mild to moderate plaque L>R without significant stenosis   COPD (chronic obstructive pulmonary disease) (HCC)    Coronary Artery Calcification - Score 79    Coronary Calcium Score 79.  LAD and circumflex calcification noted.  Normal ascending aorta with mild calcification.   Current every day smoker    pt quit smoking April 2023   Emphysema lung [facility] [place])    Noted on chest CT   Family history of breast cancer 02/21/2022   Family history of prostate cancer 02/21/2022   Hyperlipidemia    Hypertension    Controlled with amlodipine   Prediabetes    Skin cancer 2021   Remove 2022       PAST SURGICAL HISTORY: Past Surgical History:  Procedure Laterality Date   BREAST BIOPSY Right    times 3   BREAST RECONSTRUCTION WITH PLACEMENT OF TISSUE EXPANDER AND ALLODERM Right 07/26/2022   Procedure: RIGHT BREAST RECONSTRUCTION WITH PLACEMENT OF TISSUE EXPANDER AND ALLODERM;  Surgeon: Irene Limbo, MD;  Location: [facility];  Service: Plastics;  Laterality: Right;   [place]   Rhinoplasty   MASTECTOMY W/ SENTINEL NODE BIOPSY Right 07/26/2022  Procedure: RIGHT MASTECTOMY, RIGHT AXILLARY SENTINEL NODE BIOPSY;  Surgeon: Rolm Bookbinder, MD;  Location: [facility];  Service: General;  Laterality: Right;  GEN & PEC BLOCK   PORTACATH PLACEMENT Left 03/06/2022   Procedure: INSERTION PORT-A-CATH;  Surgeon: Rolm Bookbinder, MD;  Location: [facility];  Service: General;  Laterality: Left;   RADIOACTIVE SEED GUIDED AXILLARY SENTINEL LYMPH NODE Right 07/26/2022   Procedure: RADIOACTIVE SEED GUIDED AXILLARY SENTINEL LYMPH NODE DISSECTION;  Surgeon: Rolm Bookbinder, MD;  Location: [facility];  Service: General;  Laterality: Right;   [place] ECHOCARDIOGRAM  10/2016   EF 55 to 60%.  Normal systolic and diastolic function.  No ASD/PFO   TUBAL LIGATION  1991     FAMILY HISTORY:  Family History  Problem Relation Age of Onset   Transient ischemic attack Mother 62   Parkinson's disease Father    Hyperlipidemia Father    Hypertension Father    Cancer - Other Father        sarcoma--left shoulder   Prostate cancer Father        dx after 41   Healthy Sister    Hypertension Sister    Lung cancer Brother        dx 73s   Healthy Brother    Healthy Brother    Skin cancer Maternal Aunt    Breast cancer Paternal Aunt        dx after 74   Lung cancer Maternal Grandmother        dx 40s   Liver cancer Maternal Grandfather        dx 43s   Breast cancer Niece        dx 12s     SOCIAL HISTORY:  reports that she quit smoking about 4 months ago. Her smoking use included cigarettes. She has a 40.00 pack-year smoking history. She has never used smokeless tobacco. She reports current alcohol use of about 6.0 standard drinks of alcohol per week. She reports that she does not use drugs. The patient is married and lives in [place]. 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     ALLERGIES: Lisinopril and Lipitor [atorvastatin]   MEDICATIONS:  Current Outpatient Medications  Medication Sig Dispense Refill   albuterol (PROVENTIL HFA) 108 (90 Base) MCG/ACT inhaler Inhale 1 to 2 puffs into the lungs every 6 hours as needed 8.5 g 2   amLODipine (NORVASC) 10 MG tablet Take 1 tablet (10 mg total) by mouth at bedtime. 90 tablet 3   aspirin 81 MG tablet Take 81 mg by mouth at bedtime.     Calcium Carb-Cholecalciferol (CALCIUM 500 + D3 PO) Take 1 tablet by mouth daily.     Cholecalciferol (VITAMIN D) 50 MCG (2000 UT) CAPS Take 2,000 Units by mouth daily.     FLUoxetine  (PROZAC) 20 MG capsule Take 1 capsule by mouth daily. 90 capsule 1   Fluticasone-Umeclidin-Vilant (TRELEGY ELLIPTA) 200-62.5-25 MCG/ACT AEPB Inhale 1 puff into the lungs daily 60 each 2   OVER THE COUNTER MEDICATION Take 1 capsule by mouth daily. Serrapeptase 120,000 units daily  Reported on 12/27/2015     rosuvastatin (CRESTOR) 5 MG tablet Take 1 tablet by mouth daily at bedtime ,except on Monday, Wednesday and Fridays take 2 tablets at bedtime as directed 180 tablet 3   Specialty Vitamins Products (MAGNESIUM, AMINO ACID CHELATE,) 133 MG tablet Take 1 tablet by  mouth at bedtime. PURE magnesium supplement - 120 mg of mag per capsule     No current facility-administered medications for this encounter.     REVIEW OF SYSTEMS: On review of systems, the patient reports that she is doing really well. She feels like her course has gone well. She did have difficulty with some neuropathy and fatigue during radiation.   PHYSICAL EXAM:  Wt Readings from Last 3 Encounters:  08/23/22 160 lb 9.6 oz (72.8 kg)  08/23/22 162 lb 1.6 oz (73.5 kg)  08/15/22 153 lb (69.4 kg)   Temp Readings from Last 3 Encounters:  08/23/22 97.8 F (36.6 C) (Oral)  08/23/22 98.2 F (36.8 C) (Oral)  08/15/22 (!) 97.2 F (36.2 C)   BP Readings from Last 3 Encounters:  08/23/22 (!) 164/76  08/23/22 (!) 171/79  08/15/22 (!) 174/92   Pulse Readings from Last 3 Encounters:  08/23/22 (!) 56  08/23/22 61  08/15/22 (!) 56     In general this is a well appearing caucasian female in no acute distress. She's alert and oriented x4 and appropriate throughout the examination. Cardiopulmonary assessment is negative for acute distress and she exhibits normal effort. Her right chest reveals an in situ tissue expander. The incision is intact, but minimal separation can be seen below the dermabond recently placed.     ECOG = 0  0 - Asymptomatic (Fully active, able to carry on all predisease activities without restriction)  1 -  Symptomatic but completely ambulatory (Restricted in physically strenuous activity but ambulatory and able to carry out work of a light or sedentary nature. For example, light housework, office work)  2 - Symptomatic, <50% in bed during the day (Ambulatory and capable of all self care but unable to carry out any work activities. Up and about more than 50% of waking hours)  3 - Symptomatic, >50% in bed, but not bedbound (Capable of only limited self-care, confined to bed or chair 50% or more of waking hours)  4 - Bedbound (Completely disabled. Cannot carry on any self-care. Totally confined to bed or chair)  5 - Death   Eustace Pen MM, Creech RH, Tormey DC, et al. [phone]). "Toxicity and response criteria of the [facility]". Opal Oncol. 5 (6): 649-55    LABORATORY DATA:  Lab Results  Component Value Date   WBC 4.5 08/23/2022   HGB 12.7 08/23/2022   HCT 38.4 08/23/2022   MCV 97.2 08/23/2022   PLT 280 08/23/2022   Lab Results  Component Value Date   NA 136 08/23/2022   K 4.3 08/23/2022   CL 103 08/23/2022   CO2 27 08/23/2022   Lab Results  Component Value Date   ALT 13 08/23/2022   AST 19 08/23/2022   ALKPHOS 110 08/23/2022   BILITOT 0.4 08/23/2022      RADIOGRAPHY: MM Breast Surgical Specimen  Result Date: 07/26/2022 CLINICAL DATA:  Evaluate surgical specimen following excision of RIGHT axillary lymph node. EXAM: SPECIMEN RADIOGRAPH OF THE RIGHT AXILLA COMPARISON:  Previous exam(s). FINDINGS: Status post excision of the RIGHT axilla. The radioactive seed and biopsy marker clip are present and completely intact. IMPRESSION: Specimen radiograph of the RIGHT axilla. Electronically Signed   By: Margarette Canada M.D.   On: 07/26/2022 10:33  MM CLIP PLACEMENT RIGHT  Result Date: 07/25/2022 CLINICAL DATA:  Mammogram status post radioactive seed localization of a right axillary lymph node. EXAM: DIAGNOSTIC RIGHT MAMMOGRAM POST ULTRASOUND-GUIDED RADIOACTIVE SEED  PLACEMENT COMPARISON:  Previous  exam(s). FINDINGS: Mammographic images were obtained following ultrasound-guided radioactive seed placement. These demonstrate that the radioactive seed is immediately adjacent to the biopsy marking clip within a lymph node in the right axilla. IMPRESSION: Appropriate location of the radioactive seed. Final Assessment: Post Procedure Mammograms for Seed Placement Electronically Signed   By: Ammie Ferrier M.D.   On: 07/25/2022 15:22  [place] RT RADIOACTIVE SEED LOC  Result Date: 07/25/2022 CLINICAL DATA:  66 year old female presenting for radioactive seed localization of a right axillary lymph node. EXAM: ULTRASOUND GUIDED RADIOACTIVE SEED LOCALIZATION OF THE RIGHT AXILLA COMPARISON:  Previous exam(s). FINDINGS: Patient presents for radioactive seed localization prior to targeted right axillary lymph node dissection. I met with the patient and we discussed the procedure of seed localization including benefits and alternatives. We discussed the high likelihood of a successful procedure. We discussed the risks of the procedure including infection, bleeding, tissue injury and further surgery. We discussed the low dose of radioactivity involved in the procedure. Informed, written consent was given. The usual time-out protocol was performed immediately prior to the procedure. Using ultrasound guidance, sterile technique, 1% lidocaine and an I-125 radioactive seed, a right axillary lymph node was localized using an inferior approach. The follow-up mammogram images confirm the seed in the expected location and were marked for Dr. Donne Hazel. Follow-up survey of the patient confirms presence of the radioactive seed. Order number of I-125 seed:  [id number]. Total activity:  0.600 millicuries reference Date: 07/11/2022 The patient tolerated the procedure well and was released from the [facility]. She was given instructions regarding seed removal. IMPRESSION: Radioactive seed localization  right breast. No apparent complications. Electronically Signed   By: Ammie Ferrier M.D.   On: 07/25/2022 15:20      IMPRESSION/PLAN: 1. Stage IIA, cT2N1M0, grade 2 ER positive, HER2 amplified invasive lobular carcinoma of the right breast with residual disease following neoadjuvant chemotherapy. Dr. Lisbeth Renshaw has reviewed her final pathology findings and today we reviewed  the nature of localized right breast disease.  We reviewed the rationale for adjuvant postmastectomy radiotherapy to reduce risks of local recurrence both in the chest wall and regional nodes.  Her reconstruction plan is to delay until 6 months after the conclusion of radiotherapy.  We discussed the risks, benefits, short, and long term effects of radiotherapy, as well as the curative intent, and the patient is interested in proceeding.  I reviewed the delivery and logistics of radiotherapy and reviewed Dr. Ida Rogue recommendation for 6 1/2 weeks of radiotherapy to the chest wall as well as her regional lymph nodes. Written consent is obtained and placed in the chart, a copy was provided to the patient.  We've moved her simulation to 09/04/22 but can move this further out if needed.  In a visit lasting 45 minutes, greater than 50% of the time was spent face to face reviewing her case, as well as in preparation of, discussing, and coordinating the patient's care.      Carola Rhine, [facility]    **Disclaimer: This note was dictated with voice recognition software. Similar sounding words can inadvertently be transcribed and this note may contain transcription errors which may not have been corrected upon publication of note.**

## 2022-08-21 ENCOUNTER — Other Ambulatory Visit (HOSPITAL_COMMUNITY): Payer: Self-pay

## 2022-08-23 ENCOUNTER — Encounter: Payer: Self-pay | Admitting: Hematology

## 2022-08-23 ENCOUNTER — Inpatient Hospital Stay: Payer: PPO

## 2022-08-23 ENCOUNTER — Ambulatory Visit
Admission: RE | Admit: 2022-08-23 | Discharge: 2022-08-23 | Disposition: A | Discharge status: Home / Self Care | Payer: PPO | Source: Ambulatory Visit | Attending: Radiation Oncology | Admitting: Radiation Oncology

## 2022-08-23 ENCOUNTER — Encounter: Payer: Self-pay | Admitting: Radiation Oncology

## 2022-08-23 ENCOUNTER — Ambulatory Visit: Payer: PPO | Admitting: Radiation Oncology

## 2022-08-23 ENCOUNTER — Inpatient Hospital Stay: Payer: PPO | Attending: Hematology | Admitting: Hematology

## 2022-08-23 VITALS — BP 171/79 | HR 61 | Temp 98.2°F | Resp 20 | Ht 69.0 in | Wt 162.1 lb

## 2022-08-23 VITALS — BP 164/76 | HR 56 | Temp 97.8°F | Resp 20 | Ht 69.0 in | Wt 160.6 lb

## 2022-08-23 DIAGNOSIS — Z7982 Long term (current) use of aspirin: Secondary | ICD-10-CM | POA: Insufficient documentation

## 2022-08-23 DIAGNOSIS — F419 Anxiety disorder, unspecified: Secondary | ICD-10-CM | POA: Insufficient documentation

## 2022-08-23 DIAGNOSIS — E785 Hyperlipidemia, unspecified: Secondary | ICD-10-CM | POA: Insufficient documentation

## 2022-08-23 DIAGNOSIS — Z79899 Other long term (current) drug therapy: Secondary | ICD-10-CM | POA: Insufficient documentation

## 2022-08-23 DIAGNOSIS — J432 Centrilobular emphysema: Secondary | ICD-10-CM | POA: Insufficient documentation

## 2022-08-23 DIAGNOSIS — Z801 Family history of malignant neoplasm of trachea, bronchus and lung: Secondary | ICD-10-CM | POA: Insufficient documentation

## 2022-08-23 DIAGNOSIS — Z5112 Encounter for antineoplastic immunotherapy: Secondary | ICD-10-CM | POA: Insufficient documentation

## 2022-08-23 DIAGNOSIS — Z800 Family history of malignant neoplasm of digestive organs: Secondary | ICD-10-CM | POA: Insufficient documentation

## 2022-08-23 DIAGNOSIS — M25532 Pain in left wrist: Secondary | ICD-10-CM | POA: Diagnosis not present

## 2022-08-23 DIAGNOSIS — Z170 Estrogen receptor positive status [ER+]: Secondary | ICD-10-CM

## 2022-08-23 DIAGNOSIS — I2510 Atherosclerotic heart disease of native coronary artery without angina pectoris: Secondary | ICD-10-CM | POA: Insufficient documentation

## 2022-08-23 DIAGNOSIS — Z87891 Personal history of nicotine dependence: Secondary | ICD-10-CM | POA: Insufficient documentation

## 2022-08-23 DIAGNOSIS — Z8042 Family history of malignant neoplasm of prostate: Secondary | ICD-10-CM | POA: Insufficient documentation

## 2022-08-23 DIAGNOSIS — I10 Essential (primary) hypertension: Secondary | ICD-10-CM | POA: Insufficient documentation

## 2022-08-23 DIAGNOSIS — Z803 Family history of malignant neoplasm of breast: Secondary | ICD-10-CM | POA: Insufficient documentation

## 2022-08-23 DIAGNOSIS — J449 Chronic obstructive pulmonary disease, unspecified: Secondary | ICD-10-CM | POA: Insufficient documentation

## 2022-08-23 DIAGNOSIS — I700 Atherosclerosis of aorta: Secondary | ICD-10-CM | POA: Insufficient documentation

## 2022-08-23 DIAGNOSIS — C50811 Malignant neoplasm of overlapping sites of right female breast: Secondary | ICD-10-CM

## 2022-08-23 DIAGNOSIS — C773 Secondary and unspecified malignant neoplasm of axilla and upper limb lymph nodes: Secondary | ICD-10-CM | POA: Insufficient documentation

## 2022-08-23 DIAGNOSIS — S52592S Other fractures of lower end of left radius, sequela: Secondary | ICD-10-CM | POA: Diagnosis not present

## 2022-08-23 DIAGNOSIS — Z9011 Acquired absence of right breast and nipple: Secondary | ICD-10-CM | POA: Insufficient documentation

## 2022-08-23 DIAGNOSIS — Z85828 Personal history of other malignant neoplasm of skin: Secondary | ICD-10-CM | POA: Insufficient documentation

## 2022-08-23 DIAGNOSIS — M8588 Other specified disorders of bone density and structure, other site: Secondary | ICD-10-CM | POA: Diagnosis not present

## 2022-08-23 DIAGNOSIS — M25632 Stiffness of left wrist, not elsewhere classified: Secondary | ICD-10-CM | POA: Diagnosis not present

## 2022-08-23 DIAGNOSIS — M6281 Muscle weakness (generalized): Secondary | ICD-10-CM | POA: Diagnosis not present

## 2022-08-23 LAB — CMP (CANCER CENTER ONLY)
ALT: 13 U/L (ref 0–44)
AST: 19 U/L (ref 15–41)
Albumin: 3.9 g/dL (ref 3.5–5.0)
Alkaline Phosphatase: 110 U/L (ref 38–126)
Anion gap: 6 (ref 5–15)
BUN: 16 mg/dL (ref 8–23)
CO2: 27 mmol/L (ref 22–32)
Calcium: 9 mg/dL (ref 8.9–10.3)
Chloride: 103 mmol/L (ref 98–111)
Creatinine: 0.64 mg/dL (ref 0.44–1.00)
GFR, Estimated: >60 mL/min (ref >60)
Glucose, Bld: 110 mg/dL — ABNORMAL HIGH (ref 70–99)
Potassium: 4.3 mmol/L (ref 3.5–5.1)
Sodium: 136 mmol/L (ref 135–145)
Total Bilirubin: 0.4 mg/dL (ref 0.3–1.2)
Total Protein: 6.8 g/dL (ref 6.5–8.1)

## 2022-08-23 LAB — CBC WITH DIFFERENTIAL (CANCER CENTER ONLY)
Abs Immature Granulocytes: 0.01 10*3/uL (ref 0.00–0.07)
Basophils Absolute: 0 10*3/uL (ref 0.0–0.1)
Basophils Relative: 1 %
Eosinophils Absolute: 0.2 10*3/uL (ref 0.0–0.5)
Eosinophils Relative: 5 %
HCT: 38.4 % (ref 36.0–46.0)
Hemoglobin: 12.7 g/dL (ref 12.0–15.0)
Immature Granulocytes: 0 %
Lymphocytes Relative: 28 %
Lymphs Abs: 1.3 10*3/uL (ref 0.7–4.0)
MCH: 32.2 pg (ref 26.0–34.0)
MCHC: 33.1 g/dL (ref 30.0–36.0)
MCV: 97.2 fL (ref 80.0–100.0)
Monocytes Absolute: 0.5 10*3/uL (ref 0.1–1.0)
Monocytes Relative: 12 %
Neutro Abs: 2.5 10*3/uL (ref 1.7–7.7)
Neutrophils Relative %: 54 %
Platelet Count: 280 10*3/uL (ref 150–400)
RBC: 3.95 MIL/uL (ref 3.87–5.11)
RDW: 12.3 % (ref 11.5–15.5)
WBC Count: 4.5 10*3/uL (ref 4.0–10.5)
nRBC: 0 % (ref 0.0–0.2)

## 2022-08-23 NOTE — Research (Signed)
CompassHER2 Residual Disease, A Double-Blinded, Phase III Randomized Trial of T-DM1 and Placebo Compared with T-DMI and Tucatinib  Met with patient to sign consent at scheduled research visit. Patient stated that she had changed her mind; she was hesitant of the potential side effects of the tucatinib/placebo and no longer wanted to participate. Informed referring MD. Will introduce demographic DCP study in the coming weeks.  Patient thanked for her time and consideration and encouraged to call research with any questions.  Vickii Penna, RN, BSN, CPN Clinical Research Nurse I [phone]  08/23/2022 1:11 PM

## 2022-08-23 NOTE — Progress Notes (Signed)
[facility]   Telephone:([phone]) [phone] Fax:(336) [phone]   Clinic Follow up Note   Patient Care Team: Truitt Merle, MD as PCP - General (Hematology) Leonie Man, MD as PCP - Cardiology (Cardiology) Mauro Kaufmann, RN as Oncology Nurse Navigator Rockwell Germany, RN as Oncology Nurse Navigator Rolm Bookbinder, MD as Consulting Physician (General Surgery) Truitt Merle, MD as Consulting Physician (Hematology) Kyung Rudd, MD as Consulting Physician (Radiation Oncology)  Date of Service:  08/23/2022  CHIEF COMPLAINT: f/u of right breast cancer  CURRENT THERAPY:  Kadcyla, q21d, starting 08/24/22  ASSESSMENT & PLAN:  Ashley Robertson is a 66 y.o. post-menopausal female with   1. Malignant neoplasm of overlapping sites of right breast, invasive lobular carcinoma, Stage IIIA, c(T3, N1), yp(T3, N1a), ER+/PR-/HER2+, Grade 2  -presented with new right breast mass. Biopsy on 02/09/22 revealed invasive lobular carcinoma to both sites and metastatic carcinoma in lymph node. Breast MRI on 03/01/22 showed: 8.5 cm area of confluent mass-like enhancement in right breast involving all 4 quadrants; biopsy-proven metastatic lymph node in right axilla. -staging CT CAP and bone scan on 03/05/22 were negative for metastatic disease. -baseline echo on 03/05/22 showed EF of 55-60%, normal. -she completed neoadjuvant TCHP 03/07/22 - 06/20/22. She tolerated very well with no major side effects.  -s/p right mastectomy by Dr. Donne Hazel and reconstruction by Dr. Iran Planas. Path showed 9.5 cm invasive lobular carcinoma involving dermis of nipple, margins negative, 2/5 positive lymph nodes. -given the significant residual disease, the recommendation is for postmastectomy radiation and switch to adjuvant Kadcyla. We reviewed side effects of Kadcyla, and she again agrees to proceed. She declined the clinical trail COMPASS HER2. -lab reviewed, all WNL. Her incisions are healing well. -she is scheduled to switch  to Kadcyla tomorrow, 9/22, and for CT simulation on 10/3 for adjuvant radiation.   2. Genetics -she has a family history of breast cancer in a paternal aunt and a niece, liver cancer in her maternal grandfather, and prostate cancer in her father. -testing performed on 02/21/22 was negative, with a VUS in [place]   3. Bone Health  -Her most recent DEXA was on 04/29/20 showing osteopenia (T-score of -2.1 at L1-3).   4. COPD and Smoking Cessation -she reports she quit smoking on 03/01/22. She notes she is currently using a non-nicotine vape, which has aided her in quitting.   5. HTN and prediabetes, anxiety -Continue medication, and follow-up with PCP -We will follow-up her blood pressure and blood glucose closely during her chemotherapy     PLAN:  -re-consult with Dr. Lisbeth Renshaw today -start Kadcyla tomorrow, 9/22 and continue every 3 weeks -CT sim 10/3 -lab, flush, f/u, and kadcyla 10/12   No problem-specific Assessment & Plan notes found for this encounter.   SUMMARY OF ONCOLOGIC HISTORY: Oncology History Overview Note   Cancer Staging  Malignant neoplasm of overlapping sites of right breast in female, estrogen receptor positive ([place]) Staging form: Breast, AJCC 8th Edition - Clinical stage from 02/09/2022: Stage IIA (cT2, cN1, cM0, G2, ER+, PR-, HER2+) - Signed by Truitt Merle, MD on 02/20/2022    Malignant neoplasm of overlapping sites of right breast in female, estrogen receptor positive ([place])  02/08/2022 Mammogram   CLINICAL DATA:  Acute onset right breast lump.  EXAM: DIGITAL DIAGNOSTIC UNILATERAL RIGHT MAMMOGRAM WITH TOMOSYNTHESIS AND CAD; ULTRASOUND RIGHT BREAST LIMITED  IMPRESSION: 1. Highly suspicious findings mammographically and sonographically. I suspect there is significant malignancy throughout most of the right breast. Discrete masses are  seen at 11 o'clock and 6 o'clock.  A single borderline lymph node is identified. This lymph node appears prominent compared to the  remainder of the lymph nodes. The skin thickening suggests the possibility of inflammatory breast cancer.   02/09/2022 Cancer Staging   Staging form: Breast, AJCC 8th Edition - Clinical stage from 02/09/2022: Stage IIIA (cT3, cN1, cM0, G2, ER+, PR-, HER2+) - Signed by Truitt Merle, MD on 03/05/2022 Stage prefix: Initial diagnosis Histologic grading system: 3 grade system   02/09/2022 Initial Biopsy   Diagnosis 1. Breast, right, needle core biopsy, right breast 11 o'clock mass ribbon clip - INVASIVE MAMMARY CARCINOMA - SEE COMMENT 2. Breast, right, needle core biopsy, right breast 6'oclock mass, coil clip - INVASIVE MAMMARY CARCINOMA - SEE COMMENT 3. Lymph node, needle/core biopsy, right axillary lymph node, tribell clip - METASTATIC CARCINOMA INVOLVING NODAL TISSUE Microscopic Comment 1. and 2. The biopsy material shows an infiltrative proliferation of cells arranged linearly and in small clusters. Based on the biopsy, the carcinoma appears Nottingham grade 2 of 3 and measures 1.5 cm in greatest linear extent.  Addendum parts 1 and 2: Immunohistochemistry for E-cadherin is negative consistent with lobular carcinoma.  2. PROGNOSTIC INDICATORS Results: The tumor cells are EQUIVOCAL for Her2 (2+). Her2 by FISH will be performed and the results reported separately. Estrogen Receptor: 100%, POSITIVE, STRONG STAINING INTENSITY Progesterone Receptor: <1%, NEGATIVE Proliferation Marker Ki67: 10%  2. FLUORESCENCE IN-SITU HYBRIDIZATION Results: GROUP 1: HER2 **POSITIVE**   3. PROGNOSTIC INDICATORS Results: By immunohistochemistry, the tumor cells are POSITIVE for Her2 (3+). Estrogen Receptor: 100%, POSITIVE, STRONG STAINING INTENSITY Progesterone Receptor: <1%, NEGATIVE   02/16/2022 Initial Diagnosis   Malignant neoplasm of overlapping sites of right breast in female, estrogen receptor positive ([place])   03/01/2022 Imaging   EXAM: BILATERAL BREAST MRI WITH AND WITHOUT  CONTRAST  IMPRESSION: 1. 8.5 x 8.3 x 5.9 cm area of confluent mass-like enhancement in the right breast involving all 4 quadrants and containing 2 biopsy marker clip artifacts, compatible with 4 quadrant biopsy-proven malignancy. 2. Biopsy-proven metastatic lymph node in the right axilla. 3. Possible metastatic intramammary lymph node in the posterior outer right breast just above the level of the nipple. 4. No evidence of malignancy on the left.   03/01/2022 Genetic Testing   Negative hereditary cancer genetic testing: no pathogenic variants detected in [facility] CustomNext-Cancer +RNAinsight Panel.  Variant of uncertain significance reported in BRIP1 at p.F934V (c.2800T>G). Report date is March 01, 2022.    The CustomNext-Cancer+RNAinsight panel offered by [facility] includes sequencing and rearrangement analysis for the following 47 genes:  APC, ATM, AXIN2, BARD1, BMPR1A, BRCA1, BRCA2, BRIP1, CDH1, CDK4, CDKN2A, CHEK2, DICER1, EPCAM, GREM1, HOXB13, MEN1, MLH1, MSH2, MSH3, MSH6, MUTYH, NBN, NF1, NF2, NTHL1, PALB2, PMS2, POLD1, POLE, PTEN, RAD51C, RAD51D, RECQL, RET, SDHA, SDHAF2, SDHB, SDHC, SDHD, SMAD4, SMARCA4, STK11, TP53, TSC1, TSC2, and VHL.  RNA data is routinely analyzed for use in variant interpretation for all genes.   03/05/2022 Imaging   EXAM: CT CHEST, ABDOMEN, AND PELVIS WITH CONTRAST  IMPRESSION: 1. Asymmetric, masslike density of the glandular tissue of the right breast with overlying skin thickening, in keeping with known primary breast malignancy. 2. Enlarged, ill-defined right axillary lymph node containing a biopsy marking clip, consistent with known nodal metastatic disease. 3. No other evidence of lymphadenopathy or metastatic disease in the chest, abdomen, or pelvis. 4. Emphysema. Background of fine centrilobular nodularity, most concentrated in the lung apices, consistent with smoking-related respiratory bronchiolitis. 5. Aneurysm of  the infrarenal abdominal aorta  measuring up to 5.4 x 5.3 cm with a large burden of eccentric mural thrombus. Recommend follow-up CT/MR every 6 months and vascular consultation. This recommendation follows ACR consensus guidelines: White Paper of the ACR Incidental Findings Committee II on Vascular Findings. J Am Coll Radiol 2013; 10:789-794. 6. Coronary artery disease.   03/05/2022 Imaging   EXAM: NUCLEAR MEDICINE WHOLE BODY BONE SCAN  IMPRESSION: No evidence of bony metastatic disease.   03/07/2022 - 08/01/2022 Chemotherapy   Patient is on Treatment Plan : BREAST  Docetaxel + Carboplatin + Trastuzumab + Pertuzumab  (TCHP) q21d      07/26/2022 Surgery   Right mastectomy with axillary node seed guided excision converted to axillary node dissection with reconstruction by Drs. Donne Hazel and Thimmappa   07/26/2022 Pathology Results   FINAL MICROSCOPIC DIAGNOSIS:   A. LYMPH NODE, RIGHT AXILLARY TARGETED, EXCISION:  - Metastatic carcinoma involving one lymph node, 2 cm (1/1).  - Focal extranodal extension.  - Biopsy site and biopsy clip.   B. BREAST, RIGHT, MASTECTOMY:  - Invasive lobular carcinoma, 9.5 cm (ypT3).  - Carcinoma involves dermis of nipple.  - All surgical margins negative for carcinoma.  - One lymph node negative for metastatic carcinoma (0/1).  - Biopsy sites and biopsy clips.  - See oncology table.   C. LYMPH NODE, RIGHT AXILLARY, SENTINEL, EXCISION:  - One lymph node negative for metastatic carcinoma (0/1).   D. LYMPH NODE, RIGHT AXILLARY, SENTINEL, EXCISION:  - Metastatic carcinoma in one lymph node, 0.5 cm. (1/1).   E. LYMPH NODE, RIGHT AXILLARY, SENTINEL, EXCISION:  - One lymph node negative for metastatic carcinoma (0/1).    07/26/2022 Cancer Staging   Cancer Staging  Malignant neoplasm of overlapping sites of right breast in female, estrogen receptor positive ([place]) Staging form: Breast, AJCC 8th Edition - Clinical stage from 02/09/2022: Stage IIIA (cT3, cN1, cM0, G2, ER+, PR-, HER2+) - Signed by  Truitt Merle, MD on 03/05/2022 Stage prefix: Initial diagnosis Histologic grading system: 3 grade system - Pathologic stage from 07/26/2022: Stage IIIA (pT3, pN1a, cM0, G2, ER+, PR-, HER2+) - Signed by Alla Feeling, NP on 08/01/2022 Histologic grading system: 3 grade system    07/26/2022 Cancer Staging   Staging form: Breast, AJCC 8th Edition - Pathologic stage from 07/26/2022: Stage IIIA (pT3, pN1a, cM0, G2, ER+, PR-, HER2+) - Signed by Alla Feeling, NP on 08/01/2022 Histologic grading system: 3 grade system   08/24/2022 -  Chemotherapy   Patient is on Treatment Plan : BREAST ADO-Trastuzumab Emtansine (Kadcyla) q21d        INTERVAL HISTORY:  Ashley Robertson is here for a follow up of breast cancer. She was last seen by NP Lacie on 08/01/22. She presents to the clinic alone. She reports she is doing well overall. She notes her breast incisions are healing well.   All other systems were reviewed with the patient and are negative.  MEDICAL HISTORY:  Past Medical History:  Diagnosis Date   Allergy    Lisinopril and lipitor   Anxiety and depression    Aortic atherosclerosis ([place]) 03/2020   CT Chest: 2 V (LAD & LCx) Coronary Atherosclerosis, Aortic Atherosclerosis (no aneurysm).  Mild centrilobular emphysema with mild diffuse bronchial thickening; several scattered small solitary pulmonary nodules (largest 5.6 mm in anterior left upper lobe)   Breast cancer ([place]) 12/2021   Right breast ILC   Carotid artery plaque, bilateral 10/2015   Mild to moderate plaque L>R without significant  stenosis   COPD (chronic obstructive pulmonary disease) ([facility])    Coronary Artery Calcification - Score 79    Coronary Calcium Score 79.  LAD and circumflex calcification noted.  Normal ascending aorta with mild calcification.   Current every day smoker    pt quit smoking April 2023   Emphysema lung [facility])    Noted on chest CT   Family history of breast cancer 02/21/2022   Family history of prostate cancer  02/21/2022   Hyperlipidemia    Hypertension    Controlled with amlodipine   Prediabetes    Skin cancer 2021   Remove 2022    SURGICAL HISTORY: Past Surgical History:  Procedure Laterality Date   BREAST BIOPSY Right    times 3   BREAST RECONSTRUCTION WITH PLACEMENT OF TISSUE EXPANDER AND ALLODERM Right 07/26/2022   Procedure: RIGHT BREAST RECONSTRUCTION WITH PLACEMENT OF TISSUE EXPANDER AND ALLODERM;  Surgeon: Irene Limbo, MD;  Location: [facility];  Service: Plastics;  Laterality: Right;   [place]   Rhinoplasty   MASTECTOMY W/ SENTINEL NODE BIOPSY Right 07/26/2022   Procedure: RIGHT MASTECTOMY, RIGHT AXILLARY SENTINEL NODE BIOPSY;  Surgeon: Rolm Bookbinder, MD;  Location: [facility];  Service: General;  Laterality: Right;  GEN & PEC BLOCK   PORTACATH PLACEMENT Left 03/06/2022   Procedure: INSERTION PORT-A-CATH;  Surgeon: Rolm Bookbinder, MD;  Location: [facility];  Service: General;  Laterality: Left;   RADIOACTIVE SEED GUIDED AXILLARY SENTINEL LYMPH NODE Right 07/26/2022   Procedure: RADIOACTIVE SEED GUIDED AXILLARY SENTINEL LYMPH NODE DISSECTION;  Surgeon: Rolm Bookbinder, MD;  Location: [facility];  Service: General;  Laterality: Right;   [place] ECHOCARDIOGRAM  10/2016   EF 55 to 60%.  Normal systolic and diastolic function.  No ASD/PFO   TUBAL LIGATION  1991    I have reviewed the social history and family history with the patient and they are unchanged from previous note.  ALLERGIES:  is allergic to lisinopril and lipitor [atorvastatin].  MEDICATIONS:  Current Outpatient Medications  Medication Sig Dispense Refill   albuterol (PROVENTIL HFA) 108 (90 Base) MCG/ACT inhaler Inhale 1 to 2 puffs into the lungs every 6 hours as needed 8.5 g 2   amLODipine (NORVASC) 10 MG tablet Take 1 tablet (10 mg total) by mouth at bedtime. 90 tablet 3   aspirin 81 MG tablet Take 81 mg  by mouth at bedtime.     Calcium Carb-Cholecalciferol (CALCIUM 500 + D3 PO) Take 1 tablet by mouth daily.     Cholecalciferol (VITAMIN D) 50 MCG (2000 UT) CAPS Take 2,000 Units by mouth daily.     FLUoxetine (PROZAC) 20 MG capsule Take 1 capsule by mouth daily. 90 capsule 1   Fluticasone-Umeclidin-Vilant (TRELEGY ELLIPTA) 200-62.5-25 MCG/ACT AEPB Inhale 1 puff into the lungs daily 60 each 2   OVER THE COUNTER MEDICATION Take 1 capsule by mouth daily. Serrapeptase 120,000 units daily  Reported on 12/27/2015     rosuvastatin (CRESTOR) 5 MG tablet Take 1 tablet by mouth daily at bedtime ,except on Monday, Wednesday and Fridays take 2 tablets at bedtime as directed 180 tablet 3   Specialty Vitamins Products (MAGNESIUM, AMINO ACID CHELATE,) 133 MG tablet Take 1 tablet by mouth at bedtime. PURE magnesium supplement - 120 mg of mag per capsule     No current facility-administered medications for this visit.    PHYSICAL EXAMINATION: ECOG  PERFORMANCE STATUS: 0 - Asymptomatic  Vitals:   08/23/22 1213  BP: (!) 171/79  Pulse: 61  Resp: 20  Temp: 98.2 F (36.8 C)  SpO2: 94%   Wt Readings from Last 3 Encounters:  08/23/22 160 lb 9.6 oz (72.8 kg)  08/23/22 162 lb 1.6 oz (73.5 kg)  08/15/22 153 lb (69.4 kg)     GENERAL:alert, no distress and comfortable SKIN: skin color, texture, turgor are normal, no rashes or significant lesions EYES: normal, Conjunctiva are pink and non-injected, sclera clear Musculoskeletal:no cyanosis of digits and no clubbing  NEURO: alert & oriented x 3 with fluent speech, no focal motor/sensory deficits BREAST: right breast incisions healing well, no skin breakdown or discharge.  LABORATORY DATA:  I have reviewed the data as listed    Latest Ref Rng & Units 08/23/2022   11:35 AM 08/01/2022    9:47 AM 07/11/2022    8:09 AM  CBC  WBC 4.0 - 10.5 K/uL 4.5  5.9  7.6   Hemoglobin 12.0 - 15.0 g/dL 12.7  12.0  11.7   Hematocrit 36.0 - 46.0 % 38.4  35.7  34.4   Platelets  150 - 400 K/uL 280  228  298         Latest Ref Rng & Units 08/23/2022   11:35 AM 08/01/2022    9:47 AM 07/11/2022    8:09 AM  CMP  Glucose 70 - 99 mg/dL 110  114  118   BUN 8 - 23 mg/dL _0 Creatinine 0.44 - 1.00 mg/dL 0.64  0.57  0.57   Sodium 135 - 145 mmol/L 136  136  137   Potassium 3.5 - 5.1 mmol/L 4.3  3.9  4.2   Chloride 98 - 111 mmol/L 103  104  103   CO2 22 - 32 mmol/L _1 Calcium 8.9 - 10.3 mg/dL 9.0  9.4  9.1   Total Protein 6.5 - 8.1 g/dL 6.8  6.5  6.9   Total Bilirubin 0.3 - 1.2 mg/dL 0.4  0.4  0.3   Alkaline Phos 38 - 126 U/L 110  96  98   AST 15 - 41 U/L _2 ALT 0 - 44 U/L _3 RADIOGRAPHIC STUDIES: I have personally reviewed the radiological images as listed and agreed with the findings in the report. No results found.    Orders Placed This Encounter  Procedures   CBC with Differential ([facility] Only)    Standing Status:   Future    Standing Expiration Date:   10/04/2023   CMP ([facility] only)    Standing Status:   Future    Standing Expiration Date:   10/04/2023   All questions were answered. The patient knows to call the clinic with any problems, questions or concerns. No barriers to learning was detected. The total time spent in the appointment was 30 minutes.     Truitt Merle, MD 08/23/2022   I, Wilburn Mylar, am acting as scribe for Truitt Merle, MD.   I have reviewed the above documentation for accuracy and completeness, and I agree with the above.

## 2022-08-23 NOTE — Progress Notes (Signed)
RT breast consult nursing interview. I verified patient's identity and began nursing interview. No RT breast discomfort reported at this time.   Meaningful use complete. Postmenopausal- NO chances of pregnancy.  BP (!) 173/72 (BP Location: Left Arm, Patient Position: Sitting, Cuff Size: Normal)   Pulse (!) 56   Temp 97.8 F (36.6 C) (Oral)   Resp 20   Ht '5\' 9"'$  (1.753 m)   Wt 160 lb 9.6 oz (72.8 kg)   SpO2 98%   BMI 23.72 kg/m   SN: Patient asks that we be gentle w/ her LT hand, due to a FX from a fall several months ago and is still healing 3/10.

## 2022-08-24 ENCOUNTER — Inpatient Hospital Stay: Payer: PPO

## 2022-08-24 ENCOUNTER — Other Ambulatory Visit: Payer: Self-pay

## 2022-08-24 VITALS — BP 163/79 | HR 63 | Temp 97.8°F | Resp 18

## 2022-08-24 DIAGNOSIS — C50811 Malignant neoplasm of overlapping sites of right female breast: Secondary | ICD-10-CM | POA: Diagnosis not present

## 2022-08-24 DIAGNOSIS — Z5112 Encounter for antineoplastic immunotherapy: Secondary | ICD-10-CM | POA: Diagnosis not present

## 2022-08-24 DIAGNOSIS — Z170 Estrogen receptor positive status [ER+]: Secondary | ICD-10-CM | POA: Diagnosis not present

## 2022-08-24 LAB — CANCER ANTIGEN 27.29: CA 27.29: 19.6 U/mL (ref 0.0–38.6)

## 2022-08-24 MED ORDER — SODIUM CHLORIDE 0.9 % IV SOLN
3.6000 mg/kg | Freq: Once | INTRAVENOUS | Status: AC
  Administered 2022-08-24: 260 mg via INTRAVENOUS
  Filled 2022-08-24: qty 5

## 2022-08-24 MED ORDER — DIPHENHYDRAMINE HCL 25 MG PO CAPS
50.0000 mg | ORAL_CAPSULE | Freq: Once | ORAL | Status: AC
  Administered 2022-08-24: 50 mg via ORAL
  Filled 2022-08-24: qty 2

## 2022-08-24 MED ORDER — ACETAMINOPHEN 325 MG PO TABS
650.0000 mg | ORAL_TABLET | Freq: Once | ORAL | Status: AC
  Administered 2022-08-24: 650 mg via ORAL
  Filled 2022-08-24: qty 2

## 2022-08-24 MED ORDER — HEPARIN SOD (PORK) LOCK FLUSH 100 UNIT/ML IV SOLN
500.0000 [IU] | Freq: Once | INTRAVENOUS | Status: AC | PRN
  Administered 2022-08-24: 500 [IU]

## 2022-08-24 MED ORDER — SODIUM CHLORIDE 0.9% FLUSH
10.0000 mL | INTRAVENOUS | Status: DC | PRN
  Administered 2022-08-24: 10 mL

## 2022-08-24 MED ORDER — SODIUM CHLORIDE 0.9 % IV SOLN: Freq: Once | INTRAVENOUS | Status: AC

## 2022-08-24 MED ORDER — PROCHLORPERAZINE MALEATE 10 MG PO TABS
10.0000 mg | ORAL_TABLET | Freq: Once | ORAL | Status: AC
  Administered 2022-08-24: 10 mg via ORAL
  Filled 2022-08-24: qty 1

## 2022-08-24 MED ORDER — PROCHLORPERAZINE EDISYLATE 10 MG/2ML IJ SOLN: 10.0000 mg | Freq: Once | INTRAMUSCULAR | Status: DC

## 2022-08-24 NOTE — Patient Instructions (Signed)
[facility] MEDICAL ONCOLOGY  Discharge Instructions: Thank you for choosing [facility] to provide your oncology and hematology care.   If you have a lab appointment with the Cancer Center, please go directly to the Cancer Center and check in at the registration area.   Wear comfortable clothing and clothing appropriate for easy access to any Portacath or PICC line.   We strive to give you quality time with your provider. You may need to reschedule your appointment if you arrive late (15 or more minutes).  Arriving late affects you and other patients whose appointments are after yours.  Also, if you miss three or more appointments without notifying the office, you may be dismissed from the clinic at the provider's discretion.      For prescription refill requests, have your pharmacy contact our office and allow 72 hours for refills to be completed.    Today you received the following chemotherapy and/or immunotherapy agents Kadcyla      To help prevent nausea and vomiting after your treatment, we encourage you to take your nausea medication as directed.  BELOW ARE SYMPTOMS THAT SHOULD BE REPORTED IMMEDIATELY: *FEVER GREATER THAN 100.4 F (38 C) OR HIGHER *CHILLS OR SWEATING *NAUSEA AND VOMITING THAT IS NOT CONTROLLED WITH YOUR NAUSEA MEDICATION *UNUSUAL SHORTNESS OF BREATH *UNUSUAL BRUISING OR BLEEDING *URINARY PROBLEMS (pain or burning when urinating, or frequent urination) *BOWEL PROBLEMS (unusual diarrhea, constipation, pain near the anus) TENDERNESS IN MOUTH AND THROAT WITH OR WITHOUT PRESENCE OF ULCERS (sore throat, sores in mouth, or a toothache) UNUSUAL RASH, SWELLING OR PAIN  UNUSUAL VAGINAL DISCHARGE OR ITCHING   Items with * indicate a potential emergency and should be followed up as soon as possible or go to the Emergency Department if any problems should occur.  Please show the CHEMOTHERAPY ALERT CARD or IMMUNOTHERAPY ALERT CARD at check-in to the  Emergency Department and triage nurse.  Should you have questions after your visit or need to cancel or reschedule your appointment, please contact [facility] MEDICAL [facility]COL[facility]  Dept: [phone]  and follow the prompts.  Office hours are 8:00 a.m. to 4:30 p.m. Monday - Friday. Please note that voicemails left after 4:00 p.m. may not be returned until the following business day.  We are closed weekends and major holidays. You have access to a nurse at all times for urgent questions. Please call the main number to the clinic Dept: [phone] and follow the prompts.   For any non-urgent questions, you may also contact your provider using MyChart. We now offer e-Visits for anyone 18 and older to request care online for non-urgent symptoms. For details visit [email or web address].   Also download the MyChart app! Go to the app store, search "MyChart", open the app, select [facility], and log in with your MyChart username and password.  Masks are optional in the cancer centers. If you would like for your care team to wear a mask while they are taking care of you, please let them know. You may have one support person who is at least 67 years old accompany you for your appointments.  Ado-Trastuzumab Emtansine Injection What is this medication? ADO-TRASTUZUMAB EMTANSINE (ADD oh traz TOO zuh mab em TAN zine) treats breast cancer. It works by blocking a protein that causes cancer cells to grow and multiply. This helps to slow or stop the spread of cancer cells. This medicine may be used for other purposes; ask your health care provider or pharmacist   if you have questions. COMMON BRAND NAME(S): Kadcyla What should I tell my care team before I take this medication? They need to know if you have any of these conditions: Heart failure Liver disease Low platelet levels Lung disease Tingling of the fingers or toes or other nerve disorder An unusual or allergic reaction to ado-trastuzumab  emtansine, other medications, foods, dyes, or preservatives Pregnant or trying to get pregnant Breast-feeding How should I use this medication? This medication is infused into a vein. It is given by your care team in a hospital or clinic setting. Talk to your care team about the use of this medication in children. Special care may be needed. Overdosage: If you think you have taken too much of this medicine contact a poison control center or emergency room at once. NOTE: This medicine is only for you. Do not share this medicine with others. What if I miss a dose? Keep appointments for follow-up doses. It is important not to miss your dose. Call your care team if you are unable to keep an appointment. What may interact with this medication? Atazanavir Boceprevir Clarithromycin Dalfopristin; quinupristin Delavirdine Indinavir Isoniazid, INH Itraconazole Ketoconazole Nefazodone Nelfinavir Ritonavir Telaprevir Telithromycin Tipranavir Voriconazole This list may not describe all possible interactions. Give your health care provider a list of all the medicines, herbs, non-prescription drugs, or dietary supplements you use. Also tell them if you smoke, drink alcohol, or use illegal drugs. Some items may interact with your medicine. What should I watch for while using this medication? This medication may make you feel generally unwell. This is not uncommon, as chemotherapy can affect healthy cells as well as cancer cells. Report any side effects. Continue your course of treatment even though you feel ill unless your care team tells you to stop. You may need blood work while taking this medication. This medication may increase your risk to bruise or bleed. Call your care team if you notice any unusual bleeding. Be careful brushing or flossing your teeth or using a toothpick because you may get an infection or bleed more easily. If you have any dental work done, tell your dentist you are  receiving this medication. Talk to your care team if you may be pregnant. Serious birth defects can occur if you take this medication during pregnancy and for 7 months after the last dose. You will need a negative pregnancy test before starting this medication. Contraception is recommended while taking this medication and for 7 months after the last dose. Your care team can help you find the option that works for you. If your partner can get pregnant, use a condom during sex while taking this medication and for 4 months after the last dose. Do not breastfeed while taking this medication and for 7 months after the last dose. This medication may cause infertility. Talk to your care team if you are concerned with your fertility. What side effects may I notice from receiving this medication? Side effects that you should report to your care team as soon as possible: Allergic reactions--skin rash, itching, hives, swelling of the face, lips, tongue, or throat Bleeding--bloody or black, tar-like stools, vomiting blood or brown material that looks like coffee grounds, red or dark brown urine, small red or purple spots on skin, unusual bruising or bleeding Dry cough, shortness of breath or trouble breathing Heart failure--shortness of breath, swelling of the ankles, feet, or hands, sudden weight gain, unusual weakness or fatigue Infusion reactions--chest pain, shortness of breath or trouble breathing,   feeling faint or lightheaded Liver injury--right upper belly pain, loss of appetite, nausea, light-colored stool, dark yellow or brown urine, yellowing skin or eyes, unusual weakness or fatigue Pain, tingling, or numbness in the hands or feet Painful swelling, warmth, or redness of the skin, blisters or sores at the infusion site Side effects that usually do not require medical attention (report to your care team if they continue or are bothersome): Constipation Fatigue Headache Muscle pain Nausea This list  may not describe all possible side effects. Call your doctor for medical advice about side effects. You may report side effects to FDA at 1-800-FDA-1088. Where should I keep my medication? This medication is given in a hospital or clinic. It will not be stored at home. NOTE: This sheet is a summary. It may not cover all possible information. If you have questions about this medicine, talk to your doctor, pharmacist, or health care provider.  2023 Elsevier/Gold Standard (2022-04-06 00:00:00) 

## 2022-08-27 DIAGNOSIS — M25532 Pain in left wrist: Secondary | ICD-10-CM | POA: Diagnosis not present

## 2022-08-27 DIAGNOSIS — M6281 Muscle weakness (generalized): Secondary | ICD-10-CM | POA: Diagnosis not present

## 2022-08-27 DIAGNOSIS — M25632 Stiffness of left wrist, not elsewhere classified: Secondary | ICD-10-CM | POA: Diagnosis not present

## 2022-08-27 DIAGNOSIS — S52592S Other fractures of lower end of left radius, sequela: Secondary | ICD-10-CM | POA: Diagnosis not present

## 2022-08-27 NOTE — Therapy (Signed)
OUTPATIENT PHYSICAL THERAPY BREAST CANCER POST OP FOLLOW UP   Patient Name: Ashley Robertson MRN: [id number] DOB:06-12-1956, 66 y.o., female Today's Date: 08/28/2022   PT End of Session - 08/28/22 0755     Visit Number 2    Number of Visits 2    Date for PT Re-Evaluation 08/28/22    PT Start Time 0758    PT Stop Time 0840    PT Time Calculation (min) 42 min    Activity Tolerance Patient tolerated treatment well    Behavior During Therapy [facility] for tasks assessed/performed             Past Medical History:  Diagnosis Date   Allergy    Lisinopril and lipitor   Anxiety and depression    Aortic atherosclerosis ([place]) 03/2020   CT Chest: 2 V (LAD & LCx) Coronary Atherosclerosis, Aortic Atherosclerosis (no aneurysm).  Mild centrilobular emphysema with mild diffuse bronchial thickening; several scattered small solitary pulmonary nodules (largest 5.6 mm in anterior left upper lobe)   Breast cancer ([place]) 12/2021   Right breast ILC   Carotid artery plaque, bilateral 10/2015   Mild to moderate plaque L>R without significant stenosis   COPD (chronic obstructive pulmonary disease) (HCC)    Coronary Artery Calcification - Score 79    Coronary Calcium Score 79.  LAD and circumflex calcification noted.  Normal ascending aorta with mild calcification.   Current every day smoker    pt quit smoking April 2023   Emphysema lung [facility])    Noted on chest CT   Family history of breast cancer 02/21/2022   Family history of prostate cancer 02/21/2022   Hyperlipidemia    Hypertension    Controlled with amlodipine   Prediabetes    Skin cancer 2021   Remove 2022   Past Surgical History:  Procedure Laterality Date   BREAST BIOPSY Right    times 3   BREAST RECONSTRUCTION WITH PLACEMENT OF TISSUE EXPANDER AND ALLODERM Right 07/26/2022   Procedure: RIGHT BREAST RECONSTRUCTION WITH PLACEMENT OF TISSUE EXPANDER AND ALLODERM;  Surgeon: Irene Limbo, MD;  Location: [place];  Service: Plastics;   Laterality: Right;   [place]   Rhinoplasty   MASTECTOMY W/ SENTINEL NODE BIOPSY Right 07/26/2022   Procedure: RIGHT MASTECTOMY, RIGHT AXILLARY SENTINEL NODE BIOPSY;  Surgeon: Rolm Bookbinder, MD;  Location: [place];  Service: General;  Laterality: Right;  GEN & PEC BLOCK   PORTACATH PLACEMENT Left 03/06/2022   Procedure: INSERTION PORT-A-CATH;  Surgeon: Rolm Bookbinder, MD;  Location: [place];  Service: General;  Laterality: Left;   RADIOACTIVE SEED GUIDED AXILLARY SENTINEL LYMPH NODE Right 07/26/2022   Procedure: RADIOACTIVE SEED GUIDED AXILLARY SENTINEL LYMPH NODE DISSECTION;  Surgeon: Rolm Bookbinder, MD;  Location: [place];  Service: General;  Laterality: Right;   [place] ECHOCARDIOGRAM  10/2016   EF 55 to 60%.  Normal systolic and diastolic function.  No ASD/PFO   TUBAL LIGATION  1991   Patient Active Problem List   Diagnosis Date Noted   Aortic atherosclerosis ([place]) 08/10/2022   S/P mastectomy, right 07/26/2022   Carotid atherosclerosis 03/28/2022   Coronary artery disease 03/28/2022   Pulmonary emphysema ([place]) 03/28/2022   Recurrent major depression in remission ([place]) 03/28/2022   Port-A-Cath in place 03/27/2022   Genetic testing 03/12/2022   Family history of breast cancer 02/21/2022   Family history of prostate  cancer 02/21/2022   Hypercholesterolemia 02/21/2022   Malignant neoplasm of overlapping sites of right breast in female, estrogen receptor positive ([place]) 02/16/2022   Bilateral carotid bruits 04/26/2020   Essential hypertension 03/19/2020   Hyperlipidemia with target LDL less than 100 03/19/2020   Prediabetes 03/19/2020   Agatston CAC score, <100 03/15/2020    PCP: Cari Caraway, MD  REFERRING PROVIDER: Rolm Bookbinder MD  REFERRING DIAG: Right Breast Cancer  THERAPY DIAG:  Malignant neoplasm of upper-outer quadrant of right breast in  female, estrogen receptor positive ([place])  Abnormal posture  Rationale for Evaluation and Treatment Rehabilitation  ONSET DATE: 02/09/2022  SUBJECTIVE:                                                                                                                                                                                           SUBJECTIVE STATEMENT: Surgery went very well. I don't feel like I have any ROM restrictions. I have a little pocket that is maybe swelling on the right underarm region but Im not sure if that is normal. Sleeping pretty well and I can sleep on my right side too. She does not feel any limitations. Having therapy on left wrist that she fx on June 6th.  PERTINENT HISTORY:  Patient was diagnosed on 02/08/2022 with right grade II invasive lobular carcinoma breast cancer. It measured 2.6 cm and 3.3 cm (2 masses) and is located in the upper outer quadrant. It is ER positive, PR negative, and HER2 positive with a Ki67 of 10%. Patient has COPD and hypertension and a 5cm AAA. She is-s/p right mastectomy  with SLNB 07/26/2022 by Dr. Donne Hazel and reconstruction by Dr. Iran Planas. Path showed 9.5 cm invasive lobular carcinoma involving dermis of nipple, margins negative, 2/5 positive lymph nodes. -given the significant residual disease, the recommendation is for postmastectomy radiation and switch to adjuvant Kadcyla  PATIENT GOALS:  Reassess how my recovery is going related to arm function, pain, and swelling.  PAIN:  Are you having pain? No  PRECAUTIONS: Recent Surgery, right UE Lymphedema risk, Other: COPD, hypertension, 5 cm AAA with surgery recommended  ACTIVITY LEVEL / LEISURE: Ipad, read, care for grandchildren   OBJECTIVE:   PATIENT SURVEYS:  QUICK DASH: 0%  OBSERVATIONS:  Drain incisions continue with scabs and T junction of breast incision. Mild lateral trunk swelling, and under arm swelling  POSTURE:  Forward head, rounded shoulders  LYMPHEDEMA  ASSESSMENT:   UPPER EXTREMITY AROM/PROM:   A/PROM RIGHT  02/21/2022   Right 08/28/2022  Shoulder extension 52 56  Shoulder flexion 154 153  Shoulder abduction 168 178  Shoulder internal rotation 79 77  Shoulder external rotation 80 95                          (Blank rows = not tested)   A/PROM LEFT  02/21/2022  Shoulder extension 47  Shoulder flexion 135  Shoulder abduction 169  Shoulder internal rotation 73  Shoulder external rotation 82                          (Blank rows = not tested)     CERVICAL AROM:       Percent limited  Flexion 25% limited  Extension WNL  Right lateral flexion 25% limited  Left lateral flexion 25% limited  Right rotation 25% limited  Left rotation 25% limited                   UPPER EXTREMITY STRENGTH: WNL     LYMPHEDEMA ASSESSMENTS:    LANDMARK RIGHT  02/21/2022 Right 08/28/2022  10 cm proximal to olecranon process 24.5 25.3  Olecranon process 23.2 23.2  10 cm proximal to ulnar styloid process 19.6 19.6  Just proximal to ulnar styloid process 15.3 15.2  Across hand at thumb web space 19.2 19.4  At base of 2nd digit 7 6.8  (Blank rows = not tested)   LANDMARK LEFT  02/21/2022   10 cm proximal to olecranon process 23.3 25.4  Olecranon process 22.5   10 cm proximal to ulnar styloid process 18.9   Just proximal to ulnar styloid process 14.9   Across hand at thumb web space 19   At base of 2nd digit 6.8   (Blank rows = not tested)        Surgery type/Date: 07/26/2022 Right Mastectomy with SLNB and immediate reconstruction Number of lymph nodes removed: 2/5 Current/past treatment (chemo, radiation, hormone therapy): Neoadjuvant chemo, pending radiation Other symptoms:  Heaviness/tightness No Pain No Pitting edema No Infections No Decreased scar mobility not assessed secondary to still healing Stemmer sign No   PATIENT EDUCATION:  Education details: ABC class, SOZO screens, continue post op exs during and after radiation,, scar  massage when incisions healed, foam pad and use pad for underarm swelling Person educated: Patient Education method: Explanation, Demonstration, and Handouts Education comprehension: verbalized understanding   HOME EXERCISE PROGRAM:  Reviewed previously given post op HEP and advised to continue during and after radiation.   ASSESSMENT:  CLINICAL IMPRESSION: Pt is s/p right mastectomy with immediate expander placement and SLNB with 2/5 positive nodes. Surgery date was 07/26/2022. Pt has no complaints of pain, and ROM is WNL. There is no significant difference in circumferential measurements. There is very mild swelling inferior to the Right axillary region. We were able to adjust her bra and also gave her a foam pad so it would not cut into her skin under the arm. She was also given a bone pillow to use when sitting at home. She scheduled the ABC class and SOZO screen while here today. There are no other PT needs identified, but pt knows to contact us with questions or concerns.  Pt will benefit from skilled therapeutic intervention to improve on the following deficits: Decreased knowledge of precautions, impaired UE functional use, pain, decreased ROM, postural dysfunction.   PT treatment/interventions: ADL/Self care home management, Therapeutic exercises, Patient/Family education, Self Care, and scar mobilization     GOALS: Goals reviewed with patient? Yes  LONG TERM GOALS:  (STG=LTG)  GOALS Name Target Date  Goal status  1 Pt will demonstrate she has regained full shoulder ROM and function post operatively compared to baselines.  Baseline: 08/28/2022 MET  _0 PLAN: PT FREQUENCY/DURATION: No further visits scheduled due to no needs identified at this time.  PLAN FOR NEXT SESSION: Pt is discharged to independent self management  PHYSICAL THERAPY DISCHARGE SUMMARY  Visits from Start of Care: 2  Current functional level related to goals / functional  outcomes: Achieved goals   Remaining deficits: Mild swelling inferior to right axilla. Pt has adjusted her bra and was given a foam pad and pillow   Education / Equipment: Foam pad for axillary region, bone pillow for swelling   Patient agrees to discharge. Patient goals were met. Patient is being discharged due to meeting the stated rehab goals.   [facility]  [address]  [place] [address]  [phone]  After Breast Cancer Class It is recommended you attend the ABC class to be educated on lymphedema risk reduction. This class is free of charge and lasts for 1 hour. It is a 1-time class. You will need to download the Webex app either on your phone or computer. We will send you a link the night before or the morning of the class. You should be able to click on that link to join the class. This is not a confidential class. You don't have to turn your camera on, but other participants may be able to see your email address.  Scar massage You can begin gentle scar massage to you incision sites. Gently place one hand on the incision and move the skin (without sliding on the skin) in various directions. Do this for a few minutes and then you can gently massage either coconut oil or vitamin E cream into the scars.  Compression garment You should continue wearing your compression bra until you feel like you no longer have swelling.  Home exercise Program Continue doing the exercises you were given until you feel like you can do them without feeling any tightness at the end.   Walking Program Studies show that 30 minutes of walking per day (fast enough to elevate your heart rate) can significantly reduce the risk of a cancer recurrence. If you can't walk due to other medical reasons, we encourage you to find another activity you could do (like a stationary bike or water exercise).  Posture After breast cancer surgery, people frequently sit with rounded  shoulders posture because it puts their incisions on slack and feels better. If you sit like this and scar tissue forms in that position, you can become very tight and have pain sitting or standing with good posture. Try to be aware of your posture and sit and stand up tall to heal properly.  Follow up PT: It is recommended you return every 3 months for the first 3 years following surgery to be assessed on the SOZO machine for an L-Dex score. This helps prevent clinically significant lymphedema in 95% of patients. These follow up screens are 10 minute appointments that you are not billed for.  Claris Pong, PT 08/28/2022, 8:40 AM

## 2022-08-28 ENCOUNTER — Ambulatory Visit: Payer: PPO | Attending: General Surgery

## 2022-08-28 DIAGNOSIS — R293 Abnormal posture: Secondary | ICD-10-CM | POA: Insufficient documentation

## 2022-08-28 DIAGNOSIS — C50411 Malignant neoplasm of upper-outer quadrant of right female breast: Secondary | ICD-10-CM | POA: Diagnosis not present

## 2022-08-28 DIAGNOSIS — Z170 Estrogen receptor positive status [ER+]: Secondary | ICD-10-CM | POA: Diagnosis not present

## 2022-08-28 NOTE — Patient Instructions (Signed)
     [facility]  [address], [address]  [place] [address]  [phone]  After Breast Cancer Class It is recommended you attend the ABC class to be educated on lymphedema risk reduction. This class is free of charge and lasts for 1 hour. It is a 1-time class. You will need to download the Webex app either on your phone or computer. We will send you a link the night before or the morning of the class. You should be able to click on that link to join the class. This is not a confidential class. You don't have to turn your camera on, but other participants may be able to see your email address.  Scar massage You can begin gentle scar massage to you incision sites. Gently place one hand on the incision and move the skin (without sliding on the skin) in various directions. Do this for a few minutes and then you can gently massage either coconut oil or vitamin E cream into the scars.  Compression garment You should continue wearing your compression bra until you feel like you no longer have swelling.  Home exercise Program Continue doing the exercises you were given until you feel like you can do them without feeling any tightness at the end.   Walking Program Studies show that 30 minutes of walking per day (fast enough to elevate your heart rate) can significantly reduce the risk of a cancer recurrence. If you can't walk due to other medical reasons, we encourage you to find another activity you could do (like a stationary bike or water exercise).  Posture After breast cancer surgery, people frequently sit with rounded shoulders posture because it puts their incisions on slack and feels better. If you sit like this and scar tissue forms in that position, you can become very tight and have pain sitting or standing with good posture. Try to be aware of your posture and sit and stand up tall to heal properly.  Follow up PT: It is recommended you return every 3 months for  the first 2 years following surgery to be assessed on the SOZO machine for an L-Dex score. This helps prevent clinically significant lymphedema in 95% of patients. These follow up screens are 10 minute appointments that you are not billed for.  

## 2022-08-29 ENCOUNTER — Other Ambulatory Visit: Payer: Self-pay

## 2022-08-29 ENCOUNTER — Encounter: Payer: Self-pay | Admitting: *Deleted

## 2022-08-29 DIAGNOSIS — S52592S Other fractures of lower end of left radius, sequela: Secondary | ICD-10-CM | POA: Diagnosis not present

## 2022-08-29 DIAGNOSIS — M6281 Muscle weakness (generalized): Secondary | ICD-10-CM | POA: Diagnosis not present

## 2022-08-29 DIAGNOSIS — M25632 Stiffness of left wrist, not elsewhere classified: Secondary | ICD-10-CM | POA: Diagnosis not present

## 2022-08-29 DIAGNOSIS — M25532 Pain in left wrist: Secondary | ICD-10-CM | POA: Diagnosis not present

## 2022-08-30 ENCOUNTER — Telehealth: Payer: Self-pay

## 2022-08-30 ENCOUNTER — Ambulatory Visit (INDEPENDENT_AMBULATORY_CARE_PROVIDER_SITE_OTHER): Payer: PPO | Admitting: Pulmonary Disease

## 2022-08-30 ENCOUNTER — Encounter: Payer: Self-pay | Admitting: Pulmonary Disease

## 2022-08-30 ENCOUNTER — Other Ambulatory Visit (HOSPITAL_COMMUNITY): Payer: Self-pay

## 2022-08-30 VITALS — BP 120/68 | HR 60 | Temp 97.7°F | Ht 69.0 in | Wt 162.8 lb

## 2022-08-30 DIAGNOSIS — J449 Chronic obstructive pulmonary disease, unspecified: Secondary | ICD-10-CM

## 2022-08-30 MED ORDER — TRELEGY ELLIPTA 200-62.5-25 MCG/ACT IN AEPB: 1.0000 | INHALATION_SPRAY | Freq: Every day | RESPIRATORY_TRACT | 0 refills | Status: DC

## 2022-08-30 NOTE — Telephone Encounter (Signed)
Prices per test claims from insurance:  [facility]  Trelegy- $146.06 Breztri- $143.28  ICS+LABA  Advair Diskus- $34.81 Symbicort (Brand)- $89.32  LABA+LAMA  Stiolto- $107.42 Anoro- $105.46  LAMA  Incruse- $81.00 Spiriva Handi (Brand)-$117.13 Spiriva Respi- $117.13  LABA  Serevent Disk- $96.87

## 2022-08-30 NOTE — Addendum Note (Signed)
Addended by: Elton Sin on: 08/30/2022 12:27 PM   Modules accepted: Orders

## 2022-08-30 NOTE — Progress Notes (Signed)
Ashley Robertson    [id number]    [birth year]  Primary Care Physician:Feng, [name], MD  Referring Physician: Truitt Merle, MD [address] [place],  [place] [address]  Chief complaint: Follow up for COPD  HPI: 66 y.o. Ex smoker with history of hypertension, hyperlipidemia, emphysema Referred for evaluation of mild wheezing heard at primary care office  Symptoms are intermittent wheezing.  Occasional cough with white mucus.  No symptoms of dyspnea or exercise limitation.  She has an albuterol inhaler which she uses once a month.  No history of exacerbations or hospitalizations for respiratory issues  Pets: Dog Occupation: Retired Haematologist at [employer]: No mold, hot tub, Jacuzzi.  No feather pillows or comforter Smoking history: 41-pack-year smoker.  Quit smoking in April 2023 Travel history: No significant travel history Relevant family history: No family history of lung disease  Interim history: Continues on Trelegy inhaler which is helping with the breathing but is expensive Diagnosed with breast cancer and underwent chemotherapy, and is going to start radiation. Also noted to have an infrarenal aneurysm and is following with vascular surgery for this  Quit smoking in April 2023.  Outpatient Encounter Medications as of 08/30/2022  Medication Sig   albuterol (PROVENTIL HFA) 108 (90 Base) MCG/ACT inhaler Inhale 1 to 2 puffs into the lungs every 6 hours as needed   amLODipine (NORVASC) 10 MG tablet Take 1 tablet (10 mg total) by mouth at bedtime.   aspirin 81 MG tablet Take 81 mg by mouth at bedtime.   Calcium Carb-Cholecalciferol (CALCIUM 500 + D3 PO) Take 1 tablet by mouth daily.   Cholecalciferol (VITAMIN D) 50 MCG (2000 UT) CAPS Take 2,000 Units by mouth daily.   FLUoxetine (PROZAC) 20 MG capsule Take 1 capsule by mouth daily.   Fluticasone-Umeclidin-Vilant (TRELEGY ELLIPTA) 200-62.5-25 MCG/ACT AEPB Inhale 1 puff into the lungs daily   OVER THE  COUNTER MEDICATION Take 1 capsule by mouth daily. Serrapeptase 120,000 units daily  Reported on 12/27/2015   rosuvastatin (CRESTOR) 5 MG tablet Take 1 tablet by mouth daily at bedtime ,except on Monday, Wednesday and Fridays take 2 tablets at bedtime as directed   Specialty Vitamins Products (MAGNESIUM, AMINO ACID CHELATE,) 133 MG tablet Take 1 tablet by mouth at bedtime. PURE magnesium supplement - 120 mg of mag per capsule   [DISCONTINUED] hydrochlorothiazide (HYDRODIURIL) 25 MG tablet Take 1 tablet (25 mg total) by mouth daily.   [DISCONTINUED] lisinopril (ZESTRIL) 5 MG tablet Take 1 tablet (5 mg total) by mouth daily.   No facility-administered encounter medications on file as of 08/30/2022.    Physical Exam: Blood pressure 120/68, pulse 60, temperature 97.7 F (36.5 C), temperature source Oral, height '5\' 9"'$  (1.753 m), weight 162 lb 12.8 oz (73.8 kg), SpO2 96 %. Gen:      No acute distress HEENT:  EOMI, sclera anicteric Neck:     No masses; no thyromegaly Lungs:    Clear to auscultation bilaterally; normal respiratory effort CV:         Regular rate and rhythm; no murmurs Abd:      + bowel sounds; soft, non-tender; no palpable masses, no distension Ext:    No edema; adequate peripheral perfusion Skin:      Warm and dry; no rash Neuro: alert and oriented x 3 Psych: normal mood and affect   Data Reviewed: Imaging: CT chest 03/09/2020- mild emphysema, 5 proximal 6 mm lung nodule, coronary atherosclerosis.  Screening CT chest 04/10/2021-mild centrilobular emphysema, mild biapical scarring stable pulmonary nodule.  I have reviewed the images personally.  PFTs: 04/10/2021 FVC 3.56 [93%], FEV1 2.22 [76%], F/F 63, TLC 7.23 [124%], DLCO 18.57 [79%] Moderate obstruction with bronchodilator response  Labs: CBC 08/31/2021- WBC 7.5, eos 1.5%, absolute eosinophil count 112.5 IgE 08/31/2021- 20 Alpha-1 antitrypsin 08/31/2021- 205, PI MM  Assessment:  COPD, emphysema PFTs reviewed with moderate  obstruction bronchodilator response Continue Trelegy inhaler  Lung nodule We will likely need to stop low-dose screening CT due to new diagnosis of lung cancer.  Follow lung nodules on surveillance CTs of the chest.    Plan/Recommendations: Continue Trelegy  Marshell Garfinkel MD [facility] 08/30/2022, 9:48 AM  CC: Truitt Merle, MD

## 2022-08-30 NOTE — Patient Instructions (Signed)
I am glad you are doing well with your breathing Continue the Trelegy inhaler.  We will see if you have samples I will check with pharmacy to see if there are cheaper alternatives Follow-up in 6 months

## 2022-09-03 DIAGNOSIS — M25532 Pain in left wrist: Secondary | ICD-10-CM | POA: Diagnosis not present

## 2022-09-03 DIAGNOSIS — M25632 Stiffness of left wrist, not elsewhere classified: Secondary | ICD-10-CM | POA: Diagnosis not present

## 2022-09-03 DIAGNOSIS — S52592S Other fractures of lower end of left radius, sequela: Secondary | ICD-10-CM | POA: Diagnosis not present

## 2022-09-03 DIAGNOSIS — M6281 Muscle weakness (generalized): Secondary | ICD-10-CM | POA: Diagnosis not present

## 2022-09-03 NOTE — Telephone Encounter (Signed)
Please let patient know that we checked with the pharmacy team and trelegy inhaler is the cheapest option for her.

## 2022-09-04 ENCOUNTER — Other Ambulatory Visit: Payer: Self-pay

## 2022-09-04 ENCOUNTER — Ambulatory Visit
Admission: RE | Admit: 2022-09-04 | Discharge: 2022-09-04 | Disposition: A | Discharge status: Home / Self Care | Payer: PPO | Source: Ambulatory Visit | Attending: Radiation Oncology | Admitting: Radiation Oncology

## 2022-09-04 DIAGNOSIS — C50811 Malignant neoplasm of overlapping sites of right female breast: Secondary | ICD-10-CM | POA: Diagnosis not present

## 2022-09-04 DIAGNOSIS — Z170 Estrogen receptor positive status [ER+]: Secondary | ICD-10-CM | POA: Insufficient documentation

## 2022-09-04 NOTE — Telephone Encounter (Signed)
ATC patient.  LM to call back. 

## 2022-09-05 DIAGNOSIS — M25532 Pain in left wrist: Secondary | ICD-10-CM | POA: Diagnosis not present

## 2022-09-05 DIAGNOSIS — M6281 Muscle weakness (generalized): Secondary | ICD-10-CM | POA: Diagnosis not present

## 2022-09-05 DIAGNOSIS — M25632 Stiffness of left wrist, not elsewhere classified: Secondary | ICD-10-CM | POA: Diagnosis not present

## 2022-09-05 DIAGNOSIS — S52592S Other fractures of lower end of left radius, sequela: Secondary | ICD-10-CM | POA: Diagnosis not present

## 2022-09-06 ENCOUNTER — Other Ambulatory Visit: Payer: Self-pay

## 2022-09-06 ENCOUNTER — Other Ambulatory Visit (HOSPITAL_COMMUNITY): Payer: Self-pay

## 2022-09-06 DIAGNOSIS — S52592S Other fractures of lower end of left radius, sequela: Secondary | ICD-10-CM | POA: Diagnosis not present

## 2022-09-06 DIAGNOSIS — M25632 Stiffness of left wrist, not elsewhere classified: Secondary | ICD-10-CM | POA: Diagnosis not present

## 2022-09-10 DIAGNOSIS — M25632 Stiffness of left wrist, not elsewhere classified: Secondary | ICD-10-CM | POA: Diagnosis not present

## 2022-09-10 DIAGNOSIS — Z170 Estrogen receptor positive status [ER+]: Secondary | ICD-10-CM | POA: Diagnosis not present

## 2022-09-10 DIAGNOSIS — S52592S Other fractures of lower end of left radius, sequela: Secondary | ICD-10-CM | POA: Diagnosis not present

## 2022-09-10 DIAGNOSIS — M6281 Muscle weakness (generalized): Secondary | ICD-10-CM | POA: Diagnosis not present

## 2022-09-10 DIAGNOSIS — C50811 Malignant neoplasm of overlapping sites of right female breast: Secondary | ICD-10-CM | POA: Diagnosis not present

## 2022-09-10 DIAGNOSIS — M25532 Pain in left wrist: Secondary | ICD-10-CM | POA: Diagnosis not present

## 2022-09-11 ENCOUNTER — Other Ambulatory Visit: Payer: Self-pay

## 2022-09-11 ENCOUNTER — Ambulatory Visit
Admission: RE | Admit: 2022-09-11 | Discharge: 2022-09-11 | Disposition: A | Discharge status: Home / Self Care | Payer: PPO | Source: Ambulatory Visit | Attending: Radiation Oncology | Admitting: Radiation Oncology

## 2022-09-11 ENCOUNTER — Encounter: Payer: Self-pay | Admitting: *Deleted

## 2022-09-11 DIAGNOSIS — D1801 Hemangioma of skin and subcutaneous tissue: Secondary | ICD-10-CM | POA: Diagnosis not present

## 2022-09-11 DIAGNOSIS — L814 Other melanin hyperpigmentation: Secondary | ICD-10-CM | POA: Diagnosis not present

## 2022-09-11 DIAGNOSIS — C50811 Malignant neoplasm of overlapping sites of right female breast: Secondary | ICD-10-CM | POA: Diagnosis not present

## 2022-09-11 DIAGNOSIS — L821 Other seborrheic keratosis: Secondary | ICD-10-CM | POA: Diagnosis not present

## 2022-09-11 DIAGNOSIS — Z85828 Personal history of other malignant neoplasm of skin: Secondary | ICD-10-CM | POA: Diagnosis not present

## 2022-09-11 DIAGNOSIS — Z08 Encounter for follow-up examination after completed treatment for malignant neoplasm: Secondary | ICD-10-CM | POA: Diagnosis not present

## 2022-09-11 LAB — RAD ONC ARIA SESSION SUMMARY

## 2022-09-11 NOTE — Telephone Encounter (Signed)
Called and spoke with patient.  Patient aware inhalers were checked and Trelegy was cheapest at this time.  Understanding stated. Nothing further at this time.

## 2022-09-12 ENCOUNTER — Other Ambulatory Visit: Payer: Self-pay

## 2022-09-12 ENCOUNTER — Ambulatory Visit
Admission: RE | Admit: 2022-09-12 | Discharge: 2022-09-12 | Disposition: A | Discharge status: Home / Self Care | Payer: PPO | Source: Ambulatory Visit | Attending: Radiation Oncology | Admitting: Radiation Oncology

## 2022-09-12 ENCOUNTER — Other Ambulatory Visit (HOSPITAL_COMMUNITY): Payer: Self-pay

## 2022-09-12 DIAGNOSIS — Z510 Encounter for antineoplastic radiation therapy: Secondary | ICD-10-CM | POA: Diagnosis not present

## 2022-09-12 DIAGNOSIS — Z170 Estrogen receptor positive status [ER+]: Secondary | ICD-10-CM | POA: Diagnosis not present

## 2022-09-12 DIAGNOSIS — C50811 Malignant neoplasm of overlapping sites of right female breast: Secondary | ICD-10-CM | POA: Diagnosis not present

## 2022-09-12 LAB — RAD ONC ARIA SESSION SUMMARY

## 2022-09-13 ENCOUNTER — Encounter: Payer: Self-pay | Admitting: Hematology

## 2022-09-13 ENCOUNTER — Encounter: Payer: Self-pay | Admitting: *Deleted

## 2022-09-13 ENCOUNTER — Other Ambulatory Visit: Payer: Self-pay

## 2022-09-13 ENCOUNTER — Ambulatory Visit
Admission: RE | Admit: 2022-09-13 | Discharge: 2022-09-13 | Disposition: A | Discharge status: Home / Self Care | Payer: PPO | Source: Ambulatory Visit | Attending: Radiation Oncology | Admitting: Radiation Oncology

## 2022-09-13 ENCOUNTER — Inpatient Hospital Stay: Payer: PPO

## 2022-09-13 ENCOUNTER — Inpatient Hospital Stay (HOSPITAL_BASED_OUTPATIENT_CLINIC_OR_DEPARTMENT_OTHER): Payer: PPO | Admitting: Hematology

## 2022-09-13 VITALS — BP 143/82 | HR 62 | Temp 97.6°F | Resp 19 | Ht 69.0 in | Wt 159.3 lb

## 2022-09-13 VITALS — BP 153/83 | HR 57 | Temp 97.7°F | Resp 18

## 2022-09-13 DIAGNOSIS — Z95828 Presence of other vascular implants and grafts: Secondary | ICD-10-CM

## 2022-09-13 DIAGNOSIS — C50811 Malignant neoplasm of overlapping sites of right female breast: Secondary | ICD-10-CM

## 2022-09-13 DIAGNOSIS — Z8042 Family history of malignant neoplasm of prostate: Secondary | ICD-10-CM | POA: Insufficient documentation

## 2022-09-13 DIAGNOSIS — C773 Secondary and unspecified malignant neoplasm of axilla and upper limb lymph nodes: Secondary | ICD-10-CM | POA: Insufficient documentation

## 2022-09-13 DIAGNOSIS — Z79899 Other long term (current) drug therapy: Secondary | ICD-10-CM | POA: Insufficient documentation

## 2022-09-13 DIAGNOSIS — Z7982 Long term (current) use of aspirin: Secondary | ICD-10-CM | POA: Insufficient documentation

## 2022-09-13 DIAGNOSIS — Z5112 Encounter for antineoplastic immunotherapy: Secondary | ICD-10-CM | POA: Insufficient documentation

## 2022-09-13 DIAGNOSIS — Z510 Encounter for antineoplastic radiation therapy: Secondary | ICD-10-CM | POA: Diagnosis not present

## 2022-09-13 DIAGNOSIS — Z803 Family history of malignant neoplasm of breast: Secondary | ICD-10-CM | POA: Insufficient documentation

## 2022-09-13 DIAGNOSIS — Z9011 Acquired absence of right breast and nipple: Secondary | ICD-10-CM | POA: Insufficient documentation

## 2022-09-13 DIAGNOSIS — Z170 Estrogen receptor positive status [ER+]: Secondary | ICD-10-CM | POA: Insufficient documentation

## 2022-09-13 DIAGNOSIS — M8588 Other specified disorders of bone density and structure, other site: Secondary | ICD-10-CM | POA: Insufficient documentation

## 2022-09-13 LAB — CBC WITH DIFFERENTIAL (CANCER CENTER ONLY)
Abs Immature Granulocytes: 0 10*3/uL (ref 0.00–0.07)
Basophils Absolute: 0 10*3/uL (ref 0.0–0.1)
Basophils Relative: 1 %
Eosinophils Absolute: 0 10*3/uL (ref 0.0–0.5)
Eosinophils Relative: 1 %
HCT: 40 % (ref 36.0–46.0)
Hemoglobin: 13.5 g/dL (ref 12.0–15.0)
Immature Granulocytes: 0 %
Lymphocytes Relative: 20 %
Lymphs Abs: 0.8 10*3/uL (ref 0.7–4.0)
MCH: 31.7 pg (ref 26.0–34.0)
MCHC: 33.8 g/dL (ref 30.0–36.0)
MCV: 93.9 fL (ref 80.0–100.0)
Monocytes Absolute: 0.6 10*3/uL (ref 0.1–1.0)
Monocytes Relative: 15 %
Neutro Abs: 2.7 10*3/uL (ref 1.7–7.7)
Neutrophils Relative %: 63 %
Platelet Count: 227 10*3/uL (ref 150–400)
RBC: 4.26 MIL/uL (ref 3.87–5.11)
RDW: 13.1 % (ref 11.5–15.5)
WBC Count: 4.1 10*3/uL (ref 4.0–10.5)
nRBC: 0 % (ref 0.0–0.2)

## 2022-09-13 LAB — RAD ONC ARIA SESSION SUMMARY
Course Elapsed Days: 2
Plan Fractions Treated to Date: 2
Plan Fractions Treated to Date: 3
Plan Prescribed Dose Per Fraction: 1.8 Gy
Plan Prescribed Dose Per Fraction: 1.8 Gy
Plan Total Fractions Prescribed: 14
Plan Total Fractions Prescribed: 28
Plan Total Prescribed Dose: 25.2 Gy
Plan Total Prescribed Dose: 50.4 Gy
Reference Point Dosage Given to Date: 3.6 Gy
Reference Point Dosage Given to Date: 5.4 Gy
Reference Point Session Dosage Given: 1.8 Gy
Reference Point Session Dosage Given: 1.8 Gy
Session Number: 3

## 2022-09-13 LAB — CMP (CANCER CENTER ONLY)
ALT: 29 U/L (ref 0–44)
AST: 35 U/L (ref 15–41)
Albumin: 3.9 g/dL (ref 3.5–5.0)
Alkaline Phosphatase: 106 U/L (ref 38–126)
Anion gap: 7 (ref 5–15)
BUN: 14 mg/dL (ref 8–23)
CO2: 27 mmol/L (ref 22–32)
Calcium: 8.9 mg/dL (ref 8.9–10.3)
Chloride: 101 mmol/L (ref 98–111)
Creatinine: 0.7 mg/dL (ref 0.44–1.00)
GFR, Estimated: >60 mL/min (ref >60)
Glucose, Bld: 115 mg/dL — ABNORMAL HIGH (ref 70–99)
Potassium: 3.9 mmol/L (ref 3.5–5.1)
Sodium: 135 mmol/L (ref 135–145)
Total Bilirubin: 0.4 mg/dL (ref 0.3–1.2)
Total Protein: 7.1 g/dL (ref 6.5–8.1)

## 2022-09-13 MED ORDER — SODIUM CHLORIDE 0.9% FLUSH
10.0000 mL | Freq: Once | INTRAVENOUS | Status: AC
  Administered 2022-09-13: 10 mL

## 2022-09-13 MED ORDER — HEPARIN SOD (PORK) LOCK FLUSH 100 UNIT/ML IV SOLN
500.0000 [IU] | Freq: Once | INTRAVENOUS | Status: AC | PRN
  Administered 2022-09-13: 500 [IU]

## 2022-09-13 MED ORDER — ALRA NON-METALLIC DEODORANT (RAD-ONC)
1.0000 | Freq: Once | TOPICAL | Status: AC
  Administered 2022-09-13: 1 via TOPICAL

## 2022-09-13 MED ORDER — ACETAMINOPHEN 325 MG PO TABS
650.0000 mg | ORAL_TABLET | Freq: Once | ORAL | Status: AC
  Administered 2022-09-13: 650 mg via ORAL
  Filled 2022-09-13: qty 2

## 2022-09-13 MED ORDER — RADIAPLEXRX EX GEL: Freq: Once | CUTANEOUS | Status: AC

## 2022-09-13 MED ORDER — DIPHENHYDRAMINE HCL 25 MG PO CAPS
50.0000 mg | ORAL_CAPSULE | Freq: Once | ORAL | Status: AC
  Administered 2022-09-13: 50 mg via ORAL
  Filled 2022-09-13: qty 2

## 2022-09-13 MED ORDER — SODIUM CHLORIDE 0.9 % IV SOLN
3.6000 mg/kg | Freq: Once | INTRAVENOUS | Status: AC
  Administered 2022-09-13: 260 mg via INTRAVENOUS
  Filled 2022-09-13: qty 5

## 2022-09-13 MED ORDER — PROCHLORPERAZINE MALEATE 10 MG PO TABS
10.0000 mg | ORAL_TABLET | Freq: Once | ORAL | Status: AC
  Administered 2022-09-13: 10 mg via ORAL
  Filled 2022-09-13: qty 1

## 2022-09-13 MED ORDER — SODIUM CHLORIDE 0.9% FLUSH
10.0000 mL | INTRAVENOUS | Status: DC | PRN
  Administered 2022-09-13: 10 mL

## 2022-09-13 MED ORDER — SODIUM CHLORIDE 0.9 % IV SOLN: Freq: Once | INTRAVENOUS | Status: AC

## 2022-09-13 NOTE — Progress Notes (Signed)
Ashley Robertson   [phone] [phone]   Clinic Follow up Note   Patient Care Team: Truitt Merle, MD as PCP - General (Hematology) Leonie Man, MD as PCP - Cardiology (Cardiology) Mauro Kaufmann, RN as Oncology Nurse Navigator Rockwell Germany, RN as Oncology Nurse Navigator Rolm Bookbinder, MD as Consulting Physician (General Surgery) Truitt Merle, MD as Consulting Physician (Hematology) Kyung Rudd, MD as Consulting Physician (Radiation Oncology)  Date of Service:  09/13/2022  CHIEF COMPLAINT: f/u of right breast cancer  CURRENT THERAPY:  Kadcyla, q21d, starting 08/24/22  ASSESSMENT & PLAN:  Ashley Robertson is a 66 y.o. post-menopausal female with   1. Malignant neoplasm of overlapping sites of right breast, invasive lobular carcinoma, Stage IIIA, c(T3, N1), yp(T3, N1a), ER+/PR-/HER2+, Grade 2  -presented with new right breast mass. Biopsy on 02/09/22 revealed invasive lobular carcinoma to both sites and metastatic carcinoma in lymph node. Breast MRI on 03/01/22 showed: 8.5 cm area of confluent mass-like enhancement in right breast involving all 4 quadrants; biopsy-proven metastatic lymph node in right axilla. -staging CT CAP and bone scan on 03/05/22 were negative for metastatic disease. -baseline echo on 03/05/22 was normal (EF 55-60%). Repeat on 07/04/22 was stable. -she completed neoadjuvant TCHP 03/07/22 - 06/20/22. She tolerated very well. -s/p right mastectomy by Dr. Donne Hazel and reconstruction by Dr. Iran Planas. Path showed 9.5 cm invasive lobular carcinoma involving dermis of nipple, margins negative, 2/5 positive lymph nodes. -given the significant residual disease, she began C[place]ades on 08/24/22. She tolerated first dose well with no noticeable side effects. -she is also receiving postmastectomy radiation under Dr. Lisbeth Renshaw, 10/10 - 10/29/22. -lab reviewed, all WNL. She continues to do very well. Will proceed.   2. Genetics -she has a  family history of breast cancer in a paternal aunt and a niece, liver cancer in her maternal grandfather, and prostate cancer in her father. -testing performed on 02/21/22 was negative, with a VUS in [place]   3. Bone Health  -Her most recent DEXA was on 04/29/20 showing osteopenia (T-score of -2.1 at L1-3).     PLAN:  -proceed with Kadcyla today as scheduled -continue daily radiation -lab, flush, and kadcyla every 3 weeks -f/u every other treatment (in 6 weeks) -echo in 2-3 weeks, order placed    No problem-specific Assessment & Plan notes found for this encounter.   SUMMARY OF ONCOLOGIC HISTORY: Oncology History Overview Note   Cancer Staging  Malignant neoplasm of overlapping sites of right breast in female, estrogen receptor positive ([place]) Staging form: Breast, AJCC 8th Edition - Clinical stage from 02/09/2022: Stage IIA (cT2, cN1, cM0, G2, ER+, PR-, HER2+) - Signed by Truitt Merle, MD on 02/20/2022    Malignant neoplasm of overlapping sites of right breast in female, estrogen receptor positive ([place])  02/08/2022 Mammogram   CLINICAL DATA:  Acute onset right breast lump.  EXAM: DIGITAL DIAGNOSTIC UNILATERAL RIGHT MAMMOGRAM WITH TOMOSYNTHESIS AND CAD; ULTRASOUND RIGHT BREAST LIMITED  IMPRESSION: 1. Highly suspicious findings mammographically and sonographically. I suspect there is significant malignancy throughout most of the right breast. Discrete masses are seen at 11 o'clock and 6 o'clock.  A single borderline lymph node is identified. This lymph node appears prominent compared to the remainder of the lymph nodes. The skin thickening suggests the possibility of inflammatory breast cancer.   02/09/2022 Cancer Staging   Staging form: Breast, AJCC 8th Edition - Clinical stage from 02/09/2022: Stage IIIA (cT3, cN1, cM0, G2, ER+, PR-, HER2+) - Signed  by Truitt Merle, MD on 03/05/2022 Stage prefix: Initial diagnosis Histologic grading system: 3 grade system   02/09/2022 Initial Biopsy    Diagnosis 1. Breast, right, needle core biopsy, right breast 11 o'clock mass ribbon clip - INVASIVE MAMMARY CARCINOMA - SEE COMMENT 2. Breast, right, needle core biopsy, right breast 6'oclock mass, coil clip - INVASIVE MAMMARY CARCINOMA - SEE COMMENT 3. Lymph node, needle/core biopsy, right axillary lymph node, tribell clip - METASTATIC CARCINOMA INVOLVING NODAL TISSUE Microscopic Comment 1. and 2. The biopsy material shows an infiltrative proliferation of cells arranged linearly and in small clusters. Based on the biopsy, the carcinoma appears Nottingham grade 2 of 3 and measures 1.5 cm in greatest linear extent.  Addendum parts 1 and 2: Immunohistochemistry for E-cadherin is negative consistent with lobular carcinoma.  2. PROGNOSTIC INDICATORS Results: The tumor cells are EQUIVOCAL for Her2 (2+). Her2 by FISH will be performed and the results reported separately. Estrogen Receptor: 100%, POSITIVE, STRONG STAINING INTENSITY Progesterone Receptor: <1%, NEGATIVE Proliferation Marker Ki67: 10%  2. FLUORESCENCE IN-SITU HYBRIDIZATION Results: GROUP 1: HER2 **POSITIVE**   3. PROGNOSTIC INDICATORS Results: By immunohistochemistry, the tumor cells are POSITIVE for Her2 (3+). Estrogen Receptor: 100%, POSITIVE, STRONG STAINING INTENSITY Progesterone Receptor: <1%, NEGATIVE   02/16/2022 Initial Diagnosis   Malignant neoplasm of overlapping sites of right breast in female, estrogen receptor positive ([place])   03/01/2022 Imaging   EXAM: BILATERAL BREAST MRI WITH AND WITHOUT CONTRAST  IMPRESSION: 1. 8.5 x 8.3 x 5.9 cm area of confluent mass-like enhancement in the right breast involving all 4 quadrants and containing 2 biopsy marker clip artifacts, compatible with 4 quadrant biopsy-proven malignancy. 2. Biopsy-proven metastatic lymph node in the right axilla. 3. Possible metastatic intramammary lymph node in the posterior outer right breast just above the level of the nipple. 4. No  evidence of malignancy on the left.   03/01/2022 Genetic Testing   Negative hereditary cancer genetic testing: no pathogenic variants detected in [facility] CustomNext-Cancer +RNAinsight Panel.  Variant of uncertain significance reported in BRIP1 at p.F934V (c.2800T>G). Report date is March 01, 2022.    The CustomNext-Cancer+RNAinsight panel offered by [facility] includes sequencing and rearrangement analysis for the following 47 genes:  APC, ATM, AXIN2, BARD1, BMPR1A, BRCA1, BRCA2, BRIP1, CDH1, CDK4, CDKN2A, CHEK2, DICER1, EPCAM, GREM1, HOXB13, MEN1, MLH1, MSH2, MSH3, MSH6, MUTYH, NBN, NF1, NF2, NTHL1, PALB2, PMS2, POLD1, POLE, PTEN, RAD51C, RAD51D, RECQL, RET, SDHA, SDHAF2, SDHB, SDHC, SDHD, SMAD4, SMARCA4, STK11, TP53, TSC1, TSC2, and VHL.  RNA data is routinely analyzed for use in variant interpretation for all genes.   03/05/2022 Imaging   EXAM: CT CHEST, ABDOMEN, AND PELVIS WITH CONTRAST  IMPRESSION: 1. Asymmetric, masslike density of the glandular tissue of the right breast with overlying skin thickening, in keeping with known primary breast malignancy. 2. Enlarged, ill-defined right axillary lymph node containing a biopsy marking clip, consistent with known nodal metastatic disease. 3. No other evidence of lymphadenopathy or metastatic disease in the chest, abdomen, or pelvis. 4. Emphysema. Background of fine centrilobular nodularity, most concentrated in the lung apices, consistent with smoking-related respiratory bronchiolitis. 5. Aneurysm of the infrarenal abdominal aorta measuring up to 5.4 x 5.3 cm with a large burden of eccentric mural thrombus. Recommend follow-up CT/MR every 6 months and vascular consultation. This recommendation follows ACR consensus guidelines: White Paper of the ACR Incidental Findings Committee II on Vascular Findings. J Am Coll Radiol 2013; 10:789-794. 6. Coronary artery disease.   03/05/2022 Imaging   EXAM: NUCLEAR MEDICINE WHOLE BODY  BONE  SCAN  IMPRESSION: No evidence of bony metastatic disease.   03/07/2022 - 08/01/2022 Chemotherapy   Patient is on Treatment Plan : BREAST  Docetaxel + Carboplatin + Trastuzumab + Pertuzumab  (TCHP) q21d      07/26/2022 Surgery   Right mastectomy with axillary node seed guided excision converted to axillary node dissection with reconstruction by Drs. Donne Hazel and Thimmappa   07/26/2022 Pathology Results   FINAL MICROSCOPIC DIAGNOSIS:   A. LYMPH NODE, RIGHT AXILLARY TARGETED, EXCISION:  - Metastatic carcinoma involving one lymph node, 2 cm (1/1).  - Focal extranodal extension.  - Biopsy site and biopsy clip.   B. BREAST, RIGHT, MASTECTOMY:  - Invasive lobular carcinoma, 9.5 cm (ypT3).  - Carcinoma involves dermis of nipple.  - All surgical margins negative for carcinoma.  - One lymph node negative for metastatic carcinoma (0/1).  - Biopsy sites and biopsy clips.  - See oncology table.   C. LYMPH NODE, RIGHT AXILLARY, SENTINEL, EXCISION:  - One lymph node negative for metastatic carcinoma (0/1).   D. LYMPH NODE, RIGHT AXILLARY, SENTINEL, EXCISION:  - Metastatic carcinoma in one lymph node, 0.5 cm. (1/1).   E. LYMPH NODE, RIGHT AXILLARY, SENTINEL, EXCISION:  - One lymph node negative for metastatic carcinoma (0/1).    07/26/2022 Cancer Staging   Cancer Staging  Malignant neoplasm of overlapping sites of right breast in female, estrogen receptor positive ([place]) Staging form: Breast, AJCC 8th Edition - Clinical stage from 02/09/2022: Stage IIIA (cT3, cN1, cM0, G2, ER+, PR-, HER2+) - Signed by Truitt Merle, MD on 03/05/2022 Stage prefix: Initial diagnosis Histologic grading system: 3 grade system - Pathologic stage from 07/26/2022: Stage IIIA (pT3, pN1a, cM0, G2, ER+, PR-, HER2+) - Signed by Alla Feeling, NP on 08/01/2022 Histologic grading system: 3 grade system    07/26/2022 Cancer Staging   Staging form: Breast, AJCC 8th Edition - Pathologic stage from 07/26/2022: Stage IIIA (pT3,  pN1a, cM0, G2, ER+, PR-, HER2+) - Signed by Alla Feeling, NP on 08/01/2022 Histologic grading system: 3 grade system   08/24/2022 -  Chemotherapy   Patient is on Treatment Plan : BREAST ADO-Trastuzumab Emtansine (Kadcyla) q21d        INTERVAL HISTORY:  Ashley Robertson is here for a follow up of breast cancer. She was last seen by me on 08/23/22. She presents to the clinic alone. She reports she has recovered well from chemo. She notes she tolerated Kadcyla well. She reports only minimal numbness/tingling in her fingertips, which she notes is more on the left but attributes this to recent wrist fracture.   All other systems were reviewed with the patient and are negative.  MEDICAL HISTORY:  Past Medical History:  Diagnosis Date   Allergy    Lisinopril and lipitor   Anxiety and depression    Aortic atherosclerosis ([place]) 03/2020   CT Chest: 2 V (LAD & LCx) Coronary Atherosclerosis, Aortic Atherosclerosis (no aneurysm).  Mild centrilobular emphysema with mild diffuse bronchial thickening; several scattered small solitary pulmonary nodules (largest 5.6 mm in anterior left upper lobe)   Breast cancer ([place]) 12/2021   Right breast ILC   Carotid artery plaque, bilateral 10/2015   Mild to moderate plaque L>R without significant stenosis   COPD (chronic obstructive pulmonary disease) ([facility])    Coronary Artery Calcification - Score 79    Coronary Calcium Score 79.  LAD and circumflex calcification noted.  Normal ascending aorta with mild calcification.   Current every day smoker  pt quit smoking April 2023   Emphysema lung [facility])    Noted on chest CT   Family history of breast cancer 02/21/2022   Family history of prostate cancer 02/21/2022   Hyperlipidemia    Hypertension    Controlled with amlodipine   Prediabetes    Skin cancer 2021   Remove 2022    SURGICAL HISTORY: Past Surgical History:  Procedure Laterality Date   BREAST BIOPSY Right    times 3   BREAST RECONSTRUCTION WITH  PLACEMENT OF TISSUE EXPANDER AND ALLODERM Right 07/26/2022   Procedure: RIGHT BREAST RECONSTRUCTION WITH PLACEMENT OF TISSUE EXPANDER AND ALLODERM;  Surgeon: Irene Limbo, MD;  Location: [place];  Service: Plastics;  Laterality: Right;   [place]   Rhinoplasty   MASTECTOMY W/ SENTINEL NODE BIOPSY Right 07/26/2022   Procedure: RIGHT MASTECTOMY, RIGHT AXILLARY SENTINEL NODE BIOPSY;  Surgeon: Rolm Bookbinder, MD;  Location: [place];  Service: General;  Laterality: Right;  GEN & PEC BLOCK   PORTACATH PLACEMENT Left 03/06/2022   Procedure: INSERTION PORT-A-CATH;  Surgeon: Rolm Bookbinder, MD;  Location: [place];  Service: General;  Laterality: Left;   RADIOACTIVE SEED GUIDED AXILLARY SENTINEL LYMPH NODE Right 07/26/2022   Procedure: RADIOACTIVE SEED GUIDED AXILLARY SENTINEL LYMPH NODE DISSECTION;  Surgeon: Rolm Bookbinder, MD;  Location: [facility];  Service: General;  Laterality: Right;   [place] ECHOCARDIOGRAM  10/2016   EF 55 to 60%.  Normal systolic and diastolic function.  No ASD/PFO   TUBAL LIGATION  1991    I have reviewed the social history and family history with the patient and they are unchanged from previous note.  ALLERGIES:  is allergic to lisinopril and lipitor [atorvastatin].  MEDICATIONS:  Current Outpatient Medications  Medication Sig Dispense Refill   albuterol (PROVENTIL HFA) 108 (90 Base) MCG/ACT inhaler Inhale 1 to 2 puffs into the lungs every 6 hours as needed 8.5 g 2   amLODipine (NORVASC) 10 MG tablet Take 1 tablet (10 mg total) by mouth at bedtime. 90 tablet 3   aspirin 81 MG tablet Take 81 mg by mouth at bedtime.     Calcium Carb-Cholecalciferol (CALCIUM 500 + D3 PO) Take 1 tablet by mouth daily.     Cholecalciferol (VITAMIN D) 50 MCG (2000 UT) CAPS Take 2,000 Units by mouth daily.     FLUoxetine (PROZAC) 20 MG capsule Take 1 capsule by mouth daily. 90  capsule 1   Fluticasone-Umeclidin-Vilant (TRELEGY ELLIPTA) 200-62.5-25 MCG/ACT AEPB Inhale 1 puff into the lungs daily. 14 each 0   OVER THE COUNTER MEDICATION Take 1 capsule by mouth daily. Serrapeptase 120,000 units daily  Reported on 12/27/2015     rosuvastatin (CRESTOR) 5 MG tablet Take 1 tablet by mouth daily at bedtime ,except on Monday, Wednesday and Fridays take 2 tablets at bedtime as directed 180 tablet 3   Specialty Vitamins Products (MAGNESIUM, AMINO ACID CHELATE,) 133 MG tablet Take 1 tablet by mouth at bedtime. PURE magnesium supplement - 120 mg of mag per capsule     No current facility-administered medications for this visit.   Facility-Administered Medications Ordered in Other Visits  Medication Dose Route Frequency Provider Last Rate Last Admin   acetaminophen (TYLENOL) tablet 650 mg  650 mg Oral Once Truitt Merle, MD       ado-trastuzumab emtansine [facility]) 260 mg in sodium chloride 0.9 % 250 mL chemo infusion  3.6 mg/kg (Treatment Plan Recorded) Intravenous Once Truitt Merle, MD       diphenhydrAMINE (BENADRYL) capsule 50 mg  50 mg Oral Once Truitt Merle, MD       prochlorperazine (COMPAZINE) tablet 10 mg  10 mg Oral Once Truitt Merle, MD        PHYSICAL EXAMINATION: ECOG PERFORMANCE STATUS: 0 - Asymptomatic  Vitals:   09/13/22 0819  BP: (!) 143/82  Pulse: 62  Resp: 19  Temp: 97.6 F (36.4 C)  SpO2: 96%   Wt Readings from Last 3 Encounters:  09/13/22 159 lb 4.8 oz (72.3 kg)  08/30/22 162 lb 12.8 oz (73.8 kg)  08/23/22 160 lb 9.6 oz (72.8 kg)     GENERAL:alert, no distress and comfortable SKIN: skin color normal, no rashes or significant lesions EYES: normal, Conjunctiva are pink and non-injected, sclera clear  NEURO: alert & oriented x 3 with fluent speech  LABORATORY DATA:  I have reviewed the data as listed    Latest Ref Rng & Units 09/13/2022    7:34 AM 08/23/2022   11:35 AM 08/01/2022    9:47 AM  CBC  WBC 4.0 - 10.5 K/uL 4.1  4.5  5.9   Hemoglobin 12.0 - 15.0  g/dL 13.5  12.7  12.0   Hematocrit 36.0 - 46.0 % 40.0  38.4  35.7   Platelets 150 - 400 K/uL 227  280  228         Latest Ref Rng & Units 09/13/2022    7:34 AM 08/23/2022   11:35 AM 08/01/2022    9:47 AM  CMP  Glucose 70 - 99 mg/dL 115  110  114   BUN 8 - 23 mg/dL _0 Creatinine 0.44 - 1.00 mg/dL 0.70  0.64  0.57   Sodium 135 - 145 mmol/L 135  136  136   Potassium 3.5 - 5.1 mmol/L 3.9  4.3  3.9   Chloride 98 - 111 mmol/L 101  103  104   CO2 22 - 32 mmol/L _1 Calcium 8.9 - 10.3 mg/dL 8.9  9.0  9.4   Total Protein 6.5 - 8.1 g/dL 7.1  6.8  6.5   Total Bilirubin 0.3 - 1.2 mg/dL 0.4  0.4  0.4   Alkaline Phos 38 - 126 U/L 106  110  96   AST 15 - 41 U/L 35  19  16   ALT 0 - 44 U/L _2 RADIOGRAPHIC STUDIES: I have personally reviewed the radiological images as listed and agreed with the findings in the report. No results found.    Orders Placed This Encounter  Procedures   CBC with Differential ([facility] Only)    Standing Status:   Future    Standing Expiration Date:   10/25/2023   CMP ([place] only)    Standing Status:   Future    Standing Expiration Date:   10/25/2023   CBC with Differential ([facility] Only)    Standing Status:   Future    Standing Expiration Date:   11/15/2023   CMP ([place] only)    Standing Status:   Future    Standing Expiration Date:   11/15/2023   ECHOCARDIOGRAM COMPLETE    Standing Status:   Future    Standing Expiration Date:   09/14/2023    Order Specific Question:   Where should this test be  performed    Answer:   [facility]    Order Specific Question:   Perflutren DEFINITY (image enhancing agent) should be administered unless hypersensitivity or allergy exist    Answer:   Administer Perflutren    Order Specific Question:   Is a special reader required? (athlete or structural heart)    Answer:   No    Order Specific Question:   Does this study need to be read by the Structural team/Level  3 readers?    Answer:   No    Order Specific Question:   Reason for exam-Echo    Answer:   Chemo  Z09   All questions were answered. The patient knows to call the clinic with any problems, questions or concerns. No barriers to learning was detected. The total time spent in the appointment was 30 minutes.     Truitt Merle, MD 09/13/2022   I, Wilburn Mylar, am acting as scribe for Truitt Merle, MD.   I have reviewed the above documentation for accuracy and completeness, and I agree with the above.

## 2022-09-13 NOTE — Patient Instructions (Signed)
[facility] MEDICAL ONCOLOGY  Discharge Instructions: Thank you for choosing [facility] to provide your oncology and hematology care.   If you have a lab appointment with the Cancer Center, please go directly to the Cancer Center and check in at the registration area.   Wear comfortable clothing and clothing appropriate for easy access to any Portacath or PICC line.   We strive to give you quality time with your provider. You may need to reschedule your appointment if you arrive late (15 or more minutes).  Arriving late affects you and other patients whose appointments are after yours.  Also, if you miss three or more appointments without notifying the office, you may be dismissed from the clinic at the provider's discretion.      For prescription refill requests, have your pharmacy contact our office and allow 72 hours for refills to be completed.    Today you received the following chemotherapy and/or immunotherapy agents Kadcyla      To help prevent nausea and vomiting after your treatment, we encourage you to take your nausea medication as directed.  BELOW ARE SYMPTOMS THAT SHOULD BE REPORTED IMMEDIATELY: *FEVER GREATER THAN 100.4 F (38 C) OR HIGHER *CHILLS OR SWEATING *NAUSEA AND VOMITING THAT IS NOT CONTROLLED WITH YOUR NAUSEA MEDICATION *UNUSUAL SHORTNESS OF BREATH *UNUSUAL BRUISING OR BLEEDING *URINARY PROBLEMS (pain or burning when urinating, or frequent urination) *BOWEL PROBLEMS (unusual diarrhea, constipation, pain near the anus) TENDERNESS IN MOUTH AND THROAT WITH OR WITHOUT PRESENCE OF ULCERS (sore throat, sores in mouth, or a toothache) UNUSUAL RASH, SWELLING OR PAIN  UNUSUAL VAGINAL DISCHARGE OR ITCHING   Items with * indicate a potential emergency and should be followed up as soon as possible or go to the Emergency Department if any problems should occur.  Please show the CHEMOTHERAPY ALERT CARD or IMMUNOTHERAPY ALERT CARD at check-in to the  Emergency Department and triage nurse.  Should you have questions after your visit or need to cancel or reschedule your appointment, please contact [facility]  Dept: [phone]  and follow the prompts.  Office hours are 8:00 a.m. to 4:30 p.m. Monday - Friday. Please note that voicemails left after 4:00 p.m. may not be returned until the following business day.  We are closed weekends and major holidays. You have access to a nurse at all times for urgent questions. Please call the main number to the clinic Dept: [phone] and follow the prompts.   For any non-urgent questions, you may also contact your provider using MyChart. We now offer e-Visits for anyone 18 and older to request care online for non-urgent symptoms. For details visit [email or web address].   Also download the MyChart app! Go to the app store, search "MyChart", open the app, select [facility], and log in with your MyChart username and password.  Masks are optional in the cancer centers. If you would like for your care team to wear a mask while they are taking care of you, please let them know. You may have one support person who is at least 67 years old accompany you for your appointments.  Ado-Trastuzumab Emtansine Injection What is this medication? ADO-TRASTUZUMAB EMTANSINE (ADD oh traz TOO zuh mab em TAN zine) treats breast cancer. It works by blocking a protein that causes cancer cells to grow and multiply. This helps to slow or stop the spread of cancer cells. This medicine may be used for other purposes; ask your health care provider or pharmacist   if you have questions. COMMON BRAND NAME(S): Kadcyla What should I tell my care team before I take this medication? They need to know if you have any of these conditions: Heart failure Liver disease Low platelet levels Lung disease Tingling of the fingers or toes or other nerve disorder An unusual or allergic reaction to ado-trastuzumab  emtansine, other medications, foods, dyes, or preservatives Pregnant or trying to get pregnant Breast-feeding How should I use this medication? This medication is infused into a vein. It is given by your care team in a hospital or clinic setting. Talk to your care team about the use of this medication in children. Special care may be needed. Overdosage: If you think you have taken too much of this medicine contact a poison control center or emergency room at once. NOTE: This medicine is only for you. Do not share this medicine with others. What if I miss a dose? Keep appointments for follow-up doses. It is important not to miss your dose. Call your care team if you are unable to keep an appointment. What may interact with this medication? Atazanavir Boceprevir Clarithromycin Dalfopristin; quinupristin Delavirdine Indinavir Isoniazid, INH Itraconazole Ketoconazole Nefazodone Nelfinavir Ritonavir Telaprevir Telithromycin Tipranavir Voriconazole This list may not describe all possible interactions. Give your health care provider a list of all the medicines, herbs, non-prescription drugs, or dietary supplements you use. Also tell them if you smoke, drink alcohol, or use illegal drugs. Some items may interact with your medicine. What should I watch for while using this medication? This medication may make you feel generally unwell. This is not uncommon, as chemotherapy can affect healthy cells as well as cancer cells. Report any side effects. Continue your course of treatment even though you feel ill unless your care team tells you to stop. You may need blood work while taking this medication. This medication may increase your risk to bruise or bleed. Call your care team if you notice any unusual bleeding. Be careful brushing or flossing your teeth or using a toothpick because you may get an infection or bleed more easily. If you have any dental work done, tell your dentist you are  receiving this medication. Talk to your care team if you may be pregnant. Serious birth defects can occur if you take this medication during pregnancy and for 7 months after the last dose. You will need a negative pregnancy test before starting this medication. Contraception is recommended while taking this medication and for 7 months after the last dose. Your care team can help you find the option that works for you. If your partner can get pregnant, use a condom during sex while taking this medication and for 4 months after the last dose. Do not breastfeed while taking this medication and for 7 months after the last dose. This medication may cause infertility. Talk to your care team if you are concerned with your fertility. What side effects may I notice from receiving this medication? Side effects that you should report to your care team as soon as possible: Allergic reactions--skin rash, itching, hives, swelling of the face, lips, tongue, or throat Bleeding--bloody or black, tar-like stools, vomiting blood or brown material that looks like coffee grounds, red or dark brown urine, small red or purple spots on skin, unusual bruising or bleeding Dry cough, shortness of breath or trouble breathing Heart failure--shortness of breath, swelling of the ankles, feet, or hands, sudden weight gain, unusual weakness or fatigue Infusion reactions--chest pain, shortness of breath or trouble breathing,   feeling faint or lightheaded Liver injury--right upper belly pain, loss of appetite, nausea, light-colored stool, dark yellow or brown urine, yellowing skin or eyes, unusual weakness or fatigue Pain, tingling, or numbness in the hands or feet Painful swelling, warmth, or redness of the skin, blisters or sores at the infusion site Side effects that usually do not require medical attention (report to your care team if they continue or are bothersome): Constipation Fatigue Headache Muscle pain Nausea This list  may not describe all possible side effects. Call your doctor for medical advice about side effects. You may report side effects to FDA at 1-800-FDA-1088. Where should I keep my medication? This medication is given in a hospital or clinic. It will not be stored at home. NOTE: This sheet is a summary. It may not cover all possible information. If you have questions about this medicine, talk to your doctor, pharmacist, or health care provider.  2023 Elsevier/Gold Standard (2022-04-06 00:00:00) 

## 2022-09-13 NOTE — Research (Signed)
Trial:  DCP-001: Use of a Clinical Trial Screening Tool to Address Cancer Health Disparities in the [facility] [place])  Patient Ashley Robertson was identified by this research nurse as a potential candidate for the above listed study.  This Clinical Research Nurse met with [name], [id number], on 09/13/22 in a manner and location that ensures patient privacy to discuss participation in the above listed research study.  Patient is Unaccompanied.  Briefly explained this study as a one time data collection for patients screened on NCI trials.  Patient stated she was not interested in participating.  Thanked patient for her time and for speaking with research nurse today.  Foye Spurling, BSN, RN, [place] Nurse II [phone] 09/13/2022

## 2022-09-14 ENCOUNTER — Other Ambulatory Visit: Payer: Self-pay

## 2022-09-14 ENCOUNTER — Ambulatory Visit
Admission: RE | Admit: 2022-09-14 | Discharge: 2022-09-14 | Disposition: A | Discharge status: Home / Self Care | Payer: PPO | Source: Ambulatory Visit | Attending: Radiation Oncology | Admitting: Radiation Oncology

## 2022-09-14 DIAGNOSIS — C50811 Malignant neoplasm of overlapping sites of right female breast: Secondary | ICD-10-CM | POA: Diagnosis not present

## 2022-09-14 DIAGNOSIS — Z170 Estrogen receptor positive status [ER+]: Secondary | ICD-10-CM | POA: Diagnosis not present

## 2022-09-14 DIAGNOSIS — Z510 Encounter for antineoplastic radiation therapy: Secondary | ICD-10-CM | POA: Diagnosis not present

## 2022-09-14 LAB — RAD ONC ARIA SESSION SUMMARY
Course Elapsed Days: 3
Plan Fractions Treated to Date: 2
Plan Fractions Treated to Date: 4
Plan Prescribed Dose Per Fraction: 1.8 Gy
Plan Prescribed Dose Per Fraction: 1.8 Gy
Plan Total Fractions Prescribed: 14
Plan Total Fractions Prescribed: 28
Plan Total Prescribed Dose: 25.2 Gy
Plan Total Prescribed Dose: 50.4 Gy
Reference Point Dosage Given to Date: 3.6 Gy
Reference Point Dosage Given to Date: 7.2 Gy
Reference Point Session Dosage Given: 1.8 Gy
Reference Point Session Dosage Given: 1.8 Gy
Session Number: 4

## 2022-09-17 ENCOUNTER — Ambulatory Visit
Admission: RE | Admit: 2022-09-17 | Discharge: 2022-09-17 | Disposition: A | Discharge status: Home / Self Care | Payer: PPO | Source: Ambulatory Visit | Attending: Radiation Oncology | Admitting: Radiation Oncology

## 2022-09-17 ENCOUNTER — Other Ambulatory Visit: Payer: Self-pay

## 2022-09-17 DIAGNOSIS — C50811 Malignant neoplasm of overlapping sites of right female breast: Secondary | ICD-10-CM | POA: Diagnosis not present

## 2022-09-17 DIAGNOSIS — Z170 Estrogen receptor positive status [ER+]: Secondary | ICD-10-CM | POA: Diagnosis not present

## 2022-09-17 DIAGNOSIS — Z510 Encounter for antineoplastic radiation therapy: Secondary | ICD-10-CM | POA: Diagnosis not present

## 2022-09-17 LAB — RAD ONC ARIA SESSION SUMMARY
Course Elapsed Days: 6
Plan Fractions Treated to Date: 3
Plan Fractions Treated to Date: 5
Plan Prescribed Dose Per Fraction: 1.8 Gy
Plan Prescribed Dose Per Fraction: 1.8 Gy
Plan Total Fractions Prescribed: 14
Plan Total Fractions Prescribed: 28
Plan Total Prescribed Dose: 25.2 Gy
Plan Total Prescribed Dose: 50.4 Gy
Reference Point Dosage Given to Date: 5.4 Gy
Reference Point Dosage Given to Date: 9 Gy
Reference Point Session Dosage Given: 1.8 Gy
Reference Point Session Dosage Given: 1.8 Gy
Session Number: 5

## 2022-09-18 ENCOUNTER — Ambulatory Visit
Admission: RE | Admit: 2022-09-18 | Discharge: 2022-09-18 | Disposition: A | Discharge status: Home / Self Care | Payer: PPO | Source: Ambulatory Visit | Attending: Radiation Oncology | Admitting: Radiation Oncology

## 2022-09-18 ENCOUNTER — Other Ambulatory Visit: Payer: Self-pay

## 2022-09-18 DIAGNOSIS — C50811 Malignant neoplasm of overlapping sites of right female breast: Secondary | ICD-10-CM | POA: Diagnosis not present

## 2022-09-18 DIAGNOSIS — Z170 Estrogen receptor positive status [ER+]: Secondary | ICD-10-CM | POA: Diagnosis not present

## 2022-09-18 DIAGNOSIS — Z510 Encounter for antineoplastic radiation therapy: Secondary | ICD-10-CM | POA: Diagnosis not present

## 2022-09-18 LAB — RAD ONC ARIA SESSION SUMMARY
Course Elapsed Days: 7
Plan Fractions Treated to Date: 3
Plan Fractions Treated to Date: 6
Plan Prescribed Dose Per Fraction: 1.8 Gy
Plan Prescribed Dose Per Fraction: 1.8 Gy
Plan Total Fractions Prescribed: 14
Plan Total Fractions Prescribed: 28
Plan Total Prescribed Dose: 25.2 Gy
Plan Total Prescribed Dose: 50.4 Gy
Reference Point Dosage Given to Date: 10.8 Gy
Reference Point Dosage Given to Date: 5.4 Gy
Reference Point Session Dosage Given: 1.8 Gy
Reference Point Session Dosage Given: 1.8 Gy
Session Number: 6

## 2022-09-19 ENCOUNTER — Ambulatory Visit
Admission: RE | Admit: 2022-09-19 | Discharge: 2022-09-19 | Disposition: A | Discharge status: Home / Self Care | Payer: PPO | Source: Ambulatory Visit | Attending: Radiation Oncology | Admitting: Radiation Oncology

## 2022-09-19 ENCOUNTER — Other Ambulatory Visit: Payer: Self-pay

## 2022-09-19 ENCOUNTER — Ambulatory Visit (HOSPITAL_COMMUNITY)
Admission: RE | Admit: 2022-09-19 | Discharge: 2022-09-19 | Disposition: A | Discharge status: Home / Self Care | Payer: PPO | Source: Ambulatory Visit | Attending: Hematology | Admitting: Hematology

## 2022-09-19 DIAGNOSIS — Z5111 Encounter for antineoplastic chemotherapy: Secondary | ICD-10-CM | POA: Insufficient documentation

## 2022-09-19 DIAGNOSIS — C773 Secondary and unspecified malignant neoplasm of axilla and upper limb lymph nodes: Secondary | ICD-10-CM | POA: Diagnosis not present

## 2022-09-19 DIAGNOSIS — Z510 Encounter for antineoplastic radiation therapy: Secondary | ICD-10-CM | POA: Diagnosis not present

## 2022-09-19 DIAGNOSIS — Z0189 Encounter for other specified special examinations: Secondary | ICD-10-CM

## 2022-09-19 DIAGNOSIS — Z170 Estrogen receptor positive status [ER+]: Secondary | ICD-10-CM

## 2022-09-19 DIAGNOSIS — I2510 Atherosclerotic heart disease of native coronary artery without angina pectoris: Secondary | ICD-10-CM | POA: Diagnosis not present

## 2022-09-19 DIAGNOSIS — Z9011 Acquired absence of right breast and nipple: Secondary | ICD-10-CM | POA: Diagnosis not present

## 2022-09-19 DIAGNOSIS — C50811 Malignant neoplasm of overlapping sites of right female breast: Secondary | ICD-10-CM | POA: Insufficient documentation

## 2022-09-19 LAB — ECHOCARDIOGRAM COMPLETE
AV Mean grad: 5.5 mmHg
AV Peak grad: 11.2 mmHg
Ao pk vel: 1.68 m/s
Calc EF: 57.4 %
MV M vel: 2.23 m/s
MV Peak grad: 19.8 mmHg
S' Lateral: 3.8 cm
Single Plane A2C EF: 46 %
Single Plane A4C EF: 65.5 %

## 2022-09-19 LAB — RAD ONC ARIA SESSION SUMMARY
Course Elapsed Days: 8
Plan Fractions Treated to Date: 4
Plan Fractions Treated to Date: 7
Plan Prescribed Dose Per Fraction: 1.8 Gy
Plan Prescribed Dose Per Fraction: 1.8 Gy
Plan Total Fractions Prescribed: 14
Plan Total Fractions Prescribed: 28
Plan Total Prescribed Dose: 25.2 Gy
Plan Total Prescribed Dose: 50.4 Gy
Reference Point Dosage Given to Date: 12.6 Gy
Reference Point Dosage Given to Date: 7.2 Gy
Reference Point Session Dosage Given: 1.8 Gy
Reference Point Session Dosage Given: 1.8 Gy
Session Number: 7

## 2022-09-19 NOTE — Progress Notes (Signed)
  Echocardiogram 2D Echocardiogram has been performed.  Ashley Robertson 09/19/2022, 9:37 AM

## 2022-09-20 ENCOUNTER — Other Ambulatory Visit: Payer: Self-pay

## 2022-09-20 ENCOUNTER — Ambulatory Visit
Admission: RE | Admit: 2022-09-20 | Discharge: 2022-09-20 | Disposition: A | Discharge status: Home / Self Care | Payer: PPO | Source: Ambulatory Visit | Attending: Radiation Oncology | Admitting: Radiation Oncology

## 2022-09-20 DIAGNOSIS — Z510 Encounter for antineoplastic radiation therapy: Secondary | ICD-10-CM | POA: Diagnosis not present

## 2022-09-20 DIAGNOSIS — M6281 Muscle weakness (generalized): Secondary | ICD-10-CM | POA: Diagnosis not present

## 2022-09-20 DIAGNOSIS — M25632 Stiffness of left wrist, not elsewhere classified: Secondary | ICD-10-CM | POA: Diagnosis not present

## 2022-09-20 DIAGNOSIS — M25532 Pain in left wrist: Secondary | ICD-10-CM | POA: Diagnosis not present

## 2022-09-20 DIAGNOSIS — Z170 Estrogen receptor positive status [ER+]: Secondary | ICD-10-CM | POA: Diagnosis not present

## 2022-09-20 DIAGNOSIS — S52592S Other fractures of lower end of left radius, sequela: Secondary | ICD-10-CM | POA: Diagnosis not present

## 2022-09-20 DIAGNOSIS — C50811 Malignant neoplasm of overlapping sites of right female breast: Secondary | ICD-10-CM | POA: Diagnosis not present

## 2022-09-20 LAB — RAD ONC ARIA SESSION SUMMARY
Course Elapsed Days: 9
Plan Fractions Treated to Date: 4
Plan Fractions Treated to Date: 8
Plan Prescribed Dose Per Fraction: 1.8 Gy
Plan Prescribed Dose Per Fraction: 1.8 Gy
Plan Total Fractions Prescribed: 14
Plan Total Fractions Prescribed: 28
Plan Total Prescribed Dose: 25.2 Gy
Plan Total Prescribed Dose: 50.4 Gy
Reference Point Dosage Given to Date: 14.4 Gy
Reference Point Dosage Given to Date: 7.2 Gy
Reference Point Session Dosage Given: 1.8 Gy
Reference Point Session Dosage Given: 1.8 Gy
Session Number: 8

## 2022-09-21 ENCOUNTER — Ambulatory Visit
Admission: RE | Admit: 2022-09-21 | Discharge: 2022-09-21 | Disposition: A | Discharge status: Home / Self Care | Payer: PPO | Source: Ambulatory Visit | Attending: Radiation Oncology | Admitting: Radiation Oncology

## 2022-09-21 ENCOUNTER — Other Ambulatory Visit: Payer: Self-pay

## 2022-09-21 DIAGNOSIS — Z510 Encounter for antineoplastic radiation therapy: Secondary | ICD-10-CM | POA: Diagnosis not present

## 2022-09-21 DIAGNOSIS — Z170 Estrogen receptor positive status [ER+]: Secondary | ICD-10-CM | POA: Diagnosis not present

## 2022-09-21 DIAGNOSIS — C50811 Malignant neoplasm of overlapping sites of right female breast: Secondary | ICD-10-CM | POA: Diagnosis not present

## 2022-09-21 LAB — RAD ONC ARIA SESSION SUMMARY
Course Elapsed Days: 10
Plan Fractions Treated to Date: 5
Plan Fractions Treated to Date: 9
Plan Prescribed Dose Per Fraction: 1.8 Gy
Plan Prescribed Dose Per Fraction: 1.8 Gy
Plan Total Fractions Prescribed: 14
Plan Total Fractions Prescribed: 28
Plan Total Prescribed Dose: 25.2 Gy
Plan Total Prescribed Dose: 50.4 Gy
Reference Point Dosage Given to Date: 16.2 Gy
Reference Point Dosage Given to Date: 9 Gy
Reference Point Session Dosage Given: 1.8 Gy
Reference Point Session Dosage Given: 1.8 Gy
Session Number: 9

## 2022-09-24 ENCOUNTER — Other Ambulatory Visit: Payer: Self-pay

## 2022-09-24 ENCOUNTER — Ambulatory Visit
Admission: RE | Admit: 2022-09-24 | Discharge: 2022-09-24 | Disposition: A | Discharge status: Home / Self Care | Payer: PPO | Source: Ambulatory Visit | Attending: Radiation Oncology | Admitting: Radiation Oncology

## 2022-09-24 DIAGNOSIS — S52592S Other fractures of lower end of left radius, sequela: Secondary | ICD-10-CM | POA: Diagnosis not present

## 2022-09-24 DIAGNOSIS — C50811 Malignant neoplasm of overlapping sites of right female breast: Secondary | ICD-10-CM | POA: Diagnosis not present

## 2022-09-24 DIAGNOSIS — M6281 Muscle weakness (generalized): Secondary | ICD-10-CM | POA: Diagnosis not present

## 2022-09-24 DIAGNOSIS — Z510 Encounter for antineoplastic radiation therapy: Secondary | ICD-10-CM | POA: Diagnosis not present

## 2022-09-24 DIAGNOSIS — M25532 Pain in left wrist: Secondary | ICD-10-CM | POA: Diagnosis not present

## 2022-09-24 DIAGNOSIS — Z170 Estrogen receptor positive status [ER+]: Secondary | ICD-10-CM | POA: Diagnosis not present

## 2022-09-24 DIAGNOSIS — M25632 Stiffness of left wrist, not elsewhere classified: Secondary | ICD-10-CM | POA: Diagnosis not present

## 2022-09-24 LAB — RAD ONC ARIA SESSION SUMMARY
Course Elapsed Days: 13
Plan Fractions Treated to Date: 10
Plan Fractions Treated to Date: 5
Plan Prescribed Dose Per Fraction: 1.8 Gy
Plan Prescribed Dose Per Fraction: 1.8 Gy
Plan Total Fractions Prescribed: 14
Plan Total Fractions Prescribed: 28
Plan Total Prescribed Dose: 25.2 Gy
Plan Total Prescribed Dose: 50.4 Gy
Reference Point Dosage Given to Date: 18 Gy
Reference Point Dosage Given to Date: 9 Gy
Reference Point Session Dosage Given: 1.8 Gy
Reference Point Session Dosage Given: 1.8 Gy
Session Number: 10

## 2022-09-25 ENCOUNTER — Ambulatory Visit
Admission: RE | Admit: 2022-09-25 | Discharge: 2022-09-25 | Disposition: A | Discharge status: Home / Self Care | Payer: PPO | Source: Ambulatory Visit | Attending: Radiation Oncology | Admitting: Radiation Oncology

## 2022-09-25 ENCOUNTER — Other Ambulatory Visit: Payer: Self-pay

## 2022-09-25 DIAGNOSIS — C50811 Malignant neoplasm of overlapping sites of right female breast: Secondary | ICD-10-CM | POA: Diagnosis not present

## 2022-09-25 DIAGNOSIS — Z170 Estrogen receptor positive status [ER+]: Secondary | ICD-10-CM | POA: Diagnosis not present

## 2022-09-25 DIAGNOSIS — Z510 Encounter for antineoplastic radiation therapy: Secondary | ICD-10-CM | POA: Diagnosis not present

## 2022-09-25 LAB — RAD ONC ARIA SESSION SUMMARY
Course Elapsed Days: 14
Plan Fractions Treated to Date: 11
Plan Fractions Treated to Date: 6
Plan Prescribed Dose Per Fraction: 1.8 Gy
Plan Prescribed Dose Per Fraction: 1.8 Gy
Plan Total Fractions Prescribed: 14
Plan Total Fractions Prescribed: 28
Plan Total Prescribed Dose: 25.2 Gy
Plan Total Prescribed Dose: 50.4 Gy
Reference Point Dosage Given to Date: 10.8 Gy
Reference Point Dosage Given to Date: 19.8 Gy
Reference Point Session Dosage Given: 1.8 Gy
Reference Point Session Dosage Given: 1.8 Gy
Session Number: 11

## 2022-09-26 ENCOUNTER — Other Ambulatory Visit: Payer: Self-pay

## 2022-09-26 ENCOUNTER — Ambulatory Visit
Admission: RE | Admit: 2022-09-26 | Discharge: 2022-09-26 | Disposition: A | Discharge status: Home / Self Care | Payer: PPO | Source: Ambulatory Visit | Attending: Radiation Oncology | Admitting: Radiation Oncology

## 2022-09-26 DIAGNOSIS — Z510 Encounter for antineoplastic radiation therapy: Secondary | ICD-10-CM | POA: Diagnosis not present

## 2022-09-26 DIAGNOSIS — C50811 Malignant neoplasm of overlapping sites of right female breast: Secondary | ICD-10-CM | POA: Diagnosis not present

## 2022-09-26 DIAGNOSIS — Z170 Estrogen receptor positive status [ER+]: Secondary | ICD-10-CM | POA: Diagnosis not present

## 2022-09-26 LAB — RAD ONC ARIA SESSION SUMMARY
Course Elapsed Days: 15
Plan Fractions Treated to Date: 12
Plan Fractions Treated to Date: 6
Plan Prescribed Dose Per Fraction: 1.8 Gy
Plan Prescribed Dose Per Fraction: 1.8 Gy
Plan Total Fractions Prescribed: 14
Plan Total Fractions Prescribed: 28
Plan Total Prescribed Dose: 25.2 Gy
Plan Total Prescribed Dose: 50.4 Gy
Reference Point Dosage Given to Date: 10.8 Gy
Reference Point Dosage Given to Date: 21.6 Gy
Reference Point Session Dosage Given: 1.8 Gy
Reference Point Session Dosage Given: 1.8 Gy
Session Number: 12

## 2022-09-27 ENCOUNTER — Ambulatory Visit
Admission: RE | Admit: 2022-09-27 | Discharge: 2022-09-27 | Disposition: A | Discharge status: Home / Self Care | Payer: PPO | Source: Ambulatory Visit | Attending: Radiation Oncology | Admitting: Radiation Oncology

## 2022-09-27 ENCOUNTER — Other Ambulatory Visit: Payer: Self-pay

## 2022-09-27 DIAGNOSIS — Z510 Encounter for antineoplastic radiation therapy: Secondary | ICD-10-CM | POA: Diagnosis not present

## 2022-09-27 DIAGNOSIS — Z170 Estrogen receptor positive status [ER+]: Secondary | ICD-10-CM | POA: Diagnosis not present

## 2022-09-27 DIAGNOSIS — C50811 Malignant neoplasm of overlapping sites of right female breast: Secondary | ICD-10-CM | POA: Diagnosis not present

## 2022-09-27 LAB — RAD ONC ARIA SESSION SUMMARY
Course Elapsed Days: 16
Plan Fractions Treated to Date: 13
Plan Fractions Treated to Date: 7
Plan Prescribed Dose Per Fraction: 1.8 Gy
Plan Prescribed Dose Per Fraction: 1.8 Gy
Plan Total Fractions Prescribed: 14
Plan Total Fractions Prescribed: 28
Plan Total Prescribed Dose: 25.2 Gy
Plan Total Prescribed Dose: 50.4 Gy
Reference Point Dosage Given to Date: 12.6 Gy
Reference Point Dosage Given to Date: 23.4 Gy
Reference Point Session Dosage Given: 1.8 Gy
Reference Point Session Dosage Given: 1.8 Gy
Session Number: 13

## 2022-09-28 ENCOUNTER — Other Ambulatory Visit: Payer: Self-pay

## 2022-09-28 ENCOUNTER — Ambulatory Visit
Admission: RE | Admit: 2022-09-28 | Discharge: 2022-09-28 | Disposition: A | Discharge status: Home / Self Care | Payer: PPO | Source: Ambulatory Visit | Attending: Radiation Oncology | Admitting: Radiation Oncology

## 2022-09-28 DIAGNOSIS — Z510 Encounter for antineoplastic radiation therapy: Secondary | ICD-10-CM | POA: Diagnosis not present

## 2022-09-28 DIAGNOSIS — C50811 Malignant neoplasm of overlapping sites of right female breast: Secondary | ICD-10-CM | POA: Diagnosis not present

## 2022-09-28 DIAGNOSIS — Z170 Estrogen receptor positive status [ER+]: Secondary | ICD-10-CM | POA: Diagnosis not present

## 2022-09-28 LAB — RAD ONC ARIA SESSION SUMMARY
Course Elapsed Days: 17
Plan Fractions Treated to Date: 14
Plan Fractions Treated to Date: 7
Plan Prescribed Dose Per Fraction: 1.8 Gy
Plan Prescribed Dose Per Fraction: 1.8 Gy
Plan Total Fractions Prescribed: 14
Plan Total Fractions Prescribed: 28
Plan Total Prescribed Dose: 25.2 Gy
Plan Total Prescribed Dose: 50.4 Gy
Reference Point Dosage Given to Date: 12.6 Gy
Reference Point Dosage Given to Date: 25.2 Gy
Reference Point Session Dosage Given: 1.8 Gy
Reference Point Session Dosage Given: 1.8 Gy
Session Number: 14

## 2022-09-28 MED ORDER — RADIAPLEXRX EX GEL: Freq: Once | CUTANEOUS | Status: AC

## 2022-10-01 ENCOUNTER — Other Ambulatory Visit: Payer: Self-pay

## 2022-10-01 ENCOUNTER — Other Ambulatory Visit: Payer: Self-pay | Admitting: Pulmonary Disease

## 2022-10-01 ENCOUNTER — Ambulatory Visit
Admission: RE | Admit: 2022-10-01 | Discharge: 2022-10-01 | Disposition: A | Discharge status: Home / Self Care | Payer: PPO | Source: Ambulatory Visit | Attending: Radiation Oncology | Admitting: Radiation Oncology

## 2022-10-01 ENCOUNTER — Other Ambulatory Visit (HOSPITAL_COMMUNITY): Payer: Self-pay

## 2022-10-01 DIAGNOSIS — Z510 Encounter for antineoplastic radiation therapy: Secondary | ICD-10-CM | POA: Diagnosis not present

## 2022-10-01 DIAGNOSIS — Z170 Estrogen receptor positive status [ER+]: Secondary | ICD-10-CM | POA: Diagnosis not present

## 2022-10-01 DIAGNOSIS — C50811 Malignant neoplasm of overlapping sites of right female breast: Secondary | ICD-10-CM | POA: Diagnosis not present

## 2022-10-01 LAB — RAD ONC ARIA SESSION SUMMARY
Course Elapsed Days: 20
Plan Fractions Treated to Date: 15
Plan Fractions Treated to Date: 8
Plan Prescribed Dose Per Fraction: 1.8 Gy
Plan Prescribed Dose Per Fraction: 1.8 Gy
Plan Total Fractions Prescribed: 14
Plan Total Fractions Prescribed: 28
Plan Total Prescribed Dose: 25.2 Gy
Plan Total Prescribed Dose: 50.4 Gy
Reference Point Dosage Given to Date: 14.4 Gy
Reference Point Dosage Given to Date: 27 Gy
Reference Point Session Dosage Given: 1.8 Gy
Reference Point Session Dosage Given: 1.8 Gy
Session Number: 15

## 2022-10-01 MED ORDER — TRELEGY ELLIPTA 200-62.5-25 MCG/ACT IN AEPB
1.0000 | INHALATION_SPRAY | Freq: Every day | RESPIRATORY_TRACT | 2 refills | Status: DC
  Filled 2022-10-01: qty 60, 30d supply, fill #0
  Filled 2022-10-31: qty 60, 30d supply, fill #1
  Filled 2022-12-04: qty 60, 30d supply, fill #2

## 2022-10-02 ENCOUNTER — Other Ambulatory Visit: Payer: Self-pay

## 2022-10-02 ENCOUNTER — Ambulatory Visit
Admission: RE | Admit: 2022-10-02 | Discharge: 2022-10-02 | Disposition: A | Discharge status: Home / Self Care | Payer: PPO | Source: Ambulatory Visit | Attending: Radiation Oncology | Admitting: Radiation Oncology

## 2022-10-02 DIAGNOSIS — Z170 Estrogen receptor positive status [ER+]: Secondary | ICD-10-CM | POA: Diagnosis not present

## 2022-10-02 DIAGNOSIS — C50811 Malignant neoplasm of overlapping sites of right female breast: Secondary | ICD-10-CM | POA: Diagnosis not present

## 2022-10-02 DIAGNOSIS — Z510 Encounter for antineoplastic radiation therapy: Secondary | ICD-10-CM | POA: Diagnosis not present

## 2022-10-02 LAB — RAD ONC ARIA SESSION SUMMARY
Course Elapsed Days: 21
Plan Fractions Treated to Date: 16
Plan Fractions Treated to Date: 8
Plan Prescribed Dose Per Fraction: 1.8 Gy
Plan Prescribed Dose Per Fraction: 1.8 Gy
Plan Total Fractions Prescribed: 14
Plan Total Fractions Prescribed: 28
Plan Total Prescribed Dose: 25.2 Gy
Plan Total Prescribed Dose: 50.4 Gy
Reference Point Dosage Given to Date: 14.4 Gy
Reference Point Dosage Given to Date: 28.8 Gy
Reference Point Session Dosage Given: 1.8 Gy
Reference Point Session Dosage Given: 1.8 Gy
Session Number: 16

## 2022-10-03 ENCOUNTER — Inpatient Hospital Stay: Payer: PPO

## 2022-10-03 ENCOUNTER — Other Ambulatory Visit: Payer: Self-pay

## 2022-10-03 ENCOUNTER — Ambulatory Visit
Admission: RE | Admit: 2022-10-03 | Discharge: 2022-10-03 | Disposition: A | Discharge status: Home / Self Care | Payer: PPO | Source: Ambulatory Visit | Attending: Radiation Oncology | Admitting: Radiation Oncology

## 2022-10-03 VITALS — BP 134/74 | HR 65 | Temp 97.9°F | Resp 18 | Ht 69.0 in | Wt 163.0 lb

## 2022-10-03 DIAGNOSIS — M8588 Other specified disorders of bone density and structure, other site: Secondary | ICD-10-CM | POA: Insufficient documentation

## 2022-10-03 DIAGNOSIS — Z79899 Other long term (current) drug therapy: Secondary | ICD-10-CM | POA: Insufficient documentation

## 2022-10-03 DIAGNOSIS — Z7982 Long term (current) use of aspirin: Secondary | ICD-10-CM | POA: Insufficient documentation

## 2022-10-03 DIAGNOSIS — Z510 Encounter for antineoplastic radiation therapy: Secondary | ICD-10-CM | POA: Diagnosis not present

## 2022-10-03 DIAGNOSIS — C50811 Malignant neoplasm of overlapping sites of right female breast: Secondary | ICD-10-CM | POA: Insufficient documentation

## 2022-10-03 DIAGNOSIS — Z9011 Acquired absence of right breast and nipple: Secondary | ICD-10-CM | POA: Insufficient documentation

## 2022-10-03 DIAGNOSIS — Z95828 Presence of other vascular implants and grafts: Secondary | ICD-10-CM

## 2022-10-03 DIAGNOSIS — I10 Essential (primary) hypertension: Secondary | ICD-10-CM | POA: Insufficient documentation

## 2022-10-03 DIAGNOSIS — Z5112 Encounter for antineoplastic immunotherapy: Secondary | ICD-10-CM | POA: Insufficient documentation

## 2022-10-03 DIAGNOSIS — Z170 Estrogen receptor positive status [ER+]: Secondary | ICD-10-CM | POA: Insufficient documentation

## 2022-10-03 DIAGNOSIS — Z5111 Encounter for antineoplastic chemotherapy: Secondary | ICD-10-CM | POA: Diagnosis not present

## 2022-10-03 LAB — CBC WITH DIFFERENTIAL (CANCER CENTER ONLY)
Abs Immature Granulocytes: 0.02 10*3/uL (ref 0.00–0.07)
Basophils Absolute: 0.1 10*3/uL (ref 0.0–0.1)
Basophils Relative: 1 %
Eosinophils Absolute: 0.2 10*3/uL (ref 0.0–0.5)
Eosinophils Relative: 4 %
HCT: 39.7 % (ref 36.0–46.0)
Hemoglobin: 13.3 g/dL (ref 12.0–15.0)
Immature Granulocytes: 0 %
Lymphocytes Relative: 14 %
Lymphs Abs: 0.7 10*3/uL (ref 0.7–4.0)
MCH: 31.6 pg (ref 26.0–34.0)
MCHC: 33.5 g/dL (ref 30.0–36.0)
MCV: 94.3 fL (ref 80.0–100.0)
Monocytes Absolute: 0.7 10*3/uL (ref 0.1–1.0)
Monocytes Relative: 13 %
Neutro Abs: 3.4 10*3/uL (ref 1.7–7.7)
Neutrophils Relative %: 68 %
Platelet Count: 173 10*3/uL (ref 150–400)
RBC: 4.21 MIL/uL (ref 3.87–5.11)
RDW: 13.7 % (ref 11.5–15.5)
WBC Count: 5 10*3/uL (ref 4.0–10.5)
nRBC: 0 % (ref 0.0–0.2)

## 2022-10-03 LAB — RAD ONC ARIA SESSION SUMMARY
Course Elapsed Days: 22
Plan Fractions Treated to Date: 17
Plan Fractions Treated to Date: 9
Plan Prescribed Dose Per Fraction: 1.8 Gy
Plan Prescribed Dose Per Fraction: 1.8 Gy
Plan Total Fractions Prescribed: 14
Plan Total Fractions Prescribed: 28
Plan Total Prescribed Dose: 25.2 Gy
Plan Total Prescribed Dose: 50.4 Gy
Reference Point Dosage Given to Date: 16.2 Gy
Reference Point Dosage Given to Date: 30.6 Gy
Reference Point Session Dosage Given: 1.8 Gy
Reference Point Session Dosage Given: 1.8 Gy
Session Number: 17

## 2022-10-03 LAB — CMP (CANCER CENTER ONLY)
ALT: 20 U/L (ref 0–44)
AST: 24 U/L (ref 15–41)
Albumin: 3.8 g/dL (ref 3.5–5.0)
Alkaline Phosphatase: 103 U/L (ref 38–126)
Anion gap: 6 (ref 5–15)
BUN: 14 mg/dL (ref 8–23)
CO2: 27 mmol/L (ref 22–32)
Calcium: 9.2 mg/dL (ref 8.9–10.3)
Chloride: 104 mmol/L (ref 98–111)
Creatinine: 0.72 mg/dL (ref 0.44–1.00)
GFR, Estimated: >60 mL/min (ref >60)
Glucose, Bld: 111 mg/dL — ABNORMAL HIGH (ref 70–99)
Potassium: 4.2 mmol/L (ref 3.5–5.1)
Sodium: 137 mmol/L (ref 135–145)
Total Bilirubin: 0.4 mg/dL (ref 0.3–1.2)
Total Protein: 7.1 g/dL (ref 6.5–8.1)

## 2022-10-03 MED ORDER — SODIUM CHLORIDE 0.9% FLUSH
10.0000 mL | Freq: Once | INTRAVENOUS | Status: AC
  Administered 2022-10-03: 10 mL

## 2022-10-03 MED ORDER — SODIUM CHLORIDE 0.9 % IV SOLN: Freq: Once | INTRAVENOUS | Status: AC

## 2022-10-03 MED ORDER — SODIUM CHLORIDE 0.9 % IV SOLN
3.6000 mg/kg | Freq: Once | INTRAVENOUS | Status: AC
  Administered 2022-10-03: 260 mg via INTRAVENOUS
  Filled 2022-10-03: qty 8

## 2022-10-03 MED ORDER — SODIUM CHLORIDE 0.9% FLUSH
10.0000 mL | INTRAVENOUS | Status: DC | PRN
  Administered 2022-10-03: 10 mL

## 2022-10-03 MED ORDER — DIPHENHYDRAMINE HCL 25 MG PO CAPS
50.0000 mg | ORAL_CAPSULE | Freq: Once | ORAL | Status: AC
  Administered 2022-10-03: 50 mg via ORAL
  Filled 2022-10-03: qty 2

## 2022-10-03 MED ORDER — HEPARIN SOD (PORK) LOCK FLUSH 100 UNIT/ML IV SOLN
500.0000 [IU] | Freq: Once | INTRAVENOUS | Status: AC | PRN
  Administered 2022-10-03: 500 [IU]

## 2022-10-03 MED ORDER — ACETAMINOPHEN 325 MG PO TABS
650.0000 mg | ORAL_TABLET | Freq: Once | ORAL | Status: AC
  Administered 2022-10-03: 650 mg via ORAL
  Filled 2022-10-03: qty 2

## 2022-10-03 MED ORDER — PROCHLORPERAZINE MALEATE 10 MG PO TABS
10.0000 mg | ORAL_TABLET | Freq: Once | ORAL | Status: AC
  Administered 2022-10-03: 10 mg via ORAL
  Filled 2022-10-03: qty 1

## 2022-10-04 ENCOUNTER — Other Ambulatory Visit: Payer: Self-pay

## 2022-10-04 ENCOUNTER — Ambulatory Visit
Admission: RE | Admit: 2022-10-04 | Discharge: 2022-10-04 | Disposition: A | Discharge status: Home / Self Care | Payer: PPO | Source: Ambulatory Visit | Attending: Radiation Oncology | Admitting: Radiation Oncology

## 2022-10-04 DIAGNOSIS — Z170 Estrogen receptor positive status [ER+]: Secondary | ICD-10-CM | POA: Diagnosis not present

## 2022-10-04 DIAGNOSIS — C50811 Malignant neoplasm of overlapping sites of right female breast: Secondary | ICD-10-CM | POA: Diagnosis not present

## 2022-10-04 DIAGNOSIS — Z510 Encounter for antineoplastic radiation therapy: Secondary | ICD-10-CM | POA: Diagnosis not present

## 2022-10-04 LAB — RAD ONC ARIA SESSION SUMMARY
Course Elapsed Days: 23
Plan Fractions Treated to Date: 18
Plan Fractions Treated to Date: 9
Plan Prescribed Dose Per Fraction: 1.8 Gy
Plan Prescribed Dose Per Fraction: 1.8 Gy
Plan Total Fractions Prescribed: 14
Plan Total Fractions Prescribed: 28
Plan Total Prescribed Dose: 25.2 Gy
Plan Total Prescribed Dose: 50.4 Gy
Reference Point Dosage Given to Date: 16.2 Gy
Reference Point Dosage Given to Date: 32.4 Gy
Reference Point Session Dosage Given: 1.8 Gy
Reference Point Session Dosage Given: 1.8 Gy
Session Number: 18

## 2022-10-05 ENCOUNTER — Ambulatory Visit
Admission: RE | Admit: 2022-10-05 | Discharge: 2022-10-05 | Disposition: A | Discharge status: Home / Self Care | Payer: PPO | Source: Ambulatory Visit | Attending: Radiation Oncology | Admitting: Radiation Oncology

## 2022-10-05 ENCOUNTER — Other Ambulatory Visit: Payer: Self-pay

## 2022-10-05 DIAGNOSIS — Z510 Encounter for antineoplastic radiation therapy: Secondary | ICD-10-CM | POA: Diagnosis not present

## 2022-10-05 DIAGNOSIS — Z170 Estrogen receptor positive status [ER+]: Secondary | ICD-10-CM | POA: Diagnosis not present

## 2022-10-05 DIAGNOSIS — C50811 Malignant neoplasm of overlapping sites of right female breast: Secondary | ICD-10-CM | POA: Diagnosis not present

## 2022-10-05 LAB — RAD ONC ARIA SESSION SUMMARY
Course Elapsed Days: 24
Plan Fractions Treated to Date: 10
Plan Fractions Treated to Date: 19
Plan Prescribed Dose Per Fraction: 1.8 Gy
Plan Prescribed Dose Per Fraction: 1.8 Gy
Plan Total Fractions Prescribed: 14
Plan Total Fractions Prescribed: 28
Plan Total Prescribed Dose: 25.2 Gy
Plan Total Prescribed Dose: 50.4 Gy
Reference Point Dosage Given to Date: 18 Gy
Reference Point Dosage Given to Date: 34.2 Gy
Reference Point Session Dosage Given: 1.8 Gy
Reference Point Session Dosage Given: 1.8 Gy
Session Number: 19

## 2022-10-08 ENCOUNTER — Ambulatory Visit
Admission: RE | Admit: 2022-10-08 | Discharge: 2022-10-08 | Disposition: A | Discharge status: Home / Self Care | Payer: PPO | Source: Ambulatory Visit | Attending: Radiation Oncology | Admitting: Radiation Oncology

## 2022-10-08 ENCOUNTER — Other Ambulatory Visit: Payer: Self-pay

## 2022-10-08 DIAGNOSIS — Z510 Encounter for antineoplastic radiation therapy: Secondary | ICD-10-CM | POA: Diagnosis not present

## 2022-10-08 DIAGNOSIS — Z170 Estrogen receptor positive status [ER+]: Secondary | ICD-10-CM | POA: Diagnosis not present

## 2022-10-08 DIAGNOSIS — C50811 Malignant neoplasm of overlapping sites of right female breast: Secondary | ICD-10-CM | POA: Diagnosis not present

## 2022-10-08 LAB — RAD ONC ARIA SESSION SUMMARY
Course Elapsed Days: 27
Plan Fractions Treated to Date: 10
Plan Fractions Treated to Date: 20
Plan Prescribed Dose Per Fraction: 1.8 Gy
Plan Prescribed Dose Per Fraction: 1.8 Gy
Plan Total Fractions Prescribed: 14
Plan Total Fractions Prescribed: 28
Plan Total Prescribed Dose: 25.2 Gy
Plan Total Prescribed Dose: 50.4 Gy
Reference Point Dosage Given to Date: 18 Gy
Reference Point Dosage Given to Date: 36 Gy
Reference Point Session Dosage Given: 1.8 Gy
Reference Point Session Dosage Given: 1.8 Gy
Session Number: 20

## 2022-10-09 ENCOUNTER — Other Ambulatory Visit: Payer: Self-pay

## 2022-10-09 ENCOUNTER — Other Ambulatory Visit (HOSPITAL_COMMUNITY): Payer: Self-pay

## 2022-10-09 ENCOUNTER — Ambulatory Visit
Admission: RE | Admit: 2022-10-09 | Discharge: 2022-10-09 | Disposition: A | Discharge status: Home / Self Care | Payer: PPO | Source: Ambulatory Visit | Attending: Radiation Oncology | Admitting: Radiation Oncology

## 2022-10-09 DIAGNOSIS — Z510 Encounter for antineoplastic radiation therapy: Secondary | ICD-10-CM | POA: Diagnosis not present

## 2022-10-09 DIAGNOSIS — I2510 Atherosclerotic heart disease of native coronary artery without angina pectoris: Secondary | ICD-10-CM | POA: Diagnosis not present

## 2022-10-09 DIAGNOSIS — Z6824 Body mass index (BMI) 24.0-24.9, adult: Secondary | ICD-10-CM | POA: Diagnosis not present

## 2022-10-09 DIAGNOSIS — I714 Abdominal aortic aneurysm, without rupture, unspecified: Secondary | ICD-10-CM | POA: Diagnosis not present

## 2022-10-09 DIAGNOSIS — M8588 Other specified disorders of bone density and structure, other site: Secondary | ICD-10-CM | POA: Diagnosis not present

## 2022-10-09 DIAGNOSIS — C50919 Malignant neoplasm of unspecified site of unspecified female breast: Secondary | ICD-10-CM | POA: Diagnosis not present

## 2022-10-09 DIAGNOSIS — F3342 Major depressive disorder, recurrent, in full remission: Secondary | ICD-10-CM | POA: Diagnosis not present

## 2022-10-09 DIAGNOSIS — R7309 Other abnormal glucose: Secondary | ICD-10-CM | POA: Diagnosis not present

## 2022-10-09 DIAGNOSIS — Z23 Encounter for immunization: Secondary | ICD-10-CM | POA: Diagnosis not present

## 2022-10-09 DIAGNOSIS — C50811 Malignant neoplasm of overlapping sites of right female breast: Secondary | ICD-10-CM | POA: Diagnosis not present

## 2022-10-09 DIAGNOSIS — J439 Emphysema, unspecified: Secondary | ICD-10-CM | POA: Diagnosis not present

## 2022-10-09 DIAGNOSIS — E7800 Pure hypercholesterolemia, unspecified: Secondary | ICD-10-CM | POA: Diagnosis not present

## 2022-10-09 DIAGNOSIS — I6529 Occlusion and stenosis of unspecified carotid artery: Secondary | ICD-10-CM | POA: Diagnosis not present

## 2022-10-09 DIAGNOSIS — Z170 Estrogen receptor positive status [ER+]: Secondary | ICD-10-CM | POA: Diagnosis not present

## 2022-10-09 DIAGNOSIS — I10 Essential (primary) hypertension: Secondary | ICD-10-CM | POA: Diagnosis not present

## 2022-10-09 LAB — RAD ONC ARIA SESSION SUMMARY
Course Elapsed Days: 28
Plan Fractions Treated to Date: 11
Plan Fractions Treated to Date: 21
Plan Prescribed Dose Per Fraction: 1.8 Gy
Plan Prescribed Dose Per Fraction: 1.8 Gy
Plan Total Fractions Prescribed: 14
Plan Total Fractions Prescribed: 28
Plan Total Prescribed Dose: 25.2 Gy
Plan Total Prescribed Dose: 50.4 Gy
Reference Point Dosage Given to Date: 19.8 Gy
Reference Point Dosage Given to Date: 37.8 Gy
Reference Point Session Dosage Given: 1.8 Gy
Reference Point Session Dosage Given: 1.8 Gy
Session Number: 21

## 2022-10-09 MED ORDER — FLUOXETINE HCL 20 MG PO CAPS
20.0000 mg | ORAL_CAPSULE | Freq: Every day | ORAL | 1 refills | Status: DC
  Filled 2022-11-29: qty 90, 90d supply, fill #0
  Filled 2023-03-06: qty 90, 90d supply, fill #1

## 2022-10-10 ENCOUNTER — Ambulatory Visit
Admission: RE | Admit: 2022-10-10 | Discharge: 2022-10-10 | Disposition: A | Discharge status: Home / Self Care | Payer: PPO | Source: Ambulatory Visit | Attending: Radiation Oncology | Admitting: Radiation Oncology

## 2022-10-10 ENCOUNTER — Other Ambulatory Visit: Payer: Self-pay | Admitting: Family Medicine

## 2022-10-10 ENCOUNTER — Other Ambulatory Visit: Payer: Self-pay

## 2022-10-10 DIAGNOSIS — Z170 Estrogen receptor positive status [ER+]: Secondary | ICD-10-CM | POA: Diagnosis not present

## 2022-10-10 DIAGNOSIS — C50811 Malignant neoplasm of overlapping sites of right female breast: Secondary | ICD-10-CM | POA: Diagnosis not present

## 2022-10-10 DIAGNOSIS — Z510 Encounter for antineoplastic radiation therapy: Secondary | ICD-10-CM | POA: Diagnosis not present

## 2022-10-10 DIAGNOSIS — M8580 Other specified disorders of bone density and structure, unspecified site: Secondary | ICD-10-CM

## 2022-10-10 LAB — RAD ONC ARIA SESSION SUMMARY
Course Elapsed Days: 29
Plan Fractions Treated to Date: 11
Plan Fractions Treated to Date: 22
Plan Prescribed Dose Per Fraction: 1.8 Gy
Plan Prescribed Dose Per Fraction: 1.8 Gy
Plan Total Fractions Prescribed: 14
Plan Total Fractions Prescribed: 28
Plan Total Prescribed Dose: 25.2 Gy
Plan Total Prescribed Dose: 50.4 Gy
Reference Point Dosage Given to Date: 19.8 Gy
Reference Point Dosage Given to Date: 39.6 Gy
Reference Point Session Dosage Given: 1.8 Gy
Reference Point Session Dosage Given: 1.8 Gy
Session Number: 22

## 2022-10-11 ENCOUNTER — Other Ambulatory Visit: Payer: Self-pay

## 2022-10-11 ENCOUNTER — Ambulatory Visit
Admission: RE | Admit: 2022-10-11 | Discharge: 2022-10-11 | Disposition: A | Discharge status: Home / Self Care | Payer: PPO | Source: Ambulatory Visit | Attending: Radiation Oncology | Admitting: Radiation Oncology

## 2022-10-11 DIAGNOSIS — C50811 Malignant neoplasm of overlapping sites of right female breast: Secondary | ICD-10-CM | POA: Diagnosis not present

## 2022-10-11 DIAGNOSIS — Z510 Encounter for antineoplastic radiation therapy: Secondary | ICD-10-CM | POA: Diagnosis not present

## 2022-10-11 DIAGNOSIS — Z170 Estrogen receptor positive status [ER+]: Secondary | ICD-10-CM | POA: Diagnosis not present

## 2022-10-11 LAB — RAD ONC ARIA SESSION SUMMARY
Course Elapsed Days: 30
Plan Fractions Treated to Date: 12
Plan Fractions Treated to Date: 23
Plan Prescribed Dose Per Fraction: 1.8 Gy
Plan Prescribed Dose Per Fraction: 1.8 Gy
Plan Total Fractions Prescribed: 14
Plan Total Fractions Prescribed: 28
Plan Total Prescribed Dose: 25.2 Gy
Plan Total Prescribed Dose: 50.4 Gy
Reference Point Dosage Given to Date: 21.6 Gy
Reference Point Dosage Given to Date: 41.4 Gy
Reference Point Session Dosage Given: 1.8 Gy
Reference Point Session Dosage Given: 1.8 Gy
Session Number: 23

## 2022-10-12 ENCOUNTER — Other Ambulatory Visit: Payer: Self-pay

## 2022-10-12 ENCOUNTER — Ambulatory Visit: Payer: PPO | Admitting: Cardiology

## 2022-10-12 ENCOUNTER — Ambulatory Visit
Admission: RE | Admit: 2022-10-12 | Discharge: 2022-10-12 | Disposition: A | Discharge status: Home / Self Care | Payer: PPO | Source: Ambulatory Visit | Attending: Radiation Oncology | Admitting: Radiation Oncology

## 2022-10-12 ENCOUNTER — Ambulatory Visit
Admission: RE | Admit: 2022-10-12 | Discharge: 2022-10-12 | Disposition: A | Discharge status: Home / Self Care | Payer: PPO | Source: Ambulatory Visit | Admitting: Radiation Oncology

## 2022-10-12 ENCOUNTER — Ambulatory Visit: Admission: RE | Admit: 2022-10-12 | Payer: PPO | Source: Ambulatory Visit | Admitting: Radiation Oncology

## 2022-10-12 DIAGNOSIS — Z170 Estrogen receptor positive status [ER+]: Secondary | ICD-10-CM | POA: Diagnosis not present

## 2022-10-12 DIAGNOSIS — Z510 Encounter for antineoplastic radiation therapy: Secondary | ICD-10-CM | POA: Diagnosis not present

## 2022-10-12 DIAGNOSIS — C50811 Malignant neoplasm of overlapping sites of right female breast: Secondary | ICD-10-CM | POA: Diagnosis not present

## 2022-10-12 LAB — RAD ONC ARIA SESSION SUMMARY
Course Elapsed Days: 31
Plan Fractions Treated to Date: 12
Plan Fractions Treated to Date: 24
Plan Prescribed Dose Per Fraction: 1.8 Gy
Plan Prescribed Dose Per Fraction: 1.8 Gy
Plan Total Fractions Prescribed: 14
Plan Total Fractions Prescribed: 28
Plan Total Prescribed Dose: 25.2 Gy
Plan Total Prescribed Dose: 50.4 Gy
Reference Point Dosage Given to Date: 21.6 Gy
Reference Point Dosage Given to Date: 43.2 Gy
Reference Point Session Dosage Given: 1.8 Gy
Reference Point Session Dosage Given: 1.8 Gy
Session Number: 24

## 2022-10-15 ENCOUNTER — Other Ambulatory Visit: Payer: Self-pay

## 2022-10-15 ENCOUNTER — Ambulatory Visit
Admission: RE | Admit: 2022-10-15 | Discharge: 2022-10-15 | Disposition: A | Discharge status: Home / Self Care | Payer: PPO | Source: Ambulatory Visit | Attending: Radiation Oncology | Admitting: Radiation Oncology

## 2022-10-15 ENCOUNTER — Ambulatory Visit: Payer: PPO | Admitting: Radiation Oncology

## 2022-10-15 ENCOUNTER — Ambulatory Visit: Payer: PPO | Admitting: Cardiology

## 2022-10-15 DIAGNOSIS — Z170 Estrogen receptor positive status [ER+]: Secondary | ICD-10-CM | POA: Diagnosis not present

## 2022-10-15 DIAGNOSIS — C50811 Malignant neoplasm of overlapping sites of right female breast: Secondary | ICD-10-CM | POA: Diagnosis not present

## 2022-10-15 DIAGNOSIS — Z510 Encounter for antineoplastic radiation therapy: Secondary | ICD-10-CM | POA: Diagnosis not present

## 2022-10-15 LAB — RAD ONC ARIA SESSION SUMMARY
Course Elapsed Days: 34
Plan Fractions Treated to Date: 13
Plan Fractions Treated to Date: 25
Plan Prescribed Dose Per Fraction: 1.8 Gy
Plan Prescribed Dose Per Fraction: 1.8 Gy
Plan Total Fractions Prescribed: 14
Plan Total Fractions Prescribed: 28
Plan Total Prescribed Dose: 25.2 Gy
Plan Total Prescribed Dose: 50.4 Gy
Reference Point Dosage Given to Date: 23.4 Gy
Reference Point Dosage Given to Date: 45 Gy
Reference Point Session Dosage Given: 1.8 Gy
Reference Point Session Dosage Given: 1.8 Gy
Session Number: 25

## 2022-10-16 ENCOUNTER — Ambulatory Visit
Admission: RE | Admit: 2022-10-16 | Discharge: 2022-10-16 | Disposition: A | Discharge status: Home / Self Care | Payer: PPO | Source: Ambulatory Visit | Attending: Radiation Oncology | Admitting: Radiation Oncology

## 2022-10-16 ENCOUNTER — Other Ambulatory Visit: Payer: Self-pay

## 2022-10-16 ENCOUNTER — Ambulatory Visit: Payer: PPO | Admitting: Radiation Oncology

## 2022-10-16 DIAGNOSIS — C50811 Malignant neoplasm of overlapping sites of right female breast: Secondary | ICD-10-CM | POA: Diagnosis not present

## 2022-10-16 DIAGNOSIS — Z170 Estrogen receptor positive status [ER+]: Secondary | ICD-10-CM | POA: Diagnosis not present

## 2022-10-16 DIAGNOSIS — Z510 Encounter for antineoplastic radiation therapy: Secondary | ICD-10-CM | POA: Diagnosis not present

## 2022-10-16 LAB — RAD ONC ARIA SESSION SUMMARY
Course Elapsed Days: 35
Plan Fractions Treated to Date: 13
Plan Fractions Treated to Date: 26
Plan Prescribed Dose Per Fraction: 1.8 Gy
Plan Prescribed Dose Per Fraction: 1.8 Gy
Plan Total Fractions Prescribed: 14
Plan Total Fractions Prescribed: 28
Plan Total Prescribed Dose: 25.2 Gy
Plan Total Prescribed Dose: 50.4 Gy
Reference Point Dosage Given to Date: 23.4 Gy
Reference Point Dosage Given to Date: 46.8 Gy
Reference Point Session Dosage Given: 1.8 Gy
Reference Point Session Dosage Given: 1.8 Gy
Session Number: 26

## 2022-10-16 MED ORDER — RADIAPLEXRX EX GEL: Freq: Once | CUTANEOUS | Status: AC

## 2022-10-17 ENCOUNTER — Ambulatory Visit
Admission: RE | Admit: 2022-10-17 | Discharge: 2022-10-17 | Disposition: A | Discharge status: Home / Self Care | Payer: PPO | Source: Ambulatory Visit | Attending: Radiation Oncology | Admitting: Radiation Oncology

## 2022-10-17 ENCOUNTER — Other Ambulatory Visit: Payer: Self-pay

## 2022-10-17 DIAGNOSIS — Z170 Estrogen receptor positive status [ER+]: Secondary | ICD-10-CM | POA: Diagnosis not present

## 2022-10-17 DIAGNOSIS — C50811 Malignant neoplasm of overlapping sites of right female breast: Secondary | ICD-10-CM | POA: Diagnosis not present

## 2022-10-17 DIAGNOSIS — Z510 Encounter for antineoplastic radiation therapy: Secondary | ICD-10-CM | POA: Diagnosis not present

## 2022-10-17 LAB — RAD ONC ARIA SESSION SUMMARY
Course Elapsed Days: 36
Plan Fractions Treated to Date: 14
Plan Fractions Treated to Date: 27
Plan Prescribed Dose Per Fraction: 1.8 Gy
Plan Prescribed Dose Per Fraction: 1.8 Gy
Plan Total Fractions Prescribed: 14
Plan Total Fractions Prescribed: 28
Plan Total Prescribed Dose: 25.2 Gy
Plan Total Prescribed Dose: 50.4 Gy
Reference Point Dosage Given to Date: 25.2 Gy
Reference Point Dosage Given to Date: 48.6 Gy
Reference Point Session Dosage Given: 1.8 Gy
Reference Point Session Dosage Given: 1.8 Gy
Session Number: 27

## 2022-10-18 ENCOUNTER — Other Ambulatory Visit: Payer: Self-pay

## 2022-10-18 ENCOUNTER — Ambulatory Visit
Admission: RE | Admit: 2022-10-18 | Discharge: 2022-10-18 | Disposition: A | Discharge status: Home / Self Care | Payer: PPO | Source: Ambulatory Visit | Attending: Radiation Oncology | Admitting: Radiation Oncology

## 2022-10-18 ENCOUNTER — Ambulatory Visit: Payer: PPO

## 2022-10-18 DIAGNOSIS — Z170 Estrogen receptor positive status [ER+]: Secondary | ICD-10-CM | POA: Diagnosis not present

## 2022-10-18 DIAGNOSIS — C50811 Malignant neoplasm of overlapping sites of right female breast: Secondary | ICD-10-CM | POA: Diagnosis not present

## 2022-10-18 DIAGNOSIS — Z510 Encounter for antineoplastic radiation therapy: Secondary | ICD-10-CM | POA: Diagnosis not present

## 2022-10-18 LAB — RAD ONC ARIA SESSION SUMMARY
Course Elapsed Days: 37
Plan Fractions Treated to Date: 14
Plan Fractions Treated to Date: 28
Plan Prescribed Dose Per Fraction: 1.8 Gy
Plan Prescribed Dose Per Fraction: 1.8 Gy
Plan Total Fractions Prescribed: 14
Plan Total Fractions Prescribed: 28
Plan Total Prescribed Dose: 25.2 Gy
Plan Total Prescribed Dose: 50.4 Gy
Reference Point Dosage Given to Date: 25.2 Gy
Reference Point Dosage Given to Date: 50.4 Gy
Reference Point Session Dosage Given: 1.8 Gy
Reference Point Session Dosage Given: 1.8 Gy
Session Number: 28

## 2022-10-19 ENCOUNTER — Other Ambulatory Visit: Payer: Self-pay

## 2022-10-19 ENCOUNTER — Ambulatory Visit
Admission: RE | Admit: 2022-10-19 | Discharge: 2022-10-19 | Disposition: A | Discharge status: Home / Self Care | Payer: PPO | Source: Ambulatory Visit | Attending: Radiation Oncology | Admitting: Radiation Oncology

## 2022-10-19 ENCOUNTER — Ambulatory Visit: Payer: PPO

## 2022-10-19 DIAGNOSIS — Z510 Encounter for antineoplastic radiation therapy: Secondary | ICD-10-CM | POA: Diagnosis not present

## 2022-10-19 LAB — RAD ONC ARIA SESSION SUMMARY
Course Elapsed Days: 38
Plan Fractions Treated to Date: 1
Plan Prescribed Dose Per Fraction: 2 Gy
Plan Total Fractions Prescribed: 5
Plan Total Prescribed Dose: 10 Gy
Reference Point Dosage Given to Date: 2 Gy
Reference Point Session Dosage Given: 2 Gy
Session Number: 29

## 2022-10-21 ENCOUNTER — Ambulatory Visit
Admission: RE | Admit: 2022-10-21 | Discharge: 2022-10-21 | Disposition: A | Discharge status: Home / Self Care | Payer: PPO | Source: Ambulatory Visit | Attending: Radiation Oncology | Admitting: Radiation Oncology

## 2022-10-21 ENCOUNTER — Other Ambulatory Visit: Payer: Self-pay

## 2022-10-21 DIAGNOSIS — Z170 Estrogen receptor positive status [ER+]: Secondary | ICD-10-CM | POA: Diagnosis not present

## 2022-10-21 DIAGNOSIS — Z510 Encounter for antineoplastic radiation therapy: Secondary | ICD-10-CM | POA: Diagnosis not present

## 2022-10-21 DIAGNOSIS — C50811 Malignant neoplasm of overlapping sites of right female breast: Secondary | ICD-10-CM | POA: Diagnosis not present

## 2022-10-21 DIAGNOSIS — M8580 Other specified disorders of bone density and structure, unspecified site: Secondary | ICD-10-CM | POA: Insufficient documentation

## 2022-10-21 LAB — RAD ONC ARIA SESSION SUMMARY
Course Elapsed Days: 40
Plan Fractions Treated to Date: 2
Plan Prescribed Dose Per Fraction: 2 Gy
Plan Total Fractions Prescribed: 5
Plan Total Prescribed Dose: 10 Gy
Reference Point Dosage Given to Date: 4 Gy
Reference Point Session Dosage Given: 2 Gy
Session Number: 30

## 2022-10-21 NOTE — Assessment & Plan Note (Signed)
-  Her most recent DEXA was on 04/29/20 showing osteopenia (T-score of -2.1 at L1-3). -continue calcium and vitD

## 2022-10-21 NOTE — Assessment & Plan Note (Signed)
-  Diagnosed in 01/2022 -s/p neoadjuvant chemo TCHP X6, followed by right mastectomy by Dr. Donne Hazel and reconstruction by Dr. Iran Planas. Unfortunately she did not have much response to neoadjvuant chemo  -on adjuvant Kadcyla now  -she is also receiving postmastectomy radiation under Dr. Lisbeth Renshaw, 10/10 - 10/29/22.

## 2022-10-21 NOTE — Assessment & Plan Note (Signed)
-  continue medications

## 2022-10-22 ENCOUNTER — Inpatient Hospital Stay (HOSPITAL_BASED_OUTPATIENT_CLINIC_OR_DEPARTMENT_OTHER): Payer: PPO | Admitting: Hematology

## 2022-10-22 ENCOUNTER — Ambulatory Visit
Admission: RE | Admit: 2022-10-22 | Discharge: 2022-10-22 | Disposition: A | Discharge status: Home / Self Care | Payer: PPO | Source: Ambulatory Visit | Attending: Radiation Oncology | Admitting: Radiation Oncology

## 2022-10-22 ENCOUNTER — Inpatient Hospital Stay: Payer: PPO

## 2022-10-22 ENCOUNTER — Ambulatory Visit: Payer: PPO

## 2022-10-22 ENCOUNTER — Encounter: Payer: Self-pay | Admitting: Hematology

## 2022-10-22 ENCOUNTER — Other Ambulatory Visit: Payer: Self-pay

## 2022-10-22 VITALS — BP 142/85 | HR 64 | Temp 97.8°F | Resp 19 | Ht 69.0 in | Wt 161.9 lb

## 2022-10-22 DIAGNOSIS — Z170 Estrogen receptor positive status [ER+]: Secondary | ICD-10-CM

## 2022-10-22 DIAGNOSIS — M8588 Other specified disorders of bone density and structure, other site: Secondary | ICD-10-CM | POA: Diagnosis not present

## 2022-10-22 DIAGNOSIS — C50811 Malignant neoplasm of overlapping sites of right female breast: Secondary | ICD-10-CM | POA: Diagnosis not present

## 2022-10-22 DIAGNOSIS — Z510 Encounter for antineoplastic radiation therapy: Secondary | ICD-10-CM | POA: Diagnosis not present

## 2022-10-22 DIAGNOSIS — I10 Essential (primary) hypertension: Secondary | ICD-10-CM

## 2022-10-22 DIAGNOSIS — Z95828 Presence of other vascular implants and grafts: Secondary | ICD-10-CM

## 2022-10-22 LAB — CBC WITH DIFFERENTIAL (CANCER CENTER ONLY)
Abs Immature Granulocytes: 0.02 10*3/uL (ref 0.00–0.07)
Basophils Absolute: 0.1 10*3/uL (ref 0.0–0.1)
Basophils Relative: 1 %
Eosinophils Absolute: 0.2 10*3/uL (ref 0.0–0.5)
Eosinophils Relative: 4 %
HCT: 40.8 % (ref 36.0–46.0)
Hemoglobin: 13.9 g/dL (ref 12.0–15.0)
Immature Granulocytes: 0 %
Lymphocytes Relative: 14 %
Lymphs Abs: 0.6 10*3/uL — ABNORMAL LOW (ref 0.7–4.0)
MCH: 32 pg (ref 26.0–34.0)
MCHC: 34.1 g/dL (ref 30.0–36.0)
MCV: 93.8 fL (ref 80.0–100.0)
Monocytes Absolute: 0.7 10*3/uL (ref 0.1–1.0)
Monocytes Relative: 15 %
Neutro Abs: 3 10*3/uL (ref 1.7–7.7)
Neutrophils Relative %: 66 %
Platelet Count: 182 10*3/uL (ref 150–400)
RBC: 4.35 MIL/uL (ref 3.87–5.11)
RDW: 14.3 % (ref 11.5–15.5)
WBC Count: 4.6 10*3/uL (ref 4.0–10.5)
nRBC: 0 % (ref 0.0–0.2)

## 2022-10-22 LAB — RAD ONC ARIA SESSION SUMMARY
Course Elapsed Days: 41
Plan Fractions Treated to Date: 3
Plan Prescribed Dose Per Fraction: 2 Gy
Plan Total Fractions Prescribed: 5
Plan Total Prescribed Dose: 10 Gy
Reference Point Dosage Given to Date: 6 Gy
Reference Point Session Dosage Given: 2 Gy
Session Number: 31

## 2022-10-22 LAB — CMP (CANCER CENTER ONLY)
ALT: 17 U/L (ref 0–44)
AST: 23 U/L (ref 15–41)
Albumin: 3.9 g/dL (ref 3.5–5.0)
Alkaline Phosphatase: 109 U/L (ref 38–126)
Anion gap: 6 (ref 5–15)
BUN: 12 mg/dL (ref 8–23)
CO2: 29 mmol/L (ref 22–32)
Calcium: 9.6 mg/dL (ref 8.9–10.3)
Chloride: 101 mmol/L (ref 98–111)
Creatinine: 0.63 mg/dL (ref 0.44–1.00)
GFR, Estimated: >60 mL/min (ref >60)
Glucose, Bld: 114 mg/dL — ABNORMAL HIGH (ref 70–99)
Potassium: 4 mmol/L (ref 3.5–5.1)
Sodium: 136 mmol/L (ref 135–145)
Total Bilirubin: 0.4 mg/dL (ref 0.3–1.2)
Total Protein: 7.5 g/dL (ref 6.5–8.1)

## 2022-10-22 MED ORDER — ACETAMINOPHEN 325 MG PO TABS
650.0000 mg | ORAL_TABLET | Freq: Once | ORAL | Status: AC
  Administered 2022-10-22: 650 mg via ORAL
  Filled 2022-10-22: qty 2

## 2022-10-22 MED ORDER — SODIUM CHLORIDE 0.9% FLUSH
10.0000 mL | Freq: Once | INTRAVENOUS | Status: AC
  Administered 2022-10-22: 10 mL

## 2022-10-22 MED ORDER — CETIRIZINE HCL 10 MG PO TABS
10.0000 mg | ORAL_TABLET | Freq: Once | ORAL | Status: DC
  Filled 2022-10-22: qty 1

## 2022-10-22 MED ORDER — SODIUM CHLORIDE 0.9 % IV SOLN
3.6000 mg/kg | Freq: Once | INTRAVENOUS | Status: AC
  Administered 2022-10-22: 260 mg via INTRAVENOUS
  Filled 2022-10-22: qty 8

## 2022-10-22 MED ORDER — PROCHLORPERAZINE MALEATE 10 MG PO TABS
10.0000 mg | ORAL_TABLET | Freq: Once | ORAL | Status: AC
  Administered 2022-10-22: 10 mg via ORAL
  Filled 2022-10-22: qty 1

## 2022-10-22 MED ORDER — SODIUM CHLORIDE 0.9 % IV SOLN: Freq: Once | INTRAVENOUS | Status: AC

## 2022-10-22 MED ORDER — CETIRIZINE HCL 10 MG/ML IV SOLN
10.0000 mg | Freq: Once | INTRAVENOUS | Status: AC
  Administered 2022-10-22: 10 mg via INTRAVENOUS
  Filled 2022-10-22: qty 1

## 2022-10-22 NOTE — Progress Notes (Signed)
[place]   [phone] [phone]) [phone]   Clinic Follow up Note   Patient Care Team: Truitt Merle, MD as PCP - General (Hematology) Leonie Man, MD as PCP - Cardiology (Cardiology) Mauro Kaufmann, RN as Oncology Nurse Navigator Rockwell Germany, RN as Oncology Nurse Navigator Rolm Bookbinder, MD as Consulting Physician (General Surgery) Truitt Merle, MD as Consulting Physician (Hematology) Kyung Rudd, MD as Consulting Physician (Radiation Oncology)  Date of Service:  10/22/2022  CHIEF COMPLAINT: f/u of right breast cancer  CURRENT THERAPY:  Kadcyla, q21d, starting 08/24/22  -post-mastectomy radiation, 09/11/22 - 10/24/22  ASSESSMENT & PLAN:  Ashley Robertson is a 66 y.o. post-menopausal female with   1. Malignant neoplasm of overlapping sites of right breast, invasive lobular carcinoma, Stage IIIA, c(T3, N1), yp(T3, N1a), ER+/PR-/HER2+, Grade 2  -Diagnosed in 01/2022 -s/p neoadjuvant chemo TCHP X6, followed by right mastectomy by Dr. Donne Hazel and reconstruction by Dr. Iran Planas. Unfortunately she did not have much response to neoadjvuant chemo  -on adjuvant Kadcyla now, tolerating well with no noticeable side effects. Plan for one year treatment, through 08/2023. -she is also receiving postmastectomy radiation under Dr. Lisbeth Renshaw, 10/10 - 10/29/22. -she is doing well overall except for skin burning from radiation. Labs reviewed, overall WNL.   2. Essential hypertension -continue medications   3. Osteopenia -Her most recent DEXA was on 04/29/20 showing osteopenia (T-score of -2.1 at L1-3). -continue calcium and vitD    PLAN: -continue daily radiation through 10/24/22 -proceed with Kadcyla today -lab, flush, and Kadcyla every 3 weeks  -she prefers Wednesday appointments -f/u in 6 weeks   SUMMARY OF ONCOLOGIC HISTORY: Oncology History Overview Note   Cancer Staging  Malignant neoplasm of overlapping sites of right breast in female, estrogen  receptor positive ([place]) Staging form: Breast, AJCC 8th Edition - Clinical stage from 02/09/2022: Stage IIA (cT2, cN1, cM0, G2, ER+, PR-, HER2+) - Signed by Truitt Merle, MD on 02/20/2022    Malignant neoplasm of overlapping sites of right breast in female, estrogen receptor positive ([place])  02/08/2022 Mammogram   CLINICAL DATA:  Acute onset right breast lump.  EXAM: DIGITAL DIAGNOSTIC UNILATERAL RIGHT MAMMOGRAM WITH TOMOSYNTHESIS AND CAD; ULTRASOUND RIGHT BREAST LIMITED  IMPRESSION: 1. Highly suspicious findings mammographically and sonographically. I suspect there is significant malignancy throughout most of the right breast. Discrete masses are seen at 11 o'clock and 6 o'clock.  A single borderline lymph node is identified. This lymph node appears prominent compared to the remainder of the lymph nodes. The skin thickening suggests the possibility of inflammatory breast cancer.   02/09/2022 Cancer Staging   Staging form: Breast, AJCC 8th Edition - Clinical stage from 02/09/2022: Stage IIIA (cT3, cN1, cM0, G2, ER+, PR-, HER2+) - Signed by Truitt Merle, MD on 03/05/2022 Stage prefix: Initial diagnosis Histologic grading system: 3 grade system   02/09/2022 Initial Biopsy   Diagnosis 1. Breast, right, needle core biopsy, right breast 11 o'clock mass ribbon clip - INVASIVE MAMMARY CARCINOMA - SEE COMMENT 2. Breast, right, needle core biopsy, right breast 6'oclock mass, coil clip - INVASIVE MAMMARY CARCINOMA - SEE COMMENT 3. Lymph node, needle/core biopsy, right axillary lymph node, tribell clip - METASTATIC CARCINOMA INVOLVING NODAL TISSUE Microscopic Comment 1. and 2. The biopsy material shows an infiltrative proliferation of cells arranged linearly and in small clusters. Based on the biopsy, the carcinoma appears Nottingham grade 2 of 3 and measures 1.5 cm in greatest linear extent.  Addendum parts 1 and 2: Immunohistochemistry for  E-cadherin is negative consistent with lobular carcinoma.  2.  PROGNOSTIC INDICATORS Results: The tumor cells are EQUIVOCAL for Her2 (2+). Her2 by FISH will be performed and the results reported separately. Estrogen Receptor: 100%, POSITIVE, STRONG STAINING INTENSITY Progesterone Receptor: <1%, NEGATIVE Proliferation Marker Ki67: 10%  2. FLUORESCENCE IN-SITU HYBRIDIZATION Results: GROUP 1: HER2 **POSITIVE**   3. PROGNOSTIC INDICATORS Results: By immunohistochemistry, the tumor cells are POSITIVE for Her2 (3+). Estrogen Receptor: 100%, POSITIVE, STRONG STAINING INTENSITY Progesterone Receptor: <1%, NEGATIVE   02/16/2022 Initial Diagnosis   Malignant neoplasm of overlapping sites of right breast in female, estrogen receptor positive ([place])   03/01/2022 Imaging   EXAM: BILATERAL BREAST MRI WITH AND WITHOUT CONTRAST  IMPRESSION: 1. 8.5 x 8.3 x 5.9 cm area of confluent mass-like enhancement in the right breast involving all 4 quadrants and containing 2 biopsy marker clip artifacts, compatible with 4 quadrant biopsy-proven malignancy. 2. Biopsy-proven metastatic lymph node in the right axilla. 3. Possible metastatic intramammary lymph node in the posterior outer right breast just above the level of the nipple. 4. No evidence of malignancy on the left.   03/01/2022 Genetic Testing   Negative hereditary cancer genetic testing: no pathogenic variants detected in [facility] CustomNext-Cancer +RNAinsight Panel.  Variant of uncertain significance reported in BRIP1 at p.F934V (c.2800T>G). Report date is March 01, 2022.    The CustomNext-Cancer+RNAinsight panel offered by [facility] includes sequencing and rearrangement analysis for the following 47 genes:  APC, ATM, AXIN2, BARD1, BMPR1A, BRCA1, BRCA2, BRIP1, CDH1, CDK4, CDKN2A, CHEK2, DICER1, EPCAM, GREM1, HOXB13, MEN1, MLH1, MSH2, MSH3, MSH6, MUTYH, NBN, NF1, NF2, NTHL1, PALB2, PMS2, POLD1, POLE, PTEN, RAD51C, RAD51D, RECQL, RET, SDHA, SDHAF2, SDHB, SDHC, SDHD, SMAD4, SMARCA4, STK11, TP53, TSC1, TSC2, and  VHL.  RNA data is routinely analyzed for use in variant interpretation for all genes.   03/05/2022 Imaging   EXAM: CT CHEST, ABDOMEN, AND PELVIS WITH CONTRAST  IMPRESSION: 1. Asymmetric, masslike density of the glandular tissue of the right breast with overlying skin thickening, in keeping with known primary breast malignancy. 2. Enlarged, ill-defined right axillary lymph node containing a biopsy marking clip, consistent with known nodal metastatic disease. 3. No other evidence of lymphadenopathy or metastatic disease in the chest, abdomen, or pelvis. 4. Emphysema. Background of fine centrilobular nodularity, most concentrated in the lung apices, consistent with smoking-related respiratory bronchiolitis. 5. Aneurysm of the infrarenal abdominal aorta measuring up to 5.4 x 5.3 cm with a large burden of eccentric mural thrombus. Recommend follow-up CT/MR every 6 months and vascular consultation. This recommendation follows ACR consensus guidelines: White Paper of the ACR Incidental Findings Committee II on Vascular Findings. J Am Coll Radiol 2013; 10:789-794. 6. Coronary artery disease.   03/05/2022 Imaging   EXAM: NUCLEAR MEDICINE WHOLE BODY BONE SCAN  IMPRESSION: No evidence of bony metastatic disease.   03/07/2022 - 08/01/2022 Chemotherapy   Patient is on Treatment Plan : BREAST  Docetaxel + Carboplatin + Trastuzumab + Pertuzumab  (TCHP) q21d      07/26/2022 Surgery   Right mastectomy with axillary node seed guided excision converted to axillary node dissection with reconstruction by Drs. Donne Hazel and Thimmappa   07/26/2022 Pathology Results   FINAL MICROSCOPIC DIAGNOSIS:   A. LYMPH NODE, RIGHT AXILLARY TARGETED, EXCISION:  - Metastatic carcinoma involving one lymph node, 2 cm (1/1).  - Focal extranodal extension.  - Biopsy site and biopsy clip.   B. BREAST, RIGHT, MASTECTOMY:  - Invasive lobular carcinoma, 9.5 cm (ypT3).  - Carcinoma involves dermis of nipple.  -  All surgical  margins negative for carcinoma.  - One lymph node negative for metastatic carcinoma (0/1).  - Biopsy sites and biopsy clips.  - See oncology table.   C. LYMPH NODE, RIGHT AXILLARY, SENTINEL, EXCISION:  - One lymph node negative for metastatic carcinoma (0/1).   D. LYMPH NODE, RIGHT AXILLARY, SENTINEL, EXCISION:  - Metastatic carcinoma in one lymph node, 0.5 cm. (1/1).   E. LYMPH NODE, RIGHT AXILLARY, SENTINEL, EXCISION:  - One lymph node negative for metastatic carcinoma (0/1).    07/26/2022 Cancer Staging   Cancer Staging  Malignant neoplasm of overlapping sites of right breast in female, estrogen receptor positive ([place]) Staging form: Breast, AJCC 8th Edition - Clinical stage from 02/09/2022: Stage IIIA (cT3, cN1, cM0, G2, ER+, PR-, HER2+) - Signed by Truitt Merle, MD on 03/05/2022 Stage prefix: Initial diagnosis Histologic grading system: 3 grade system - Pathologic stage from 07/26/2022: Stage IIIA (pT3, pN1a, cM0, G2, ER+, PR-, HER2+) - Signed by Alla Feeling, NP on 08/01/2022 Histologic grading system: 3 grade system    07/26/2022 Cancer Staging   Staging form: Breast, AJCC 8th Edition - Pathologic stage from 07/26/2022: Stage IIIA (pT3, pN1a, cM0, G2, ER+, PR-, HER2+) - Signed by Alla Feeling, NP on 08/01/2022 Histologic grading system: 3 grade system   08/24/2022 -  Chemotherapy   Patient is on Treatment Plan : BREAST ADO-Trastuzumab Emtansine (Kadcyla) q21d        INTERVAL HISTORY:  Ashley Robertson is here for a follow up of breast cancer. She was last seen by me on 09/13/22. She presents to the clinic alone. She reports she is experiencing a lot of skin burning from radiation. She denies fatigue except from the premeds she receives with Kadcyla.   All other systems were reviewed with the patient and are negative.  MEDICAL HISTORY:  Past Medical History:  Diagnosis Date   Allergy    Lisinopril and lipitor   Anxiety and depression    Aortic atherosclerosis ([place])  03/2020   CT Chest: 2 V (LAD & LCx) Coronary Atherosclerosis, Aortic Atherosclerosis (no aneurysm).  Mild centrilobular emphysema with mild diffuse bronchial thickening; several scattered small solitary pulmonary nodules (largest 5.6 mm in anterior left upper lobe)   Breast cancer ([place]) 12/2021   Right breast ILC   Carotid artery plaque, bilateral 10/2015   Mild to moderate plaque L>R without significant stenosis   COPD (chronic obstructive pulmonary disease) (HCC)    Coronary Artery Calcification - Score 79    Coronary Calcium Score 79.  LAD and circumflex calcification noted.  Normal ascending aorta with mild calcification.   Current every day smoker    pt quit smoking April 2023   Emphysema lung [facility])    Noted on chest CT   Family history of breast cancer 02/21/2022   Family history of prostate cancer 02/21/2022   Hyperlipidemia    Hypertension    Controlled with amlodipine   Prediabetes    Skin cancer 2021   Remove 2022    SURGICAL HISTORY: Past Surgical History:  Procedure Laterality Date   BREAST BIOPSY Right    times 3   BREAST RECONSTRUCTION WITH PLACEMENT OF TISSUE EXPANDER AND ALLODERM Right 07/26/2022   Procedure: RIGHT BREAST RECONSTRUCTION WITH PLACEMENT OF TISSUE EXPANDER AND ALLODERM;  Surgeon: Irene Limbo, MD;  Location: [place];  Service: Plastics;  Laterality: Right;   [place]  NODE BIOPSY Right 07/26/2022   Procedure: RIGHT MASTECTOMY, RIGHT AXILLARY SENTINEL NODE BIOPSY;  Surgeon: Rolm Bookbinder, MD;  Location: [place];  Service: General;  Laterality: Right;  GEN & PEC BLOCK   PORTACATH PLACEMENT Left 03/06/2022   Procedure: INSERTION PORT-A-CATH;  Surgeon: Rolm Bookbinder, MD;  Location: [place];  Service: General;  Laterality: Left;   RADIOACTIVE SEED GUIDED AXILLARY SENTINEL LYMPH NODE Right 07/26/2022   Procedure: RADIOACTIVE SEED GUIDED AXILLARY  SENTINEL LYMPH NODE DISSECTION;  Surgeon: Rolm Bookbinder, MD;  Location: [place];  Service: General;  Laterality: Right;   [place] ECHOCARDIOGRAM  10/2016   EF 55 to 60%.  Normal systolic and diastolic function.  No ASD/PFO   TUBAL LIGATION  1991    I have reviewed the social history and family history with the patient and they are unchanged from previous note.  ALLERGIES:  is allergic to lisinopril and lipitor [atorvastatin].  MEDICATIONS:  Current Outpatient Medications  Medication Sig Dispense Refill   albuterol (PROVENTIL HFA) 108 (90 Base) MCG/ACT inhaler Inhale 1 to 2 puffs into the lungs every 6 hours as needed 8.5 g 2   amLODipine (NORVASC) 10 MG tablet Take 1 tablet (10 mg total) by mouth at bedtime. 90 tablet 3   aspirin 81 MG tablet Take 81 mg by mouth at bedtime.     Calcium Carb-Cholecalciferol (CALCIUM 500 + D3 PO) Take 1 tablet by mouth daily.     Cholecalciferol (VITAMIN D) 50 MCG (2000 UT) CAPS Take 2,000 Units by mouth daily.     FLUoxetine (PROZAC) 20 MG capsule Take 1 capsule (20 mg total) by mouth daily. 90 capsule 1   Fluticasone-Umeclidin-Vilant (TRELEGY ELLIPTA) 200-62.5-25 MCG/ACT AEPB Inhale 1 puff into the lungs daily. 14 each 0   Fluticasone-Umeclidin-Vilant (TRELEGY ELLIPTA) 200-62.5-25 MCG/ACT AEPB Inhale 1 puff into the lungs daily. 60 each 2   OVER THE COUNTER MEDICATION Take 1 capsule by mouth daily. Serrapeptase 120,000 units daily  Reported on 12/27/2015     rosuvastatin (CRESTOR) 5 MG tablet Take 1 tablet by mouth daily at bedtime ,except on Monday, Wednesday and Fridays take 2 tablets at bedtime as directed 180 tablet 3   Specialty Vitamins Products (MAGNESIUM, AMINO ACID CHELATE,) 133 MG tablet Take 1 tablet by mouth at bedtime. PURE magnesium supplement - 120 mg of mag per capsule     No current facility-administered medications for this visit.    PHYSICAL EXAMINATION: ECOG PERFORMANCE STATUS: 1 -  Symptomatic but completely ambulatory  Vitals:   10/22/22 0807  BP: (!) 142/85  Pulse: 64  Resp: 19  Temp: 97.8 F (36.6 C)  SpO2: 95%   Wt Readings from Last 3 Encounters:  10/22/22 161 lb 14.4 oz (73.4 kg)  10/03/22 163 lb (73.9 kg)  09/13/22 159 lb 4.8 oz (72.3 kg)     GENERAL:alert, no distress and comfortable SKIN: skin color, texture, turgor are normal, no rashes or significant lesions EYES: normal, Conjunctiva are pink and non-injected, sclera clear  NEURO: alert & oriented x 3 with fluent speech, no focal motor/sensory deficits BREAST: (+) diffuse radiation erythema, (+) skin erythema and shallow ulcers at upper breast and axilla     LABORATORY DATA:  I have reviewed the data as listed    Latest Ref Rng & Units 10/22/2022    7:40 AM 10/03/2022    7:43 AM 09/13/2022    7:34 AM  CBC  WBC 4.0 -  10.5 K/uL 4.6  5.0  4.1   Hemoglobin 12.0 - 15.0 g/dL 13.9  13.3  13.5   Hematocrit 36.0 - 46.0 % 40.8  39.7  40.0   Platelets 150 - 400 K/uL 182  173  227         Latest Ref Rng & Units 10/22/2022    7:40 AM 10/03/2022    7:43 AM 09/13/2022    7:34 AM  CMP  Glucose 70 - 99 mg/dL 114  111  115   BUN 8 - 23 mg/dL _0 Creatinine 0.44 - 1.00 mg/dL 0.63  0.72  0.70   Sodium 135 - 145 mmol/L 136  137  135   Potassium 3.5 - 5.1 mmol/L 4.0  4.2  3.9   Chloride 98 - 111 mmol/L 101  104  101   CO2 22 - 32 mmol/L _1 Calcium 8.9 - 10.3 mg/dL 9.6  9.2  8.9   Total Protein 6.5 - 8.1 g/dL 7.5  7.1  7.1   Total Bilirubin 0.3 - 1.2 mg/dL 0.4  0.4  0.4   Alkaline Phos 38 - 126 U/L 109  103  106   AST 15 - 41 U/L 23  24  35   ALT 0 - 44 U/L _2 RADIOGRAPHIC STUDIES: I have personally reviewed the radiological images as listed and agreed with the findings in the report. No results found.    Orders Placed This Encounter  Procedures   CBC with Differential ([place] Only)    Standing Status:   Future    Standing Expiration Date:    12/07/2023   CMP ([place] only)    Standing Status:   Future    Standing Expiration Date:   12/07/2023   All questions were answered. The patient knows to call the clinic with any problems, questions or concerns. No barriers to learning was detected. The total time spent in the appointment was 30 minutes.     Truitt Merle, MD 10/22/2022   I, Wilburn Mylar, am acting as scribe for Truitt Merle, MD.   I have reviewed the above documentation for accuracy and completeness, and I agree with the above.

## 2022-10-23 ENCOUNTER — Other Ambulatory Visit: Payer: Self-pay

## 2022-10-23 ENCOUNTER — Ambulatory Visit
Admission: RE | Admit: 2022-10-23 | Discharge: 2022-10-23 | Disposition: A | Discharge status: Home / Self Care | Payer: PPO | Source: Ambulatory Visit | Attending: Radiation Oncology | Admitting: Radiation Oncology

## 2022-10-23 ENCOUNTER — Ambulatory Visit: Payer: PPO

## 2022-10-23 DIAGNOSIS — Z510 Encounter for antineoplastic radiation therapy: Secondary | ICD-10-CM | POA: Diagnosis not present

## 2022-10-23 LAB — RAD ONC ARIA SESSION SUMMARY
Course Elapsed Days: 42
Plan Fractions Treated to Date: 4
Plan Prescribed Dose Per Fraction: 2 Gy
Plan Total Fractions Prescribed: 5
Plan Total Prescribed Dose: 10 Gy
Reference Point Dosage Given to Date: 8 Gy
Reference Point Session Dosage Given: 2 Gy
Session Number: 32

## 2022-10-24 ENCOUNTER — Encounter: Payer: Self-pay | Admitting: Radiation Oncology

## 2022-10-24 ENCOUNTER — Ambulatory Visit: Payer: PPO

## 2022-10-24 ENCOUNTER — Ambulatory Visit
Admission: RE | Admit: 2022-10-24 | Discharge: 2022-10-24 | Disposition: A | Discharge status: Home / Self Care | Payer: PPO | Source: Ambulatory Visit | Attending: Radiation Oncology | Admitting: Radiation Oncology

## 2022-10-24 ENCOUNTER — Other Ambulatory Visit: Payer: Self-pay

## 2022-10-24 DIAGNOSIS — Z510 Encounter for antineoplastic radiation therapy: Secondary | ICD-10-CM | POA: Diagnosis not present

## 2022-10-24 DIAGNOSIS — Z170 Estrogen receptor positive status [ER+]: Secondary | ICD-10-CM | POA: Diagnosis not present

## 2022-10-24 DIAGNOSIS — C50811 Malignant neoplasm of overlapping sites of right female breast: Secondary | ICD-10-CM | POA: Diagnosis not present

## 2022-10-24 LAB — RAD ONC ARIA SESSION SUMMARY
Course Elapsed Days: 43
Plan Fractions Treated to Date: 5
Plan Prescribed Dose Per Fraction: 2 Gy
Plan Total Fractions Prescribed: 5
Plan Total Prescribed Dose: 10 Gy
Reference Point Dosage Given to Date: 10 Gy
Reference Point Session Dosage Given: 2 Gy
Session Number: 33

## 2022-10-24 MED ORDER — RADIAPLEXRX EX GEL: Freq: Once | CUTANEOUS | Status: AC

## 2022-10-29 ENCOUNTER — Ambulatory Visit: Payer: PPO

## 2022-10-29 ENCOUNTER — Encounter: Payer: Self-pay | Admitting: Hematology

## 2022-10-29 ENCOUNTER — Ambulatory Visit: Payer: PPO | Attending: General Surgery

## 2022-10-29 VITALS — Wt 161.5 lb

## 2022-10-29 DIAGNOSIS — Z483 Aftercare following surgery for neoplasm: Secondary | ICD-10-CM | POA: Insufficient documentation

## 2022-10-29 NOTE — Therapy (Signed)
OUTPATIENT PHYSICAL THERAPY SO[facility] SCREENING NOTE   Patient Name: Ashley Robertson MRN: [id number] DOB:1956-04-10, 66 y.o., female Today's Date: 10/29/2022  PCP: Truitt Merle, MD REFERRING PROVIDER: Rolm Bookbinder, MD   PT End of Session - 10/29/22 1608     Visit Number 2   # unchanged due to screen only   PT Start Time 1607    PT Stop Time 1611    PT Time Calculation (min) 4 min    Activity Tolerance Patient tolerated treatment well    Behavior During Therapy [facility] for tasks assessed/performed             Past Medical History:  Diagnosis Date   Allergy    Lisinopril and lipitor   Anxiety and depression    Aortic atherosclerosis ([place]) 03/2020   CT Chest: 2 V (LAD & LCx) Coronary Atherosclerosis, Aortic Atherosclerosis (no aneurysm).  Mild centrilobular emphysema with mild diffuse bronchial thickening; several scattered small solitary pulmonary nodules (largest 5.6 mm in anterior left upper lobe)   Breast cancer ([place]) 12/2021   Right breast ILC   Carotid artery plaque, bilateral 10/2015   Mild to moderate plaque L>R without significant stenosis   COPD (chronic obstructive pulmonary disease) (HCC)    Coronary Artery Calcification - Score 79    Coronary Calcium Score 79.  LAD and circumflex calcification noted.  Normal ascending aorta with mild calcification.   Current every day smoker    pt quit smoking April 2023   Emphysema lung [facility])    Noted on chest CT   Family history of breast cancer 02/21/2022   Family history of prostate cancer 02/21/2022   Hyperlipidemia    Hypertension    Controlled with amlodipine   Prediabetes    Skin cancer 2021   Remove 2022   Past Surgical History:  Procedure Laterality Date   BREAST BIOPSY Right    times 3   BREAST RECONSTRUCTION WITH PLACEMENT OF TISSUE EXPANDER AND ALLODERM Right 07/26/2022   Procedure: RIGHT BREAST RECONSTRUCTION WITH PLACEMENT OF TISSUE EXPANDER AND ALLODERM;  Surgeon: Irene Limbo, MD;  Location: [place];   Service: Plastics;  Laterality: Right;   [place]   Rhinoplasty   MASTECTOMY W/ SENTINEL NODE BIOPSY Right 07/26/2022   Procedure: RIGHT MASTECTOMY, RIGHT AXILLARY SENTINEL NODE BIOPSY;  Surgeon: Rolm Bookbinder, MD;  Location: [place];  Service: General;  Laterality: Right;  GEN & PEC BLOCK   PORTACATH PLACEMENT Left 03/06/2022   Procedure: INSERTION PORT-A-CATH;  Surgeon: Rolm Bookbinder, MD;  Location: [place];  Service: General;  Laterality: Left;   RADIOACTIVE SEED GUIDED AXILLARY SENTINEL LYMPH NODE Right 07/26/2022   Procedure: RADIOACTIVE SEED GUIDED AXILLARY SENTINEL LYMPH NODE DISSECTION;  Surgeon: Rolm Bookbinder, MD;  Location: [place];  Service: General;  Laterality: Right;   [place] ECHOCARDIOGRAM  10/2016   EF 55 to 60%.  Normal systolic and diastolic function.  No ASD/PFO   TUBAL LIGATION  1991   Patient Active Problem List   Diagnosis Date Noted   Osteopenia 10/21/2022   Aortic atherosclerosis ([place]) 08/10/2022   S/P mastectomy, right 07/26/2022   Carotid atherosclerosis 03/28/2022   Coronary artery disease 03/28/2022   Pulmonary emphysema ([place]) 03/28/2022   Recurrent major depression in remission ([place]) 03/28/2022   Port-A-Cath in place 03/27/2022   Genetic testing 03/12/2022   Family history of breast cancer 02/21/2022   Family  history of prostate cancer 02/21/2022   Hypercholesterolemia 02/21/2022   Malignant neoplasm of overlapping sites of right breast in female, estrogen receptor positive ([place]) 02/16/2022   Bilateral carotid bruits 04/26/2020   Essential hypertension 03/19/2020   Hyperlipidemia with target LDL less than 100 03/19/2020   Prediabetes 03/19/2020   Agatston CAC score, <100 03/15/2020    REFERRING DIAG: Rt breast cancer at risk for lymphedema  THERAPY DIAG: Aftercare following surgery for neoplasm  PERTINENT HISTORY: Patient was  diagnosed on 02/08/2022 with right grade II invasive lobular carcinoma breast cancer. It measured 2.6 cm and 3.3 cm (2 masses) and is located in the upper outer quadrant. It is ER positive, PR negative, and HER2 positive with a Ki67 of 10%. Patient has COPD and hypertension and a 5cm AAA. She is-s/p right mastectomy  with SLNB 07/26/2022 by Dr. Donne Hazel and reconstruction by Dr. Iran Planas. Path showed 9.5 cm invasive lobular carcinoma involving dermis of nipple, margins negative, 2/5 positive lymph nodes. -given the significant residual disease, the recommendation is for postmastectomy radiation and switch to adjuvant Kadcyla  PRECAUTIONS: right UE Lymphedema risk, None  SUBJECTIVE: Pt returns for her first 3 month L-Dex screen.   PAIN:  Are you having pain? No  SOZO SCREENING: Patient was assessed today using the SOZO machine to determine the lymphedema index score. This was compared to her baseline score. It was determined that she is within the recommended range when compared to her baseline and no further action is needed at this time. She will continue SOZO screenings. These are done every 3 months for 2 years post operatively followed by every 6 months for 2 years, and then annually.   L-DEX FLOWSHEETS - 10/29/22 1600       L-DEX LYMPHEDEMA SCREENING   Measurement Type Unilateral    L-DEX MEASUREMENT EXTREMITY Upper Extremity    POSITION  Standing    DOMINANT SIDE Right    At Risk Side Right    BASELINE SCORE (UNILATERAL) -0.2    L-DEX SCORE (UNILATERAL) -7.7    VALUE CHANGE (UNILAT) -7.5               Otelia Limes, PTA 10/29/2022, 4:14 PM

## 2022-10-29 NOTE — Progress Notes (Signed)
                                                                                                                                                             Patient Name: Ashley [place] MRN: [id number] DOB: 11-19-1956 Referring Physician: Truitt Merle (Profile Not Attached) Date of Service: 10/24/2022 [facility] [place]                                                        End Of Treatment Note  Diagnoses: C50.811-Malignant neoplasm of overlapping sites of right female breast  Cancer Staging:  Stage IIA, cT2N1M0, grade 2 ER positive, HER2 amplified invasive lobular carcinoma of the right breast with residual disease following neoadjuvant chemotherapy.   Intent: Curative  Radiation Treatment Dates: 09/11/2022 through 10/24/2022 Site Technique Total Dose (Gy) Dose per Fx (Gy) Completed Fx Beam Energies  Chest Wall, Right: CW_R 3D 50.4/50.4 1.8 28/28 6XFFF  Chest Wall, Right: CW_R_SCLV 3D 50.4/50.4 1.8 28/28 6X, 10X  Chest Wall, Right: CW_R_Bst Electron 10/10 2 5/5 6E   Narrative: The patient tolerated radiation therapy relatively well. She did not have fatigue during treatment but did have some discomfort in the area of moist desquamation noted in the axilla at the conclusion of therapy.  Plan: The patient will receive a call in about one month from the radiation oncology department. She will continue follow up with Dr. Burr Medico as well.  ________________________________________________    Carola Rhine, [facility]

## 2022-10-30 ENCOUNTER — Encounter: Payer: Self-pay | Admitting: *Deleted

## 2022-10-30 ENCOUNTER — Ambulatory Visit: Payer: PPO

## 2022-10-31 ENCOUNTER — Other Ambulatory Visit (HOSPITAL_COMMUNITY): Payer: Self-pay

## 2022-10-31 ENCOUNTER — Ambulatory Visit: Payer: PPO

## 2022-11-01 ENCOUNTER — Other Ambulatory Visit (HOSPITAL_COMMUNITY): Payer: Self-pay

## 2022-11-09 ENCOUNTER — Telehealth: Payer: Self-pay | Admitting: Pharmacist

## 2022-11-09 NOTE — Telephone Encounter (Signed)
PA for Repatha submitted. Key: [id number]

## 2022-11-09 NOTE — Telephone Encounter (Signed)
Received fax with PA approval for Repatha through 04/10/23.

## 2022-11-14 ENCOUNTER — Inpatient Hospital Stay: Payer: PPO | Attending: Hematology

## 2022-11-14 ENCOUNTER — Inpatient Hospital Stay: Payer: PPO

## 2022-11-14 VITALS — BP 143/73 | HR 62 | Temp 98.2°F | Resp 18 | Wt 162.0 lb

## 2022-11-14 DIAGNOSIS — Z79899 Other long term (current) drug therapy: Secondary | ICD-10-CM | POA: Diagnosis not present

## 2022-11-14 DIAGNOSIS — C50811 Malignant neoplasm of overlapping sites of right female breast: Secondary | ICD-10-CM

## 2022-11-14 DIAGNOSIS — M8580 Other specified disorders of bone density and structure, unspecified site: Secondary | ICD-10-CM | POA: Insufficient documentation

## 2022-11-14 DIAGNOSIS — I10 Essential (primary) hypertension: Secondary | ICD-10-CM | POA: Diagnosis not present

## 2022-11-14 DIAGNOSIS — Z170 Estrogen receptor positive status [ER+]: Secondary | ICD-10-CM | POA: Diagnosis not present

## 2022-11-14 DIAGNOSIS — Z5112 Encounter for antineoplastic immunotherapy: Secondary | ICD-10-CM | POA: Diagnosis not present

## 2022-11-14 DIAGNOSIS — Z9011 Acquired absence of right breast and nipple: Secondary | ICD-10-CM | POA: Insufficient documentation

## 2022-11-14 DIAGNOSIS — Z95828 Presence of other vascular implants and grafts: Secondary | ICD-10-CM

## 2022-11-14 LAB — CMP (CANCER CENTER ONLY)
ALT: 18 U/L (ref 0–44)
AST: 24 U/L (ref 15–41)
Albumin: 3.7 g/dL (ref 3.5–5.0)
Alkaline Phosphatase: 127 U/L — ABNORMAL HIGH (ref 38–126)
Anion gap: 7 (ref 5–15)
BUN: 11 mg/dL (ref 8–23)
CO2: 27 mmol/L (ref 22–32)
Calcium: 9.7 mg/dL (ref 8.9–10.3)
Chloride: 99 mmol/L (ref 98–111)
Creatinine: 0.67 mg/dL (ref 0.44–1.00)
GFR, Estimated: >60 mL/min (ref >60)
Glucose, Bld: 107 mg/dL — ABNORMAL HIGH (ref 70–99)
Potassium: 4.1 mmol/L (ref 3.5–5.1)
Sodium: 133 mmol/L — ABNORMAL LOW (ref 135–145)
Total Bilirubin: 0.5 mg/dL (ref 0.3–1.2)
Total Protein: 7.1 g/dL (ref 6.5–8.1)

## 2022-11-14 LAB — CBC WITH DIFFERENTIAL (CANCER CENTER ONLY)
Abs Immature Granulocytes: 0.03 10*3/uL (ref 0.00–0.07)
Basophils Absolute: 0.1 10*3/uL (ref 0.0–0.1)
Basophils Relative: 1 %
Eosinophils Absolute: 0.2 10*3/uL (ref 0.0–0.5)
Eosinophils Relative: 3 %
HCT: 39.5 % (ref 36.0–46.0)
Hemoglobin: 13.5 g/dL (ref 12.0–15.0)
Immature Granulocytes: 1 %
Lymphocytes Relative: 18 %
Lymphs Abs: 0.9 10*3/uL (ref 0.7–4.0)
MCH: 31.2 pg (ref 26.0–34.0)
MCHC: 34.2 g/dL (ref 30.0–36.0)
MCV: 91.2 fL (ref 80.0–100.0)
Monocytes Absolute: 0.6 10*3/uL (ref 0.1–1.0)
Monocytes Relative: 12 %
Neutro Abs: 3.5 10*3/uL (ref 1.7–7.7)
Neutrophils Relative %: 65 %
Platelet Count: 221 10*3/uL (ref 150–400)
RBC: 4.33 MIL/uL (ref 3.87–5.11)
RDW: 14.7 % (ref 11.5–15.5)
WBC Count: 5.3 10*3/uL (ref 4.0–10.5)
nRBC: 0 % (ref 0.0–0.2)

## 2022-11-14 MED ORDER — SODIUM CHLORIDE 0.9 % IV SOLN
3.6000 mg/kg | Freq: Once | INTRAVENOUS | Status: AC
  Administered 2022-11-14: 260 mg via INTRAVENOUS
  Filled 2022-11-14: qty 8

## 2022-11-14 MED ORDER — CETIRIZINE HCL 10 MG/ML IV SOLN
10.0000 mg | Freq: Once | INTRAVENOUS | Status: AC
  Administered 2022-11-14: 10 mg via INTRAVENOUS
  Filled 2022-11-14: qty 1

## 2022-11-14 MED ORDER — PROCHLORPERAZINE MALEATE 10 MG PO TABS
10.0000 mg | ORAL_TABLET | Freq: Once | ORAL | Status: AC
  Administered 2022-11-14: 10 mg via ORAL
  Filled 2022-11-14: qty 1

## 2022-11-14 MED ORDER — SODIUM CHLORIDE 0.9 % IV SOLN: Freq: Once | INTRAVENOUS | Status: AC

## 2022-11-14 MED ORDER — SODIUM CHLORIDE 0.9% FLUSH
10.0000 mL | Freq: Once | INTRAVENOUS | Status: AC
  Administered 2022-11-14: 10 mL

## 2022-11-14 MED ORDER — ACETAMINOPHEN 325 MG PO TABS
650.0000 mg | ORAL_TABLET | Freq: Once | ORAL | Status: AC
  Administered 2022-11-14: 650 mg via ORAL
  Filled 2022-11-14: qty 2

## 2022-11-29 ENCOUNTER — Other Ambulatory Visit: Payer: Self-pay | Admitting: Hematology

## 2022-11-29 ENCOUNTER — Other Ambulatory Visit: Payer: Self-pay

## 2022-11-29 ENCOUNTER — Other Ambulatory Visit (HOSPITAL_COMMUNITY): Payer: Self-pay

## 2022-11-29 MED ORDER — VITAMIN D 50 MCG (2000 UT) PO CAPS
2000.0000 [IU] | ORAL_CAPSULE | Freq: Every day | ORAL | 2 refills | Status: DC
  Filled 2022-11-29 – 2023-01-27 (×2): qty 30, 30d supply, fill #0

## 2022-12-04 ENCOUNTER — Other Ambulatory Visit (HOSPITAL_COMMUNITY): Payer: Self-pay

## 2022-12-05 NOTE — Assessment & Plan Note (Signed)
-  Her most recent DEXA was on 04/29/20 showing osteopenia (T-score of -2.1 at L1-3). -continue calcium and vitD  -We discussed the negative impact on bone density from aromatase inhibitor -I recommend her to consider Zometa infusion every 6 months for 2 years, benefit and side effect discussed with her.

## 2022-12-05 NOTE — Assessment & Plan Note (Addendum)
invasive lobular carcinoma, Stage IIIA, c(T3, N1), yp(T3, N1a), ER+/PR-/HER2+, Grade 2  -Diagnosed in 01/2022 -s/p neoadjuvant chemo TCHP X6, followed by right mastectomy by Dr. Donne Hazel and reconstruction by Dr. Iran Planas. Unfortunately she did not have much response to neoadjvuant chemo  -on adjuvant Kadcyla now, tolerating well with no noticeable side effects. Plan for one year treatment, through 08/2023. -she is also receiving postmastectomy radiation under Dr. Lisbeth Renshaw, 10/10 - 10/29/22. -I recommend adjuvant AI. The potential benefit and side effects, which includes but not limited to, hot flash, skin and vaginal dryness, metabolic changes ( increased blood glucose, cholesterol, weight, etc.), slightly in increased risk of cardiovascular disease, cataracts, muscular and joint discomfort, osteopenia and osteoporosis, etc, were discussed with her in great details. She is interested, and I will call in anastrozole today  -she is due to echo later this month

## 2022-12-06 ENCOUNTER — Other Ambulatory Visit: Payer: Self-pay

## 2022-12-06 ENCOUNTER — Telehealth: Payer: Self-pay | Admitting: Hematology

## 2022-12-06 ENCOUNTER — Inpatient Hospital Stay: Payer: PPO

## 2022-12-06 ENCOUNTER — Other Ambulatory Visit (HOSPITAL_COMMUNITY): Payer: Self-pay

## 2022-12-06 ENCOUNTER — Inpatient Hospital Stay: Payer: PPO | Attending: Hematology | Admitting: Hematology

## 2022-12-06 ENCOUNTER — Encounter: Payer: Self-pay | Admitting: Hematology

## 2022-12-06 VITALS — BP 128/87 | HR 59 | Temp 97.9°F | Resp 18

## 2022-12-06 VITALS — BP 150/85 | HR 61 | Temp 97.8°F | Resp 18 | Ht 69.0 in | Wt 161.2 lb

## 2022-12-06 DIAGNOSIS — C50811 Malignant neoplasm of overlapping sites of right female breast: Secondary | ICD-10-CM | POA: Diagnosis not present

## 2022-12-06 DIAGNOSIS — Z9011 Acquired absence of right breast and nipple: Secondary | ICD-10-CM | POA: Diagnosis not present

## 2022-12-06 DIAGNOSIS — M8588 Other specified disorders of bone density and structure, other site: Secondary | ICD-10-CM | POA: Diagnosis not present

## 2022-12-06 DIAGNOSIS — Z79899 Other long term (current) drug therapy: Secondary | ICD-10-CM | POA: Insufficient documentation

## 2022-12-06 DIAGNOSIS — Z7982 Long term (current) use of aspirin: Secondary | ICD-10-CM | POA: Insufficient documentation

## 2022-12-06 DIAGNOSIS — Z79811 Long term (current) use of aromatase inhibitors: Secondary | ICD-10-CM | POA: Insufficient documentation

## 2022-12-06 DIAGNOSIS — Z1231 Encounter for screening mammogram for malignant neoplasm of breast: Secondary | ICD-10-CM | POA: Diagnosis not present

## 2022-12-06 DIAGNOSIS — Z5112 Encounter for antineoplastic immunotherapy: Secondary | ICD-10-CM | POA: Diagnosis not present

## 2022-12-06 DIAGNOSIS — Z170 Estrogen receptor positive status [ER+]: Secondary | ICD-10-CM | POA: Insufficient documentation

## 2022-12-06 DIAGNOSIS — C773 Secondary and unspecified malignant neoplasm of axilla and upper limb lymph nodes: Secondary | ICD-10-CM | POA: Diagnosis not present

## 2022-12-06 DIAGNOSIS — Z95828 Presence of other vascular implants and grafts: Secondary | ICD-10-CM

## 2022-12-06 LAB — CBC WITH DIFFERENTIAL (CANCER CENTER ONLY)
Abs Immature Granulocytes: 0.02 10*3/uL (ref 0.00–0.07)
Basophils Absolute: 0.1 10*3/uL (ref 0.0–0.1)
Basophils Relative: 2 %
Eosinophils Absolute: 0.1 10*3/uL (ref 0.0–0.5)
Eosinophils Relative: 3 %
HCT: 39.7 % (ref 36.0–46.0)
Hemoglobin: 13.7 g/dL (ref 12.0–15.0)
Immature Granulocytes: 0 %
Lymphocytes Relative: 21 %
Lymphs Abs: 0.9 10*3/uL (ref 0.7–4.0)
MCH: 31.6 pg (ref 26.0–34.0)
MCHC: 34.5 g/dL (ref 30.0–36.0)
MCV: 91.7 fL (ref 80.0–100.0)
Monocytes Absolute: 0.6 10*3/uL (ref 0.1–1.0)
Monocytes Relative: 12 %
Neutro Abs: 2.8 10*3/uL (ref 1.7–7.7)
Neutrophils Relative %: 62 %
Platelet Count: 178 10*3/uL (ref 150–400)
RBC: 4.33 MIL/uL (ref 3.87–5.11)
RDW: 15.3 % (ref 11.5–15.5)
WBC Count: 4.5 10*3/uL (ref 4.0–10.5)
nRBC: 0 % (ref 0.0–0.2)

## 2022-12-06 LAB — CMP (CANCER CENTER ONLY)
ALT: 19 U/L (ref 0–44)
AST: 26 U/L (ref 15–41)
Albumin: 3.8 g/dL (ref 3.5–5.0)
Alkaline Phosphatase: 126 U/L (ref 38–126)
Anion gap: 7 (ref 5–15)
BUN: 14 mg/dL (ref 8–23)
CO2: 28 mmol/L (ref 22–32)
Calcium: 9.4 mg/dL (ref 8.9–10.3)
Chloride: 100 mmol/L (ref 98–111)
Creatinine: 0.62 mg/dL (ref 0.44–1.00)
GFR, Estimated: >60 mL/min (ref >60)
Glucose, Bld: 93 mg/dL (ref 70–99)
Potassium: 3.6 mmol/L (ref 3.5–5.1)
Sodium: 135 mmol/L (ref 135–145)
Total Bilirubin: 0.5 mg/dL (ref 0.3–1.2)
Total Protein: 7 g/dL (ref 6.5–8.1)

## 2022-12-06 MED ORDER — ANASTROZOLE 1 MG PO TABS
1.0000 mg | ORAL_TABLET | Freq: Every day | ORAL | 2 refills | Status: DC
  Filled 2022-12-06: qty 30, 30d supply, fill #0
  Filled 2022-12-26 – 2022-12-31 (×2): qty 30, 30d supply, fill #1
  Filled 2023-01-27: qty 30, 30d supply, fill #2

## 2022-12-06 MED ORDER — CETIRIZINE HCL 10 MG/ML IV SOLN
10.0000 mg | Freq: Once | INTRAVENOUS | Status: AC
  Administered 2022-12-06: 10 mg via INTRAVENOUS
  Filled 2022-12-06: qty 1

## 2022-12-06 MED ORDER — PROCHLORPERAZINE MALEATE 10 MG PO TABS
10.0000 mg | ORAL_TABLET | Freq: Once | ORAL | Status: AC
  Administered 2022-12-06: 10 mg via ORAL
  Filled 2022-12-06: qty 1

## 2022-12-06 MED ORDER — ACETAMINOPHEN 325 MG PO TABS
650.0000 mg | ORAL_TABLET | Freq: Once | ORAL | Status: AC
  Administered 2022-12-06: 650 mg via ORAL
  Filled 2022-12-06: qty 2

## 2022-12-06 MED ORDER — SODIUM CHLORIDE 0.9 % IV SOLN
3.6000 mg/kg | Freq: Once | INTRAVENOUS | Status: AC
  Administered 2022-12-06: 260 mg via INTRAVENOUS
  Filled 2022-12-06: qty 8

## 2022-12-06 MED ORDER — SODIUM CHLORIDE 0.9% FLUSH
10.0000 mL | Freq: Once | INTRAVENOUS | Status: AC
  Administered 2022-12-06: 10 mL

## 2022-12-06 MED ORDER — SODIUM CHLORIDE 0.9 % IV SOLN: Freq: Once | INTRAVENOUS | Status: AC

## 2022-12-06 MED ORDER — HEPARIN SOD (PORK) LOCK FLUSH 100 UNIT/ML IV SOLN
500.0000 [IU] | Freq: Once | INTRAVENOUS | Status: AC | PRN
  Administered 2022-12-06: 500 [IU]

## 2022-12-06 MED ORDER — SODIUM CHLORIDE 0.9% FLUSH
10.0000 mL | INTRAVENOUS | Status: DC | PRN
  Administered 2022-12-06: 10 mL

## 2022-12-06 NOTE — Telephone Encounter (Signed)
Patient got updated calendar while in infusion  

## 2022-12-06 NOTE — Progress Notes (Signed)
Ashley Robertson   [phone] [phone]   Clinic Follow up Note   Patient Care Team: Truitt Merle, MD as PCP - General (Hematology) Leonie Man, MD as PCP - Cardiology (Cardiology) Mauro Kaufmann, RN as Oncology Nurse Navigator Rockwell Germany, RN as Oncology Nurse Navigator Rolm Bookbinder, MD as Consulting Physician (General Surgery) Truitt Merle, MD as Consulting Physician (Hematology) Kyung Rudd, MD as Consulting Physician (Radiation Oncology)  Date of Service:  12/06/2022  CHIEF COMPLAINT: f/u of  right breast cancer   CURRENT THERAPY:  Kadcyla, q21d, starting 08/24/22  -post-mastectomy radiation, 09/11/22 - 10/24/22     ASSESSMENT:  Ashley Robertson is a 67 y.o. female with   Malignant neoplasm of overlapping sites of right breast in female, estrogen receptor positive ([place])  invasive lobular carcinoma, Stage IIIA, c(T3, N1), yp(T3, N1a), ER+/PR-/HER2+, Grade 2  -Diagnosed in 01/2022 -s/p neoadjuvant chemo TCHP X6, followed by right mastectomy by Dr. Donne Hazel and reconstruction by Dr. Iran Planas. Unfortunately she did not have much response to neoadjvuant chemo  -on adjuvant Kadcyla now, tolerating well with no noticeable side effects. Plan for one year treatment, through 08/2023. -she is also receiving postmastectomy radiation under Dr. Lisbeth Renshaw, 10/10 - 10/29/22. -I recommend adjuvant AI. The potential benefit and side effects, which includes but not limited to, hot flash, skin and vaginal dryness, metabolic changes ( increased blood glucose, cholesterol, weight, etc.), slightly in increased risk of cardiovascular disease, cataracts, muscular and joint discomfort, osteopenia and osteoporosis, etc, were discussed with her in great details. She is interested, and I will call in anastrozole today  -she is due to echo later this month    Osteopenia -Her most recent DEXA was on 04/29/20 showing osteopenia (T-score of -2.1 at L1-3). -continue  calcium and vitD  -We discussed the negative impact on bone density from aromatase inhibitor -I recommend her to consider Zometa infusion every 6 months for 2 years, benefit and side effect discussed with her. She will think about it    PLAN: -lab reviewed -proceed with  C6 Kadcyla today  -Discuss antiestrogen Anastrozole, I called in today  -Mammogram in March -lab/flush,f/u Kadcyla in 3 weeks -echo in late Jan     SUMMARY OF ONCOLOGIC HISTORY: Oncology History Overview Note   Cancer Staging  Malignant neoplasm of overlapping sites of right breast in female, estrogen receptor positive ([place]) Staging form: Breast, AJCC 8th Edition - Clinical stage from 02/09/2022: Stage IIA (cT2, cN1, cM0, G2, ER+, PR-, HER2+) - Signed by Truitt Merle, MD on 02/20/2022    Malignant neoplasm of overlapping sites of right breast in female, estrogen receptor positive ([place])  02/08/2022 Mammogram   CLINICAL DATA:  Acute onset right breast lump.  EXAM: DIGITAL DIAGNOSTIC UNILATERAL RIGHT MAMMOGRAM WITH TOMOSYNTHESIS AND CAD; ULTRASOUND RIGHT BREAST LIMITED  IMPRESSION: 1. Highly suspicious findings mammographically and sonographically. I suspect there is significant malignancy throughout most of the right breast. Discrete masses are seen at 11 o'clock and 6 o'clock.  A single borderline lymph node is identified. This lymph node appears prominent compared to the remainder of the lymph nodes. The skin thickening suggests the possibility of inflammatory breast cancer.   02/09/2022 Cancer Staging   Staging form: Breast, AJCC 8th Edition - Clinical stage from 02/09/2022: Stage IIIA (cT3, cN1, cM0, G2, ER+, PR-, HER2+) - Signed by Truitt Merle, MD on 03/05/2022 Stage prefix: Initial diagnosis Histologic grading system: 3 grade system   02/09/2022 Initial Biopsy   Diagnosis 1. Breast,  right, needle core biopsy, right breast 11 o'clock mass ribbon clip - INVASIVE MAMMARY CARCINOMA - SEE COMMENT 2. Breast, right,  needle core biopsy, right breast 6'oclock mass, coil clip - INVASIVE MAMMARY CARCINOMA - SEE COMMENT 3. Lymph node, needle/core biopsy, right axillary lymph node, tribell clip - METASTATIC CARCINOMA INVOLVING NODAL TISSUE Microscopic Comment 1. and 2. The biopsy material shows an infiltrative proliferation of cells arranged linearly and in small clusters. Based on the biopsy, the carcinoma appears Nottingham grade 2 of 3 and measures 1.5 cm in greatest linear extent.  Addendum parts 1 and 2: Immunohistochemistry for E-cadherin is negative consistent with lobular carcinoma.  2. PROGNOSTIC INDICATORS Results: The tumor cells are EQUIVOCAL for Her2 (2+). Her2 by FISH will be performed and the results reported separately. Estrogen Receptor: 100%, POSITIVE, STRONG STAINING INTENSITY Progesterone Receptor: <1%, NEGATIVE Proliferation Marker Ki67: 10%  2. FLUORESCENCE IN-SITU HYBRIDIZATION Results: GROUP 1: HER2 **POSITIVE**   3. PROGNOSTIC INDICATORS Results: By immunohistochemistry, the tumor cells are POSITIVE for Her2 (3+). Estrogen Receptor: 100%, POSITIVE, STRONG STAINING INTENSITY Progesterone Receptor: <1%, NEGATIVE   02/16/2022 Initial Diagnosis   Malignant neoplasm of overlapping sites of right breast in female, estrogen receptor positive ([place])   03/01/2022 Imaging   EXAM: BILATERAL BREAST MRI WITH AND WITHOUT CONTRAST  IMPRESSION: 1. 8.5 x 8.3 x 5.9 cm area of confluent mass-like enhancement in the right breast involving all 4 quadrants and containing 2 biopsy marker clip artifacts, compatible with 4 quadrant biopsy-proven malignancy. 2. Biopsy-proven metastatic lymph node in the right axilla. 3. Possible metastatic intramammary lymph node in the posterior outer right breast just above the level of the nipple. 4. No evidence of malignancy on the left.   03/01/2022 Genetic Testing   Negative hereditary cancer genetic testing: no pathogenic variants detected in [facility]  CustomNext-Cancer +RNAinsight Panel.  Variant of uncertain significance reported in BRIP1 at p.F934V (c.2800T>G). Report date is March 01, 2022.    The CustomNext-Cancer+RNAinsight panel offered by [facility] includes sequencing and rearrangement analysis for the following 47 genes:  APC, ATM, AXIN2, BARD1, BMPR1A, BRCA1, BRCA2, BRIP1, CDH1, CDK4, CDKN2A, CHEK2, DICER1, EPCAM, GREM1, HOXB13, MEN1, MLH1, MSH2, MSH3, MSH6, MUTYH, NBN, NF1, NF2, NTHL1, PALB2, PMS2, POLD1, POLE, PTEN, RAD51C, RAD51D, RECQL, RET, SDHA, SDHAF2, SDHB, SDHC, SDHD, SMAD4, SMARCA4, STK11, TP53, TSC1, TSC2, and VHL.  RNA data is routinely analyzed for use in variant interpretation for all genes.   03/05/2022 Imaging   EXAM: CT CHEST, ABDOMEN, AND PELVIS WITH CONTRAST  IMPRESSION: 1. Asymmetric, masslike density of the glandular tissue of the right breast with overlying skin thickening, in keeping with known primary breast malignancy. 2. Enlarged, ill-defined right axillary lymph node containing a biopsy marking clip, consistent with known nodal metastatic disease. 3. No other evidence of lymphadenopathy or metastatic disease in the chest, abdomen, or pelvis. 4. Emphysema. Background of fine centrilobular nodularity, most concentrated in the lung apices, consistent with smoking-related respiratory bronchiolitis. 5. Aneurysm of the infrarenal abdominal aorta measuring up to 5.4 x 5.3 cm with a large burden of eccentric mural thrombus. Recommend follow-up CT/MR every 6 months and vascular consultation. This recommendation follows ACR consensus guidelines: White Paper of the ACR Incidental Findings Committee II on Vascular Findings. J Am Coll Radiol 2013; 10:789-794. 6. Coronary artery disease.   03/05/2022 Imaging   EXAM: NUCLEAR MEDICINE WHOLE BODY BONE SCAN  IMPRESSION: No evidence of bony metastatic disease.   03/07/2022 - 08/01/2022 Chemotherapy   Patient is on Treatment Plan : BREAST  Docetaxel + Carboplatin +  Trastuzumab + Pertuzumab  (TCHP) q21d      07/26/2022 Surgery   Right mastectomy with axillary node seed guided excision converted to axillary node dissection with reconstruction by Drs. Donne Hazel and Thimmappa   07/26/2022 Pathology Results   FINAL MICROSCOPIC DIAGNOSIS:   A. LYMPH NODE, RIGHT AXILLARY TARGETED, EXCISION:  - Metastatic carcinoma involving one lymph node, 2 cm (1/1).  - Focal extranodal extension.  - Biopsy site and biopsy clip.   B. BREAST, RIGHT, MASTECTOMY:  - Invasive lobular carcinoma, 9.5 cm (ypT3).  - Carcinoma involves dermis of nipple.  - All surgical margins negative for carcinoma.  - One lymph node negative for metastatic carcinoma (0/1).  - Biopsy sites and biopsy clips.  - See oncology table.   C. LYMPH NODE, RIGHT AXILLARY, SENTINEL, EXCISION:  - One lymph node negative for metastatic carcinoma (0/1).   D. LYMPH NODE, RIGHT AXILLARY, SENTINEL, EXCISION:  - Metastatic carcinoma in one lymph node, 0.5 cm. (1/1).   E. LYMPH NODE, RIGHT AXILLARY, SENTINEL, EXCISION:  - One lymph node negative for metastatic carcinoma (0/1).    07/26/2022 Cancer Staging   Cancer Staging  Malignant neoplasm of overlapping sites of right breast in female, estrogen receptor positive ([place]) Staging form: Breast, AJCC 8th Edition - Clinical stage from 02/09/2022: Stage IIIA (cT3, cN1, cM0, G2, ER+, PR-, HER2+) - Signed by Truitt Merle, MD on 03/05/2022 Stage prefix: Initial diagnosis Histologic grading system: 3 grade system - Pathologic stage from 07/26/2022: Stage IIIA (pT3, pN1a, cM0, G2, ER+, PR-, HER2+) - Signed by Alla Feeling, NP on 08/01/2022 Histologic grading system: 3 grade system    07/26/2022 Cancer Staging   Staging form: Breast, AJCC 8th Edition - Pathologic stage from 07/26/2022: Stage IIIA (pT3, pN1a, cM0, G2, ER+, PR-, HER2+) - Signed by Alla Feeling, NP on 08/01/2022 Histologic grading system: 3 grade system   08/24/2022 -  Chemotherapy   Patient is on  Treatment Plan : BREAST ADO-Trastuzumab Emtansine (Kadcyla) q21d        INTERVAL HISTORY:  Ashley Robertson is here for a follow up of  right breast cancer She was last seen by me on 10/22/22 She presents to the clinic alone. Pt states she has no problems. She reports she has some numbness in her fingers,but not all the time. Pt reports her appetite is good. Pt has no questions overall is doing well.    All other systems were reviewed with the patient and are negative.  MEDICAL HISTORY:  Past Medical History:  Diagnosis Date   Allergy    Lisinopril and lipitor   Anxiety and depression    Aortic atherosclerosis ([place]) 03/2020   CT Chest: 2 V (LAD & LCx) Coronary Atherosclerosis, Aortic Atherosclerosis (no aneurysm).  Mild centrilobular emphysema with mild diffuse bronchial thickening; several scattered small solitary pulmonary nodules (largest 5.6 mm in anterior left upper lobe)   Breast cancer ([place]) 12/2021   Right breast ILC   Carotid artery plaque, bilateral 10/2015   Mild to moderate plaque L>R without significant stenosis   COPD (chronic obstructive pulmonary disease) ([facility])    Coronary Artery Calcification - Score 79    Coronary Calcium Score 79.  LAD and circumflex calcification noted.  Normal ascending aorta with mild calcification.   Current every day smoker    pt quit smoking April 2023   Emphysema lung [facility])    Noted on chest CT   Family history of breast cancer 02/21/2022  Family history of prostate cancer 02/21/2022   Hyperlipidemia    Hypertension    Controlled with amlodipine   Prediabetes    Skin cancer 2021   Remove 2022    SURGICAL HISTORY: Past Surgical History:  Procedure Laterality Date   BREAST BIOPSY Right    times 3   BREAST RECONSTRUCTION WITH PLACEMENT OF TISSUE EXPANDER AND ALLODERM Right 07/26/2022   Procedure: RIGHT BREAST RECONSTRUCTION WITH PLACEMENT OF TISSUE EXPANDER AND ALLODERM;  Surgeon: Irene Limbo, MD;  Location: [facility];  Service:  Plastics;  Laterality: Right;   [place]   Rhinoplasty   MASTECTOMY W/ SENTINEL NODE BIOPSY Right 07/26/2022   Procedure: RIGHT MASTECTOMY, RIGHT AXILLARY SENTINEL NODE BIOPSY;  Surgeon: Rolm Bookbinder, MD;  Location: [facility];  Service: General;  Laterality: Right;  GEN & PEC BLOCK   PORTACATH PLACEMENT Left 03/06/2022   Procedure: INSERTION PORT-A-CATH;  Surgeon: Rolm Bookbinder, MD;  Location: [facility];  Service: General;  Laterality: Left;   RADIOACTIVE SEED GUIDED AXILLARY SENTINEL LYMPH NODE Right 07/26/2022   Procedure: RADIOACTIVE SEED GUIDED AXILLARY SENTINEL LYMPH NODE DISSECTION;  Surgeon: Rolm Bookbinder, MD;  Location: [facility];  Service: General;  Laterality: Right;   [place] ECHOCARDIOGRAM  10/2016   EF 55 to 60%.  Normal systolic and diastolic function.  No ASD/PFO   TUBAL LIGATION  1991    I have reviewed the social history and family history with the patient and they are unchanged from previous note.  ALLERGIES:  is allergic to lisinopril and lipitor [atorvastatin].  MEDICATIONS:  Current Outpatient Medications  Medication Sig Dispense Refill   anastrozole (ARIMIDEX) 1 MG tablet Take 1 tablet (1 mg total) by mouth daily. 30 tablet 2   albuterol (PROVENTIL HFA) 108 (90 Base) MCG/ACT inhaler Inhale 1 to 2 puffs into the lungs every 6 hours as needed 8.5 g 2   amLODipine (NORVASC) 10 MG tablet Take 1 tablet (10 mg total) by mouth at bedtime. 90 tablet 3   aspirin 81 MG tablet Take 81 mg by mouth at bedtime.     Calcium Carb-Cholecalciferol (CALCIUM 500 + D3 PO) Take 1 tablet by mouth daily.     Cholecalciferol (VITAMIN D) 50 MCG (2000 UT) CAPS Take 1 capsule (2,000 Units total) by mouth daily. 30 capsule 2   FLUoxetine (PROZAC) 20 MG capsule Take 1 capsule (20 mg total) by mouth daily. 90 capsule 1   Fluticasone-Umeclidin-Vilant (TRELEGY ELLIPTA) 200-62.5-25  MCG/ACT AEPB Inhale 1 puff into the lungs daily. 14 each 0   Fluticasone-Umeclidin-Vilant (TRELEGY ELLIPTA) 200-62.5-25 MCG/ACT AEPB Inhale 1 puff into the lungs daily. 60 each 2   OVER THE COUNTER MEDICATION Take 1 capsule by mouth daily. Serrapeptase 120,000 units daily  Reported on 12/27/2015     rosuvastatin (CRESTOR) 5 MG tablet Take 1 tablet by mouth daily at bedtime ,except on Monday, Wednesday and Fridays take 2 tablets at bedtime as directed 180 tablet 3   Specialty Vitamins Products (MAGNESIUM, AMINO ACID CHELATE,) 133 MG tablet Take 1 tablet by mouth at bedtime. PURE magnesium supplement - 120 mg of mag per capsule     No current facility-administered medications for this visit.   Facility-Administered Medications Ordered in Other Visits  Medication Dose Route Frequency Provider Last Rate Last Admin   sodium chloride flush (NS) 0.9 % injection 10 mL  10 mL Intracatheter PRN Truitt Merle,  MD   10 mL at 12/06/22 1542    PHYSICAL EXAMINATION: ECOG PERFORMANCE STATUS: 0 - Asymptomatic  Vitals:   12/06/22 1255  BP: (!) 150/85  Pulse: 61  Resp: 18  Temp: 97.8 F (36.6 C)  SpO2: 96%   Wt Readings from Last 3 Encounters:  12/06/22 161 lb 3.2 oz (73.1 kg)  11/14/22 162 lb (73.5 kg)  10/29/22 161 lb 8 oz (73.3 kg)     GENERAL:alert, no distress and comfortable SKIN: skin color normal, no rashes or significant lesions EYES: normal, Conjunctiva are pink and non-injected, sclera clear  NEURO: alert & oriented x 3 with fluent speech  LABORATORY DATA:  I have reviewed the data as listed    Latest Ref Rng & Units 12/06/2022   12:26 PM 11/14/2022   11:54 AM 10/22/2022    7:40 AM  CBC  WBC 4.0 - 10.5 K/uL 4.5  5.3  4.6   Hemoglobin 12.0 - 15.0 g/dL 13.7  13.5  13.9   Hematocrit 36.0 - 46.0 % 39.7  39.5  40.8   Platelets 150 - 400 K/uL 178  221  182         Latest Ref Rng & Units 12/06/2022   12:26 PM 11/14/2022   11:54 AM 10/22/2022    7:40 AM  CMP  Glucose 70 - 99 mg/dL 93   107  114   BUN 8 - 23 mg/dL _0 Creatinine 0.44 - 1.00 mg/dL 0.62  0.67  0.63   Sodium 135 - 145 mmol/L 135  133  136   Potassium 3.5 - 5.1 mmol/L 3.6  4.1  4.0   Chloride 98 - 111 mmol/L 100  99  101   CO2 22 - 32 mmol/L _1 Calcium 8.9 - 10.3 mg/dL 9.4  9.7  9.6   Total Protein 6.5 - 8.1 g/dL 7.0  7.1  7.5   Total Bilirubin 0.3 - 1.2 mg/dL 0.5  0.5  0.4   Alkaline Phos 38 - 126 U/L 126  127  109   AST 15 - 41 U/L _2 ALT 0 - 44 U/L _3 RADIOGRAPHIC STUDIES: I have personally reviewed the radiological images as listed and agreed with the findings in the report. No results found.    Orders Placed This Encounter  Procedures   MM Digital Screening Unilat L    Standing Status:   Future    Standing Expiration Date:   12/06/2023    Order Specific Question:   Reason for Exam (SYMPTOM  OR DIAGNOSIS REQUIRED)    Answer:   screening    Order Specific Question:   Preferred imaging location?    Answer:   [facility]   CBC with Differential ([place] Only)    Standing Status:   Future    Standing Expiration Date:   12/28/2023   CMP ([place] only)    Standing Status:   Future    Standing Expiration Date:   12/28/2023   CBC with Differential ([facility] Only)    Standing Status:   Future    Standing Expiration Date:   01/18/2024   CMP ([place] only)    Standing Status:   Future    Standing Expiration Date:   01/18/2024   CBC with Differential ([facility] Only)    Standing Status:   Future  Standing Expiration Date:   02/08/2024   CMP ([place] only)    Standing Status:   Future    Standing Expiration Date:   02/08/2024   All questions were answered. The patient knows to call the clinic with any problems, questions or concerns. No barriers to learning was detected. The total time spent in the appointment was 30 minutes.     Truitt Merle, MD 12/06/2022   Felicity Coyer, CMA, am acting as scribe for Truitt Merle, MD.    I have reviewed the above documentation for accuracy and completeness, and I agree with the above.

## 2022-12-06 NOTE — Patient Instructions (Signed)
[facility]  Discharge Instructions: Thank you for choosing [facility] to provide your oncology and hematology care.   If you have a lab appointment with the [facility], please go directly to the [facility] and check in at the registration area.   Wear comfortable clothing and clothing appropriate for easy access to any Portacath or PICC line.   We strive to give you quality time with your provider. You may need to reschedule your appointment if you arrive late (15 or more minutes).  Arriving late affects you and other patients whose appointments are after yours.  Also, if you miss three or more appointments without notifying the office, you may be dismissed from the clinic at the provider's discretion.      For prescription refill requests, have your pharmacy contact our office and allow 72 hours for refills to be completed.    Today you received the following chemotherapy and/or immunotherapy agents Kadcyla      To help prevent nausea and vomiting after your treatment, we encourage you to take your nausea medication as directed.  BELOW ARE SYMPTOMS THAT SHOULD BE REPORTED IMMEDIATELY: *FEVER GREATER THAN 100.4 F (38 C) OR HIGHER *CHILLS OR SWEATING *NAUSEA AND VOMITING THAT IS NOT CONTROLLED WITH YOUR NAUSEA MEDICATION *UNUSUAL SHORTNESS OF BREATH *UNUSUAL BRUISING OR BLEEDING *URINARY PROBLEMS (pain or burning when urinating, or frequent urination) *BOWEL PROBLEMS (unusual diarrhea, constipation, pain near the anus) TENDERNESS IN MOUTH AND THROAT WITH OR WITHOUT PRESENCE OF ULCERS (sore throat, sores in mouth, or a toothache) UNUSUAL RASH, SWELLING OR PAIN  UNUSUAL VAGINAL DISCHARGE OR ITCHING   Items with * indicate a potential emergency and should be followed up as soon as possible or go to the Emergency Department if any problems should occur.  Please show the CHEMOTHERAPY ALERT CARD or IMMUNOTHERAPY ALERT CARD at check-in to the  Emergency Department and triage nurse.  Should you have questions after your visit or need to cancel or reschedule your appointment, please contact [facility]  Dept: [phone]  and follow the prompts.  Office hours are 8:00 a.m. to 4:30 p.m. Monday - Friday. Please note that voicemails left after 4:00 p.m. may not be returned until the following business day.  We are closed weekends and major holidays. You have access to a nurse at all times for urgent questions. Please call the main number to the clinic Dept: [phone] and follow the prompts.   For any non-urgent questions, you may also contact your provider using MyChart. We now offer e-Visits for anyone 35 and older to request care online for non-urgent symptoms. For details visit [email or web address].   Also download the MyChart app! Go to the app store, search "MyChart", open the app, select [facility], and log in with your MyChart username and password.  Masks are optional in the cancer centers. If you would like for your care team to wear a mask while they are taking care of you, please let them know. You may have one support person who is at least 68 years old accompany you for your appointments.  Ado-Trastuzumab Emtansine Injection What is this medication? ADO-TRASTUZUMAB EMTANSINE (ADD oh traz TOO zuh mab em TAN zine) treats breast cancer. It works by blocking a protein that causes cancer cells to grow and multiply. This helps to slow or stop the spread of cancer cells. This medicine may be used for other purposes; ask your health care provider or pharmacist  if you have questions. COMMON BRAND NAME(S): Kadcyla What should I tell my care team before I take this medication? They need to know if you have any of these conditions: Heart failure Liver disease Low platelet levels Lung disease Tingling of the fingers or toes or other nerve disorder An unusual or allergic reaction to ado-trastuzumab  emtansine, other medications, foods, dyes, or preservatives Pregnant or trying to get pregnant Breast-feeding How should I use this medication? This medication is infused into a vein. It is given by your care team in a hospital or clinic setting. Talk to your care team about the use of this medication in children. Special care may be needed. Overdosage: If you think you have taken too much of this medicine contact a poison control center or emergency room at once. NOTE: This medicine is only for you. Do not share this medicine with others. What if I miss a dose? Keep appointments for follow-up doses. It is important not to miss your dose. Call your care team if you are unable to keep an appointment. What may interact with this medication? Atazanavir Boceprevir Clarithromycin Dalfopristin; quinupristin Delavirdine Indinavir Isoniazid, INH Itraconazole Ketoconazole Nefazodone Nelfinavir Ritonavir Telaprevir Telithromycin Tipranavir Voriconazole This list may not describe all possible interactions. Give your health care provider a list of all the medicines, herbs, non-prescription drugs, or dietary supplements you use. Also tell them if you smoke, drink alcohol, or use illegal drugs. Some items may interact with your medicine. What should I watch for while using this medication? This medication may make you feel generally unwell. This is not uncommon, as chemotherapy can affect healthy cells as well as cancer cells. Report any side effects. Continue your course of treatment even though you feel ill unless your care team tells you to stop. You may need blood work while taking this medication. This medication may increase your risk to bruise or bleed. Call your care team if you notice any unusual bleeding. Be careful brushing or flossing your teeth or using a toothpick because you may get an infection or bleed more easily. If you have any dental work done, tell your dentist you are  receiving this medication. Talk to your care team if you may be pregnant. Serious birth defects can occur if you take this medication during pregnancy and for 7 months after the last dose. You will need a negative pregnancy test before starting this medication. Contraception is recommended while taking this medication and for 7 months after the last dose. Your care team can help you find the option that works for you. If your partner can get pregnant, use a condom during sex while taking this medication and for 4 months after the last dose. Do not breastfeed while taking this medication and for 7 months after the last dose. This medication may cause infertility. Talk to your care team if you are concerned with your fertility. What side effects may I notice from receiving this medication? Side effects that you should report to your care team as soon as possible: Allergic reactions--skin rash, itching, hives, swelling of the face, lips, tongue, or throat Bleeding--bloody or black, tar-like stools, vomiting blood or brown material that looks like coffee grounds, red or dark brown urine, small red or purple spots on skin, unusual bruising or bleeding Dry cough, shortness of breath or trouble breathing Heart failure--shortness of breath, swelling of the ankles, feet, or hands, sudden weight gain, unusual weakness or fatigue Infusion reactions--chest pain, shortness of breath or trouble breathing,  feeling faint or lightheaded Liver injury--right upper belly pain, loss of appetite, nausea, light-colored stool, dark yellow or brown urine, yellowing skin or eyes, unusual weakness or fatigue Pain, tingling, or numbness in the hands or feet Painful swelling, warmth, or redness of the skin, blisters or sores at the infusion site Side effects that usually do not require medical attention (report to your care team if they continue or are bothersome): Constipation Fatigue Headache Muscle pain Nausea This list  may not describe all possible side effects. Call your doctor for medical advice about side effects. You may report side effects to FDA at 1-800-FDA-1088. Where should I keep my medication? This medication is given in a hospital or clinic. It will not be stored at home. NOTE: This sheet is a summary. It may not cover all possible information. If you have questions about this medicine, talk to your doctor, pharmacist, or health care provider.  2023 Elsevier/Gold Standard (2022-04-06 00:00:00)

## 2022-12-07 ENCOUNTER — Encounter: Payer: Self-pay | Admitting: *Deleted

## 2022-12-13 ENCOUNTER — Telehealth: Payer: Self-pay

## 2022-12-13 DIAGNOSIS — Z853 Personal history of malignant neoplasm of breast: Secondary | ICD-10-CM | POA: Diagnosis not present

## 2022-12-13 DIAGNOSIS — Z9011 Acquired absence of right breast and nipple: Secondary | ICD-10-CM | POA: Diagnosis not present

## 2022-12-13 NOTE — Telephone Encounter (Signed)
Pt called to schedule an appt with MD so she can get clearance from vascular standpoint to proceed with breast reconstruction surgery. She has AAA and was told by plastic surgery (Dr. Leland Johns) to f/u with our office for this. I have sent a staff message to MD. I have also left plastic surgery office a VM to call us back to clarify.

## 2022-12-14 ENCOUNTER — Other Ambulatory Visit: Payer: Self-pay

## 2022-12-14 ENCOUNTER — Ambulatory Visit
Admission: RE | Admit: 2022-12-14 | Discharge: 2022-12-14 | Disposition: A | Discharge status: Home / Self Care | Payer: PPO | Source: Ambulatory Visit | Attending: Hematology | Admitting: Hematology

## 2022-12-14 ENCOUNTER — Telehealth: Payer: Self-pay

## 2022-12-14 DIAGNOSIS — Z1231 Encounter for screening mammogram for malignant neoplasm of breast: Secondary | ICD-10-CM

## 2022-12-14 DIAGNOSIS — I7133 Infrarenal abdominal aortic aneurysm, ruptured: Secondary | ICD-10-CM

## 2022-12-14 DIAGNOSIS — I7143 Infrarenal abdominal aortic aneurysm, without rupture: Secondary | ICD-10-CM

## 2022-12-14 NOTE — Telephone Encounter (Signed)
Laurel with [facility] called back asking if pt needed to be seen and re-evaluated before proceeding with breast reconstructive surgery d/t size of AAA.  Returned call, informed her that her 6 month CT f/u was due in March. Izora Gala, RN had already sent to Dr. Donnetta Hutching. Since he wants her transitioned to Dr. Trula Slade, she was informed that he would be in the office on Friday and would be asked. The CT would go ahead and be ordered since it was due soon anyway. Confirmed understanding.  Spoke with Dr. Trula Slade who stated the pt needed to be re-evaluated since her AAA was over 5 cm. Pt called and appt scheduled. [facility] and informed her to have Dr. Iran Planas hold off on the breast reconstruction until after pt's visit with Dr. Trula Slade. Confirmed understanding.

## 2022-12-19 ENCOUNTER — Ambulatory Visit (HOSPITAL_COMMUNITY)
Admission: RE | Admit: 2022-12-19 | Discharge: 2022-12-19 | Disposition: A | Discharge status: Home / Self Care | Payer: PPO | Source: Ambulatory Visit | Attending: Hematology | Admitting: Hematology

## 2022-12-19 DIAGNOSIS — R9431 Abnormal electrocardiogram [ECG] [EKG]: Secondary | ICD-10-CM

## 2022-12-19 DIAGNOSIS — J449 Chronic obstructive pulmonary disease, unspecified: Secondary | ICD-10-CM | POA: Insufficient documentation

## 2022-12-19 DIAGNOSIS — I10 Essential (primary) hypertension: Secondary | ICD-10-CM | POA: Insufficient documentation

## 2022-12-19 DIAGNOSIS — E785 Hyperlipidemia, unspecified: Secondary | ICD-10-CM | POA: Diagnosis not present

## 2022-12-19 DIAGNOSIS — Z170 Estrogen receptor positive status [ER+]: Secondary | ICD-10-CM

## 2022-12-19 DIAGNOSIS — C50919 Malignant neoplasm of unspecified site of unspecified female breast: Secondary | ICD-10-CM | POA: Insufficient documentation

## 2022-12-19 DIAGNOSIS — Z87891 Personal history of nicotine dependence: Secondary | ICD-10-CM | POA: Diagnosis not present

## 2022-12-19 DIAGNOSIS — C50811 Malignant neoplasm of overlapping sites of right female breast: Secondary | ICD-10-CM | POA: Diagnosis not present

## 2022-12-19 LAB — ECHOCARDIOGRAM COMPLETE
Area-P 1/2: 2.93 cm2
Calc EF: 56.7 %
MV VTI: 2.73 cm2
S' Lateral: 4.1 cm
Single Plane A2C EF: 53.7 %
Single Plane A4C EF: 61.5 %

## 2022-12-19 NOTE — Progress Notes (Signed)
  Echocardiogram 2D Echocardiogram has been performed.  Ashley Robertson 12/19/2022, 10:09 AM

## 2022-12-21 ENCOUNTER — Ambulatory Visit (HOSPITAL_COMMUNITY)
Admission: RE | Admit: 2022-12-21 | Discharge: 2022-12-21 | Disposition: A | Discharge status: Home / Self Care | Payer: PPO | Source: Ambulatory Visit | Attending: Surgery | Admitting: Surgery

## 2022-12-21 DIAGNOSIS — I7133 Infrarenal abdominal aortic aneurysm, ruptured: Secondary | ICD-10-CM | POA: Diagnosis not present

## 2022-12-21 DIAGNOSIS — I7143 Infrarenal abdominal aortic aneurysm, without rupture: Secondary | ICD-10-CM | POA: Diagnosis not present

## 2022-12-21 DIAGNOSIS — Z01818 Encounter for other preprocedural examination: Secondary | ICD-10-CM | POA: Diagnosis not present

## 2022-12-21 MED ORDER — IOHEXOL 350 MG/ML SOLN
100.0000 mL | Freq: Once | INTRAVENOUS | Status: AC | PRN
  Administered 2022-12-21: 100 mL via INTRAVENOUS

## 2022-12-21 MED ORDER — SODIUM CHLORIDE (PF) 0.9 % IJ SOLN
INTRAMUSCULAR | Status: AC
  Filled 2022-12-21: qty 50

## 2022-12-24 ENCOUNTER — Ambulatory Visit (INDEPENDENT_AMBULATORY_CARE_PROVIDER_SITE_OTHER): Payer: PPO | Admitting: Surgery

## 2022-12-24 ENCOUNTER — Encounter: Payer: Self-pay | Admitting: Surgery

## 2022-12-24 VITALS — BP 158/74 | HR 56 | Temp 98.3°F | Ht 69.0 in | Wt 156.0 lb

## 2022-12-24 DIAGNOSIS — I7142 Juxtarenal abdominal aortic aneurysm, without rupture: Secondary | ICD-10-CM

## 2022-12-24 NOTE — H&P (View-Only) (Signed)
[facility]  Patient name: Ashley Robertson MRN: [id number] DOB: 1956-09-01 Sex: female   REASON FOR VISIT:    Follow up  HISOTRY OF PRESENT ILLNESS:    Ashley Robertson is a 67 y.o. female who returns today for follow-up of her abdominal aortic aneurysm.  This was initially diagnosed during a workup for breast cancer.  CT scan showed a 5.4 cm infrarenal abdominal aortic aneurysm.  She did not have any prior knowledge of her aneurysm.  There is no family history.  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  She is scheduled to have reconstructive surgery but needed vascular clearance.  She had a repeat CT scan and is back today to discuss this.  Patient has had carotid ultrasounds in the past which show 1 to 39% stenosis.  She has a history of coronary artery calcification with a calcium score of 79.  She is a former smoker.  She is medically managed for hypertension.  She takes a statin for hypercholesterolemia   PAST MEDICAL HISTORY:   Past Medical History:  Diagnosis Date   Allergy    Lisinopril and lipitor   Anxiety and depression    Aortic atherosclerosis ([facility]) 03/2020   CT Chest: 2 V (LAD & LCx) Coronary Atherosclerosis, Aortic Atherosclerosis (no aneurysm).  Mild centrilobular emphysema with mild diffuse bronchial thickening; several scattered small solitary pulmonary nodules (largest 5.6 mm in anterior left upper lobe)   Breast cancer ([facility]) 12/2021   Right breast ILC   Carotid artery plaque, bilateral 10/2015   Mild to moderate plaque L>R without significant stenosis   COPD (chronic obstructive pulmonary disease) (HCC)    Coronary Artery Calcification - Score 79    Coronary Calcium Score 79.  LAD and circumflex calcification noted.  Normal ascending aorta with mild calcification.   Current every day smoker    pt quit smoking April 2023   Emphysema lung [facility])    Noted on chest CT   Family  history of breast cancer 02/21/2022   Family history of prostate cancer 02/21/2022   Hyperlipidemia    Hypertension    Controlled with amlodipine   Prediabetes    Skin cancer 2021   Remove 2022     FAMILY HISTORY:   Family History  Problem Relation Age of Onset   Transient ischemic attack Mother 73   Parkinson's disease Father    Hyperlipidemia Father    Hypertension Father    Cancer - Other Father        sarcoma--left shoulder   Prostate cancer Father        dx after 23   Healthy Sister    Hypertension Sister    Lung cancer Brother        dx 25s   Healthy Brother    Healthy Brother    Skin cancer Maternal Aunt    Breast cancer Paternal Aunt        dx after 51   Lung cancer Maternal Grandmother        dx 73s   Liver cancer Maternal Grandfather        dx 78s   Breast cancer Niece        dx 63s    SOCIAL HISTORY:   Social History   Tobacco Use   Smoking status: Former    Packs/day: 1.00    Years: 40.00    Total pack years: 40.00    Types: Cigarettes  Quit date: 04/01/2022    Years since quitting: 0.7   Smokeless tobacco: Never  Substance Use Topics   Alcohol use: Yes    Alcohol/week: 6.0 standard drinks of alcohol    Types: 6 Glasses of wine per week    Comment: social     ALLERGIES:   Allergies  Allergen Reactions   Lisinopril Other (See Comments) and Cough    High potassium   Lipitor [Atorvastatin] Other (See Comments)    Myalgias     CURRENT MEDICATIONS:   Current Outpatient Medications  Medication Sig Dispense Refill   albuterol (PROVENTIL HFA) 108 (90 Base) MCG/ACT inhaler Inhale 1 to 2 puffs into the lungs every 6 hours as needed 8.5 g 2   amLODipine (NORVASC) 10 MG tablet Take 1 tablet (10 mg total) by mouth at bedtime. 90 tablet 3   anastrozole (ARIMIDEX) 1 MG tablet Take 1 tablet (1 mg total) by mouth daily. 30 tablet 2   aspirin 81 MG tablet Take 81 mg by mouth at bedtime.     Calcium Carb-Cholecalciferol (CALCIUM 500 + D3 PO)  Take 1 tablet by mouth daily.     Cholecalciferol (VITAMIN D) 50 MCG (2000 UT) CAPS Take 1 capsule (2,000 Units total) by mouth daily. 30 capsule 2   FLUoxetine (PROZAC) 20 MG capsule Take 1 capsule (20 mg total) by mouth daily. 90 capsule 1   Fluticasone-Umeclidin-Vilant (TRELEGY ELLIPTA) 200-62.5-25 MCG/ACT AEPB Inhale 1 puff into the lungs daily. 14 each 0   Fluticasone-Umeclidin-Vilant (TRELEGY ELLIPTA) 200-62.5-25 MCG/ACT AEPB Inhale 1 puff into the lungs daily. 60 each 2   OVER THE COUNTER MEDICATION Take 1 capsule by mouth daily. Serrapeptase 120,000 units daily  Reported on 12/27/2015     rosuvastatin (CRESTOR) 5 MG tablet Take 1 tablet by mouth daily at bedtime ,except on Monday, Wednesday and Fridays take 2 tablets at bedtime as directed 180 tablet 3   Specialty Vitamins Products (MAGNESIUM, AMINO ACID CHELATE,) 133 MG tablet Take 1 tablet by mouth at bedtime. PURE magnesium supplement - 120 mg of mag per capsule     No current facility-administered medications for this visit.    REVIEW OF SYSTEMS:   [X]$  denotes positive finding, [ ]$  denotes negative finding Cardiac  Comments:  Chest pain or chest pressure:    Shortness of breath upon exertion:    Short of breath when lying flat:    Irregular heart rhythm:        Vascular    Pain in calf, thigh, or hip brought on by ambulation:    Pain in feet at night that wakes you up from your sleep:     Blood clot in your veins:    Leg swelling:         Pulmonary    Oxygen at home:    Productive cough:     Wheezing:         Neurologic    Sudden weakness in arms or legs:     Sudden numbness in arms or legs:     Sudden onset of difficulty speaking or slurred speech:    Temporary loss of vision in one eye:     Problems with dizziness:         Gastrointestinal    Blood in stool:     Vomited blood:         Genitourinary    Burning when urinating:     Blood in urine:        Psychiatric  Major depression:         Hematologic     Bleeding problems:    Problems with blood clotting too easily:        Skin    Rashes or ulcers:        Constitutional    Fever or chills:      PHYSICAL EXAM:   Vitals:   12/24/22 0847  BP: (!) 158/74  Pulse: (!) 56  Temp: 98.3 F (36.8 C)  SpO2: 94%  Weight: 156 lb (70.8 kg)  Height: 5' 9"$  (1.753 m)    GENERAL: The patient is a well-nourished female, in no acute distress. The vital signs are documented above. CARDIAC: There is a regular rate and rhythm.  VASCULAR: Palpable posterior tibial pulses PULMONARY: Non-labored respirations ABDOMEN: Soft and non-tender with nontender pulsatile mass MUSCULOSKELETAL: There are no major deformities or cyanosis. NEUROLOGIC: No focal weakness or paresthesias are detected. SKIN: There are no ulcers or rashes noted. PSYCHIATRIC: The patient has a normal affect.  STUDIES:   I have reviewed the following CT scan:  VASCULAR   1. Enlarging fusiform infrarenal abdominal aortic aneurysm now measuring up to 5.7 cm in maximal diameter compared to 5.4 cm in April of 2023. Patient is currently under the care of a vascular surgeon and this examination is for preoperative planning. Therefore, no specific follow-up recommendations are given. 2. 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   NON-VASCULAR   1. No acute abnormality in the abdomen or pelvis. 2. Ancillary findings as above. MEDICAL ISSUES:   Abdominal aortic aneurysm: The patient has a juxtarenal aneurysm that is increased in size, now measuring 5.7 cm.  Her neck is too short for standard stent graft repair and I think her SMA and right renal were too close to each other to consider fenestrated repair.  I think her best option is a open repair.  I discussed the details of the operation.  We talked about the risks including cardiopulmonary issues, wound complications, hernia, intestinal  ischemia, lower extremity ischemia, and renal insufficiency.  All of her questions were answered.  Her only prior operation is a lower abdominal transverse incision.  She sees Dr. Ellyn Hack with cardiology.  I will make sure he is okay with her proceeding.  She had a echocardiogram recently that showed an ejection fraction of 50%.  I have her scheduled for repair on February 9    Leia Alf, MD, FACS [facility] [phone] Pager [phone]

## 2022-12-24 NOTE — Progress Notes (Signed)
 [facility]  Patient name: Ashley Robertson MRN: [id number] DOB: 03/27/1956 Sex: female   REASON FOR VISIT:    Follow up  HISOTRY OF PRESENT ILLNESS:    Ashley Robertson is a 66 y.o. female who returns today for follow-up of her abdominal aortic aneurysm.  This was initially diagnosed during a workup for breast cancer.  CT scan showed a 5.4 cm infrarenal abdominal aortic aneurysm.  She did not have any prior knowledge of her aneurysm.  There is no family history.  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  She is scheduled to have reconstructive surgery but needed vascular clearance.  She had a repeat CT scan and is back today to discuss this.  Patient has had carotid ultrasounds in the past which show 1 to 39% stenosis.  She has a history of coronary artery calcification with a calcium score of 79.  She is a former smoker.  She is medically managed for hypertension.  She takes a statin for hypercholesterolemia   PAST MEDICAL HISTORY:   Past Medical History:  Diagnosis Date   Allergy    Lisinopril and lipitor   Anxiety and depression    Aortic atherosclerosis (HCC) 03/2020   CT Chest: 2 V (LAD & LCx) Coronary Atherosclerosis, Aortic Atherosclerosis (no aneurysm).  Mild centrilobular emphysema with mild diffuse bronchial thickening; several scattered small solitary pulmonary nodules (largest 5.6 mm in anterior left upper lobe)   Breast cancer (HCC) 12/2021   Right breast ILC   Carotid artery plaque, bilateral 10/2015   Mild to moderate plaque L>R without significant stenosis   COPD (chronic obstructive pulmonary disease) (HCC)    Coronary Artery Calcification - Score 79    Coronary Calcium Score 79.  LAD and circumflex calcification noted.  Normal ascending aorta with mild calcification.   Current every day smoker    pt quit smoking April 2023   Emphysema lung (HCC)    Noted on chest CT   Family  history of breast cancer 02/21/2022   Family history of prostate cancer 02/21/2022   Hyperlipidemia    Hypertension    Controlled with amlodipine   Prediabetes    Skin cancer 2021   Remove 2022     FAMILY HISTORY:   Family History  Problem Relation Age of Onset   Transient ischemic attack Mother 82   Parkinson's disease Father    Hyperlipidemia Father    Hypertension Father    Cancer - Other Father        sarcoma--left shoulder   Prostate cancer Father        dx after 50   Healthy Sister    Hypertension Sister    Lung cancer Brother        dx 70s   Healthy Brother    Healthy Brother    Skin cancer Maternal Aunt    Breast cancer Paternal Aunt        dx after 50   Lung cancer Maternal Grandmother        dx 80s   Liver cancer Maternal Grandfather        dx 80s   Breast cancer Niece        dx 30s    SOCIAL HISTORY:   Social History   Tobacco Use   Smoking status: Former    Packs/day: 1.00    Years: 40.00    Total pack years: 40.00    Types: Cigarettes      Quit date: 04/01/2022    Years since quitting: 0.7   Smokeless tobacco: Never  Substance Use Topics   Alcohol use: Yes    Alcohol/week: 6.0 standard drinks of alcohol    Types: 6 Glasses of wine per week    Comment: social     ALLERGIES:   Allergies  Allergen Reactions   Lisinopril Other (See Comments) and Cough    High potassium   Lipitor [Atorvastatin] Other (See Comments)    Myalgias     CURRENT MEDICATIONS:   Current Outpatient Medications  Medication Sig Dispense Refill   albuterol (PROVENTIL HFA) 108 (90 Base) MCG/ACT inhaler Inhale 1 to 2 puffs into the lungs every 6 hours as needed 8.5 g 2   amLODipine (NORVASC) 10 MG tablet Take 1 tablet (10 mg total) by mouth at bedtime. 90 tablet 3   anastrozole (ARIMIDEX) 1 MG tablet Take 1 tablet (1 mg total) by mouth daily. 30 tablet 2   aspirin 81 MG tablet Take 81 mg by mouth at bedtime.     Calcium Carb-Cholecalciferol (CALCIUM 500 + D3 PO)  Take 1 tablet by mouth daily.     Cholecalciferol (VITAMIN D) 50 MCG (2000 UT) CAPS Take 1 capsule (2,000 Units total) by mouth daily. 30 capsule 2   FLUoxetine (PROZAC) 20 MG capsule Take 1 capsule (20 mg total) by mouth daily. 90 capsule 1   Fluticasone-Umeclidin-Vilant (TRELEGY ELLIPTA) 200-62.5-25 MCG/ACT AEPB Inhale 1 puff into the lungs daily. 14 each 0   Fluticasone-Umeclidin-Vilant (TRELEGY ELLIPTA) 200-62.5-25 MCG/ACT AEPB Inhale 1 puff into the lungs daily. 60 each 2   OVER THE COUNTER MEDICATION Take 1 capsule by mouth daily. Serrapeptase 120,000 units daily  Reported on 12/27/2015     rosuvastatin (CRESTOR) 5 MG tablet Take 1 tablet by mouth daily at bedtime ,except on Monday, Wednesday and Fridays take 2 tablets at bedtime as directed 180 tablet 3   Specialty Vitamins Products (MAGNESIUM, AMINO ACID CHELATE,) 133 MG tablet Take 1 tablet by mouth at bedtime. PURE magnesium supplement - 120 mg of mag per capsule     No current facility-administered medications for this visit.    REVIEW OF SYSTEMS:   [X] denotes positive finding, [ ] denotes negative finding Cardiac  Comments:  Chest pain or chest pressure:    Shortness of breath upon exertion:    Short of breath when lying flat:    Irregular heart rhythm:        Vascular    Pain in calf, thigh, or hip brought on by ambulation:    Pain in feet at night that wakes you up from your sleep:     Blood clot in your veins:    Leg swelling:         Pulmonary    Oxygen at home:    Productive cough:     Wheezing:         Neurologic    Sudden weakness in arms or legs:     Sudden numbness in arms or legs:     Sudden onset of difficulty speaking or slurred speech:    Temporary loss of vision in one eye:     Problems with dizziness:         Gastrointestinal    Blood in stool:     Vomited blood:         Genitourinary    Burning when urinating:     Blood in urine:        Psychiatric      Major depression:         Hematologic     Bleeding problems:    Problems with blood clotting too easily:        Skin    Rashes or ulcers:        Constitutional    Fever or chills:      PHYSICAL EXAM:   Vitals:   12/24/22 0847  BP: (!) 158/74  Pulse: (!) 56  Temp: 98.3 F (36.8 C)  SpO2: 94%  Weight: 156 lb (70.8 kg)  Height: 5' 9" (1.753 m)    GENERAL: The patient is a well-nourished female, in no acute distress. The vital signs are documented above. CARDIAC: There is a regular rate and rhythm.  VASCULAR: Palpable posterior tibial pulses PULMONARY: Non-labored respirations ABDOMEN: Soft and non-tender with nontender pulsatile mass MUSCULOSKELETAL: There are no major deformities or cyanosis. NEUROLOGIC: No focal weakness or paresthesias are detected. SKIN: There are no ulcers or rashes noted. PSYCHIATRIC: The patient has a normal affect.  STUDIES:   I have reviewed the following CT scan:  VASCULAR   1. Enlarging fusiform infrarenal abdominal aortic aneurysm now measuring up to 5.7 cm in maximal diameter compared to 5.4 cm in April of 2023. Patient is currently under the care of a vascular surgeon and this examination is for preoperative planning. Therefore, no specific follow-up recommendations are given. 2. 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   NON-VASCULAR   1. No acute abnormality in the abdomen or pelvis. 2. Ancillary findings as above. MEDICAL ISSUES:   Abdominal aortic aneurysm: The patient has a juxtarenal aneurysm that is increased in size, now measuring 5.7 cm.  Her neck is too short for standard stent graft repair and I think her SMA and right renal were too close to each other to consider fenestrated repair.  I think her best option is a open repair.  I discussed the details of the operation.  We talked about the risks including cardiopulmonary issues, wound complications, hernia, intestinal  ischemia, lower extremity ischemia, and renal insufficiency.  All of her questions were answered.  Her only prior operation is a lower abdominal transverse incision.  She sees Dr. Harding with cardiology.  I will make sure he is okay with her proceeding.  She had a echocardiogram recently that showed an ejection fraction of 50%.  I have her scheduled for repair on February 9    Wells Keison Glendinning, IV, MD, FACS [facility] Tel [phone] Pager [phone]  

## 2022-12-25 ENCOUNTER — Other Ambulatory Visit: Payer: Self-pay

## 2022-12-25 DIAGNOSIS — I7142 Juxtarenal abdominal aortic aneurysm, without rupture: Secondary | ICD-10-CM

## 2022-12-25 NOTE — Progress Notes (Deleted)
Ashley Robertson   [phone] [phone]   Clinic Follow up Note   Patient Care Team: Cari Caraway, MD as PCP - General (Family Medicine) Leonie Man, MD as PCP - Cardiology (Cardiology) Mauro Kaufmann, RN as Oncology Nurse Navigator Rockwell Germany, RN as Oncology Nurse Navigator Rolm Bookbinder, MD as Consulting Physician (General Surgery) Truitt Merle, MD as Consulting Physician (Hematology) Kyung Rudd, MD as Consulting Physician (Radiation Oncology)  Date of Service:  12/25/2022  CHIEF COMPLAINT: f/u of right breast cancer    CURRENT THERAPY:  Kadcyla, q21d, starting 08/24/22  -post-mastectomy radiation, 09/11/22 - 10/24/22    ASSESSMENT: *** Ashley Robertson is a 67 y.o. female with   No problem-specific Assessment & Plan notes found for this encounter.  ***   PLAN: {Everything Dr. Burr Medico talks to pt about, including reviewing scans and labs. } -{proceed with ***} -{lab with/without flush and f/u when?}   SUMMARY OF ONCOLOGIC HISTORY: Oncology History Overview Note   Cancer Staging  Malignant neoplasm of overlapping sites of right breast in female, estrogen receptor positive ([place]) Staging form: Breast, AJCC 8th Edition - Clinical stage from 02/09/2022: Stage IIA (cT2, cN1, cM0, G2, ER+, PR-, HER2+) - Signed by Truitt Merle, MD on 02/20/2022    Malignant neoplasm of overlapping sites of right breast in female, estrogen receptor positive ([place])  02/08/2022 Mammogram   CLINICAL DATA:  Acute onset right breast lump.  EXAM: DIGITAL DIAGNOSTIC UNILATERAL RIGHT MAMMOGRAM WITH TOMOSYNTHESIS AND CAD; ULTRASOUND RIGHT BREAST LIMITED  IMPRESSION: 1. Highly suspicious findings mammographically and sonographically. I suspect there is significant malignancy throughout most of the right breast. Discrete masses are seen at 11 o'clock and 6 o'clock.  A single borderline lymph node is identified. This lymph node appears prominent compared to the  remainder of the lymph nodes. The skin thickening suggests the possibility of inflammatory breast cancer.   02/09/2022 Cancer Staging   Staging form: Breast, AJCC 8th Edition - Clinical stage from 02/09/2022: Stage IIIA (cT3, cN1, cM0, G2, ER+, PR-, HER2+) - Signed by Truitt Merle, MD on 03/05/2022 Stage prefix: Initial diagnosis Histologic grading system: 3 grade system   02/09/2022 Initial Biopsy   Diagnosis 1. Breast, right, needle core biopsy, right breast 11 o'clock mass ribbon clip - INVASIVE MAMMARY CARCINOMA - SEE COMMENT 2. Breast, right, needle core biopsy, right breast 6'oclock mass, coil clip - INVASIVE MAMMARY CARCINOMA - SEE COMMENT 3. Lymph node, needle/core biopsy, right axillary lymph node, tribell clip - METASTATIC CARCINOMA INVOLVING NODAL TISSUE Microscopic Comment 1. and 2. The biopsy material shows an infiltrative proliferation of cells arranged linearly and in small clusters. Based on the biopsy, the carcinoma appears Nottingham grade 2 of 3 and measures 1.5 cm in greatest linear extent.  Addendum parts 1 and 2: Immunohistochemistry for E-cadherin is negative consistent with lobular carcinoma.  2. PROGNOSTIC INDICATORS Results: The tumor cells are EQUIVOCAL for Her2 (2+). Her2 by FISH will be performed and the results reported separately. Estrogen Receptor: 100%, POSITIVE, STRONG STAINING INTENSITY Progesterone Receptor: <1%, NEGATIVE Proliferation Marker Ki67: 10%  2. FLUORESCENCE IN-SITU HYBRIDIZATION Results: GROUP 1: HER2 **POSITIVE**   3. PROGNOSTIC INDICATORS Results: By immunohistochemistry, the tumor cells are POSITIVE for Her2 (3+). Estrogen Receptor: 100%, POSITIVE, STRONG STAINING INTENSITY Progesterone Receptor: <1%, NEGATIVE   02/16/2022 Initial Diagnosis   Malignant neoplasm of overlapping sites of right breast in female, estrogen receptor positive ([place])   03/01/2022 Imaging   EXAM: BILATERAL BREAST MRI WITH AND WITHOUT  CONTRAST  IMPRESSION: 1. 8.5 x 8.3 x 5.9 cm area of confluent mass-like enhancement in the right breast involving all 4 quadrants and containing 2 biopsy marker clip artifacts, compatible with 4 quadrant biopsy-proven malignancy. 2. Biopsy-proven metastatic lymph node in the right axilla. 3. Possible metastatic intramammary lymph node in the posterior outer right breast just above the level of the nipple. 4. No evidence of malignancy on the left.   03/01/2022 Genetic Testing   Negative hereditary cancer genetic testing: no pathogenic variants detected in [facility] CustomNext-Cancer +RNAinsight Panel.  Variant of uncertain significance reported in BRIP1 at p.F934V (c.2800T>G). Report date is March 01, 2022.    The CustomNext-Cancer+RNAinsight panel offered by [facility] includes sequencing and rearrangement analysis for the following 47 genes:  APC, ATM, AXIN2, BARD1, BMPR1A, BRCA1, BRCA2, BRIP1, CDH1, CDK4, CDKN2A, CHEK2, DICER1, EPCAM, GREM1, HOXB13, MEN1, MLH1, MSH2, MSH3, MSH6, MUTYH, NBN, NF1, NF2, NTHL1, PALB2, PMS2, POLD1, POLE, PTEN, RAD51C, RAD51D, RECQL, RET, SDHA, SDHAF2, SDHB, SDHC, SDHD, SMAD4, SMARCA4, STK11, TP53, TSC1, TSC2, and VHL.  RNA data is routinely analyzed for use in variant interpretation for all genes.   03/05/2022 Imaging   EXAM: CT CHEST, ABDOMEN, AND PELVIS WITH CONTRAST  IMPRESSION: 1. Asymmetric, masslike density of the glandular tissue of the right breast with overlying skin thickening, in keeping with known primary breast malignancy. 2. Enlarged, ill-defined right axillary lymph node containing a biopsy marking clip, consistent with known nodal metastatic disease. 3. No other evidence of lymphadenopathy or metastatic disease in the chest, abdomen, or pelvis. 4. Emphysema. Background of fine centrilobular nodularity, most concentrated in the lung apices, consistent with smoking-related respiratory bronchiolitis. 5. Aneurysm of the infrarenal abdominal aorta  measuring up to 5.4 x 5.3 cm with a large burden of eccentric mural thrombus. Recommend follow-up CT/MR every 6 months and vascular consultation. This recommendation follows ACR consensus guidelines: White Paper of the ACR Incidental Findings Committee II on Vascular Findings. J Am Coll Radiol 2013; 10:789-794. 6. Coronary artery disease.   03/05/2022 Imaging   EXAM: NUCLEAR MEDICINE WHOLE BODY BONE SCAN  IMPRESSION: No evidence of bony metastatic disease.   03/07/2022 - 08/01/2022 Chemotherapy   Patient is on Treatment Plan : BREAST  Docetaxel + Carboplatin + Trastuzumab + Pertuzumab  (TCHP) q21d      07/26/2022 Surgery   Right mastectomy with axillary node seed guided excision converted to axillary node dissection with reconstruction by Drs. Donne Hazel and Thimmappa   07/26/2022 Pathology Results   FINAL MICROSCOPIC DIAGNOSIS:   A. LYMPH NODE, RIGHT AXILLARY TARGETED, EXCISION:  - Metastatic carcinoma involving one lymph node, 2 cm (1/1).  - Focal extranodal extension.  - Biopsy site and biopsy clip.   B. BREAST, RIGHT, MASTECTOMY:  - Invasive lobular carcinoma, 9.5 cm (ypT3).  - Carcinoma involves dermis of nipple.  - All surgical margins negative for carcinoma.  - One lymph node negative for metastatic carcinoma (0/1).  - Biopsy sites and biopsy clips.  - See oncology table.   C. LYMPH NODE, RIGHT AXILLARY, SENTINEL, EXCISION:  - One lymph node negative for metastatic carcinoma (0/1).   D. LYMPH NODE, RIGHT AXILLARY, SENTINEL, EXCISION:  - Metastatic carcinoma in one lymph node, 0.5 cm. (1/1).   E. LYMPH NODE, RIGHT AXILLARY, SENTINEL, EXCISION:  - One lymph node negative for metastatic carcinoma (0/1).    07/26/2022 Cancer Staging   Cancer Staging  Malignant neoplasm of overlapping sites of right breast in female, estrogen receptor positive ([place]) Staging form: Breast, AJCC 8th  Edition - Clinical stage from 02/09/2022: Stage IIIA (cT3, cN1, cM0, G2, ER+, PR-, HER2+) - Signed by  Truitt Merle, MD on 03/05/2022 Stage prefix: Initial diagnosis Histologic grading system: 3 grade system - Pathologic stage from 07/26/2022: Stage IIIA (pT3, pN1a, cM0, G2, ER+, PR-, HER2+) - Signed by Alla Feeling, NP on 08/01/2022 Histologic grading system: 3 grade system    07/26/2022 Cancer Staging   Staging form: Breast, AJCC 8th Edition - Pathologic stage from 07/26/2022: Stage IIIA (pT3, pN1a, cM0, G2, ER+, PR-, HER2+) - Signed by Alla Feeling, NP on 08/01/2022 Histologic grading system: 3 grade system   08/24/2022 -  Chemotherapy   Patient is on Treatment Plan : BREAST ADO-Trastuzumab Emtansine (Kadcyla) q21d        INTERVAL HISTORY: *** Ashley Robertson is here for a follow up of right breast cancer   She was last seen by me on 12/06/2022 She presents to the clinic       All other systems were reviewed with the patient and are negative.  MEDICAL HISTORY:  Past Medical History:  Diagnosis Date   Allergy    Lisinopril and lipitor   Anxiety and depression    Aortic atherosclerosis ([place]) 03/2020   CT Chest: 2 V (LAD & LCx) Coronary Atherosclerosis, Aortic Atherosclerosis (no aneurysm).  Mild centrilobular emphysema with mild diffuse bronchial thickening; several scattered small solitary pulmonary nodules (largest 5.6 mm in anterior left upper lobe)   Breast cancer ([place]) 12/2021   Right breast ILC   Carotid artery plaque, bilateral 10/2015   Mild to moderate plaque L>R without significant stenosis   COPD (chronic obstructive pulmonary disease) (HCC)    Coronary Artery Calcification - Score 79    Coronary Calcium Score 79.  LAD and circumflex calcification noted.  Normal ascending aorta with mild calcification.   Current every day smoker    pt quit smoking April 2023   Emphysema lung [facility])    Noted on chest CT   Family history of breast cancer 02/21/2022   Family history of prostate cancer 02/21/2022   Hyperlipidemia    Hypertension    Controlled with amlodipine    Prediabetes    Skin cancer 2021   Remove 2022    SURGICAL HISTORY: Past Surgical History:  Procedure Laterality Date   BREAST BIOPSY Right    times 3   BREAST RECONSTRUCTION WITH PLACEMENT OF TISSUE EXPANDER AND ALLODERM Right 07/26/2022   Procedure: RIGHT BREAST RECONSTRUCTION WITH PLACEMENT OF TISSUE EXPANDER AND ALLODERM;  Surgeon: Irene Limbo, MD;  Location: [facility];  Service: Plastics;  Laterality: Right;   [place]   Rhinoplasty   MASTECTOMY Right    MASTECTOMY W/ SENTINEL NODE BIOPSY Right 07/26/2022   Procedure: RIGHT MASTECTOMY, RIGHT AXILLARY SENTINEL NODE BIOPSY;  Surgeon: Rolm Bookbinder, MD;  Location: [facility];  Service: General;  Laterality: Right;  GEN & PEC BLOCK   PORTACATH PLACEMENT Left 03/06/2022   Procedure: INSERTION PORT-A-CATH;  Surgeon: Rolm Bookbinder, MD;  Location: [facility];  Service: General;  Laterality: Left;   RADIOACTIVE SEED GUIDED AXILLARY SENTINEL LYMPH NODE Right 07/26/2022   Procedure: RADIOACTIVE SEED GUIDED AXILLARY SENTINEL LYMPH NODE DISSECTION;  Surgeon: Rolm Bookbinder, MD;  Location: [facility];  Service: General;  Laterality: Right;   [place] ECHOCARDIOGRAM  10/2016   EF 55 to 60%.  Normal systolic and diastolic function.  No ASD/PFO   TUBAL LIGATION  1991    I have reviewed the social history and family history with the patient and they are unchanged from previous note.  ALLERGIES:  is allergic to lisinopril and lipitor [atorvastatin].  MEDICATIONS:  Current Outpatient Medications  Medication Sig Dispense Refill   albuterol (PROVENTIL HFA) 108 (90 Base) MCG/ACT inhaler Inhale 1 to 2 puffs into the lungs every 6 hours as needed 8.5 g 2   amLODipine (NORVASC) 10 MG tablet Take 1 tablet (10 mg total) by mouth at bedtime. 90 tablet 3   anastrozole (ARIMIDEX) 1 MG tablet Take 1 tablet (1 mg total) by mouth daily. 30 tablet  2   aspirin 81 MG tablet Take 81 mg by mouth at bedtime.     Calcium Carb-Cholecalciferol (CALCIUM 500 + D3 PO) Take 1 tablet by mouth daily.     Cholecalciferol (VITAMIN D) 50 MCG (2000 UT) CAPS Take 1 capsule (2,000 Units total) by mouth daily. 30 capsule 2   FLUoxetine (PROZAC) 20 MG capsule Take 1 capsule (20 mg total) by mouth daily. 90 capsule 1   Fluticasone-Umeclidin-Vilant (TRELEGY ELLIPTA) 200-62.5-25 MCG/ACT AEPB Inhale 1 puff into the lungs daily. 14 each 0   Fluticasone-Umeclidin-Vilant (TRELEGY ELLIPTA) 200-62.5-25 MCG/ACT AEPB Inhale 1 puff into the lungs daily. 60 each 2   OVER THE COUNTER MEDICATION Take 1 capsule by mouth daily. Serrapeptase 120,000 units daily  Reported on 12/27/2015     rosuvastatin (CRESTOR) 5 MG tablet Take 1 tablet by mouth daily at bedtime ,except on Monday, Wednesday and Fridays take 2 tablets at bedtime as directed 180 tablet 3   Specialty Vitamins Products (MAGNESIUM, AMINO ACID CHELATE,) 133 MG tablet Take 1 tablet by mouth at bedtime. PURE magnesium supplement - 120 mg of mag per capsule     No current facility-administered medications for this visit.    PHYSICAL EXAMINATION: ECOG PERFORMANCE STATUS: {CHL ONC ECOG [phone]}  There were no vitals filed for this visit. Wt Readings from Last 3 Encounters:  12/24/22 156 lb (70.8 kg)  12/06/22 161 lb 3.2 oz (73.1 kg)  11/14/22 162 lb (73.5 kg)    {Only keep what was examined. If exam not performed, can use .CEXAM } GENERAL:alert, no distress and comfortable SKIN: skin color, texture, turgor are normal, no rashes or significant lesions EYES: normal, Conjunctiva are pink and non-injected, sclera clear {OROPHARYNX:no exudate, no erythema and lips, buccal mucosa, and tongue normal}  NECK: supple, thyroid normal size, non-tender, without nodularity LYMPH:  no palpable lymphadenopathy in the cervical, axillary {or inguinal} LUNGS: clear to auscultation and percussion with normal breathing  effort HEART: regular rate & rhythm and no murmurs and no lower extremity edema ABDOMEN:abdomen soft, non-tender and normal bowel sounds Musculoskeletal:no cyanosis of digits and no clubbing  NEURO: alert & oriented x 3 with fluent speech, no focal motor/sensory deficits  LABORATORY DATA:  I have reviewed the data as listed    Latest Ref Rng & Units 12/06/2022   12:26 PM 11/14/2022   11:54 AM 10/22/2022    7:40 AM  CBC  WBC 4.0 - 10.5 K/uL 4.5  5.3  4.6   Hemoglobin 12.0 - 15.0 g/dL 13.7  13.5  13.9   Hematocrit 36.0 - 46.0 % 39.7  39.5  40.8   Platelets 150 - 400 K/uL 178  221  182         Latest Ref Rng & Units 12/06/2022   12:26 PM 11/14/2022   11:54 AM 10/22/2022  7:40 AM  CMP  Glucose 70 - 99 mg/dL 93  107  114   BUN 8 - 23 mg/dL '14  11  12   '$ Creatinine 0.44 - 1.00 mg/dL 0.62  0.67  0.63   Sodium 135 - 145 mmol/L 135  133  136   Potassium 3.5 - 5.1 mmol/L 3.6  4.1  4.0   Chloride 98 - 111 mmol/L 100  99  101   CO2 22 - 32 mmol/L '28  27  29   '$ Calcium 8.9 - 10.3 mg/dL 9.4  9.7  9.6   Total Protein 6.5 - 8.1 g/dL 7.0  7.1  7.5   Total Bilirubin 0.3 - 1.2 mg/dL 0.5  0.5  0.4   Alkaline Phos 38 - 126 U/L 126  127  109   AST 15 - 41 U/L '26  24  23   '$ ALT 0 - 44 U/L '19  18  17       '$ RADIOGRAPHIC STUDIES: I have personally reviewed the radiological images as listed and agreed with the findings in the report. No results found.    No orders of the defined types were placed in this encounter.  All questions were answered. The patient knows to call the clinic with any problems, questions or concerns. No barriers to learning was detected. The total time spent in the appointment was {CHL ONC TIME VISIT - [id number].     Baldemar Friday, CMA 12/25/2022   I, Audry Riles, CMA, am acting as scribe for Truitt Merle, MD.   {Add scribe attestation statement}

## 2022-12-26 ENCOUNTER — Inpatient Hospital Stay: Payer: PPO

## 2022-12-26 ENCOUNTER — Other Ambulatory Visit: Payer: Self-pay

## 2022-12-26 ENCOUNTER — Other Ambulatory Visit: Payer: Self-pay | Admitting: Pulmonary Disease

## 2022-12-26 ENCOUNTER — Other Ambulatory Visit (HOSPITAL_COMMUNITY): Payer: Self-pay

## 2022-12-26 ENCOUNTER — Inpatient Hospital Stay: Payer: PPO | Admitting: Hematology

## 2022-12-26 MED ORDER — TRELEGY ELLIPTA 200-62.5-25 MCG/ACT IN AEPB
1.0000 | INHALATION_SPRAY | Freq: Every day | RESPIRATORY_TRACT | 2 refills | Status: DC
  Filled 2022-12-26 – 2022-12-31 (×2): qty 60, 30d supply, fill #0
  Filled 2023-01-27: qty 60, 30d supply, fill #1
  Filled 2023-02-26: qty 60, 30d supply, fill #2

## 2022-12-31 ENCOUNTER — Other Ambulatory Visit: Payer: Self-pay

## 2022-12-31 ENCOUNTER — Other Ambulatory Visit (HOSPITAL_COMMUNITY): Payer: Self-pay

## 2022-12-31 MED ORDER — CARVEDILOL 6.25 MG PO TABS
6.2500 mg | ORAL_TABLET | Freq: Two times a day (BID) | ORAL | 3 refills | Status: DC
  Filled 2022-12-31: qty 60, 30d supply, fill #0
  Filled 2023-01-27: qty 60, 30d supply, fill #1
  Filled 2023-02-26: qty 60, 30d supply, fill #2
  Filled 2023-04-03: qty 60, 30d supply, fill #3

## 2022-12-31 NOTE — Progress Notes (Unsigned)
Cardiology Office Note:    Date:  01/02/2023   ID:  Ashley Robertson, DOB May 11, 1956, MRN [id number]  PCP:  Cari Caraway, [place] Providers Cardiologist:  Glenetta Hew, MD     Referring MD: Cari Caraway, MD   Chief Complaint  Patient presents with   [facility]- on cardiotoxic cancer therapy  History of Present Illness:    Ashley Robertson is a 67 y.o. female with a hx of HER2+, ER+ R breast cancer diagnosed 01/2022, on Her2 therapy since 03/2022 s/p mastemctomy, XRT 09/11/2022-10/24/2022, HTN, referral to cardio-onc clinic from Dr. Burr Medico. She is doing well on her2. Her blood pressure has been elevated. Her last echo read as EF decline 50-55, looks closer to 55-60.  Her GLS was normal. I recommended started carvedilol for cardioprotection. Recommended to continue her2 therapy.   She was following Dr. Ellyn Hack for elevated CAC < 100. LDL goal <70. She saw Caron Presume. Dicussed infrarenal AA 5.4 x5.3 cm with a large burden of eccentric mural thrombus in April 2023, repeat shows infrarenal AA up to 5.7 cm, she is followed by vascular sx, she is planned for open repair.  No cardiac disease hx. She denies chest pain or SOB. No orthopnea/PND. No LE edema  She has grandkids, she is active with them.   Father had hypertension. Mother is healthy. Sister with hypertension. Brother had an MI while undergoing surgery for UC  The 10-year ASCVD risk score (Arnett DK, et al., 2019) is: 7.8%   Values used to calculate the score:     Age: 66 years     Sex: Female     Is Non-Hispanic African American: No     Diabetic: No     Tobacco smoker: No     Systolic Blood Pressure: 347 mmHg     Is BP treated: Yes     HDL Cholesterol: 68 mg/dL     Total Cholesterol: 169 mg/dL   EKG 08/09/2022- NSR   Cardiology Studies: TTE: 10/29/2016- EF 55-60%, no valve disease 03/05/2022-EF 55-60%, GLS attempted 07/04/2022- EF nl, GLS -14.7% 09/19/2022- EF  nl 12/19/2022-  EF estimated 50-55, challenging windows, looks closer to 55-60. GLS -19% normal  03/24/2020- coronary CTA- CAC 65 , 80th percentile, LAD and Lcx CAC  Past Medical History:  Diagnosis Date   Allergy    Lisinopril and lipitor   Anxiety and depression    Aortic atherosclerosis ([place]) 03/2020   CT Chest: 2 V (LAD & LCx) Coronary Atherosclerosis, Aortic Atherosclerosis (no aneurysm).  Mild centrilobular emphysema with mild diffuse bronchial thickening; several scattered small solitary pulmonary nodules (largest 5.6 mm in anterior left upper lobe)   Breast cancer ([place]) 12/2021   Right breast ILC   Carotid artery plaque, bilateral 10/2015   Mild to moderate plaque L>R without significant stenosis   COPD (chronic obstructive pulmonary disease) (HCC)    Coronary Artery Calcification - Score 79    Coronary Calcium Score 79.  LAD and circumflex calcification noted.  Normal ascending aorta with mild calcification.   Current every day smoker    pt quit smoking April 2023   Emphysema lung [facility])    Noted on chest CT   Family history of breast cancer 02/21/2022   Family history of prostate cancer 02/21/2022   Hyperlipidemia    Hypertension    Controlled with amlodipine   Prediabetes    Skin cancer 2021   Remove 2022    Past Surgical History:  Procedure Laterality Date   BREAST BIOPSY Right    times 3   BREAST RECONSTRUCTION WITH PLACEMENT OF TISSUE EXPANDER AND ALLODERM Right 07/26/2022   Procedure: RIGHT BREAST RECONSTRUCTION WITH PLACEMENT OF TISSUE EXPANDER AND ALLODERM;  Surgeon: Irene Limbo, MD;  Location: [place];  Service: Plastics;  Laterality: Right;   [place]   Rhinoplasty   MASTECTOMY Right    MASTECTOMY W/ SENTINEL NODE BIOPSY Right 07/26/2022   Procedure: RIGHT MASTECTOMY, RIGHT AXILLARY SENTINEL NODE BIOPSY;  Surgeon: Rolm Bookbinder, MD;  Location: [place];  Service: General;  Laterality: Right;  GEN & PEC  BLOCK   PORTACATH PLACEMENT Left 03/06/2022   Procedure: INSERTION PORT-A-CATH;  Surgeon: Rolm Bookbinder, MD;  Location: [place];  Service: General;  Laterality: Left;   RADIOACTIVE SEED GUIDED AXILLARY SENTINEL LYMPH NODE Right 07/26/2022   Procedure: RADIOACTIVE SEED GUIDED AXILLARY SENTINEL LYMPH NODE DISSECTION;  Surgeon: Rolm Bookbinder, MD;  Location: [place];  Service: General;  Laterality: Right;   [place] ECHOCARDIOGRAM  10/2016   EF 55 to 60%.  Normal systolic and diastolic function.  No ASD/PFO   TUBAL LIGATION  1991    Current Medications: Current Outpatient Medications on File Prior to Visit  Medication Sig Dispense Refill   albuterol (PROVENTIL HFA) 108 (90 Base) MCG/ACT inhaler Inhale 1 to 2 puffs into the lungs every 6 hours as needed 8.5 g 2   amLODipine (NORVASC) 10 MG tablet Take 1 tablet (10 mg total) by mouth at bedtime. 90 tablet 3   anastrozole (ARIMIDEX) 1 MG tablet Take 1 tablet (1 mg total) by mouth daily. 30 tablet 2   aspirin 81 MG tablet Take 81 mg by mouth at bedtime.     Calcium Carb-Cholecalciferol (CALCIUM 500 + D3 PO) Take 1 tablet by mouth daily.     carvedilol (COREG) 6.25 MG tablet Take 1 tablet (6.25 mg total) by mouth 2 (two) times daily. 60 tablet 3   Cholecalciferol (VITAMIN D) 50 MCG (2000 UT) CAPS Take 1 capsule (2,000 Units total) by mouth daily. 30 capsule 2   FLUoxetine (PROZAC) 20 MG capsule Take 1 capsule (20 mg total) by mouth daily. 90 capsule 1   Fluticasone-Umeclidin-Vilant (TRELEGY ELLIPTA) 200-62.5-25 MCG/ACT AEPB Inhale 1 puff into the lungs daily. 60 each 2   OVER THE COUNTER MEDICATION Take 1 capsule by mouth daily. Serrapeptase 120,000 units daily  Reported on 12/27/2015     rosuvastatin (CRESTOR) 5 MG tablet Take 1 tablet by mouth daily at bedtime ,except on Monday, Wednesday and Fridays take 2 tablets at bedtime as directed 180 tablet 3   Specialty Vitamins  Products (MAGNESIUM, AMINO ACID CHELATE,) 133 MG tablet Take 1 tablet by mouth at bedtime. PURE magnesium supplement - 120 mg of mag per capsule     [DISCONTINUED] hydrochlorothiazide (HYDRODIURIL) 25 MG tablet Take 1 tablet (25 mg total) by mouth daily. 30 tablet 6   [DISCONTINUED] lisinopril (ZESTRIL) 5 MG tablet Take 1 tablet (5 mg total) by mouth daily. 30 tablet 1   No current facility-administered medications on file prior to visit.    Allergies:   Lisinopril and Lipitor [atorvastatin]   Social History   Socioeconomic History   Marital status: Married    Spouse name: Not on file   Number of children: 2   Years of education: Not on file   Highest education level: Bachelor's  degree (e.g., BA, AB, BS)  Occupational History   Occupation: OR Optician, dispensing: [employer]    Comment: Retired  Tobacco Use   Smoking status: Former    Packs/day: 1.00    Years: 40.00    Total pack years: 40.00    Types: Cigarettes    Quit date: 04/01/2022    Years since quitting: 0.7   Smokeless tobacco: Never  Vaping Use   Vaping Use: Some days   Devices: zero nicotine vape  Substance and Sexual Activity   Alcohol use: Yes    Alcohol/week: 6.0 standard drinks of alcohol    Types: 6 Glasses of wine per week    Comment: social   Drug use: No   Sexual activity: Not Currently    Birth control/protection: Post-menopausal, Surgical    Comment: BTL  Other Topics Concern   Not on file  Social History Narrative   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  She used to work at [employer].     Has 2 daughters.   Quit smoking 04/01/22.   Drinks 4 to 6 cups of coffee a day.      Currently trying Intermittent Fasting diet for weight loss.  Does not routinely exercise.   Social Determinants of Health   Financial Resource Strain: Low Risk  (02/21/2022)   Overall Financial Resource Strain (CARDIA)    Difficulty of Paying Living  Expenses: Not hard at all  Food Insecurity: No Food Insecurity (02/21/2022)   Hunger Vital Sign    Worried About Running Out of Food in the Last Year: Never true    Ran Out of Food in the Last Year: Never true  Transportation Needs: No Transportation Needs (02/21/2022)   PRAPARE - Hydrologist (Medical): No    Lack of Transportation (Non-Medical): No  Physical Activity: Not on file  Stress: Not on file  Social Connections: Not on file     Family History: The patient's family history includes Breast cancer in her niece and paternal aunt; Cancer - Other in her father; Healthy in her brother, brother, and sister; Hyperlipidemia in her father; Hypertension in her father and sister; Liver cancer in her maternal grandfather; Lung cancer in her brother and maternal grandmother; Parkinson's disease in her father; Prostate cancer in her father; Skin cancer in her maternal aunt; Transient ischemic attack (age of onset: 64) in her mother.  ROS:   Please see the history of present illness.     All other systems reviewed and are negative.  EKGs/Labs/Other Studies Reviewed:    The following studies were reviewed today:   EKG:     Recent Labs: 12/06/2022: ALT 19; BUN 14; Creatinine 0.62; Hemoglobin 13.7; Platelet Count 178; Potassium 3.6; Sodium 135   Recent Lipid Panel    Component Value Date/Time   CHOL 169 08/03/2020 0832   TRIG 56 08/03/2020 0832   HDL 68 08/03/2020 0832   CHOLHDL 2.5 08/03/2020 0832   LDLCALC 90 08/03/2020 0832     Risk Assessment/Calculations:     Physical Exam:    VS:  Vitals:   01/02/23 0748  BP: 126/66  Pulse: (!) 56    Wt Readings from Last 3 Encounters:  01/02/23 159 lb (72.1 kg)  12/24/22 156 lb (70.8 kg)  12/06/22 161 lb 3.2 oz (73.1 kg)     GEN:  Well nourished, well developed in no  acute distress HEENT: Normal NECK: No JVD; No carotid bruits LYMPHATICS: No lymphadenopathy CARDIAC: RRR, no murmurs, rubs,  gallops RESPIRATORY:  Clear to auscultation without rales, wheezing or rhonchi  ABDOMEN: Soft, non-tender, non-distended MUSCULOSKELETAL:  No edema; No deformity  SKIN: Warm and dry NEUROLOGIC:  Alert and oriented x 3 PSYCHIATRIC:  Normal affect   ASSESSMENT:   Cardio Onc: on her 2 since 03/2022-planned until September, 2024 (switched therapy in September 2023; was on pertuzumab and trastuzumab without great response to therapy; changed to [place] in September). Started cardioprotective agent for HTN, coreg in January 2024. GLS is normal. EF is preserved. - She can continue her2 therapy.  -Will plan to schedule her echoes , next 03/2023 - BNP today (biomarker surveillance)  Elevated CAC: continue crestor 5 mg (does 10 mg some days).  -Will repeat lipid profile to check LDL today; can discuss PCSK9 on follow-up after her surgery  HTN: well controlled. Continue coreg 6.25 mg BID and norvasc 10 mg daily. BP goal <130/80 mmHg  Infrarenal AA: up to 5.7 cm 12/21/2022; followed by vascular sx. She is planned for open repair 01/11/2023  Smoking: stopped smoking breast cancer diagnosis  PLAN:    In order of problems listed above:  GLS limited TTE 03/2023 BNP, Fasting lipid profile She is acceptable cardiac risk for open infrarenal AA repair Follow up 6 months      Medication Adjustments/Labs and Tests Ordered: Current medicines are reviewed at length with the patient today.  Concerns regarding medicines are outlined above.  Orders Placed This Encounter  Procedures   Brain natriuretic peptide   Lipid panel   ECHOCARDIOGRAM LIMITED   No orders of the defined types were placed in this encounter.   Patient Instructions  Medication Instructions:  No changes *If you need a refill on your cardiac medications before your next appointment, please call your pharmacy*   Lab Work: Lipid panel and BNP If you have labs (blood work) drawn today and your tests are completely normal, you will  receive your results only by: [place] (if you have MyChart) OR A paper copy in the mail If you have any lab test that is abnormal or we need to change your treatment, we will call you to review the results.   Testing/Procedures: Your physician has requested that you have an echocardiogram in April. Echocardiography is a painless test that uses sound waves to create images of your heart. It provides your doctor with information about the size and shape of your heart and how well your heart's chambers and valves are working. This procedure takes approximately one hour. There are no restrictions for this procedure. Please do NOT wear cologne, perfume, aftershave, or lotions (deodorant is allowed). Please arrive 15 minutes prior to your appointment time.    Follow-Up: At [facility], you and your health needs are our priority.  As part of our continuing mission to provide you with exceptional heart care, we have created designated Provider Care Teams.  These Care Teams include your primary Cardiologist (physician) and Advanced Practice Providers (APPs -  Physician Assistants and Nurse Practitioners) who all work together to provide you with the care you need, when you need it.  We recommend signing up for the patient portal called "MyChart".  Sign up information is provided on this After Visit Summary.  MyChart is used to connect with patients for Virtual Visits (Telemedicine).  Patients are able to view lab/test results, encounter notes, upcoming appointments, etc.  Non-urgent messages  can be sent to your provider as well.   To learn more about what you can do with MyChart, go to [email or web address].    Your next appointment:   6 month(s)  Provider:   Dr Harl Bowie      Signed, Janina Mayo, MD  01/02/2023 8:57 AM    [place]

## 2023-01-02 ENCOUNTER — Encounter: Payer: Self-pay | Admitting: Internal Medicine

## 2023-01-02 ENCOUNTER — Other Ambulatory Visit: Payer: Self-pay | Admitting: Surgery

## 2023-01-02 ENCOUNTER — Ambulatory Visit: Payer: PPO | Attending: Internal Medicine | Admitting: Internal Medicine

## 2023-01-02 VITALS — BP 126/66 | HR 56 | Ht 69.5 in | Wt 159.0 lb

## 2023-01-02 DIAGNOSIS — E785 Hyperlipidemia, unspecified: Secondary | ICD-10-CM | POA: Diagnosis not present

## 2023-01-02 DIAGNOSIS — C50811 Malignant neoplasm of overlapping sites of right female breast: Secondary | ICD-10-CM | POA: Diagnosis not present

## 2023-01-02 DIAGNOSIS — Z170 Estrogen receptor positive status [ER+]: Secondary | ICD-10-CM | POA: Diagnosis not present

## 2023-01-02 NOTE — Patient Instructions (Signed)
Medication Instructions:  No changes *If you need a refill on your cardiac medications before your next appointment, please call your pharmacy*   Lab Work: Lipid panel and BNP If you have labs (blood work) drawn today and your tests are completely normal, you will receive your results only by: [place] (if you have MyChart) OR A paper copy in the mail If you have any lab test that is abnormal or we need to change your treatment, we will call you to review the results.   Testing/Procedures: Your physician has requested that you have an echocardiogram in April. Echocardiography is a painless test that uses sound waves to create images of your heart. It provides your doctor with information about the size and shape of your heart and how well your heart's chambers and valves are working. This procedure takes approximately one hour. There are no restrictions for this procedure. Please do NOT wear cologne, perfume, aftershave, or lotions (deodorant is allowed). Please arrive 15 minutes prior to your appointment time.    Follow-Up: At [facility], you and your health needs are our priority.  As part of our continuing mission to provide you with exceptional heart care, we have created designated Provider Care Teams.  These Care Teams include your primary Cardiologist (physician) and Advanced Practice Providers (APPs -  Physician Assistants and Nurse Practitioners) who all work together to provide you with the care you need, when you need it.  We recommend signing up for the patient portal called "MyChart".  Sign up information is provided on this After Visit Summary.  MyChart is used to connect with patients for Virtual Visits (Telemedicine).  Patients are able to view lab/test results, encounter notes, upcoming appointments, etc.  Non-urgent messages can be sent to your provider as well.   To learn more about what you can do with MyChart, go to [email or web address].    Your next  appointment:   6 month(s)  Provider:   Dr Harl Bowie

## 2023-01-02 NOTE — Progress Notes (Signed)
Surgical Instructions    Your procedure is scheduled on Friday, 01/11/23.  Report to [facility] Main Entrance "A" at 5:30 A.M., then check in with the Admitting office.  Call this number if you have problems the morning of surgery:  [phone]   If you have any questions prior to your surgery date call [phone]: Open Monday-Friday 8am-4pm If you experience any cold or flu symptoms such as cough, fever, chills, shortness of breath, etc. between now and your scheduled surgery, please notify us at the above number     Remember:  Do not eat or drink after midnight the night before your surgery     Take these medicines the morning of surgery with A SIP OF WATER:  anastrozole (ARIMIDEX)  carvedilol (COREG)  FLUoxetine (PROZAC)  Fluticasone-Umeclidin-Vilant (TRELEGY ELLIPTA)   IF NEEDED: albuterol (PROVENTIL HFA) inhaler- bring with you the day of surgery  As of today, STOP taking any Aspirin (unless otherwise instructed by your surgeon) Aleve, Naproxen, Ibuprofen, Motrin, Advil, Goody's, BC's, all herbal medications, fish oil, and all vitamins.           Do not wear jewelry or makeup. Do not wear lotions, powders, perfumes or deodorant. Do not shave 48 hours prior to surgery.   Do not bring valuables to the hospital. Do not wear nail polish, gel polish, artificial nails, or any other type of covering on natural nails (fingers and toes) If you have artificial nails or gel coating that need to be removed by a nail salon, please have this removed prior to surgery. Artificial nails or gel coating may interfere with anesthesia's ability to adequately monitor your vital signs.  [facility] is not responsible for any belongings or valuables.    Do NOT Smoke (Tobacco/Vaping)  24 hours prior to your procedure  If you use a CPAP at night, you may bring your mask for your overnight stay.   Contacts, glasses, hearing aids, dentures or partials may not be worn into surgery, please bring  cases for these belongings   For patients admitted to the hospital, discharge time will be determined by your treatment team.   Patients discharged the day of surgery will not be allowed to drive home, and someone needs to stay with them for 24 hours.   SURGICAL WAITING ROOM VISITATION Patients having surgery or a procedure may have no more than 2 support people in the waiting area - these visitors may rotate.   Children under the age of 51 must have an adult with them who is not the patient. If the patient needs to stay at the hospital during part of their recovery, the visitor guidelines for inpatient rooms apply. Pre-op nurse will coordinate an appropriate time for 1 support person to accompany patient in pre-op.  This support person may not rotate.   Please refer to [email or web address] for the visitor guidelines for Inpatients (after your surgery is over and you are in a regular room).    Special instructions:    Oral Hygiene is also important to reduce your risk of infection.  Remember - BRUSH YOUR TEETH THE MORNING OF SURGERY WITH YOUR REGULAR TOOTHPASTE   [place]- Preparing For Surgery  Before surgery, you can play an important role. Because skin is not sterile, your skin needs to be as free of germs as possible. You can reduce the number of germs on your skin by washing with CHG (chlorahexidine gluconate) Soap before surgery.  CHG is an antiseptic cleaner which kills germs and  bonds with the skin to continue killing germs even after washing.     Please do not use if you have an allergy to CHG or antibacterial soaps. If your skin becomes reddened/irritated stop using the CHG.  Do not shave (including legs and underarms) for at least 48 hours prior to first CHG shower. It is OK to shave your face.  Please follow these instructions carefully.     Shower the NIGHT BEFORE SURGERY and the MORNING OF SURGERY with CHG Soap.   If  you chose to wash your hair, wash your hair first as usual with your normal shampoo. After you shampoo, rinse your hair and body thoroughly to remove the shampoo.  Then [employer] and genitals (private parts) with your normal soap and rinse thoroughly to remove soap.  After that Use CHG Soap as you would any other liquid soap. You can apply CHG directly to the skin and wash gently with a scrungie or a clean washcloth.   Apply the CHG Soap to your body ONLY FROM THE NECK DOWN.  Do not use on open wounds or open sores. Avoid contact with your eyes, ears, mouth and genitals (private parts). Wash Face and genitals (private parts)  with your normal soap.   Wash thoroughly, paying special attention to the area where your surgery will be performed.  Thoroughly rinse your body with warm water from the neck down.  DO NOT shower/wash with your normal soap after using and rinsing off the CHG Soap.  Pat yourself dry with a CLEAN TOWEL.  Wear CLEAN PAJAMAS to bed the night before surgery  Place CLEAN SHEETS on your bed the night before your surgery  DO NOT SLEEP WITH PETS.   Day of Surgery: Take a shower with CHG soap. Wear Clean/Comfortable clothing the morning of surgery Do not apply any deodorants/lotions.   Remember to brush your teeth WITH YOUR REGULAR TOOTHPASTE.    If you received a COVID test during your pre-op visit, it is requested that you wear a mask when out in public, stay away from anyone that may not be feeling well, and notify your surgeon if you develop symptoms. If you have been in contact with anyone that has tested positive in the last 10 days, please notify your surgeon.    Please read over the following fact sheets that you were given.

## 2023-01-03 ENCOUNTER — Other Ambulatory Visit: Payer: Self-pay

## 2023-01-03 ENCOUNTER — Telehealth: Payer: Self-pay

## 2023-01-03 DIAGNOSIS — I499 Cardiac arrhythmia, unspecified: Secondary | ICD-10-CM

## 2023-01-03 HISTORY — DX: Cardiac arrhythmia, unspecified: I49.9

## 2023-01-03 LAB — LIPID PANEL
Chol/HDL Ratio: 2.1 ratio (ref 0.0–4.4)
Cholesterol, Total: 181 mg/dL (ref 100–199)
HDL: 87 mg/dL (ref >39)
LDL Chol Calc (NIH): 79 mg/dL (ref 0–99)
Triglycerides: 85 mg/dL (ref 0–149)
VLDL Cholesterol Cal: 15 mg/dL (ref 5–40)

## 2023-01-03 LAB — BRAIN NATRIURETIC PEPTIDE: BNP: 61.7 pg/mL (ref 0.0–100.0)

## 2023-01-03 NOTE — Telephone Encounter (Signed)
Spoke with pt via telephone to inform pt that Dr. Burr Medico and Dr. Beckie Busing (Cardiology) have review the pt's recent ECHO.  Dr. Harl Bowie has cleared pt for her next treatment of ado-Trastuzumab [place].  Dr. Burr Medico was also in agreement.  Notified Pt that Dr. Burr Medico wants the pt to keep her infusion appt on 01/17/2023.  Pt verbalized understanding and had no further questions or concerns.

## 2023-01-04 ENCOUNTER — Other Ambulatory Visit: Payer: Self-pay

## 2023-01-04 ENCOUNTER — Encounter (HOSPITAL_COMMUNITY)
Admission: RE | Admit: 2023-01-04 | Discharge: 2023-01-04 | Disposition: A | Discharge status: Home / Self Care | Payer: PPO | Source: Ambulatory Visit | Attending: Surgery | Admitting: Surgery

## 2023-01-04 ENCOUNTER — Telehealth: Payer: Self-pay

## 2023-01-04 ENCOUNTER — Encounter (HOSPITAL_COMMUNITY): Payer: Self-pay

## 2023-01-04 VITALS — BP 138/69 | HR 51 | Temp 97.5°F | Resp 18 | Ht 69.0 in | Wt 161.0 lb

## 2023-01-04 DIAGNOSIS — C50911 Malignant neoplasm of unspecified site of right female breast: Secondary | ICD-10-CM | POA: Diagnosis not present

## 2023-01-04 DIAGNOSIS — Z9011 Acquired absence of right breast and nipple: Secondary | ICD-10-CM | POA: Insufficient documentation

## 2023-01-04 DIAGNOSIS — I7142 Juxtarenal abdominal aortic aneurysm, without rupture: Secondary | ICD-10-CM | POA: Insufficient documentation

## 2023-01-04 DIAGNOSIS — Z01818 Encounter for other preprocedural examination: Secondary | ICD-10-CM | POA: Insufficient documentation

## 2023-01-04 LAB — PROTIME-INR
INR: 1 (ref 0.8–1.2)
Prothrombin Time: 12.6 seconds (ref 11.4–15.2)

## 2023-01-04 LAB — COMPREHENSIVE METABOLIC PANEL
ALT: 22 U/L (ref 0–44)
AST: 33 U/L (ref 15–41)
Albumin: 3.7 g/dL (ref 3.5–5.0)
Alkaline Phosphatase: 122 U/L (ref 38–126)
Anion gap: 9 (ref 5–15)
BUN: 11 mg/dL (ref 8–23)
CO2: 29 mmol/L (ref 22–32)
Calcium: 9.6 mg/dL (ref 8.9–10.3)
Chloride: 97 mmol/L — ABNORMAL LOW (ref 98–111)
Creatinine, Ser: 0.67 mg/dL (ref 0.44–1.00)
GFR, Estimated: >60 mL/min (ref >60)
Glucose, Bld: 111 mg/dL — ABNORMAL HIGH (ref 70–99)
Potassium: 4 mmol/L (ref 3.5–5.1)
Sodium: 135 mmol/L (ref 135–145)
Total Bilirubin: 0.8 mg/dL (ref 0.3–1.2)
Total Protein: 7.7 g/dL (ref 6.5–8.1)

## 2023-01-04 LAB — CBC
HCT: 45.3 % (ref 36.0–46.0)
Hemoglobin: 14.9 g/dL (ref 12.0–15.0)
MCH: 32 pg (ref 26.0–34.0)
MCHC: 32.9 g/dL (ref 30.0–36.0)
MCV: 97.4 fL (ref 80.0–100.0)
Platelets: 113 10*3/uL — ABNORMAL LOW (ref 150–400)
RBC: 4.65 MIL/uL (ref 3.87–5.11)
RDW: 14.9 % (ref 11.5–15.5)
WBC: 4.8 10*3/uL (ref 4.0–10.5)
nRBC: 0 % (ref 0.0–0.2)

## 2023-01-04 LAB — SURGICAL PCR SCREEN
MRSA, PCR: NEGATIVE
Staphylococcus aureus: NEGATIVE

## 2023-01-04 LAB — URINALYSIS, ROUTINE W REFLEX MICROSCOPIC
Bilirubin Urine: NEGATIVE
Glucose, UA: NEGATIVE mg/dL
Hgb urine dipstick: NEGATIVE
Ketones, ur: NEGATIVE mg/dL
Leukocytes,Ua: NEGATIVE
Nitrite: NEGATIVE
Protein, ur: NEGATIVE mg/dL
Specific Gravity, Urine: 1.004 — ABNORMAL LOW (ref 1.005–1.030)
pH: 6 (ref 5.0–8.0)

## 2023-01-04 LAB — APTT: aPTT: 27 seconds (ref 24–36)

## 2023-01-04 NOTE — Telephone Encounter (Signed)
Pt called stating she just went to her pre-op appt today and they asked her to ask our office if she could go off of ASA until after her procedure.  Confirmed with Herma Ard, RN in surgery scheduling that ASA is never held for any procedures or surgeries.   Called pt, no answer, lf vm relaying info about not holding ASA.  Pt called asking about continuing or holding ASA.  Called pt, two identifiers used. Informed her that she should continue her ASA because it is never held for any of our surgeries. Confirmed understanding.

## 2023-01-04 NOTE — Progress Notes (Addendum)
PCP - Dr,Wendy Addison Lank Cardiologist - Dr.David Ellyn Hack, last ov was with Dr.Mary Branch  Oncologist-Dr.Yan Feng-right breat cancer.  Port a cath in place left chest per pt. PPM/ICD - pt denies Device Orders - n/a Rep Notified - n/a  Chest x-ray - n/a EKG - 08/09/22 Stress Test - pt denies ECHO - 12/19/22 Cardiac Cath - pt denies  Sleep Study - pt denies CPAP - n/a  Fasting Blood Sugar - pt is a pre-diabetic Pt does not check Blood Sugar at home  Last dose of GLP1 agonist-  pt denies GLP1 instructions: n/a  Blood Thinner Instructions:pt denies Aspirin Instructions: Follow your surgeon's instructions on when to stop Aspirin.  If no instructions were given by your surgeon then you will need to call the office to get those instructions.    ERAS Protcol -NPO after Midnight  PRE-SURGERY Ensure or G2- none ordered   COVID TEST- n/a   Anesthesia review: YES, current chemo patient. Patient for Abdominal Aortic Aneurysm repair. Patient states last chemo was December 06, 2022 and she has this every 3 weeks but it's on hold do to this surgery.   Patient denies shortness of breath, fever, cough and chest pain at PAT appointment   All instructions explained to the patient, with a verbal understanding of the material. Patient agrees to go over the instructions while at home for a better understanding. Patient also instructed to self quarantine after being tested for COVID-19. The opportunity to ask questions was provided.

## 2023-01-07 NOTE — Progress Notes (Signed)
Anesthesia Chart Review:  History of HER2+, ER+ R breast cancer diagnosed 01/2022, on Her2 therapy since 03/2022 s/p mastemctomy, XRT 09/11/2022-10/24/2022.  Patient is followed by Dr. Phineas Inches in the cardio oncology monitoring clinic.  Last seen 01/02/2023 for routine follow-up.  Per note, "She is doing well on her2. Her blood pressure has been elevated. Her last echo read as EF decline 50-55, looks closer to 55-60. Her GLS was normal. I recommended started carvedilol for cardioprotection. Recommended to continue her2 therapy."  It was also discussed that she was being followed by vascular surgery for AAA and that she was pending open repair on 01/11/2023.  Per note, "she is acceptable cardiac risk for open infrarenal AAA repair."  Former smoker, 40 pack years, quit 03/2022.  PFTs 04/10/2021 showed moderate obstruction with bronchodilator response.  She is followed by pulmonary for history of COPD and emphysema.  She is maintained on Trelegy inhaler.  Preop labs reviewed, unremarkable.  EKG 08/09/2022: NSR.  Rate 73.  Possible LAE.  TTE 12/19/2022:  1. Left ventricular ejection fraction, by estimation, is 50 to 55%. Left  ventricular ejection fraction by 3D volume is 51 %. The left ventricle has  low normal function. The left ventricle demonstrates regional wall motion  abnormalities (see scoring  diagram/findings for description). Left ventricular diastolic parameters  are consistent with Grade I diastolic dysfunction (impaired relaxation).  The average left ventricular global longitudinal strain is -19.6 %. The  global longitudinal strain is  normal.   2. Right ventricular systolic function is normal. The right ventricular  size is normal.   3. The mitral valve is normal in structure. No evidence of mitral valve  regurgitation. No evidence of mitral stenosis.   4. The aortic valve is tricuspid. There is mild calcification of the  aortic valve. Aortic valve regurgitation is not visualized.  Aortic valve  sclerosis is present, with no evidence of aortic valve stenosis.   5. The inferior vena cava is normal in size with greater than 50%  respiratory variability, suggesting right atrial pressure of 3 mmHg.   Comparison(s): No significant change from prior study. Prior images  reviewed side by side.   Carotid duplex 03/20/2021: Summary:  Right Carotid: Velocities in the right ICA are consistent with a 1-39% stenosis. Non-hemodynamically significant plaque <50% noted in the CCA. Velocities in the RICA are essentially stable compared to prior exam.  Left Carotid: Velocities in the left ICA are consistent with a 1-39% stenosis. Non-hemodynamically significant plaque <50% noted in the CCA. Velocities in the LICA are essentially stable compared to prior exam.  Vertebrals:  Bilateral vertebral arteries demonstrate antegrade flow.  Subclavians: Right subclavian artery flow was disturbed. Normal flow hemodynamics were seen in the left subclavian artery.    Wynonia Musty [facility] Short Stay Center/Anesthesiology Phone [phone] 01/07/2023 2:07 PM

## 2023-01-07 NOTE — Anesthesia Preprocedure Evaluation (Addendum)
Anesthesia Evaluation  Patient identified by MRN, date of birth, ID band Patient awake    Reviewed: Allergy & Precautions, NPO status , Patient's Chart, lab work & pertinent test results, reviewed documented beta blocker date and time   Airway Mallampati: II  TM Distance: >3 FB Neck ROM: Full    Dental  (+) Dental Advisory Given, Upper Dentures, Partial Lower   Pulmonary COPD, former smoker   Pulmonary exam normal breath sounds clear to auscultation       Cardiovascular hypertension, Pt. on medications and Pt. on home beta blockers + CAD and + Peripheral Vascular Disease (Juxtarenal abdominal aortic aneurysm without rupture)  Normal cardiovascular exam Rhythm:Regular Rate:Normal  Echo 1/17/234: 1. Left ventricular ejection fraction, by estimation, is 50 to 55%. Left  ventricular ejection fraction by 3D volume is 51 %. The left ventricle has  low normal function. The left ventricle demonstrates regional wall motion  abnormalities (see scoring  diagram/findings for description). Left ventricular diastolic parameters  are consistent with Grade I diastolic dysfunction (impaired relaxation).  The average left ventricular global longitudinal strain is -19.6 %. The  global longitudinal strain is  normal.   2. Right ventricular systolic function is normal. The right ventricular  size is normal.   3. The mitral valve is normal in structure. No evidence of mitral valve  regurgitation. No evidence of mitral stenosis.   4. The aortic valve is tricuspid. There is mild calcification of the  aortic valve. Aortic valve regurgitation is not visualized. Aortic valve  sclerosis is present, with no evidence of aortic valve stenosis.   5. The inferior vena cava is normal in size with greater than 50%  respiratory variability, suggesting right atrial pressure of 3 mmHg.     Neuro/Psych negative neurological ROS     GI/Hepatic negative GI ROS,  Neg liver ROS,,,  Endo/Other  negative endocrine ROS    Renal/GU negative Renal ROS     Musculoskeletal negative musculoskeletal ROS (+)    Abdominal   Peds  Hematology  (+) Blood dyscrasia (Plt 113k)   Anesthesia Other Findings R breast cancer  Reproductive/Obstetrics                             Anesthesia Physical Anesthesia Plan  ASA: 4  Anesthesia Plan: General   Post-op Pain Management: Tylenol PO (pre-op)*   Induction: Intravenous  PONV Risk Score and Plan: 3 and Midazolam, Dexamethasone and Ondansetron  Airway Management Planned: Oral ETT  Additional Equipment: Arterial line, CVP and Ultrasound Guidance Line Placement  Intra-op Plan:   Post-operative Plan: Possible Post-op intubation/ventilation  Informed Consent: I have reviewed the patients History and Physical, chart, labs and discussed the procedure including the risks, benefits and alternatives for the proposed anesthesia with the patient or authorized representative who has indicated his/her understanding and acceptance.     Dental advisory given  Plan Discussed with: CRNA  Anesthesia Plan Comments: (PAT note by Karoline Caldwell, PA-C: History of HER2+, ER+ R breast cancerdiagnosed 01/2022, onHer2 therapy since 4/2023s/p mastemctomy, XRT 09/11/2022-10/24/2022.  Patient is followed by Dr. Phineas Inches in the cardio oncology monitoring clinic.  Last seen 01/02/2023 for routine follow-up.  Per note, "She is doing well on her2. Her blood pressure has been elevated. Her last echo read as EF decline 50-55, looks closer to 55-60. Her GLS was normal. I recommended started carvedilol for cardioprotection. Recommended to continue her2 therapy."  It was also discussed that  she was being followed by vascular surgery for AAA and that she was pending open repair on 01/11/2023.  Per note, "she is acceptable cardiac risk for open infrarenal AAA repair."  Former smoker, 40 pack years, quit 03/2022.   PFTs 04/10/2021 showed moderate obstruction with bronchodilator response.  She is followed by pulmonary for history of COPD and emphysema.  She is maintained on Trelegy inhaler.  Preop labs reviewed, unremarkable.  EKG 08/09/2022: NSR.  Rate 73.  Possible LAE.  TTE 12/19/2022: 1. Left ventricular ejection fraction, by estimation, is 50 to 55%. Left  ventricular ejection fraction by 3D volume is 51 %. The left ventricle has  low normal function. The left ventricle demonstrates regional wall motion  abnormalities (see scoring  diagram/findings for description). Left ventricular diastolic parameters  are consistent with Grade I diastolic dysfunction (impaired relaxation).  The average left ventricular global longitudinal strain is -19.6 %. The  global longitudinal strain is  normal.  2. Right ventricular systolic function is normal. The right ventricular  size is normal.  3. The mitral valve is normal in structure. No evidence of mitral valve  regurgitation. No evidence of mitral stenosis.  4. The aortic valve is tricuspid. There is mild calcification of the  aortic valve. Aortic valve regurgitation is not visualized. Aortic valve  sclerosis is present, with no evidence of aortic valve stenosis.  5. The inferior vena cava is normal in size with greater than 50%  respiratory variability, suggesting right atrial pressure of 3 mmHg.   Comparison(s): No significant change from prior study. Prior images  reviewed side by side.   Carotid duplex 03/20/2021: Summary:  Right Carotid: Velocities in the right ICA are consistent with a 1-39% stenosis. Non-hemodynamically significant plaque <50% noted in the CCA. Velocities in the RICA are essentially stable compared to prior exam.  Left Carotid: Velocities in the left ICA are consistent with a 1-39% stenosis. Non-hemodynamically significant plaque <50% noted in the CCA. Velocities in the LICA are essentially stable compared to priorexam.   Vertebrals: Bilateral vertebral arteries demonstrate antegrade flow.  Subclavians: Right subclavian artery flow was disturbed. Normal flow hemodynamics were seen in the left subclavian artery.    )        Anesthesia Quick Evaluation

## 2023-01-10 ENCOUNTER — Encounter (HOSPITAL_COMMUNITY): Payer: Self-pay | Admitting: Surgery

## 2023-01-11 ENCOUNTER — Inpatient Hospital Stay (HOSPITAL_COMMUNITY): Payer: PPO

## 2023-01-11 ENCOUNTER — Inpatient Hospital Stay (HOSPITAL_COMMUNITY)
Admission: RE | Admit: 2023-01-11 | Discharge: 2023-01-15 | DRG: 271 | Disposition: A | Discharge status: Home Health | Payer: PPO | Attending: Surgery | Admitting: Surgery

## 2023-01-11 ENCOUNTER — Encounter (HOSPITAL_COMMUNITY): Payer: Self-pay | Admitting: Surgery

## 2023-01-11 ENCOUNTER — Inpatient Hospital Stay (HOSPITAL_COMMUNITY): Payer: PPO | Admitting: Certified Registered Nurse Anesthetist

## 2023-01-11 ENCOUNTER — Other Ambulatory Visit: Payer: Self-pay

## 2023-01-11 ENCOUNTER — Encounter (HOSPITAL_COMMUNITY)
Admission: RE | Disposition: A | Discharge status: Home Health | Payer: Self-pay | Source: Home / Self Care | Attending: Surgery

## 2023-01-11 DIAGNOSIS — Z800 Family history of malignant neoplasm of digestive organs: Secondary | ICD-10-CM | POA: Diagnosis not present

## 2023-01-11 DIAGNOSIS — I97191 Other postprocedural cardiac functional disturbances following other surgery: Secondary | ICD-10-CM | POA: Diagnosis not present

## 2023-01-11 DIAGNOSIS — Z801 Family history of malignant neoplasm of trachea, bronchus and lung: Secondary | ICD-10-CM | POA: Diagnosis not present

## 2023-01-11 DIAGNOSIS — I700 Atherosclerosis of aorta: Secondary | ICD-10-CM | POA: Diagnosis present

## 2023-01-11 DIAGNOSIS — Z8679 Personal history of other diseases of the circulatory system: Secondary | ICD-10-CM

## 2023-01-11 DIAGNOSIS — R7303 Prediabetes: Secondary | ICD-10-CM | POA: Diagnosis not present

## 2023-01-11 DIAGNOSIS — I10 Essential (primary) hypertension: Secondary | ICD-10-CM | POA: Diagnosis present

## 2023-01-11 DIAGNOSIS — Z8249 Family history of ischemic heart disease and other diseases of the circulatory system: Secondary | ICD-10-CM

## 2023-01-11 DIAGNOSIS — Z83438 Family history of other disorder of lipoprotein metabolism and other lipidemia: Secondary | ICD-10-CM | POA: Diagnosis not present

## 2023-01-11 DIAGNOSIS — I714 Abdominal aortic aneurysm, without rupture, unspecified: Secondary | ICD-10-CM

## 2023-01-11 DIAGNOSIS — J432 Centrilobular emphysema: Secondary | ICD-10-CM | POA: Diagnosis not present

## 2023-01-11 DIAGNOSIS — I2510 Atherosclerotic heart disease of native coronary artery without angina pectoris: Secondary | ICD-10-CM | POA: Diagnosis present

## 2023-01-11 DIAGNOSIS — Z888 Allergy status to other drugs, medicaments and biological substances status: Secondary | ICD-10-CM | POA: Diagnosis not present

## 2023-01-11 DIAGNOSIS — C50911 Malignant neoplasm of unspecified site of right female breast: Secondary | ICD-10-CM | POA: Diagnosis present

## 2023-01-11 DIAGNOSIS — Z9889 Other specified postprocedural states: Secondary | ICD-10-CM

## 2023-01-11 DIAGNOSIS — R918 Other nonspecific abnormal finding of lung field: Secondary | ICD-10-CM | POA: Diagnosis not present

## 2023-01-11 DIAGNOSIS — Z87891 Personal history of nicotine dependence: Secondary | ICD-10-CM

## 2023-01-11 DIAGNOSIS — Z79899 Other long term (current) drug therapy: Secondary | ICD-10-CM | POA: Diagnosis not present

## 2023-01-11 DIAGNOSIS — J449 Chronic obstructive pulmonary disease, unspecified: Secondary | ICD-10-CM

## 2023-01-11 DIAGNOSIS — I4891 Unspecified atrial fibrillation: Secondary | ICD-10-CM | POA: Diagnosis not present

## 2023-01-11 DIAGNOSIS — Z7982 Long term (current) use of aspirin: Secondary | ICD-10-CM | POA: Diagnosis not present

## 2023-01-11 DIAGNOSIS — I7142 Juxtarenal abdominal aortic aneurysm, without rupture: Principal | ICD-10-CM | POA: Diagnosis present

## 2023-01-11 DIAGNOSIS — Z9011 Acquired absence of right breast and nipple: Secondary | ICD-10-CM | POA: Diagnosis not present

## 2023-01-11 DIAGNOSIS — Z803 Family history of malignant neoplasm of breast: Secondary | ICD-10-CM

## 2023-01-11 DIAGNOSIS — Z820 Family history of epilepsy and other diseases of the nervous system: Secondary | ICD-10-CM | POA: Diagnosis not present

## 2023-01-11 DIAGNOSIS — I9789 Other postprocedural complications and disorders of the circulatory system, not elsewhere classified: Secondary | ICD-10-CM | POA: Diagnosis not present

## 2023-01-11 DIAGNOSIS — Z808 Family history of malignant neoplasm of other organs or systems: Secondary | ICD-10-CM

## 2023-01-11 DIAGNOSIS — E7800 Pure hypercholesterolemia, unspecified: Secondary | ICD-10-CM | POA: Diagnosis not present

## 2023-01-11 DIAGNOSIS — Z8042 Family history of malignant neoplasm of prostate: Secondary | ICD-10-CM

## 2023-01-11 DIAGNOSIS — Y838 Other surgical procedures as the cause of abnormal reaction of the patient, or of later complication, without mention of misadventure at the time of the procedure: Secondary | ICD-10-CM | POA: Diagnosis not present

## 2023-01-11 DIAGNOSIS — Z170 Estrogen receptor positive status [ER+]: Secondary | ICD-10-CM

## 2023-01-11 DIAGNOSIS — Z4682 Encounter for fitting and adjustment of non-vascular catheter: Secondary | ICD-10-CM | POA: Diagnosis not present

## 2023-01-11 DIAGNOSIS — I480 Paroxysmal atrial fibrillation: Secondary | ICD-10-CM | POA: Diagnosis not present

## 2023-01-11 DIAGNOSIS — Z452 Encounter for adjustment and management of vascular access device: Secondary | ICD-10-CM | POA: Diagnosis not present

## 2023-01-11 HISTORY — DX: Abdominal aortic aneurysm, without rupture, unspecified: I71.40

## 2023-01-11 HISTORY — PX: ABDOMINAL AORTIC ANEURYSM REPAIR: SHX42

## 2023-01-11 LAB — POCT I-STAT 7, (LYTES, BLD GAS, ICA,H+H)
Acid-base deficit: 1 mmol/L (ref 0.0–2.0)
Acid-base deficit: 3 mmol/L — ABNORMAL HIGH (ref 0.0–2.0)
Acid-base deficit: 4 mmol/L — ABNORMAL HIGH (ref 0.0–2.0)
Bicarbonate: 25.7 mmol/L (ref 20.0–28.0)
Bicarbonate: 22.6 mmol/L (ref 20.0–28.0)
Bicarbonate: 22.6 mmol/L (ref 20.0–28.0)
Calcium, Ion: 1.24 mmol/L (ref 1.15–1.40)
Calcium, Ion: 1.13 mmol/L — ABNORMAL LOW (ref 1.15–1.40)
Calcium, Ion: 1.15 mmol/L (ref 1.15–1.40)
HCT: 38 % (ref 36.0–46.0)
HCT: 27 % — ABNORMAL LOW (ref 36.0–46.0)
HCT: 32 % — ABNORMAL LOW (ref 36.0–46.0)
Hemoglobin: 12.9 g/dL (ref 12.0–15.0)
Hemoglobin: 9.2 g/dL — ABNORMAL LOW (ref 12.0–15.0)
Hemoglobin: 10.9 g/dL — ABNORMAL LOW (ref 12.0–15.0)
O2 Saturation: 100 %
O2 Saturation: 99 %
O2 Saturation: 99 %
Patient temperature: 36.6
Patient temperature: 36.5
Potassium: 4 mmol/L (ref 3.5–5.1)
Potassium: 4.4 mmol/L (ref 3.5–5.1)
Potassium: 4.4 mmol/L (ref 3.5–5.1)
Sodium: 140 mmol/L (ref 135–145)
Sodium: 138 mmol/L (ref 135–145)
Sodium: 140 mmol/L (ref 135–145)
TCO2: 27 mmol/L (ref 22–32)
TCO2: 24 mmol/L (ref 22–32)
TCO2: 24 mmol/L (ref 22–32)
pCO2 arterial: 50.6 mmHg — ABNORMAL HIGH (ref 32–48)
pCO2 arterial: 42.4 mmHg (ref 32–48)
pCO2 arterial: 45.7 mmHg (ref 32–48)
pH, Arterial: 7.314 — ABNORMAL LOW (ref 7.35–7.45)
pH, Arterial: 7.333 — ABNORMAL LOW (ref 7.35–7.45)
pH, Arterial: 7.3 — ABNORMAL LOW (ref 7.35–7.45)
pO2, Arterial: 244 mmHg — ABNORMAL HIGH (ref 83–108)
pO2, Arterial: 157 mmHg — ABNORMAL HIGH (ref 83–108)
pO2, Arterial: 183 mmHg — ABNORMAL HIGH (ref 83–108)

## 2023-01-11 LAB — BASIC METABOLIC PANEL
Anion gap: 10 (ref 5–15)
BUN: 10 mg/dL (ref 8–23)
CO2: 21 mmol/L — ABNORMAL LOW (ref 22–32)
Calcium: 7.8 mg/dL — ABNORMAL LOW (ref 8.9–10.3)
Chloride: 107 mmol/L (ref 98–111)
Creatinine, Ser: 0.71 mg/dL (ref 0.44–1.00)
GFR, Estimated: >60 mL/min (ref >60)
Glucose, Bld: 149 mg/dL — ABNORMAL HIGH (ref 70–99)
Potassium: 4.3 mmol/L (ref 3.5–5.1)
Sodium: 138 mmol/L (ref 135–145)

## 2023-01-11 LAB — BLOOD GAS, ARTERIAL
Acid-base deficit: 2.7 mmol/L — ABNORMAL HIGH (ref 0.0–2.0)
Bicarbonate: 22.6 mmol/L (ref 20.0–28.0)
Drawn by: 13405
O2 Saturation: 95.3 %
Patient temperature: 36.1
pCO2 arterial: 38 mmHg (ref 32–48)
pH, Arterial: 7.37 (ref 7.35–7.45)
pO2, Arterial: 73 mmHg — ABNORMAL LOW (ref 83–108)

## 2023-01-11 LAB — CBC
HCT: 35 % — ABNORMAL LOW (ref 36.0–46.0)
Hemoglobin: 11.7 g/dL — ABNORMAL LOW (ref 12.0–15.0)
MCH: 32.8 pg (ref 26.0–34.0)
MCHC: 33.4 g/dL (ref 30.0–36.0)
MCV: 98 fL (ref 80.0–100.0)
Platelets: 87 10*3/uL — ABNORMAL LOW (ref 150–400)
RBC: 3.57 MIL/uL — ABNORMAL LOW (ref 3.87–5.11)
RDW: 15.4 % (ref 11.5–15.5)
WBC: 8.9 10*3/uL (ref 4.0–10.5)
nRBC: 0 % (ref 0.0–0.2)

## 2023-01-11 LAB — POCT ACTIVATED CLOTTING TIME
Activated Clotting Time: 217 seconds
Activated Clotting Time: 223 seconds
Activated Clotting Time: 228 seconds
Activated Clotting Time: 239 seconds
Activated Clotting Time: 201 seconds
Activated Clotting Time: 217 seconds

## 2023-01-11 LAB — PROTIME-INR
INR: 1.2 (ref 0.8–1.2)
Prothrombin Time: 15.5 seconds — ABNORMAL HIGH (ref 11.4–15.2)

## 2023-01-11 LAB — MAGNESIUM: Magnesium: 1.7 mg/dL (ref 1.7–2.4)

## 2023-01-11 LAB — APTT: aPTT: 28 seconds (ref 24–36)

## 2023-01-11 LAB — ABO/RH: ABO/RH(D): A POS

## 2023-01-11 LAB — PREPARE RBC (CROSSMATCH)

## 2023-01-11 SURGERY — ANEURYSM ABDOMINAL AORTIC REPAIR
Anesthesia: General | Site: Abdomen

## 2023-01-11 MED ORDER — UMECLIDINIUM BROMIDE 62.5 MCG/ACT IN AEPB
1.0000 | INHALATION_SPRAY | Freq: Every day | RESPIRATORY_TRACT | Status: DC
  Administered 2023-01-12 – 2023-01-15 (×3): 1 via RESPIRATORY_TRACT
  Filled 2023-01-11: qty 7

## 2023-01-11 MED ORDER — ACETAMINOPHEN 325 MG RE SUPP: 325.0000 mg | RECTAL | Status: DC | PRN

## 2023-01-11 MED ORDER — KETAMINE HCL 50 MG/5ML IJ SOSY
PREFILLED_SYRINGE | INTRAMUSCULAR | Status: AC
  Filled 2023-01-11: qty 5

## 2023-01-11 MED ORDER — MAGNESIUM SULFATE 2 GM/50ML IV SOLN: 2.0000 g | Freq: Once | INTRAVENOUS | Status: DC | PRN

## 2023-01-11 MED ORDER — EPHEDRINE 5 MG/ML INJ
INTRAVENOUS | Status: AC
  Filled 2023-01-11: qty 5

## 2023-01-11 MED ORDER — CHLORHEXIDINE GLUCONATE 0.12 % MT SOLN
15.0000 mL | Freq: Once | OROMUCOSAL | Status: AC
  Administered 2023-01-11: 15 mL via OROMUCOSAL
  Filled 2023-01-11: qty 15

## 2023-01-11 MED ORDER — CEFAZOLIN SODIUM-DEXTROSE 2-4 GM/100ML-% IV SOLN
2.0000 g | Freq: Three times a day (TID) | INTRAVENOUS | Status: AC
  Administered 2023-01-11 – 2023-01-12 (×2): 2 g via INTRAVENOUS
  Filled 2023-01-11 (×2): qty 100

## 2023-01-11 MED ORDER — CHLORHEXIDINE GLUCONATE CLOTH 2 % EX PADS: 6.0000 | MEDICATED_PAD | Freq: Once | CUTANEOUS | Status: DC

## 2023-01-11 MED ORDER — SODIUM CHLORIDE 0.9% FLUSH: 9.0000 mL | INTRAVENOUS | Status: DC | PRN

## 2023-01-11 MED ORDER — NALOXONE HCL 0.4 MG/ML IJ SOLN: 0.4000 mg | INTRAMUSCULAR | Status: DC | PRN

## 2023-01-11 MED ORDER — PANTOPRAZOLE SODIUM 40 MG PO TBEC
40.0000 mg | DELAYED_RELEASE_TABLET | Freq: Every day | ORAL | Status: DC
  Administered 2023-01-14 – 2023-01-15 (×2): 40 mg via ORAL
  Filled 2023-01-11 (×2): qty 1

## 2023-01-11 MED ORDER — DEXAMETHASONE SODIUM PHOSPHATE 10 MG/ML IJ SOLN
INTRAMUSCULAR | Status: DC | PRN
  Administered 2023-01-11: 10 mg via INTRAVENOUS

## 2023-01-11 MED ORDER — DEXAMETHASONE SODIUM PHOSPHATE 10 MG/ML IJ SOLN
INTRAMUSCULAR | Status: AC
  Filled 2023-01-11: qty 1

## 2023-01-11 MED ORDER — ALBUTEROL SULFATE (2.5 MG/3ML) 0.083% IN NEBU: 2.5000 mg | INHALATION_SOLUTION | Freq: Four times a day (QID) | RESPIRATORY_TRACT | Status: DC | PRN

## 2023-01-11 MED ORDER — ROCURONIUM BROMIDE 10 MG/ML (PF) SYRINGE
PREFILLED_SYRINGE | INTRAVENOUS | Status: AC
  Filled 2023-01-11: qty 10

## 2023-01-11 MED ORDER — EPHEDRINE SULFATE-NACL 50-0.9 MG/10ML-% IV SOSY
PREFILLED_SYRINGE | INTRAVENOUS | Status: DC | PRN
  Administered 2023-01-11: 5 mg via INTRAVENOUS

## 2023-01-11 MED ORDER — FENTANYL CITRATE (PF) 250 MCG/5ML IJ SOLN
INTRAMUSCULAR | Status: DC | PRN
  Administered 2023-01-11 (×7): 50 ug via INTRAVENOUS

## 2023-01-11 MED ORDER — FENTANYL CITRATE (PF) 250 MCG/5ML IJ SOLN
INTRAMUSCULAR | Status: AC
  Filled 2023-01-11: qty 5

## 2023-01-11 MED ORDER — PHENYLEPHRINE 80 MCG/ML (10ML) SYRINGE FOR IV PUSH (FOR BLOOD PRESSURE SUPPORT)
PREFILLED_SYRINGE | INTRAVENOUS | Status: AC
  Filled 2023-01-11: qty 10

## 2023-01-11 MED ORDER — LACTATED RINGERS IV SOLN: INTRAVENOUS | Status: DC | PRN

## 2023-01-11 MED ORDER — SODIUM CHLORIDE (PF) 0.9 % IJ SOLN
INTRAMUSCULAR | Status: AC
  Filled 2023-01-11: qty 20

## 2023-01-11 MED ORDER — MORPHINE SULFATE 1 MG/ML IV SOLN PCA
INTRAVENOUS | Status: DC
  Administered 2023-01-11: 9 mg via INTRAVENOUS
  Administered 2023-01-12: 3 mg via INTRAVENOUS
  Filled 2023-01-11: qty 30

## 2023-01-11 MED ORDER — CEFAZOLIN SODIUM 1 G IJ SOLR
INTRAMUSCULAR | Status: AC
  Filled 2023-01-11: qty 20

## 2023-01-11 MED ORDER — HYDRALAZINE HCL 20 MG/ML IJ SOLN: 5.0000 mg | INTRAMUSCULAR | Status: DC | PRN

## 2023-01-11 MED ORDER — KETAMINE HCL 10 MG/ML IJ SOLN
INTRAMUSCULAR | Status: DC | PRN
  Administered 2023-01-11 (×2): 10 mg via INTRAVENOUS
  Administered 2023-01-11: 30 mg via INTRAVENOUS

## 2023-01-11 MED ORDER — PROTAMINE SULFATE 10 MG/ML IV SOLN
INTRAVENOUS | Status: DC | PRN
  Administered 2023-01-11: 50 mg via INTRAVENOUS

## 2023-01-11 MED ORDER — PHENYLEPHRINE HCL-NACL 20-0.9 MG/250ML-% IV SOLN
INTRAVENOUS | Status: DC | PRN
  Administered 2023-01-11: 25 ug/min via INTRAVENOUS

## 2023-01-11 MED ORDER — DIPHENHYDRAMINE HCL 50 MG/ML IJ SOLN: 12.5000 mg | Freq: Four times a day (QID) | INTRAMUSCULAR | Status: DC | PRN

## 2023-01-11 MED ORDER — FLUTICASONE FUROATE-VILANTEROL 200-25 MCG/ACT IN AEPB
1.0000 | INHALATION_SPRAY | Freq: Every day | RESPIRATORY_TRACT | Status: DC
  Administered 2023-01-12 – 2023-01-15 (×3): 1 via RESPIRATORY_TRACT
  Filled 2023-01-11: qty 28

## 2023-01-11 MED ORDER — PROTAMINE SULFATE 10 MG/ML IV SOLN
INTRAVENOUS | Status: AC
  Filled 2023-01-11: qty 5

## 2023-01-11 MED ORDER — LABETALOL HCL 5 MG/ML IV SOLN
10.0000 mg | INTRAVENOUS | Status: DC | PRN
  Administered 2023-01-14 – 2023-01-15 (×2): 10 mg via INTRAVENOUS
  Filled 2023-01-11 (×2): qty 4

## 2023-01-11 MED ORDER — HEPARIN 6000 UNIT IRRIGATION SOLUTION
Status: DC | PRN
  Administered 2023-01-11: 1

## 2023-01-11 MED ORDER — POTASSIUM CHLORIDE CRYS ER 20 MEQ PO TBCR: 20.0000 meq | EXTENDED_RELEASE_TABLET | Freq: Once | ORAL | Status: DC | PRN

## 2023-01-11 MED ORDER — PROMETHAZINE HCL 25 MG/ML IJ SOLN: 6.2500 mg | INTRAMUSCULAR | Status: DC | PRN

## 2023-01-11 MED ORDER — MIDAZOLAM HCL 2 MG/2ML IJ SOLN
INTRAMUSCULAR | Status: DC | PRN
  Administered 2023-01-11 (×2): 1 mg via INTRAVENOUS

## 2023-01-11 MED ORDER — ONDANSETRON HCL 4 MG/2ML IJ SOLN
INTRAMUSCULAR | Status: DC | PRN
  Administered 2023-01-11: 4 mg via INTRAVENOUS

## 2023-01-11 MED ORDER — ROCURONIUM BROMIDE 10 MG/ML (PF) SYRINGE
PREFILLED_SYRINGE | INTRAVENOUS | Status: DC | PRN
  Administered 2023-01-11: 50 mg via INTRAVENOUS
  Administered 2023-01-11: 30 mg via INTRAVENOUS
  Administered 2023-01-11: 20 mg via INTRAVENOUS
  Administered 2023-01-11: 40 mg via INTRAVENOUS
  Administered 2023-01-11: 60 mg via INTRAVENOUS

## 2023-01-11 MED ORDER — SUGAMMADEX SODIUM 200 MG/2ML IV SOLN
INTRAVENOUS | Status: DC | PRN
  Administered 2023-01-11: 200 mg via INTRAVENOUS

## 2023-01-11 MED ORDER — DIPHENHYDRAMINE HCL 12.5 MG/5ML PO ELIX: 12.5000 mg | ORAL_SOLUTION | Freq: Four times a day (QID) | ORAL | Status: DC | PRN

## 2023-01-11 MED ORDER — CHLORHEXIDINE GLUCONATE CLOTH 2 % EX PADS
6.0000 | MEDICATED_PAD | Freq: Every day | CUTANEOUS | Status: DC
  Administered 2023-01-11 – 2023-01-15 (×5): 6 via TOPICAL

## 2023-01-11 MED ORDER — PANTOPRAZOLE SODIUM 40 MG IV SOLR
40.0000 mg | Freq: Every day | INTRAVENOUS | Status: AC
  Administered 2023-01-11 – 2023-01-13 (×3): 40 mg via INTRAVENOUS
  Filled 2023-01-11 (×3): qty 10

## 2023-01-11 MED ORDER — DOCUSATE SODIUM 100 MG PO CAPS
100.0000 mg | ORAL_CAPSULE | Freq: Every day | ORAL | Status: DC
  Administered 2023-01-14 – 2023-01-15 (×2): 100 mg via ORAL
  Filled 2023-01-11 (×3): qty 1

## 2023-01-11 MED ORDER — SODIUM CHLORIDE 0.9 % IV SOLN: INTRAVENOUS | Status: DC

## 2023-01-11 MED ORDER — ACETAMINOPHEN 500 MG PO TABS
1000.0000 mg | ORAL_TABLET | Freq: Once | ORAL | Status: AC
  Administered 2023-01-11: 1000 mg via ORAL
  Filled 2023-01-11: qty 2

## 2023-01-11 MED ORDER — HEPARIN SODIUM (PORCINE) 1000 UNIT/ML IJ SOLN
INTRAMUSCULAR | Status: AC
  Filled 2023-01-11: qty 10

## 2023-01-11 MED ORDER — FENTANYL CITRATE (PF) 100 MCG/2ML IJ SOLN: 25.0000 ug | INTRAMUSCULAR | Status: DC | PRN

## 2023-01-11 MED ORDER — MIDAZOLAM HCL 2 MG/2ML IJ SOLN
INTRAMUSCULAR | Status: AC
  Filled 2023-01-11: qty 2

## 2023-01-11 MED ORDER — SODIUM CHLORIDE 0.9 % IV SOLN: INTRAVENOUS | Status: DC | PRN

## 2023-01-11 MED ORDER — PHENOL 1.4 % MT LIQD
1.0000 | OROMUCOSAL | Status: DC | PRN
  Administered 2023-01-12: 1 via OROMUCOSAL
  Filled 2023-01-11: qty 177

## 2023-01-11 MED ORDER — SURGIFLO WITH THROMBIN (HEMOSTATIC MATRIX KIT) OPTIME
TOPICAL | Status: DC | PRN
  Administered 2023-01-11: 2 via TOPICAL

## 2023-01-11 MED ORDER — HEPARIN SODIUM (PORCINE) 5000 UNIT/ML IJ SOLN
5000.0000 [IU] | Freq: Three times a day (TID) | INTRAMUSCULAR | Status: DC
  Administered 2023-01-11 – 2023-01-13 (×5): 5000 [IU] via SUBCUTANEOUS
  Filled 2023-01-11 (×5): qty 1

## 2023-01-11 MED ORDER — ORAL CARE MOUTH RINSE: 15.0000 mL | Freq: Once | OROMUCOSAL | Status: AC

## 2023-01-11 MED ORDER — HEPARIN 6000 UNIT IRRIGATION SOLUTION
Status: AC
  Filled 2023-01-11: qty 500

## 2023-01-11 MED ORDER — PROPOFOL 10 MG/ML IV BOLUS
INTRAVENOUS | Status: AC
  Filled 2023-01-11: qty 20

## 2023-01-11 MED ORDER — BISACODYL 10 MG RE SUPP: 10.0000 mg | Freq: Every day | RECTAL | Status: DC | PRN

## 2023-01-11 MED ORDER — PHENYLEPHRINE 80 MCG/ML (10ML) SYRINGE FOR IV PUSH (FOR BLOOD PRESSURE SUPPORT)
PREFILLED_SYRINGE | INTRAVENOUS | Status: DC | PRN
  Administered 2023-01-11: 80 ug via INTRAVENOUS
  Administered 2023-01-11 (×2): 160 ug via INTRAVENOUS
  Administered 2023-01-11: 40 ug via INTRAVENOUS
  Administered 2023-01-11 (×2): 80 ug via INTRAVENOUS
  Administered 2023-01-11: 120 ug via INTRAVENOUS
  Administered 2023-01-11 (×2): 80 ug via INTRAVENOUS

## 2023-01-11 MED ORDER — ACETAMINOPHEN 325 MG PO TABS
325.0000 mg | ORAL_TABLET | ORAL | Status: DC | PRN
  Administered 2023-01-14 – 2023-01-15 (×2): 650 mg via ORAL
  Filled 2023-01-11 (×2): qty 2

## 2023-01-11 MED ORDER — HEPARIN SODIUM (PORCINE) 1000 UNIT/ML IJ SOLN
INTRAMUSCULAR | Status: DC | PRN
  Administered 2023-01-11 (×3): 2000 [IU] via INTRAVENOUS
  Administered 2023-01-11: 7000 [IU] via INTRAVENOUS

## 2023-01-11 MED ORDER — PROPOFOL 10 MG/ML IV BOLUS
INTRAVENOUS | Status: DC | PRN
  Administered 2023-01-11: 140 mg via INTRAVENOUS
  Administered 2023-01-11: 40 mg via INTRAVENOUS

## 2023-01-11 MED ORDER — SODIUM CHLORIDE 0.9% IV SOLUTION: Freq: Once | INTRAVENOUS | Status: AC

## 2023-01-11 MED ORDER — 0.9 % SODIUM CHLORIDE (POUR BTL) OPTIME
TOPICAL | Status: DC | PRN
  Administered 2023-01-11: 1000 mL

## 2023-01-11 MED ORDER — SODIUM CHLORIDE 0.9 % IV SOLN: 500.0000 mL | Freq: Once | INTRAVENOUS | Status: DC | PRN

## 2023-01-11 MED ORDER — LACTATED RINGERS IV SOLN: INTRAVENOUS | Status: DC

## 2023-01-11 MED ORDER — MANNITOL 25 % IV SOLN
INTRAVENOUS | Status: DC | PRN
  Administered 2023-01-11: 25 g via INTRAVENOUS

## 2023-01-11 MED ORDER — ONDANSETRON HCL 4 MG/2ML IJ SOLN
4.0000 mg | Freq: Four times a day (QID) | INTRAMUSCULAR | Status: DC | PRN
  Administered 2023-01-12: 4 mg via INTRAVENOUS
  Filled 2023-01-11: qty 2

## 2023-01-11 MED ORDER — METOPROLOL TARTRATE 5 MG/5ML IV SOLN: 2.0000 mg | INTRAVENOUS | Status: DC | PRN

## 2023-01-11 MED ORDER — ALBUMIN HUMAN 5 % IV SOLN: INTRAVENOUS | Status: DC | PRN

## 2023-01-11 MED ORDER — LIDOCAINE 2% (20 MG/ML) 5 ML SYRINGE
INTRAMUSCULAR | Status: AC
  Filled 2023-01-11: qty 5

## 2023-01-11 MED ORDER — LIDOCAINE 2% (20 MG/ML) 5 ML SYRINGE
INTRAMUSCULAR | Status: DC | PRN
  Administered 2023-01-11: 80 mg via INTRAVENOUS

## 2023-01-11 MED ORDER — GUAIFENESIN-DM 100-10 MG/5ML PO SYRP: 15.0000 mL | ORAL_SOLUTION | ORAL | Status: DC | PRN

## 2023-01-11 MED ORDER — CEFAZOLIN SODIUM-DEXTROSE 2-4 GM/100ML-% IV SOLN
2.0000 g | INTRAVENOUS | Status: AC
  Administered 2023-01-11 (×2): 2 g via INTRAVENOUS
  Filled 2023-01-11: qty 100

## 2023-01-11 MED ORDER — METOPROLOL TARTRATE 5 MG/5ML IV SOLN
2.5000 mg | Freq: Four times a day (QID) | INTRAVENOUS | Status: DC
  Administered 2023-01-11 – 2023-01-14 (×11): 2.5 mg via INTRAVENOUS
  Filled 2023-01-11 (×11): qty 5

## 2023-01-11 MED ORDER — ONDANSETRON HCL 4 MG/2ML IJ SOLN
INTRAMUSCULAR | Status: AC
  Filled 2023-01-11: qty 2

## 2023-01-11 SURGICAL SUPPLY — 48 items
ADH SKN CLS APL DERMABOND .7 (GAUZE/BANDAGES/DRESSINGS) ×2
AGENT HMST KT MTR STRL THRMB (HEMOSTASIS) ×4
BAG COUNTER SPONGE SURGICOUNT (BAG) ×2 IMPLANT
BAG SPNG CNTER NS LX DISP (BAG) ×2
CANISTER SUCT 3000ML PPV (MISCELLANEOUS) ×2 IMPLANT
CLIP VESOCCLUDE MED 24/CT (CLIP) ×2 IMPLANT
CLIP VESOCCLUDE SM WIDE 24/CT (CLIP) ×2 IMPLANT
DERMABOND ADVANCED .7 DNX12 (GAUZE/BANDAGES/DRESSINGS) ×4 IMPLANT
ELECT BLADE 4.0 EZ CLEAN MEGAD (MISCELLANEOUS)
ELECT BLADE 6.5 EXT (BLADE) IMPLANT
ELECT REM PT RETURN 9FT ADLT (ELECTROSURGICAL) ×4
ELECTRODE BLDE 4.0 EZ CLN MEGD (MISCELLANEOUS) ×2 IMPLANT
ELECTRODE REM PT RTRN 9FT ADLT (ELECTROSURGICAL) ×2 IMPLANT
FELT TEFLON 1X6 (MISCELLANEOUS) IMPLANT
GLOVE SURG SS PI 7.5 STRL IVOR (GLOVE) ×6 IMPLANT
GOWN STRL REUS W/ TWL LRG LVL3 (GOWN DISPOSABLE) ×4 IMPLANT
GOWN STRL REUS W/ TWL XL LVL3 (GOWN DISPOSABLE) ×2 IMPLANT
GOWN STRL REUS W/TWL LRG LVL3 (GOWN DISPOSABLE) ×4
GOWN STRL REUS W/TWL XL LVL3 (GOWN DISPOSABLE) ×2
GRAFT HEMASHIELD 22X11M (Vascular Products) IMPLANT
GRAFT HEMASHIELD 24X30M (Vascular Products) IMPLANT
HEMOSTAT SNOW SURGICEL 2X4 (HEMOSTASIS) IMPLANT
INSERT FOGARTY 61MM (MISCELLANEOUS) ×4 IMPLANT
INSERT FOGARTY SM (MISCELLANEOUS) ×8 IMPLANT
KIT BASIN OR (CUSTOM PROCEDURE TRAY) ×2 IMPLANT
KIT TURNOVER KIT B (KITS) ×2 IMPLANT
NS IRRIG 1000ML POUR BTL (IV SOLUTION) ×4 IMPLANT
PACK AORTA (CUSTOM PROCEDURE TRAY) ×2 IMPLANT
PAD ARMBOARD 7.5X6 YLW CONV (MISCELLANEOUS) ×4 IMPLANT
SURGIFLO W/THROMBIN 8M KIT (HEMOSTASIS) IMPLANT
SUT ETHIBOND 5 LR DA (SUTURE) IMPLANT
SUT PDS AB 1 TP1 54 (SUTURE) ×4 IMPLANT
SUT PROLENE 3 0 SH 48 (SUTURE) ×6 IMPLANT
SUT PROLENE 4 0 RB 1 (SUTURE) ×10
SUT PROLENE 4-0 RB1 .5 CRCL 36 (SUTURE) IMPLANT
SUT PROLENE 5 0 C 1 24 (SUTURE) IMPLANT
SUT PROLENE 5 0 C 1 36 (SUTURE) IMPLANT
SUT PROLENE 6 0 BV (SUTURE) IMPLANT
SUT SILK 2 0SH CR/8 30 (SUTURE) ×2 IMPLANT
SUT VIC AB 2-0 CT1 27 (SUTURE) ×4
SUT VIC AB 2-0 CT1 TAPERPNT 27 (SUTURE) ×4 IMPLANT
SUT VIC AB 3-0 SH 27 (SUTURE) ×4
SUT VIC AB 3-0 SH 27X BRD (SUTURE) IMPLANT
SUT VICRYL 4-0 PS2 18IN ABS (SUTURE) ×4 IMPLANT
TOWEL GREEN STERILE (TOWEL DISPOSABLE) ×2 IMPLANT
TOWEL ~~LOC~~+RFID 17X26 BLUE (SPONGE) ×4 IMPLANT
TRAY FOLEY MTR SLVR 16FR STAT (SET/KITS/TRAYS/PACK) ×2 IMPLANT
WATER STERILE IRR 1000ML POUR (IV SOLUTION) ×4 IMPLANT

## 2023-01-11 NOTE — Op Note (Signed)
Patient name: Ashley Robertson MRN: [id number] DOB: Mar 15, 1956 Sex: female  01/11/2023 Pre-operative Diagnosis: 5.7 juxtarenal abdominal aortic aneurysm Post-operative diagnosis:  Same Surgeon:  [name] Assistants:  Fortunato Curling, MD, Leontine Locket, PA Procedure:   #1: Open repair of juxtarenal abdominal aortic aneurysm using a 22 x 11 bifurcated dacryon graft   #2: Lysis of adhesions (30 minutes) Anesthesia:  General Blood Loss: See anesthesia record Specimens:  none  Findings: End to end aortic anastomosis below the renal arteries.  I was able to get a clamp below both renal arteries.  The iliac anastomoses were to the common iliac artery and a end to end fashion.  After the repair, I felt that there was a kink in the right common iliac anastomosis and so I elected to resect a portion of the tube portion of the graft to alleviate the kink.  She had a retroaortic left renal vein.  I ligated the adrenal vein.  The gonadal vein was left intact  Indications: This is a 67 year old female with progressively enlarging 5.7 cm juxtarenal abdominal aortic aneurysm.  Her infrarenal neck was too short for standard stent graft repair and her anatomy was not conducive to a fenestrated repair and therefore I felt open surgery was her best option.  Risks and benefits were discussed and she wished to proceed.  Procedure:  The patient was identified in the holding area and taken to [place] 11  The patient was then placed supine on the table. general anesthesia was administered.  The patient was prepped and draped in the usual sterile fashion.  A time out was called and antibiotics were administered.  Due to the complexity of the operation, a assistant was necessary.  Assistance help with exposure, suction and retraction as well as suture following for the anastomoses.  A midline incision was made from the xiphoid down below the umbilicus.  Cautery was used divide subcutaneous tissue down to the  abdominal wall fascia which was opened with cautery.  The peritoneum was then opened sharply with Metzenbaum scissors and then extended throughout the length of the incision.  The abdomen was inspected.  There was no gross pathology.  I did have to deal with adhesions in the pelvis which were taken down with cautery.  The falciform ligament was divided between silk ties.  The NG tube was confirmed to be in the stomach.  Next a Balfour retractor was placed followed by the Omni-Tract.  The transverse colon was reflected cephalad and the small bowel was mobilized outside of the body to the patient's right.  The ligament of Treitz was then taken down with cautery.  The inferior mesenteric vein was ligated between silk ties next, the retroperitoneum overlying the aneurysm sac was opened.  I then dissected out the infrarenal neck up to the left renal artery which was the lowest renal artery.  This was encircled with a vessel loop.  I then proceeded to mobilize the aneurysm.  The inferior mesenteric artery was encircled with a red vessel loop.  I then dissected out bilateral common iliac arteries which were calcified posteriorly.  Attention was then turned towards dissecting out the neck of the aneurysm.  I was careful not to injure the retroaortic left renal vein.  I did have to ligate the adrenal vein between silk ties for good exposure.  Ultimately a plane was developed posterior to the aneurysm so that an umbilical tape could be passed.  At this point the patient was  fully heparinized.  Heparin levels were checked periodically and redosed appropriately.  She did receive 25 mg of mannitol.  I then occluded the iliac arteries with Henley clamps.  A Harken clamp was placed below the left renal artery.  A #11 blade was used to open the aneurysm sac which was extended with curved Mayo scissors.  I transected the aorta proximally.  Several lumbar arteries were divided.  The inferior mesenteric artery was occluded at its  origin.  I felt there was an adequate infrarenal neck to sew to.  I did perform a limited endarterectomy of the infrarenal aorta neck.  A 22 x 11 bifurcated dacryon graft was selected.  The tube portion was cut appropriately to match the native bifurcation.  I then completed a end to end anastomosis with running 3-0 Prolene incorporating a felt strip.  Once this was completed the proximal clamp was released.  I then placed a 24 graft over top of the anastomosis to reinforce it.  I then placed a clamp on the graft and cut the iliac limbs to the appropriate length.  I transected the common iliac arteries bilaterally.  I first performed the anastomosis to the right common iliac artery.  This was done and a end to end fashion with running 4-0 Prolene.  After flushing, blood flow was reestablished to the right leg.  Next attention was turned towards the left common iliac artery.  I initially tried to perform the anastomosis however because of the plaque I had to dissect out more distally in the common iliac artery.  In doing so a pelvic vein was injured which had to be suture-ligated.  I was able to get back to relatively healthy iliac artery tissue that would hold the suture.  The left limb of the graft was then cut to the appropriate length and a end to end anastomosis was completed using a running 4-0 Prolene.  Prior to completion, the appropriate flushing maneuvers were performed and blood flow was reestablished to the left leg.  After further inspection of the graft, I felt that the tube portion was a little on the long side which created a kink in the right limb.  I felt that it would be best to address this.  And so I transected the tube portion of the graft and resected approximately 1-2 cm of the graft and then performed a end to end anastomosis between the 2 ends of the tube portion of the graft.  Once blood flow was reestablished, I was very satisfied with the results.  There is no longer a kink in the graft.   The patient had palpable femoral pulses and brisk pedal Doppler signals.  Patient's heparin was then reversed with 50 mg of protamine.  Hemostasis was achieved.  The aneurysm sac was closed over the graft with running 2-0 Vicryl.  I then reapproximated the retroperitoneum using a running 2-0 Vicryl.  Next the small bowel was placed back into its anatomic position.  I ran the small bowel and there were no defects.  The colon and omentum were placed back into their anatomic position.  The fascia was closed with 2 running #1 PDS suture.  The subcutaneous tissue was closed with running 2-0 Vicryl.  The skin was closed with 4-0 Vicryl followed by Dermabond.  The patient was successfully extubated and taken to recovery in stable condition.  There were no immediate complications.   Disposition:   To PACU stable.  Theotis Burrow, M.D., [facility] [place] Office: [phone] Pager:  [phone]

## 2023-01-11 NOTE — Anesthesia Procedure Notes (Signed)
Procedure Name: Intubation Date/Time: 01/11/2023 7:44 AM  Performed by: Harden Mo, CRNAPre-anesthesia Checklist: Patient identified, Emergency Drugs available, Suction available and Patient being monitored Patient Re-evaluated:Patient Re-evaluated prior to induction Oxygen Delivery Method: Circle System Utilized Preoxygenation: Pre-oxygenation with 100% oxygen Induction Type: IV induction Ventilation: Mask ventilation without difficulty Laryngoscope Size: Miller and 2 Grade View: Grade I Tube type: Oral Tube size: 7.5 mm Number of attempts: 1 Airway Equipment and Method: Stylet and Oral airway Placement Confirmation: ETT inserted through vocal cords under direct vision, positive ETCO2 and breath sounds checked- equal and bilateral Secured at: 23 cm Tube secured with: Tape Dental Injury: Teeth and Oropharynx as per pre-operative assessment

## 2023-01-11 NOTE — Progress Notes (Signed)
  Day of Surgery Note    Subjective:  resting in recovery.  No complaints   Vitals:   01/11/23 1415 01/11/23 1430  BP: 121/60 122/64  Pulse: 66 65  Resp: 10 11  Temp:  98 F (36.7 C)  SpO2: 96% 94%    Incisions:   laparotomy incision is clean and dry Extremities:  easily palpable DP/PT pulses bilaterally Cardiac:  regular Lungs:  non labored Abdomen:  soft   Assessment/Plan:  This is a 67 y.o. female who is s/p  Aortobiliac bypass grafting for AAA  -pt doing well in recovery -she has palpable pedal pulses bilaterally IVF normal saline at 100cc/hr -PCA for pain -inhalers reordered -continue po until bowel function returns. -to [facility] later this afternoon   Leontine Locket, PA-C 01/11/2023 2:46 PM [phone]

## 2023-01-11 NOTE — Discharge Instructions (Signed)
 [facility] [place]  Discharge Instructions   Open Aortic Surgery  Please refer to the following instructions for your post-procedure care. Your surgeon or Physician Assistant will discuss any changes with you.  Activity  Avoid lifting more than eight pounds (a gallon of milk) until after your first post-operative visit. You are encouraged to walk as much as you can. You can slowly return to normal activities but must avoid strenuous activity and heavy lifting until your doctor tells you it's okay. Heavy lifting can hurt the incision and cause a hernia. Avoid activities such as vacuuming or swinging a golf club. It is normal to feel tired for several weeks after your surgery. Do not drive until your doctor gives the okay and you are no longer taking prescription pain medications. It is also normal to have difficulty with sleep habits, eating and bowl movements after surgery. These will go away with time.  Bathing/Showering  Shower daily after you go home. Do not soak in a bathtub, hot tub, or swim until the incision heals.  Incision Care  Shower every day. Clean your incision with mild soap and water. Pat the area dry with a clean towel. You do not need a bandage unless otherwise instructed. Do not apply any ointments or creams to your incision. You may have skin glue on your incision. Do not peel it off. It will come off on its own in about one week. If you have staples or sutures along your incision, they will be removed at your post op appointment.  If you have groin incisions, wash the groin wounds with soap and water daily and pat dry. (No tub bath-only shower)  Then put a dry gauze or washcloth in the groin to keep this area dry to help prevent wound infection.  Do this daily and as needed.  Do not use Vaseline or neosporin on your incisions.  Only use soap and water on your incisions and then protect and keep dry.  Diet  Resume your normal diet. There are no  special food restriction following this procedure. A low fat/low cholesterol diet is recommended for all patients with vascular disease. After your aortic surgery, it's normal to feel full faster than usual and to not feel as hungry as you normally would. You will probably lose weight initially following your surgery. It's best to eat small, frequent meals over the course of the day. Call the office if you find that you are unable to eat even small meals.   In order to heal from your surgery, it is CRITICAL to get adequate nutrition. Your body requires vitamins, minerals, and protein. Vegetables are the best source of vitamins and minerals. If you have pain, you may take over-the-counter pain reliever such as acetaminophen (Tylenol). If you were prescribed a stronger pain medication, please be aware these medication can cause nausea and constipation. Prevent nausea by taking the medication with a snack or meal. Avoid constipation by drinking plenty of fluids and eating foods with a high amount of fiber, such as fruits, vegetables and grains. Take 100mg of the over-the-counter stool softener Colace twice a day as needed to help with constipation. A laxative, such as Milk of Magnesia, may be recommended for you at this time. Do not take a laxative unless your surgeon or P.A. tells you it's OK.  Do not take Tylenol if you are taking stronger pain medications (such as Percocet).  Follow Up  Our office will schedule a follow up   appointment 2-3 weeks after discharge.  Please call us immediately for any of the following conditions    .     Severe or worsening pain in your legs or feet or in your abdomen back or chest. Increased pain, redness drainage (pus) from your incision site. Increased abdominal pain, bloating, nausea, vomiting, or persistent diarrhea. Fever of 101 degrees or higher. Swelling in your leg (s).  Reduce your risk of vascular disease  Stop smoking. If you would like help, call  [facility] at 1-800-QUIT-NOW 657-440-4276) or [place] at (432)091-3792. Manage your cholesterol Maintain a desired weight Control your diabetes Keep your blood pressure down  If you have any questions please call the office at [phone].  --------------------------------------------------- Information on my medicine - ELIQUIS (apixaban)  Why was Eliquis prescribed for you? Eliquis was prescribed for you to reduce the risk of a blood clot forming that can cause a stroke if you have a medical condition called atrial fibrillation (a type of irregular heartbeat).  What do You need to know about Eliquis ? Take your Eliquis TWICE DAILY - one tablet in the morning and one tablet in the evening with or without food. If you have difficulty swallowing the tablet whole please discuss with your pharmacist how to take the medication safely.  Take Eliquis exactly as prescribed by your doctor and DO NOT stop taking Eliquis without talking to the doctor who prescribed the medication.  Stopping may increase your risk of developing a stroke.  Refill your prescription before you run out.  After discharge, you should have regular check-up appointments with your healthcare provider that is prescribing your Eliquis.  In the future your dose may need to be changed if your kidney function or weight changes by a significant amount or as you get older.  What do you do if you miss a dose? If you miss a dose, take it as soon as you remember on the same day and resume taking twice daily.  Do not take more than one dose of ELIQUIS at the same time to make up a missed dose.  Important Safety Information A possible side effect of Eliquis is bleeding. You should call your healthcare provider right away if you experience any of the following: Bleeding from an injury or your nose that does not stop. Unusual colored urine (red or dark brown) or unusual colored stools (red or black). Unusual bruising for  unknown reasons. A serious fall or if you hit your head (even if there is no bleeding).  Some medicines may interact with Eliquis and might increase your risk of bleeding or clotting while on Eliquis. To help avoid this, consult your healthcare provider or pharmacist prior to using any new prescription or non-prescription medications, including herbals, vitamins, non-steroidal anti-inflammatory drugs (NSAIDs) and supplements.  This website has more information on Eliquis (apixaban): http://www.eliquis.com/eliquis/home

## 2023-01-11 NOTE — Anesthesia Procedure Notes (Signed)
Central Venous Catheter Insertion Performed by: Santa Lighter, MD, anesthesiologist Start/End2/08/2023 6:50 AM, 01/11/2023 7:00 AM Patient location: Pre-op. Preanesthetic checklist: patient identified, IV checked, site marked, risks and benefits discussed, surgical consent, monitors and equipment checked, pre-op evaluation, timeout performed and anesthesia consent Position: Trendelenburg Lidocaine 1% used for infiltration and patient sedated Hand hygiene performed , maximum sterile barriers used  and Seldinger technique used Catheter size: 8.5 Fr Central line was placed.Sheath introducer Procedure performed using ultrasound guided technique. Ultrasound Notes:anatomy identified, needle tip was noted to be adjacent to the nerve/plexus identified, no ultrasound evidence of intravascular and/or intraneural injection and image(s) printed for medical record Attempts: 1 Following insertion, line sutured, dressing applied and Biopatch. Post procedure assessment: free fluid flow, blood return through all ports and no air  Patient tolerated the procedure well with no immediate complications.

## 2023-01-11 NOTE — Transfer of Care (Signed)
Immediate Anesthesia Transfer of Care Note  Patient: Ashley Robertson  Procedure(s) Performed: ANEURYSM ABDOMINAL AORTIC REPAIR APPLICATION OF CELL SAVER (Abdomen)  Patient Location: PACU  Anesthesia Type:General  Level of Consciousness: awake, alert , and oriented  Airway & Oxygen Therapy: Patient Spontanous Breathing and Patient connected to nasal cannula oxygen  Post-op Assessment: Report given to RN, Post -op Vital signs reviewed and stable, and Patient moving all extremities X 4  Post vital signs: Reviewed and stable  Last Vitals:  Vitals Value Taken Time  BP 125/67 01/11/23 1334  Temp    Pulse 70 01/11/23 1336  Resp 18 01/11/23 1336  SpO2 91 % 01/11/23 1336  Vitals shown include unvalidated device data.  Last Pain:  Vitals:   01/11/23 0613  TempSrc:   PainSc: 0-No pain         Complications: No notable events documented.

## 2023-01-11 NOTE — Anesthesia Procedure Notes (Signed)
Arterial Line Insertion Start/End2/08/2023 7:12 AM, 01/11/2023 7:18 AM Performed by: Harden Mo, CRNA, CRNA  Patient location: Pre-op. Preanesthetic checklist: patient identified, IV checked, site marked, risks and benefits discussed, surgical consent, monitors and equipment checked, pre-op evaluation and anesthesia consent Lidocaine 1% used for infiltration Left, radial was placed Catheter size: 20 G Hand hygiene performed  and maximum sterile barriers used   Attempts: 1 Procedure performed without using ultrasound guided technique. Ultrasound Notes:anatomy identified, needle tip was noted to be adjacent to the nerve/plexus identified and no ultrasound evidence of intravascular and/or intraneural injection Following insertion, dressing applied and Biopatch. Post procedure assessment: normal and unchanged  Patient tolerated the procedure well with no immediate complications.

## 2023-01-11 NOTE — Anesthesia Postprocedure Evaluation (Signed)
Anesthesia Post Note  Patient: Ashley Robertson  Procedure(s) Performed: ANEURYSM ABDOMINAL AORTIC REPAIR APPLICATION OF CELL SAVER (Abdomen)     Patient location during evaluation: PACU Anesthesia Type: General Level of consciousness: awake and alert Pain management: pain level controlled Vital Signs Assessment: post-procedure vital signs reviewed and stable Respiratory status: spontaneous breathing, nonlabored ventilation, respiratory function stable and patient connected to nasal cannula oxygen Cardiovascular status: blood pressure returned to baseline and stable Postop Assessment: no apparent nausea or vomiting Anesthetic complications: no   No notable events documented.  Last Vitals:  Vitals:   01/11/23 1415 01/11/23 1430  BP: 121/60 122/64  Pulse: 66 65  Resp: 10 11  Temp:  36.7 C  SpO2: 96% 94%    Last Pain:  Vitals:   01/11/23 1430  TempSrc:   PainSc: [place]

## 2023-01-11 NOTE — Interval H&P Note (Signed)
History and Physical Interval Note:  01/11/2023 7:24 AM  Ashley Robertson  has presented today for surgery, with the diagnosis of Juxtarenal abdominal aortic aneurysm without rupture.  The various methods of treatment have been discussed with the patient and family. After consideration of risks, benefits and other options for treatment, the patient has consented to  Procedure(s): ANEURYSM ABDOMINAL AORTIC REPAIR (N/A) as a surgical intervention.  The patient's history has been reviewed, patient examined, no change in status, stable for surgery.  I have reviewed the patient's chart and labs.  Questions were answered to the patient's satisfaction.     [name]

## 2023-01-12 ENCOUNTER — Inpatient Hospital Stay (HOSPITAL_COMMUNITY): Payer: PPO

## 2023-01-12 LAB — CBC
HCT: 33.2 % — ABNORMAL LOW (ref 36.0–46.0)
Hemoglobin: 11.1 g/dL — ABNORMAL LOW (ref 12.0–15.0)
MCH: 32.5 pg (ref 26.0–34.0)
MCHC: 33.4 g/dL (ref 30.0–36.0)
MCV: 97.1 fL (ref 80.0–100.0)
Platelets: 95 10*3/uL — ABNORMAL LOW (ref 150–400)
RBC: 3.42 MIL/uL — ABNORMAL LOW (ref 3.87–5.11)
RDW: 15.6 % — ABNORMAL HIGH (ref 11.5–15.5)
WBC: 9.5 10*3/uL (ref 4.0–10.5)
nRBC: 0 % (ref 0.0–0.2)

## 2023-01-12 LAB — COMPREHENSIVE METABOLIC PANEL
ALT: 16 U/L (ref 0–44)
AST: 24 U/L (ref 15–41)
Albumin: 2.9 g/dL — ABNORMAL LOW (ref 3.5–5.0)
Alkaline Phosphatase: 64 U/L (ref 38–126)
Anion gap: 9 (ref 5–15)
BUN: 9 mg/dL (ref 8–23)
CO2: 24 mmol/L (ref 22–32)
Calcium: 8.2 mg/dL — ABNORMAL LOW (ref 8.9–10.3)
Chloride: 103 mmol/L (ref 98–111)
Creatinine, Ser: 0.6 mg/dL (ref 0.44–1.00)
GFR, Estimated: >60 mL/min (ref >60)
Glucose, Bld: 118 mg/dL — ABNORMAL HIGH (ref 70–99)
Potassium: 3.5 mmol/L (ref 3.5–5.1)
Sodium: 136 mmol/L (ref 135–145)
Total Bilirubin: 0.7 mg/dL (ref 0.3–1.2)
Total Protein: 5.1 g/dL — ABNORMAL LOW (ref 6.5–8.1)

## 2023-01-12 LAB — AMYLASE: Amylase: 28 U/L (ref 28–100)

## 2023-01-12 LAB — MAGNESIUM: Magnesium: 1.7 mg/dL (ref 1.7–2.4)

## 2023-01-12 NOTE — TOC Initial Note (Addendum)
Transition of Care [facility]) - Initial/Assessment Note    Patient Details  Name: Ashley Robertson MRN: [id number] Date of Birth: [birth year]  [facility]) CM/SW Contact:    Erenest Rasher, RN Phone Number: [phone] 01/12/2023, 6:07 AM  Clinical Narrative:                 Spoke to pt's husband and pt was independent PTA. HH was arranged preoperatively with [facility]. Will continue to follow dc needs.  Expected Discharge Plan: IP Rehab Facility Barriers to Discharge: Continued Medical Work up   Patient Goals and CMS Choice Patient states their goals for this hospitalization and ongoing recovery are:: wants patient to recover CMS Medicare.gov Compare Post Acute Care list provided to:: Patient Represenative (must comment) (husband-Skip) Choice offered to / list presented to : Spouse      Expected Discharge Plan and Services   Discharge Planning Services: CM Consult   Living arrangements for the past 2 months: Single Family Home                                      Prior Living Arrangements/Services Living arrangements for the past 2 months: Single Family Home Lives with:: Spouse Patient language and need for interpreter reviewed:: Yes Do you feel safe going back to the place where you live?: Yes      Need for Family Participation in Patient Care: No (Comment) Care giver support system in place?: Yes (comment)   Criminal Activity/Legal Involvement Pertinent to Current Situation/Hospitalization: No - Comment as needed  Activities of Daily Living      Permission Sought/Granted Permission sought to share information with : Case Manager, PCP, Family Supports    Share Information with NAME: Skip Kincheloe     Permission granted to share info w Relationship: husband  Permission granted to share info w Contact Information: [phone]  Emotional Assessment Appearance:: Appears stated age Attitude/Demeanor/Rapport: Lethargic Affect (typically  observed): Accepting Orientation: : Oriented to Self, Oriented to Place, Oriented to  Time, Oriented to Situation   Psych Involvement: No (comment)  Admission diagnosis:  AAA (abdominal aortic aneurysm) (HCC) [I71.40] S/P AAA repair [id number], Z86.79] Patient Active Problem List   Diagnosis Date Noted   AAA (abdominal aortic aneurysm) ([place]) 01/11/2023   S/P AAA repair 01/11/2023   Osteopenia 10/21/2022   Aortic atherosclerosis ([place]) 08/10/2022   S/P mastectomy, right 07/26/2022   Carotid atherosclerosis 03/28/2022   Coronary artery disease 03/28/2022   Pulmonary emphysema ([place]) 03/28/2022   Recurrent major depression in remission ([place]) 03/28/2022   Port-A-Cath in place 03/27/2022   Genetic testing 03/12/2022   Family history of breast cancer 02/21/2022   Family history of prostate cancer 02/21/2022   Hypercholesterolemia 02/21/2022   Malignant neoplasm of overlapping sites of right breast in female, estrogen receptor positive ([place]) 02/16/2022   Bilateral carotid bruits 04/26/2020   Essential hypertension 03/19/2020   Hyperlipidemia with target LDL less than 100 03/19/2020   Prediabetes 03/19/2020   Agatston CAC score, <100 03/15/2020   PCP:  Cari Caraway, MD Pharmacy:   [facility] [place] [address] Phone: [phone] Fax: [phone]     Social Determinants of Health (SDOH) Social History: SDOH Screenings   Food Insecurity: No Food Insecurity (02/21/2022)  Housing: Low Risk  (02/21/2022)  Transportation Needs: No Transportation Needs (02/21/2022)  Financial Resource Strain: Low  Risk  (02/21/2022)  Tobacco Use: Medium Risk (01/11/2023)   SDOH Interventions:     Readmission Risk Interventions     No data to display

## 2023-01-12 NOTE — Evaluation (Signed)
Physical Therapy Evaluation Patient Details Name: Ashley Robertson MRN: [id number] DOB: 1956/11/03 Today's Date: 01/12/2023  History of Present Illness  67 yo F s/p Open repair of juxtarenal abdominal aortic aneurysm 6/9.  PMH includes: CA, COPD, HTN.  Clinical Impression  Pt admitted with above diagnosis. At baseline, she is independent and has home support. Today, pt able to transfer and ambulate 800' without AD at supervision level.  Supervision mainly needed for line management in ICU.  Pt with good safety awareness and motivated to ambulate.  Pt does not require further skilled PT services.  Recommend ambulation with nursing or mobility team once out of ICU.       Recommendations for follow up therapy are one component of a multi-disciplinary discharge planning process, led by the attending physician.  Recommendations may be updated based on patient status, additional functional criteria and insurance authorization.  Follow Up Recommendations No PT follow up      Assistance Recommended at Discharge PRN  Patient can return home with the following  Assistance with cooking/housework    Equipment Recommendations None recommended by PT  Recommendations for Other Services       Functional Status Assessment Patient has not had a recent decline in their functional status     Precautions / Restrictions Precautions Precautions: None      Mobility  Bed Mobility Overal bed mobility: Needs Assistance Bed Mobility: Supine to Sit, Sit to Supine     Supine to sit: Supervision, HOB elevated Sit to supine: Supervision, HOB elevated        Transfers Overall transfer level: Needs assistance Equipment used: None Transfers: Sit to/from Stand Sit to Stand: Supervision           General transfer comment: supervision for lines    Ambulation/Gait Ambulation/Gait assistance: Supervision Gait Distance (Feet): 800 Feet Assistive device: None Gait Pattern/deviations: WFL(Within  Functional Limits)       General Gait Details: supervision for lines  Stairs            Wheelchair Mobility    Modified Rankin (Stroke Patients Only)       Balance   Sitting-balance support: No upper extremity supported Sitting balance-Leahy Scale: Normal     Standing balance support: No upper extremity supported Standing balance-Leahy Scale: Good                               Pertinent Vitals/Pain Pain Assessment Pain Assessment: 0-10 Pain Score: 6  Pain Location: Incisional soreness Pain Descriptors / Indicators: Tender Pain Intervention(s): Limited activity within patient's tolerance, Monitored during session, PCA encouraged    Home Living Family/patient expects to be discharged to:: Private residence Living Arrangements: Spouse/significant other Available Help at Discharge: Family;Available 24 hours/day Type of Home: House Home Access: Stairs to enter Entrance Stairs-Rails: Right Entrance Stairs-Number of Steps: 3 Alternate Level Stairs-Number of Steps: 6 to bed/bath Home Layout: Multi-level;1/2 bath on main level;Bed/bath upstairs Home Equipment: None      Prior Function Prior Level of Function : Independent/Modified Independent;Driving               ADLs Comments: Pt is retired OR Multimedia programmer Dominance   Dominant Hand: Right    Extremity/Trunk Assessment   Upper Extremity Assessment Upper Extremity Assessment: Overall WFL for tasks assessed    Lower Extremity Assessment Lower Extremity Assessment: Overall WFL for tasks assessed    Cervical / Trunk Assessment  Cervical / Trunk Assessment: Normal  Communication   Communication: No difficulties  Cognition Arousal/Alertness: Awake/alert Behavior During Therapy: WFL for tasks assessed/performed Overall Cognitive Status: Within Functional Limits for tasks assessed                                          General Comments General comments (skin  integrity, edema, etc.): VSS on RA    Exercises     Assessment/Plan    PT Assessment Patient does not need any further PT services  PT Problem List         PT Treatment Interventions      PT Goals (Current goals can be found in the Care Plan section)  Acute Rehab PT Goals Patient Stated Goal: return home PT Goal Formulation: All assessment and education complete, DC therapy    Frequency       Co-evaluation               AM-PAC PT "6 Clicks" Mobility  Outcome Measure Help needed turning from your back to your side while in a flat bed without using bedrails?: A Little (supervision for lines only) Help needed moving from lying on your back to sitting on the side of a flat bed without using bedrails?: A Little Help needed moving to and from a bed to a chair (including a wheelchair)?: A Little Help needed standing up from a chair using your arms (e.g., wheelchair or bedside chair)?: A Little Help needed to walk in hospital room?: A Little Help needed climbing 3-5 steps with a railing? : A Little 6 Click Score: 18    End of Session   Activity Tolerance: Patient tolerated treatment well Patient left: in bed;with call bell/phone within reach Nurse Communication: Mobility status PT Visit Diagnosis: Other abnormalities of gait and mobility (R26.89)    Time: [id number] PT Time Calculation (min) (ACUTE ONLY): 22 min   Charges:   PT Evaluation $PT Eval Moderate Complexity: 1 Mod          Mckinnley Smithey, PT Acute Rehab [facility] [phone]   Karlton Lemon 01/12/2023, 4:00 PM

## 2023-01-12 NOTE — Progress Notes (Signed)
    Subjective  - POD #1, status post open repair of juxtarenal abdominal aortic aneurysm  Had a good night overnight.  Not complaining of significant amount of pain.  Denies flatus.   Physical Exam:  Midline incision is intact.  Her abdomen is soft and appropriately tender.  She has palpable posterior tibial pulses       Assessment/Plan:  POD #1  CV: No acute issues ID: No active infectious issues Pulmonary: Continue incentive spirometry. GI: Will keep NG tube in 1 more day.  She has not had significant output.  She is not passing flatus.  She will remain n.p.o. Prophylaxis: Subcu heparin and Protonix -Will try to place a additional peripheral IV and remove her central line.  Her arterial line and Foley catheter will be removed. -Needs to walk today.  PT OT consult pending.  Wells Tajon Moring 01/12/2023 9:38 AM --  Vitals:   01/12/23 0750 01/12/23 0800  BP:    Pulse:    Resp:  20  Temp:  98.4 F (36.9 C)  SpO2: 98% 99%    Intake/Output Summary (Last 24 hours) at 01/12/2023 0938 Last data filed at 01/12/2023 0700 Gross per 24 hour  Intake 6234.71 ml  Output 4810 ml  Net 1424.71 ml     Laboratory CBC    Component Value Date/Time   WBC 9.5 01/12/2023 0358   HGB 11.1 (L) 01/12/2023 0358   HGB 13.7 12/06/2022 1226   HCT 33.2 (L) 01/12/2023 0358   PLT 95 (L) 01/12/2023 0358   PLT 178 12/06/2022 1226    BMET    Component Value Date/Time   NA 136 01/12/2023 0358   NA 135 07/24/2021 0853   K 3.5 01/12/2023 0358   CL 103 01/12/2023 0358   CO2 24 01/12/2023 0358   GLUCOSE 118 (H) 01/12/2023 0358   BUN 9 01/12/2023 0358   BUN 12 07/24/2021 0853   CREATININE 0.60 01/12/2023 0358   CREATININE 0.62 12/06/2022 1226   CALCIUM 8.2 (L) 01/12/2023 0358   GFRNONAA >60 01/12/2023 0358   GFRNONAA >60 12/06/2022 1226   GFRAA 107 11/03/2020 1206    COAG Lab Results  Component Value Date   INR 1.2 01/11/2023   INR 1.0 01/04/2023   No results found for:  "PTT"  Antibiotics Anti-infectives (From admission, onward)    Start     Dose/Rate Route Frequency Ordered Stop   01/11/23 2000  ceFAZolin (ANCEF) IVPB 2g/100 mL premix        2 g 200 mL/hr over 30 Minutes Intravenous Every 8 hours 01/11/23 1505 01/12/23 0418   01/11/23 0554  ceFAZolin (ANCEF) IVPB 2g/100 mL premix        2 g 200 mL/hr over 30 Minutes Intravenous 30 min pre-op 01/11/23 0554 01/11/23 1220        V. Leia Alf, M.D., [facility] [place] Office: [phone] Pager:  [phone]

## 2023-01-12 NOTE — Evaluation (Signed)
Occupational Therapy Evaluation Patient Details Name: Ashley Robertson MRN: [id number] DOB: 09-Mar-1956 Today's Date: 01/12/2023   History of Present Illness 67 yo F s/p Open repair of juxtarenal abdominal aortic aneurysm 6/9.  PMH includes: CA, COPD, HTN.   Clinical Impression   Patient admitted for the procedure above.  PTA she lives at home and is independent with all ADL, iADL and mobility.  Currently she expresses minimal pain, and is moving quite well, needing generalized supervision for line management.  Patient should progress quickly, and HH OT could be considered if the patient is interested.  OT will follow in the acute setting to address deficits listed, and assist with eventual transition home.        Recommendations for follow up therapy are one component of a multi-disciplinary discharge planning process, led by the attending physician.  Recommendations may be updated based on patient status, additional functional criteria and insurance authorization.   Follow Up Recommendations  Home health OT     Assistance Recommended at Discharge Intermittent Supervision/Assistance  Patient can return home with the following Assist for transportation;Assistance with cooking/housework    Functional Status Assessment  Patient has had a recent decline in their functional status and demonstrates the ability to make significant improvements in function in a reasonable and predictable amount of time.  Equipment Recommendations  None recommended by OT    Recommendations for Other Services       Precautions / Restrictions Precautions Precautions: Fall Precaution Comments: A line, NG Tube. Restrictions Weight Bearing Restrictions: No      Mobility Bed Mobility               General bed mobility comments: up in recliner    Transfers Overall transfer level: Needs assistance Equipment used: 1 person hand held assist Transfers: Sit to/from Stand, Bed to  chair/wheelchair/BSC Sit to Stand: Supervision     Step pivot transfers: Supervision            Balance Overall balance assessment: Mild deficits observed, not formally tested                                         ADL either performed or assessed with clinical judgement   ADL       Grooming: Wash/dry hands;Wash/dry face;Set up;Sitting               Lower Body Dressing: Supervision/safety;Sit to/from stand   Toilet Transfer: Supervision/safety;Rolling walker (2 wheels);Regular Toilet                   Vision Baseline Vision/History: 1 Wears glasses Patient Visual Report: No change from baseline       Perception     Praxis      Pertinent Vitals/Pain Pain Assessment Pain Assessment: Faces Faces Pain Scale: Hurts a little bit Pain Location: Incisional soreness Pain Descriptors / Indicators: Tender Pain Intervention(s): Monitored during session     Hand Dominance Right   Extremity/Trunk Assessment Upper Extremity Assessment Upper Extremity Assessment: Overall WFL for tasks assessed   Lower Extremity Assessment Lower Extremity Assessment: Defer to PT evaluation   Cervical / Trunk Assessment Cervical / Trunk Assessment: Normal   Communication Communication Communication: No difficulties   Cognition Arousal/Alertness: Awake/alert Behavior During Therapy: WFL for tasks assessed/performed Overall Cognitive Status: Within Functional Limits for tasks assessed  General Comments   VSS on RA    Exercises     Shoulder Instructions      Home Living Family/patient expects to be discharged to:: Private residence Living Arrangements: Spouse/significant other Available Help at Discharge: Family;Available 24 hours/day Type of Home: House Home Access: Stairs to enter [employer] of Steps: 3 Entrance Stairs-Rails: Right Home Layout: Multi-level;1/2 bath on main  level;Bed/bath upstairs Alternate Level Stairs-Number of Steps: 6 to bed/bath   Bathroom Shower/Tub: Teacher, early years/pre: Standard Bathroom Accessibility: Yes How Accessible: Accessible via walker Home Equipment: None          Prior Functioning/Environment Prior Level of Function : Independent/Modified Independent;Driving                        OT Problem List: Impaired balance (sitting and/or standing);Pain      OT Treatment/Interventions: Self-care/ADL training;Therapeutic activities;Patient/family education;Balance training    OT Goals(Current goals can be found in the care plan section) Acute Rehab OT Goals Patient Stated Goal: Return home OT Goal Formulation: With patient Time For Goal Achievement: 01/25/23 Potential to Achieve Goals: Good ADL Goals Pt Will Perform Grooming: Independently;standing Pt Will Perform Lower Body Dressing: Independently;sit to/from stand Pt Will Transfer to Toilet: Independently;regular height toilet;ambulating  OT Frequency: Min 2X/week    Co-evaluation              AM-PAC OT "6 Clicks" Daily Activity     Outcome Measure Help from another person eating meals?: None Help from another person taking care of personal grooming?: None Help from another person toileting, which includes using toliet, bedpan, or urinal?: A Little Help from another person bathing (including washing, rinsing, drying)?: A Little Help from another person to put on and taking off regular upper body clothing?: A Little Help from another person to put on and taking off regular lower body clothing?: A Little 6 Click Score: 20   End of Session Equipment Utilized During Treatment: Rolling walker (2 wheels) Nurse Communication: Mobility status  Activity Tolerance: Patient tolerated treatment well Patient left: in chair;with call bell/phone within reach  OT Visit Diagnosis: Unsteadiness on feet (R26.81)                [id number] OT  Time Calculation (min): 20 min Charges:  OT General Charges $OT Visit: 1 Visit OT Evaluation $OT Eval Moderate Complexity: 1 Mod  01/12/2023  RP, OTR/L  Acute Rehabilitation Services  Office:  [phone]   Metta Clines 01/12/2023, 9:01 AM

## 2023-01-13 ENCOUNTER — Other Ambulatory Visit: Payer: Self-pay

## 2023-01-13 DIAGNOSIS — I480 Paroxysmal atrial fibrillation: Secondary | ICD-10-CM

## 2023-01-13 LAB — HEPARIN LEVEL (UNFRACTIONATED): Heparin Unfractionated: 0.33 IU/mL (ref 0.30–0.70)

## 2023-01-13 MED ORDER — AMIODARONE HCL IN DEXTROSE 360-4.14 MG/200ML-% IV SOLN
30.0000 mg/h | INTRAVENOUS | Status: DC
  Administered 2023-01-13: 30 mg/h via INTRAVENOUS
  Filled 2023-01-13: qty 200

## 2023-01-13 MED ORDER — HEPARIN (PORCINE) 25000 UT/250ML-% IV SOLN
1100.0000 [IU]/h | INTRAVENOUS | Status: DC
  Administered 2023-01-13: 1100 [IU]/h via INTRAVENOUS
  Filled 2023-01-13: qty 250

## 2023-01-13 MED ORDER — METOPROLOL TARTRATE 5 MG/5ML IV SOLN: 10.0000 mg | INTRAVENOUS | Status: AC

## 2023-01-13 MED ORDER — AMIODARONE LOAD VIA INFUSION
150.0000 mg | Freq: Once | INTRAVENOUS | Status: AC
  Administered 2023-01-13: 150 mg via INTRAVENOUS
  Filled 2023-01-13: qty 83.34

## 2023-01-13 MED ORDER — METOPROLOL TARTRATE 5 MG/5ML IV SOLN
INTRAVENOUS | Status: AC
  Administered 2023-01-13: 10 mg via INTRAVENOUS
  Filled 2023-01-13: qty 10

## 2023-01-13 MED ORDER — AMIODARONE HCL IN DEXTROSE 360-4.14 MG/200ML-% IV SOLN
60.0000 mg/h | INTRAVENOUS | Status: AC
  Administered 2023-01-13 (×2): 60 mg/h via INTRAVENOUS
  Filled 2023-01-13 (×2): qty 200

## 2023-01-13 NOTE — Progress Notes (Signed)
[place] for heparin Indication: atrial fibrillation  Allergies  Allergen Reactions   Lisinopril Other (See Comments) and Cough    High potassium   Lipitor [Atorvastatin] Other (See Comments)    Myalgias    Patient Measurements: Height: 5' 5"$  (165.1 cm) Weight: 72.6 kg (160 lb) IBW/kg (Calculated) : 57 Heparin Dosing Weight: 72kg  Vital Signs: Temp: 98.6 F (37 C) (02/11 0800) Temp Source: Oral (02/11 0800) BP: 122/94 (02/11 0900) Pulse Rate: 112 (02/11 0900)  Labs: Recent Labs    01/11/23 1250 01/11/23 1341 01/12/23 0358  HGB 10.9* 11.7* 11.1*  HCT 32.0* 35.0* 33.2*  PLT  --  87* 95*  APTT  --  28  --   LABPROT  --  15.5*  --   INR  --  1.2  --   CREATININE  --  0.71 0.60    Estimated Creatinine Clearance: 68.1 mL/min (by C-G formula based on SCr of 0.6 mg/dL).   Medical History: Past Medical History:  Diagnosis Date   Allergy    Lisinopril and lipitor   Anxiety and depression    Aortic atherosclerosis ([place]) 03/2020   CT Chest: 2 V (LAD & LCx) Coronary Atherosclerosis, Aortic Atherosclerosis (no aneurysm).  Mild centrilobular emphysema with mild diffuse bronchial thickening; several scattered small solitary pulmonary nodules (largest 5.6 mm in anterior left upper lobe)   Breast cancer ([place]) 12/2021   Right breast ILC   Carotid artery plaque, bilateral 10/2015   Mild to moderate plaque L>R without significant stenosis   COPD (chronic obstructive pulmonary disease) (HCC)    Coronary Artery Calcification - Score 79    Coronary Calcium Score 79.  LAD and circumflex calcification noted.  Normal ascending aorta with mild calcification.   Current every day smoker    pt quit smoking April 2023   Emphysema lung [facility])    Noted on chest CT   Family history of breast cancer 02/21/2022   Family history of prostate cancer 02/21/2022   Hyperlipidemia    Hypertension    Controlled with amlodipine   Prediabetes    Skin cancer  2021   Remove 2022      Assessment: 42 yoF admitted for AAA repair now c/b AF. Pharmacy asked to start IV heparin without bolus. No labs today but CBC stable yesterday, no AC PTA. Will use lower heparin level goal given recent surgery.  Goal of Therapy:  Heparin level 0.3-0.5 units/ml Monitor platelets by anticoagulation protocol: Yes   Plan:  Start heparin 1100 units/h no bolus Check heparin level in 6h  Arrie Senate, PharmD, [place], [facility] Clinical Pharmacist [phone] Please check AMION for all [facility] Pharmacy numbers 01/13/2023

## 2023-01-13 NOTE — Consult Note (Signed)
Cardiology Consultation   Patient ID: Ashley Robertson MRN: [id number]; DOB: [birth year]  Admit date: 01/11/2023 Date of Consult: 01/13/2023  PCP:  Cari Caraway, [place] Providers Cardiologist:  Glenetta Hew, MD   Trula Slade     Patient Profile:   Ashley Robertson is a 67 y.o. female with a hx of open repair of an abdominal aortic aneurysm who is being seen 01/13/2023 for the evaluation of postoperative atrial fibrillation at the request of [place] . Has a history of breast cancer on with mildly decreased ejection fraction see below; COPD  Thromboembolic risk factors ( age -74 , HTN-1, Vasc disease -1, Gender-1) for a CHADSVASc Score of >=4   History of Present Illness:   [name] breast cancer while on chemotherapy.  Mildly depressed LV function some dyspnea on exertion in the context of known COPD with recent discontinuation of smoking developed atrial fibrillation following open AAA repair.  Unassociated with palpitations and no prior palpitations  Thromboembolic risk factors are as above  DATE TEST EF   4/21 CaScore  79  10//23 Echo  60% %   1/24 Echo   50-55% %              Date Cr K Hgb  2/24 0.6 3.5 11.1<<9.2            Past Medical History:  Diagnosis Date   Allergy    Lisinopril and lipitor   Anxiety and depression    Aortic atherosclerosis ([place]) 03/2020   CT Chest: 2 V (LAD & LCx) Coronary Atherosclerosis, Aortic Atherosclerosis (no aneurysm).  Mild centrilobular emphysema with mild diffuse bronchial thickening; several scattered small solitary pulmonary nodules (largest 5.6 mm in anterior left upper lobe)   Breast cancer ([place]) 12/2021   Right breast ILC   Carotid artery plaque, bilateral 10/2015   Mild to moderate plaque L>R without significant stenosis   COPD (chronic obstructive pulmonary disease) (HCC)    Coronary Artery Calcification - Score 79    Coronary Calcium Score 79.  LAD and circumflex calcification noted.  Normal ascending  aorta with mild calcification.   Current every day smoker    pt quit smoking April 2023   Emphysema lung [facility])    Noted on chest CT   Family history of breast cancer 02/21/2022   Family history of prostate cancer 02/21/2022   Hyperlipidemia    Hypertension    Controlled with amlodipine   Prediabetes    Skin cancer 2021   Remove 2022    Past Surgical History:  Procedure Laterality Date   BREAST BIOPSY Right    times 3   BREAST RECONSTRUCTION WITH PLACEMENT OF TISSUE EXPANDER AND ALLODERM Right 07/26/2022   Procedure: RIGHT BREAST RECONSTRUCTION WITH PLACEMENT OF TISSUE EXPANDER AND ALLODERM;  Surgeon: Irene Limbo, MD;  Location: [place];  Service: Plastics;  Laterality: Right;   [place]   Rhinoplasty   MASTECTOMY Right    MASTECTOMY W/ SENTINEL NODE BIOPSY Right 07/26/2022   Procedure: RIGHT MASTECTOMY, RIGHT AXILLARY SENTINEL NODE BIOPSY;  Surgeon: Rolm Bookbinder, MD;  Location: [place];  Service: General;  Laterality: Right;  GEN & PEC BLOCK   PORTACATH PLACEMENT Left 03/06/2022   Procedure: INSERTION PORT-A-CATH;  Surgeon: Rolm Bookbinder, MD;  Location: [place];  Service: General;  Laterality: Left;   RADIOACTIVE SEED GUIDED AXILLARY SENTINEL LYMPH NODE Right 07/26/2022   Procedure: RADIOACTIVE SEED GUIDED  AXILLARY SENTINEL LYMPH NODE DISSECTION;  Surgeon: Rolm Bookbinder, MD;  Location: [place];  Service: General;  Laterality: Right;   [place] ECHOCARDIOGRAM  10/2016   EF 55 to 60%.  Normal systolic and diastolic function.  No ASD/PFO   TUBAL LIGATION  1991       Inpatient Medications: Scheduled Meds:  sodium chloride   Intravenous Once   Chlorhexidine Gluconate Cloth  6 each Topical Daily   docusate sodium  100 mg Oral Daily   fluticasone furoate-vilanterol  1 puff Inhalation Daily   And   umeclidinium bromide  1 puff Inhalation Daily   heparin   5,000 Units Subcutaneous Q8H   metoprolol tartrate  2.5 mg Intravenous Q6H   morphine   Intravenous Q4H   [START ON 01/14/2023] pantoprazole  40 mg Oral Daily   pantoprazole (PROTONIX) IV  40 mg Intravenous QHS   Continuous Infusions:  sodium chloride     sodium chloride 100 mL/hr at 01/13/23 0654   sodium chloride Stopped (01/12/23 0808)   magnesium sulfate bolus IVPB     PRN Meds: sodium chloride, sodium chloride, acetaminophen **OR** acetaminophen, albuterol, bisacodyl, guaiFENesin-dextromethorphan, hydrALAZINE, labetalol, magnesium sulfate bolus IVPB, metoprolol tartrate, naloxone **AND** sodium chloride flush, ondansetron, phenol, potassium chloride  Allergies:    Allergies  Allergen Reactions   Lisinopril Other (See Comments) and Cough    High potassium   Lipitor [Atorvastatin] Other (See Comments)    Myalgias    Social History:   Social History   Socioeconomic History   Marital status: Married    Spouse name: Not on file   Number of children: 2   Years of education: Not on file   Highest education level: Bachelor's degree (e.g., BA, AB, BS)  Occupational History   Occupation: OR Optician, dispensing: [employer]    Comment: Retired  Tobacco Use   Smoking status: Former    Packs/day: 1.00    Years: 40.00    Total pack years: 40.00    Types: Cigarettes    Quit date: 04/01/2022    Years since quitting: 0.7   Smokeless tobacco: Never  Vaping Use   Vaping Use: Former   Devices: zero nicotine vape  Substance and Sexual Activity   Alcohol use: Yes    Alcohol/week: 6.0 standard drinks of alcohol    Types: 6 Glasses of wine per week    Comment: social 4-6 glasses wine weekly per pt   Drug use: No   Sexual activity: Not Currently    Birth control/protection: Post-menopausal, Surgical    Comment: BTL  Other Topics Concern   Not on file  Social History Narrative   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  She used to work at [employer].     Has 2 daughters.   Quit smoking 04/01/22.   Drinks 4 to 6 cups of coffee a day.      Currently trying Intermittent Fasting diet for weight loss.  Does not routinely exercise.   Social Determinants of Health   Financial Resource Strain: Low Risk  (02/21/2022)   Overall Financial Resource Strain (CARDIA)    Difficulty of Paying Living Expenses: Not hard at all  Food Insecurity: No Food Insecurity (01/12/2023)   Hunger Vital Sign    Worried About Running Out of Food in the Last Year: Never true  Ran Out of Food in the Last Year: Never true  Transportation Needs: No Transportation Needs (01/12/2023)   PRAPARE - Hydrologist (Medical): No    Lack of Transportation (Non-Medical): No  Physical Activity: Not on file  Stress: Not on file  Social Connections: Not on file  Intimate Partner Violence: Not on file    Family History:     Family History  Problem Relation Age of Onset   Transient ischemic attack Mother 24   Parkinson's disease Father    Hyperlipidemia Father    Hypertension Father    Cancer - Other Father        sarcoma--left shoulder   Prostate cancer Father        dx after 87   Healthy Sister    Hypertension Sister    Lung cancer Brother        dx 19s   Healthy Brother    Healthy Brother    Skin cancer Maternal Aunt    Breast cancer Paternal Aunt        dx after 17   Lung cancer Maternal Grandmother        dx 47s   Liver cancer Maternal Grandfather        dx 84s   Breast cancer Niece        dx 64s     ROS:  Please see the history of present illness.   All other ROS reviewed and negative.     Physical Exam/Data:   Vitals:   01/13/23 0800 01/13/23 0814 01/13/23 0830 01/13/23 0900  BP: (!) 145/98   (!) 122/94  Pulse: (!) 127  (!) 113 (!) 112  Resp: (!) 24 19 (!) 22 16  Temp: 98.6 F (37 C)     TempSrc: Oral     SpO2: 96% 97% 97% 94%  Weight:      Height:         Intake/Output Summary (Last 24 hours) at 01/13/2023 0921 Last data filed at 01/13/2023 0600 Gross per 24 hour  Intake 2095.56 ml  Output 1300 ml  Net 795.56 ml      01/11/2023    5:40 AM 01/04/2023    8:42 AM 01/02/2023    7:48 AM  Last 3 Weights  Weight (lbs) 160 lb 161 lb 159 lb  Weight (kg) 72.576 kg 73.029 kg 72.122 kg     Body mass index is 26.63 kg/m.  General:  Well nourished, well developed, in no acute distress  HEENT: normal  Neck: no JVD Vascular: No carotid bruits; Distal pulses 2+ bilaterally Cardiac:  normal S1, S2; RRR; no murmur   Lungs:  clear to auscultation bilaterally, no wheezing, rhonchi or rales  Abd: soft,  Ext: no edema Musculoskeletal:  No deformities, BUE and BLE strength normal and equal Skin: warm and dry  Neuro:  CNs 2-12 intact, no focal abnormalities noted Psych:  Normal affect   EKG:  The EKG was personally reviewed and demonstrates: Pending Telemetry:  Telemetry was personally reviewed and demonstrates: Onset of atrial fibrillation at about 0000 hrs.  Relevant CV Studies: (As above)   Laboratory Data:  High Sensitivity Troponin:  No results for input(s): "TROPONINIHS" in the last 720 hours.   Chemistry Recent Labs  Lab 01/11/23 1250 01/11/23 1341 01/12/23 0358  NA 140 138 136  K 4.4 4.3 3.5  CL  --  107 103  CO2  --  21* 24  GLUCOSE  --  149* 118*  BUN  --  10 9  CREATININE  --  0.71 0.60  CALCIUM  --  7.8* 8.2*  MG  --  1.7 1.7  GFRNONAA  --  >60 >60  ANIONGAP  --  10 9    Recent Labs  Lab 01/12/23 0358  PROT 5.1*  ALBUMIN 2.9*  AST 24  ALT 16  ALKPHOS 64  BILITOT 0.7   Lipids No results for input(s): "CHOL", "TRIG", "HDL", "LABVLDL", "LDLCALC", "CHOLHDL" in the last 168 hours.  Hematology Recent Labs  Lab 01/11/23 1250 01/11/23 1341 01/12/23 0358  WBC  --  8.9 9.5  RBC  --  3.57* 3.42*  HGB 10.9* 11.7* 11.1*  HCT 32.0* 35.0* 33.2*  MCV  --  98.0 97.1  MCH  --  32.8 32.5  MCHC  --  33.4 33.4  RDW  --   15.4 15.6*  PLT  --  87* 95*   Thyroid No results for input(s): "TSH", "FREET4" in the last 168 hours.  BNPNo results for input(s): "BNP", "PROBNP" in the last 168 hours.  DDimer No results for input(s): "DDIMER" in the last 168 hours.   Radiology/Studies:  DG Chest Port 1 View  Result Date: 01/12/2023 CLINICAL DATA:  Status post AAA repair EXAM: PORTABLE CHEST 1 VIEW COMPARISON:  01/11/2023 FINDINGS: Right jugular central venous catheter with the tip projecting over the SVC. Left-sided Port-A-Cath with the tip projecting over the cavoatrial junction. Nasogastric tube with the tip coursing inferior to the diaphragm and excluded from the field of view. Bibasilar atelectasis versus scarring. No pleural effusion or pneumothorax. Heart and mediastinal contours are unremarkable. No acute osseous abnormality. Benign chondroid lesion in the proximal right humeral metaphysis likely reflecting an enchondroma. IMPRESSION: 1. Right jugular central venous catheter with the tip projecting over the SVC. 2. Nasogastric tube with the tip coursing inferior to the diaphragm and excluded from the field of view. Electronically Signed   By: Kathreen Devoid M.D.   On: 01/12/2023 09:23   DG Chest Port 1 View  Result Date: 01/11/2023 CLINICAL DATA:  Status post abdominal aortic aneurysm repair EXAM: PORTABLE CHEST 1 VIEW COMPARISON:  None Available. FINDINGS: Lines/tubes: Left chest wall port tip projects over the superior cavoatrial junction. Right internal jugular venous catheter tip projects over the SVC. Enteric tube tip reaches the diaphragm and terminates below the field of view with side port below the gastroesophageal junction. Surgical clips project over the right axillary region. Chest: Right-greater-than-left linear and interstitial opacities. Pleura: No pneumothorax or pleural effusion. Heart/mediastinum: The heart size and mediastinal contours are within normal limits. Bones: No acute osseous abnormality.  IMPRESSION: 1. Right-greater-than-left linear and interstitial opacities may represent atelectasis or scarring with possible mild superimposed asymmetric pulmonary edema. 2. Support lines and tubes as above. Electronically Signed   By: Darrin Nipper M.D.   On: 01/11/2023 15:53   DG Abd Portable 1V  Result Date: 01/11/2023 CLINICAL DATA:  AAA repair EXAM: PORTABLE ABDOMEN - 1 VIEW COMPARISON:  CT 12/21/2022 FINDINGS: Single AP view of the abdomen and pelvis. Nasogastric tube terminates at the distal stomach. Nonobstructive bowel-gas pattern. Increased density projecting over the expected course of the abdominal aorta is presumably postoperative. No gross free intraperitoneal air. IMPRESSION: No acute findings. Electronically Signed   By: Abigail Miyamoto M.D.   On: 01/11/2023 15:29     Assessment and Plan:   Atrial fibrillation with rapid ventricular response Hypertension Breast cancer chemotherapy mildly diminished LV function AAA surgical repair COPD  The patient has atrial fibrillation with a rapid rate.  Will begin amiodarone for rate slowing and possible cardioversion and that the atrial fibrillation is of less than 24 hours duration.  I will wait to speak with Dr. Trula Slade as to anticoagulation postoperatively.  If he would like not to anticoagulate, we will cardiovert her today and we will keep her n.p.o. until that decision is made.  If he is okay with anticoagulation we will begin, without a bolus, and then anticipate cardioversion in the morning if she has not cardioverted her on her own.  Need for long-term anticoagulation will need to be addressed subsequently secondary atrial fibrillation is associated with high risks of recurrence of atrial fibrillation in a similar thromboembolic risk    99991111 Score =   4 (As above)              For questions or updates, please contact [place] Please consult [email or web address] for contact info under    Signed, Virl Axe, MD   01/13/2023 9:21 AM

## 2023-01-13 NOTE — Progress Notes (Signed)
[place] for heparin Indication: atrial fibrillation  Allergies  Allergen Reactions   Lisinopril Other (See Comments) and Cough    High potassium   Lipitor [Atorvastatin] Other (See Comments)    Myalgias    Patient Measurements: Height: 5' 5"$  (165.1 cm) Weight: 72.6 kg (160 lb) IBW/kg (Calculated) : 57 Heparin Dosing Weight: 72kg  Vital Signs: Temp: 96.9 F (36.1 C) (02/11 1600) Temp Source: Axillary (02/11 1600) BP: 112/75 (02/11 1800) Pulse Rate: 65 (02/11 1800)  Labs: Recent Labs    01/11/23 1250 01/11/23 1341 01/12/23 0358 01/13/23 1800  HGB 10.9* 11.7* 11.1*  --   HCT 32.0* 35.0* 33.2*  --   PLT  --  87* 95*  --   APTT  --  28  --   --   LABPROT  --  15.5*  --   --   INR  --  1.2  --   --   HEPARINUNFRC  --   --   --  0.33  CREATININE  --  0.71 0.60  --      Estimated Creatinine Clearance: 68.1 mL/min (by C-G formula based on SCr of 0.6 mg/dL).   Medical History: Past Medical History:  Diagnosis Date   Allergy    Lisinopril and lipitor   Anxiety and depression    Aortic atherosclerosis ([place]) 03/2020   CT Chest: 2 V (LAD & LCx) Coronary Atherosclerosis, Aortic Atherosclerosis (no aneurysm).  Mild centrilobular emphysema with mild diffuse bronchial thickening; several scattered small solitary pulmonary nodules (largest 5.6 mm in anterior left upper lobe)   Breast cancer ([place]) 12/2021   Right breast ILC   Carotid artery plaque, bilateral 10/2015   Mild to moderate plaque L>R without significant stenosis   COPD (chronic obstructive pulmonary disease) (HCC)    Coronary Artery Calcification - Score 79    Coronary Calcium Score 79.  LAD and circumflex calcification noted.  Normal ascending aorta with mild calcification.   Current every day smoker    pt quit smoking April 2023   Emphysema lung [facility])    Noted on chest CT   Family history of breast cancer 02/21/2022   Family history of prostate cancer 02/21/2022    Hyperlipidemia    Hypertension    Controlled with amlodipine   Prediabetes    Skin cancer 2021   Remove 2022      Assessment: 48 yoF admitted for AAA repair now c/b AF. Pharmacy asked to start IV heparin without bolus. No labs today but CBC stable yesterday, no AC PTA. Will use lower heparin level goal given recent surgery.  Heparin level 0.33 which is therapeutic  Goal of Therapy:  Heparin level 0.3-0.5 units/ml Monitor platelets by anticoagulation protocol: Yes   Plan:  Continue heparin at 1100 units/h (no bolus) Check heparin level in [place], PharmD, [place] Clinical Pharmacist Please see AMION for all Pharmacists' Contact Phone Numbers 01/13/2023, 7:01 PM

## 2023-01-13 NOTE — Progress Notes (Signed)
    Subjective  - POD #2 status post open repair of juxtarenal abdominal aneurysm  Passed flatus overnight.  She walked 2 laps around the nurses station and has been sitting up this morning.  She is not having a significant amount of pain  The patient went into A-fib last night with heart rate in the 120s.  Her blood pressure remained stable.   Physical Exam:  Tacky in the 110,s,, irregular Palpable posterior tibial pulses bilaterally Abdomen is soft and nontender.  Incision is healing appropriately       Assessment/Plan:  POD #2  CV: Went into A-fib last night with heart rate in the 110's.  Blood pressure remained stable.  Cardiology has been consulted.  The plan is for heparin today without a bolus, followed by cardioversion tomorrow.  Appreciate cardiology assistance. ID: No active infectious issues Pulmonary: Continue incentive spirometry, wean O2 GI: She passed flatus last night and so I removed her NG tube.  I will start her on a clear diet today.  She will be n.p.o. after midnight Prophylaxis: SQ heparin and Protonix -Needs to walk again today.  PT OT consult.  Wells Shaniyah Wix 01/13/2023 11:13 AM --  Vitals:   01/13/23 0830 01/13/23 0900  BP:  (!) 122/94  Pulse: (!) 113 (!) 112  Resp: (!) 22 16  Temp:    SpO2: 97% 94%    Intake/Output Summary (Last 24 hours) at 01/13/2023 1113 Last data filed at 01/13/2023 1000 Gross per 24 hour  Intake 2292 ml  Output 1150 ml  Net 1142 ml     Laboratory CBC    Component Value Date/Time   WBC 9.5 01/12/2023 0358   HGB 11.1 (L) 01/12/2023 0358   HGB 13.7 12/06/2022 1226   HCT 33.2 (L) 01/12/2023 0358   PLT 95 (L) 01/12/2023 0358   PLT 178 12/06/2022 1226    BMET    Component Value Date/Time   NA 136 01/12/2023 0358   NA 135 07/24/2021 0853   K 3.5 01/12/2023 0358   CL 103 01/12/2023 0358   CO2 24 01/12/2023 0358   GLUCOSE 118 (H) 01/12/2023 0358   BUN 9 01/12/2023 0358   BUN 12 07/24/2021 0853   CREATININE  0.60 01/12/2023 0358   CREATININE 0.62 12/06/2022 1226   CALCIUM 8.2 (L) 01/12/2023 0358   GFRNONAA >60 01/12/2023 0358   GFRNONAA >60 12/06/2022 1226   GFRAA 107 11/03/2020 1206    COAG Lab Results  Component Value Date   INR 1.2 01/11/2023   INR 1.0 01/04/2023   No results found for: "PTT"  Antibiotics Anti-infectives (From admission, onward)    Start     Dose/Rate Route Frequency Ordered Stop   01/11/23 2000  ceFAZolin (ANCEF) IVPB 2g/100 mL premix        2 g 200 mL/hr over 30 Minutes Intravenous Every 8 hours 01/11/23 1505 01/12/23 0418   01/11/23 0554  ceFAZolin (ANCEF) IVPB 2g/100 mL premix        2 g 200 mL/hr over 30 Minutes Intravenous 30 min pre-op 01/11/23 0554 01/11/23 1220        V. Leia Alf, M.D., [facility] [place] Office: [phone] Pager:  [phone]

## 2023-01-14 ENCOUNTER — Encounter (HOSPITAL_COMMUNITY): Payer: Self-pay | Admitting: Surgery

## 2023-01-14 ENCOUNTER — Other Ambulatory Visit (HOSPITAL_COMMUNITY): Payer: Self-pay

## 2023-01-14 DIAGNOSIS — I9789 Other postprocedural complications and disorders of the circulatory system, not elsewhere classified: Secondary | ICD-10-CM

## 2023-01-14 DIAGNOSIS — I4891 Unspecified atrial fibrillation: Secondary | ICD-10-CM

## 2023-01-14 LAB — BASIC METABOLIC PANEL
Anion gap: 8 (ref 5–15)
BUN: 8 mg/dL (ref 8–23)
CO2: 26 mmol/L (ref 22–32)
Calcium: 8.1 mg/dL — ABNORMAL LOW (ref 8.9–10.3)
Chloride: 101 mmol/L (ref 98–111)
Creatinine, Ser: 0.57 mg/dL (ref 0.44–1.00)
GFR, Estimated: >60 mL/min (ref >60)
Glucose, Bld: 106 mg/dL — ABNORMAL HIGH (ref 70–99)
Potassium: 3 mmol/L — ABNORMAL LOW (ref 3.5–5.1)
Sodium: 135 mmol/L (ref 135–145)

## 2023-01-14 LAB — CBC
HCT: 28 % — ABNORMAL LOW (ref 36.0–46.0)
Hemoglobin: 9.4 g/dL — ABNORMAL LOW (ref 12.0–15.0)
MCH: 32.9 pg (ref 26.0–34.0)
MCHC: 33.6 g/dL (ref 30.0–36.0)
MCV: 97.9 fL (ref 80.0–100.0)
Platelets: 84 10*3/uL — ABNORMAL LOW (ref 150–400)
RBC: 2.86 MIL/uL — ABNORMAL LOW (ref 3.87–5.11)
RDW: 15.2 % (ref 11.5–15.5)
WBC: 6.4 10*3/uL (ref 4.0–10.5)
nRBC: 0 % (ref 0.0–0.2)

## 2023-01-14 LAB — HEPARIN LEVEL (UNFRACTIONATED): Heparin Unfractionated: 0.42 IU/mL (ref 0.30–0.70)

## 2023-01-14 LAB — MAGNESIUM: Magnesium: 1.8 mg/dL (ref 1.7–2.4)

## 2023-01-14 LAB — GLUCOSE, CAPILLARY: Glucose-Capillary: 102 mg/dL — ABNORMAL HIGH (ref 70–99)

## 2023-01-14 MED ORDER — MORPHINE SULFATE (PF) 2 MG/ML IV SOLN: 2.0000 mg | INTRAVENOUS | Status: DC | PRN

## 2023-01-14 MED ORDER — POTASSIUM CHLORIDE CRYS ER 20 MEQ PO TBCR
40.0000 meq | EXTENDED_RELEASE_TABLET | ORAL | Status: AC
  Administered 2023-01-14 (×2): 40 meq via ORAL
  Filled 2023-01-14 (×2): qty 2

## 2023-01-14 MED ORDER — AMLODIPINE BESYLATE 5 MG PO TABS
5.0000 mg | ORAL_TABLET | Freq: Every day | ORAL | Status: DC
  Administered 2023-01-14 – 2023-01-15 (×2): 5 mg via ORAL
  Filled 2023-01-14 (×2): qty 1

## 2023-01-14 MED ORDER — AMIODARONE HCL 200 MG PO TABS: 200.0000 mg | ORAL_TABLET | Freq: Every day | ORAL | Status: DC

## 2023-01-14 MED ORDER — OXYCODONE-ACETAMINOPHEN 5-325 MG PO TABS
1.0000 | ORAL_TABLET | ORAL | Status: DC | PRN
  Administered 2023-01-14 (×2): 1 via ORAL
  Filled 2023-01-14 (×3): qty 1

## 2023-01-14 MED ORDER — AMIODARONE HCL 200 MG PO TABS
400.0000 mg | ORAL_TABLET | Freq: Two times a day (BID) | ORAL | Status: DC
  Administered 2023-01-14 – 2023-01-15 (×3): 400 mg via ORAL
  Filled 2023-01-14 (×3): qty 2

## 2023-01-14 MED ORDER — MAGNESIUM SULFATE 2 GM/50ML IV SOLN
2.0000 g | Freq: Once | INTRAVENOUS | Status: AC
  Administered 2023-01-14: 2 g via INTRAVENOUS
  Filled 2023-01-14: qty 50

## 2023-01-14 MED ORDER — APIXABAN 5 MG PO TABS
5.0000 mg | ORAL_TABLET | Freq: Two times a day (BID) | ORAL | Status: DC
  Administered 2023-01-14 – 2023-01-15 (×3): 5 mg via ORAL
  Filled 2023-01-14 (×3): qty 1

## 2023-01-14 MED ORDER — METOPROLOL TARTRATE 12.5 MG HALF TABLET
12.5000 mg | ORAL_TABLET | Freq: Two times a day (BID) | ORAL | Status: DC
  Administered 2023-01-14: 12.5 mg via ORAL
  Filled 2023-01-14 (×2): qty 1

## 2023-01-14 NOTE — TOC Initial Note (Signed)
Transition of Care [facility]) - Initial/Assessment Note    Patient Details  Name: Ashley Robertson MRN: [id number] Date of Birth: 11/20/1956  Transition of Care [facility]) CM/SW Contact:    Erenest Rasher, RN Phone Number: [phone] 01/14/2023, 1:11 PM  Clinical Narrative:                 TOC CM provide pt with Eliquis 30 day free trial card. [facility] scheduled to come for [facility] after dc. Pt states she has received a call. No DME needed.   Patient can afford her medications. Eliquis copay is $47.00. Made pt aware.   Expected Discharge Plan: [place] Barriers to Discharge: Continued Medical Work up   Patient Goals and CMS Choice Patient states their goals for this hospitalization and ongoing recovery are:: wants patient to recover CMS Medicare.gov Compare Post Acute Care list provided to:: Patient Choice offered to / list presented to : Spouse      Expected Discharge Plan and Services   Discharge Planning Services: CM Consult Post Acute Care Choice: [place] arrangements for the past 2 months: [place] Arranged: RN, PT [facility] [place] Agency: [place] Date [place]: 01/14/23 Time HH Agency Contacted: 56 Representative spoke with at [place]: Penni Homans  Prior Living Arrangements/Services Living arrangements for the past 2 months: [place] with:: Spouse Patient language and need for interpreter reviewed:: Yes Do you feel safe going back to the place where you live?: Yes      Need for Family Participation in Patient Care: No (Comment) Care giver support system in place?: Yes (comment)   Criminal Activity/Legal Involvement Pertinent to Current Situation/Hospitalization: No - Comment as needed  Activities of Daily Living Home Assistive Devices/Equipment: Blood pressure cuff, Scales ADL Screening (condition at time of admission) Patient's cognitive ability adequate to safely  complete daily activities?: Yes Is the patient deaf or have difficulty hearing?: No Does the patient have difficulty seeing, even when wearing glasses/contacts?: No Does the patient have difficulty concentrating, remembering, or making decisions?: No Patient able to express need for assistance with ADLs?: Yes Does the patient have difficulty dressing or bathing?: No Independently performs ADLs?: Yes (appropriate for developmental age) Does the patient have difficulty walking or climbing stairs?: No Weakness of Legs: None Weakness of Arms/Hands: None  Permission Sought/Granted Permission sought to share information with : Case Manager, PCP, Family Supports    Share Information with NAME: Skip Cozier     Permission granted to share info w Relationship: husband  Permission granted to share info w Contact Information: [phone]  Emotional Assessment Appearance:: Appears stated age Attitude/Demeanor/Rapport: Engaged Affect (typically observed): Accepting Orientation: : Oriented to Self, Oriented to Place, Oriented to  Time, Oriented to Situation   Psych Involvement: No (comment)  Admission diagnosis:  AAA (abdominal aortic aneurysm) ([place]) [I71.40] S/P AAA repair [id number], Z86.79] Patient Active Problem List   Diagnosis Date Noted   AAA (abdominal aortic aneurysm) ([place]) 01/11/2023   S/P AAA repair 01/11/2023   Osteopenia 10/21/2022   Aortic atherosclerosis ([name]) 08/10/2022   S/P mastectomy, right 07/26/2022   Carotid atherosclerosis 03/28/2022   Coronary artery disease 03/28/2022   Pulmonary emphysema ([place]) 03/28/2022   Recurrent major depression in remission ([place]) 03/28/2022   Port-A-Cath in place 03/27/2022   Genetic  testing 03/12/2022   Family history of breast cancer 02/21/2022   Family history of prostate cancer 02/21/2022   Hypercholesterolemia 02/21/2022   Malignant neoplasm of overlapping sites of right breast in female, estrogen receptor positive ([place]) 02/16/2022    Bilateral carotid bruits 04/26/2020   Essential hypertension 03/19/2020   Hyperlipidemia with target LDL less than 100 03/19/2020   Prediabetes 03/19/2020   Agatston CAC score, <100 03/15/2020   PCP:  Cari Caraway, MD Pharmacy:   [place] [address] Phone: [phone] Fax: [phone]     Social Determinants of Health (SDOH) Social History: SDOH Screenings   Food Insecurity: No Food Insecurity (01/12/2023)  Housing: Low Risk  (01/12/2023)  Transportation Needs: No Transportation Needs (01/12/2023)  Utilities: Not At Risk (01/12/2023)  Financial Resource Strain: Low Risk  (02/21/2022)  Tobacco Use: Medium Risk (01/14/2023)   SDOH Interventions:     Readmission Risk Interventions     No data to display

## 2023-01-14 NOTE — TOC Benefit Eligibility Note (Signed)
Patient Teacher, English as a foreign language completed.    The patient is currently admitted and upon discharge could be taking Eliquis 5 mg.  The current 30 day co-pay is $47.00.   The patient is currently admitted and upon discharge could be taking Xarelto 20 mg.  The current 30 day co-pay is $47.00.   The patient is insured through [place] [place] Patient Advocate Specialist [place] Patient Advocate Team Direct Number: [phone]  Fax: [phone]

## 2023-01-14 NOTE — Progress Notes (Addendum)
  Progress Note    01/14/2023 7:42 AM 3 Days Post-Op  Subjective:  no complaints   Vitals:   01/14/23 0300 01/14/23 0400  BP: (!) 123/57 119/62  Pulse: (!) 55 (!) 58  Resp: 20 18  Temp: 98.4 F (36.9 C)   SpO2: 93% 96%   Physical Exam: Cardiac:  sinus rhythm Lungs:  non labored Incisions:  abd incision c/d/i Extremities:  palpable PT pulses Abdomen:  soft, NT, ND Neurologic: A&O  CBC    Component Value Date/Time   WBC 6.4 01/14/2023 0114   RBC 2.86 (L) 01/14/2023 0114   HGB 9.4 (L) 01/14/2023 0114   HGB 13.7 12/06/2022 1226   HCT 28.0 (L) 01/14/2023 0114   PLT 84 (L) 01/14/2023 0114   PLT 178 12/06/2022 1226   MCV 97.9 01/14/2023 0114   MCH 32.9 01/14/2023 0114   MCHC 33.6 01/14/2023 0114   RDW 15.2 01/14/2023 0114   LYMPHSABS 0.9 12/06/2022 1226   MONOABS 0.6 12/06/2022 1226   EOSABS 0.1 12/06/2022 1226   BASOSABS 0.1 12/06/2022 1226    BMET    Component Value Date/Time   NA 136 01/12/2023 0358   NA 135 07/24/2021 0853   K 3.5 01/12/2023 0358   CL 103 01/12/2023 0358   CO2 24 01/12/2023 0358   GLUCOSE 118 (H) 01/12/2023 0358   BUN 9 01/12/2023 0358   BUN 12 07/24/2021 0853   CREATININE 0.60 01/12/2023 0358   CREATININE 0.62 12/06/2022 1226   CALCIUM 8.2 (L) 01/12/2023 0358   GFRNONAA >60 01/12/2023 0358   GFRNONAA >60 12/06/2022 1226   GFRAA 107 11/03/2020 1206    INR    Component Value Date/Time   INR 1.2 01/11/2023 1341     Intake/Output Summary (Last 24 hours) at 01/14/2023 9024 Last data filed at 01/14/2023 0600 Gross per 24 hour  Intake 2946.29 ml  Output 800 ml  Net 2146.29 ml     Assessment/Plan:  67 y.o. female is s/p open repair of AAA 3 Days Post-Op   BLE well perfused with palpable PT pulses Heart: sinus rhythm this morning; Cardiology following Lungs: IS, OOB with therapy GI: flatus but no BM, no n/v with clears yesterday  D/c PCA pump    Dagoberto Ligas, PA-C [facility] [phone] 01/14/2023 7:42 AM   I agree with the above.  Have seen and evaluate the patient.  She is postop day #3.  She is scheduled for cardioversion today for atrial fibrillation.  She is on heparin.  She continues to pass flatus.  When she returns for cardioversion, we can begin advancing her diet.  We can also get rid of her PCA and IV fluids  [place] Aadi Bordner

## 2023-01-14 NOTE — Progress Notes (Signed)
Cardiology Progress Note  Patient ID: Ashley Robertson MRN: [id number] DOB: June 09, 1956 Date of Encounter: 01/14/2023  Primary Cardiologist: Glenetta Hew, MD  Subjective   Chief Complaint: None.   HPI: Back in NSR.   ROS:  All other ROS reviewed and negative. Pertinent positives noted in the HPI.     Inpatient Medications  Scheduled Meds:  sodium chloride   Intravenous Once   amiodarone  400 mg Oral BID   Followed by   Derrill Memo ON 01/21/2023] amiodarone  200 mg Oral Daily   Chlorhexidine Gluconate Cloth  6 each Topical Daily   docusate sodium  100 mg Oral Daily   fluticasone furoate-vilanterol  1 puff Inhalation Daily   And   umeclidinium bromide  1 puff Inhalation Daily   metoprolol tartrate  2.5 mg Intravenous Q6H   pantoprazole  40 mg Oral Daily   Continuous Infusions:  sodium chloride     sodium chloride Stopped (01/12/23 0808)   magnesium sulfate bolus IVPB     PRN Meds: sodium chloride, sodium chloride, acetaminophen **OR** acetaminophen, albuterol, bisacodyl, guaiFENesin-dextromethorphan, hydrALAZINE, labetalol, magnesium sulfate bolus IVPB, metoprolol tartrate, morphine injection, ondansetron, oxyCODONE-acetaminophen, phenol, potassium chloride   Vital Signs   Vitals:   01/14/23 0200 01/14/23 0300 01/14/23 0400 01/14/23 0746  BP: (!) 109/57 (!) 123/57 119/62   Pulse: 62 (!) 55 (!) 58   Resp: (!) 21 20 18   $ Temp:  98.4 F (36.9 C)  98.8 F (37.1 C)  TempSrc:  Oral  Oral  SpO2: 92% 93% 96%   Weight:      Height:        Intake/Output Summary (Last 24 hours) at 01/14/2023 0838 Last data filed at 01/14/2023 0600 Gross per 24 hour  Intake 2846.26 ml  Output 800 ml  Net 2046.26 ml      01/11/2023    5:40 AM 01/04/2023    8:42 AM 01/02/2023    7:48 AM  Last 3 Weights  Weight (lbs) 160 lb 161 lb 159 lb  Weight (kg) 72.576 kg 73.029 kg 72.122 kg      Telemetry  Overnight telemetry shows sinus rhythm in the 60s, which I personally reviewed.   ECG  The  most recent ECG shows atrial fibrillation heart rate 115, nonspecific ST-T changes, which I personally reviewed.   Physical Exam   Vitals:   01/14/23 0200 01/14/23 0300 01/14/23 0400 01/14/23 0746  BP: (!) 109/57 (!) 123/57 119/62   Pulse: 62 (!) 55 (!) 58   Resp: (!) 21 20 18   $ Temp:  98.4 F (36.9 C)  98.8 F (37.1 C)  TempSrc:  Oral  Oral  SpO2: 92% 93% 96%   Weight:      Height:        Intake/Output Summary (Last 24 hours) at 01/14/2023 0838 Last data filed at 01/14/2023 0600 Gross per 24 hour  Intake 2846.26 ml  Output 800 ml  Net 2046.26 ml       01/11/2023    5:40 AM 01/04/2023    8:42 AM 01/02/2023    7:48 AM  Last 3 Weights  Weight (lbs) 160 lb 161 lb 159 lb  Weight (kg) 72.576 kg 73.029 kg 72.122 kg    Body mass index is 26.63 kg/m.  General: Well nourished, well developed, in no acute distress Head: Atraumatic, normal size  Eyes: PEERLA, EOMI  Neck: Supple, no JVD Endocrine: No thryomegaly Cardiac: Normal S1, S2; RRR; no murmurs, rubs, or gallops Lungs: Clear to auscultation  bilaterally, no wheezing, rhonchi or rales  Abd: Soft, nontender, no hepatomegaly  Ext: No edema, pulses 2+ Musculoskeletal: No deformities, BUE and BLE strength normal and equal Skin: Warm and dry, no rashes   Neuro: Alert and oriented to person, place, time, and situation, CNII-XII grossly intact, no focal deficits  Psych: Normal mood and affect   Labs  High Sensitivity Troponin:  No results for input(s): "TROPONINIHS" in the last 720 hours.   Cardiac EnzymesNo results for input(s): "TROPONINI" in the last 168 hours. No results for input(s): "TROPIPOC" in the last 168 hours.  Chemistry Recent Labs  Lab 01/11/23 1250 01/11/23 1341 01/12/23 0358  NA 140 138 136  K 4.4 4.3 3.5  CL  --  107 103  CO2  --  21* 24  GLUCOSE  --  149* 118*  BUN  --  10 9  CREATININE  --  0.71 0.60  CALCIUM  --  7.8* 8.2*  PROT  --   --  5.1*  ALBUMIN  --   --  2.9*  AST  --   --  24  ALT  --   --   16  ALKPHOS  --   --  64  BILITOT  --   --  0.7  GFRNONAA  --  >60 >60  ANIONGAP  --  10 9    Hematology Recent Labs  Lab 01/11/23 1341 01/12/23 0358 01/14/23 0114  WBC 8.9 9.5 6.4  RBC 3.57* 3.42* 2.86*  HGB 11.7* 11.1* 9.4*  HCT 35.0* 33.2* 28.0*  MCV 98.0 97.1 97.9  MCH 32.8 32.5 32.9  MCHC 33.4 33.4 33.6  RDW 15.4 15.6* 15.2  PLT 87* 95* 84*   BNPNo results for input(s): "BNP", "PROBNP" in the last 168 hours.  DDimer No results for input(s): "DDIMER" in the last 168 hours.   Radiology  No results found.  Cardiac Studies  TTE 12/19/2022  1. Left ventricular ejection fraction, by estimation, is 50 to 55%. Left  ventricular ejection fraction by 3D volume is 51 %. The left ventricle has  low normal function. The left ventricle demonstrates regional wall motion  abnormalities (see scoring  diagram/findings for description). Left ventricular diastolic parameters  are consistent with Grade I diastolic dysfunction (impaired relaxation).  The average left ventricular global longitudinal strain is -19.6 %. The  global longitudinal strain is  normal.   2. Right ventricular systolic function is normal. The right ventricular  size is normal.   3. The mitral valve is normal in structure. No evidence of mitral valve  regurgitation. No evidence of mitral stenosis.   4. The aortic valve is tricuspid. There is mild calcification of the  aortic valve. Aortic valve regurgitation is not visualized. Aortic valve  sclerosis is present, with no evidence of aortic valve stenosis.   5. The inferior vena cava is normal in size with greater than 50%  respiratory variability, suggesting right atrial pressure of 3 mmHg.   Patient Profile  Ashley Robertson is a 67 y.o. female with breast cancer on HER2 therapy, hypertension, emphysema/tobacco abuse, coronary calcium, AAA who was admitted on 01/11/2023 for open AAA repair.  Course complicated by postoperative atrial fibrillation and cardiology was  consulted.  Assessment & Plan   # Postoperative atrial fibrillation -Brief A-fib of less than 24 hours duration.  Has converted back to sinus rhythm on amiodarone.  Plan for 1 month of amiodarone therapy to get her through her recovery period.  400 mg twice daily  for 7 days.  Then transition to 200 mg daily for 21 days then stop. -Would recommend 1 month of anticoagulation with cardioversion on amiodarone.  Hemoglobin is trending down.  We will discuss this with vascular surgery.  I have stopped her heparin drip in the meantime.  She could easily pursue 1 month of Eliquis 5 mg twice daily.  We will follow-up with surgery. -Recent echo normal.  No signs of congestive heart failure. -I do not anticipate long-term amiodarone therapy or anticoagulation therapy.  She likely can just pursue an outpatient monitor to determine if her A-fib recurs.  I will defer this to her primary cardiologist.   For questions or updates, please contact [place] Please consult [email or web address] for contact info under        Signed, [place] B[place] T. Audie Box, MD, [place]  01/14/2023 8:38 AM

## 2023-01-15 ENCOUNTER — Other Ambulatory Visit (HOSPITAL_COMMUNITY): Payer: Self-pay

## 2023-01-15 ENCOUNTER — Encounter: Payer: Self-pay | Admitting: Hematology

## 2023-01-15 LAB — BASIC METABOLIC PANEL
Anion gap: 6 (ref 5–15)
BUN: 7 mg/dL — ABNORMAL LOW (ref 8–23)
CO2: 27 mmol/L (ref 22–32)
Calcium: 8.1 mg/dL — ABNORMAL LOW (ref 8.9–10.3)
Chloride: 101 mmol/L (ref 98–111)
Creatinine, Ser: 0.57 mg/dL (ref 0.44–1.00)
GFR, Estimated: >60 mL/min (ref >60)
Glucose, Bld: 122 mg/dL — ABNORMAL HIGH (ref 70–99)
Potassium: 3.9 mmol/L (ref 3.5–5.1)
Sodium: 134 mmol/L — ABNORMAL LOW (ref 135–145)

## 2023-01-15 LAB — CBC
HCT: 27.2 % — ABNORMAL LOW (ref 36.0–46.0)
Hemoglobin: 9.3 g/dL — ABNORMAL LOW (ref 12.0–15.0)
MCH: 32.4 pg (ref 26.0–34.0)
MCHC: 34.2 g/dL (ref 30.0–36.0)
MCV: 94.8 fL (ref 80.0–100.0)
Platelets: 102 10*3/uL — ABNORMAL LOW (ref 150–400)
RBC: 2.87 MIL/uL — ABNORMAL LOW (ref 3.87–5.11)
RDW: 14.9 % (ref 11.5–15.5)
WBC: 4.7 10*3/uL (ref 4.0–10.5)
nRBC: 0 % (ref 0.0–0.2)

## 2023-01-15 LAB — HEPARIN LEVEL (UNFRACTIONATED): Heparin Unfractionated: >1.1 IU/mL — ABNORMAL HIGH (ref 0.30–0.70)

## 2023-01-15 MED ORDER — APIXABAN 5 MG PO TABS
5.0000 mg | ORAL_TABLET | Freq: Two times a day (BID) | ORAL | 1 refills | Status: DC
  Filled 2023-01-15: qty 60, 30d supply, fill #0

## 2023-01-15 MED ORDER — OXYCODONE-ACETAMINOPHEN 5-325 MG PO TABS
1.0000 | ORAL_TABLET | Freq: Four times a day (QID) | ORAL | 0 refills | Status: DC | PRN
  Filled 2023-01-15: qty 20, 5d supply, fill #0

## 2023-01-15 MED ORDER — AMIODARONE HCL 400 MG PO TABS
400.0000 mg | ORAL_TABLET | Freq: Two times a day (BID) | ORAL | 0 refills | Status: DC
  Filled 2023-01-15: qty 14, 7d supply, fill #0

## 2023-01-15 MED ORDER — AMIODARONE HCL 200 MG PO TABS
200.0000 mg | ORAL_TABLET | Freq: Every day | ORAL | 0 refills | Status: DC
  Filled 2023-01-15: qty 21, 21d supply, fill #0

## 2023-01-15 NOTE — Progress Notes (Signed)
Cardiology Progress Note  Patient ID: ANIAS Robertson MRN: [id number] DOB: 10-16-1956 Date of Encounter: 01/15/2023  Primary Cardiologist: Glenetta Hew, MD  Subjective   Chief Complaint: None.  HPI: Maintaining sinus rhythm.  Denies chest pain or trouble breathing.  ROS:  All other ROS reviewed and negative. Pertinent positives noted in the HPI.     Inpatient Medications  Scheduled Meds:  amiodarone  400 mg Oral BID   Followed by   Derrill Memo ON 01/21/2023] amiodarone  200 mg Oral Daily   amLODipine  5 mg Oral Daily   apixaban  5 mg Oral BID   Chlorhexidine Gluconate Cloth  6 each Topical Daily   docusate sodium  100 mg Oral Daily   fluticasone furoate-vilanterol  1 puff Inhalation Daily   And   umeclidinium bromide  1 puff Inhalation Daily   metoprolol tartrate  12.5 mg Oral BID   pantoprazole  40 mg Oral Daily   Continuous Infusions:  sodium chloride     sodium chloride Stopped (01/12/23 0808)   magnesium sulfate bolus IVPB     PRN Meds: sodium chloride, sodium chloride, acetaminophen **OR** acetaminophen, albuterol, bisacodyl, guaiFENesin-dextromethorphan, hydrALAZINE, labetalol, magnesium sulfate bolus IVPB, metoprolol tartrate, morphine injection, ondansetron, oxyCODONE-acetaminophen, phenol, potassium chloride   Vital Signs   Vitals:   01/15/23 0600 01/15/23 0749 01/15/23 0800 01/15/23 0845  BP: (!) 161/77  (!) 147/75   Pulse: (!) 57  63   Resp: 17  14   Temp:  98.6 F (37 C)    TempSrc:  Oral    SpO2: 96%  96% 97%  Weight:      Height:        Intake/Output Summary (Last 24 hours) at 01/15/2023 0857 Last data filed at 01/15/2023 0500 Gross per 24 hour  Intake 967.07 ml  Output 0 ml  Net 967.07 ml      01/11/2023    5:40 AM 01/04/2023    8:42 AM 01/02/2023    7:48 AM  Last 3 Weights  Weight (lbs) 160 lb 161 lb 159 lb  Weight (kg) 72.576 kg 73.029 kg 72.122 kg      Telemetry  Overnight telemetry shows sinus rhythm in the 60s, which I personally  reviewed.   Physical Exam   Vitals:   01/15/23 0600 01/15/23 0749 01/15/23 0800 01/15/23 0845  BP: (!) 161/77  (!) 147/75   Pulse: (!) 57  63   Resp: 17  14   Temp:  98.6 F (37 C)    TempSrc:  Oral    SpO2: 96%  96% 97%  Weight:      Height:        Intake/Output Summary (Last 24 hours) at 01/15/2023 0857 Last data filed at 01/15/2023 0500 Gross per 24 hour  Intake 967.07 ml  Output 0 ml  Net 967.07 ml       01/11/2023    5:40 AM 01/04/2023    8:42 AM 01/02/2023    7:48 AM  Last 3 Weights  Weight (lbs) 160 lb 161 lb 159 lb  Weight (kg) 72.576 kg 73.029 kg 72.122 kg    Body mass index is 26.63 kg/m.  General: Well nourished, well developed, in no acute distress Head: Atraumatic, normal size  Eyes: PEERLA, EOMI  Neck: Supple, no JVD Endocrine: No thryomegaly Cardiac: Normal S1, S2; RRR; no murmurs, rubs, or gallops Lungs: Clear to auscultation bilaterally, no wheezing, rhonchi or rales  Abd: Soft, nontender, no hepatomegaly  Ext: No edema, pulses 2+  Musculoskeletal: No deformities, BUE and BLE strength normal and equal Skin: Warm and dry, no rashes   Neuro: Alert and oriented to person, place, time, and situation, CNII-XII grossly intact, no focal deficits  Psych: Normal mood and affect   Labs  High Sensitivity Troponin:  No results for input(s): "TROPONINIHS" in the last 720 hours.   Cardiac EnzymesNo results for input(s): "TROPONINI" in the last 168 hours. No results for input(s): "TROPIPOC" in the last 168 hours.  Chemistry Recent Labs  Lab 01/12/23 0358 01/14/23 0114 01/15/23 0007  NA 136 135 134*  K 3.5 3.0* 3.9  CL 103 101 101  CO2 24 26 27  $ GLUCOSE 118* 106* 122*  BUN 9 8 7*  CREATININE 0.60 0.57 0.57  CALCIUM 8.2* 8.1* 8.1*  PROT 5.1*  --   --   ALBUMIN 2.9*  --   --   AST 24  --   --   ALT 16  --   --   ALKPHOS 64  --   --   BILITOT 0.7  --   --   GFRNONAA >60 >60 >60  ANIONGAP 9 8 6    $ Hematology Recent Labs  Lab 01/12/23 0358  01/14/23 0114 01/15/23 0155  WBC 9.5 6.4 4.7  RBC 3.42* 2.86* 2.87*  HGB 11.1* 9.4* 9.3*  HCT 33.2* 28.0* 27.2*  MCV 97.1 97.9 94.8  MCH 32.5 32.9 32.4  MCHC 33.4 33.6 34.2  RDW 15.6* 15.2 14.9  PLT 95* 84* 102*   BNPNo results for input(s): "BNP", "PROBNP" in the last 168 hours.  DDimer No results for input(s): "DDIMER" in the last 168 hours.   Radiology  No results found.  Cardiac Studies  TTE 12/19/2022  1. Left ventricular ejection fraction, by estimation, is 50 to 55%. Left  ventricular ejection fraction by 3D volume is 51 %. The left ventricle has  low normal function. The left ventricle demonstrates regional wall motion  abnormalities (see scoring  diagram/findings for description). Left ventricular diastolic parameters  are consistent with Grade I diastolic dysfunction (impaired relaxation).  The average left ventricular global longitudinal strain is -19.6 %. The  global longitudinal strain is  normal.   2. Right ventricular systolic function is normal. The right ventricular  size is normal.   3. The mitral valve is normal in structure. No evidence of mitral valve  regurgitation. No evidence of mitral stenosis.   4. The aortic valve is tricuspid. There is mild calcification of the  aortic valve. Aortic valve regurgitation is not visualized. Aortic valve  sclerosis is present, with no evidence of aortic valve stenosis.   5. The inferior vena cava is normal in size with greater than 50%  respiratory variability, suggesting right atrial pressure of 3 mmHg.   Patient Profile  Ashley Robertson is a 67 y.o. female with breast cancer on HER2 therapy, hypertension, emphysema/tobacco abuse, coronary calcium, AAA who was admitted on 01/11/2023 for open AAA repair.  Course complicated by postoperative atrial fibrillation and cardiology was consulted.   Assessment & Plan   # Postoperative atrial fibrillation -Maintaining sinus rhythm.  Would continue 1 month of amiodarone  therapy.  400 mg twice daily for 7 days.  Then transition to 200 mg daily for 21 days and stop. -Would recommend at least 1 month of Eliquis post cardioversion.  5 mg twice daily is the correct dose.  She can discuss with her outpatient cardiologist if long-term anticoagulation is indicated. -Plan for discharge today.  We will  arrange for follow-up with Dr. Harl Bowie.  [place] will sign off.   Medication Recommendations: As above Other recommendations (labs, testing, etc): None Follow up as an outpatient: We will arrange outpatient follow-up in 2 to 4 weeks with Dr. Harl Bowie  For questions or updates, please contact [place] Please consult [email or web address] for contact info under        Signed, Lake Bells T. Audie Box, MD, [place]  01/15/2023 8:57 AM

## 2023-01-15 NOTE — TOC Transition Note (Addendum)
Transition of Care [facility]) - CM/SW Discharge Note   Patient Details  Name: Ashley Robertson MRN: [id number] Date of Birth: 18-Dec-1955  [facility] CM/SW Contact:  Erenest Rasher, RN Phone Number: [phone] 01/15/2023, 10:19 AM   Clinical Narrative:    Spoke to pt and husband will provide transportation home. Contacted [facility] to make of dc home today with HH. Pt has Eliquis copay card.    Final next level of care: Home w Home Health Services Barriers to Discharge: No Barriers Identified   Patient Goals and CMS Choice CMS Medicare.gov Compare Post Acute Care list provided to:: Patient Choice offered to / list presented to : Spouse  Discharge Placement                         Discharge Plan and Services Additional resources added to the After Visit Summary for     Discharge Planning Services: CM Consult Post Acute Care Choice: Home Health                    HH Arranged: RN, PT [facility] Agency: [place] Date [place]: 01/14/23 Time HH Agency Contacted: 1310 Representative spoke with at [place]: [place] ([place]) Interventions SDOH Screenings   Food Insecurity: No Food Insecurity (01/12/2023)  Housing: Low Risk  (01/12/2023)  Transportation Needs: No Transportation Needs (01/12/2023)  Utilities: Not At Risk (01/12/2023)  Financial Resource Strain: Low Risk  (02/21/2022)  Tobacco Use: Medium Risk (01/14/2023)     Readmission Risk Interventions     No data to display

## 2023-01-15 NOTE — Progress Notes (Signed)
  Progress Note    01/15/2023 7:50 AM 4 Days Post-Op  Subjective:  no BM but tolerating a regular diet   Vitals:   01/15/23 0600 01/15/23 0749  BP: (!) 161/77   Pulse: (!) 57   Resp: 17   Temp:  98.6 F (37 C)  SpO2: 96%    Physical Exam: Lungs:  non labored Incisions:  abd incision c/d/i Extremities:  palpable PT pulses BLE Abdomen:  sft, NT, ND Neurologic: A&O  CBC    Component Value Date/Time   WBC 4.7 01/15/2023 0155   RBC 2.87 (L) 01/15/2023 0155   HGB 9.3 (L) 01/15/2023 0155   HGB 13.7 12/06/2022 1226   HCT 27.2 (L) 01/15/2023 0155   PLT 102 (L) 01/15/2023 0155   PLT 178 12/06/2022 1226   MCV 94.8 01/15/2023 0155   MCH 32.4 01/15/2023 0155   MCHC 34.2 01/15/2023 0155   RDW 14.9 01/15/2023 0155   LYMPHSABS 0.9 12/06/2022 1226   MONOABS 0.6 12/06/2022 1226   EOSABS 0.1 12/06/2022 1226   BASOSABS 0.1 12/06/2022 1226    BMET    Component Value Date/Time   NA 134 (L) 01/15/2023 0007   NA 135 07/24/2021 0853   K 3.9 01/15/2023 0007   CL 101 01/15/2023 0007   CO2 27 01/15/2023 0007   GLUCOSE 122 (H) 01/15/2023 0007   BUN 7 (L) 01/15/2023 0007   BUN 12 07/24/2021 0853   CREATININE 0.57 01/15/2023 0007   CREATININE 0.62 12/06/2022 1226   CALCIUM 8.1 (L) 01/15/2023 0007   GFRNONAA >60 01/15/2023 0007   GFRNONAA >60 12/06/2022 1226   GFRAA 107 11/03/2020 1206    INR    Component Value Date/Time   INR 1.2 01/11/2023 1341     Intake/Output Summary (Last 24 hours) at 01/15/2023 0750 Last data filed at 01/15/2023 0500 Gross per 24 hour  Intake 994.78 ml  Output 0 ml  Net 994.78 ml     Assessment/Plan:  67 y.o. female is s/p open repair of AAA 4 Days Post-Op   BLE well perfused with palpable PT pulses Abd incision healing well Started on amio and Eliquis per Cardiology for atrial fibrillation Ok for discharge home today; office will arrange follow up in 2-3 weeks    Dagoberto Ligas, PA-C [facility] [phone] 01/15/2023 7:50 AM

## 2023-01-15 NOTE — Progress Notes (Signed)
PCA pump discontinued. Witnessed waste of 48m of PCA Morphine by this RN and Ryland [employer] RN 01/15/23 0140

## 2023-01-15 NOTE — Progress Notes (Signed)
Occupational Therapy Treatment Patient Details Name: Ashley Robertson MRN: [id number] DOB: 1956/09/28 Today's Date: 01/15/2023   History of present illness 67 yo F s/p Open repair of juxtarenal abdominal aortic aneurysm 6/9.  PMH includes: CA, COPD, HTN.   OT comments  Patient seen for follow up visit s/p eval.  Patient is at her baseline for ADL and in room mobility.  No deficits noted, and patient will have any assist needed at home from family.  No further acute needs, and no post acute OT recommended.     Recommendations for follow up therapy are one component of a multi-disciplinary discharge planning process, led by the attending physician.  Recommendations may be updated based on patient status, additional functional criteria and insurance authorization.    Follow Up Recommendations  No OT follow up     Assistance Recommended at Discharge PRN  Patient can return home with the following  Assist for transportation   Equipment Recommendations  None recommended by OT    Recommendations for Other Services      Precautions / Restrictions Precautions Precautions: None Restrictions Weight Bearing Restrictions: No       Mobility Bed Mobility Overal bed mobility: Independent                  Transfers Overall transfer level: Independent                       Balance Overall balance assessment: No apparent balance deficits (not formally assessed)                                         ADL either performed or assessed with clinical judgement   ADL Overall ADL's : At baseline                                            Extremity/Trunk Assessment Upper Extremity Assessment Upper Extremity Assessment: Overall WFL for tasks assessed   Lower Extremity Assessment Lower Extremity Assessment: Overall WFL for tasks assessed   Cervical / Trunk Assessment Cervical / Trunk Assessment: Normal    Vision Baseline  Vision/History: 1 Wears glasses Patient Visual Report: No change from baseline     Perception Perception Perception: Within Functional Limits   Praxis Praxis Praxis: Intact    Cognition Arousal/Alertness: Awake/alert Behavior During Therapy: WFL for tasks assessed/performed Overall Cognitive Status: Within Functional Limits for tasks assessed                                                             Pertinent Vitals/ Pain       Pain Assessment Pain Assessment: No/denies pain                                                          Frequency           Progress Toward Goals  OT  Goals(current goals can now be found in the care plan section)  Progress towards OT goals: Goals met/education completed, patient discharged from [facility] Discharge plan needs to be updated;All goals met and education completed, patient discharged from OT services    Co-evaluation                 AM-PAC OT "6 Clicks" Daily Activity     Outcome Measure   Help from another person eating meals?: None Help from another person taking care of personal grooming?: None Help from another person toileting, which includes using toliet, bedpan, or urinal?: None Help from another person bathing (including washing, rinsing, drying)?: None Help from another person to put on and taking off regular upper body clothing?: None Help from another person to put on and taking off regular lower body clothing?: None 6 Click Score: 24    End of Session    OT Visit Diagnosis: Unsteadiness on feet (R26.81)   Activity Tolerance Patient tolerated treatment well   Patient Left in chair;with call bell/phone within reach   Nurse Communication Mobility status        Time: 1025-1039 OT Time Calculation (min): 14 min  Charges: OT General Charges $OT Visit: 1 Visit OT Treatments $Self Care/Home Management : 8-22 mins  01/15/2023  RP,  OTR/L  Acute Rehabilitation Services  Office:  [phone]   Metta Clines 01/15/2023, 10:42 AM

## 2023-01-16 MED FILL — Sodium Chloride Irrigation Soln 0.9%: Qty: 3000 | Status: AC

## 2023-01-16 MED FILL — Sodium Chloride IV Soln 0.9%: INTRAVENOUS | Qty: 1000 | Status: AC

## 2023-01-16 MED FILL — Heparin Sodium (Porcine) Inj 1000 Unit/ML: INTRAMUSCULAR | Qty: 30 | Status: AC

## 2023-01-16 NOTE — Discharge Summary (Signed)
Aorta Discharge Summary    Ashley Robertson 1956/08/11 67 y.o. female  [id number]  Admission Date: 01/11/2023  Discharge Date: 01/15/23  Physician: Dr. Trula Slade  Admission Diagnosis: AAA (abdominal aortic aneurysm) [facility]) [I71.40] S/P AAA repair [id number], Z86.79]  Discharge Day services:    See progress note 01/15/23   Robertson Course:  Ashley Robertson is a 67 year old female who underwent open AAA repair with aorta by iliac bypass graft by Dr. Trula Slade on 01/11/2023 due to a juxtarenal 5.7 cm AAA.  She tolerated the procedure well and was admitted to the ICU postoperatively.  POD #1 abdomen was soft and bilateral lower extremities were well-perfused.  Patient did however go into atrial fibrillation and cardiology was consulted.  She was started on amiodarone infusion and is scheduled for cardioversion the following day.  Fortunately she converted to sinus rhythm and cardioversion was canceled.  She eventually was transition to p.o. amiodarone and was also started on Eliquis.  She will follow-up with her cardiologist in several weeks after discharge.  Over the course of her Robertson stay she maintained palpable PT pulses bilaterally.  Her abdomen incision appears to be healing well.  She tolerated and advancing diet.  She was also evaluated by therapy teams but no recommendation for home health was made.  She was prescribed 2 to 3 days of narcotic pain medication for continued postoperative pain control.  She will follow-up in our office in about 2 to 3 weeks for incision check.  She was discharged home in stable condition.   CBC    Component Value Date/Time   WBC 4.7 01/15/2023 0155   RBC 2.87 (L) 01/15/2023 0155   HGB 9.3 (L) 01/15/2023 0155   HGB 13.7 12/06/2022 1226   HCT 27.2 (L) 01/15/2023 0155   PLT 102 (L) 01/15/2023 0155   PLT 178 12/06/2022 1226   MCV 94.8 01/15/2023 0155   MCH 32.4 01/15/2023 0155   MCHC 34.2 01/15/2023 0155   RDW 14.9 01/15/2023 0155   LYMPHSABS 0.9  12/06/2022 1226   MONOABS 0.6 12/06/2022 1226   EOSABS 0.1 12/06/2022 1226   BASOSABS 0.1 12/06/2022 1226    BMET    Component Value Date/Time   NA 134 (L) 01/15/2023 0007   NA 135 07/24/2021 0853   K 3.9 01/15/2023 0007   CL 101 01/15/2023 0007   CO2 27 01/15/2023 0007   GLUCOSE 122 (H) 01/15/2023 0007   BUN 7 (L) 01/15/2023 0007   BUN 12 07/24/2021 0853   CREATININE 0.57 01/15/2023 0007   CREATININE 0.62 12/06/2022 1226   CALCIUM 8.1 (L) 01/15/2023 0007   GFRNONAA >60 01/15/2023 0007   GFRNONAA >60 12/06/2022 1226   GFRAA 107 11/03/2020 1206       Discharge Diagnosis:  AAA (abdominal aortic aneurysm) ([facility]) [I71.40] S/P AAA repair [id number], Z86.79]  Secondary Diagnosis: Patient Active Problem List   Diagnosis Date Noted   AAA (abdominal aortic aneurysm) (Ashley Robertson) 01/11/2023   S/P AAA repair 01/11/2023   Osteopenia 10/21/2022   Aortic atherosclerosis (Ashley Robertson) 08/10/2022   S/P mastectomy, right 07/26/2022   Carotid atherosclerosis 03/28/2022   Coronary artery disease 03/28/2022   Pulmonary emphysema ([place]) 03/28/2022   Recurrent major depression in remission (Ashley Robertson) 03/28/2022   Port-A-Cath in place 03/27/2022   Genetic testing 03/12/2022   Family history of breast cancer 02/21/2022   Family history of prostate cancer 02/21/2022   Hypercholesterolemia 02/21/2022   Malignant neoplasm of overlapping sites of right breast in female, estrogen receptor positive (Ashley Robertson) 02/16/2022  Bilateral carotid bruits 04/26/2020   Essential hypertension 03/19/2020   Hyperlipidemia with target LDL less than 100 03/19/2020   Prediabetes 03/19/2020   Agatston CAC score, <100 03/15/2020   Past Medical History:  Diagnosis Date   Allergy    Lisinopril and lipitor   Anxiety and depression    Aortic atherosclerosis ([place]) 03/2020   CT Chest: 2 V (LAD & LCx) Coronary Atherosclerosis, Aortic Atherosclerosis (no aneurysm).  Mild centrilobular emphysema with mild diffuse bronchial thickening;  several scattered small solitary pulmonary nodules (largest 5.6 mm in anterior left upper lobe)   Breast cancer (Ashley Robertson) 12/2021   Right breast ILC   Carotid artery plaque, bilateral 10/2015   Mild to moderate plaque L>R without significant stenosis   COPD (chronic obstructive pulmonary disease) ([facility])    Coronary Artery Calcification - Score 79    Coronary Calcium Score 79.  LAD and circumflex calcification noted.  Normal ascending aorta with mild calcification.   Current every day smoker    pt quit smoking April 2023   Emphysema lung Ashley Robertson)    Noted on chest CT   Family history of breast cancer 02/21/2022   Family history of prostate cancer 02/21/2022   Hyperlipidemia    Hypertension    Controlled with amlodipine   Prediabetes    Skin cancer 2021   Remove 2022     Allergies as of 01/15/2023       Reactions   Lisinopril Other (See Comments), Cough   High potassium   Lipitor [atorvastatin] Other (See Comments)   Myalgias        Medication List     TAKE these medications    albuterol 108 (90 Base) MCG/ACT inhaler Commonly known as: Proventil HFA Inhale 1 to 2 puffs into the lungs every 6 hours as needed   amiodarone 400 MG tablet Commonly known as: PACERONE Take 1 tablet (400 mg total) by mouth 2 (two) times daily for 7 days.   amiodarone 200 MG tablet Commonly known as: PACERONE Take 1 tablet (200 mg total) by mouth daily for 21 days. Start taking on: January 21, 2023   amLODipine 10 MG tablet Commonly known as: NORVASC Take 1 tablet (10 mg total) by mouth at bedtime.   anastrozole 1 MG tablet Commonly known as: ARIMIDEX Take 1 tablet (1 mg total) by mouth daily.   aspirin 81 MG tablet Take 81 mg by mouth at bedtime.   CALCIUM 500 + D3 PO Take 1 tablet by mouth daily.   carvedilol 6.25 MG tablet Commonly known as: COREG Take 1 tablet (6.25 mg total) by mouth 2 (two) times daily.   Eliquis 5 MG Tabs tablet Generic drug: apixaban Take 1 tablet (5 mg  total) by mouth 2 (two) times daily.   FLUoxetine 20 MG capsule Commonly known as: PROZAC Take 1 capsule (20 mg total) by mouth daily.   ibuprofen 200 MG tablet Commonly known as: ADVIL Take 600 mg by mouth every 6 (six) hours as needed for moderate pain.   magnesium (amino acid chelate) 133 MG tablet Take 1 tablet by mouth at bedtime.   OVER THE COUNTER MEDICATION Take 1 capsule by mouth daily. Serrapeptase 120,000 units daily  Reported on 12/27/2015   oxyCODONE-acetaminophen 5-325 MG tablet Commonly known as: PERCOCET/ROXICET Take 1 tablet by mouth every 6 (six) hours as needed for moderate pain.   rosuvastatin 5 MG tablet Commonly known as: CRESTOR Take 1 tablet by mouth daily at bedtime ,except on Monday, Wednesday and Fridays  take 2 tablets at bedtime as directed   Trelegy Ellipta 200-62.5-25 MCG/ACT Aepb Generic drug: Fluticasone-Umeclidin-Vilant Inhale 1 puff into the lungs daily.   Vitamin D 50 MCG (2000 UT) Caps Take 1 capsule (2,000 Units total) by mouth daily.        Instructions:  [facility] Discharge Instructions Open Aortic Surgery  Please refer to the following instructions for your post-procedure care. Your surgeon or Physician Assistant will discuss any changes with you.  Activity  Avoid lifting more than eight pounds (a gallon of milk) until after your first post-operative visit. You are encouraged to walk as much as you can. You can slowly return to normal activities but must avoid strenuous activity and heavy lifting until your doctor tells you it's OK. Heavy lifting can hurt the incision and cause a hernia. Avoid activities such as vacuuming or swinging a golf club. It is normal to feel tired for several weeks after your surgery. Do not drive until your doctor gives the OK and you are no longer taking prescription pain medications. It is also normal to have difficulty with sleep habits, eating and bowl movements after  surgery. These will go away with time.  Bathing/Showering  You may shower after you go home. Do not soak in a bathtub, hot tub, or swim until the incision heals.  Incision Care  Shower every day. Clean your incision with mild soap and water. Pat the area dry with a clean towel. You do not need a bandage unless otherwise instructed. Do not apply any ointments or creams to your incision. You may have skin glue on your incision. Do not peel it off. It will come off on its own in about one week. If you have staples or sutures along your incision, they will be removed at your post op appointment.  If you have groin incisions, wash the groin wounds with soap and water daily and pat dry. (No tub bath-only shower)  Then put a dry gauze or washcloth in the groin to keep this area dry to help prevent wound infection.  Do this daily and as needed.  Do not use Vaseline or neosporin on your incisions.  Only use soap and water on your incisions and then protect and keep dry.  Diet  Resume your normal diet. There are no special food restriction following this procedure. A low fat/low cholesterol diet is recommended for all patients with vascular disease. After your aortic surgery, it's normal to feel full faster than usual and to not feel as hungry as you normally would. You will probably lose weight initially following your surgery. It's best to eat small, frequent meals over the course of the day. Call the office if you find that you are unable to eat even small meals. In order to heal from your surgery, it is CRITICAL to get adequate nutrition. Your body requires vitamins, minerals, and protein. Vegetables are the best source of vitamins and minerals. causing pain, you may take over-the-counter pain reliever such as acetaminophen (Tylenol). If you were prescribed a stronger pain medication, please be aware these medication can cause nausea and constipation. Prevent nausea by taking the medication with a snack or  meal. Avoid constipation by drinking plenty of fluids and eating foods with a high amount of fiber, such as fruits, vegetables and grains. Take 184m of the over-the-counter stool softener Colace twice a day as needed to help with constipation. A laxative, such as Milk of Magnesia, may be recommended  for you at this time. Do not take a laxative unless your surgeon or Physician Assistant. tells you it's OK. Do not take Tylenol if you are taking stronger pain medications (such as Percocet).  Follow Up  Our office will schedule a follow up appointment 2-3 weeks after discharge.  Please call us immediately for any of the following conditions    .     Severe or worsening pain in your legs or feet or in your abdomen back or chest. Increased pain, redness drainage (pus) from your incision site. Increased abdominal pain, bloating, nausea, vomiting, or persistent diarrhea. Fever of 101 degrees or higher. Swelling in your leg (s).  Reduce your risk of vascular disease  Stop smoking. If you would like help, call [facility] at 1-800-QUIT-NOW 312-687-1331) or [place] at (608)502-7001. Manage your cholesterol Maintain a desired weight Control your diabetes Keep your blood pressure down  If you have any questions please call the office at [phone].   Disposition: home  Patient's condition: is Good  Follow up: 1. Dr. VVS in 3 weeks   Dagoberto Ligas, PA-C [facility] [phone] 01/16/2023  8:47 AM   - For VQI Registry use -  Post-op:  Time to Extubation: [x]$  In OR, [ ]$  < 12 hrs, [ ]$  12-24 hrs, [ ]$  >=24 hrs Vasopressors Req. Post-op: No ICU Stay: 4 day in ICU Transfusion: No   MI: No, [ ]$  Troponin only, [ ]$  EKG or Clinical New Arrhythmia: Yes  Complications: CHF: No Resp failure: No, [ ]$  Pneumonia, [ ]$  Ventilator Chg in renal function: No, [ ]$  Inc. Cr > 0.5, [ ]$  Temp. Dialysis, [ ]$  Permanent dialysis Leg ischemia: No, no Surgery needed, [ ]$  Yes,  Surgery needed, [ ]$  Amputation Bowel ischemia: No, [ ]$  Medical Rx, [ ]$  Surgical Rx Wound complication: No, [ ]$  Superficial separation/infection, [ ]$  Return to OR Return to OR: No  Return to OR for bleeding: No Stroke: No, [ ]$  Minor, [ ]$  Major  Discharge medications: Statin use:  Yes  ASA use:  Yes   Plavix use:  No  Beta blocker use:  Yes  ACEI use:  No ARB use:  No CCB use:  Yes Coumadin use:  Yes, Eliquis

## 2023-01-16 NOTE — Progress Notes (Unsigned)
[place]   [phone]) [phone] [phone]) [phone]   Clinic Follow up Note   Patient Care Team: Cari Caraway, MD as PCP - General (Family Medicine) Leonie Man, MD as PCP - Cardiology (Cardiology) Mauro Kaufmann, RN as Oncology Nurse Navigator Rockwell Germany, RN as Oncology Nurse Navigator Rolm Bookbinder, MD as Consulting Physician (General Surgery) Truitt Merle, MD as Consulting Physician (Hematology) Kyung Rudd, MD as Consulting Physician (Radiation Oncology)  Date of Service:  01/17/2023  CHIEF COMPLAINT: f/u of right breast cancer     CURRENT THERAPY:  Kadcyla, q21d, starting 08/24/22  -post-mastectomy radiation, 09/11/22 - 10/24/22  ASSESSMENT:  Ashley Robertson is a 67 y.o. female with   Malignant neoplasm of overlapping sites of right breast in female, estrogen receptor positive ([place])  invasive lobular carcinoma, Stage IIIA, c(T3, N1), yp(T3, N1a), ER+/PR-/HER2+, Grade 2  -Diagnosed in 01/2022 -s/p neoadjuvant chemo TCHP X6, followed by right mastectomy by Dr. Donne Hazel and reconstruction by Dr. Iran Planas. Unfortunately she did not have much response to neoadjvuant chemo  -on adjuvant Kadcyla now, tolerating well with no noticeable side effects. Plan for one year treatment, through 08/2023. -she is also receiving postmastectomy radiation under Dr. Lisbeth Renshaw, 10/10 - 10/29/22. -she started adjuvant anastrozole in January 2024 -Echocardiogram in January 2024 showed slightly decreased EF 50 to 55%.  She was seen by cardiologist after echo, her cardiac medication was adjusted, and we will continue HER2 antibody.  Next repeated echo in April 2024.  Osteopenia -Her most recent DEXA was on 04/29/20 showing osteopenia (T-score of -2.1 at L1-3). -continue calcium and vitD  -We discussed the negative impact on bone density from aromatase inhibitor -I recommend her to consider Zometa infusion every 6 months for 2 years, benefit and side effect discussed with  her. She will think about it   AAA -s/p repair on 2/9. She is recovering well.  Her infection has healed well.   PLAN: -lab reviewed -proceed with C7 KADCYLA today -lab/flush, f/u and Kadcyla 02/07/2023  SUMMARY OF ONCOLOGIC HISTORY: Oncology History Overview Note   Cancer Staging  Malignant neoplasm of overlapping sites of right breast in female, estrogen receptor positive ([place]) Staging form: Breast, AJCC 8th Edition - Clinical stage from 02/09/2022: Stage IIA (cT2, cN1, cM0, G2, ER+, PR-, HER2+) - Signed by Truitt Merle, MD on 02/20/2022    Malignant neoplasm of overlapping sites of right breast in female, estrogen receptor positive ([place])  02/08/2022 Mammogram   CLINICAL DATA:  Acute onset right breast lump.  EXAM: DIGITAL DIAGNOSTIC UNILATERAL RIGHT MAMMOGRAM WITH TOMOSYNTHESIS AND CAD; ULTRASOUND RIGHT BREAST LIMITED  IMPRESSION: 1. Highly suspicious findings mammographically and sonographically. I suspect there is significant malignancy throughout most of the right breast. Discrete masses are seen at 11 o'clock and 6 o'clock.  A single borderline lymph node is identified. This lymph node appears prominent compared to the remainder of the lymph nodes. The skin thickening suggests the possibility of inflammatory breast cancer.   02/09/2022 Cancer Staging   Staging form: Breast, AJCC 8th Edition - Clinical stage from 02/09/2022: Stage IIIA (cT3, cN1, cM0, G2, ER+, PR-, HER2+) - Signed by Truitt Merle, MD on 03/05/2022 Stage prefix: Initial diagnosis Histologic grading system: 3 grade system   02/09/2022 Initial Biopsy   Diagnosis 1. Breast, right, needle core biopsy, right breast 11 o'clock mass ribbon clip - INVASIVE MAMMARY CARCINOMA - SEE COMMENT 2. Breast, right, needle core biopsy, right breast 6'oclock mass, coil clip - INVASIVE MAMMARY CARCINOMA - SEE  COMMENT 3. Lymph node, needle/core biopsy, right axillary lymph node, tribell clip - METASTATIC CARCINOMA INVOLVING NODAL  TISSUE Microscopic Comment 1. and 2. The biopsy material shows an infiltrative proliferation of cells arranged linearly and in small clusters. Based on the biopsy, the carcinoma appears Nottingham grade 2 of 3 and measures 1.5 cm in greatest linear extent.  Addendum parts 1 and 2: Immunohistochemistry for E-cadherin is negative consistent with lobular carcinoma.  2. PROGNOSTIC INDICATORS Results: The tumor cells are EQUIVOCAL for Her2 (2+). Her2 by FISH will be performed and the results reported separately. Estrogen Receptor: 100%, POSITIVE, STRONG STAINING INTENSITY Progesterone Receptor: <1%, NEGATIVE Proliferation Marker Ki67: 10%  2. FLUORESCENCE IN-SITU HYBRIDIZATION Results: GROUP 1: HER2 **POSITIVE**   3. PROGNOSTIC INDICATORS Results: By immunohistochemistry, the tumor cells are POSITIVE for Her2 (3+). Estrogen Receptor: 100%, POSITIVE, STRONG STAINING INTENSITY Progesterone Receptor: <1%, NEGATIVE   02/16/2022 Initial Diagnosis   Malignant neoplasm of overlapping sites of right breast in female, estrogen receptor positive ([place])   03/01/2022 Imaging   EXAM: BILATERAL BREAST MRI WITH AND WITHOUT CONTRAST  IMPRESSION: 1. 8.5 x 8.3 x 5.9 cm area of confluent mass-like enhancement in the right breast involving all 4 quadrants and containing 2 biopsy marker clip artifacts, compatible with 4 quadrant biopsy-proven malignancy. 2. Biopsy-proven metastatic lymph node in the right axilla. 3. Possible metastatic intramammary lymph node in the posterior outer right breast just above the level of the nipple. 4. No evidence of malignancy on the left.   03/01/2022 Genetic Testing   Negative hereditary cancer genetic testing: no pathogenic variants detected in [facility] CustomNext-Cancer +RNAinsight Panel.  Variant of uncertain significance reported in BRIP1 at p.F934V (c.2800T>G). Report date is March 01, 2022.    The CustomNext-Cancer+RNAinsight panel offered by [employer] includes  sequencing and rearrangement analysis for the following 47 genes:  APC, ATM, AXIN2, BARD1, BMPR1A, BRCA1, BRCA2, BRIP1, CDH1, CDK4, CDKN2A, CHEK2, DICER1, EPCAM, GREM1, HOXB13, MEN1, MLH1, MSH2, MSH3, MSH6, MUTYH, NBN, NF1, NF2, NTHL1, PALB2, PMS2, POLD1, POLE, PTEN, RAD51C, RAD51D, RECQL, RET, SDHA, SDHAF2, SDHB, SDHC, SDHD, SMAD4, SMARCA4, STK11, TP53, TSC1, TSC2, and VHL.  RNA data is routinely analyzed for use in variant interpretation for all genes.   03/05/2022 Imaging   EXAM: CT CHEST, ABDOMEN, AND PELVIS WITH CONTRAST  IMPRESSION: 1. Asymmetric, masslike density of the glandular tissue of the right breast with overlying skin thickening, in keeping with known primary breast malignancy. 2. Enlarged, ill-defined right axillary lymph node containing a biopsy marking clip, consistent with known nodal metastatic disease. 3. No other evidence of lymphadenopathy or metastatic disease in the chest, abdomen, or pelvis. 4. Emphysema. Background of fine centrilobular nodularity, most concentrated in the lung apices, consistent with smoking-related respiratory bronchiolitis. 5. Aneurysm of the infrarenal abdominal aorta measuring up to 5.4 x 5.3 cm with a large burden of eccentric mural thrombus. Recommend follow-up CT/MR every 6 months and vascular consultation. This recommendation follows ACR consensus guidelines: White Paper of the ACR Incidental Findings Committee II on Vascular Findings. J Am Coll Radiol 2013; 10:789-794. 6. Coronary artery disease.   03/05/2022 Imaging   EXAM: NUCLEAR MEDICINE WHOLE BODY BONE SCAN  IMPRESSION: No evidence of bony metastatic disease.   03/07/2022 - 08/01/2022 Chemotherapy   Patient is on Treatment Plan : BREAST  Docetaxel + Carboplatin + Trastuzumab + Pertuzumab  (TCHP) q21d      07/26/2022 Surgery   Right mastectomy with axillary node seed guided excision converted to axillary node dissection with reconstruction by Drs.  Donne Hazel and Thimmappa   07/26/2022  Pathology Results   FINAL MICROSCOPIC DIAGNOSIS:   A. LYMPH NODE, RIGHT AXILLARY TARGETED, EXCISION:  - Metastatic carcinoma involving one lymph node, 2 cm (1/1).  - Focal extranodal extension.  - Biopsy site and biopsy clip.   B. BREAST, RIGHT, MASTECTOMY:  - Invasive lobular carcinoma, 9.5 cm (ypT3).  - Carcinoma involves dermis of nipple.  - All surgical margins negative for carcinoma.  - One lymph node negative for metastatic carcinoma (0/1).  - Biopsy sites and biopsy clips.  - See oncology table.   C. LYMPH NODE, RIGHT AXILLARY, SENTINEL, EXCISION:  - One lymph node negative for metastatic carcinoma (0/1).   D. LYMPH NODE, RIGHT AXILLARY, SENTINEL, EXCISION:  - Metastatic carcinoma in one lymph node, 0.5 cm. (1/1).   E. LYMPH NODE, RIGHT AXILLARY, SENTINEL, EXCISION:  - One lymph node negative for metastatic carcinoma (0/1).    07/26/2022 Cancer Staging   Cancer Staging  Malignant neoplasm of overlapping sites of right breast in female, estrogen receptor positive ([place]) Staging form: Breast, AJCC 8th Edition - Clinical stage from 02/09/2022: Stage IIIA (cT3, cN1, cM0, G2, ER+, PR-, HER2+) - Signed by Truitt Merle, MD on 03/05/2022 Stage prefix: Initial diagnosis Histologic grading system: 3 grade system - Pathologic stage from 07/26/2022: Stage IIIA (pT3, pN1a, cM0, G2, ER+, PR-, HER2+) - Signed by Alla Feeling, NP on 08/01/2022 Histologic grading system: 3 grade system    07/26/2022 Cancer Staging   Staging form: Breast, AJCC 8th Edition - Pathologic stage from 07/26/2022: Stage IIIA (pT3, pN1a, cM0, G2, ER+, PR-, HER2+) - Signed by Alla Feeling, NP on 08/01/2022 Histologic grading system: 3 grade system   08/24/2022 -  Chemotherapy   Patient is on Treatment Plan : BREAST ADO-Trastuzumab Emtansine (Kadcyla) q21d        INTERVAL HISTORY:  Ashley Robertson is here for a follow up of  right breast cancer   She was last seen by me on 12/06/2022 She presents to the clinic  alone. Pt state she is doing well.  Pt had surgery last Friday. Pt is going to continue the Kadcyla. Pt is on Eliquis. For 1 month.Pt was able to stay in the hospital for a couple days. Pt stated she recovered well.     All other systems were reviewed with the patient and are negative.  MEDICAL HISTORY:  Past Medical History:  Diagnosis Date   Allergy    Lisinopril and lipitor   Anxiety and depression    Aortic atherosclerosis ([place]) 03/2020   CT Chest: 2 V (LAD & LCx) Coronary Atherosclerosis, Aortic Atherosclerosis (no aneurysm).  Mild centrilobular emphysema with mild diffuse bronchial thickening; several scattered small solitary pulmonary nodules (largest 5.6 mm in anterior left upper lobe)   Breast cancer ([place]) 12/2021   Right breast ILC   Carotid artery plaque, bilateral 10/2015   Mild to moderate plaque L>R without significant stenosis   COPD (chronic obstructive pulmonary disease) ([facility])    Coronary Artery Calcification - Score 79    Coronary Calcium Score 79.  LAD and circumflex calcification noted.  Normal ascending aorta with mild calcification.   Current every day smoker    pt quit smoking April 2023   Emphysema lung [facility])    Noted on chest CT   Family history of breast cancer 02/21/2022   Family history of prostate cancer 02/21/2022   Hyperlipidemia    Hypertension    Controlled with amlodipine   Prediabetes  Skin cancer 2021   Remove 2022    SURGICAL HISTORY: Past Surgical History:  Procedure Laterality Date   ABDOMINAL AORTIC ANEURYSM REPAIR N/A 01/11/2023   Procedure: ANEURYSM ABDOMINAL AORTIC REPAIR;  Surgeon: Serafina Mitchell, MD;  Location: [facility];  Service: Vascular;  Laterality: N/A;   BREAST BIOPSY Right    times 3   BREAST RECONSTRUCTION WITH PLACEMENT OF TISSUE EXPANDER AND ALLODERM Right 07/26/2022   Procedure: RIGHT BREAST RECONSTRUCTION WITH PLACEMENT OF TISSUE EXPANDER AND ALLODERM;  Surgeon: Irene Limbo, MD;  Location: [facility];  Service:  Plastics;  Laterality: Right;   [place]   Rhinoplasty   MASTECTOMY Right    MASTECTOMY W/ SENTINEL NODE BIOPSY Right 07/26/2022   Procedure: RIGHT MASTECTOMY, RIGHT AXILLARY SENTINEL NODE BIOPSY;  Surgeon: Rolm Bookbinder, MD;  Location: [facility];  Service: General;  Laterality: Right;  GEN & PEC BLOCK   PORTACATH PLACEMENT Left 03/06/2022   Procedure: INSERTION PORT-A-CATH;  Surgeon: Rolm Bookbinder, MD;  Location: [facility];  Service: General;  Laterality: Left;   RADIOACTIVE SEED GUIDED AXILLARY SENTINEL LYMPH NODE Right 07/26/2022   Procedure: RADIOACTIVE SEED GUIDED AXILLARY SENTINEL LYMPH NODE DISSECTION;  Surgeon: Rolm Bookbinder, MD;  Location: [facility];  Service: General;  Laterality: Right;   [place] ECHOCARDIOGRAM  10/2016   EF 55 to 60%.  Normal systolic and diastolic function.  No ASD/PFO   TUBAL LIGATION  1991    I have reviewed the social history and family history with the patient and they are unchanged from previous note.  ALLERGIES:  is allergic to lisinopril and lipitor [atorvastatin].  MEDICATIONS:  Current Outpatient Medications  Medication Sig Dispense Refill   albuterol (PROVENTIL HFA) 108 (90 Base) MCG/ACT inhaler Inhale 1 to 2 puffs into the lungs every 6 hours as needed 8.5 g 2   [START ON 01/21/2023] amiodarone (PACERONE) 200 MG tablet Take 1 tablet (200 mg total) by mouth daily for 21 days. 21 tablet 0   amiodarone (PACERONE) 400 MG tablet Take 1 tablet (400 mg total) by mouth 2 (two) times daily for 7 days. 14 tablet 0   amLODipine (NORVASC) 10 MG tablet Take 1 tablet (10 mg total) by mouth at bedtime. 90 tablet 3   anastrozole (ARIMIDEX) 1 MG tablet Take 1 tablet (1 mg total) by mouth daily. 30 tablet 2   apixaban (ELIQUIS) 5 MG TABS tablet Take 1 tablet (5 mg total) by mouth 2 (two) times daily. 60 tablet 1   aspirin 81 MG tablet Take 81 mg by  mouth at bedtime.     Calcium Carb-Cholecalciferol (CALCIUM 500 + D3 PO) Take 1 tablet by mouth daily.     carvedilol (COREG) 6.25 MG tablet Take 1 tablet (6.25 mg total) by mouth 2 (two) times daily. 60 tablet 3   Cholecalciferol (VITAMIN D) 50 MCG (2000 UT) CAPS Take 1 capsule (2,000 Units total) by mouth daily. 30 capsule 2   FLUoxetine (PROZAC) 20 MG capsule Take 1 capsule (20 mg total) by mouth daily. 90 capsule 1   Fluticasone-Umeclidin-Vilant (TRELEGY ELLIPTA) 200-62.5-25 MCG/ACT AEPB Inhale 1 puff into the lungs daily. 60 each 2   ibuprofen (ADVIL) 200 MG tablet Take 600 mg by mouth every 6 (six) hours as needed for moderate pain.     OVER THE COUNTER MEDICATION Take 1 capsule by mouth daily. Serrapeptase 120,000 units daily  Reported  on 12/27/2015     oxyCODONE-acetaminophen (PERCOCET/ROXICET) 5-325 MG tablet Take 1 tablet by mouth every 6 (six) hours as needed for moderate pain. 20 tablet 0   rosuvastatin (CRESTOR) 5 MG tablet Take 1 tablet by mouth daily at bedtime ,except on Monday, Wednesday and Fridays take 2 tablets at bedtime as directed 180 tablet 3   Specialty Vitamins Products (MAGNESIUM, AMINO ACID CHELATE,) 133 MG tablet Take 1 tablet by mouth at bedtime.     No current facility-administered medications for this visit.    PHYSICAL EXAMINATION: ECOG PERFORMANCE STATUS: 1 - Symptomatic but completely ambulatory  Vitals:   01/17/23 0910  BP: 117/78  Pulse: (!) 58  Resp: 17  SpO2: 99%   Wt Readings from Last 3 Encounters:  01/17/23 159 lb (72.1 kg)  01/11/23 160 lb (72.6 kg)  01/04/23 161 lb (73 kg)     GENERAL:alert, no distress and comfortable SKIN: skin color normal, no rashes or significant lesions EYES: normal, Conjunctiva are pink and non-injected, sclera clear  NEURO: alert & oriented x 3 with fluent speech LUNGS: (-) clear to auscultation and percussion with normal breathing effort HEART: (-) regular rate & rhythm and no murmurs and no lower extremity  edema   LABORATORY DATA:  I have reviewed the data as listed    Latest Ref Rng & Units 01/17/2023    8:53 AM 01/15/2023    1:55 AM 01/14/2023    1:14 AM  CBC  WBC 4.0 - 10.5 K/uL 4.9  4.7  6.4   Hemoglobin 12.0 - 15.0 g/dL 9.6  9.3  9.4   Hematocrit 36.0 - 46.0 % 28.6  27.2  28.0   Platelets 150 - 400 K/uL 190  102  84         Latest Ref Rng & Units 01/17/2023    8:53 AM 01/15/2023   12:07 AM 01/14/2023    1:14 AM  CMP  Glucose 70 - 99 mg/dL 119  122  106   BUN 8 - 23 mg/dL 13  7  8   $ Creatinine 0.44 - 1.00 mg/dL 0.69  0.57  0.57   Sodium 135 - 145 mmol/L 136  134  135   Potassium 3.5 - 5.1 mmol/L 3.4  3.9  3.0   Chloride 98 - 111 mmol/L 98  101  101   CO2 22 - 32 mmol/L 28  27  26   $ Calcium 8.9 - 10.3 mg/dL 8.3  8.1  8.1   Total Protein 6.5 - 8.1 g/dL 6.6     Total Bilirubin 0.3 - 1.2 mg/dL 0.9     Alkaline Phos 38 - 126 U/L 81     AST 15 - 41 U/L 18     ALT 0 - 44 U/L 14         RADIOGRAPHIC STUDIES: I have personally reviewed the radiological images as listed and agreed with the findings in the report. No results found.    Orders Placed This Encounter  Procedures   CBC with Differential ([facility] Only)    Standing Status:   Future    Standing Expiration Date:   03/21/2024   CMP ([facility] only)    Standing Status:   Future    Standing Expiration Date:   03/21/2024   All questions were answered. The patient knows to call the clinic with any problems, questions or concerns. No barriers to learning was detected. The total time spent in the appointment was 30 minutes.  Truitt Merle, MD 01/17/2023   Felicity Coyer, CMA, am acting as scribe for Truitt Merle, MD.   I have reviewed the above documentation for accuracy and completeness, and I agree with the above.

## 2023-01-17 ENCOUNTER — Encounter: Payer: Self-pay | Admitting: Hematology

## 2023-01-17 ENCOUNTER — Inpatient Hospital Stay (HOSPITAL_BASED_OUTPATIENT_CLINIC_OR_DEPARTMENT_OTHER): Payer: PPO | Admitting: Hematology

## 2023-01-17 ENCOUNTER — Inpatient Hospital Stay: Payer: PPO | Attending: Hematology

## 2023-01-17 ENCOUNTER — Other Ambulatory Visit: Payer: Self-pay

## 2023-01-17 ENCOUNTER — Inpatient Hospital Stay: Payer: PPO

## 2023-01-17 VITALS — BP 136/75 | HR 92 | Temp 98.2°F | Resp 16

## 2023-01-17 VITALS — BP 117/78 | HR 58 | Resp 17 | Ht 65.0 in | Wt 159.0 lb

## 2023-01-17 DIAGNOSIS — Z5112 Encounter for antineoplastic immunotherapy: Secondary | ICD-10-CM | POA: Diagnosis not present

## 2023-01-17 DIAGNOSIS — C50811 Malignant neoplasm of overlapping sites of right female breast: Secondary | ICD-10-CM | POA: Insufficient documentation

## 2023-01-17 DIAGNOSIS — Z79899 Other long term (current) drug therapy: Secondary | ICD-10-CM | POA: Diagnosis not present

## 2023-01-17 DIAGNOSIS — M8588 Other specified disorders of bone density and structure, other site: Secondary | ICD-10-CM | POA: Diagnosis not present

## 2023-01-17 DIAGNOSIS — C773 Secondary and unspecified malignant neoplasm of axilla and upper limb lymph nodes: Secondary | ICD-10-CM | POA: Insufficient documentation

## 2023-01-17 DIAGNOSIS — Z7982 Long term (current) use of aspirin: Secondary | ICD-10-CM | POA: Diagnosis not present

## 2023-01-17 DIAGNOSIS — Z7901 Long term (current) use of anticoagulants: Secondary | ICD-10-CM | POA: Diagnosis not present

## 2023-01-17 DIAGNOSIS — Z79811 Long term (current) use of aromatase inhibitors: Secondary | ICD-10-CM | POA: Insufficient documentation

## 2023-01-17 DIAGNOSIS — Z170 Estrogen receptor positive status [ER+]: Secondary | ICD-10-CM | POA: Insufficient documentation

## 2023-01-17 DIAGNOSIS — Z9011 Acquired absence of right breast and nipple: Secondary | ICD-10-CM | POA: Diagnosis not present

## 2023-01-17 DIAGNOSIS — Z95828 Presence of other vascular implants and grafts: Secondary | ICD-10-CM

## 2023-01-17 LAB — BPAM RBC
Blood Product Expiration Date: 202403022359
Blood Product Expiration Date: 202403032359
Blood Product Expiration Date: 202403032359
Blood Product Expiration Date: 202403042359
Blood Product Expiration Date: 202403042359
Blood Product Expiration Date: 202403042359
ISSUE DATE / TIME: 202402090811
ISSUE DATE / TIME: 202402090811
ISSUE DATE / TIME: 202402121002
ISSUE DATE / TIME: 202402141216
Unit Type and Rh: 6200
Unit Type and Rh: 6200
Unit Type and Rh: 6200
Unit Type and Rh: 6200
Unit Type and Rh: 6200
Unit Type and Rh: 6200

## 2023-01-17 LAB — CBC WITH DIFFERENTIAL (CANCER CENTER ONLY)
Abs Immature Granulocytes: 0.04 10*3/uL (ref 0.00–0.07)
Basophils Absolute: 0 10*3/uL (ref 0.0–0.1)
Basophils Relative: 1 %
Eosinophils Absolute: 0.2 10*3/uL (ref 0.0–0.5)
Eosinophils Relative: 4 %
HCT: 28.6 % — ABNORMAL LOW (ref 36.0–46.0)
Hemoglobin: 9.6 g/dL — ABNORMAL LOW (ref 12.0–15.0)
Immature Granulocytes: 1 %
Lymphocytes Relative: 15 %
Lymphs Abs: 0.8 10*3/uL (ref 0.7–4.0)
MCH: 31.8 pg (ref 26.0–34.0)
MCHC: 33.6 g/dL (ref 30.0–36.0)
MCV: 94.7 fL (ref 80.0–100.0)
Monocytes Absolute: 0.7 10*3/uL (ref 0.1–1.0)
Monocytes Relative: 13 %
Neutro Abs: 3.2 10*3/uL (ref 1.7–7.7)
Neutrophils Relative %: 66 %
Platelet Count: 190 10*3/uL (ref 150–400)
RBC: 3.02 MIL/uL — ABNORMAL LOW (ref 3.87–5.11)
RDW: 14.9 % (ref 11.5–15.5)
WBC Count: 4.9 10*3/uL (ref 4.0–10.5)
nRBC: 0 % (ref 0.0–0.2)

## 2023-01-17 LAB — TYPE AND SCREEN
ABO/RH(D): A POS
Antibody Screen: NEGATIVE
Unit division: 0
Unit division: 0
Unit division: 0
Unit division: 0
Unit division: 0
Unit division: 0

## 2023-01-17 LAB — CMP (CANCER CENTER ONLY)
ALT: 14 U/L (ref 0–44)
AST: 18 U/L (ref 15–41)
Albumin: 2.9 g/dL — ABNORMAL LOW (ref 3.5–5.0)
Alkaline Phosphatase: 81 U/L (ref 38–126)
Anion gap: 10 (ref 5–15)
BUN: 13 mg/dL (ref 8–23)
CO2: 28 mmol/L (ref 22–32)
Calcium: 8.3 mg/dL — ABNORMAL LOW (ref 8.9–10.3)
Chloride: 98 mmol/L (ref 98–111)
Creatinine: 0.69 mg/dL (ref 0.44–1.00)
GFR, Estimated: >60 mL/min (ref >60)
Glucose, Bld: 119 mg/dL — ABNORMAL HIGH (ref 70–99)
Potassium: 3.4 mmol/L — ABNORMAL LOW (ref 3.5–5.1)
Sodium: 136 mmol/L (ref 135–145)
Total Bilirubin: 0.9 mg/dL (ref 0.3–1.2)
Total Protein: 6.6 g/dL (ref 6.5–8.1)

## 2023-01-17 MED ORDER — CETIRIZINE HCL 10 MG/ML IV SOLN
10.0000 mg | Freq: Once | INTRAVENOUS | Status: AC
  Administered 2023-01-17: 10 mg via INTRAVENOUS
  Filled 2023-01-17: qty 1

## 2023-01-17 MED ORDER — PROCHLORPERAZINE MALEATE 10 MG PO TABS
10.0000 mg | ORAL_TABLET | Freq: Once | ORAL | Status: AC
  Administered 2023-01-17: 10 mg via ORAL
  Filled 2023-01-17: qty 1

## 2023-01-17 MED ORDER — SODIUM CHLORIDE 0.9 % IV SOLN
3.6000 mg/kg | Freq: Once | INTRAVENOUS | Status: AC
  Administered 2023-01-17: 260 mg via INTRAVENOUS
  Filled 2023-01-17: qty 8

## 2023-01-17 MED ORDER — HEPARIN SOD (PORK) LOCK FLUSH 100 UNIT/ML IV SOLN
500.0000 [IU] | Freq: Once | INTRAVENOUS | Status: AC | PRN
  Administered 2023-01-17: 500 [IU]

## 2023-01-17 MED ORDER — SODIUM CHLORIDE 0.9% FLUSH
10.0000 mL | INTRAVENOUS | Status: DC | PRN
  Administered 2023-01-17: 10 mL

## 2023-01-17 MED ORDER — SODIUM CHLORIDE 0.9 % IV SOLN: Freq: Once | INTRAVENOUS | Status: AC

## 2023-01-17 MED ORDER — SODIUM CHLORIDE 0.9% FLUSH
10.0000 mL | Freq: Once | INTRAVENOUS | Status: AC
  Administered 2023-01-17: 10 mL

## 2023-01-17 MED ORDER — ACETAMINOPHEN 325 MG PO TABS
650.0000 mg | ORAL_TABLET | Freq: Once | ORAL | Status: AC
  Administered 2023-01-17: 650 mg via ORAL
  Filled 2023-01-17: qty 2

## 2023-01-17 NOTE — Assessment & Plan Note (Signed)
invasive lobular carcinoma, Stage IIIA, c(T3, N1), yp(T3, N1a), ER+/PR-/HER2+, Grade 2  -Diagnosed in 01/2022 -s/p neoadjuvant chemo TCHP X6, followed by right mastectomy by Dr. Donne Hazel and reconstruction by Dr. Iran Planas. Unfortunately she did not have much response to neoadjvuant chemo  -on adjuvant Kadcyla now, tolerating well with no noticeable side effects. Plan for one year treatment, through 08/2023. -she is also receiving postmastectomy radiation under Dr. Lisbeth Renshaw, 10/10 - 10/29/22. -she started adjuvant anastrozole in January 2024 -Echocardiogram in January 2024 showed slightly decreased EF 50 to 55%.  She was seen by cardiologist after echo, her cardiac medication was adjusted, and we will continue HER2 antibody.  Next repeated echo in April 2024.

## 2023-01-17 NOTE — Patient Instructions (Signed)
Ashley Robertson  Discharge Instructions: Thank you for choosing [facility] to provide your oncology and hematology care.   If you have a lab appointment with the [facility], please go directly to the [facility] and check in at the registration area.   Wear comfortable clothing and clothing appropriate for easy access to any Portacath or PICC line.   We strive to give you quality time with your provider. You may need to reschedule your appointment if you arrive late (15 or more minutes).  Arriving late affects you and other patients whose appointments are after yours.  Also, if you miss three or more appointments without notifying the office, you may be dismissed from the clinic at the provider's discretion.      For prescription refill requests, have your pharmacy contact our office and allow 72 hours for refills to be completed.    Today you received the following chemotherapy and/or immunotherapy agents Kadcyla      To help prevent nausea and vomiting after your treatment, we encourage you to take your nausea medication as directed.  BELOW ARE SYMPTOMS THAT SHOULD BE REPORTED IMMEDIATELY: *FEVER GREATER THAN 100.4 F (38 C) OR HIGHER *CHILLS OR SWEATING *NAUSEA AND VOMITING THAT IS NOT CONTROLLED WITH YOUR NAUSEA MEDICATION *UNUSUAL SHORTNESS OF BREATH *UNUSUAL BRUISING OR BLEEDING *URINARY PROBLEMS (pain or burning when urinating, or frequent urination) *BOWEL PROBLEMS (unusual diarrhea, constipation, pain near the anus) TENDERNESS IN MOUTH AND THROAT WITH OR WITHOUT PRESENCE OF ULCERS (sore throat, sores in mouth, or a toothache) UNUSUAL RASH, SWELLING OR PAIN  UNUSUAL VAGINAL DISCHARGE OR ITCHING   Items with * indicate a potential emergency and should be followed up as soon as possible or go to the Emergency Department if any problems should occur.  Please show the CHEMOTHERAPY ALERT CARD or IMMUNOTHERAPY ALERT CARD at check-in  to the Emergency Department and triage nurse.  Should you have questions after your visit or need to cancel or reschedule your appointment, please contact [facility]  Dept: [phone]  and follow the prompts.  Office hours are 8:00 a.m. to 4:30 p.m. Monday - Friday. Please note that voicemails left after 4:00 p.m. may not be returned until the following business day.  We are closed weekends and major holidays. You have access to a nurse at all times for urgent questions. Please call the main number to the clinic Dept: [phone] and follow the prompts.   For any non-urgent questions, you may also contact your provider using MyChart. We now offer e-Visits for anyone 18 and older to request care online for non-urgent symptoms. For details visit [email or web address].   Also download the MyChart app! Go to the app store, search "MyChart", open the app, select [facility], and log in with your MyChart username and password.  Masks are optional in the cancer centers. If you would like for your care team to wear a mask while they are taking care of you, please let them know. You may have one support person who is at least 68 years old accompany you for your appointments.  Ado-Trastuzumab Emtansine Injection What is this medication? ADO-TRASTUZUMAB EMTANSINE (ADD oh traz TOO zuh mab em TAN zine) treats breast cancer. It works by blocking a protein that causes cancer cells to grow and multiply. This helps to slow or stop the spread of cancer cells. This medicine may be used for other purposes; ask your health  care provider or pharmacist if you have questions. COMMON BRAND NAME(S): Kadcyla What should I tell my care team before I take this medication? They need to know if you have any of these conditions: Heart failure Liver disease Low platelet levels Lung disease Tingling of the fingers or toes or other nerve disorder An unusual or allergic reaction to  ado-trastuzumab emtansine, other medications, foods, dyes, or preservatives Pregnant or trying to get pregnant Breast-feeding How should I use this medication? This medication is infused into a vein. It is given by your care team in a hospital or clinic setting. Talk to your care team about the use of this medication in children. Special care may be needed. Overdosage: If you think you have taken too much of this medicine contact a poison control center or emergency room at once. NOTE: This medicine is only for you. Do not share this medicine with others. What if I miss a dose? Keep appointments for follow-up doses. It is important not to miss your dose. Call your care team if you are unable to keep an appointment. What may interact with this medication? Atazanavir Boceprevir Clarithromycin Dalfopristin; quinupristin Delavirdine Indinavir Isoniazid, INH Itraconazole Ketoconazole Nefazodone Nelfinavir Ritonavir Telaprevir Telithromycin Tipranavir Voriconazole This list may not describe all possible interactions. Give your health care provider a list of all the medicines, herbs, non-prescription drugs, or dietary supplements you use. Also tell them if you smoke, drink alcohol, or use illegal drugs. Some items may interact with your medicine. What should I watch for while using this medication? This medication may make you feel generally unwell. This is not uncommon, as chemotherapy can affect healthy cells as well as cancer cells. Report any side effects. Continue your course of treatment even though you feel ill unless your care team tells you to stop. You may need blood work while taking this medication. This medication may increase your risk to bruise or bleed. Call your care team if you notice any unusual bleeding. Be careful brushing or flossing your teeth or using a toothpick because you may get an infection or bleed more easily. If you have any dental work done, tell your dentist  you are receiving this medication. Talk to your care team if you may be pregnant. Serious birth defects can occur if you take this medication during pregnancy and for 7 months after the last dose. You will need a negative pregnancy test before starting this medication. Contraception is recommended while taking this medication and for 7 months after the last dose. Your care team can help you find the option that works for you. If your partner can get pregnant, use a condom during sex while taking this medication and for 4 months after the last dose. Do not breastfeed while taking this medication and for 7 months after the last dose. This medication may cause infertility. Talk to your care team if you are concerned with your fertility. What side effects may I notice from receiving this medication? Side effects that you should report to your care team as soon as possible: Allergic reactions--skin rash, itching, hives, swelling of the face, lips, tongue, or throat Bleeding--bloody or black, tar-like stools, vomiting blood or brown material that looks like coffee grounds, red or dark brown urine, small red or purple spots on skin, unusual bruising or bleeding Dry cough, shortness of breath or trouble breathing Heart failure--shortness of breath, swelling of the ankles, feet, or hands, sudden weight gain, unusual weakness or fatigue Infusion reactions--chest pain, shortness of  breath or trouble breathing, feeling faint or lightheaded Liver injury--right upper belly pain, loss of appetite, nausea, light-colored stool, dark yellow or brown urine, yellowing skin or eyes, unusual weakness or fatigue Pain, tingling, or numbness in the hands or feet Painful swelling, warmth, or redness of the skin, blisters or sores at the infusion site Side effects that usually do not require medical attention (report to your care team if they continue or are bothersome): Constipation Fatigue Headache Muscle  pain Nausea This list may not describe all possible side effects. Call your doctor for medical advice about side effects. You may report side effects to FDA at 1-800-FDA-1088. Where should I keep my medication? This medication is given in a hospital or clinic. It will not be stored at home. NOTE: This sheet is a summary. It may not cover all possible information. If you have questions about this medicine, talk to your doctor, pharmacist, or health care provider.  2023 Elsevier/Gold Standard (2022-04-06 00:00:00)

## 2023-01-17 NOTE — Assessment & Plan Note (Signed)
-  Her most recent DEXA was on 04/29/20 showing osteopenia (T-score of -2.1 at L1-3). -continue calcium and vitD  -We discussed the negative impact on bone density from aromatase inhibitor -I recommend her to consider Zometa infusion every 6 months for 2 years, benefit and side effect discussed with her. She will think about it

## 2023-01-25 ENCOUNTER — Other Ambulatory Visit (HOSPITAL_COMMUNITY): Payer: PPO

## 2023-01-25 NOTE — Progress Notes (Unsigned)
POST OPERATIVE OFFICE NOTE    CC:  F/u for surgery  HPI:  This is a 67 y.o. female who is s/p open repair of juxtarenal AAA with bifurcated dacryon graft on 01/11/2023 by Dr. Trula Slade.   Her post op course was unremarkable and she was discharged on POD 4.    Pt returns today for follow up.  Pt states she has done very well since being discharged.  She really has not had any pain.  Her appetite is back to normal and she is having bowel movements.  She has had a chemo treatment since discharge and she did very well with that also.  She is not taking asa as she is on Eliquis.  She is taking her statin.   Allergies  Allergen Reactions   Lisinopril Other (See Comments) and Cough    High potassium   Lipitor [Atorvastatin] Other (See Comments)    Myalgias    Current Outpatient Medications  Medication Sig Dispense Refill   albuterol (PROVENTIL HFA) 108 (90 Base) MCG/ACT inhaler Inhale 1 to 2 puffs into the lungs every 6 hours as needed 8.5 g 2   amiodarone (PACERONE) 200 MG tablet Take 1 tablet (200 mg total) by mouth daily for 21 days. 21 tablet 0   amiodarone (PACERONE) 400 MG tablet Take 1 tablet (400 mg total) by mouth 2 (two) times daily for 7 days. 14 tablet 0   amLODipine (NORVASC) 10 MG tablet Take 1 tablet (10 mg total) by mouth at bedtime. 90 tablet 3   anastrozole (ARIMIDEX) 1 MG tablet Take 1 tablet (1 mg total) by mouth daily. 30 tablet 2   apixaban (ELIQUIS) 5 MG TABS tablet Take 1 tablet (5 mg total) by mouth 2 (two) times daily. 60 tablet 1   aspirin 81 MG tablet Take 81 mg by mouth at bedtime.     Calcium Carb-Cholecalciferol (CALCIUM 500 + D3 PO) Take 1 tablet by mouth daily.     carvedilol (COREG) 6.25 MG tablet Take 1 tablet (6.25 mg total) by mouth 2 (two) times daily. 60 tablet 3   Cholecalciferol (VITAMIN D) 50 MCG (2000 UT) CAPS Take 1 capsule (2,000 Units total) by mouth daily. 30 capsule 2   FLUoxetine (PROZAC) 20 MG capsule Take 1 capsule (20 mg total) by mouth daily.  90 capsule 1   Fluticasone-Umeclidin-Vilant (TRELEGY ELLIPTA) 200-62.5-25 MCG/ACT AEPB Inhale 1 puff into the lungs daily. 60 each 2   ibuprofen (ADVIL) 200 MG tablet Take 600 mg by mouth every 6 (six) hours as needed for moderate pain.     OVER THE COUNTER MEDICATION Take 1 capsule by mouth daily. Serrapeptase 120,000 units daily  Reported on 12/27/2015     oxyCODONE-acetaminophen (PERCOCET/ROXICET) 5-325 MG tablet Take 1 tablet by mouth every 6 (six) hours as needed for moderate pain. 20 tablet 0   rosuvastatin (CRESTOR) 5 MG tablet Take 1 tablet by mouth daily at bedtime ,except on Monday, Wednesday and Fridays take 2 tablets at bedtime as directed 180 tablet 3   Specialty Vitamins Products (MAGNESIUM, AMINO ACID CHELATE,) 133 MG tablet Take 1 tablet by mouth at bedtime.     No current facility-administered medications for this visit.     ROS:  See HPI  Physical Exam:  Today's Vitals   01/28/23 0954  BP: (!) 157/73  Pulse: (!) 55  Resp: 20  Temp: 98.1 F (36.7 C)  TempSrc: Temporal  SpO2: 95%  Weight: 154 lb 11.2 oz (70.2 kg)  Height: 5'  5" (1.651 m)   Body mass index is 25.74 kg/m.   Incision:  healing nicely Extremities:  easily palpable PT pulses bilaterally Abdomen:  soft, NT    Assessment/Plan:  This is a 67 y.o. female who is s/p: open repair of juxtarenal AAA with bifurcated dacryon graft on 01/11/2023 by Dr. Trula Slade.   Her post op course was unremarkable and she was discharged on POD 4.    -pt doing well since discharge.  Her appetite is back to normal.   -ok to drive a car but would hold off on heavy lifting for 3 months as she is at higher risk for hernia given she is immunocompromised on chemo. -continue /Eliquis/crestor -she will f/u with  Dr. Trula Slade in 6 months - no studies.  Leontine Locket, [facility] [phone]   Clinic MD:  pt seen with Dr. Trula Slade

## 2023-01-28 ENCOUNTER — Ambulatory Visit (INDEPENDENT_AMBULATORY_CARE_PROVIDER_SITE_OTHER): Payer: PPO | Admitting: Physician Assistant

## 2023-01-28 ENCOUNTER — Other Ambulatory Visit (HOSPITAL_COMMUNITY): Payer: Self-pay

## 2023-01-28 ENCOUNTER — Ambulatory Visit: Payer: PPO | Attending: Plastic Surgery

## 2023-01-28 ENCOUNTER — Other Ambulatory Visit: Payer: Self-pay

## 2023-01-28 ENCOUNTER — Encounter: Payer: Self-pay | Admitting: Physician Assistant

## 2023-01-28 VITALS — Wt 154.4 lb

## 2023-01-28 VITALS — BP 157/73 | HR 55 | Temp 98.1°F | Resp 20 | Ht 65.0 in | Wt 154.7 lb

## 2023-01-28 DIAGNOSIS — I7142 Juxtarenal abdominal aortic aneurysm, without rupture: Secondary | ICD-10-CM

## 2023-01-28 DIAGNOSIS — Z483 Aftercare following surgery for neoplasm: Secondary | ICD-10-CM | POA: Insufficient documentation

## 2023-01-28 NOTE — Therapy (Signed)
OUTPATIENT PHYSICAL THERAPY SOZO SCREENING NOTE   Patient Name: Ashley Robertson MRN: [id number] DOB:Apr 05, 1956, 67 y.o., female Today's Date: 01/28/2023  PCP: Cari Caraway, MD REFERRING PROVIDER: Irene Limbo, MD   PT End of Session - 01/28/23 0759     Visit Number 2   # unchanged due to screen only   PT Start Time 0756    PT Stop Time 0801    PT Time Calculation (min) 5 min    Activity Tolerance Patient tolerated treatment well    Behavior During Therapy [facility] for tasks assessed/performed             Past Medical History:  Diagnosis Date   Allergy    Lisinopril and lipitor   Anxiety and depression    Aortic atherosclerosis ([place]) 03/2020   CT Chest: 2 V (LAD & LCx) Coronary Atherosclerosis, Aortic Atherosclerosis (no aneurysm).  Mild centrilobular emphysema with mild diffuse bronchial thickening; several scattered small solitary pulmonary nodules (largest 5.6 mm in anterior left upper lobe)   Breast cancer ([place]) 12/2021   Right breast ILC   Carotid artery plaque, bilateral 10/2015   Mild to moderate plaque L>R without significant stenosis   COPD (chronic obstructive pulmonary disease) (HCC)    Coronary Artery Calcification - Score 79    Coronary Calcium Score 79.  LAD and circumflex calcification noted.  Normal ascending aorta with mild calcification.   Current every day smoker    pt quit smoking April 2023   Emphysema lung [facility])    Noted on chest CT   Family history of breast cancer 02/21/2022   Family history of prostate cancer 02/21/2022   Hyperlipidemia    Hypertension    Controlled with amlodipine   Prediabetes    Skin cancer 2021   Remove 2022   Past Surgical History:  Procedure Laterality Date   ABDOMINAL AORTIC ANEURYSM REPAIR N/A 01/11/2023   Procedure: ANEURYSM ABDOMINAL AORTIC REPAIR;  Surgeon: Serafina Mitchell, MD;  Location: [facility];  Service: Vascular;  Laterality: N/A;   BREAST BIOPSY Right    times 3   BREAST RECONSTRUCTION WITH PLACEMENT OF  TISSUE EXPANDER AND ALLODERM Right 07/26/2022   Procedure: RIGHT BREAST RECONSTRUCTION WITH PLACEMENT OF TISSUE EXPANDER AND ALLODERM;  Surgeon: Irene Limbo, MD;  Location: [facility];  Service: Plastics;  Laterality: Right;   [place]   Rhinoplasty   MASTECTOMY Right    MASTECTOMY W/ SENTINEL NODE BIOPSY Right 07/26/2022   Procedure: RIGHT MASTECTOMY, RIGHT AXILLARY SENTINEL NODE BIOPSY;  Surgeon: Rolm Bookbinder, MD;  Location: [facility];  Service: General;  Laterality: Right;  GEN & PEC BLOCK   PORTACATH PLACEMENT Left 03/06/2022   Procedure: INSERTION PORT-A-CATH;  Surgeon: Rolm Bookbinder, MD;  Location: [facility];  Service: General;  Laterality: Left;   RADIOACTIVE SEED GUIDED AXILLARY SENTINEL LYMPH NODE Right 07/26/2022   Procedure: RADIOACTIVE SEED GUIDED AXILLARY SENTINEL LYMPH NODE DISSECTION;  Surgeon: Rolm Bookbinder, MD;  Location: [facility];  Service: General;  Laterality: Right;   [place] ECHOCARDIOGRAM  10/2016   EF 55 to 60%.  Normal systolic and diastolic function.  No ASD/PFO   TUBAL LIGATION  1991   Patient Active Problem List   Diagnosis Date Noted   AAA (abdominal aortic aneurysm) ([place]) 01/11/2023   S/P AAA repair 01/11/2023   Osteopenia 10/21/2022   Aortic atherosclerosis ([place]) 08/10/2022   S/P mastectomy,  right 07/26/2022   Carotid atherosclerosis 03/28/2022   Coronary artery disease 03/28/2022   Pulmonary emphysema ([place]) 03/28/2022   Recurrent major depression in remission ([place]) 03/28/2022   Port-A-Cath in place 03/27/2022   Genetic testing 03/12/2022   Family history of breast cancer 02/21/2022   Family history of prostate cancer 02/21/2022   Hypercholesterolemia 02/21/2022   Malignant neoplasm of overlapping sites of right breast in female, estrogen receptor positive ([facility]) 02/16/2022   Bilateral carotid bruits 04/26/2020   Essential  hypertension 03/19/2020   Hyperlipidemia with target LDL less than 100 03/19/2020   Prediabetes 03/19/2020   Agatston CAC score, <100 03/15/2020    REFERRING DIAG: Rt breast cancer at risk for lymphedema  THERAPY DIAG: Aftercare following surgery for neoplasm  PERTINENT HISTORY: Patient was diagnosed on 02/08/2022 with right grade II invasive lobular carcinoma breast cancer. It measured 2.6 cm and 3.3 cm (2 masses) and is located in the upper outer quadrant. It is ER positive, PR negative, and HER2 positive with a Ki67 of 10%. Patient has COPD and hypertension and a 5cm AAA. She is-s/p right mastectomy  with SLNB 07/26/2022 by Dr. Donne Hazel and reconstruction by Dr. Iran Planas. Path showed 9.5 cm invasive lobular carcinoma involving dermis of nipple, margins negative, 2/5 positive lymph nodes. -given the significant residual disease, the recommendation is for postmastectomy radiation and switch to adjuvant Kadcyla  PRECAUTIONS: right UE Lymphedema risk, None  SUBJECTIVE: Pt returns for her 3 month L-Dex screen.   PAIN:  Are you having pain? No  SOZO SCREENING: Patient was assessed today using the SOZO machine to determine the lymphedema index score. This was compared to her baseline score. It was determined that she is within the recommended range when compared to her baseline and no further action is needed at this time. She will continue SOZO screenings. These are done every 3 months for 2 years post operatively followed by every 6 months for 2 years, and then annually.   L-DEX FLOWSHEETS - 01/28/23 0700       L-DEX LYMPHEDEMA SCREENING   Measurement Type Unilateral    L-DEX MEASUREMENT EXTREMITY Upper Extremity    POSITION  Standing    DOMINANT SIDE Right    At Risk Side Right    BASELINE SCORE (UNILATERAL) -0.2    L-DEX SCORE (UNILATERAL) 0.2    VALUE CHANGE (UNILAT) 0.4               Otelia Limes, PTA 01/28/2023, 8:00 AM

## 2023-01-30 ENCOUNTER — Ambulatory Visit: Payer: PPO | Admitting: Internal Medicine

## 2023-01-30 ENCOUNTER — Other Ambulatory Visit: Payer: Self-pay

## 2023-02-01 ENCOUNTER — Other Ambulatory Visit (HOSPITAL_COMMUNITY): Payer: Self-pay

## 2023-02-04 ENCOUNTER — Other Ambulatory Visit: Payer: Self-pay

## 2023-02-06 ENCOUNTER — Other Ambulatory Visit: Payer: Self-pay

## 2023-02-06 DIAGNOSIS — C50811 Malignant neoplasm of overlapping sites of right female breast: Secondary | ICD-10-CM

## 2023-02-06 NOTE — Assessment & Plan Note (Signed)
-  Her most recent DEXA was on 04/29/20 showing osteopenia (T-score of -2.1 at L1-3). -continue calcium and vitD  -We discussed the negative impact on bone density from aromatase inhibitor -I previously recommended her to consider Zometa infusion every 6 months for 2 years, benefit and side effect discussed with her. She will think about it

## 2023-02-06 NOTE — Assessment & Plan Note (Signed)
invasive lobular carcinoma, Stage IIIA, c(T3, N1), yp(T3, N1a), ER+/PR-/HER2+, Grade 2  -Diagnosed in 01/2022 -s/p neoadjuvant chemo TCHP X6, followed by right mastectomy by Dr. Donne Hazel and reconstruction by Dr. Iran Planas. Unfortunately she did not have much response to neoadjvuant chemo  -on adjuvant Kadcyla now, tolerating well with no noticeable side effects. Plan for one year treatment, through 08/2023. -she is also receiving postmastectomy radiation under Dr. Lisbeth Renshaw, 10/10 - 10/29/22. -she started adjuvant anastrozole in January 2024 -Echocardiogram in January 2024 showed slightly decreased EF 50 to 55%.  She was seen by cardiologist after echo, her cardiac medication was adjusted, and we will continue HER2 antibody.  Next repeated echo in April 2024.

## 2023-02-07 ENCOUNTER — Encounter: Payer: Self-pay | Admitting: Hematology

## 2023-02-07 ENCOUNTER — Inpatient Hospital Stay: Payer: PPO

## 2023-02-07 ENCOUNTER — Inpatient Hospital Stay: Payer: PPO | Attending: Hematology | Admitting: Hematology

## 2023-02-07 VITALS — BP 128/72 | HR 54 | Temp 98.1°F | Resp 18 | Ht 65.0 in | Wt 156.3 lb

## 2023-02-07 DIAGNOSIS — Z7982 Long term (current) use of aspirin: Secondary | ICD-10-CM | POA: Insufficient documentation

## 2023-02-07 DIAGNOSIS — Z79899 Other long term (current) drug therapy: Secondary | ICD-10-CM | POA: Insufficient documentation

## 2023-02-07 DIAGNOSIS — C773 Secondary and unspecified malignant neoplasm of axilla and upper limb lymph nodes: Secondary | ICD-10-CM | POA: Diagnosis not present

## 2023-02-07 DIAGNOSIS — Z170 Estrogen receptor positive status [ER+]: Secondary | ICD-10-CM

## 2023-02-07 DIAGNOSIS — M8588 Other specified disorders of bone density and structure, other site: Secondary | ICD-10-CM | POA: Insufficient documentation

## 2023-02-07 DIAGNOSIS — Z79811 Long term (current) use of aromatase inhibitors: Secondary | ICD-10-CM | POA: Diagnosis not present

## 2023-02-07 DIAGNOSIS — Z95828 Presence of other vascular implants and grafts: Secondary | ICD-10-CM

## 2023-02-07 DIAGNOSIS — Z9221 Personal history of antineoplastic chemotherapy: Secondary | ICD-10-CM | POA: Diagnosis not present

## 2023-02-07 DIAGNOSIS — Z923 Personal history of irradiation: Secondary | ICD-10-CM | POA: Diagnosis not present

## 2023-02-07 DIAGNOSIS — Z9011 Acquired absence of right breast and nipple: Secondary | ICD-10-CM | POA: Insufficient documentation

## 2023-02-07 DIAGNOSIS — Z5112 Encounter for antineoplastic immunotherapy: Secondary | ICD-10-CM | POA: Insufficient documentation

## 2023-02-07 DIAGNOSIS — C50811 Malignant neoplasm of overlapping sites of right female breast: Secondary | ICD-10-CM | POA: Insufficient documentation

## 2023-02-07 LAB — CBC WITH DIFFERENTIAL (CANCER CENTER ONLY)
Abs Immature Granulocytes: 0.01 10*3/uL (ref 0.00–0.07)
Basophils Absolute: 0 10*3/uL (ref 0.0–0.1)
Basophils Relative: 1 %
Eosinophils Absolute: 0.2 10*3/uL (ref 0.0–0.5)
Eosinophils Relative: 4 %
HCT: 32 % — ABNORMAL LOW (ref 36.0–46.0)
Hemoglobin: 10.8 g/dL — ABNORMAL LOW (ref 12.0–15.0)
Immature Granulocytes: 0 %
Lymphocytes Relative: 17 %
Lymphs Abs: 0.8 10*3/uL (ref 0.7–4.0)
MCH: 31.8 pg (ref 26.0–34.0)
MCHC: 33.8 g/dL (ref 30.0–36.0)
MCV: 94.1 fL (ref 80.0–100.0)
Monocytes Absolute: 0.5 10*3/uL (ref 0.1–1.0)
Monocytes Relative: 11 %
Neutro Abs: 3.3 10*3/uL (ref 1.7–7.7)
Neutrophils Relative %: 67 %
Platelet Count: 120 10*3/uL — ABNORMAL LOW (ref 150–400)
RBC: 3.4 MIL/uL — ABNORMAL LOW (ref 3.87–5.11)
RDW: 14.9 % (ref 11.5–15.5)
WBC Count: 4.8 10*3/uL (ref 4.0–10.5)
nRBC: 0 % (ref 0.0–0.2)

## 2023-02-07 LAB — CMP (CANCER CENTER ONLY)
ALT: 14 U/L (ref 0–44)
AST: 22 U/L (ref 15–41)
Albumin: 3.6 g/dL (ref 3.5–5.0)
Alkaline Phosphatase: 126 U/L (ref 38–126)
Anion gap: 7 (ref 5–15)
BUN: 16 mg/dL (ref 8–23)
CO2: 29 mmol/L (ref 22–32)
Calcium: 9.1 mg/dL (ref 8.9–10.3)
Chloride: 100 mmol/L (ref 98–111)
Creatinine: 0.75 mg/dL (ref 0.44–1.00)
GFR, Estimated: >60 mL/min (ref >60)
Glucose, Bld: 113 mg/dL — ABNORMAL HIGH (ref 70–99)
Potassium: 4.1 mmol/L (ref 3.5–5.1)
Sodium: 136 mmol/L (ref 135–145)
Total Bilirubin: 0.4 mg/dL (ref 0.3–1.2)
Total Protein: 7.2 g/dL (ref 6.5–8.1)

## 2023-02-07 MED ORDER — ACETAMINOPHEN 325 MG PO TABS
650.0000 mg | ORAL_TABLET | Freq: Once | ORAL | Status: AC
  Administered 2023-02-07: 650 mg via ORAL
  Filled 2023-02-07: qty 2

## 2023-02-07 MED ORDER — PROCHLORPERAZINE MALEATE 10 MG PO TABS
10.0000 mg | ORAL_TABLET | Freq: Once | ORAL | Status: AC
  Administered 2023-02-07: 10 mg via ORAL
  Filled 2023-02-07: qty 1

## 2023-02-07 MED ORDER — SODIUM CHLORIDE 0.9% FLUSH
10.0000 mL | INTRAVENOUS | Status: DC | PRN
  Administered 2023-02-07: 10 mL

## 2023-02-07 MED ORDER — SODIUM CHLORIDE 0.9 % IV SOLN
3.6000 mg/kg | Freq: Once | INTRAVENOUS | Status: AC
  Administered 2023-02-07: 260 mg via INTRAVENOUS
  Filled 2023-02-07: qty 8

## 2023-02-07 MED ORDER — CETIRIZINE HCL 10 MG/ML IV SOLN
10.0000 mg | Freq: Once | INTRAVENOUS | Status: AC
  Administered 2023-02-07: 10 mg via INTRAVENOUS
  Filled 2023-02-07: qty 1

## 2023-02-07 MED ORDER — SODIUM CHLORIDE 0.9% FLUSH
10.0000 mL | Freq: Once | INTRAVENOUS | Status: AC
  Administered 2023-02-07: 10 mL

## 2023-02-07 MED ORDER — HEPARIN SOD (PORK) LOCK FLUSH 100 UNIT/ML IV SOLN
500.0000 [IU] | Freq: Once | INTRAVENOUS | Status: AC | PRN
  Administered 2023-02-07: 500 [IU]

## 2023-02-07 MED ORDER — SODIUM CHLORIDE 0.9 % IV SOLN: Freq: Once | INTRAVENOUS | Status: AC

## 2023-02-07 NOTE — Progress Notes (Signed)
[place]   [phone] [phone]   Clinic Follow up Note   Patient Care Team: Cari Caraway, MD as PCP - General (Family Medicine) Leonie Man, MD as PCP - Cardiology (Cardiology) Mauro Kaufmann, RN as Oncology Nurse Navigator Rockwell Germany, RN as Oncology Nurse Navigator Rolm Bookbinder, MD as Consulting Physician (General Surgery) Truitt Merle, MD as Consulting Physician (Hematology) Kyung Rudd, MD as Consulting Physician (Radiation Oncology)  Date of Service:  02/07/2023  CHIEF COMPLAINT: f/u of right breast cancer    CURRENT THERAPY:  Kadcyla, q21d, started 08/24/22, plan for a total of 14 cycles  Anastrozole started in 12/2022      ASSESSMENT:  Ashley Robertson is a 67 y.o. female with   Malignant neoplasm of overlapping sites of right breast in female, estrogen receptor positive ([place]) invasive lobular carcinoma, Stage IIIA, c(T3, N1), yp(T3, N1a), ER+/PR-/HER2+, Grade 2  -Diagnosed in 01/2022 -s/p neoadjuvant chemo TCHP X6, followed by right mastectomy by Dr. Donne Hazel and reconstruction by Dr. Iran Planas. Unfortunately she did not have much response to neoadjvuant chemo  -on adjuvant Kadcyla now, tolerating well with no noticeable side effects. Plan for a total of 14 treatment -she received postmastectomy radiation under Dr. Lisbeth Renshaw, 10/10 - 10/29/22. -she started adjuvant anastrozole in January 2024, she is tolerating very well -Echocardiogram in January 2024 showed slightly decreased EF 50 to 55%.  She was seen by cardiologist after echo, her cardiac medication was adjusted, and we will continue HER2 antibody.  Next repeated echo in April 2024.    Osteopenia -Her most recent DEXA was on 04/29/20 showing osteopenia (T-score of -2.1 at L1-3). -continue calcium and vitD  -We discussed the negative impact on bone density from aromatase inhibitor -I previously recommended her to consider Zometa infusion every 6 months for 2 years,  benefit and side effect discussed with her. She will think about it       PLAN: -Labs reviewed -continue Anastrozole -proceed with C8 Kadcyla today and continue every 3 weeks  -lab/flush and f/u Kadcyla  03/21/2023, I will see her every other treatment    SUMMARY OF ONCOLOGIC HISTORY: Oncology History Overview Note   Cancer Staging  Malignant neoplasm of overlapping sites of right breast in female, estrogen receptor positive ([place]) Staging form: Breast, AJCC 8th Edition - Clinical stage from 02/09/2022: Stage IIA (cT2, cN1, cM0, G2, ER+, PR-, HER2+) - Signed by Truitt Merle, MD on 02/20/2022    Malignant neoplasm of overlapping sites of right breast in female, estrogen receptor positive ([place])  02/08/2022 Mammogram   CLINICAL DATA:  Acute onset right breast lump.  EXAM: DIGITAL DIAGNOSTIC UNILATERAL RIGHT MAMMOGRAM WITH TOMOSYNTHESIS AND CAD; ULTRASOUND RIGHT BREAST LIMITED  IMPRESSION: 1. Highly suspicious findings mammographically and sonographically. I suspect there is significant malignancy throughout most of the right breast. Discrete masses are seen at 11 o'clock and 6 o'clock.  A single borderline lymph node is identified. This lymph node appears prominent compared to the remainder of the lymph nodes. The skin thickening suggests the possibility of inflammatory breast cancer.   02/09/2022 Cancer Staging   Staging form: Breast, AJCC 8th Edition - Clinical stage from 02/09/2022: Stage IIIA (cT3, cN1, cM0, G2, ER+, PR-, HER2+) - Signed by Truitt Merle, MD on 03/05/2022 Stage prefix: Initial diagnosis Histologic grading system: 3 grade system   02/09/2022 Initial Biopsy   Diagnosis 1. Breast, right, needle core biopsy, right breast 11 o'clock mass ribbon clip - INVASIVE MAMMARY CARCINOMA - SEE  COMMENT 2. Breast, right, needle core biopsy, right breast 6'oclock mass, coil clip - INVASIVE MAMMARY CARCINOMA - SEE COMMENT 3. Lymph node, needle/core biopsy, right axillary lymph node, tribell  clip - METASTATIC CARCINOMA INVOLVING NODAL TISSUE Microscopic Comment 1. and 2. The biopsy material shows an infiltrative proliferation of cells arranged linearly and in small clusters. Based on the biopsy, the carcinoma appears Nottingham grade 2 of 3 and measures 1.5 cm in greatest linear extent.  Addendum parts 1 and 2: Immunohistochemistry for E-cadherin is negative consistent with lobular carcinoma.  2. PROGNOSTIC INDICATORS Results: The tumor cells are EQUIVOCAL for Her2 (2+). Her2 by FISH will be performed and the results reported separately. Estrogen Receptor: 100%, POSITIVE, STRONG STAINING INTENSITY Progesterone Receptor: <1%, NEGATIVE Proliferation Marker Ki67: 10%  2. FLUORESCENCE IN-SITU HYBRIDIZATION Results: GROUP 1: HER2 **POSITIVE**   3. PROGNOSTIC INDICATORS Results: By immunohistochemistry, the tumor cells are POSITIVE for Her2 (3+). Estrogen Receptor: 100%, POSITIVE, STRONG STAINING INTENSITY Progesterone Receptor: <1%, NEGATIVE   02/16/2022 Initial Diagnosis   Malignant neoplasm of overlapping sites of right breast in female, estrogen receptor positive ([place])   03/01/2022 Imaging   EXAM: BILATERAL BREAST MRI WITH AND WITHOUT CONTRAST  IMPRESSION: 1. 8.5 x 8.3 x 5.9 cm area of confluent mass-like enhancement in the right breast involving all 4 quadrants and containing 2 biopsy marker clip artifacts, compatible with 4 quadrant biopsy-proven malignancy. 2. Biopsy-proven metastatic lymph node in the right axilla. 3. Possible metastatic intramammary lymph node in the posterior outer right breast just above the level of the nipple. 4. No evidence of malignancy on the left.   03/01/2022 Genetic Testing   Negative hereditary cancer genetic testing: no pathogenic variants detected in [facility] CustomNext-Cancer +RNAinsight Panel.  Variant of uncertain significance reported in BRIP1 at p.F934V (c.2800T>G). Report date is March 01, 2022.    The  CustomNext-Cancer+RNAinsight panel offered by [facility] includes sequencing and rearrangement analysis for the following 47 genes:  APC, ATM, AXIN2, BARD1, BMPR1A, BRCA1, BRCA2, BRIP1, CDH1, CDK4, CDKN2A, CHEK2, DICER1, EPCAM, GREM1, HOXB13, MEN1, MLH1, MSH2, MSH3, MSH6, MUTYH, NBN, NF1, NF2, NTHL1, PALB2, PMS2, POLD1, POLE, PTEN, RAD51C, RAD51D, RECQL, RET, SDHA, SDHAF2, SDHB, SDHC, SDHD, SMAD4, SMARCA4, STK11, TP53, TSC1, TSC2, and VHL.  RNA data is routinely analyzed for use in variant interpretation for all genes.   03/05/2022 Imaging   EXAM: CT CHEST, ABDOMEN, AND PELVIS WITH CONTRAST  IMPRESSION: 1. Asymmetric, masslike density of the glandular tissue of the right breast with overlying skin thickening, in keeping with known primary breast malignancy. 2. Enlarged, ill-defined right axillary lymph node containing a biopsy marking clip, consistent with known nodal metastatic disease. 3. No other evidence of lymphadenopathy or metastatic disease in the chest, abdomen, or pelvis. 4. Emphysema. Background of fine centrilobular nodularity, most concentrated in the lung apices, consistent with smoking-related respiratory bronchiolitis. 5. Aneurysm of the infrarenal abdominal aorta measuring up to 5.4 x 5.3 cm with a large burden of eccentric mural thrombus. Recommend follow-up CT/MR every 6 months and vascular consultation. This recommendation follows ACR consensus guidelines: White Paper of the ACR Incidental Findings Committee II on Vascular Findings. J Am Coll Radiol 2013; 10:789-794. 6. Coronary artery disease.   03/05/2022 Imaging   EXAM: NUCLEAR MEDICINE WHOLE BODY BONE SCAN  IMPRESSION: No evidence of bony metastatic disease.   03/07/2022 - 08/01/2022 Chemotherapy   Patient is on Treatment Plan : BREAST  Docetaxel + Carboplatin + Trastuzumab + Pertuzumab  (TCHP) q21d      07/26/2022 Surgery  Right mastectomy with axillary node seed guided excision converted to axillary node dissection  with reconstruction by Drs. Donne Hazel and Thimmappa   07/26/2022 Pathology Results   FINAL MICROSCOPIC DIAGNOSIS:   A. LYMPH NODE, RIGHT AXILLARY TARGETED, EXCISION:  - Metastatic carcinoma involving one lymph node, 2 cm (1/1).  - Focal extranodal extension.  - Biopsy site and biopsy clip.   B. BREAST, RIGHT, MASTECTOMY:  - Invasive lobular carcinoma, 9.5 cm (ypT3).  - Carcinoma involves dermis of nipple.  - All surgical margins negative for carcinoma.  - One lymph node negative for metastatic carcinoma (0/1).  - Biopsy sites and biopsy clips.  - See oncology table.   C. LYMPH NODE, RIGHT AXILLARY, SENTINEL, EXCISION:  - One lymph node negative for metastatic carcinoma (0/1).   D. LYMPH NODE, RIGHT AXILLARY, SENTINEL, EXCISION:  - Metastatic carcinoma in one lymph node, 0.5 cm. (1/1).   E. LYMPH NODE, RIGHT AXILLARY, SENTINEL, EXCISION:  - One lymph node negative for metastatic carcinoma (0/1).    07/26/2022 Cancer Staging   Cancer Staging  Malignant neoplasm of overlapping sites of right breast in female, estrogen receptor positive ([place]) Staging form: Breast, AJCC 8th Edition - Clinical stage from 02/09/2022: Stage IIIA (cT3, cN1, cM0, G2, ER+, PR-, HER2+) - Signed by Truitt Merle, MD on 03/05/2022 Stage prefix: Initial diagnosis Histologic grading system: 3 grade system - Pathologic stage from 07/26/2022: Stage IIIA (pT3, pN1a, cM0, G2, ER+, PR-, HER2+) - Signed by Alla Feeling, NP on 08/01/2022 Histologic grading system: 3 grade system    07/26/2022 Cancer Staging   Staging form: Breast, AJCC 8th Edition - Pathologic stage from 07/26/2022: Stage IIIA (pT3, pN1a, cM0, G2, ER+, PR-, HER2+) - Signed by Alla Feeling, NP on 08/01/2022 Histologic grading system: 3 grade system   08/24/2022 -  Chemotherapy   Patient is on Treatment Plan : BREAST ADO-Trastuzumab Emtansine (Kadcyla) q21d        INTERVAL HISTORY:  Ashley Robertson is here for a follow up of right breast cancer   She  was last seen by me on 01/17/2023 She presents to the clinic alone. Pt had surgery and she reports that it went well and she is healing nicely.     All other systems were reviewed with the patient and are negative.  MEDICAL HISTORY:  Past Medical History:  Diagnosis Date   Allergy    Lisinopril and lipitor   Anxiety and depression    Aortic atherosclerosis ([place]) 03/2020   CT Chest: 2 V (LAD & LCx) Coronary Atherosclerosis, Aortic Atherosclerosis (no aneurysm).  Mild centrilobular emphysema with mild diffuse bronchial thickening; several scattered small solitary pulmonary nodules (largest 5.6 mm in anterior left upper lobe)   Breast cancer ([place]) 12/2021   Right breast ILC   Carotid artery plaque, bilateral 10/2015   Mild to moderate plaque L>R without significant stenosis   COPD (chronic obstructive pulmonary disease) ([facility])    Coronary Artery Calcification - Score 79    Coronary Calcium Score 79.  LAD and circumflex calcification noted.  Normal ascending aorta with mild calcification.   Current every day smoker    pt quit smoking April 2023   Emphysema lung [facility])    Noted on chest CT   Family history of breast cancer 02/21/2022   Family history of prostate cancer 02/21/2022   Hyperlipidemia    Hypertension    Controlled with amlodipine   Prediabetes    Skin cancer 2021   Remove 2022  SURGICAL HISTORY: Past Surgical History:  Procedure Laterality Date   ABDOMINAL AORTIC ANEURYSM REPAIR N/A 01/11/2023   Procedure: ANEURYSM ABDOMINAL AORTIC REPAIR;  Surgeon: Serafina Mitchell, MD;  Location: [facility] OR;  Service: Vascular;  Laterality: N/A;   BREAST BIOPSY Right    times 3   BREAST RECONSTRUCTION WITH PLACEMENT OF TISSUE EXPANDER AND ALLODERM Right 07/26/2022   Procedure: RIGHT BREAST RECONSTRUCTION WITH PLACEMENT OF TISSUE EXPANDER AND ALLODERM;  Surgeon: Irene Limbo, MD;  Location: [place];  Service: Plastics;  Laterality: Right;   [place]   Rhinoplasty   MASTECTOMY Right    MASTECTOMY W/ SENTINEL NODE BIOPSY Right 07/26/2022   Procedure: RIGHT MASTECTOMY, RIGHT AXILLARY SENTINEL NODE BIOPSY;  Surgeon: Rolm Bookbinder, MD;  Location: [place];  Service: General;  Laterality: Right;  GEN & PEC BLOCK   PORTACATH PLACEMENT Left 03/06/2022   Procedure: INSERTION PORT-A-CATH;  Surgeon: Rolm Bookbinder, MD;  Location: [place];  Service: General;  Laterality: Left;   RADIOACTIVE SEED GUIDED AXILLARY SENTINEL LYMPH NODE Right 07/26/2022   Procedure: RADIOACTIVE SEED GUIDED AXILLARY SENTINEL LYMPH NODE DISSECTION;  Surgeon: Rolm Bookbinder, MD;  Location: [place];  Service: General;  Laterality: Right;   [place] ECHOCARDIOGRAM  10/2016   EF 55 to 60%.  Normal systolic and diastolic function.  No ASD/PFO   TUBAL LIGATION  1991    I have reviewed the social history and family history with the patient and they are unchanged from previous note.  ALLERGIES:  is allergic to lisinopril and lipitor [atorvastatin].  MEDICATIONS:  Current Outpatient Medications  Medication Sig Dispense Refill   albuterol (PROVENTIL HFA) 108 (90 Base) MCG/ACT inhaler Inhale 1 to 2 puffs into the lungs every 6 hours as needed 8.5 g 2   amiodarone (PACERONE) 200 MG tablet Take 1 tablet (200 mg total) by mouth daily for 21 days. (Patient not taking: Reported on 01/28/2023) 21 tablet 0   amiodarone (PACERONE) 400 MG tablet Take 1 tablet (400 mg total) by mouth 2 (two) times daily for 7 days. 14 tablet 0   amLODipine (NORVASC) 10 MG tablet Take 1 tablet (10 mg total) by mouth at bedtime. 90 tablet 3   anastrozole (ARIMIDEX) 1 MG tablet Take 1 tablet (1 mg total) by mouth daily. 30 tablet 2   apixaban (ELIQUIS) 5 MG TABS tablet Take 1 tablet (5 mg total) by mouth 2 (two) times daily. 60 tablet 1   aspirin 81 MG tablet Take 81 mg by mouth at bedtime.     Calcium Carb-Cholecalciferol  (CALCIUM 500 + D3 PO) Take 1 tablet by mouth daily.     carvedilol (COREG) 6.25 MG tablet Take 1 tablet (6.25 mg total) by mouth 2 (two) times daily. 60 tablet 3   Cholecalciferol (VITAMIN D) 50 MCG (2000 UT) CAPS Take 1 capsule by mouth daily. 30 capsule 2   FLUoxetine (PROZAC) 20 MG capsule Take 1 capsule (20 mg total) by mouth daily. 90 capsule 1   Fluticasone-Umeclidin-Vilant (TRELEGY ELLIPTA) 200-62.5-25 MCG/ACT AEPB Inhale 1 puff into the lungs daily. 60 each 2   ibuprofen (ADVIL) 200 MG tablet Take 600 mg by mouth every 6 (six) hours as needed for moderate pain.     OVER THE COUNTER MEDICATION Take 1 capsule by mouth daily. Serrapeptase 120,000 units daily  Reported on 12/27/2015     oxyCODONE-acetaminophen (PERCOCET/ROXICET) 5-325 MG  tablet Take 1 tablet by mouth every 6 (six) hours as needed for moderate pain. 20 tablet 0   rosuvastatin (CRESTOR) 5 MG tablet Take 1 tablet by mouth daily at bedtime ,except on Monday, Wednesday and Fridays take 2 tablets at bedtime as directed 180 tablet 3   Specialty Vitamins Products (MAGNESIUM, AMINO ACID CHELATE,) 133 MG tablet Take 1 tablet by mouth at bedtime.     No current facility-administered medications for this visit.   Facility-Administered Medications Ordered in Other Visits  Medication Dose Route Frequency Provider Last Rate Last Admin   ado-trastuzumab emtansine (KADCYLA) 260 mg in sodium chloride 0.9 % 250 mL chemo infusion  3.6 mg/kg (Treatment Plan Recorded) Intravenous Once Truitt Merle, MD 526 mL/hr at 02/07/23 1129 260 mg at 02/07/23 1129   heparin lock flush 100 unit/mL  500 Units Intracatheter Once PRN Truitt Merle, MD       sodium chloride flush (NS) 0.9 % injection 10 mL  10 mL Intracatheter PRN Truitt Merle, MD        PHYSICAL EXAMINATION: ECOG PERFORMANCE STATUS: 0 - Asymptomatic  Vitals:   02/07/23 0954  BP: 128/72  Pulse: (!) 54  Resp: 18  Temp: 98.1 F (36.7 C)  SpO2: 95%   Wt Readings from Last 3 Encounters:  02/07/23 156  lb 4.8 oz (70.9 kg)  01/28/23 154 lb 11.2 oz (70.2 kg)  01/28/23 154 lb 6 oz (70 kg)     GENERAL:alert, no distress and comfortable SKIN: skin color normal, no rashes or significant lesions EYES: normal, Conjunctiva are pink and non-injected, sclera clear  NEURO: alert & oriented x 3 with fluent speech   LABORATORY DATA:  I have reviewed the data as listed    Latest Ref Rng & Units 02/07/2023    9:24 AM 01/17/2023    8:53 AM 01/15/2023    1:55 AM  CBC  WBC 4.0 - 10.5 K/uL 4.8  4.9  4.7   Hemoglobin 12.0 - 15.0 g/dL 10.8  9.6  9.3   Hematocrit 36.0 - 46.0 % 32.0  28.6  27.2   Platelets 150 - 400 K/uL 120  190  102         Latest Ref Rng & Units 02/07/2023    9:24 AM 01/17/2023    8:53 AM 01/15/2023   12:07 AM  CMP  Glucose 70 - 99 mg/dL 113  119  122   BUN 8 - 23 mg/dL '16  13  7   '$ Creatinine 0.44 - 1.00 mg/dL 0.75  0.69  0.57   Sodium 135 - 145 mmol/L 136  136  134   Potassium 3.5 - 5.1 mmol/L 4.1  3.4  3.9   Chloride 98 - 111 mmol/L 100  98  101   CO2 22 - 32 mmol/L '29  28  27   '$ Calcium 8.9 - 10.3 mg/dL 9.1  8.3  8.1   Total Protein 6.5 - 8.1 g/dL 7.2  6.6    Total Bilirubin 0.3 - 1.2 mg/dL 0.4  0.9    Alkaline Phos 38 - 126 U/L 126  81    AST 15 - 41 U/L 22  18    ALT 0 - 44 U/L 14  14        RADIOGRAPHIC STUDIES: I have personally reviewed the radiological images as listed and agreed with the findings in the report. No results found.    Orders Placed This Encounter  Procedures   CBC with Differential ([place] Only)  Standing Status:   Future    Standing Expiration Date:   04/10/2024   CMP ([place] only)    Standing Status:   Future    Standing Expiration Date:   04/10/2024   CBC with Differential ([place] Only)    Standing Status:   Future    Standing Expiration Date:   05/01/2024   CMP ([place] only)    Standing Status:   Future    Standing Expiration Date:   05/01/2024   CBC with Differential ([place] Only)    Standing Status:    Future    Standing Expiration Date:   05/22/2024   CMP ([place] only)    Standing Status:   Future    Standing Expiration Date:   05/22/2024   CBC with Differential ([place] Only)    Standing Status:   Future    Standing Expiration Date:   06/12/2024   CMP ([place] only)    Standing Status:   Future    Standing Expiration Date:   06/12/2024   All questions were answered. The patient knows to call the clinic with any problems, questions or concerns. No barriers to learning was detected. The total time spent in the appointment was 30 minutes.     Truitt Merle, MD 02/07/2023   Felicity Coyer, CMA, am acting as scribe for Truitt Merle, MD.   I have reviewed the above documentation for accuracy and completeness, and I agree with the above.

## 2023-02-08 DIAGNOSIS — E7800 Pure hypercholesterolemia, unspecified: Secondary | ICD-10-CM | POA: Diagnosis not present

## 2023-02-08 DIAGNOSIS — F339 Major depressive disorder, recurrent, unspecified: Secondary | ICD-10-CM | POA: Diagnosis not present

## 2023-02-08 DIAGNOSIS — I10 Essential (primary) hypertension: Secondary | ICD-10-CM | POA: Diagnosis not present

## 2023-02-08 DIAGNOSIS — J439 Emphysema, unspecified: Secondary | ICD-10-CM | POA: Diagnosis not present

## 2023-02-13 ENCOUNTER — Ambulatory Visit: Payer: PPO | Attending: Internal Medicine | Admitting: Internal Medicine

## 2023-02-13 ENCOUNTER — Other Ambulatory Visit (HOSPITAL_COMMUNITY): Payer: Self-pay

## 2023-02-13 ENCOUNTER — Encounter: Payer: Self-pay | Admitting: Internal Medicine

## 2023-02-13 VITALS — BP 112/62 | HR 53 | Ht 69.5 in | Wt 156.0 lb

## 2023-02-13 DIAGNOSIS — Z79899 Other long term (current) drug therapy: Secondary | ICD-10-CM | POA: Diagnosis not present

## 2023-02-13 DIAGNOSIS — Z5181 Encounter for therapeutic drug level monitoring: Secondary | ICD-10-CM

## 2023-02-13 MED ORDER — EZETIMIBE 10 MG PO TABS
10.0000 mg | ORAL_TABLET | Freq: Every day | ORAL | 3 refills | Status: DC
  Filled 2023-02-13: qty 90, 90d supply, fill #0
  Filled 2023-04-29: qty 90, 90d supply, fill #1
  Filled 2023-07-25: qty 90, 90d supply, fill #2
  Filled 2023-10-29: qty 90, 90d supply, fill #3

## 2023-02-13 NOTE — Progress Notes (Signed)
Cardiology Office Note:    Date:  02/13/2023   ID:  Ashley Robertson, DOB Apr 23, 1956, MRN [id number]  PCP:  Cari Caraway, [place] Providers Cardiologist:  Glenetta Hew, MD     Referring MD: Cari Caraway, MD   No chief complaint on file. Cardio-Oncology Clinic- on cardiotoxic cancer therapy  History of Present Illness:    Ashley Robertson is a 67 y.o. female with a hx of HER2+, ER+ R breast cancer diagnosed 01/2022, on Her2 therapy since 03/2022 s/p mastemctomy, XRT 09/11/2022-10/24/2022, HTN, referral to cardio-onc clinic from Dr. Burr Medico. She is doing well on her2. Her blood pressure has been elevated. Her last echo read as EF decline 50-55, looks closer to 55-60.  Her GLS was normal. I recommended started carvedilol for cardioprotection. Recommended to continue her2 therapy.   She was following Dr. Ellyn Hack for elevated CAC < 100. LDL goal <70. She saw Caron Presume. Dicussed infrarenal AA 5.4 x5.3 cm with a large burden of eccentric mural thrombus in April 2023, repeat shows infrarenal AA up to 5.7 cm, she is followed by vascular sx, she is planned for open repair.  No cardiac disease hx. She denies chest pain or SOB. No orthopnea/PND. No LE edema  She has grandkids, she is active with them.   Father had hypertension. Mother is healthy. Sister with hypertension. Brother had an MI while undergoing surgery for UC  The 10-year ASCVD risk score (Arnett DK, et al., 2019) is: 5.9%   Values used to calculate the score:     Age: 1 years     Sex: Female     Is Non-Hispanic African American: No     Diabetic: No     Tobacco smoker: No     Systolic Blood Pressure: [id number] mmHg     Is BP treated: Yes     HDL Cholesterol: 87 mg/dL     Total Cholesterol: 181 mg/dL   EKG 08/09/2022- NSR  Interim hx 02/13/2023 She underwent AAA repair. She developed postop afib. She was started on eliquis. She converted to sinus with amiodarone. She was seen by Dr. Audie Box  She denies  angina, dyspnea on exertion, lower extremity edema, PND or orthopnea. No LH   Cardiology Studies: TTE: 10/29/2016- EF 55-60%, no valve disease 03/05/2022-EF 55-60%, GLS attempted 07/04/2022- EF nl, GLS -14.7% 09/19/2022- EF nl 12/19/2022-  EF estimated 50-55, challenging windows, looks closer to 55-60. GLS -19% normal  03/24/2020- coronary CTA- CAC 50 , 80th percentile, LAD and Lcx CAC  Past Medical History:  Diagnosis Date   Allergy    Lisinopril and lipitor   Anxiety and depression    Aortic atherosclerosis ([place]) 03/2020   CT Chest: 2 V (LAD & LCx) Coronary Atherosclerosis, Aortic Atherosclerosis (no aneurysm).  Mild centrilobular emphysema with mild diffuse bronchial thickening; several scattered small solitary pulmonary nodules (largest 5.6 mm in anterior left upper lobe)   Breast cancer ([place]) 12/2021   Right breast ILC   Carotid artery plaque, bilateral 10/2015   Mild to moderate plaque L>R without significant stenosis   COPD (chronic obstructive pulmonary disease) (HCC)    Coronary Artery Calcification - Score 79    Coronary Calcium Score 79.  LAD and circumflex calcification noted.  Normal ascending aorta with mild calcification.   Current every day smoker    pt quit smoking April 2023   Emphysema lung [facility])    Noted on chest CT   Family history of breast cancer 02/21/2022  Family history of prostate cancer 02/21/2022   Hyperlipidemia    Hypertension    Controlled with amlodipine   Prediabetes    Skin cancer 2021   Remove 2022    Past Surgical History:  Procedure Laterality Date   ABDOMINAL AORTIC ANEURYSM REPAIR N/A 01/11/2023   Procedure: ANEURYSM ABDOMINAL AORTIC REPAIR;  Surgeon: Serafina Mitchell, MD;  Location: [place];  Service: Vascular;  Laterality: N/A;   BREAST BIOPSY Right    times 3   BREAST RECONSTRUCTION WITH PLACEMENT OF TISSUE EXPANDER AND ALLODERM Right 07/26/2022   Procedure: RIGHT BREAST RECONSTRUCTION WITH PLACEMENT OF TISSUE EXPANDER AND ALLODERM;   Surgeon: Irene Limbo, MD;  Location: [place];  Service: Plastics;  Laterality: Right;   [place]   Rhinoplasty   MASTECTOMY Right    MASTECTOMY W/ SENTINEL NODE BIOPSY Right 07/26/2022   Procedure: RIGHT MASTECTOMY, RIGHT AXILLARY SENTINEL NODE BIOPSY;  Surgeon: Rolm Bookbinder, MD;  Location: [place];  Service: General;  Laterality: Right;  GEN & PEC BLOCK   PORTACATH PLACEMENT Left 03/06/2022   Procedure: INSERTION PORT-A-CATH;  Surgeon: Rolm Bookbinder, MD;  Location: [place];  Service: General;  Laterality: Left;   RADIOACTIVE SEED GUIDED AXILLARY SENTINEL LYMPH NODE Right 07/26/2022   Procedure: RADIOACTIVE SEED GUIDED AXILLARY SENTINEL LYMPH NODE DISSECTION;  Surgeon: Rolm Bookbinder, MD;  Location: [place];  Service: General;  Laterality: Right;   [place] ECHOCARDIOGRAM  10/2016   EF 55 to 60%.  Normal systolic and diastolic function.  No ASD/PFO   TUBAL LIGATION  1991    Current Medications: Current Outpatient Medications on File Prior to Visit  Medication Sig Dispense Refill   albuterol (PROVENTIL HFA) 108 (90 Base) MCG/ACT inhaler Inhale 1 to 2 puffs into the lungs every 6 hours as needed 8.5 g 2   amLODipine (NORVASC) 10 MG tablet Take 1 tablet (10 mg total) by mouth at bedtime. 90 tablet 3   anastrozole (ARIMIDEX) 1 MG tablet Take 1 tablet (1 mg total) by mouth daily. 30 tablet 2   aspirin 81 MG tablet Take 81 mg by mouth at bedtime.     Calcium Carb-Cholecalciferol (CALCIUM 500 + D3 PO) Take 1 tablet by mouth daily.     carvedilol (COREG) 6.25 MG tablet Take 1 tablet (6.25 mg total) by mouth 2 (two) times daily. 60 tablet 3   Cholecalciferol (VITAMIN D) 50 MCG (2000 UT) CAPS Take 1 capsule by mouth daily. 30 capsule 2   FLUoxetine (PROZAC) 20 MG capsule Take 1 capsule (20 mg total) by mouth daily. 90 capsule 1   Fluticasone-Umeclidin-Vilant (TRELEGY  ELLIPTA) 200-62.5-25 MCG/ACT AEPB Inhale 1 puff into the lungs daily. 60 each 2   ibuprofen (ADVIL) 200 MG tablet Take 600 mg by mouth every 6 (six) hours as needed for moderate pain.     OVER THE COUNTER MEDICATION Take 1 capsule by mouth daily. Serrapeptase 120,000 units daily  Reported on 12/27/2015     rosuvastatin (CRESTOR) 5 MG tablet Take 1 tablet by mouth daily at bedtime ,except on Monday, Wednesday and Fridays take 2 tablets at bedtime as directed 180 tablet 3   Specialty Vitamins Products (MAGNESIUM, AMINO ACID CHELATE,) 133 MG tablet Take 1 tablet by mouth at bedtime.     amiodarone (PACERONE) 200 MG tablet Take 1 tablet (200 mg total) by mouth daily for 21 days. (Patient not taking:  Reported on 01/28/2023) 21 tablet 0   amiodarone (PACERONE) 400 MG tablet Take 1 tablet (400 mg total) by mouth 2 (two) times daily for 7 days. (Patient not taking: Reported on 02/13/2023) 14 tablet 0   apixaban (ELIQUIS) 5 MG TABS tablet Take 1 tablet (5 mg total) by mouth 2 (two) times daily. (Patient not taking: Reported on 02/13/2023) 60 tablet 1   oxyCODONE-acetaminophen (PERCOCET/ROXICET) 5-325 MG tablet Take 1 tablet by mouth every 6 (six) hours as needed for moderate pain. (Patient not taking: Reported on 02/13/2023) 20 tablet 0   [DISCONTINUED] hydrochlorothiazide (HYDRODIURIL) 25 MG tablet Take 1 tablet (25 mg total) by mouth daily. 30 tablet 6   [DISCONTINUED] lisinopril (ZESTRIL) 5 MG tablet Take 1 tablet (5 mg total) by mouth daily. 30 tablet 1   No current facility-administered medications on file prior to visit.    Allergies:   Lisinopril and Lipitor [atorvastatin]   Social History   Socioeconomic History   Marital status: Married    Spouse name: Not on file   Number of children: 2   Years of education: Not on file   Highest education level: Bachelor's degree (e.g., BA, AB, BS)  Occupational History   Occupation: OR Optician, dispensing: [employer]    Comment: Retired  Tobacco Use    Smoking status: Former    Packs/day: 1.00    Years: 40.00    Total pack years: 40.00    Types: Cigarettes    Quit date: 04/01/2022    Years since quitting: 0.8   Smokeless tobacco: Never  Vaping Use   Vaping Use: Former   Devices: zero nicotine vape  Substance and Sexual Activity   Alcohol use: Yes    Alcohol/week: 6.0 standard drinks of alcohol    Types: 6 Glasses of wine per week    Comment: social 4-6 glasses wine weekly per pt   Drug use: No   Sexual activity: Not Currently    Birth control/protection: Post-menopausal, Surgical    Comment: BTL  Other Topics Concern   Not on file  Social History Narrative   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  She used to work at [employer].     Has 2 daughters.   Quit smoking 04/01/22.   Drinks 4 to 6 cups of coffee a day.      Currently trying Intermittent Fasting diet for weight loss.  Does not routinely exercise.   Social Determinants of Health   Financial Resource Strain: Low Risk  (02/21/2022)   Overall Financial Resource Strain (CARDIA)    Difficulty of Paying Living Expenses: Not hard at all  Food Insecurity: No Food Insecurity (01/12/2023)   Hunger Vital Sign    Worried About Running Out of Food in the Last Year: Never true    Ran Out of Food in the Last Year: Never true  Transportation Needs: No Transportation Needs (01/12/2023)   PRAPARE - Hydrologist (Medical): No    Lack of Transportation (Non-Medical): No  Physical Activity: Not on file  Stress: Not on file  Social Connections: Not on file     Family History: The patient's family history includes Breast cancer in her niece and paternal aunt; Cancer - Other in her father; Healthy in her brother, brother, and sister; Hyperlipidemia in her father; Hypertension in her father and sister; Liver cancer in her maternal grandfather; Lung cancer  in her brother and maternal  grandmother; Parkinson's disease in her father; Prostate cancer in her father; Skin cancer in her maternal aunt; Transient ischemic attack (age of onset: 52) in her mother.  ROS:   Please see the history of present illness.     All other systems reviewed and are negative.  EKGs/Labs/Other Studies Reviewed:    The following studies were reviewed today:   EKG:     Recent Labs: 01/02/2023: BNP 61.7 01/14/2023: Magnesium 1.8 02/07/2023: ALT 14; BUN 16; Creatinine 0.75; Hemoglobin 10.8; Platelet Count 120; Potassium 4.1; Sodium 136   Recent Lipid Panel    Component Value Date/Time   CHOL 181 01/02/2023 0830   TRIG 85 01/02/2023 0830   HDL 87 01/02/2023 0830   CHOLHDL 2.1 01/02/2023 0830   LDLCALC 79 01/02/2023 0830     Risk Assessment/Calculations:    CHA2DS2-VASc Score = 3   This indicates a 3.2% annual risk of stroke. The patient's score is based upon: CHF History: 0 HTN History: 1 Diabetes History: 0 Stroke History: 0 Vascular Disease History: 0 Age Score: 1 Gender Score: 1      Physical Exam:    VS:  Vitals:   02/13/23 0801  BP: 112/62  Pulse: (!) 53  SpO2: 90%     Wt Readings from Last 3 Encounters:  02/13/23 156 lb (70.8 kg)  02/07/23 156 lb 4.8 oz (70.9 kg)  01/28/23 154 lb 11.2 oz (70.2 kg)     GEN:  Well nourished, well developed in no acute distress HEENT: Normal NECK: No JVD; No carotid bruits LYMPHATICS: No lymphadenopathy CARDIAC: RRR, no murmurs, rubs, gallops RESPIRATORY:  Clear to auscultation without rales, wheezing or rhonchi  ABDOMEN: Soft, non-tender, non-distended. Well healed incision MUSCULOSKELETAL:  No edema; No deformity  SKIN: Warm and dry NEUROLOGIC:  Alert and oriented x 3 PSYCHIATRIC:  Normal affect   ASSESSMENT:   Cardio Onc: on her 2 since 03/2022-planned until September, 2024 (switched therapy in September 2023; was on pertuzumab and trastuzumab without great response to therapy; changed to [place] in September).  Started cardioprotective agent for HTN, coreg in January 2024. GLS is normal. EF is preserved. - She can continue her2 therapy.  -Will plan to schedule her echoes , next 03/2023 - Baseline BNP was 61  Post-Afib: discussed risk/benefits of stopping AC. We decided that she can stop eliquis, but she can let us know if she develops symptoms of afib - continue asa 81 mg daily - s/p eliquis with chemical conversion - s/p amiodarone  Elevated CAC: continue crestor 5 mg (does 10 mg some days).  - started zetia 10 mg daily - LDL 79 mg/dL, not at goal  HTN: well controlled. Continue coreg 6.25 mg BID and norvasc 10 mg daily. BP goal <130/80 mmHg  Infrarenal AA: up to 5.7 cm 12/21/2022; followed by vascular sx. She is planned for open repair 01/11/2023. Noted that she was acceptable risk. She is now s/p open AAA repair 2/2 juctarenal 5.7cm AAA on 2.9.2024.  Smoking: stopped smoking breast cancer diagnosis  PLAN:    In order of problems listed above:   Start zetia 10 mg daily Follow up 6 months      Medication Adjustments/Labs and Tests Ordered: Current medicines are reviewed at length with the patient today.  Concerns regarding medicines are outlined above.  No orders of the defined types were placed in this encounter.  Meds ordered this encounter  Medications   ezetimibe (ZETIA) 10 MG tablet  Sig: Take 1 tablet (10 mg total) by mouth daily.    Dispense:  90 tablet    Refill:  3    Patient Instructions  Medication Instructions:   START Zetia 10 mg daily   *If you need a refill on your cardiac medications before your next appointment, please call your pharmacy*  Lab Work: NONE ordered at this time of appointment   If you have labs (blood work) drawn today and your tests are completely normal, you will receive your results only by: [place] (if you have MyChart) OR A paper copy in the mail If you have any lab test that is abnormal or we need to change your treatment, we  will call you to review the results.  Testing/Procedures: NONE ordered at this time of appointment   Follow-Up: At [facility], you and your health needs are our priority.  As part of our continuing mission to provide you with exceptional heart care, we have created designated Provider Care Teams.  These Care Teams include your primary Cardiologist (physician) and Advanced Practice Providers (APPs -  Physician Assistants and Nurse Practitioners) who all work together to provide you with the care you need, when you need it.    Your next appointment:   6 month(s)  Provider:   Janina Mayo, MD     Other Instructions     Signed, Janina Mayo, MD  02/13/2023 8:25 AM    [place]

## 2023-02-13 NOTE — Patient Instructions (Signed)
Medication Instructions:   START Zetia 10 mg daily   *If you need a refill on your cardiac medications before your next appointment, please call your pharmacy*  Lab Work: NONE ordered at this time of appointment   If you have labs (blood work) drawn today and your tests are completely normal, you will receive your results only by: [place] (if you have MyChart) OR A paper copy in the mail If you have any lab test that is abnormal or we need to change your treatment, we will call you to review the results.  Testing/Procedures: NONE ordered at this time of appointment   Follow-Up: At [facility], you and your health needs are our priority.  As part of our continuing mission to provide you with exceptional heart care, we have created designated Provider Care Teams.  These Care Teams include your primary Cardiologist (physician) and Advanced Practice Providers (APPs -  Physician Assistants and Nurse Practitioners) who all work together to provide you with the care you need, when you need it.    Your next appointment:   6 month(s)  Provider:   Janina Mayo, MD     Other Instructions

## 2023-02-18 ENCOUNTER — Encounter: Payer: Self-pay | Admitting: *Deleted

## 2023-02-19 ENCOUNTER — Encounter: Payer: Self-pay | Admitting: Hematology

## 2023-02-26 ENCOUNTER — Other Ambulatory Visit: Payer: Self-pay | Admitting: Hematology

## 2023-02-27 ENCOUNTER — Other Ambulatory Visit (HOSPITAL_COMMUNITY): Payer: Self-pay

## 2023-02-27 ENCOUNTER — Other Ambulatory Visit: Payer: Self-pay

## 2023-02-27 MED ORDER — ANASTROZOLE 1 MG PO TABS
1.0000 mg | ORAL_TABLET | Freq: Every day | ORAL | 2 refills | Status: DC
  Filled 2023-02-27: qty 30, 30d supply, fill #0
  Filled 2023-04-03: qty 30, 30d supply, fill #1
  Filled 2023-04-29: qty 30, 30d supply, fill #2

## 2023-02-28 ENCOUNTER — Inpatient Hospital Stay: Payer: PPO

## 2023-02-28 ENCOUNTER — Other Ambulatory Visit (HOSPITAL_COMMUNITY): Payer: Self-pay

## 2023-02-28 VITALS — BP 133/86 | HR 56 | Temp 98.0°F | Resp 18 | Ht 69.5 in | Wt 159.0 lb

## 2023-02-28 DIAGNOSIS — Z170 Estrogen receptor positive status [ER+]: Secondary | ICD-10-CM

## 2023-02-28 DIAGNOSIS — Z5112 Encounter for antineoplastic immunotherapy: Secondary | ICD-10-CM | POA: Diagnosis not present

## 2023-02-28 DIAGNOSIS — Z95828 Presence of other vascular implants and grafts: Secondary | ICD-10-CM

## 2023-02-28 LAB — CMP (CANCER CENTER ONLY)
ALT: 20 U/L (ref 0–44)
AST: 33 U/L (ref 15–41)
Albumin: 3.6 g/dL (ref 3.5–5.0)
Alkaline Phosphatase: 127 U/L — ABNORMAL HIGH (ref 38–126)
Anion gap: 6 (ref 5–15)
BUN: 12 mg/dL (ref 8–23)
CO2: 28 mmol/L (ref 22–32)
Calcium: 9.1 mg/dL (ref 8.9–10.3)
Chloride: 102 mmol/L (ref 98–111)
Creatinine: 0.74 mg/dL (ref 0.44–1.00)
GFR, Estimated: >60 mL/min (ref >60)
Glucose, Bld: 112 mg/dL — ABNORMAL HIGH (ref 70–99)
Potassium: 4.4 mmol/L (ref 3.5–5.1)
Sodium: 136 mmol/L (ref 135–145)
Total Bilirubin: 0.4 mg/dL (ref 0.3–1.2)
Total Protein: 7.2 g/dL (ref 6.5–8.1)

## 2023-02-28 LAB — CBC WITH DIFFERENTIAL (CANCER CENTER ONLY)
Abs Immature Granulocytes: 0.01 10*3/uL (ref 0.00–0.07)
Basophils Absolute: 0 10*3/uL (ref 0.0–0.1)
Basophils Relative: 1 %
Eosinophils Absolute: 0.1 10*3/uL (ref 0.0–0.5)
Eosinophils Relative: 3 %
HCT: 32.4 % — ABNORMAL LOW (ref 36.0–46.0)
Hemoglobin: 10.6 g/dL — ABNORMAL LOW (ref 12.0–15.0)
Immature Granulocytes: 0 %
Lymphocytes Relative: 22 %
Lymphs Abs: 0.7 10*3/uL (ref 0.7–4.0)
MCH: 30.4 pg (ref 26.0–34.0)
MCHC: 32.7 g/dL (ref 30.0–36.0)
MCV: 92.8 fL (ref 80.0–100.0)
Monocytes Absolute: 0.5 10*3/uL (ref 0.1–1.0)
Monocytes Relative: 15 %
Neutro Abs: 1.9 10*3/uL (ref 1.7–7.7)
Neutrophils Relative %: 59 %
Platelet Count: 86 10*3/uL — ABNORMAL LOW (ref 150–400)
RBC: 3.49 MIL/uL — ABNORMAL LOW (ref 3.87–5.11)
RDW: 14.8 % (ref 11.5–15.5)
WBC Count: 3.2 10*3/uL — ABNORMAL LOW (ref 4.0–10.5)
nRBC: 0 % (ref 0.0–0.2)

## 2023-02-28 MED ORDER — CETIRIZINE HCL 10 MG/ML IV SOLN
10.0000 mg | Freq: Once | INTRAVENOUS | Status: AC
  Administered 2023-02-28: 10 mg via INTRAVENOUS
  Filled 2023-02-28: qty 1

## 2023-02-28 MED ORDER — ACETAMINOPHEN 325 MG PO TABS
650.0000 mg | ORAL_TABLET | Freq: Once | ORAL | Status: AC
  Administered 2023-02-28: 650 mg via ORAL
  Filled 2023-02-28: qty 2

## 2023-02-28 MED ORDER — SODIUM CHLORIDE 0.9 % IV SOLN: Freq: Once | INTRAVENOUS | Status: AC

## 2023-02-28 MED ORDER — SODIUM CHLORIDE 0.9% FLUSH
10.0000 mL | Freq: Once | INTRAVENOUS | Status: AC
  Administered 2023-02-28: 10 mL

## 2023-02-28 MED ORDER — SODIUM CHLORIDE 0.9 % IV SOLN
3.0000 mg/kg | Freq: Once | INTRAVENOUS | Status: AC
  Administered 2023-02-28: 200 mg via INTRAVENOUS
  Filled 2023-02-28: qty 6

## 2023-02-28 MED ORDER — PROCHLORPERAZINE MALEATE 10 MG PO TABS
10.0000 mg | ORAL_TABLET | Freq: Once | ORAL | Status: AC
  Administered 2023-02-28: 10 mg via ORAL
  Filled 2023-02-28: qty 1

## 2023-02-28 MED ORDER — SODIUM CHLORIDE 0.9% FLUSH
10.0000 mL | INTRAVENOUS | Status: DC | PRN
  Administered 2023-02-28: 10 mL

## 2023-02-28 MED ORDER — HEPARIN SOD (PORK) LOCK FLUSH 100 UNIT/ML IV SOLN
500.0000 [IU] | Freq: Once | INTRAVENOUS | Status: AC | PRN
  Administered 2023-02-28: 500 [IU]

## 2023-02-28 NOTE — Progress Notes (Signed)
Platelets 89. Lacie NP notified. Kadcyla Dose reduced for D1/C9

## 2023-02-28 NOTE — Progress Notes (Signed)
Per Regan Rakers, will reduce kadcyla dose today to 3 mg/kg.  Larene Beach, PharmD

## 2023-02-28 NOTE — Patient Instructions (Signed)
[facility]  Discharge Instructions: Thank you for choosing [facility] to provide your oncology and hematology care.   If you have a lab appointment with the Cancer Center, please go directly to the Cancer Center and check in at the registration area.   Wear comfortable clothing and clothing appropriate for easy access to any Portacath or PICC line.   We strive to give you quality time with your provider. You may need to reschedule your appointment if you arrive late (15 or more minutes).  Arriving late affects you and other patients whose appointments are after yours.  Also, if you miss three or more appointments without notifying the office, you may be dismissed from the clinic at the provider's discretion.      For prescription refill requests, have your pharmacy contact our office and allow 72 hours for refills to be completed.    Today you received the following chemotherapy and/or immunotherapy agents Kadcyla      To help prevent nausea and vomiting after your treatment, we encourage you to take your nausea medication as directed.  BELOW ARE SYMPTOMS THAT SHOULD BE REPORTED IMMEDIATELY: *FEVER GREATER THAN 100.4 F (38 C) OR HIGHER *CHILLS OR SWEATING *NAUSEA AND VOMITING THAT IS NOT CONTROLLED WITH YOUR NAUSEA MEDICATION *UNUSUAL SHORTNESS OF BREATH *UNUSUAL BRUISING OR BLEEDING *URINARY PROBLEMS (pain or burning when urinating, or frequent urination) *BOWEL PROBLEMS (unusual diarrhea, constipation, pain near the anus) TENDERNESS IN MOUTH AND THROAT WITH OR WITHOUT PRESENCE OF ULCERS (sore throat, sores in mouth, or a toothache) UNUSUAL RASH, SWELLING OR PAIN  UNUSUAL VAGINAL DISCHARGE OR ITCHING   Items with * indicate a potential emergency and should be followed up as soon as possible or go to the Emergency Department if any problems should occur.  Please show the CHEMOTHERAPY ALERT CARD or IMMUNOTHERAPY ALERT CARD at check-in  to the Emergency Department and triage nurse.  Should you have questions after your visit or need to cancel or reschedule your appointment, please contact [facility]  Dept: [phone]  and follow the prompts.  Office hours are 8:00 a.m. to 4:30 p.m. Monday - Friday. Please note that voicemails left after 4:00 p.m. may not be returned until the following business day.  We are closed weekends and major holidays. You have access to a nurse at all times for urgent questions. Please call the main number to the clinic Dept: [phone] and follow the prompts.   For any non-urgent questions, you may also contact your provider using MyChart. We now offer e-Visits for anyone 18 and older to request care online for non-urgent symptoms. For details visit [email or web address].   Also download the MyChart app! Go to the app store, search "MyChart", open the app, select [place], and log in with your MyChart username and password.  Masks are optional in the cancer centers. If you would like for your care team to wear a mask while they are taking care of you, please let them know. You may have one support person who is at least 68 years old accompany you for your appointments.  Ado-Trastuzumab Emtansine Injection What is this medication? ADO-TRASTUZUMAB EMTANSINE (ADD oh traz TOO zuh mab em TAN zine) treats breast cancer. It works by blocking a protein that causes cancer cells to grow and multiply. This helps to slow or stop the spread of cancer cells. This medicine may be used for other purposes; ask your health   care provider or pharmacist if you have questions. COMMON BRAND NAME(S): Kadcyla What should I tell my care team before I take this medication? They need to know if you have any of these conditions: Heart failure Liver disease Low platelet levels Lung disease Tingling of the fingers or toes or other nerve disorder An unusual or allergic reaction to  ado-trastuzumab emtansine, other medications, foods, dyes, or preservatives Pregnant or trying to get pregnant Breast-feeding How should I use this medication? This medication is infused into a vein. It is given by your care team in a hospital or clinic setting. Talk to your care team about the use of this medication in children. Special care may be needed. Overdosage: If you think you have taken too much of this medicine contact a poison control center or emergency room at once. NOTE: This medicine is only for you. Do not share this medicine with others. What if I miss a dose? Keep appointments for follow-up doses. It is important not to miss your dose. Call your care team if you are unable to keep an appointment. What may interact with this medication? Atazanavir Boceprevir Clarithromycin Dalfopristin; quinupristin Delavirdine Indinavir Isoniazid, INH Itraconazole Ketoconazole Nefazodone Nelfinavir Ritonavir Telaprevir Telithromycin Tipranavir Voriconazole This list may not describe all possible interactions. Give your health care provider a list of all the medicines, herbs, non-prescription drugs, or dietary supplements you use. Also tell them if you smoke, drink alcohol, or use illegal drugs. Some items may interact with your medicine. What should I watch for while using this medication? This medication may make you feel generally unwell. This is not uncommon, as chemotherapy can affect healthy cells as well as cancer cells. Report any side effects. Continue your course of treatment even though you feel ill unless your care team tells you to stop. You may need blood work while taking this medication. This medication may increase your risk to bruise or bleed. Call your care team if you notice any unusual bleeding. Be careful brushing or flossing your teeth or using a toothpick because you may get an infection or bleed more easily. If you have any dental work done, tell your dentist  you are receiving this medication. Talk to your care team if you may be pregnant. Serious birth defects can occur if you take this medication during pregnancy and for 7 months after the last dose. You will need a negative pregnancy test before starting this medication. Contraception is recommended while taking this medication and for 7 months after the last dose. Your care team can help you find the option that works for you. If your partner can get pregnant, use a condom during sex while taking this medication and for 4 months after the last dose. Do not breastfeed while taking this medication and for 7 months after the last dose. This medication may cause infertility. Talk to your care team if you are concerned with your fertility. What side effects may I notice from receiving this medication? Side effects that you should report to your care team as soon as possible: Allergic reactions--skin rash, itching, hives, swelling of the face, lips, tongue, or throat Bleeding--bloody or black, tar-like stools, vomiting blood or brown material that looks like coffee grounds, red or dark brown urine, small red or purple spots on skin, unusual bruising or bleeding Dry cough, shortness of breath or trouble breathing Heart failure--shortness of breath, swelling of the ankles, feet, or hands, sudden weight gain, unusual weakness or fatigue Infusion reactions--chest pain, shortness of   breath or trouble breathing, feeling faint or lightheaded Liver injury--right upper belly pain, loss of appetite, nausea, light-colored stool, dark yellow or brown urine, yellowing skin or eyes, unusual weakness or fatigue Pain, tingling, or numbness in the hands or feet Painful swelling, warmth, or redness of the skin, blisters or sores at the infusion site Side effects that usually do not require medical attention (report to your care team if they continue or are bothersome): Constipation Fatigue Headache Muscle  pain Nausea This list may not describe all possible side effects. Call your doctor for medical advice about side effects. You may report side effects to FDA at 1-800-FDA-1088. Where should I keep my medication? This medication is given in a hospital or clinic. It will not be stored at home. NOTE: This sheet is a summary. It may not cover all possible information. If you have questions about this medicine, talk to your doctor, pharmacist, or health care provider.  2023 Elsevier/Gold Standard (2022-04-06 00:00:00) 

## 2023-03-04 ENCOUNTER — Telehealth: Payer: Self-pay

## 2023-03-04 NOTE — Patient Outreach (Signed)
  Care Coordination   Initial Visit Note   03/04/2023 Name: Ashley Robertson MRN: [id number] DOB: 1956-01-22  Ashley Robertson is a 67 y.o. year old female who sees Ashley Caraway, MD for primary care. I spoke with  Ashley Robertson by phone today.  What matters to the patients health and wellness today?  Placed call to patient today to review and offer [facility] care coordination program.  Patient reports that she is doing well and denies any needs today.       SDOH assessments and interventions completed:  No     Care Coordination Interventions:  No, not indicated   Follow up plan: No further intervention required.   Encounter Outcome:  Pt. Refused   Tomasa Rand, RN, BSN, CEN [facility] [employer] [phone]

## 2023-03-05 ENCOUNTER — Other Ambulatory Visit: Payer: Self-pay

## 2023-03-14 DIAGNOSIS — L814 Other melanin hyperpigmentation: Secondary | ICD-10-CM | POA: Diagnosis not present

## 2023-03-14 DIAGNOSIS — L821 Other seborrheic keratosis: Secondary | ICD-10-CM | POA: Diagnosis not present

## 2023-03-14 DIAGNOSIS — Z85828 Personal history of other malignant neoplasm of skin: Secondary | ICD-10-CM | POA: Diagnosis not present

## 2023-03-14 DIAGNOSIS — D225 Melanocytic nevi of trunk: Secondary | ICD-10-CM | POA: Diagnosis not present

## 2023-03-14 DIAGNOSIS — Z08 Encounter for follow-up examination after completed treatment for malignant neoplasm: Secondary | ICD-10-CM | POA: Diagnosis not present

## 2023-03-18 ENCOUNTER — Ambulatory Visit (HOSPITAL_COMMUNITY)
Admission: RE | Admit: 2023-03-18 | Discharge: 2023-03-18 | Disposition: A | Discharge status: Home / Self Care | Payer: PPO | Source: Ambulatory Visit | Attending: Internal Medicine | Admitting: Internal Medicine

## 2023-03-18 DIAGNOSIS — C50811 Malignant neoplasm of overlapping sites of right female breast: Secondary | ICD-10-CM

## 2023-03-18 DIAGNOSIS — Z01818 Encounter for other preprocedural examination: Secondary | ICD-10-CM | POA: Diagnosis not present

## 2023-03-18 DIAGNOSIS — Z0189 Encounter for other specified special examinations: Secondary | ICD-10-CM | POA: Diagnosis not present

## 2023-03-18 DIAGNOSIS — E785 Hyperlipidemia, unspecified: Secondary | ICD-10-CM | POA: Diagnosis not present

## 2023-03-18 DIAGNOSIS — I10 Essential (primary) hypertension: Secondary | ICD-10-CM | POA: Diagnosis not present

## 2023-03-18 DIAGNOSIS — Z170 Estrogen receptor positive status [ER+]: Secondary | ICD-10-CM | POA: Diagnosis not present

## 2023-03-18 LAB — ECHOCARDIOGRAM LIMITED
AV Mean grad: 6 mmHg
AV Peak grad: 11.4 mmHg
Ao pk vel: 1.69 m/s
Area-P 1/2: 5.02 cm2
Calc EF: 54.8 %
Est EF: 55
S' Lateral: 4.1 cm
Single Plane A2C EF: 57.2 %
Single Plane A4C EF: 54.2 %

## 2023-03-18 NOTE — Progress Notes (Signed)
  Echocardiogram 2D Echocardiogram has been performed.  Ashley Robertson 03/18/2023, 8:37 AM

## 2023-03-20 NOTE — Assessment & Plan Note (Signed)
-  Her most recent DEXA was on 04/29/20 showing osteopenia (T-score of -2.1 at L1-3). -continue calcium and vitD  -We discussed the negative impact on bone density from aromatase inhibitor -I previously recommended her to consider Zometa infusion every 6 months for 2 years, benefit and side effect discussed with her. She will think about it    

## 2023-03-20 NOTE — Progress Notes (Unsigned)
[facility]   Telephone:(336) [phone] Fax:(336) [phone]   Clinic Follow up Note   Patient Care Team: Gweneth Dimitri, MD as PCP - General (Family Medicine) Marykay Lex, MD as PCP - Cardiology (Cardiology) Pershing Proud, RN as Oncology Nurse Navigator Donnelly Angelica, RN as Oncology Nurse Navigator Emelia Loron, MD as Consulting Physician (General Surgery) Malachy Mood, MD as Consulting Physician (Hematology) Dorothy Puffer, MD as Consulting Physician (Radiation Oncology)  Date of Service:  03/21/2023  CHIEF COMPLAINT: f/u of right breast cancer    CURRENT THERAPY:   Kadcyla, q21d, started 08/24/22, plan for a total of 14 cycles  Anastrozole started in 12/2022  ASSESSMENT:  Ashley Robertson is a 67 y.o. female with   Malignant neoplasm of overlapping sites of right breast in female, estrogen receptor positive (HCC) invasive lobular carcinoma, Stage IIIA, c(T3, N1), yp(T3, N1a), ER+/PR-/HER2+, Grade 2  -Diagnosed in 01/2022 -s/p neoadjuvant chemo TCHP X6, followed by right mastectomy by Dr. Dwain Sarna and reconstruction by Dr. Leta Baptist. Unfortunately she did not have much response to neoadjvuant chemo  -on adjuvant Kadcyla now, tolerating well with no noticeable side effects. Plan for a total of 14 treatment -she received postmastectomy radiation under Dr. Mitzi Hansen, 10/10 - 10/29/22. -she started adjuvant anastrozole in January 2024, she is tolerating very well -Echocardiogram in January 2024 showed slightly decreased EF 50 to 55%.  She was seen by cardiologist after echo, her cardiac medication was adjusted, and we will continue HER2 antibody.  repeated echo on 03/18/23 showed normal EF 55% -Lab reviewed, she has moderate thrombocytopenia with platelet to 68 today, Kadcyla dose reduced last cycle due to thrombocytopenia.  I will continue same dose at this 3 mg/kg, okay to treat today.  Will postpone her next treatment for week.  Osteopenia -Her most recent DEXA was  on 04/29/20 showing osteopenia (T-score of -2.1 at L1-3). -continue calcium and vitD  -We discussed the negative impact on bone density from aromatase inhibitor -I previously recommended her to consider Zometa infusion every 6 months for 2 years, benefit and side effect discussed with her. She will think about it    Anemia and thrombocytopenia -Secondary to chemotherapy -Will monitor closely.    PLAN: -lab reviewed hg 10.4,   platelet count 68K -proceed with C10 Kadcyla at same reduce dose due to low platelet count -[facility]ete lab in 2 weeks -Postpone her next treatment for 1 week to allow her blood counts recover -Follow-up in 4 weeks before next treatment.  SUMMARY OF ONCOLOGIC HISTORY: Oncology History Overview Note   Cancer Staging  Malignant neoplasm of overlapping sites of right breast in female, estrogen receptor positive (HCC) Staging form: Breast, AJCC 8th Edition - Clinical stage from 02/09/2022: Stage IIA (cT2, cN1, cM0, G2, ER+, PR-, HER2+) - Signed by Malachy Mood, MD on 02/20/2022    Malignant neoplasm of overlapping sites of right breast in female, estrogen receptor positive  02/08/2022 Mammogram   CLINICAL DATA:  Acute onset right breast lump.  EXAM: DIGITAL DIAGNOSTIC UNILATERAL RIGHT MAMMOGRAM WITH TOMOSYNTHESIS AND CAD; ULTRASOUND RIGHT BREAST LIMITED  IMPRESSION: 1. Highly suspicious findings mammographically and sonographically. I suspect there is significant malignancy throughout most of the right breast. Discrete masses are seen at 11 o'clock and 6 o'clock.  A single borderline lymph node is identified. This lymph node appears prominent compared to the remainder of the lymph nodes. The skin thickening suggests the possibility of inflammatory breast cancer.   02/09/2022 Cancer Staging   Staging form:  Breast, AJCC 8th Edition - Clinical stage from 02/09/2022: Stage IIIA (cT3, cN1, cM0, G2, ER+, PR-, HER2+) - Signed by Malachy Mood, MD on 03/05/2022 Stage prefix: Initial  diagnosis Histologic grading system: 3 grade system   02/09/2022 Initial Biopsy   Diagnosis 1. Breast, right, needle core biopsy, right breast 11 o'clock mass ribbon clip - INVASIVE MAMMARY CARCINOMA - SEE COMMENT 2. Breast, right, needle core biopsy, right breast 6'oclock mass, coil clip - INVASIVE MAMMARY CARCINOMA - SEE COMMENT 3. Lymph node, needle/core biopsy, right axillary lymph node, tribell clip - METASTATIC CARCINOMA INVOLVING NODAL TISSUE Microscopic Comment 1. and 2. The biopsy material shows an infiltrative proliferation of cells arranged linearly and in small clusters. Based on the biopsy, the carcinoma appears Nottingham grade 2 of 3 and measures 1.5 cm in greatest linear extent.  Addendum parts 1 and 2: Immunohistochemistry for E-cadherin is negative consistent with lobular carcinoma.  2. PROGNOSTIC INDICATORS Results: The tumor cells are EQUIVOCAL for Her2 (2+). Her2 by FISH will be performed and the results reported separately. Estrogen Receptor: 100%, POSITIVE, STRONG STAINING INTENSITY Progesterone Receptor: <1%, NEGATIVE Proliferation Marker Ki67: 10%  2. FLUORESCENCE IN-SITU HYBRIDIZATION Results: GROUP 1: HER2 **POSITIVE**   3. PROGNOSTIC INDICATORS Results: By immunohistochemistry, the tumor cells are POSITIVE for Her2 (3+). Estrogen Receptor: 100%, POSITIVE, STRONG STAINING INTENSITY Progesterone Receptor: <1%, NEGATIVE   02/16/2022 Initial Diagnosis   Malignant neoplasm of overlapping sites of right breast in female, estrogen receptor positive (HCC)   03/01/2022 Imaging   EXAM: BILATERAL BREAST MRI WITH AND WITHOUT CONTRAST  IMPRESSION: 1. 8.5 x 8.3 x 5.9 cm area of confluent mass-like enhancement in the right breast involving all 4 quadrants and containing 2 biopsy marker clip artifacts, compatible with 4 quadrant biopsy-proven malignancy. 2. Biopsy-proven metastatic lymph node in the right axilla. 3. Possible metastatic intramammary lymph node  in the posterior outer right breast just above the level of the nipple. 4. No evidence of malignancy on the left.   03/01/2022 Genetic Testing   Negative hereditary cancer genetic testing: no pathogenic variants detected in [facility] CustomNext-Cancer +RNAinsight Panel.  Variant of uncertain significance reported in BRIP1 at p.F934V (c.2800T>G). Report date is March 01, 2022.    The CustomNext-Cancer+RNAinsight panel offered by [employer] includes sequencing and rearrangement analysis for the following 47 genes:  APC, ATM, AXIN2, BARD1, BMPR1A, BRCA1, BRCA2, BRIP1, CDH1, CDK4, CDKN2A, CHEK2, DICER1, EPCAM, GREM1, HOXB13, MEN1, MLH1, MSH2, MSH3, MSH6, MUTYH, NBN, NF1, NF2, NTHL1, PALB2, PMS2, POLD1, POLE, PTEN, RAD51C, RAD51D, RECQL, RET, SDHA, SDHAF2, SDHB, SDHC, SDHD, SMAD4, SMARCA4, STK11, TP53, TSC1, TSC2, and VHL.  RNA data is routinely analyzed for use in variant interpretation for all genes.   03/05/2022 Imaging   EXAM: CT CHEST, ABDOMEN, AND PELVIS WITH CONTRAST  IMPRESSION: 1. Asymmetric, masslike density of the glandular tissue of the right breast with overlying skin thickening, in keeping with known primary breast malignancy. 2. Enlarged, ill-defined right axillary lymph node containing a biopsy marking clip, consistent with known nodal metastatic disease. 3. No other evidence of lymphadenopathy or metastatic disease in the chest, abdomen, or pelvis. 4. Emphysema. Background of fine centrilobular nodularity, most concentrated in the lung apices, consistent with smoking-related respiratory bronchiolitis. 5. Aneurysm of the infrarenal abdominal aorta measuring up to 5.4 x 5.3 cm with a large burden of eccentric mural thrombus. Recommend follow-up CT/MR every 6 months and vascular consultation. This recommendation follows ACR consensus guidelines: White Paper of the ACR Incidental Findings Committee II on Vascular Findings. J  Am Coll Radiol 2013; 10:789-794. 6. Coronary artery disease.    03/05/2022 Imaging   EXAM: NUCLEAR MEDICINE WHOLE BODY BONE SCAN  IMPRESSION: No evidence of bony metastatic disease.   03/07/2022 - 08/01/2022 Chemotherapy   Patient is on Treatment Plan : BREAST  Docetaxel + Carboplatin + Trastuzumab + Pertuzumab  (TCHP) q21d      07/26/2022 Surgery   Right mastectomy with axillary node seed guided excision converted to axillary node dissection with reconstruction by Drs. Dwain Sarna and Thimmappa   07/26/2022 Pathology Results   FINAL MICROSCOPIC DIAGNOSIS:   A. LYMPH NODE, RIGHT AXILLARY TARGETED, EXCISION:  - Metastatic carcinoma involving one lymph node, 2 cm (1/1).  - Focal extranodal extension.  - Biopsy site and biopsy clip.   B. BREAST, RIGHT, MASTECTOMY:  - Invasive lobular carcinoma, 9.5 cm (ypT3).  - Carcinoma involves dermis of nipple.  - All surgical margins negative for carcinoma.  - One lymph node negative for metastatic carcinoma (0/1).  - Biopsy sites and biopsy clips.  - See oncology table.   C. LYMPH NODE, RIGHT AXILLARY, SENTINEL, EXCISION:  - One lymph node negative for metastatic carcinoma (0/1).   D. LYMPH NODE, RIGHT AXILLARY, SENTINEL, EXCISION:  - Metastatic carcinoma in one lymph node, 0.5 cm. (1/1).   E. LYMPH NODE, RIGHT AXILLARY, SENTINEL, EXCISION:  - One lymph node negative for metastatic carcinoma (0/1).    07/26/2022 Cancer Staging   Cancer Staging  Malignant neoplasm of overlapping sites of right breast in female, estrogen receptor positive (HCC) Staging form: Breast, AJCC 8th Edition - Clinical stage from 02/09/2022: Stage IIIA (cT3, cN1, cM0, G2, ER+, PR-, HER2+) - Signed by Malachy Mood, MD on 03/05/2022 Stage prefix: Initial diagnosis Histologic grading system: 3 grade system - Pathologic stage from 07/26/2022: Stage IIIA (pT3, pN1a, cM0, G2, ER+, PR-, HER2+) - Signed by Pollyann Samples, NP on 08/01/2022 Histologic grading system: 3 grade system    07/26/2022 Cancer Staging   Staging form: Breast, AJCC 8th  Edition - Pathologic stage from 07/26/2022: Stage IIIA (pT3, pN1a, cM0, G2, ER+, PR-, HER2+) - Signed by Pollyann Samples, NP on 08/01/2022 Histologic grading system: 3 grade system   08/24/2022 -  Chemotherapy   Patient is on Treatment Plan : BREAST ADO-Trastuzumab Emtansine (Kadcyla) q21d        INTERVAL HISTORY:  Ashley Robertson is here for a follow up of right breast cancer . She was last seen by me on 02/07/2023. She presents to the clinic alone. Pt report she is doing well.Pt denied having any fatigue , numbness and tingling, pain after treatment. Pt overall is tolerating treatment well.        All other systems were reviewed with the patient and are negative.  MEDICAL HISTORY:  Past Medical History:  Diagnosis Date   Allergy    Lisinopril and lipitor   Anxiety and depression    Aortic atherosclerosis (HCC) 03/2020   CT Chest: 2 V (LAD & LCx) Coronary Atherosclerosis, Aortic Atherosclerosis (no aneurysm).  Mild centrilobular emphysema with mild diffuse bronchial thickening; several scattered small solitary pulmonary nodules (largest 5.6 mm in anterior left upper lobe)   Breast cancer (HCC) 12/2021   Right breast ILC   Carotid artery plaque, bilateral 10/2015   Mild to moderate plaque L>R without significant stenosis   COPD (chronic obstructive pulmonary disease) (HCC)    Coronary Artery Calcification - Score 79    Coronary Calcium Score 79.  LAD and circumflex calcification noted.  Normal  ascending aorta with mild calcification.   Current every day smoker    pt quit smoking April 2023   Emphysema lung [facility])    Noted on chest CT   Family history of breast cancer 02/21/2022   Family history of prostate cancer 02/21/2022   Hyperlipidemia    Hypertension    Controlled with amlodipine   Prediabetes    Skin cancer 2021   Remove 2022    SURGICAL HISTORY: Past Surgical History:  Procedure Laterality Date   ABDOMINAL AORTIC ANEURYSM REPAIR N/A 01/11/2023   Procedure: ANEURYSM  ABDOMINAL AORTIC REPAIR;  Surgeon: Nada Libman, MD;  Location: [facility] OR;  Service: Vascular;  Laterality: N/A;   BREAST BIOPSY Right    times 3   BREAST RECONSTRUCTION WITH PLACEMENT OF TISSUE EXPANDER AND ALLODERM Right 07/26/2022   Procedure: RIGHT BREAST RECONSTRUCTION WITH PLACEMENT OF TISSUE EXPANDER AND ALLODERM;  Surgeon: Glenna Fellows, MD;  Location: [facility] OR;  Service: Plastics;  Laterality: Right;   CESAREAN SECTION  1990, 1991   COSMETIC SURGERY  1981   Rhinoplasty   MASTECTOMY Right    MASTECTOMY W/ SENTINEL NODE BIOPSY Right 07/26/2022   Procedure: RIGHT MASTECTOMY, RIGHT AXILLARY SENTINEL NODE BIOPSY;  Surgeon: Emelia Loron, MD;  Location: [facility] OR;  Service: General;  Laterality: Right;  GEN & PEC BLOCK   PORTACATH PLACEMENT Left 03/06/2022   Procedure: INSERTION PORT-A-CATH;  Surgeon: Emelia Loron, MD;  Location: [facility];  Service: General;  Laterality: Left;   RADIOACTIVE SEED GUIDED AXILLARY SENTINEL LYMPH NODE Right 07/26/2022   Procedure: RADIOACTIVE SEED GUIDED AXILLARY SENTINEL LYMPH NODE DISSECTION;  Surgeon: Emelia Loron, MD;  Location: [facility] OR;  Service: General;  Laterality: Right;   RHINOPLASTY  1981   TONSILLECTOMY  1978   TRANSTHORACIC ECHOCARDIOGRAM  10/2016   EF 55 to 60%.  Normal systolic and diastolic function.  No ASD/PFO   TUBAL LIGATION  1991    I have reviewed the social history and family history with the patient and they are unchanged from previous note.  ALLERGIES:  is allergic to lisinopril and lipitor [atorvastatin].  MEDICATIONS:  Current Outpatient Medications  Medication Sig Dispense Refill   albuterol (PROVENTIL HFA) 108 (90 Base) MCG/ACT inhaler Inhale 1 to 2 puffs into the lungs every 6 hours as needed 8.5 g 2   amLODipine (NORVASC) 10 MG tablet Take 1 tablet (10 mg total) by mouth at bedtime. 90 tablet 3   anastrozole (ARIMIDEX) 1 MG tablet Take 1 tablet (1 mg total) by mouth daily. 30 tablet 2    aspirin 81 MG tablet Take 81 mg by mouth at bedtime.     Calcium Carb-Cholecalciferol (CALCIUM 500 + D3 PO) Take 1 tablet by mouth daily.     carvedilol (COREG) 6.25 MG tablet Take 1 tablet (6.25 mg total) by mouth 2 (two) times daily. 60 tablet 3   Cholecalciferol (VITAMIN D) 50 MCG (2000 UT) CAPS Take 1 capsule by mouth daily. 30 capsule 2   ezetimibe (ZETIA) 10 MG tablet Take 1 tablet (10 mg total) by mouth daily. 90 tablet 3   FLUoxetine (PROZAC) 20 MG capsule Take 1 capsule (20 mg total) by mouth daily. 90 capsule 1   Fluticasone-Umeclidin-Vilant (TRELEGY ELLIPTA) 200-62.5-25 MCG/ACT AEPB Inhale 1 puff into the lungs daily. 60 each 2   ibuprofen (ADVIL) 200 MG tablet Take 600 mg by mouth every 6 (six) hours as needed for moderate pain.     OVER THE COUNTER MEDICATION Take 1 capsule  by mouth daily. Serrapeptase 120,000 units daily  Reported on 12/27/2015     rosuvastatin (CRESTOR) 5 MG tablet Take 1 tablet by mouth daily at bedtime ,except on Monday, Wednesday and Fridays take 2 tablets at bedtime as directed 180 tablet 3   Specialty Vitamins Products (MAGNESIUM, AMINO ACID CHELATE,) 133 MG tablet Take 1 tablet by mouth at bedtime.     No current facility-administered medications for this visit.    PHYSICAL EXAMINATION: ECOG PERFORMANCE STATUS: 0 - Asymptomatic  Vitals:   03/21/23 0934  BP: 125/63  Pulse: (!) 51  Resp: 18  Temp: 98.7 F (37.1 C)  SpO2: 94%   Wt Readings from Last 3 Encounters:  03/21/23 157 lb 12.8 oz (71.6 kg)  02/28/23 159 lb (72.1 kg)  02/13/23 156 lb (70.8 kg)     GENERAL:alert, no distress and comfortable SKIN: skin color normal, no rashes or significant lesions EYES: normal, Conjunctiva are pink and non-injected, sclera clear  NEURO: alert & oriented x 3 with fluent speech  LABORATORY DATA:  I have reviewed the data as listed    Latest Ref Rng & Units 03/21/2023    9:08 AM 02/28/2023    8:36 AM 02/07/2023    9:24 AM  CBC  WBC 4.0 - 10.5 K/uL 4.0   3.2  4.8   Hemoglobin 12.0 - 15.0 g/dL 16.1  09.6  04.5   Hematocrit 36.0 - 46.0 % 32.0  32.4  32.0   Platelets 150 - 400 K/uL 68  86  120         Latest Ref Rng & Units 03/21/2023    9:08 AM 02/28/2023    8:36 AM 02/07/2023    9:24 AM  CMP  Glucose 70 - 99 mg/dL 409  811  914   BUN 8 - 23 mg/dL 14  12  16    Creatinine 0.44 - 1.00 mg/dL 7.82  9.56  2.13   Sodium 135 - 145 mmol/L 136  136  136   Potassium 3.5 - 5.1 mmol/L 4.3  4.4  4.1   Chloride 98 - 111 mmol/L 101  102  100   CO2 22 - 32 mmol/L 28  28  29    Calcium 8.9 - 10.3 mg/dL 9.4  9.1  9.1   Total Protein 6.5 - 8.1 g/dL 7.6  7.2  7.2   Total Bilirubin 0.3 - 1.2 mg/dL 0.6  0.4  0.4   Alkaline Phos 38 - 126 U/L 181  127  126   AST 15 - 41 U/L 34  33  22   ALT 0 - 44 U/L 22  20  14        RADIOGRAPHIC STUDIES: I have personally reviewed the radiological images as listed and agreed with the findings in the report. No results found.    No orders of the defined types were placed in this encounter.  All questions were answered. The patient knows to call the clinic with any problems, questions or concerns. No barriers to learning was detected. The total time spent in the appointment was 25 minutes.     Malachy Mood, MD 03/21/2023   Carolin Coy, CMA, am acting as scribe for Malachy Mood, MD.   I have reviewed the above documentation for accuracy and completeness, and I agree with the above.

## 2023-03-20 NOTE — Assessment & Plan Note (Signed)
invasive lobular carcinoma, Stage IIIA, c(T3, N1), yp(T3, N1a), ER+/PR-/HER2+, Grade 2  -Diagnosed in 01/2022 -s/p neoadjuvant chemo TCHP X6, followed by right mastectomy by Dr. Dwain Sarna and reconstruction by Dr. Leta Baptist. Unfortunately she did not have much response to neoadjvuant chemo  -on adjuvant Kadcyla now, tolerating well with no noticeable side effects. Plan for a total of 14 treatment -she received postmastectomy radiation under Dr. Mitzi Hansen, 10/10 - 10/29/22. -she started adjuvant anastrozole in January 2024, she is tolerating very well -Echocardiogram in January 2024 showed slightly decreased EF 50 to 55%.  She was seen by cardiologist after echo, her cardiac medication was adjusted, and we will continue HER2 antibody.  repeated echo on 03/18/23 showed normal EF 55%

## 2023-03-21 ENCOUNTER — Inpatient Hospital Stay: Payer: PPO

## 2023-03-21 ENCOUNTER — Inpatient Hospital Stay: Payer: PPO | Attending: Hematology | Admitting: Hematology

## 2023-03-21 ENCOUNTER — Encounter: Payer: Self-pay | Admitting: Hematology

## 2023-03-21 VITALS — BP 125/63 | HR 51 | Temp 98.7°F | Resp 18 | Ht 69.5 in | Wt 157.8 lb

## 2023-03-21 VITALS — BP 130/65 | HR 51 | Temp 97.8°F | Resp 18

## 2023-03-21 DIAGNOSIS — Z79811 Long term (current) use of aromatase inhibitors: Secondary | ICD-10-CM | POA: Diagnosis not present

## 2023-03-21 DIAGNOSIS — D6481 Anemia due to antineoplastic chemotherapy: Secondary | ICD-10-CM | POA: Diagnosis not present

## 2023-03-21 DIAGNOSIS — Z7982 Long term (current) use of aspirin: Secondary | ICD-10-CM | POA: Insufficient documentation

## 2023-03-21 DIAGNOSIS — M8588 Other specified disorders of bone density and structure, other site: Secondary | ICD-10-CM | POA: Diagnosis not present

## 2023-03-21 DIAGNOSIS — C50811 Malignant neoplasm of overlapping sites of right female breast: Secondary | ICD-10-CM | POA: Diagnosis not present

## 2023-03-21 DIAGNOSIS — Z803 Family history of malignant neoplasm of breast: Secondary | ICD-10-CM | POA: Insufficient documentation

## 2023-03-21 DIAGNOSIS — Z87891 Personal history of nicotine dependence: Secondary | ICD-10-CM | POA: Insufficient documentation

## 2023-03-21 DIAGNOSIS — Z170 Estrogen receptor positive status [ER+]: Secondary | ICD-10-CM | POA: Diagnosis not present

## 2023-03-21 DIAGNOSIS — C773 Secondary and unspecified malignant neoplasm of axilla and upper limb lymph nodes: Secondary | ICD-10-CM | POA: Insufficient documentation

## 2023-03-21 DIAGNOSIS — Z95828 Presence of other vascular implants and grafts: Secondary | ICD-10-CM

## 2023-03-21 DIAGNOSIS — Z5112 Encounter for antineoplastic immunotherapy: Secondary | ICD-10-CM | POA: Diagnosis not present

## 2023-03-21 DIAGNOSIS — D6959 Other secondary thrombocytopenia: Secondary | ICD-10-CM | POA: Diagnosis not present

## 2023-03-21 DIAGNOSIS — Z79899 Other long term (current) drug therapy: Secondary | ICD-10-CM | POA: Diagnosis not present

## 2023-03-21 DIAGNOSIS — Z8042 Family history of malignant neoplasm of prostate: Secondary | ICD-10-CM | POA: Diagnosis not present

## 2023-03-21 DIAGNOSIS — T451X5A Adverse effect of antineoplastic and immunosuppressive drugs, initial encounter: Secondary | ICD-10-CM | POA: Diagnosis not present

## 2023-03-21 LAB — CBC WITH DIFFERENTIAL (CANCER CENTER ONLY)
Abs Immature Granulocytes: 0.01 10*3/uL (ref 0.00–0.07)
Basophils Absolute: 0.1 10*3/uL (ref 0.0–0.1)
Basophils Relative: 1 %
Eosinophils Absolute: 0.1 10*3/uL (ref 0.0–0.5)
Eosinophils Relative: 4 %
HCT: 32 % — ABNORMAL LOW (ref 36.0–46.0)
Hemoglobin: 10.4 g/dL — ABNORMAL LOW (ref 12.0–15.0)
Immature Granulocytes: 0 %
Lymphocytes Relative: 17 %
Lymphs Abs: 0.7 10*3/uL (ref 0.7–4.0)
MCH: 28.6 pg (ref 26.0–34.0)
MCHC: 32.5 g/dL (ref 30.0–36.0)
MCV: 87.9 fL (ref 80.0–100.0)
Monocytes Absolute: 0.4 10*3/uL (ref 0.1–1.0)
Monocytes Relative: 11 %
Neutro Abs: 2.7 10*3/uL (ref 1.7–7.7)
Neutrophils Relative %: 67 %
Platelet Count: 68 10*3/uL — ABNORMAL LOW (ref 150–400)
RBC: 3.64 MIL/uL — ABNORMAL LOW (ref 3.87–5.11)
RDW: 15 % (ref 11.5–15.5)
WBC Count: 4 10*3/uL (ref 4.0–10.5)
nRBC: 0 % (ref 0.0–0.2)

## 2023-03-21 LAB — CMP (CANCER CENTER ONLY)
ALT: 22 U/L (ref 0–44)
AST: 34 U/L (ref 15–41)
Albumin: 3.7 g/dL (ref 3.5–5.0)
Alkaline Phosphatase: 181 U/L — ABNORMAL HIGH (ref 38–126)
Anion gap: 7 (ref 5–15)
BUN: 14 mg/dL (ref 8–23)
CO2: 28 mmol/L (ref 22–32)
Calcium: 9.4 mg/dL (ref 8.9–10.3)
Chloride: 101 mmol/L (ref 98–111)
Creatinine: 0.81 mg/dL (ref 0.44–1.00)
GFR, Estimated: >60 mL/min (ref >60)
Glucose, Bld: 102 mg/dL — ABNORMAL HIGH (ref 70–99)
Potassium: 4.3 mmol/L (ref 3.5–5.1)
Sodium: 136 mmol/L (ref 135–145)
Total Bilirubin: 0.6 mg/dL (ref 0.3–1.2)
Total Protein: 7.6 g/dL (ref 6.5–8.1)

## 2023-03-21 MED ORDER — CETIRIZINE HCL 10 MG/ML IV SOLN
10.0000 mg | Freq: Once | INTRAVENOUS | Status: AC
  Administered 2023-03-21: 10 mg via INTRAVENOUS
  Filled 2023-03-21: qty 1

## 2023-03-21 MED ORDER — ACETAMINOPHEN 325 MG PO TABS
650.0000 mg | ORAL_TABLET | Freq: Once | ORAL | Status: AC
  Administered 2023-03-21: 650 mg via ORAL
  Filled 2023-03-21: qty 2

## 2023-03-21 MED ORDER — PROCHLORPERAZINE MALEATE 10 MG PO TABS
10.0000 mg | ORAL_TABLET | Freq: Once | ORAL | Status: AC
  Administered 2023-03-21: 10 mg via ORAL
  Filled 2023-03-21: qty 1

## 2023-03-21 MED ORDER — SODIUM CHLORIDE 0.9% FLUSH
10.0000 mL | INTRAVENOUS | Status: DC | PRN
  Administered 2023-03-21: 10 mL

## 2023-03-21 MED ORDER — SODIUM CHLORIDE 0.9 % IV SOLN
3.0000 mg/kg | Freq: Once | INTRAVENOUS | Status: AC
  Administered 2023-03-21: 200 mg via INTRAVENOUS
  Filled 2023-03-21: qty 10

## 2023-03-21 MED ORDER — SODIUM CHLORIDE 0.9 % IV SOLN: Freq: Once | INTRAVENOUS | Status: AC

## 2023-03-21 MED ORDER — HEPARIN SOD (PORK) LOCK FLUSH 100 UNIT/ML IV SOLN
500.0000 [IU] | Freq: Once | INTRAVENOUS | Status: AC | PRN
  Administered 2023-03-21: 500 [IU]

## 2023-03-21 MED ORDER — SODIUM CHLORIDE 0.9% FLUSH
10.0000 mL | Freq: Once | INTRAVENOUS | Status: AC
  Administered 2023-03-21: 10 mL

## 2023-03-21 NOTE — Progress Notes (Signed)
Patient seen by Dr. America Brown are within treatment parameters.  Labs reviewed: and are not all within treatment parameters. WBC 68  Per physician team, patient is ready for treatment. Please note that modifications are being made to the treatment plan including   Kadcyla at same reduce dose

## 2023-03-21 NOTE — Progress Notes (Signed)
OK to treat per Per Dr. Latanya Maudlin note from 03/21/23 : "platelet count 68 -proceed with C8 Kadcyla at reduce dose due to low platelet count"

## 2023-03-21 NOTE — Progress Notes (Signed)
Pt declined 30 min post Kadcyla observation. VSS at discharge. 

## 2023-03-28 ENCOUNTER — Ambulatory Visit
Admission: RE | Admit: 2023-03-28 | Discharge: 2023-03-28 | Disposition: A | Discharge status: Home / Self Care | Payer: PPO | Source: Ambulatory Visit | Attending: Family Medicine | Admitting: Family Medicine

## 2023-03-28 DIAGNOSIS — M8589 Other specified disorders of bone density and structure, multiple sites: Secondary | ICD-10-CM | POA: Diagnosis not present

## 2023-03-28 DIAGNOSIS — Z780 Asymptomatic menopausal state: Secondary | ICD-10-CM | POA: Diagnosis not present

## 2023-03-28 DIAGNOSIS — M8580 Other specified disorders of bone density and structure, unspecified site: Secondary | ICD-10-CM

## 2023-04-02 ENCOUNTER — Other Ambulatory Visit: Payer: Self-pay

## 2023-04-02 DIAGNOSIS — Z9221 Personal history of antineoplastic chemotherapy: Secondary | ICD-10-CM

## 2023-04-03 ENCOUNTER — Other Ambulatory Visit (HOSPITAL_COMMUNITY): Payer: Self-pay

## 2023-04-03 ENCOUNTER — Other Ambulatory Visit: Payer: Self-pay | Admitting: Pulmonary Disease

## 2023-04-03 MED ORDER — TRELEGY ELLIPTA 200-62.5-25 MCG/ACT IN AEPB
1.0000 | INHALATION_SPRAY | Freq: Every day | RESPIRATORY_TRACT | 2 refills | Status: DC
  Filled 2023-04-03: qty 60, 30d supply, fill #0
  Filled 2023-04-29: qty 60, 30d supply, fill #1
  Filled 2023-05-29: qty 60, 30d supply, fill #2

## 2023-04-04 ENCOUNTER — Other Ambulatory Visit (HOSPITAL_COMMUNITY): Payer: Self-pay

## 2023-04-04 ENCOUNTER — Inpatient Hospital Stay: Payer: PPO | Attending: Hematology

## 2023-04-04 DIAGNOSIS — Z79899 Other long term (current) drug therapy: Secondary | ICD-10-CM | POA: Diagnosis not present

## 2023-04-04 DIAGNOSIS — Z7982 Long term (current) use of aspirin: Secondary | ICD-10-CM | POA: Insufficient documentation

## 2023-04-04 DIAGNOSIS — R7303 Prediabetes: Secondary | ICD-10-CM | POA: Insufficient documentation

## 2023-04-04 DIAGNOSIS — I7143 Infrarenal abdominal aortic aneurysm, without rupture: Secondary | ICD-10-CM | POA: Insufficient documentation

## 2023-04-04 DIAGNOSIS — Z923 Personal history of irradiation: Secondary | ICD-10-CM | POA: Diagnosis not present

## 2023-04-04 DIAGNOSIS — Z5112 Encounter for antineoplastic immunotherapy: Secondary | ICD-10-CM | POA: Diagnosis not present

## 2023-04-04 DIAGNOSIS — Z9011 Acquired absence of right breast and nipple: Secondary | ICD-10-CM | POA: Insufficient documentation

## 2023-04-04 DIAGNOSIS — Z79811 Long term (current) use of aromatase inhibitors: Secondary | ICD-10-CM | POA: Diagnosis not present

## 2023-04-04 DIAGNOSIS — M8588 Other specified disorders of bone density and structure, other site: Secondary | ICD-10-CM | POA: Diagnosis not present

## 2023-04-04 DIAGNOSIS — C50811 Malignant neoplasm of overlapping sites of right female breast: Secondary | ICD-10-CM | POA: Diagnosis not present

## 2023-04-04 DIAGNOSIS — Z800 Family history of malignant neoplasm of digestive organs: Secondary | ICD-10-CM | POA: Insufficient documentation

## 2023-04-04 DIAGNOSIS — Z803 Family history of malignant neoplasm of breast: Secondary | ICD-10-CM | POA: Diagnosis not present

## 2023-04-04 DIAGNOSIS — Z8042 Family history of malignant neoplasm of prostate: Secondary | ICD-10-CM | POA: Insufficient documentation

## 2023-04-04 DIAGNOSIS — Z170 Estrogen receptor positive status [ER+]: Secondary | ICD-10-CM | POA: Diagnosis not present

## 2023-04-04 DIAGNOSIS — I10 Essential (primary) hypertension: Secondary | ICD-10-CM | POA: Insufficient documentation

## 2023-04-04 DIAGNOSIS — C773 Secondary and unspecified malignant neoplasm of axilla and upper limb lymph nodes: Secondary | ICD-10-CM | POA: Diagnosis not present

## 2023-04-04 DIAGNOSIS — R0989 Other specified symptoms and signs involving the circulatory and respiratory systems: Secondary | ICD-10-CM | POA: Insufficient documentation

## 2023-04-04 DIAGNOSIS — Z87891 Personal history of nicotine dependence: Secondary | ICD-10-CM | POA: Insufficient documentation

## 2023-04-04 DIAGNOSIS — Z95828 Presence of other vascular implants and grafts: Secondary | ICD-10-CM

## 2023-04-04 DIAGNOSIS — F419 Anxiety disorder, unspecified: Secondary | ICD-10-CM | POA: Insufficient documentation

## 2023-04-04 LAB — CBC WITH DIFFERENTIAL (CANCER CENTER ONLY)
Abs Immature Granulocytes: 0.02 10*3/uL (ref 0.00–0.07)
Basophils Absolute: 0 10*3/uL (ref 0.0–0.1)
Basophils Relative: 1 %
Eosinophils Absolute: 0.2 10*3/uL (ref 0.0–0.5)
Eosinophils Relative: 4 %
HCT: 33.1 % — ABNORMAL LOW (ref 36.0–46.0)
Hemoglobin: 10.4 g/dL — ABNORMAL LOW (ref 12.0–15.0)
Immature Granulocytes: 1 %
Lymphocytes Relative: 16 %
Lymphs Abs: 0.7 10*3/uL (ref 0.7–4.0)
MCH: 27.5 pg (ref 26.0–34.0)
MCHC: 31.4 g/dL (ref 30.0–36.0)
MCV: 87.6 fL (ref 80.0–100.0)
Monocytes Absolute: 0.4 10*3/uL (ref 0.1–1.0)
Monocytes Relative: 11 %
Neutro Abs: 2.8 10*3/uL (ref 1.7–7.7)
Neutrophils Relative %: 67 %
Platelet Count: 78 10*3/uL — ABNORMAL LOW (ref 150–400)
RBC: 3.78 MIL/uL — ABNORMAL LOW (ref 3.87–5.11)
RDW: 15.8 % — ABNORMAL HIGH (ref 11.5–15.5)
WBC Count: 4.1 10*3/uL (ref 4.0–10.5)
nRBC: 0 % (ref 0.0–0.2)

## 2023-04-04 LAB — CMP (CANCER CENTER ONLY)
ALT: 20 U/L (ref 0–44)
AST: 35 U/L (ref 15–41)
Albumin: 3.7 g/dL (ref 3.5–5.0)
Alkaline Phosphatase: 187 U/L — ABNORMAL HIGH (ref 38–126)
Anion gap: 7 (ref 5–15)
BUN: 12 mg/dL (ref 8–23)
CO2: 30 mmol/L (ref 22–32)
Calcium: 9.5 mg/dL (ref 8.9–10.3)
Chloride: 102 mmol/L (ref 98–111)
Creatinine: 0.74 mg/dL (ref 0.44–1.00)
GFR, Estimated: >60 mL/min (ref >60)
Glucose, Bld: 115 mg/dL — ABNORMAL HIGH (ref 70–99)
Potassium: 3.6 mmol/L (ref 3.5–5.1)
Sodium: 139 mmol/L (ref 135–145)
Total Bilirubin: 0.7 mg/dL (ref 0.3–1.2)
Total Protein: 7.5 g/dL (ref 6.5–8.1)

## 2023-04-04 MED ORDER — SODIUM CHLORIDE 0.9% FLUSH
10.0000 mL | Freq: Once | INTRAVENOUS | Status: AC
  Administered 2023-04-04: 10 mL

## 2023-04-04 MED ORDER — HEPARIN SOD (PORK) LOCK FLUSH 100 UNIT/ML IV SOLN
500.0000 [IU] | Freq: Once | INTRAVENOUS | Status: AC
  Administered 2023-04-04: 500 [IU]

## 2023-04-11 ENCOUNTER — Ambulatory Visit: Payer: PPO

## 2023-04-11 ENCOUNTER — Other Ambulatory Visit: Payer: PPO

## 2023-04-11 ENCOUNTER — Other Ambulatory Visit (HOSPITAL_COMMUNITY): Payer: Self-pay

## 2023-04-11 DIAGNOSIS — Z0000 Encounter for general adult medical examination without abnormal findings: Secondary | ICD-10-CM | POA: Diagnosis not present

## 2023-04-11 DIAGNOSIS — D8481 Immunodeficiency due to conditions classified elsewhere: Secondary | ICD-10-CM | POA: Diagnosis not present

## 2023-04-11 DIAGNOSIS — E7800 Pure hypercholesterolemia, unspecified: Secondary | ICD-10-CM | POA: Diagnosis not present

## 2023-04-11 DIAGNOSIS — C50919 Malignant neoplasm of unspecified site of unspecified female breast: Secondary | ICD-10-CM | POA: Diagnosis not present

## 2023-04-11 DIAGNOSIS — I6529 Occlusion and stenosis of unspecified carotid artery: Secondary | ICD-10-CM | POA: Diagnosis not present

## 2023-04-11 DIAGNOSIS — R7303 Prediabetes: Secondary | ICD-10-CM | POA: Diagnosis not present

## 2023-04-11 DIAGNOSIS — F3342 Major depressive disorder, recurrent, in full remission: Secondary | ICD-10-CM | POA: Diagnosis not present

## 2023-04-11 DIAGNOSIS — I700 Atherosclerosis of aorta: Secondary | ICD-10-CM | POA: Diagnosis not present

## 2023-04-11 DIAGNOSIS — I10 Essential (primary) hypertension: Secondary | ICD-10-CM | POA: Diagnosis not present

## 2023-04-11 DIAGNOSIS — I2510 Atherosclerotic heart disease of native coronary artery without angina pectoris: Secondary | ICD-10-CM | POA: Diagnosis not present

## 2023-04-11 DIAGNOSIS — M8588 Other specified disorders of bone density and structure, other site: Secondary | ICD-10-CM | POA: Diagnosis not present

## 2023-04-11 DIAGNOSIS — R7309 Other abnormal glucose: Secondary | ICD-10-CM | POA: Diagnosis not present

## 2023-04-11 DIAGNOSIS — J439 Emphysema, unspecified: Secondary | ICD-10-CM | POA: Diagnosis not present

## 2023-04-11 MED ORDER — ALENDRONATE SODIUM 70 MG PO TABS
ORAL_TABLET | ORAL | 3 refills | Status: DC
  Filled 2023-04-11: qty 12, 84d supply, fill #0
  Filled 2023-06-27: qty 12, 84d supply, fill #1
  Filled 2023-09-11: qty 12, 84d supply, fill #2
  Filled 2023-11-30: qty 12, 84d supply, fill #3

## 2023-04-11 MED ORDER — FLUOXETINE HCL 20 MG PO CAPS
20.0000 mg | ORAL_CAPSULE | Freq: Every day | ORAL | 3 refills | Status: DC
  Filled 2023-04-11 – 2023-06-19 (×2): qty 90, 90d supply, fill #0
  Filled 2023-09-11: qty 90, 90d supply, fill #1
  Filled 2023-11-20: qty 90, 90d supply, fill #2
  Filled 2024-03-23: qty 90, 90d supply, fill #3

## 2023-04-17 NOTE — Progress Notes (Unsigned)
Patient Care Team: Gweneth Dimitri, MD as PCP - General (Family Medicine) Marykay Lex, MD as PCP - Cardiology (Cardiology) Pershing Proud, RN as Oncology Nurse Navigator Donnelly Angelica, RN as Oncology Nurse Navigator Emelia Loron, MD as Consulting Physician (General Surgery) Malachy Mood, MD as Consulting Physician (Hematology) Dorothy Puffer, MD as Consulting Physician (Radiation Oncology)   CHIEF COMPLAINT: Follow-up right breast cancer  Oncology History Overview Note   Cancer Staging  Malignant neoplasm of overlapping sites of right breast in female, estrogen receptor positive [facility]) Staging form: Breast, AJCC 8th Edition - Clinical stage from 02/09/2022: Stage IIA (cT2, cN1, cM0, G2, ER+, PR-, HER2+) - Signed by Malachy Mood, MD on 02/20/2022    Malignant neoplasm of overlapping sites of right breast in female, estrogen receptor positive (HCC)  02/08/2022 Mammogram   CLINICAL DATA:  Acute onset right breast lump.  EXAM: DIGITAL DIAGNOSTIC UNILATERAL RIGHT MAMMOGRAM WITH TOMOSYNTHESIS AND CAD; ULTRASOUND RIGHT BREAST LIMITED  IMPRESSION: 1. Highly suspicious findings mammographically and sonographically. I suspect there is significant malignancy throughout most of the right breast. Discrete masses are seen at 11 o'clock and 6 o'clock.  A single borderline lymph node is identified. This lymph node appears prominent compared to the remainder of the lymph nodes. The skin thickening suggests the possibility of inflammatory breast cancer.   02/09/2022 Cancer Staging   Staging form: Breast, AJCC 8th Edition - Clinical stage from 02/09/2022: Stage IIIA (cT3, cN1, cM0, G2, ER+, PR-, HER2+) - Signed by Malachy Mood, MD on 03/05/2022 Stage prefix: Initial diagnosis Histologic grading system: 3 grade system   02/09/2022 Initial Biopsy   Diagnosis 1. Breast, right, needle core biopsy, right breast 11 o'clock mass ribbon clip - INVASIVE MAMMARY CARCINOMA - SEE COMMENT 2. Breast, right,  needle core biopsy, right breast 6'oclock mass, coil clip - INVASIVE MAMMARY CARCINOMA - SEE COMMENT 3. Lymph node, needle/core biopsy, right axillary lymph node, tribell clip - METASTATIC CARCINOMA INVOLVING NODAL TISSUE Microscopic Comment 1. and 2. The biopsy material shows an infiltrative proliferation of cells arranged linearly and in small clusters. Based on the biopsy, the carcinoma appears Nottingham grade 2 of 3 and measures 1.5 cm in greatest linear extent.  Addendum parts 1 and 2: Immunohistochemistry for E-cadherin is negative consistent with lobular carcinoma.  2. PROGNOSTIC INDICATORS Results: The tumor cells are EQUIVOCAL for Her2 (2+). Her2 by FISH will be performed and the results reported separately. Estrogen Receptor: 100%, POSITIVE, STRONG STAINING INTENSITY Progesterone Receptor: <1%, NEGATIVE Proliferation Marker Ki67: 10%  2. FLUORESCENCE IN-SITU HYBRIDIZATION Results: GROUP 1: HER2 **POSITIVE**   3. PROGNOSTIC INDICATORS Results: By immunohistochemistry, the tumor cells are POSITIVE for Her2 (3+). Estrogen Receptor: 100%, POSITIVE, STRONG STAINING INTENSITY Progesterone Receptor: <1%, NEGATIVE   02/16/2022 Initial Diagnosis   Malignant neoplasm of overlapping sites of right breast in female, estrogen receptor positive (HCC)   03/01/2022 Imaging   EXAM: BILATERAL BREAST MRI WITH AND WITHOUT CONTRAST  IMPRESSION: 1. 8.5 x 8.3 x 5.9 cm area of confluent mass-like enhancement in the right breast involving all 4 quadrants and containing 2 biopsy marker clip artifacts, compatible with 4 quadrant biopsy-proven malignancy. 2. Biopsy-proven metastatic lymph node in the right axilla. 3. Possible metastatic intramammary lymph node in the posterior outer right breast just above the level of the nipple. 4. No evidence of malignancy on the left.   03/01/2022 Genetic Testing   Negative hereditary cancer genetic testing: no pathogenic variants detected in [facility]  CustomNext-Cancer +RNAinsight Panel.  Variant  of uncertain significance reported in BRIP1 at p.F934V (c.2800T>G). Report date is March 01, 2022.    The CustomNext-Cancer+RNAinsight panel offered by [employer] includes sequencing and rearrangement analysis for the following 47 genes:  APC, ATM, AXIN2, BARD1, BMPR1A, BRCA1, BRCA2, BRIP1, CDH1, CDK4, CDKN2A, CHEK2, DICER1, EPCAM, GREM1, HOXB13, MEN1, MLH1, MSH2, MSH3, MSH6, MUTYH, NBN, NF1, NF2, NTHL1, PALB2, PMS2, POLD1, POLE, PTEN, RAD51C, RAD51D, RECQL, RET, SDHA, SDHAF2, SDHB, SDHC, SDHD, SMAD4, SMARCA4, STK11, TP53, TSC1, TSC2, and VHL.  RNA data is routinely analyzed for use in variant interpretation for all genes.   03/05/2022 Imaging   EXAM: CT CHEST, ABDOMEN, AND PELVIS WITH CONTRAST  IMPRESSION: 1. Asymmetric, masslike density of the glandular tissue of the right breast with overlying skin thickening, in keeping with known primary breast malignancy. 2. Enlarged, ill-defined right axillary lymph node containing a biopsy marking clip, consistent with known nodal metastatic disease. 3. No other evidence of lymphadenopathy or metastatic disease in the chest, abdomen, or pelvis. 4. Emphysema. Background of fine centrilobular nodularity, most concentrated in the lung apices, consistent with smoking-related respiratory bronchiolitis. 5. Aneurysm of the infrarenal abdominal aorta measuring up to 5.4 x 5.3 cm with a large burden of eccentric mural thrombus. Recommend follow-up CT/MR every 6 months and vascular consultation. This recommendation follows ACR consensus guidelines: White Paper of the ACR Incidental Findings Committee II on Vascular Findings. J Am Coll Radiol 2013; 10:789-794. 6. Coronary artery disease.   03/05/2022 Imaging   EXAM: NUCLEAR MEDICINE WHOLE BODY BONE SCAN  IMPRESSION: No evidence of bony metastatic disease.   03/07/2022 - 08/01/2022 Chemotherapy   Patient is on Treatment Plan : BREAST  Docetaxel + Carboplatin +  Trastuzumab + Pertuzumab  (TCHP) q21d      07/26/2022 Surgery   Right mastectomy with axillary node seed guided excision converted to axillary node dissection with reconstruction by Drs. Dwain Sarna and Thimmappa   07/26/2022 Pathology Results   FINAL MICROSCOPIC DIAGNOSIS:   A. LYMPH NODE, RIGHT AXILLARY TARGETED, EXCISION:  - Metastatic carcinoma involving one lymph node, 2 cm (1/1).  - Focal extranodal extension.  - Biopsy site and biopsy clip.   B. BREAST, RIGHT, MASTECTOMY:  - Invasive lobular carcinoma, 9.5 cm (ypT3).  - Carcinoma involves dermis of nipple.  - All surgical margins negative for carcinoma.  - One lymph node negative for metastatic carcinoma (0/1).  - Biopsy sites and biopsy clips.  - See oncology table.   C. LYMPH NODE, RIGHT AXILLARY, SENTINEL, EXCISION:  - One lymph node negative for metastatic carcinoma (0/1).   D. LYMPH NODE, RIGHT AXILLARY, SENTINEL, EXCISION:  - Metastatic carcinoma in one lymph node, 0.5 cm. (1/1).   E. LYMPH NODE, RIGHT AXILLARY, SENTINEL, EXCISION:  - One lymph node negative for metastatic carcinoma (0/1).    07/26/2022 Cancer Staging   Cancer Staging  Malignant neoplasm of overlapping sites of right breast in female, estrogen receptor positive (HCC) Staging form: Breast, AJCC 8th Edition - Clinical stage from 02/09/2022: Stage IIIA (cT3, cN1, cM0, G2, ER+, PR-, HER2+) - Signed by Malachy Mood, MD on 03/05/2022 Stage prefix: Initial diagnosis Histologic grading system: 3 grade system - Pathologic stage from 07/26/2022: Stage IIIA (pT3, pN1a, cM0, G2, ER+, PR-, HER2+) - Signed by Pollyann Samples, NP on 08/01/2022 Histologic grading system: 3 grade system    07/26/2022 Cancer Staging   Staging form: Breast, AJCC 8th Edition - Pathologic stage from 07/26/2022: Stage IIIA (pT3, pN1a, cM0, G2, ER+, PR-, HER2+) - Signed by Santiago Glad  K, NP on 08/01/2022 Histologic grading system: 3 grade system   08/24/2022 -  Chemotherapy   Patient is on  Treatment Plan : BREAST ADO-Trastuzumab Emtansine (Kadcyla) q21d        CURRENT THERAPY:  Kadcyla, q21d, started 08/24/22, plan for a total of 14 cycles  Anastrozole started in 12/2022    INTERVAL HISTORY Ms. Excell Seltzer returns for follow-up and treatment as scheduled, last seen by Dr. Mosetta Putt 03/21/2023 and completed another cycle of Kadcyla.  This week's dose was postponed due to thrombocytopenia.  She feels well in general, no side effects from Kadcyla and anastrozole.  Eating and drinking well with good energy level.  Denies bleeding, n/v/c/d, fever/chills, concerns in her breasts, or any other specific complaints.   ROS  All other systems reviewed and negative  Past Medical History:  Diagnosis Date   Allergy    Lisinopril and lipitor   Anxiety and depression    Aortic atherosclerosis (HCC) 03/2020   CT Chest: 2 V (LAD & LCx) Coronary Atherosclerosis, Aortic Atherosclerosis (no aneurysm).  Mild centrilobular emphysema with mild diffuse bronchial thickening; several scattered small solitary pulmonary nodules (largest 5.6 mm in anterior left upper lobe)   Breast cancer (HCC) 12/2021   Right breast ILC   Carotid artery plaque, bilateral 10/2015   Mild to moderate plaque L>R without significant stenosis   COPD (chronic obstructive pulmonary disease) (HCC)    Coronary Artery Calcification - Score 79    Coronary Calcium Score 79.  LAD and circumflex calcification noted.  Normal ascending aorta with mild calcification.   Current every day smoker    pt quit smoking April 2023   Emphysema lung [facility])    Noted on chest CT   Family history of breast cancer 02/21/2022   Family history of prostate cancer 02/21/2022   Hyperlipidemia    Hypertension    Controlled with amlodipine   Prediabetes    Skin cancer 2021   Remove 2022     Past Surgical History:  Procedure Laterality Date   ABDOMINAL AORTIC ANEURYSM REPAIR N/A 01/11/2023   Procedure: ANEURYSM ABDOMINAL AORTIC REPAIR;  Surgeon: Nada Libman, MD;  Location: [facility] OR;  Service: Vascular;  Laterality: N/A;   BREAST BIOPSY Right    times 3   BREAST RECONSTRUCTION WITH PLACEMENT OF TISSUE EXPANDER AND ALLODERM Right 07/26/2022   Procedure: RIGHT BREAST RECONSTRUCTION WITH PLACEMENT OF TISSUE EXPANDER AND ALLODERM;  Surgeon: Glenna Fellows, MD;  Location: [facility] OR;  Service: Plastics;  Laterality: Right;   CESAREAN SECTION  1990, 1991   COSMETIC SURGERY  1981   Rhinoplasty   MASTECTOMY Right    MASTECTOMY W/ SENTINEL NODE BIOPSY Right 07/26/2022   Procedure: RIGHT MASTECTOMY, RIGHT AXILLARY SENTINEL NODE BIOPSY;  Surgeon: Emelia Loron, MD;  Location: [facility] OR;  Service: General;  Laterality: Right;  GEN & PEC BLOCK   PORTACATH PLACEMENT Left 03/06/2022   Procedure: INSERTION PORT-A-CATH;  Surgeon: Emelia Loron, MD;  Location:  SURGERY CENTER;  Service: General;  Laterality: Left;   RADIOACTIVE SEED GUIDED AXILLARY SENTINEL LYMPH NODE Right 07/26/2022   Procedure: RADIOACTIVE SEED GUIDED AXILLARY SENTINEL LYMPH NODE DISSECTION;  Surgeon: Emelia Loron, MD;  Location: [facility];  Service: General;  Laterality: Right;   RHINOPLASTY  1981   TONSILLECTOMY  1978   TRANSTHORACIC ECHOCARDIOGRAM  10/2016   EF 55 to 60%.  Normal systolic and diastolic function.  No ASD/PFO   TUBAL LIGATION  1991     Outpatient Encounter Medications as  of 04/18/2023  Medication Sig   albuterol (PROVENTIL HFA) 108 (90 Base) MCG/ACT inhaler Inhale 1 to 2 puffs into the lungs every 6 hours as needed   alendronate (FOSAMAX) 70 MG tablet Take 1 tablet by mouth once weekly --Take 30 minutes before the first food, beverage or medicine of the day with plain water   amLODipine (NORVASC) 10 MG tablet Take 1 tablet (10 mg total) by mouth at bedtime.   anastrozole (ARIMIDEX) 1 MG tablet Take 1 tablet (1 mg total) by mouth daily.   aspirin 81 MG tablet Take 81 mg by mouth at bedtime.   Calcium Carb-Cholecalciferol (CALCIUM 500 + D3 PO) Take 1  tablet by mouth daily.   carvedilol (COREG) 6.25 MG tablet Take 1 tablet (6.25 mg total) by mouth 2 (two) times daily.   Cholecalciferol (VITAMIN D) 50 MCG (2000 UT) CAPS Take 1 capsule by mouth daily.   ezetimibe (ZETIA) 10 MG tablet Take 1 tablet (10 mg total) by mouth daily.   FLUoxetine (PROZAC) 20 MG capsule Take 1 capsule by mouth once daily   Fluticasone-Umeclidin-Vilant (TRELEGY ELLIPTA) 200-62.5-25 MCG/ACT AEPB Inhale 1 puff into the lungs daily.   OVER THE COUNTER MEDICATION Take 1 capsule by mouth daily. Serrapeptase 120,000 units daily  Reported on 12/27/2015   rosuvastatin (CRESTOR) 5 MG tablet Take 1 tablet by mouth daily at bedtime ,except on Monday, Wednesday and Fridays take 2 tablets at bedtime as directed   Specialty Vitamins Products (MAGNESIUM, AMINO ACID CHELATE,) 133 MG tablet Take 1 tablet by mouth at bedtime.   ibuprofen (ADVIL) 200 MG tablet Take 600 mg by mouth every 6 (six) hours as needed for moderate pain.   [DISCONTINUED] hydrochlorothiazide (HYDRODIURIL) 25 MG tablet Take 1 tablet (25 mg total) by mouth daily.   [DISCONTINUED] lisinopril (ZESTRIL) 5 MG tablet Take 1 tablet (5 mg total) by mouth daily.   Facility-Administered Encounter Medications as of 04/18/2023  Medication   [COMPLETED] sodium chloride flush (NS) 0.9 % injection 10 mL     Today's Vitals   04/18/23 0916 04/18/23 0918  BP: 116/67   Pulse: (!) 51   Resp: 17   Temp: 97.9 F (36.6 C)   TempSrc: Temporal   SpO2: 100%   Weight: 150 lb (68 kg)   Height: 5' 9.5" (1.765 m)   PainSc:  2    Body mass index is 21.83 kg/m.   PHYSICAL EXAM GENERAL:alert, no distress and comfortable SKIN: no rash  EYES: sclera clear LUNGS: normal breathing effort HEART: no lower extremity edema NEURO: alert & oriented x 3 with fluent speech, no focal motor/sensory deficits Breast exam: deferred PAC without erythema    CBC    Component Value Date/Time   WBC 4.3 04/18/2023 0847   WBC 4.7 01/15/2023 0155    RBC 4.21 04/18/2023 0847   HGB 11.4 (L) 04/18/2023 0847   HCT 36.0 04/18/2023 0847   PLT 83 (L) 04/18/2023 0847   MCV 85.5 04/18/2023 0847   MCH 27.1 04/18/2023 0847   MCHC 31.7 04/18/2023 0847   RDW 16.1 (H) 04/18/2023 0847   LYMPHSABS 0.7 04/18/2023 0847   MONOABS 0.4 04/18/2023 0847   EOSABS 0.2 04/18/2023 0847   BASOSABS 0.0 04/18/2023 0847     CMP     Component Value Date/Time   NA 137 04/18/2023 0847   NA 135 07/24/2021 0853   K 4.1 04/18/2023 0847   CL 103 04/18/2023 0847   CO2 28 04/18/2023 0847   GLUCOSE 109 (H)  04/18/2023 0847   BUN 13 04/18/2023 0847   BUN 12 07/24/2021 0853   CREATININE 0.75 04/18/2023 0847   CALCIUM 9.2 04/18/2023 0847   PROT 7.6 04/18/2023 0847   PROT 7.0 08/03/2020 0832   ALBUMIN 3.8 04/18/2023 0847   ALBUMIN 4.1 08/03/2020 0832   AST 32 04/18/2023 0847   ALT 20 04/18/2023 0847   ALKPHOS 178 (H) 04/18/2023 0847   BILITOT 0.6 04/18/2023 0847   GFRNONAA >60 04/18/2023 0847   GFRAA 107 11/03/2020 1206     ASSESSMENT & PLAN:Evvie L Minich is a 67 y.o. post-menopausal female with    1. Malignant neoplasm of overlapping sites of right breast, invasive lobular carcinoma, Stage IIIA, c(T3, N1), ER+/PR-/HER2+, Grade 2  -Diagnosed 02/08/22, invasive lobular carcinoma to both sites and metastatic carcinoma in lymph node.  -breast MRI on 03/01/22 showed: 8.5 cm area of confluent mass-like enhancement in right breast involving all 4 quadrants; biopsy-proven metastatic lymph node in right axilla; possible metastatic intramammary lymph node in posterior outer right breast; no evidence of malignancy on left. -staging CT CAP and bone scan on 03/05/22 were negative for metastatic disease. -baseline echo on 03/05/22 showed EF of 55-60%, normal. -She completed 6 cycles of neoadjuvant TCHP on 03/07/22 -06/20/2022  -She proceeded with right mastectomy by Dr. Dwain Sarna and immediate reconstruction with tissue expander by Dr. Leta Baptist on 07/26/2022,  -Surgical path  shows residual lobular carcinoma in the right breast involving the dermis of the nipple, with clear margins, and 2 of 5 positive lymph nodes.  There was minimal treatment effect.  This remains ypT3 ypN1a stage III.  Again ER strongly positive, PR negative, HER2 positive with a Ki-67 of 10%. -She started adjuvant Kadcyla for a total of 14 treatments -She completed postmastectomy radiation by Dr. Mitzi Hansen 10/10 - 10/29/2022 and started adjuvant anastrozole 12/2022 tolerating well -Followed by cardio for reduced EF on HER2 antibody; most recent echo 03/18/23 with normal cardiac function plan to repeat 07/2023 -Ms. Hallums appears stable. S/p 10 cycles of Kadcyla, tolerating well without significant side effects. -Labs reviewed, PLT 83K, otherwise stable and adequate to continue dose reduced Kadcyla q3 weeks to complete 14 cycles. Proceed today -F/up in 3 and 9 weeks (final cycle)   2. Genetics -she has a family history of breast cancer in a paternal aunt and a niece, liver cancer in her maternal grandfather, and prostate cancer in her father. -testing performed on 02/21/22 was negative, with a VUS in BRIP1   3. Bone Health  -Her most recent DEXA was on 04/29/20 showing osteopenia (T-score of -2.1 at L1-3). Most recent DEXA 03/2023 per PCP showed osteopenia -2.4 with high FRAX score.   -Given that and her previous wrist fracture her PCP started Fosamax, she continues calcium and vit D  4. COPD and Smoking Cessation -she reports she quit smoking on 03/01/22. She notes she is currently using a non-nicotine vape, which has aided her in quitting.   5. HTN and prediabetes, anxiety -Continue medication, and follow-up with PCP -BP occasionally high in clinic, overall stable on treatment on chemotherapy   6.  Infrarenal abdominal aortic aneurysm -Seen on breast cancer staging CT, measures up to 5.4 x 5.3 cm with a large burden of eccentric mural thrombus -Per patient she sees Dr. Arbie Cookey for carotid bruit, will  see him for this as well    PLAN: -Labs reviewed, plt 83K -Proceed with dose reduced Kadcyla today as scheduled, continue q3 weeks to complete 14 cycles -Continue Anastrozole  -  F/up in 3 and 9 weeks   All questions were answered. The patient knows to call the clinic with any problems, questions or concerns. No barriers to learning were detected.   Santiago Glad, NP-C 04/18/2023

## 2023-04-18 ENCOUNTER — Encounter: Payer: Self-pay | Admitting: Nurse Practitioner

## 2023-04-18 ENCOUNTER — Inpatient Hospital Stay (HOSPITAL_BASED_OUTPATIENT_CLINIC_OR_DEPARTMENT_OTHER): Payer: PPO | Admitting: Nurse Practitioner

## 2023-04-18 ENCOUNTER — Other Ambulatory Visit (HOSPITAL_COMMUNITY): Payer: Self-pay

## 2023-04-18 ENCOUNTER — Inpatient Hospital Stay: Payer: PPO

## 2023-04-18 VITALS — BP 116/67 | HR 51 | Temp 97.9°F | Resp 17 | Ht 69.5 in | Wt 150.0 lb

## 2023-04-18 VITALS — BP 131/63 | HR 49 | Resp 17

## 2023-04-18 DIAGNOSIS — Z170 Estrogen receptor positive status [ER+]: Secondary | ICD-10-CM | POA: Diagnosis not present

## 2023-04-18 DIAGNOSIS — C50811 Malignant neoplasm of overlapping sites of right female breast: Secondary | ICD-10-CM

## 2023-04-18 DIAGNOSIS — Z5112 Encounter for antineoplastic immunotherapy: Secondary | ICD-10-CM | POA: Diagnosis not present

## 2023-04-18 DIAGNOSIS — Z95828 Presence of other vascular implants and grafts: Secondary | ICD-10-CM

## 2023-04-18 LAB — CBC WITH DIFFERENTIAL (CANCER CENTER ONLY)
Abs Immature Granulocytes: 0.01 10*3/uL (ref 0.00–0.07)
Basophils Absolute: 0 10*3/uL (ref 0.0–0.1)
Basophils Relative: 1 %
Eosinophils Absolute: 0.2 10*3/uL (ref 0.0–0.5)
Eosinophils Relative: 4 %
HCT: 36 % (ref 36.0–46.0)
Hemoglobin: 11.4 g/dL — ABNORMAL LOW (ref 12.0–15.0)
Immature Granulocytes: 0 %
Lymphocytes Relative: 16 %
Lymphs Abs: 0.7 10*3/uL (ref 0.7–4.0)
MCH: 27.1 pg (ref 26.0–34.0)
MCHC: 31.7 g/dL (ref 30.0–36.0)
MCV: 85.5 fL (ref 80.0–100.0)
Monocytes Absolute: 0.4 10*3/uL (ref 0.1–1.0)
Monocytes Relative: 9 %
Neutro Abs: 3 10*3/uL (ref 1.7–7.7)
Neutrophils Relative %: 70 %
Platelet Count: 83 10*3/uL — ABNORMAL LOW (ref 150–400)
RBC: 4.21 MIL/uL (ref 3.87–5.11)
RDW: 16.1 % — ABNORMAL HIGH (ref 11.5–15.5)
WBC Count: 4.3 10*3/uL (ref 4.0–10.5)
nRBC: 0 % (ref 0.0–0.2)

## 2023-04-18 LAB — CMP (CANCER CENTER ONLY)
ALT: 20 U/L (ref 0–44)
AST: 32 U/L (ref 15–41)
Albumin: 3.8 g/dL (ref 3.5–5.0)
Alkaline Phosphatase: 178 U/L — ABNORMAL HIGH (ref 38–126)
Anion gap: 6 (ref 5–15)
BUN: 13 mg/dL (ref 8–23)
CO2: 28 mmol/L (ref 22–32)
Calcium: 9.2 mg/dL (ref 8.9–10.3)
Chloride: 103 mmol/L (ref 98–111)
Creatinine: 0.75 mg/dL (ref 0.44–1.00)
GFR, Estimated: >60 mL/min (ref >60)
Glucose, Bld: 109 mg/dL — ABNORMAL HIGH (ref 70–99)
Potassium: 4.1 mmol/L (ref 3.5–5.1)
Sodium: 137 mmol/L (ref 135–145)
Total Bilirubin: 0.6 mg/dL (ref 0.3–1.2)
Total Protein: 7.6 g/dL (ref 6.5–8.1)

## 2023-04-18 MED ORDER — ACETAMINOPHEN 325 MG PO TABS
650.0000 mg | ORAL_TABLET | Freq: Once | ORAL | Status: AC
  Administered 2023-04-18: 650 mg via ORAL

## 2023-04-18 MED ORDER — SODIUM CHLORIDE 0.9 % IV SOLN
3.0000 mg/kg | Freq: Once | INTRAVENOUS | Status: AC
  Administered 2023-04-18: 200 mg via INTRAVENOUS
  Filled 2023-04-18: qty 10

## 2023-04-18 MED ORDER — SODIUM CHLORIDE 0.9% FLUSH
10.0000 mL | INTRAVENOUS | Status: DC | PRN
  Administered 2023-04-18: 10 mL

## 2023-04-18 MED ORDER — HEPARIN SOD (PORK) LOCK FLUSH 100 UNIT/ML IV SOLN
500.0000 [IU] | Freq: Once | INTRAVENOUS | Status: AC | PRN
  Administered 2023-04-18: 500 [IU]

## 2023-04-18 MED ORDER — CETIRIZINE HCL 10 MG/ML IV SOLN
10.0000 mg | Freq: Once | INTRAVENOUS | Status: AC
  Administered 2023-04-18: 10 mg via INTRAVENOUS

## 2023-04-18 MED ORDER — PROCHLORPERAZINE MALEATE 10 MG PO TABS
10.0000 mg | ORAL_TABLET | Freq: Once | ORAL | Status: AC
  Administered 2023-04-18: 10 mg via ORAL

## 2023-04-18 MED ORDER — SODIUM CHLORIDE 0.9 % IV SOLN: Freq: Once | INTRAVENOUS | Status: AC

## 2023-04-18 MED ORDER — SODIUM CHLORIDE 0.9% FLUSH
10.0000 mL | Freq: Once | INTRAVENOUS | Status: AC
  Administered 2023-04-18 (×2): 10 mL

## 2023-04-22 ENCOUNTER — Ambulatory Visit: Payer: PPO | Attending: Plastic Surgery

## 2023-04-22 VITALS — Wt 149.0 lb

## 2023-04-22 DIAGNOSIS — Z483 Aftercare following surgery for neoplasm: Secondary | ICD-10-CM

## 2023-04-22 NOTE — Therapy (Signed)
OUTPATIENT PHYSICAL THERAPY SOZO SCREENING NOTE   Patient Name: Ashley Robertson MRN: [id number] DOB:27-Feb-1956, 67 y.o., female Today's Date: 04/22/2023  PCP: Gweneth Dimitri, MD REFERRING PROVIDER: Glenna Fellows, MD   PT End of Session - 04/22/23 [phone]     Visit Number 2   # unchanged due to screen only   PT Start Time 0805    PT Stop Time 0809    PT Time Calculation (min) 4 min    Activity Tolerance Patient tolerated treatment well    Behavior During Therapy [facility] for tasks assessed/performed             Past Medical History:  Diagnosis Date   Allergy    Lisinopril and lipitor   Anxiety and depression    Aortic atherosclerosis (HCC) 03/2020   CT Chest: 2 V (LAD & LCx) Coronary Atherosclerosis, Aortic Atherosclerosis (no aneurysm).  Mild centrilobular emphysema with mild diffuse bronchial thickening; several scattered small solitary pulmonary nodules (largest 5.6 mm in anterior left upper lobe)   Breast cancer (HCC) 12/2021   Right breast ILC   Carotid artery plaque, bilateral 10/2015   Mild to moderate plaque L>R without significant stenosis   COPD (chronic obstructive pulmonary disease) (HCC)    Coronary Artery Calcification - Score 79    Coronary Calcium Score 79.  LAD and circumflex calcification noted.  Normal ascending aorta with mild calcification.   Current every day smoker    pt quit smoking April 2023   Emphysema lung [facility])    Noted on chest CT   Family history of breast cancer 02/21/2022   Family history of prostate cancer 02/21/2022   Hyperlipidemia    Hypertension    Controlled with amlodipine   Prediabetes    Skin cancer 2021   Remove 2022   Past Surgical History:  Procedure Laterality Date   ABDOMINAL AORTIC ANEURYSM REPAIR N/A 01/11/2023   Procedure: ANEURYSM ABDOMINAL AORTIC REPAIR;  Surgeon: Nada Libman, MD;  Location: [facility] OR;  Service: Vascular;  Laterality: N/A;   BREAST BIOPSY Right    times 3   BREAST RECONSTRUCTION WITH PLACEMENT OF  TISSUE EXPANDER AND ALLODERM Right 07/26/2022   Procedure: RIGHT BREAST RECONSTRUCTION WITH PLACEMENT OF TISSUE EXPANDER AND ALLODERM;  Surgeon: Glenna Fellows, MD;  Location: [facility] OR;  Service: Plastics;  Laterality: Right;   CESAREAN SECTION  1990, 1991   COSMETIC SURGERY  1981   Rhinoplasty   MASTECTOMY Right    MASTECTOMY W/ SENTINEL NODE BIOPSY Right 07/26/2022   Procedure: RIGHT MASTECTOMY, RIGHT AXILLARY SENTINEL NODE BIOPSY;  Surgeon: Emelia Loron, MD;  Location: [facility] OR;  Service: General;  Laterality: Right;  GEN & PEC BLOCK   PORTACATH PLACEMENT Left 03/06/2022   Procedure: INSERTION PORT-A-CATH;  Surgeon: Emelia Loron, MD;  Location: [facility];  Service: General;  Laterality: Left;   RADIOACTIVE SEED GUIDED AXILLARY SENTINEL LYMPH NODE Right 07/26/2022   Procedure: RADIOACTIVE SEED GUIDED AXILLARY SENTINEL LYMPH NODE DISSECTION;  Surgeon: Emelia Loron, MD;  Location: [facility];  Service: General;  Laterality: Right;   RHINOPLASTY  1981   TONSILLECTOMY  1978   TRANSTHORACIC ECHOCARDIOGRAM  10/2016   EF 55 to 60%.  Normal systolic and diastolic function.  No ASD/PFO   TUBAL LIGATION  1991   Patient Active Problem List   Diagnosis Date Noted   AAA (abdominal aortic aneurysm) (HCC) 01/11/2023   S/P AAA repair 01/11/2023   Osteopenia 10/21/2022   Aortic atherosclerosis (HCC) 08/10/2022   S/P mastectomy,  right 07/26/2022   Carotid atherosclerosis 03/28/2022   Coronary artery disease 03/28/2022   Pulmonary emphysema (HCC) 03/28/2022   Recurrent major depression in remission (HCC) 03/28/2022   Port-A-Cath in place 03/27/2022   Genetic testing 03/12/2022   Family history of breast cancer 02/21/2022   Family history of prostate cancer 02/21/2022   Hypercholesterolemia 02/21/2022   Malignant neoplasm of overlapping sites of right breast in female, estrogen receptor positive (HCC) 02/16/2022   Bilateral carotid bruits 04/26/2020   Essential  hypertension 03/19/2020   Hyperlipidemia with target LDL less than 100 03/19/2020   Prediabetes 03/19/2020   Agatston CAC score, <100 03/15/2020    REFERRING DIAG: Rt breast cancer at risk for lymphedema  THERAPY DIAG: Aftercare following surgery for neoplasm  PERTINENT HISTORY: Patient was diagnosed on 02/08/2022 with right grade II invasive lobular carcinoma breast cancer. It measured 2.6 cm and 3.3 cm (2 masses) and is located in the upper outer quadrant. It is ER positive, PR negative, and HER2 positive with a Ki67 of 10%. Patient has COPD and hypertension and a 5cm AAA. She is-s/p right mastectomy  with SLNB 07/26/2022 by Dr. Dwain Sarna and reconstruction by Dr. Leta Baptist. Path showed 9.5 cm invasive lobular carcinoma involving dermis of nipple, margins negative, 2/5 positive lymph nodes. -given the significant residual disease, the recommendation is for postmastectomy radiation and switch to adjuvant Kadcyla  PRECAUTIONS: right UE Lymphedema risk, None  SUBJECTIVE: Pt returns for her 3 month L-Dex screen.   PAIN:  Are you having pain? No  SOZO SCREENING: Patient was assessed today using the SOZO machine to determine the lymphedema index score. This was compared to her baseline score. It was determined that she is within the recommended range when compared to her baseline and no further action is needed at this time. She will continue SOZO screenings. These are done every 3 months for 2 years post operatively followed by every 6 months for 2 years, and then annually.   L-DEX FLOWSHEETS - 04/22/23 0800       L-DEX LYMPHEDEMA SCREENING   Measurement Type Unilateral    L-DEX MEASUREMENT EXTREMITY Upper Extremity    POSITION  Standing    DOMINANT SIDE Right    At Risk Side Right    BASELINE SCORE (UNILATERAL) -0.2    L-DEX SCORE (UNILATERAL) 3.3    VALUE CHANGE (UNILAT) 3.5               Hermenia Bers, PTA 04/22/2023, 8:08 AM

## 2023-04-26 ENCOUNTER — Encounter: Payer: Self-pay | Admitting: Genetic Counselor

## 2023-04-26 NOTE — Progress Notes (Signed)
UPDATE: BRIP1 p.F934V VUS has been amended to Likely Benign. Amended report date is 11/06/2022.

## 2023-04-29 ENCOUNTER — Other Ambulatory Visit: Payer: Self-pay | Admitting: Internal Medicine

## 2023-04-30 ENCOUNTER — Other Ambulatory Visit (HOSPITAL_COMMUNITY): Payer: Self-pay

## 2023-04-30 MED ORDER — CARVEDILOL 6.25 MG PO TABS
6.2500 mg | ORAL_TABLET | Freq: Two times a day (BID) | ORAL | 4 refills | Status: DC
  Filled 2023-04-30: qty 60, 30d supply, fill #0
  Filled 2023-05-24: qty 60, 30d supply, fill #1
  Filled 2023-06-27: qty 60, 30d supply, fill #2
  Filled 2023-07-25: qty 60, 30d supply, fill #3
  Filled 2023-08-18: qty 60, 30d supply, fill #4

## 2023-05-01 ENCOUNTER — Other Ambulatory Visit (HOSPITAL_COMMUNITY): Payer: Self-pay

## 2023-05-02 ENCOUNTER — Ambulatory Visit: Payer: PPO | Admitting: Hematology

## 2023-05-02 ENCOUNTER — Other Ambulatory Visit (HOSPITAL_COMMUNITY): Payer: Self-pay

## 2023-05-02 ENCOUNTER — Ambulatory Visit: Payer: PPO

## 2023-05-02 ENCOUNTER — Other Ambulatory Visit: Payer: PPO

## 2023-05-02 ENCOUNTER — Other Ambulatory Visit: Payer: Self-pay

## 2023-05-09 ENCOUNTER — Inpatient Hospital Stay: Payer: PPO

## 2023-05-09 ENCOUNTER — Other Ambulatory Visit (HOSPITAL_COMMUNITY): Payer: Self-pay

## 2023-05-09 ENCOUNTER — Other Ambulatory Visit: Payer: Self-pay

## 2023-05-09 ENCOUNTER — Encounter: Payer: Self-pay | Admitting: *Deleted

## 2023-05-09 ENCOUNTER — Inpatient Hospital Stay: Payer: PPO | Attending: Hematology

## 2023-05-09 ENCOUNTER — Encounter: Payer: Self-pay | Admitting: Hematology

## 2023-05-09 ENCOUNTER — Inpatient Hospital Stay (HOSPITAL_BASED_OUTPATIENT_CLINIC_OR_DEPARTMENT_OTHER): Payer: PPO | Admitting: Hematology

## 2023-05-09 VITALS — BP 127/64 | HR 50 | Temp 98.6°F | Resp 18

## 2023-05-09 VITALS — BP 116/69 | HR 50 | Temp 97.9°F | Resp 18 | Ht 69.5 in | Wt 148.1 lb

## 2023-05-09 DIAGNOSIS — M8588 Other specified disorders of bone density and structure, other site: Secondary | ICD-10-CM

## 2023-05-09 DIAGNOSIS — Z79811 Long term (current) use of aromatase inhibitors: Secondary | ICD-10-CM | POA: Diagnosis not present

## 2023-05-09 DIAGNOSIS — Z170 Estrogen receptor positive status [ER+]: Secondary | ICD-10-CM | POA: Diagnosis not present

## 2023-05-09 DIAGNOSIS — Z95828 Presence of other vascular implants and grafts: Secondary | ICD-10-CM

## 2023-05-09 DIAGNOSIS — C773 Secondary and unspecified malignant neoplasm of axilla and upper limb lymph nodes: Secondary | ICD-10-CM | POA: Diagnosis not present

## 2023-05-09 DIAGNOSIS — C50811 Malignant neoplasm of overlapping sites of right female breast: Secondary | ICD-10-CM | POA: Diagnosis not present

## 2023-05-09 DIAGNOSIS — Z5112 Encounter for antineoplastic immunotherapy: Secondary | ICD-10-CM | POA: Diagnosis not present

## 2023-05-09 DIAGNOSIS — Z7982 Long term (current) use of aspirin: Secondary | ICD-10-CM | POA: Diagnosis not present

## 2023-05-09 DIAGNOSIS — D61818 Other pancytopenia: Secondary | ICD-10-CM | POA: Insufficient documentation

## 2023-05-09 DIAGNOSIS — Z79899 Other long term (current) drug therapy: Secondary | ICD-10-CM | POA: Insufficient documentation

## 2023-05-09 LAB — CMP (CANCER CENTER ONLY)
ALT: 24 U/L (ref 0–44)
AST: 42 U/L — ABNORMAL HIGH (ref 15–41)
Albumin: 3.8 g/dL (ref 3.5–5.0)
Alkaline Phosphatase: 219 U/L — ABNORMAL HIGH (ref 38–126)
Anion gap: 6 (ref 5–15)
BUN: 13 mg/dL (ref 8–23)
CO2: 28 mmol/L (ref 22–32)
Calcium: 9.5 mg/dL (ref 8.9–10.3)
Chloride: 105 mmol/L (ref 98–111)
Creatinine: 0.71 mg/dL (ref 0.44–1.00)
GFR, Estimated: >60 mL/min (ref >60)
Glucose, Bld: 110 mg/dL — ABNORMAL HIGH (ref 70–99)
Potassium: 4 mmol/L (ref 3.5–5.1)
Sodium: 139 mmol/L (ref 135–145)
Total Bilirubin: 0.6 mg/dL (ref 0.3–1.2)
Total Protein: 7.4 g/dL (ref 6.5–8.1)

## 2023-05-09 LAB — CBC WITH DIFFERENTIAL (CANCER CENTER ONLY)
Abs Immature Granulocytes: 0.01 10*3/uL (ref 0.00–0.07)
Basophils Absolute: 0.1 10*3/uL (ref 0.0–0.1)
Basophils Relative: 2 %
Eosinophils Absolute: 0.1 10*3/uL (ref 0.0–0.5)
Eosinophils Relative: 4 %
HCT: 35.5 % — ABNORMAL LOW (ref 36.0–46.0)
Hemoglobin: 11.1 g/dL — ABNORMAL LOW (ref 12.0–15.0)
Immature Granulocytes: 0 %
Lymphocytes Relative: 21 %
Lymphs Abs: 0.7 10*3/uL (ref 0.7–4.0)
MCH: 26.6 pg (ref 26.0–34.0)
MCHC: 31.3 g/dL (ref 30.0–36.0)
MCV: 84.9 fL (ref 80.0–100.0)
Monocytes Absolute: 0.4 10*3/uL (ref 0.1–1.0)
Monocytes Relative: 13 %
Neutro Abs: 2 10*3/uL (ref 1.7–7.7)
Neutrophils Relative %: 60 %
Platelet Count: 85 10*3/uL — ABNORMAL LOW (ref 150–400)
RBC: 4.18 MIL/uL (ref 3.87–5.11)
RDW: 17.3 % — ABNORMAL HIGH (ref 11.5–15.5)
WBC Count: 3.3 10*3/uL — ABNORMAL LOW (ref 4.0–10.5)
nRBC: 0 % (ref 0.0–0.2)

## 2023-05-09 MED ORDER — SODIUM CHLORIDE 0.9% FLUSH
10.0000 mL | Freq: Once | INTRAVENOUS | Status: AC
  Administered 2023-05-09: 10 mL

## 2023-05-09 MED ORDER — SODIUM CHLORIDE 0.9 % IV SOLN
3.0000 mg/kg | Freq: Once | INTRAVENOUS | Status: AC
  Administered 2023-05-09: 200 mg via INTRAVENOUS
  Filled 2023-05-09: qty 10

## 2023-05-09 MED ORDER — HEPARIN SOD (PORK) LOCK FLUSH 100 UNIT/ML IV SOLN
500.0000 [IU] | Freq: Once | INTRAVENOUS | Status: AC | PRN
  Administered 2023-05-09: 500 [IU]

## 2023-05-09 MED ORDER — SODIUM CHLORIDE 0.9 % IV SOLN: Freq: Once | INTRAVENOUS | Status: AC

## 2023-05-09 MED ORDER — PROCHLORPERAZINE MALEATE 10 MG PO TABS
10.0000 mg | ORAL_TABLET | Freq: Once | ORAL | Status: AC
  Administered 2023-05-09: 10 mg via ORAL
  Filled 2023-05-09: qty 1

## 2023-05-09 MED ORDER — ANASTROZOLE 1 MG PO TABS
1.0000 mg | ORAL_TABLET | Freq: Every day | ORAL | 3 refills | Status: DC
  Filled 2023-05-09 – 2023-05-24 (×2): qty 90, 90d supply, fill #0
  Filled 2023-09-03: qty 90, 90d supply, fill #1
  Filled 2023-11-20: qty 90, 90d supply, fill #2
  Filled 2024-02-23: qty 90, 90d supply, fill #3

## 2023-05-09 MED ORDER — CETIRIZINE HCL 10 MG/ML IV SOLN
10.0000 mg | Freq: Once | INTRAVENOUS | Status: AC
  Administered 2023-05-09: 10 mg via INTRAVENOUS
  Filled 2023-05-09: qty 1

## 2023-05-09 MED ORDER — ACETAMINOPHEN 325 MG PO TABS
650.0000 mg | ORAL_TABLET | Freq: Once | ORAL | Status: AC
  Administered 2023-05-09: 650 mg via ORAL
  Filled 2023-05-09: qty 2

## 2023-05-09 MED ORDER — SODIUM CHLORIDE 0.9% FLUSH
10.0000 mL | INTRAVENOUS | Status: DC | PRN
  Administered 2023-05-09: 10 mL

## 2023-05-09 NOTE — Progress Notes (Signed)
Per Dr. Latanya Maudlin note from 05/09/23 "PLAN: -Lab reviewed -Echo scheduled for 07/2023 -I refill Anastrozole for 90 days -proceed with  C12 kadcyla today  -lab/flushand Kadcyla 6/27"

## 2023-05-09 NOTE — Assessment & Plan Note (Signed)
-  Her most recent DEXA was on 04/29/20 showing osteopenia (T-score of -2.1 at L1-3). -continue calcium and vitD  -We discussed the negative impact on bone density from aromatase inhibitor -I previously recommended her to consider Zometa infusion every 6 months for 2 years, benefit and side effect discussed with her. She will think about it    

## 2023-05-09 NOTE — Assessment & Plan Note (Signed)
invasive lobular carcinoma, Stage IIIA, c(T3, N1), yp(T3, N1a), ER+/PR-/HER2+, Grade 2  -Diagnosed in 01/2022 -s/p neoadjuvant chemo TCHP X6, followed by right mastectomy by Dr. Wakefield and reconstruction by Dr. Thimmappa. Unfortunately she did not have much response to neoadjvuant chemo  -on adjuvant Kadcyla now, tolerating well with no noticeable side effects. Plan for a total of 14 treatment -she received postmastectomy radiation under Dr. Moody, 10/10 - 10/29/22. -she started adjuvant anastrozole in January 2024, she is tolerating very well -Echocardiogram in January 2024 showed slightly decreased EF 50 to 55%.  She was seen by cardiologist after echo, her cardiac medication was adjusted, and we will continue HER2 antibody.  repeated echo on 03/18/23 showed normal EF 55% 

## 2023-05-09 NOTE — Progress Notes (Signed)
[facility]   Telephone:([phone] [phone]   Clinic Follow up Note   Patient Care Team: Gweneth Dimitri, MD as PCP - General (Family Medicine) Marykay Lex, MD as PCP - Cardiology (Cardiology) Pershing Proud, RN as Oncology Nurse Navigator Donnelly Angelica, RN as Oncology Nurse Navigator Emelia Loron, MD as Consulting Physician (General Surgery) Malachy Mood, MD as Consulting Physician (Hematology) Dorothy Puffer, MD as Consulting Physician (Radiation Oncology)  Date of Service:  05/09/2023  CHIEF COMPLAINT: f/u of right breast cancer   CURRENT THERAPY:  Kadcyla, q21d, started 08/24/22, plan for a total of 14 cycles  Anastrozole started in 12/2022   ASSESSMENT:  Ashley Robertson is a 67 y.o. female with   Malignant neoplasm of overlapping sites of right breast in female, estrogen receptor positive (HCC) invasive lobular carcinoma, Stage IIIA, c(T3, N1), yp(T3, N1a), ER+/PR-/HER2+, Grade 2  -Diagnosed in 01/2022 -s/p neoadjuvant chemo TCHP X6, followed by right mastectomy by Dr. Dwain Sarna and reconstruction by Dr. Leta Baptist. Unfortunately she did not have much response to neoadjvuant chemo  -on adjuvant Kadcyla now, tolerating well with no noticeable side effects. Plan for a total of 14 treatment -she received postmastectomy radiation under Dr. Mitzi Hansen, 10/10 - 10/29/22. -she started adjuvant anastrozole in January 2024, she is tolerating very well -Echocardiogram in January 2024 showed slightly decreased EF 50 to 55%.  She was seen by cardiologist after echo, her cardiac medication was adjusted, and we will continue HER2 antibody.  repeated echo on 03/18/23 showed normal EF 55%  Osteopenia -Her most recent DEXA was on 04/29/20 showing osteopenia (T-score of -2.1 at L1-3). -continue calcium and vitD  -We discussed the negative impact on bone density from aromatase inhibitor -I previously recommended her to consider Zometa infusion every 6 months for 2  years, benefit and side effect discussed with her. She will think about it   Pancytopenia -She has mild leukopenia, anemia, and thrombocytopenia, likely related to previous chemo and current Kadcyla -Will monitor closely, no need for blood transfusion at this point.    PLAN: -Lab reviewed -Echo scheduled for 07/2023 -I refill Anastrozole for 90 days -proceed with  C12 kadcyla today  -lab/flushand Kadcyla 6/27  SUMMARY OF ONCOLOGIC HISTORY: Oncology History Overview Note   Cancer Staging  Malignant neoplasm of overlapping sites of right breast in female, estrogen receptor positive (HCC) Staging form: Breast, AJCC 8th Edition - Clinical stage from 02/09/2022: Stage IIA (cT2, cN1, cM0, G2, ER+, PR-, HER2+) - Signed by Malachy Mood, MD on 02/20/2022    Malignant neoplasm of overlapping sites of right breast in female, estrogen receptor positive (HCC)  02/08/2022 Mammogram   CLINICAL DATA:  Acute onset right breast lump.  EXAM: DIGITAL DIAGNOSTIC UNILATERAL RIGHT MAMMOGRAM WITH TOMOSYNTHESIS AND CAD; ULTRASOUND RIGHT BREAST LIMITED  IMPRESSION: 1. Highly suspicious findings mammographically and sonographically. I suspect there is significant malignancy throughout most of the right breast. Discrete masses are seen at 11 o'clock and 6 o'clock.  A single borderline lymph node is identified. This lymph node appears prominent compared to the remainder of the lymph nodes. The skin thickening suggests the possibility of inflammatory breast cancer.   02/09/2022 Cancer Staging   Staging form: Breast, AJCC 8th Edition - Clinical stage from 02/09/2022: Stage IIIA (cT3, cN1, cM0, G2, ER+, PR-, HER2+) - Signed by Malachy Mood, MD on 03/05/2022 Stage prefix: Initial diagnosis Histologic grading system: 3 grade system   02/09/2022 Initial Biopsy   Diagnosis 1. Breast, right, needle core  biopsy, right breast 11 o'clock mass ribbon clip - INVASIVE MAMMARY CARCINOMA - SEE COMMENT 2. Breast, right, needle core  biopsy, right breast 6'oclock mass, coil clip - INVASIVE MAMMARY CARCINOMA - SEE COMMENT 3. Lymph node, needle/core biopsy, right axillary lymph node, tribell clip - METASTATIC CARCINOMA INVOLVING NODAL TISSUE Microscopic Comment 1. and 2. The biopsy material shows an infiltrative proliferation of cells arranged linearly and in small clusters. Based on the biopsy, the carcinoma appears Nottingham grade 2 of 3 and measures 1.5 cm in greatest linear extent.  Addendum parts 1 and 2: Immunohistochemistry for E-cadherin is negative consistent with lobular carcinoma.  2. PROGNOSTIC INDICATORS Results: The tumor cells are EQUIVOCAL for Her2 (2+). Her2 by FISH will be performed and the results reported separately. Estrogen Receptor: 100%, POSITIVE, STRONG STAINING INTENSITY Progesterone Receptor: <1%, NEGATIVE Proliferation Marker Ki67: 10%  2. FLUORESCENCE IN-SITU HYBRIDIZATION Results: GROUP 1: HER2 **POSITIVE**   3. PROGNOSTIC INDICATORS Results: By immunohistochemistry, the tumor cells are POSITIVE for Her2 (3+). Estrogen Receptor: 100%, POSITIVE, STRONG STAINING INTENSITY Progesterone Receptor: <1%, NEGATIVE   02/16/2022 Initial Diagnosis   Malignant neoplasm of overlapping sites of right breast in female, estrogen receptor positive (HCC)   03/01/2022 Imaging   EXAM: BILATERAL BREAST MRI WITH AND WITHOUT CONTRAST  IMPRESSION: 1. 8.5 x 8.3 x 5.9 cm area of confluent mass-like enhancement in the right breast involving all 4 quadrants and containing 2 biopsy marker clip artifacts, compatible with 4 quadrant biopsy-proven malignancy. 2. Biopsy-proven metastatic lymph node in the right axilla. 3. Possible metastatic intramammary lymph node in the posterior outer right breast just above the level of the nipple. 4. No evidence of malignancy on the left.   03/01/2022 Genetic Testing   Negative hereditary cancer genetic testing: no pathogenic variants detected in [facility]  CustomNext-Cancer +RNAinsight Panel.  Variant of uncertain significance reported in BRIP1 at p.F934V (c.2800T>G). Report date is March 01, 2022.    The CustomNext-Cancer+RNAinsight panel offered by [employer] includes sequencing and rearrangement analysis for the following 47 genes:  APC, ATM, AXIN2, BARD1, BMPR1A, BRCA1, BRCA2, BRIP1, CDH1, CDK4, CDKN2A, CHEK2, DICER1, EPCAM, GREM1, HOXB13, MEN1, MLH1, MSH2, MSH3, MSH6, MUTYH, NBN, NF1, NF2, NTHL1, PALB2, PMS2, POLD1, POLE, PTEN, RAD51C, RAD51D, RECQL, RET, SDHA, SDHAF2, SDHB, SDHC, SDHD, SMAD4, SMARCA4, STK11, TP53, TSC1, TSC2, and VHL.  RNA data is routinely analyzed for use in variant interpretation for all genes.  UPDATE: BRIP1 p.F934V VUS has been amended to Likely Benign. Amended report date is 11/06/2022.    03/05/2022 Imaging   EXAM: CT CHEST, ABDOMEN, AND PELVIS WITH CONTRAST  IMPRESSION: 1. Asymmetric, masslike density of the glandular tissue of the right breast with overlying skin thickening, in keeping with known primary breast malignancy. 2. Enlarged, ill-defined right axillary lymph node containing a biopsy marking clip, consistent with known nodal metastatic disease. 3. No other evidence of lymphadenopathy or metastatic disease in the chest, abdomen, or pelvis. 4. Emphysema. Background of fine centrilobular nodularity, most concentrated in the lung apices, consistent with smoking-related respiratory bronchiolitis. 5. Aneurysm of the infrarenal abdominal aorta measuring up to 5.4 x 5.3 cm with a large burden of eccentric mural thrombus. Recommend follow-up CT/MR every 6 months and vascular consultation. This recommendation follows ACR consensus guidelines: White Paper of the ACR Incidental Findings Committee II on Vascular Findings. J Am Coll Radiol 2013; 10:789-794. 6. Coronary artery disease.   03/05/2022 Imaging   EXAM: NUCLEAR MEDICINE WHOLE BODY BONE SCAN  IMPRESSION: No evidence of bony metastatic disease.  03/07/2022  - 08/01/2022 Chemotherapy   Patient is on Treatment Plan : BREAST  Docetaxel + Carboplatin + Trastuzumab + Pertuzumab  (TCHP) q21d      07/26/2022 Surgery   Right mastectomy with axillary node seed guided excision converted to axillary node dissection with reconstruction by Drs. Dwain Sarna and Thimmappa   07/26/2022 Pathology Results   FINAL MICROSCOPIC DIAGNOSIS:   A. LYMPH NODE, RIGHT AXILLARY TARGETED, EXCISION:  - Metastatic carcinoma involving one lymph node, 2 cm (1/1).  - Focal extranodal extension.  - Biopsy site and biopsy clip.   B. BREAST, RIGHT, MASTECTOMY:  - Invasive lobular carcinoma, 9.5 cm (ypT3).  - Carcinoma involves dermis of nipple.  - All surgical margins negative for carcinoma.  - One lymph node negative for metastatic carcinoma (0/1).  - Biopsy sites and biopsy clips.  - See oncology table.   C. LYMPH NODE, RIGHT AXILLARY, SENTINEL, EXCISION:  - One lymph node negative for metastatic carcinoma (0/1).   D. LYMPH NODE, RIGHT AXILLARY, SENTINEL, EXCISION:  - Metastatic carcinoma in one lymph node, 0.5 cm. (1/1).   E. LYMPH NODE, RIGHT AXILLARY, SENTINEL, EXCISION:  - One lymph node negative for metastatic carcinoma (0/1).    07/26/2022 Cancer Staging   Cancer Staging  Malignant neoplasm of overlapping sites of right breast in female, estrogen receptor positive (HCC) Staging form: Breast, AJCC 8th Edition - Clinical stage from 02/09/2022: Stage IIIA (cT3, cN1, cM0, G2, ER+, PR-, HER2+) - Signed by Malachy Mood, MD on 03/05/2022 Stage prefix: Initial diagnosis Histologic grading system: 3 grade system - Pathologic stage from 07/26/2022: Stage IIIA (pT3, pN1a, cM0, G2, ER+, PR-, HER2+) - Signed by Pollyann Samples, NP on 08/01/2022 Histologic grading system: 3 grade system    07/26/2022 Cancer Staging   Staging form: Breast, AJCC 8th Edition - Pathologic stage from 07/26/2022: Stage IIIA (pT3, pN1a, cM0, G2, ER+, PR-, HER2+) - Signed by Pollyann Samples, NP on  08/01/2022 Histologic grading system: 3 grade system   08/24/2022 -  Chemotherapy   Patient is on Treatment Plan : BREAST ADO-Trastuzumab Emtansine (Kadcyla) q21d        INTERVAL HISTORY:  Ashley Robertson is here for a follow up of right breast cancer . She was last seen by NP Lacie on 04/18/2023. She presents to the clinic alone. Pt denies having numbness and tingling, no pain. Pt denies having any issues with the last treatment. She is doing clinically well. Pt denied having any problems with the Anastrozole. Her PCP prescribed Fosamax      All other systems were reviewed with the patient and are negative.  MEDICAL HISTORY:  Past Medical History:  Diagnosis Date   Allergy    Lisinopril and lipitor   Anxiety and depression    Aortic atherosclerosis (HCC) 03/2020   CT Chest: 2 V (LAD & LCx) Coronary Atherosclerosis, Aortic Atherosclerosis (no aneurysm).  Mild centrilobular emphysema with mild diffuse bronchial thickening; several scattered small solitary pulmonary nodules (largest 5.6 mm in anterior left upper lobe)   Breast cancer (HCC) 12/2021   Right breast ILC   Carotid artery plaque, bilateral 10/2015   Mild to moderate plaque L>R without significant stenosis   COPD (chronic obstructive pulmonary disease) (HCC)    Coronary Artery Calcification - Score 79    Coronary Calcium Score 79.  LAD and circumflex calcification noted.  Normal ascending aorta with mild calcification.   Current every day smoker    pt quit smoking April 2023   Emphysema  lung (HCC)    Noted on chest CT   Family history of breast cancer 02/21/2022   Family history of prostate cancer 02/21/2022   Hyperlipidemia    Hypertension    Controlled with amlodipine   Prediabetes    Skin cancer 2021   Remove 2022    SURGICAL HISTORY: Past Surgical History:  Procedure Laterality Date   ABDOMINAL AORTIC ANEURYSM REPAIR N/A 01/11/2023   Procedure: ANEURYSM ABDOMINAL AORTIC REPAIR;  Surgeon: Nada Libman, MD;   Location: [facility] OR;  Service: Vascular;  Laterality: N/A;   BREAST BIOPSY Right    times 3   BREAST RECONSTRUCTION WITH PLACEMENT OF TISSUE EXPANDER AND ALLODERM Right 07/26/2022   Procedure: RIGHT BREAST RECONSTRUCTION WITH PLACEMENT OF TISSUE EXPANDER AND ALLODERM;  Surgeon: Glenna Fellows, MD;  Location: [facility] OR;  Service: Plastics;  Laterality: Right;   CESAREAN SECTION  1990, 1991   COSMETIC SURGERY  1981   Rhinoplasty   MASTECTOMY Right    MASTECTOMY W/ SENTINEL NODE BIOPSY Right 07/26/2022   Procedure: RIGHT MASTECTOMY, RIGHT AXILLARY SENTINEL NODE BIOPSY;  Surgeon: Emelia Loron, MD;  Location: [facility] OR;  Service: General;  Laterality: Right;  GEN & PEC BLOCK   PORTACATH PLACEMENT Left 03/06/2022   Procedure: INSERTION PORT-A-CATH;  Surgeon: Emelia Loron, MD;  Location: [facility];  Service: General;  Laterality: Left;   RADIOACTIVE SEED GUIDED AXILLARY SENTINEL LYMPH NODE Right 07/26/2022   Procedure: RADIOACTIVE SEED GUIDED AXILLARY SENTINEL LYMPH NODE DISSECTION;  Surgeon: Emelia Loron, MD;  Location: [facility] OR;  Service: General;  Laterality: Right;   RHINOPLASTY  1981   TONSILLECTOMY  1978   TRANSTHORACIC ECHOCARDIOGRAM  10/2016   EF 55 to 60%.  Normal systolic and diastolic function.  No ASD/PFO   TUBAL LIGATION  1991    I have reviewed the social history and family history with the patient and they are unchanged from previous note.  ALLERGIES:  is allergic to lisinopril and lipitor [atorvastatin].  MEDICATIONS:  Current Outpatient Medications  Medication Sig Dispense Refill   albuterol (PROVENTIL HFA) 108 (90 Base) MCG/ACT inhaler Inhale 1 to 2 puffs into the lungs every 6 hours as needed 8.5 g 2   alendronate (FOSAMAX) 70 MG tablet Take 1 tablet by mouth once weekly --Take 30 minutes before the first food, beverage or medicine of the day with plain water 12 tablet 3   amLODipine (NORVASC) 10 MG tablet Take 1 tablet (10 mg total) by mouth at  bedtime. 90 tablet 3   anastrozole (ARIMIDEX) 1 MG tablet Take 1 tablet (1 mg total) by mouth daily. 90 tablet 3   aspirin 81 MG tablet Take 81 mg by mouth at bedtime.     Calcium Carb-Cholecalciferol (CALCIUM 500 + D3 PO) Take 1 tablet by mouth daily.     carvedilol (COREG) 6.25 MG tablet Take 1 tablet (6.25 mg total) by mouth 2 (two) times daily. 60 tablet 4   Cholecalciferol (VITAMIN D) 50 MCG (2000 UT) CAPS Take 1 capsule by mouth daily. 30 capsule 2   ezetimibe (ZETIA) 10 MG tablet Take 1 tablet (10 mg total) by mouth daily. 90 tablet 3   FLUoxetine (PROZAC) 20 MG capsule Take 1 capsule by mouth once daily 90 capsule 3   Fluticasone-Umeclidin-Vilant (TRELEGY ELLIPTA) 200-62.5-25 MCG/ACT AEPB Inhale 1 puff into the lungs daily. 60 each 2   ibuprofen (ADVIL) 200 MG tablet Take 600 mg by mouth every 6 (six) hours as needed for moderate pain.  OVER THE COUNTER MEDICATION Take 1 capsule by mouth daily. Serrapeptase 120,000 units daily  Reported on 12/27/2015     rosuvastatin (CRESTOR) 5 MG tablet Take 1 tablet by mouth daily at bedtime ,except on Monday, Wednesday and Fridays take 2 tablets at bedtime as directed 180 tablet 3   Specialty Vitamins Products (MAGNESIUM, AMINO ACID CHELATE,) 133 MG tablet Take 1 tablet by mouth at bedtime.     No current facility-administered medications for this visit.   Facility-Administered Medications Ordered in Other Visits  Medication Dose Route Frequency Provider Last Rate Last Admin   ado-trastuzumab emtansine (KADCYLA) 200 mg in sodium chloride 0.9 % 250 mL chemo infusion  3 mg/kg (Treatment Plan Recorded) Intravenous Once Malachy Mood, MD 520 mL/hr at 05/09/23 1011 200 mg at 05/09/23 1011   heparin lock flush 100 unit/mL  500 Units Intracatheter Once PRN Malachy Mood, MD       sodium chloride flush (NS) 0.9 % injection 10 mL  10 mL Intracatheter PRN Malachy Mood, MD        PHYSICAL EXAMINATION: ECOG PERFORMANCE STATUS: 0 - Asymptomatic  Vitals:   05/09/23  0845  BP: 116/69  Pulse: (!) 50  Resp: 18  Temp: 97.9 F (36.6 C)  SpO2: 97%   Wt Readings from Last 3 Encounters:  05/09/23 148 lb 1.6 oz (67.2 kg)  04/22/23 149 lb (67.6 kg)  04/18/23 150 lb (68 kg)     GENERAL:alert, no distress and comfortable SKIN: skin color normal, no rashes or significant lesions EYES: normal, Conjunctiva are pink and non-injected, sclera clear  NEURO: alert & oriented x 3 with fluent speech   LABORATORY DATA:  I have reviewed the data as listed    Latest Ref Rng & Units 05/09/2023    8:02 AM 04/18/2023    8:47 AM 04/04/2023    9:13 AM  CBC  WBC 4.0 - 10.5 K/uL 3.3  4.3  4.1   Hemoglobin 12.0 - 15.0 g/dL 16.1  09.6  04.5   Hematocrit 36.0 - 46.0 % 35.5  36.0  33.1   Platelets 150 - 400 K/uL 85  83  78         Latest Ref Rng & Units 05/09/2023    8:02 AM 04/18/2023    8:47 AM 04/04/2023    9:13 AM  CMP  Glucose 70 - 99 mg/dL 409  811  914   BUN 8 - 23 mg/dL 13  13  12    Creatinine 0.44 - 1.00 mg/dL 7.82  9.56  2.13   Sodium 135 - 145 mmol/L 139  137  139   Potassium 3.5 - 5.1 mmol/L 4.0  4.1  3.6   Chloride 98 - 111 mmol/L 105  103  102   CO2 22 - 32 mmol/L 28  28  30    Calcium 8.9 - 10.3 mg/dL 9.5  9.2  9.5   Total Protein 6.5 - 8.1 g/dL 7.4  7.6  7.5   Total Bilirubin 0.3 - 1.2 mg/dL 0.6  0.6  0.7   Alkaline Phos 38 - 126 U/L 219  178  187   AST 15 - 41 U/L 42  32  35   ALT 0 - 44 U/L 24  20  20        RADIOGRAPHIC STUDIES: I have personally reviewed the radiological images as listed and agreed with the findings in the report. No results found.    No orders of the defined types were placed in this  encounter.  All questions were answered. The patient knows to call the clinic with any problems, questions or concerns. No barriers to learning was detected. The total time spent in the appointment was 25 minutes.     Malachy Mood, MD 05/09/2023   Carolin Coy, CMA, am acting as scribe for Malachy Mood, MD.   I have reviewed the above  documentation for accuracy and completeness, and I agree with the above.

## 2023-05-12 IMAGING — MG MM DIGITAL SCREENING BILAT W/ TOMO AND CAD
6 of 10 series · 6 of 30 positions shown · non-contrast
Comparison: Previous exam(s).

CLINICAL DATA: Screening.

EXAM:
DIGITAL SCREENING BILATERAL MAMMOGRAM WITH TOMOSYNTHESIS AND CAD
TECHNIQUE: Bilateral screening digital craniocaudal and mediolateral oblique
mammograms were obtained. Bilateral screening digital breast
tomosynthesis was performed. The images were evaluated with
computer-aided detection.

[R MLO synth-2D]
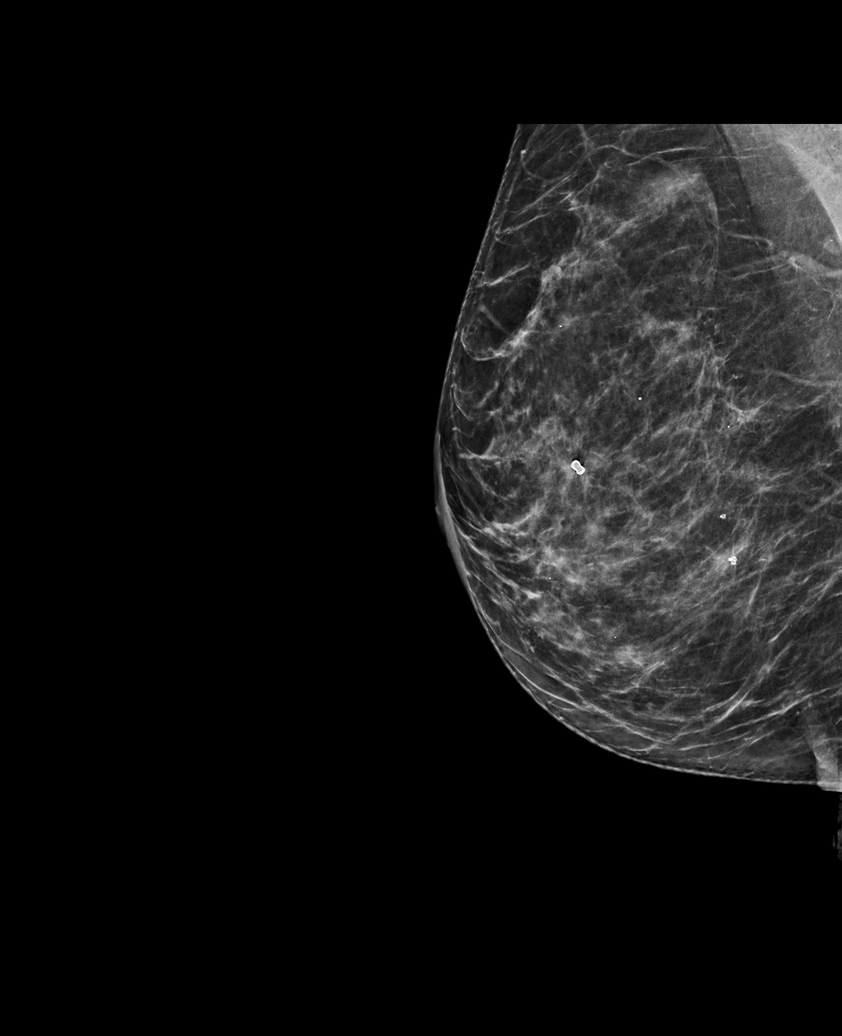

[L CC synth-2D (1 of 2)]
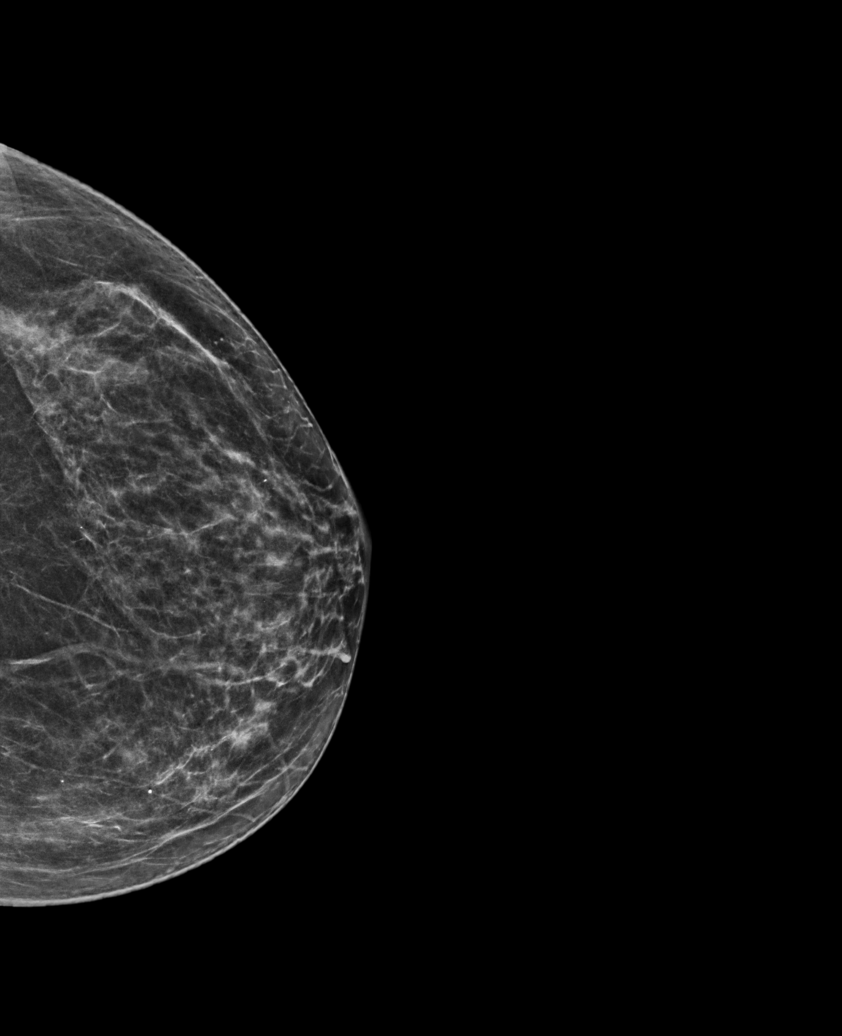

[R CC synth-2D]
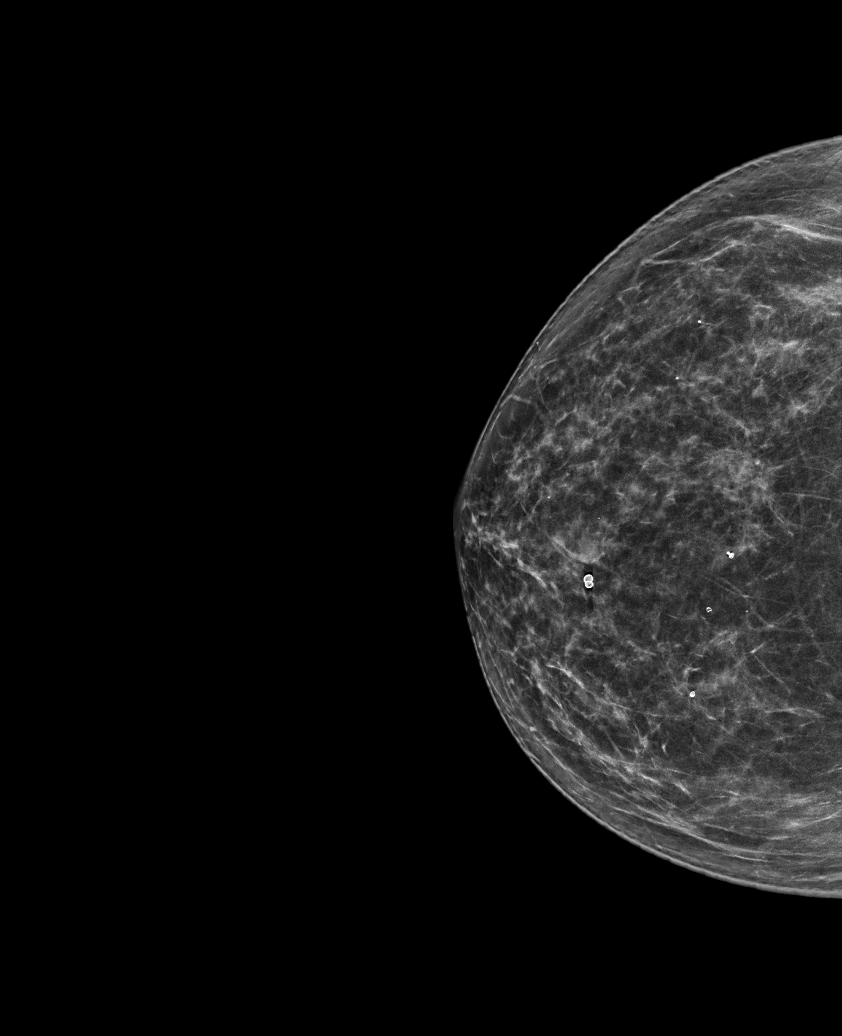

[L MLO synth-2D]
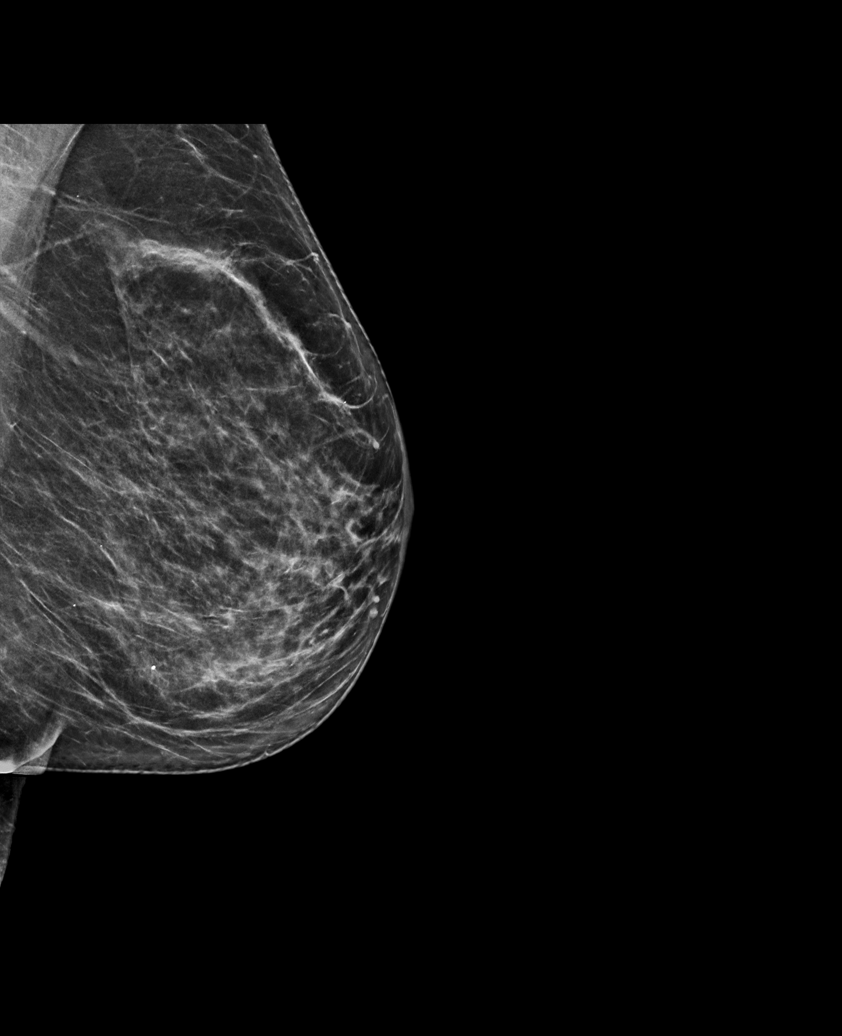

[L CC synth-2D (2 of 2)]
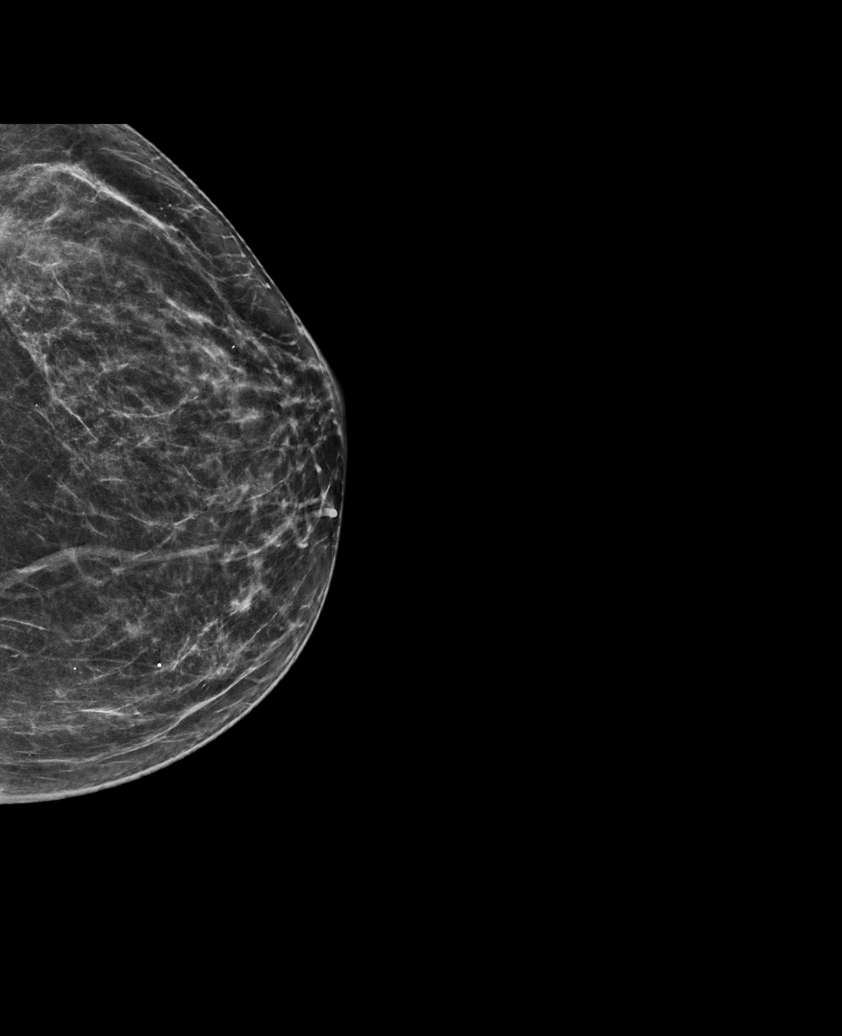

[L CC tomo · tomo slice 34/67.0]
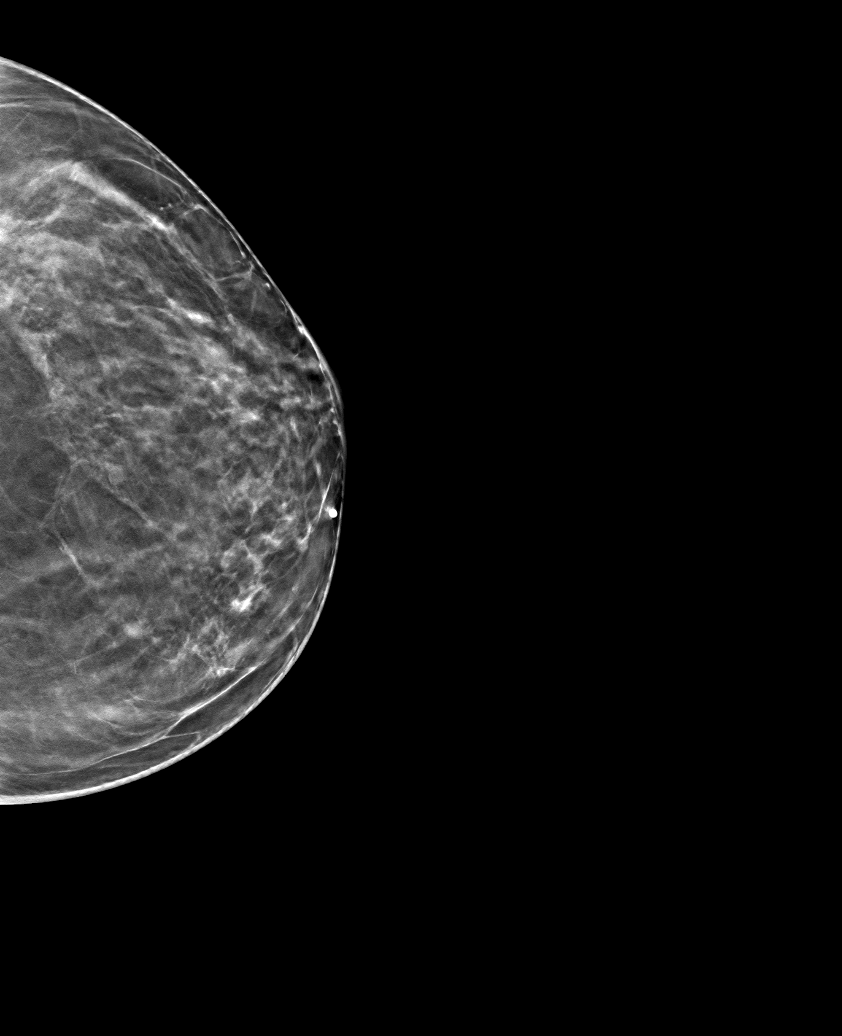

[6 of 30 positions shown; findings below may reference images not displayed]

ACR Breast Density Category b: There are scattered areas of
fibroglandular density.
FINDINGS: There are no findings suspicious for malignancy.
IMPRESSION: No mammographic evidence of malignancy. A result letter of this
screening mammogram will be mailed directly to the patient.

RECOMMENDATION:
Screening mammogram in one year. (Code:51-O-LD2)

BI-RADS CATEGORY  1: Negative.

## 2023-05-24 ENCOUNTER — Other Ambulatory Visit (HOSPITAL_COMMUNITY): Payer: Self-pay

## 2023-05-29 ENCOUNTER — Other Ambulatory Visit (HOSPITAL_COMMUNITY): Payer: Self-pay

## 2023-05-30 ENCOUNTER — Inpatient Hospital Stay: Payer: PPO

## 2023-05-30 ENCOUNTER — Other Ambulatory Visit (HOSPITAL_COMMUNITY): Payer: Self-pay

## 2023-05-30 ENCOUNTER — Other Ambulatory Visit: Payer: Self-pay

## 2023-05-30 VITALS — BP 131/66 | HR 48 | Temp 98.0°F | Resp 18 | Wt 145.5 lb

## 2023-05-30 DIAGNOSIS — Z170 Estrogen receptor positive status [ER+]: Secondary | ICD-10-CM

## 2023-05-30 DIAGNOSIS — Z5112 Encounter for antineoplastic immunotherapy: Secondary | ICD-10-CM | POA: Diagnosis not present

## 2023-05-30 DIAGNOSIS — Z95828 Presence of other vascular implants and grafts: Secondary | ICD-10-CM

## 2023-05-30 LAB — CBC WITH DIFFERENTIAL (CANCER CENTER ONLY)
Abs Immature Granulocytes: 0.01 10*3/uL (ref 0.00–0.07)
Basophils Absolute: 0.1 10*3/uL (ref 0.0–0.1)
Basophils Relative: 2 %
Eosinophils Absolute: 0.2 10*3/uL (ref 0.0–0.5)
Eosinophils Relative: 5 %
HCT: 35 % — ABNORMAL LOW (ref 36.0–46.0)
Hemoglobin: 11.1 g/dL — ABNORMAL LOW (ref 12.0–15.0)
Immature Granulocytes: 0 %
Lymphocytes Relative: 18 %
Lymphs Abs: 0.7 10*3/uL (ref 0.7–4.0)
MCH: 26.9 pg (ref 26.0–34.0)
MCHC: 31.7 g/dL (ref 30.0–36.0)
MCV: 85 fL (ref 80.0–100.0)
Monocytes Absolute: 0.5 10*3/uL (ref 0.1–1.0)
Monocytes Relative: 13 %
Neutro Abs: 2.3 10*3/uL (ref 1.7–7.7)
Neutrophils Relative %: 62 %
Platelet Count: 92 10*3/uL — ABNORMAL LOW (ref 150–400)
RBC: 4.12 MIL/uL (ref 3.87–5.11)
RDW: 18.3 % — ABNORMAL HIGH (ref 11.5–15.5)
WBC Count: 3.7 10*3/uL — ABNORMAL LOW (ref 4.0–10.5)
nRBC: 0 % (ref 0.0–0.2)

## 2023-05-30 LAB — CMP (CANCER CENTER ONLY)
ALT: 26 U/L (ref 0–44)
AST: 45 U/L — ABNORMAL HIGH (ref 15–41)
Albumin: 3.6 g/dL (ref 3.5–5.0)
Alkaline Phosphatase: 193 U/L — ABNORMAL HIGH (ref 38–126)
Anion gap: 6 (ref 5–15)
BUN: 11 mg/dL (ref 8–23)
CO2: 28 mmol/L (ref 22–32)
Calcium: 9.2 mg/dL (ref 8.9–10.3)
Chloride: 105 mmol/L (ref 98–111)
Creatinine: 0.62 mg/dL (ref 0.44–1.00)
GFR, Estimated: >60 mL/min (ref >60)
Glucose, Bld: 102 mg/dL — ABNORMAL HIGH (ref 70–99)
Potassium: 4 mmol/L (ref 3.5–5.1)
Sodium: 139 mmol/L (ref 135–145)
Total Bilirubin: 0.6 mg/dL (ref 0.3–1.2)
Total Protein: 7.4 g/dL (ref 6.5–8.1)

## 2023-05-30 MED ORDER — HEPARIN SOD (PORK) LOCK FLUSH 100 UNIT/ML IV SOLN
500.0000 [IU] | Freq: Once | INTRAVENOUS | Status: AC | PRN
  Administered 2023-05-30: 500 [IU]

## 2023-05-30 MED ORDER — SODIUM CHLORIDE 0.9% FLUSH
10.0000 mL | Freq: Once | INTRAVENOUS | Status: AC
  Administered 2023-05-30: 10 mL

## 2023-05-30 MED ORDER — SODIUM CHLORIDE 0.9 % IV SOLN: Freq: Once | INTRAVENOUS | Status: AC

## 2023-05-30 MED ORDER — SODIUM CHLORIDE 0.9% FLUSH
10.0000 mL | INTRAVENOUS | Status: DC | PRN
  Administered 2023-05-30: 10 mL

## 2023-05-30 MED ORDER — SODIUM CHLORIDE 0.9 % IV SOLN
3.0000 mg/kg | Freq: Once | INTRAVENOUS | Status: AC
  Administered 2023-05-30: 200 mg via INTRAVENOUS
  Filled 2023-05-30: qty 10

## 2023-05-30 MED ORDER — CETIRIZINE HCL 10 MG/ML IV SOLN
10.0000 mg | Freq: Once | INTRAVENOUS | Status: AC
  Administered 2023-05-30: 10 mg via INTRAVENOUS
  Filled 2023-05-30: qty 1

## 2023-05-30 MED ORDER — PROCHLORPERAZINE MALEATE 10 MG PO TABS
10.0000 mg | ORAL_TABLET | Freq: Once | ORAL | Status: AC
  Administered 2023-05-30: 10 mg via ORAL
  Filled 2023-05-30: qty 1

## 2023-05-30 MED ORDER — ACETAMINOPHEN 325 MG PO TABS
650.0000 mg | ORAL_TABLET | Freq: Once | ORAL | Status: AC
  Administered 2023-05-30: 650 mg via ORAL
  Filled 2023-05-30: qty 2

## 2023-06-19 ENCOUNTER — Other Ambulatory Visit (HOSPITAL_COMMUNITY): Payer: Self-pay

## 2023-06-20 ENCOUNTER — Inpatient Hospital Stay: Payer: PPO | Admitting: Hematology

## 2023-06-20 ENCOUNTER — Telehealth: Payer: Self-pay | Admitting: Pharmacist

## 2023-06-20 ENCOUNTER — Telehealth: Payer: Self-pay | Admitting: Pharmacy Technician

## 2023-06-20 ENCOUNTER — Inpatient Hospital Stay: Payer: PPO

## 2023-06-20 ENCOUNTER — Encounter: Payer: Self-pay | Admitting: Hematology

## 2023-06-20 ENCOUNTER — Other Ambulatory Visit (HOSPITAL_COMMUNITY): Payer: Self-pay

## 2023-06-20 ENCOUNTER — Other Ambulatory Visit: Payer: Self-pay

## 2023-06-20 ENCOUNTER — Inpatient Hospital Stay: Payer: PPO | Attending: Hematology

## 2023-06-20 VITALS — BP 130/83 | HR 51 | Temp 98.3°F | Resp 18 | Ht 69.5 in | Wt 143.9 lb

## 2023-06-20 DIAGNOSIS — Z170 Estrogen receptor positive status [ER+]: Secondary | ICD-10-CM | POA: Diagnosis not present

## 2023-06-20 DIAGNOSIS — M8588 Other specified disorders of bone density and structure, other site: Secondary | ICD-10-CM | POA: Insufficient documentation

## 2023-06-20 DIAGNOSIS — C773 Secondary and unspecified malignant neoplasm of axilla and upper limb lymph nodes: Secondary | ICD-10-CM | POA: Diagnosis not present

## 2023-06-20 DIAGNOSIS — Z95828 Presence of other vascular implants and grafts: Secondary | ICD-10-CM

## 2023-06-20 DIAGNOSIS — Z5112 Encounter for antineoplastic immunotherapy: Secondary | ICD-10-CM | POA: Diagnosis not present

## 2023-06-20 DIAGNOSIS — D61818 Other pancytopenia: Secondary | ICD-10-CM | POA: Insufficient documentation

## 2023-06-20 DIAGNOSIS — Z79811 Long term (current) use of aromatase inhibitors: Secondary | ICD-10-CM | POA: Diagnosis not present

## 2023-06-20 DIAGNOSIS — C50811 Malignant neoplasm of overlapping sites of right female breast: Secondary | ICD-10-CM

## 2023-06-20 DIAGNOSIS — Z79899 Other long term (current) drug therapy: Secondary | ICD-10-CM | POA: Insufficient documentation

## 2023-06-20 DIAGNOSIS — Z7982 Long term (current) use of aspirin: Secondary | ICD-10-CM | POA: Diagnosis not present

## 2023-06-20 DIAGNOSIS — C50911 Malignant neoplasm of unspecified site of right female breast: Secondary | ICD-10-CM

## 2023-06-20 LAB — CBC WITH DIFFERENTIAL (CANCER CENTER ONLY)
Abs Immature Granulocytes: 0.02 10*3/uL (ref 0.00–0.07)
Basophils Absolute: 0.1 10*3/uL (ref 0.0–0.1)
Basophils Relative: 1 %
Eosinophils Absolute: 0.2 10*3/uL (ref 0.0–0.5)
Eosinophils Relative: 4 %
HCT: 36 % (ref 36.0–46.0)
Hemoglobin: 11.7 g/dL — ABNORMAL LOW (ref 12.0–15.0)
Immature Granulocytes: 0 %
Lymphocytes Relative: 15 %
Lymphs Abs: 0.7 10*3/uL (ref 0.7–4.0)
MCH: 27.2 pg (ref 26.0–34.0)
MCHC: 32.5 g/dL (ref 30.0–36.0)
MCV: 83.7 fL (ref 80.0–100.0)
Monocytes Absolute: 0.6 10*3/uL (ref 0.1–1.0)
Monocytes Relative: 13 %
Neutro Abs: 3.2 10*3/uL (ref 1.7–7.7)
Neutrophils Relative %: 67 %
Platelet Count: 109 10*3/uL — ABNORMAL LOW (ref 150–400)
RBC: 4.3 MIL/uL (ref 3.87–5.11)
RDW: 18.7 % — ABNORMAL HIGH (ref 11.5–15.5)
WBC Count: 4.8 10*3/uL (ref 4.0–10.5)
nRBC: 0 % (ref 0.0–0.2)

## 2023-06-20 LAB — CMP (CANCER CENTER ONLY)
ALT: 29 U/L (ref 0–44)
AST: 53 U/L — ABNORMAL HIGH (ref 15–41)
Albumin: 3.8 g/dL (ref 3.5–5.0)
Alkaline Phosphatase: 207 U/L — ABNORMAL HIGH (ref 38–126)
Anion gap: 6 (ref 5–15)
BUN: 14 mg/dL (ref 8–23)
CO2: 29 mmol/L (ref 22–32)
Calcium: 9.5 mg/dL (ref 8.9–10.3)
Chloride: 101 mmol/L (ref 98–111)
Creatinine: 0.73 mg/dL (ref 0.44–1.00)
GFR, Estimated: >60 mL/min (ref >60)
Glucose, Bld: 104 mg/dL — ABNORMAL HIGH (ref 70–99)
Potassium: 3.9 mmol/L (ref 3.5–5.1)
Sodium: 136 mmol/L (ref 135–145)
Total Bilirubin: 0.7 mg/dL (ref 0.3–1.2)
Total Protein: 7.5 g/dL (ref 6.5–8.1)

## 2023-06-20 MED ORDER — SODIUM CHLORIDE 0.9% FLUSH
10.0000 mL | INTRAVENOUS | Status: DC | PRN
  Administered 2023-06-20: 10 mL

## 2023-06-20 MED ORDER — NERATINIB MALEATE 40 MG PO TABS
160.0000 mg | ORAL_TABLET | Freq: Every day | ORAL | 0 refills | Status: DC
  Filled 2023-06-20: qty 150, 37d supply, fill #0

## 2023-06-20 MED ORDER — SODIUM CHLORIDE 0.9 % IV SOLN: Freq: Once | INTRAVENOUS | Status: AC

## 2023-06-20 MED ORDER — CETIRIZINE HCL 10 MG/ML IV SOLN
10.0000 mg | Freq: Once | INTRAVENOUS | Status: AC
  Administered 2023-06-20: 10 mg via INTRAVENOUS
  Filled 2023-06-20: qty 1

## 2023-06-20 MED ORDER — PROCHLORPERAZINE MALEATE 10 MG PO TABS
10.0000 mg | ORAL_TABLET | Freq: Once | ORAL | Status: AC
  Administered 2023-06-20: 10 mg via ORAL
  Filled 2023-06-20: qty 1

## 2023-06-20 MED ORDER — SODIUM CHLORIDE 0.9 % IV SOLN
3.0000 mg/kg | Freq: Once | INTRAVENOUS | Status: AC
  Administered 2023-06-20: 200 mg via INTRAVENOUS
  Filled 2023-06-20: qty 10

## 2023-06-20 MED ORDER — BUDESONIDE ER 9 MG PO CP24
9.0000 mg | ORAL_CAPSULE | Freq: Every day | ORAL | 1 refills | Status: DC
  Filled 2023-06-20: qty 30, 30d supply, fill #0

## 2023-06-20 MED ORDER — SODIUM CHLORIDE 0.9% FLUSH
10.0000 mL | Freq: Once | INTRAVENOUS | Status: AC
  Administered 2023-06-20: 10 mL

## 2023-06-20 MED ORDER — ACETAMINOPHEN 325 MG PO TABS
650.0000 mg | ORAL_TABLET | Freq: Once | ORAL | Status: AC
  Administered 2023-06-20: 650 mg via ORAL
  Filled 2023-06-20: qty 2

## 2023-06-20 MED ORDER — HEPARIN SOD (PORK) LOCK FLUSH 100 UNIT/ML IV SOLN
500.0000 [IU] | Freq: Once | INTRAVENOUS | Status: AC | PRN
  Administered 2023-06-20: 500 [IU]

## 2023-06-20 NOTE — Telephone Encounter (Signed)
Oral Oncology Pharmacist Encounter  Received new prescription for Nerlynx (neratinib) for the extended adjuvant treatment of stage IIIA ER+/PR-/HER-2 positive breast cancer in conjunction with anastrozole, planned duration of Nerlynx is 1 year or until unacceptable drug toxicity.  CBC w/ Diff and CMP from 06/20/23 assessed, patient with AST of 53 U/L, but ALT and T.bili WNL. Patient with pltc of 109 K/uL. No baseline dose adjustments required for Nerlynx, patient will be dose-escalated to full dose to improve tolerability. Prescription dose and frequency assessed for appropriateness.  Will reach out to MD regarding Rx for budesonide for first 30 days of treatment as well as Imodium Rx for diarrhea PPX.   Current medication list in Epic reviewed, DDIs with Nerlynx identified: Category D drug-drug interaction between Nerlynx and Calcium Carbonate -  antacids can decrease serum concentrations and absorption of Nerlynx. Will counsel patient to separate antacids from Nerlynx, specifically taking Nerlynx at least 3 hours after antacid.   Evaluated chart and no patient barriers to medication adherence noted.   Patient agreement for treatment documented in MD note on 06/20/23.  Prescription has been e-scribed to the [facility] for benefits analysis and approval.  Oral Oncology Clinic will continue to follow for insurance authorization, copayment issues, initial counseling and start date.  Lenord Carbo, PharmD, BCPS, BCOP Hematology/Oncology Clinical Pharmacist [facility] Oral Chemotherapy Navigation Clinics [phone] 06/20/2023 11:02 AM

## 2023-06-20 NOTE — Telephone Encounter (Signed)
Oral Oncology Patient Advocate Encounter   Was successful in securing patient an $3,250 grant from [employer] [facility]) to provide copayment coverage for Nerlynx.  This will keep the out of pocket expense at $0.     I have spoken with the patient.    The billing information is as follows and has been shared with [facility].   Member ID: [id number] Group ID: [id number] RxBin: [id number] Dates of Eligibility: 06/20/23 through 06/17/24  Fund:  Breast  Jinger Neighbors, CPhT-Adv Oncology Pharmacy Patient Advocate [facility] Direct Number: [phone]  Fax: [phone]

## 2023-06-20 NOTE — Progress Notes (Signed)
[facility]   Telephone:([phone]) [phone] [phone]   Clinic Follow up Note   Patient Care Team: Gweneth Dimitri, MD as PCP - General (Family Medicine) Marykay Lex, MD as PCP - Cardiology (Cardiology) Pershing Proud, RN as Oncology Nurse Navigator Donnelly Angelica, RN as Oncology Nurse Navigator Emelia Loron, MD as Consulting Physician (General Surgery) Malachy Mood, MD as Consulting Physician (Hematology) Dorothy Puffer, MD as Consulting Physician (Radiation Oncology)  Date of Service:  06/20/2023  CHIEF COMPLAINT: f/u of  right breast cancer     CURRENT THERAPY:  Kadcyla, q21d, started 08/24/22, plan for a total of 14 cycles  Anastrozole started in 12/2022 [name] 160 mg starting 06/20/2023 ASSESSMENT:  Ashley Robertson is a 67 y.o. female with    Malignant neoplasm of overlapping sites of right breast in female, estrogen receptor positive (HCC) invasive lobular carcinoma, Stage IIIA, c(T3, N1), yp(T3, N1a), ER+/PR-/HER2+, Grade 2  -Diagnosed in 01/2022 -s/p neoadjuvant chemo TCHP X6, followed by right mastectomy by Dr. Dwain Sarna and reconstruction by Dr. Leta Baptist. Unfortunately she did not have much response to neoadjvuant chemo  -on adjuvant Kadcyla now, tolerating well with no noticeable side effects. Plan for a total of 14 treatment -she received postmastectomy radiation under Dr. Mitzi Hansen, 10/10 - 10/29/22. -she started adjuvant anastrozole in January 2024, she is tolerating very well -Echocardiogram in January 2024 showed slightly decreased EF 50 to 55%.  She was seen by cardiologist after echo, her cardiac medication was adjusted, and we will continue HER2 antibody.  repeated echo on 03/18/23 showed normal EF 55% -She is clinically doing very well, asymptomatic, lab reviewed, adequate for treatment, will proceed to last cycle Kadcyla today -Given her ER positive and node positive disease, I recommend adjuvant neratinib for 1 year.  Potential benefit and  side effects were discussed with her in detail, especially diarrhea and its management, fatigue, GI upset, abnormal liver function etc., patient agrees to proceed.  Plan to start in a months. -Follow-up 2 weeks after she starts neratinib                     Osteopenia -Her most recent DEXA was on 04/29/20 showing osteopenia (T-score of -2.1 at L1-3). -continue calcium and vitD  -We discussed the negative impact on bone density from aromatase inhibitor -I previously recommended her to consider Zometa infusion every 6 months for 2 years, benefit and side effect discussed with her. She will think about it    Pancytopenia -She has mild leukopenia, anemia, and thrombocytopenia, likely related to previous chemo and current Kadcyla -Will monitor closely, no need for blood transfusion at this point. -Overall mild and stable    PLAN: -lab reviewed -Discuss [name]  and its side effects, I will call in today, plan to start in 4 weeks. She wills start at 160mg  and increase 40mg  every week until 240mg  daily on third week then continue on 240mg  daily  -proceed with final treatment Kadacyla today. -lab/flush and f/u in 6 weeks  SUMMARY OF ONCOLOGIC HISTORY: Oncology History Overview Note   Cancer Staging  Malignant neoplasm of overlapping sites of right breast in female, estrogen receptor positive (HCC) Staging form: Breast, AJCC 8th Edition - Clinical stage from 02/09/2022: Stage IIA (cT2, cN1, cM0, G2, ER+, PR-, HER2+) - Signed by Malachy Mood, MD on 02/20/2022    Malignant neoplasm of overlapping sites of right breast in female, estrogen receptor positive (HCC)  02/08/2022 Mammogram   CLINICAL DATA:  Acute  onset right breast lump.  EXAM: DIGITAL DIAGNOSTIC UNILATERAL RIGHT MAMMOGRAM WITH TOMOSYNTHESIS AND CAD; ULTRASOUND RIGHT BREAST LIMITED  IMPRESSION: 1. Highly suspicious findings mammographically and sonographically. I suspect there is significant malignancy throughout most of the right  breast. Discrete masses are seen at 11 o'clock and 6 o'clock.  A single borderline lymph node is identified. This lymph node appears prominent compared to the remainder of the lymph nodes. The skin thickening suggests the possibility of inflammatory breast cancer.   02/09/2022 Cancer Staging   Staging form: Breast, AJCC 8th Edition - Clinical stage from 02/09/2022: Stage IIIA (cT3, cN1, cM0, G2, ER+, PR-, HER2+) - Signed by Malachy Mood, MD on 03/05/2022 Stage prefix: Initial diagnosis Histologic grading system: 3 grade system   02/09/2022 Initial Biopsy   Diagnosis 1. Breast, right, needle core biopsy, right breast 11 o'clock mass ribbon clip - INVASIVE MAMMARY CARCINOMA - SEE COMMENT 2. Breast, right, needle core biopsy, right breast 6'oclock mass, coil clip - INVASIVE MAMMARY CARCINOMA - SEE COMMENT 3. Lymph node, needle/core biopsy, right axillary lymph node, tribell clip - METASTATIC CARCINOMA INVOLVING NODAL TISSUE Microscopic Comment 1. and 2. The biopsy material shows an infiltrative proliferation of cells arranged linearly and in small clusters. Based on the biopsy, the carcinoma appears Nottingham grade 2 of 3 and measures 1.5 cm in greatest linear extent.  Addendum parts 1 and 2: Immunohistochemistry for E-cadherin is negative consistent with lobular carcinoma.  2. PROGNOSTIC INDICATORS Results: The tumor cells are EQUIVOCAL for Her2 (2+). Her2 by FISH will be performed and the results reported separately. Estrogen Receptor: 100%, POSITIVE, STRONG STAINING INTENSITY Progesterone Receptor: <1%, NEGATIVE Proliferation Marker Ki67: 10%  2. FLUORESCENCE IN-SITU HYBRIDIZATION Results: GROUP 1: HER2 **POSITIVE**   3. PROGNOSTIC INDICATORS Results: By immunohistochemistry, the tumor cells are POSITIVE for Her2 (3+). Estrogen Receptor: 100%, POSITIVE, STRONG STAINING INTENSITY Progesterone Receptor: <1%, NEGATIVE   02/16/2022 Initial Diagnosis   Malignant neoplasm of overlapping  sites of right breast in female, estrogen receptor positive (HCC)   03/01/2022 Imaging   EXAM: BILATERAL BREAST MRI WITH AND WITHOUT CONTRAST  IMPRESSION: 1. 8.5 x 8.3 x 5.9 cm area of confluent mass-like enhancement in the right breast involving all 4 quadrants and containing 2 biopsy marker clip artifacts, compatible with 4 quadrant biopsy-proven malignancy. 2. Biopsy-proven metastatic lymph node in the right axilla. 3. Possible metastatic intramammary lymph node in the posterior outer right breast just above the level of the nipple. 4. No evidence of malignancy on the left.   03/01/2022 Genetic Testing   Negative hereditary cancer genetic testing: no pathogenic variants detected in [employer] CustomNext-Cancer +RNAinsight Panel.  Variant of uncertain significance reported in BRIP1 at p.F934V (c.2800T>G). Report date is March 01, 2022.    The CustomNext-Cancer+RNAinsight panel offered by [employer] includes sequencing and rearrangement analysis for the following 47 genes:  APC, ATM, AXIN2, BARD1, BMPR1A, BRCA1, BRCA2, BRIP1, CDH1, CDK4, CDKN2A, CHEK2, DICER1, EPCAM, GREM1, HOXB13, MEN1, MLH1, MSH2, MSH3, MSH6, MUTYH, NBN, NF1, NF2, NTHL1, PALB2, PMS2, POLD1, POLE, PTEN, RAD51C, RAD51D, RECQL, RET, SDHA, SDHAF2, SDHB, SDHC, SDHD, SMAD4, SMARCA4, STK11, TP53, TSC1, TSC2, and VHL.  RNA data is routinely analyzed for use in variant interpretation for all genes.  UPDATE: BRIP1 p.F934V VUS has been amended to Likely Benign. Amended report date is 11/06/2022.    03/05/2022 Imaging   EXAM: CT CHEST, ABDOMEN, AND PELVIS WITH CONTRAST  IMPRESSION: 1. Asymmetric, masslike density of the glandular tissue of the right breast with overlying skin thickening, in  keeping with known primary breast malignancy. 2. Enlarged, ill-defined right axillary lymph node containing a biopsy marking clip, consistent with known nodal metastatic disease. 3. No other evidence of lymphadenopathy or metastatic disease in  the chest, abdomen, or pelvis. 4. Emphysema. Background of fine centrilobular nodularity, most concentrated in the lung apices, consistent with smoking-related respiratory bronchiolitis. 5. Aneurysm of the infrarenal abdominal aorta measuring up to 5.4 x 5.3 cm with a large burden of eccentric mural thrombus. Recommend follow-up CT/MR every 6 months and vascular consultation. This recommendation follows ACR consensus guidelines: White Paper of the ACR Incidental Findings Committee II on Vascular Findings. J Am Coll Radiol 2013; 10:789-794. 6. Coronary artery disease.   03/05/2022 Imaging   EXAM: NUCLEAR MEDICINE WHOLE BODY BONE SCAN  IMPRESSION: No evidence of bony metastatic disease.   03/07/2022 - 08/01/2022 Chemotherapy   Patient is on Treatment Plan : BREAST  Docetaxel + Carboplatin + Trastuzumab + Pertuzumab  (TCHP) q21d      07/26/2022 Surgery   Right mastectomy with axillary node seed guided excision converted to axillary node dissection with reconstruction by Drs. Dwain Sarna and Thimmappa   07/26/2022 Pathology Results   FINAL MICROSCOPIC DIAGNOSIS:   A. LYMPH NODE, RIGHT AXILLARY TARGETED, EXCISION:  - Metastatic carcinoma involving one lymph node, 2 cm (1/1).  - Focal extranodal extension.  - Biopsy site and biopsy clip.   B. BREAST, RIGHT, MASTECTOMY:  - Invasive lobular carcinoma, 9.5 cm (ypT3).  - Carcinoma involves dermis of nipple.  - All surgical margins negative for carcinoma.  - One lymph node negative for metastatic carcinoma (0/1).  - Biopsy sites and biopsy clips.  - See oncology table.   C. LYMPH NODE, RIGHT AXILLARY, SENTINEL, EXCISION:  - One lymph node negative for metastatic carcinoma (0/1).   D. LYMPH NODE, RIGHT AXILLARY, SENTINEL, EXCISION:  - Metastatic carcinoma in one lymph node, 0.5 cm. (1/1).   E. LYMPH NODE, RIGHT AXILLARY, SENTINEL, EXCISION:  - One lymph node negative for metastatic carcinoma (0/1).    07/26/2022 Cancer Staging   Cancer  Staging  Malignant neoplasm of overlapping sites of right breast in female, estrogen receptor positive (HCC) Staging form: Breast, AJCC 8th Edition - Clinical stage from 02/09/2022: Stage IIIA (cT3, cN1, cM0, G2, ER+, PR-, HER2+) - Signed by Malachy Mood, MD on 03/05/2022 Stage prefix: Initial diagnosis Histologic grading system: 3 grade system - Pathologic stage from 07/26/2022: Stage IIIA (pT3, pN1a, cM0, G2, ER+, PR-, HER2+) - Signed by Pollyann Samples, NP on 08/01/2022 Histologic grading system: 3 grade system    07/26/2022 Cancer Staging   Staging form: Breast, AJCC 8th Edition - Pathologic stage from 07/26/2022: Stage IIIA (pT3, pN1a, cM0, G2, ER+, PR-, HER2+) - Signed by Pollyann Samples, NP on 08/01/2022 Histologic grading system: 3 grade system   08/24/2022 -  Chemotherapy   Patient is on Treatment Plan : BREAST ADO-Trastuzumab Emtansine (Kadcyla) q21d        INTERVAL HISTORY:  Ashley Robertson is here for a follow up of  right breast cancer . She was last seen by me on 05/09/2023. She presents to the clinic alone. Pt state that she has no issues with the last treatment. She state that her neuropathy comes and goes, but is tolerable. She reports her appetite is good. Pt state that she has no issue with Anastrozole. Pt state that she has a trip to [place] at the September.  All other systems were reviewed with the patient and are negative.  MEDICAL HISTORY:  Past Medical History:  Diagnosis Date   Allergy    Lisinopril and lipitor   Anxiety and depression    Aortic atherosclerosis (HCC) 03/2020   CT Chest: 2 V (LAD & LCx) Coronary Atherosclerosis, Aortic Atherosclerosis (no aneurysm).  Mild centrilobular emphysema with mild diffuse bronchial thickening; several scattered small solitary pulmonary nodules (largest 5.6 mm in anterior left upper lobe)   Breast cancer (HCC) 12/2021   Right breast ILC   Carotid artery plaque, bilateral 10/2015   Mild to moderate plaque L>R without significant  stenosis   COPD (chronic obstructive pulmonary disease) (HCC)    Coronary Artery Calcification - Score 79    Coronary Calcium Score 79.  LAD and circumflex calcification noted.  Normal ascending aorta with mild calcification.   Current every day smoker    pt quit smoking April 2023   Emphysema lung [facility])    Noted on chest CT   Family history of breast cancer 02/21/2022   Family history of prostate cancer 02/21/2022   Hyperlipidemia    Hypertension    Controlled with amlodipine   Prediabetes    Skin cancer 2021   Remove 2022    SURGICAL HISTORY: Past Surgical History:  Procedure Laterality Date   ABDOMINAL AORTIC ANEURYSM REPAIR N/A 01/11/2023   Procedure: ANEURYSM ABDOMINAL AORTIC REPAIR;  Surgeon: Nada Libman, MD;  Location: [facility] OR;  Service: Vascular;  Laterality: N/A;   BREAST BIOPSY Right    times 3   BREAST RECONSTRUCTION WITH PLACEMENT OF TISSUE EXPANDER AND ALLODERM Right 07/26/2022   Procedure: RIGHT BREAST RECONSTRUCTION WITH PLACEMENT OF TISSUE EXPANDER AND ALLODERM;  Surgeon: Glenna Fellows, MD;  Location: [facility] OR;  Service: Plastics;  Laterality: Right;   CESAREAN SECTION  1990, 1991   COSMETIC SURGERY  1981   Rhinoplasty   MASTECTOMY Right    MASTECTOMY W/ SENTINEL NODE BIOPSY Right 07/26/2022   Procedure: RIGHT MASTECTOMY, RIGHT AXILLARY SENTINEL NODE BIOPSY;  Surgeon: Emelia Loron, MD;  Location: [facility] OR;  Service: General;  Laterality: Right;  GEN & PEC BLOCK   PORTACATH PLACEMENT Left 03/06/2022   Procedure: INSERTION PORT-A-CATH;  Surgeon: Emelia Loron, MD;  Location: [facility];  Service: General;  Laterality: Left;   RADIOACTIVE SEED GUIDED AXILLARY SENTINEL LYMPH NODE Right 07/26/2022   Procedure: RADIOACTIVE SEED GUIDED AXILLARY SENTINEL LYMPH NODE DISSECTION;  Surgeon: Emelia Loron, MD;  Location: [facility] OR;  Service: General;  Laterality: Right;   RHINOPLASTY  1981   TONSILLECTOMY  1978   TRANSTHORACIC ECHOCARDIOGRAM   10/2016   EF 55 to 60%.  Normal systolic and diastolic function.  No ASD/PFO   TUBAL LIGATION  1991    I have reviewed the social history and family history with the patient and they are unchanged from previous note.  ALLERGIES:  is allergic to lisinopril and lipitor [atorvastatin].  MEDICATIONS:  Current Outpatient Medications  Medication Sig Dispense Refill   Budesonide ER 9 MG CP24 Take 1 capsule (9 mg) by mouth daily. Start when you start [name], for one to two months 30 capsule 1   [name] Maleate (NERLYNX) 40 MG tablet Take 4 tablets (160 mg total) by mouth daily. Take with food. If tolerates well, increase to 5 tabs daily on second week and 6 tabs daily on third week and forward 150 tablet 0   albuterol (PROVENTIL HFA) 108 (90 Base) MCG/ACT inhaler Inhale 1 to 2 puffs into the lungs every 6 hours as needed 8.5 g 2  alendronate (FOSAMAX) 70 MG tablet Take 1 tablet by mouth once weekly --Take 30 minutes before the first food, beverage or medicine of the day with plain water 12 tablet 3   amLODipine (NORVASC) 10 MG tablet Take 1 tablet (10 mg total) by mouth at bedtime. 90 tablet 3   anastrozole (ARIMIDEX) 1 MG tablet Take 1 tablet (1 mg) by mouth daily. 90 tablet 3   aspirin 81 MG tablet Take 81 mg by mouth at bedtime.     Calcium Carb-Cholecalciferol (CALCIUM 500 + D3 PO) Take 1 tablet by mouth daily.     carvedilol (COREG) 6.25 MG tablet Take 1 tablet (6.25 mg total) by mouth 2 (two) times daily. 60 tablet 4   Cholecalciferol (VITAMIN D) 50 MCG (2000 UT) CAPS Take 1 capsule by mouth daily. 30 capsule 2   ezetimibe (ZETIA) 10 MG tablet Take 1 tablet (10 mg total) by mouth daily. 90 tablet 3   FLUoxetine (PROZAC) 20 MG capsule Take 1 capsule (20 mg total) by mouth daily. 90 capsule 3   Fluticasone-Umeclidin-Vilant (TRELEGY ELLIPTA) 200-62.5-25 MCG/ACT AEPB Inhale 1 puff into the lungs daily. 60 each 2   ibuprofen (ADVIL) 200 MG tablet Take 600 mg by mouth every 6 (six) hours as  needed for moderate pain.     OVER THE COUNTER MEDICATION Take 1 capsule by mouth daily. Serrapeptase 120,000 units daily  Reported on 12/27/2015     rosuvastatin (CRESTOR) 5 MG tablet Take 1 tablet by mouth daily at bedtime ,except on Monday, Wednesday and Fridays take 2 tablets at bedtime as directed 180 tablet 3   Specialty Vitamins Products (MAGNESIUM, AMINO ACID CHELATE,) 133 MG tablet Take 1 tablet by mouth at bedtime.     No current facility-administered medications for this visit.    PHYSICAL EXAMINATION: ECOG PERFORMANCE STATUS: 0 - Asymptomatic  Vitals:   06/20/23 0909  BP: 130/83  Pulse: (!) 51  Resp: 18  Temp: 98.3 F (36.8 C)  SpO2: 95%   Wt Readings from Last 3 Encounters:  06/20/23 143 lb 14.4 oz (65.3 kg)  05/30/23 145 lb 8 oz (66 kg)  05/09/23 148 lb 1.6 oz (67.2 kg)     GENERAL:alert, no distress and comfortable SKIN: skin color normal, no rashes or significant lesions EYES: normal, Conjunctiva are pink and non-injected, sclera clear  NEURO: alert & oriented x 3 with fluent speech  LABORATORY DATA:  I have reviewed the data as listed    Latest Ref Rng & Units 06/20/2023    8:51 AM 05/30/2023    7:36 AM 05/09/2023    8:02 AM  CBC  WBC 4.0 - 10.5 K/uL 4.8  3.7  3.3   Hemoglobin 12.0 - 15.0 g/dL 65.7  84.6  96.2   Hematocrit 36.0 - 46.0 % 36.0  35.0  35.5   Platelets 150 - 400 K/uL 109  92  85         Latest Ref Rng & Units 06/20/2023    8:51 AM 05/30/2023    7:36 AM 05/09/2023    8:02 AM  CMP  Glucose 70 - 99 mg/dL 952  841  324   BUN 8 - 23 mg/dL 14  11  13    Creatinine 0.44 - 1.00 mg/dL 4.01  0.27  2.53   Sodium 135 - 145 mmol/L 136  139  139   Potassium 3.5 - 5.1 mmol/L 3.9  4.0  4.0   Chloride 98 - 111 mmol/L 101  105  105  CO2 22 - 32 mmol/L 29  28  28    Calcium 8.9 - 10.3 mg/dL 9.5  9.2  9.5   Total Protein 6.5 - 8.1 g/dL 7.5  7.4  7.4   Total Bilirubin 0.3 - 1.2 mg/dL 0.7  0.6  0.6   Alkaline Phos 38 - 126 U/L 207  193  219   AST 15 - 41  U/L 53  45  42   ALT 0 - 44 U/L 29  26  24        RADIOGRAPHIC STUDIES: I have personally reviewed the radiological images as listed and agreed with the findings in the report. No results found.    No orders of the defined types were placed in this encounter.  All questions were answered. The patient knows to call the clinic with any problems, questions or concerns. No barriers to learning was detected. The total time spent in the appointment was 30 minutes.     Malachy Mood, MD 06/20/2023   Carolin Coy, CMA, am acting as scribe for Malachy Mood, MD.   I have reviewed the above documentation for accuracy and completeness, and I agree with the above.

## 2023-06-20 NOTE — Telephone Encounter (Signed)
Oral Oncology Patient Advocate Encounter   Received notification that prior authorization for Nerlynx is required.   PA submitted on 06/20/23 Key [id number] Status is pending     Jinger Neighbors, CPhT-Adv Oncology Pharmacy Patient Advocate [facility] Direct Number: [phone]  Fax: [phone]

## 2023-06-20 NOTE — Patient Instructions (Addendum)
[place] [facility] AT [facility]  Discharge Instructions: Thank you for choosing [facility] to provide your oncology and hematology care.   If you have a lab appointment with the Cancer Center, please go directly to the Cancer Center and check in at the registration area.   Wear comfortable clothing and clothing appropriate for easy access to any Portacath or PICC line.   We strive to give you quality time with your provider. You may need to reschedule your appointment if you arrive late (15 or more minutes).  Arriving late affects you and other patients whose appointments are after yours.  Also, if you miss three or more appointments without notifying the office, you may be dismissed from the clinic at the provider's discretion.      For prescription refill requests, have your pharmacy contact our office and allow 72 hours for refills to be completed.    Today you received the following chemotherapy and/or immunotherapy agents Kadcyla      To help prevent nausea and vomiting after your treatment, we encourage you to take your nausea medication as directed.  BELOW ARE SYMPTOMS THAT SHOULD BE REPORTED IMMEDIATELY: *FEVER GREATER THAN 100.4 F (38 C) OR HIGHER *CHILLS OR SWEATING *NAUSEA AND VOMITING THAT IS NOT CONTROLLED WITH YOUR NAUSEA MEDICATION *UNUSUAL SHORTNESS OF BREATH *UNUSUAL BRUISING OR BLEEDING *URINARY PROBLEMS (pain or burning when urinating, or frequent urination) *BOWEL PROBLEMS (unusual diarrhea, constipation, pain near the anus) TENDERNESS IN MOUTH AND THROAT WITH OR WITHOUT PRESENCE OF ULCERS (sore throat, sores in mouth, or a toothache) UNUSUAL RASH, SWELLING OR PAIN  UNUSUAL VAGINAL DISCHARGE OR ITCHING   Items with * indicate a potential emergency and should be followed up as soon as possible or go to the Emergency Department if any problems should occur.  Please show the CHEMOTHERAPY ALERT CARD or IMMUNOTHERAPY ALERT CARD at check-in  to the Emergency Department and triage nurse.  Should you have questions after your visit or need to cancel or reschedule your appointment, please contact [place] [facility] AT [facility]  Dept: [phone]  and follow the prompts.  Office hours are 8:00 a.m. to 4:30 p.m. Monday - Friday. Please note that voicemails left after 4:00 p.m. may not be returned until the following business day.  We are closed weekends and major holidays. You have access to a nurse at all times for urgent questions. Please call the main number to the clinic Dept: [phone] and follow the prompts.   For any non-urgent questions, you may also contact your provider using MyChart. We now offer e-Visits for anyone 70 and older to request care online for non-urgent symptoms. For details visit [email or web address].   Also download the MyChart app! Go to the app store, search "MyChart", open the app, select [place], and log in with your MyChart username and password.  Masks are optional in the cancer centers. If you would like for your care team to wear a mask while they are taking care of you, please let them know. You may have one support person who is at least 68 years old accompany you for your appointments. [name] Tablets What is this medication? [name] (ner A ti nib) treats breast cancer. It works by blocking a protein that causes cancer cells to grow and multiply. This helps to slow or stop the spread of cancer cells. This medicine may be used for other purposes; ask your health care provider or pharmacist if you have questions.  COMMON BRAND NAME(S): Nerlynx What should I tell my care team before I take this medication? They need to know if you have any of these conditions: Dehydration Liver disease An unusual or allergic reaction to neratinib, other medications, foods, dyes, or preservatives Pregnant or trying to get pregnant Breast-feeding How should I use this medication? Take this  medication by mouth with water. Take it as directed on the prescription label at the same time every day. Do not cut, crush, or chew this medication. Swallow the tablets whole. Take it with food. Keep taking it unless your care team tells you to stop. Do not take this medication with grapefruit juice. Take products for stomach acid at a different time of day than this medication. Take this medication at least 3 hours after antacids. Take this medication 2 hours before or 10 hours after H2 blockers, such as cimetidine or famotidine. A special MedGuide will be given to you by the pharmacist with each prescription and refill. Be sure to read this information carefully each time. Talk to your care team about the use of this medication in children. Special care may be needed. Overdosage: If you think you have taken too much of this medicine contact a poison control center or emergency room at once. NOTE: This medicine is only for you. Do not share this medicine with others. What if I miss a dose? If you miss a dose, skip it. Take your next dose at the normal time. Do not take extra or 2 doses at the same time to make up for the missed dose. What may interact with this medication? Aprepitant Bosentan Calcium channel blockers, such as diltiazem or verapamil Certain antibiotics, such as clarithromycin, erythromycin, troleandomycin, ciprofloxacin Certain antivirals for HIV or AIDS Certain medications for fungal infections, such as clotrimazole, fluconazole, ketoconazole, itraconazole, posaconazole, voriconazole Certain medications for seizures, such as carbamazepine, phenobarbital, phenytoin Conivaptan Crizotinib Cyclosporine Dabigatran Digoxin Dronedarone Enzalutamide Fexofenadine Fluvoxamine Grapefruit juice Idelalisib Imatinib Mitotane Modafinil Nefazodone Rifampin St. John's wort Stomach acid blockers, such as cimetidine, famotidine, lansoprazole, ranitidine, omeprazole Tofisopam This  list may not describe all possible interactions. Give your health care provider a list of all the medicines, herbs, non-prescription drugs, or dietary supplements you use. Also tell them if you smoke, drink alcohol, or use illegal drugs. Some items may interact with your medicine. What should I watch for while using this medication? Visit your care team for regular checks on your progress. Tell your care team if your symptoms do not start to get better or if they get worse. You may need blood work while taking this medication. Talk to your care team if you wish to become pregnant or think you might be pregnant. This medication can cause serious birth defects if taken during pregnancy or for 1 month after the last dose. A negative pregnancy test is required before starting this medication. A reliable form of contraception is recommended while taking this medication and for 1 month after the last dose. Talk to your care team about reliable forms of contraception. Do not father a child while taking this medication or for 3 months after the last dose. Use a condom while having sex during this time period. Do not breast-feed while taking this medication and for 1 month after the last dose. Check with your care team if you have severe diarrhea, nausea, and vomiting, or if you sweat a lot. The loss of too much body fluid may make it dangerous for you to take this medication.  What side effects may I notice from receiving this medication? Side effects that you should report to your care team as soon as possible: Allergic reactions--skin rash, itching, hives, swelling of the face, lips, tongue, or throat Liver injury--right upper belly pain, loss of appetite, nausea, light-colored stool, dark yellow or brown urine, yellowing skin or eyes, unusual weakness or fatigue Severe or prolonged diarrhea Side effects that usually do not require medical attention (report these to your care team if they continue or are  bothersome): Change in nail shape, thickness, or color Fatigue Loss of appetite with weight loss Muscle spasms Nausea Pain, redness, or swelling with sores inside the mouth or throat Stomach pain This list may not describe all possible side effects. Call your doctor for medical advice about side effects. You may report side effects to FDA at 1-800-FDA-1088. Where should I keep my medication? Keep out of the reach of children and pets. Store at room temperature between 20 and 25 degrees C (68 and 77 degrees F). Get rid of any unused medication after the expiration date. To get rid of medications that are no longer needed or have expired: Take the medication to a medication take-back program. Check with your pharmacy or law enforcement to find a location. If you cannot return the medication, check the label or package insert to see if the medication should be thrown out in the garbage or flushed down the toilet. If you are not sure, ask your care team. If it is safe to put it in the trash, take the medication out of the container. Mix the medication with cat litter, dirt, coffee grounds, or other unwanted substance. Seal the mixture in a bag or container. Put it in the trash. NOTE: This sheet is a summary. It may not cover all possible information. If you have questions about this medicine, talk to your doctor, pharmacist, or health care provider.  2024 Elsevier/Gold Standard (2022-03-13 00:00:00)

## 2023-06-21 ENCOUNTER — Telehealth: Payer: Self-pay | Admitting: Hematology

## 2023-06-24 ENCOUNTER — Other Ambulatory Visit (HOSPITAL_COMMUNITY): Payer: Self-pay

## 2023-06-24 NOTE — Telephone Encounter (Signed)
Oral Oncology Patient Advocate Encounter  Prior Authorization for Nerlynx has been approved.    PA# [id number] Effective dates: 06/20/23 through 06/19/24  Patients co-pay is $1,834.72.    Jinger Neighbors, CPhT-Adv Oncology Pharmacy Patient Advocate [facility] Direct Number: [phone]  Fax: [phone]

## 2023-06-26 ENCOUNTER — Encounter: Payer: Self-pay | Admitting: *Deleted

## 2023-06-26 DIAGNOSIS — Z170 Estrogen receptor positive status [ER+]: Secondary | ICD-10-CM

## 2023-06-30 ENCOUNTER — Other Ambulatory Visit: Payer: Self-pay | Admitting: Pulmonary Disease

## 2023-07-01 ENCOUNTER — Other Ambulatory Visit (HOSPITAL_COMMUNITY): Payer: Self-pay

## 2023-07-01 MED ORDER — TRELEGY ELLIPTA 200-62.5-25 MCG/ACT IN AEPB
1.0000 | INHALATION_SPRAY | Freq: Every day | RESPIRATORY_TRACT | 2 refills | Status: DC
  Filled 2023-07-01: qty 60, 30d supply, fill #0
  Filled 2023-07-29: qty 60, 30d supply, fill #1
  Filled 2023-09-03: qty 60, 30d supply, fill #2

## 2023-07-03 ENCOUNTER — Ambulatory Visit: Payer: PPO | Attending: Internal Medicine | Admitting: Internal Medicine

## 2023-07-03 ENCOUNTER — Encounter: Payer: Self-pay | Admitting: Internal Medicine

## 2023-07-03 VITALS — BP 116/66 | HR 51 | Ht 69.0 in | Wt 143.0 lb

## 2023-07-03 DIAGNOSIS — I2584 Coronary atherosclerosis due to calcified coronary lesion: Secondary | ICD-10-CM

## 2023-07-03 DIAGNOSIS — Z79899 Other long term (current) drug therapy: Secondary | ICD-10-CM

## 2023-07-03 DIAGNOSIS — I2510 Atherosclerotic heart disease of native coronary artery without angina pectoris: Secondary | ICD-10-CM | POA: Diagnosis not present

## 2023-07-03 DIAGNOSIS — Z01810 Encounter for preprocedural cardiovascular examination: Secondary | ICD-10-CM

## 2023-07-03 DIAGNOSIS — Z5181 Encounter for therapeutic drug level monitoring: Secondary | ICD-10-CM | POA: Diagnosis not present

## 2023-07-03 DIAGNOSIS — I10 Essential (primary) hypertension: Secondary | ICD-10-CM

## 2023-07-03 NOTE — Patient Instructions (Signed)
   Follow-Up: At [facility], you and your health needs are our priority.  As part of our continuing mission to provide you with exceptional heart care, we have created designated Provider Care Teams.  These Care Teams include your primary Cardiologist (physician) and Advanced Practice Providers (APPs -  Physician Assistants and Nurse Practitioners) who all work together to provide you with the care you need, when you need it.  We recommend signing up for the patient portal called "MyChart".  Sign up information is provided on this After Visit Summary.  MyChart is used to connect with patients for Virtual Visits (Telemedicine).  Patients are able to view lab/test results, encounter notes, upcoming appointments, etc.  Non-urgent messages can be sent to your provider as well.   To learn more about what you can do with MyChart, go to [email or web address].    Your next appointment:   6 month(s)  Provider:   Carolan Clines MD

## 2023-07-03 NOTE — Progress Notes (Signed)
Cardiology Office Note:    Date:  07/03/2023   ID:  Ashley Robertson, DOB 1956/02/26, MRN [id number]  PCP:  Gweneth Dimitri, MD   [facility] Providers Cardiologist:  Bryan Lemma, MD     Referring MD: Gweneth Dimitri, MD   No chief complaint on file. Cardio-Oncology Clinic- on cardiotoxic cancer therapy  History of Present Illness:    Ashley Robertson is a 67 y.o. female with a hx of HER2+, ER+ R breast cancer diagnosed 01/2022, on Her2 therapy since 03/2022 s/p mastemctomy, XRT 09/11/2022-10/24/2022, HTN, referral to cardio-onc clinic from Dr. Mosetta Putt. She is doing well on her2. Her blood pressure has been elevated. Her last echo read as EF decline 50-55, looks closer to 55-60.  Her GLS was normal. I recommended started carvedilol for cardioprotection. Recommended to continue her2 therapy.   She was following Dr. Herbie Baltimore for elevated CAC < 100. LDL goal <70. She saw Juanda Crumble. Dicussed infrarenal AA 5.4 x5.3 cm with a large burden of eccentric mural thrombus in April 2023, repeat shows infrarenal AA up to 5.7 cm, she is followed by vascular sx, she is planned for open repair.  No cardiac disease hx. She denies chest pain or SOB. No orthopnea/PND. No LE edema  She has grandkids, she is active with them.   Father had hypertension. Mother is healthy. Sister with hypertension. Brother had an MI while undergoing surgery for UC  The 10-year ASCVD risk score (Arnett DK, et al., 2019) is: 6.3%   Values used to calculate the score:     Age: 46 years     Sex: Female     Is Non-Hispanic African American: No     Diabetic: No     Tobacco smoker: No     Systolic Blood Pressure: 116 mmHg     Is BP treated: Yes     HDL Cholesterol: 87 mg/dL     Total Cholesterol: 181 mg/dL   EKG 06/10/4695- NSR  Interim hx 02/13/2023 She underwent AAA repair. She developed postop afib. She was started on eliquis. She converted to sinus with amiodarone. She was seen by Dr. Flora Lipps  She denies  angina, dyspnea on exertion, lower extremity edema, PND or orthopnea. No LH  Interim hx 07/03/2023 She is taking care of her grand childiren. No palpitations. No chest pain or SOB. Her therapy has gone well.    Past Medical History:  Diagnosis Date   Allergy    Lisinopril and lipitor   Anxiety and depression    Aortic atherosclerosis (HCC) 03/2020   CT Chest: 2 V (LAD & LCx) Coronary Atherosclerosis, Aortic Atherosclerosis (no aneurysm).  Mild centrilobular emphysema with mild diffuse bronchial thickening; several scattered small solitary pulmonary nodules (largest 5.6 mm in anterior left upper lobe)   Breast cancer (HCC) 12/2021   Right breast ILC   Carotid artery plaque, bilateral 10/2015   Mild to moderate plaque L>R without significant stenosis   COPD (chronic obstructive pulmonary disease) (HCC)    Coronary Artery Calcification - Score 79    Coronary Calcium Score 79.  LAD and circumflex calcification noted.  Normal ascending aorta with mild calcification.   Current every day smoker    pt quit smoking April 2023   Emphysema lung [facility] [place])    Noted on chest CT   Family history of breast cancer 02/21/2022   Family history of prostate cancer 02/21/2022   Hyperlipidemia    Hypertension    Controlled with amlodipine   Prediabetes  Skin cancer 2021   Remove 2022    Past Surgical History:  Procedure Laterality Date   ABDOMINAL AORTIC ANEURYSM REPAIR N/A 01/11/2023   Procedure: ANEURYSM ABDOMINAL AORTIC REPAIR;  Surgeon: Nada Libman, MD;  Location: [facility] OR;  Service: Vascular;  Laterality: N/A;   BREAST BIOPSY Right    times 3   BREAST RECONSTRUCTION WITH PLACEMENT OF TISSUE EXPANDER AND ALLODERM Right 07/26/2022   Procedure: RIGHT BREAST RECONSTRUCTION WITH PLACEMENT OF TISSUE EXPANDER AND ALLODERM;  Surgeon: Glenna Fellows, MD;  Location: [facility] OR;  Service: Plastics;  Laterality: Right;   CESAREAN SECTION  1990, 1991   COSMETIC SURGERY  1981   Rhinoplasty   MASTECTOMY Right     MASTECTOMY W/ SENTINEL NODE BIOPSY Right 07/26/2022   Procedure: RIGHT MASTECTOMY, RIGHT AXILLARY SENTINEL NODE BIOPSY;  Surgeon: Emelia Loron, MD;  Location: [facility] OR;  Service: General;  Laterality: Right;  GEN & PEC BLOCK   PORTACATH PLACEMENT Left 03/06/2022   Procedure: INSERTION PORT-A-CATH;  Surgeon: Emelia Loron, MD;  Location: [facility];  Service: General;  Laterality: Left;   RADIOACTIVE SEED GUIDED AXILLARY SENTINEL LYMPH NODE Right 07/26/2022   Procedure: RADIOACTIVE SEED GUIDED AXILLARY SENTINEL LYMPH NODE DISSECTION;  Surgeon: Emelia Loron, MD;  Location: [facility] OR;  Service: General;  Laterality: Right;   RHINOPLASTY  1981   TONSILLECTOMY  1978   TRANSTHORACIC ECHOCARDIOGRAM  10/2016   EF 55 to 60%.  Normal systolic and diastolic function.  No ASD/PFO   TUBAL LIGATION  1991    Current Medications: Current Outpatient Medications on File Prior to Visit  Medication Sig Dispense Refill   albuterol (PROVENTIL HFA) 108 (90 Base) MCG/ACT inhaler Inhale 1 to 2 puffs into the lungs every 6 hours as needed 8.5 g 2   alendronate (FOSAMAX) 70 MG tablet Take 1 tablet by mouth once weekly --Take 30 minutes before the first food, beverage or medicine of the day with plain water 12 tablet 3   amLODipine (NORVASC) 10 MG tablet Take 1 tablet (10 mg total) by mouth at bedtime. 90 tablet 3   anastrozole (ARIMIDEX) 1 MG tablet Take 1 tablet (1 mg) by mouth daily. 90 tablet 3   aspirin 81 MG tablet Take 81 mg by mouth at bedtime.     Budesonide ER 9 MG CP24 Take 1 capsule (9 mg) by mouth daily. Start when you start [name], for one to two months 30 capsule 1   Calcium Carb-Cholecalciferol (CALCIUM 500 + D3 PO) Take 1 tablet by mouth daily.     carvedilol (COREG) 6.25 MG tablet Take 1 tablet (6.25 mg total) by mouth 2 (two) times daily. 60 tablet 4   Cholecalciferol (VITAMIN D) 50 MCG (2000 UT) CAPS Take 1 capsule by mouth daily. 30 capsule 2   ezetimibe (ZETIA) 10  MG tablet Take 1 tablet (10 mg total) by mouth daily. 90 tablet 3   FLUoxetine (PROZAC) 20 MG capsule Take 1 capsule (20 mg total) by mouth daily. 90 capsule 3   Fluticasone-Umeclidin-Vilant (TRELEGY ELLIPTA) 200-62.5-25 MCG/ACT AEPB Inhale 1 puff into the lungs daily. 60 each 2   [name] Maleate (NERLYNX) 40 MG tablet Take 4 tablets (160 mg total) by mouth daily. Take with food. If tolerates well, increase to 5 tabs daily on second week and 6 tabs daily on third week and forward 150 tablet 0   OVER THE COUNTER MEDICATION Take 1 capsule by mouth daily. Serrapeptase 120,000 units daily  Reported on 12/27/2015  rosuvastatin (CRESTOR) 5 MG tablet Take 1 tablet by mouth daily at bedtime ,except on Monday, Wednesday and Fridays take 2 tablets at bedtime as directed 180 tablet 3   Specialty Vitamins Products (MAGNESIUM, AMINO ACID CHELATE,) 133 MG tablet Take 1 tablet by mouth at bedtime.     ibuprofen (ADVIL) 200 MG tablet Take 600 mg by mouth every 6 (six) hours as needed for moderate pain.     [DISCONTINUED] hydrochlorothiazide (HYDRODIURIL) 25 MG tablet Take 1 tablet (25 mg total) by mouth daily. 30 tablet 6   [DISCONTINUED] lisinopril (ZESTRIL) 5 MG tablet Take 1 tablet (5 mg total) by mouth daily. 30 tablet 1   No current facility-administered medications on file prior to visit.    Allergies:   Lisinopril and Lipitor [atorvastatin]   Social History   Socioeconomic History   Marital status: Married    Spouse name: Not on file   Number of children: 2   Years of education: Not on file   Highest education level: Bachelor's degree (e.g., BA, AB, BS)  Occupational History   Occupation: OR Academic librarian: [employer]    Comment: Retired  Tobacco Use   Smoking status: Former    Current packs/day: 0.00    Average packs/day: 1 pack/day for 40.0 years (40.0 ttl pk-yrs)    Types: Cigarettes    Start date: 04/01/1982    Quit date: 04/01/2022    Years since quitting: 1.2   Smokeless  tobacco: Never  Vaping Use   Vaping status: Former   Devices: zero nicotine vape  Substance and Sexual Activity   Alcohol use: Yes    Alcohol/week: 6.0 standard drinks of alcohol    Types: 6 Glasses of wine per week    Comment: social 4-6 glasses wine weekly per pt   Drug use: No   Sexual activity: Not Currently    Birth control/protection: Post-menopausal, Surgical    Comment: BTL  Other Topics Concern   Not on file  Social History Narrative   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  She used to work at [employer].     Has 2 daughters.   Quit smoking 04/01/22.   Drinks 4 to 6 cups of coffee a day.      Currently trying Intermittent Fasting diet for weight loss.  Does not routinely exercise.   Social Determinants of Health   Financial Resource Strain: Low Risk  (02/21/2022)   Overall Financial Resource Strain (CARDIA)    Difficulty of Paying Living Expenses: Not hard at all  Food Insecurity: No Food Insecurity (01/12/2023)   Hunger Vital Sign    Worried About Running Out of Food in the Last Year: Never true    Ran Out of Food in the Last Year: Never true  Transportation Needs: No Transportation Needs (01/12/2023)   PRAPARE - Administrator, Civil Service (Medical): No    Lack of Transportation (Non-Medical): No  Physical Activity: Not on file  Stress: Not on file  Social Connections: Not on file     Family History: The patient's family history includes Breast cancer in her niece and paternal aunt; Cancer - Other in her father; Healthy in her brother, brother, and sister; Hyperlipidemia in her father; Hypertension in her father and sister; Liver cancer in her maternal grandfather; Lung cancer in her brother and maternal grandmother; Parkinson's disease in her father; Prostate cancer in her  father; Skin cancer in her maternal aunt; Transient ischemic attack (age of onset: 84) in her mother.  ROS:    Please see the history of present illness.     All other systems reviewed and are negative.  EKGs/Labs/Other Studies Reviewed:    The following studies were reviewed today:  TTE: 10/29/2016- EF 55-60%, no valve disease 03/05/2022-EF 55-60%, GLS attempted 07/04/2022- EF nl, GLS -14.7% 09/19/2022- EF nl 12/19/2022-  EF estimated 50-55, challenging windows, looks closer to 55-60. GLS -19% normal  03/24/2020- coronary CTA- CAC 79 , 80th percentile, LAD and Lcx CAC  EKG:     Recent Labs: 01/02/2023: BNP 61.7 01/14/2023: Magnesium 1.8 06/20/2023: ALT 29; BUN 14; Creatinine 0.73; Hemoglobin 11.7; Platelet Count 109; Potassium 3.9; Sodium 136   Recent Lipid Panel    Component Value Date/Time   CHOL 181 01/02/2023 0830   TRIG 85 01/02/2023 0830   HDL 87 01/02/2023 0830   CHOLHDL 2.1 01/02/2023 0830   LDLCALC 79 01/02/2023 0830     Risk Assessment/Calculations:    CHA2DS2-VASc Score = 3   This indicates a 3.2% annual risk of stroke. The patient's score is based upon: CHF History: 0 HTN History: 1 Diabetes History: 0 Stroke History: 0 Vascular Disease History: 0 Age Score: 1 Gender Score: 1      Physical Exam:    VS:  Vitals:   07/03/23 0755  BP: 116/66  Pulse: (!) 51  SpO2: 95%     Wt Readings from Last 3 Encounters:  07/03/23 143 lb (64.9 kg)  06/20/23 143 lb 14.4 oz (65.3 kg)  05/30/23 145 lb 8 oz (66 kg)     GEN:  Well nourished, well developed in no acute distress HEENT: Normal NECK: No JVD; No carotid bruits LYMPHATICS: No lymphadenopathy CARDIAC: RRR, no murmurs, rubs, gallops RESPIRATORY:  Clear to auscultation without rales, wheezing or rhonchi  ABDOMEN: Soft, non-tender, non-distended. Well healed incision MUSCULOSKELETAL:  No edema; No deformity  SKIN: Warm and dry NEUROLOGIC:  Alert and oriented x 3 PSYCHIATRIC:  Normal affect   ASSESSMENT:   Cardio Onc: on her 2 since 03/2022-planned until September, 2024 (switched therapy in September 2023;  was on pertuzumab and trastuzumab without great response to therapy; changed to Kadcyla in September). Started cardioprotective agent for HTN, coreg in January 2024. GLS is normal. EF is preserved.  - She can continue her2 therapy.  - Baseline BNP was 61 - next echo planned in August - she is planned for neratinib (HER2/TKI) - if thrombocytopenia progresses to < 50 would avoid AC/antiplatelet  She is acceptable cardiac risk for breast reconstruction  Post-Afib: discussed risk/benefits of stopping AC. We decided that she can stop eliquis, but she can let us know if she develops symptoms of afib - continue asa 81 mg daily - s/p eliquis with chemical conversion - s/p amiodarone  Elevated CAC:  - continue asa 81 mg daily -continue crestor 5 mg (does 10 mg some days).  - cont zetia 10 mg daily - LDL near goal, she would like to avoid increased statin or PCSK9   HTN: well controlled. Continue coreg 6.25 mg BID and norvasc 10 mg daily. BP goal <130/80 mmHg  Infrarenal AA: up to 5.7 cm 12/21/2022; followed by vascular sx. She is s/p open repair 01/11/2023.  She is now s/p open AAA repair 2/2 juctarenal 5.7cm AAA on 2.9.2024.  Smoking: stopped smoking breast cancer diagnosis  PLAN:    In order of problems listed above:   TTE  limited GLS planned for August 21st  2.  Follow up 6 months      Medication Adjustments/Labs and Tests Ordered: Current medicines are reviewed at length with the patient today.  Concerns regarding medicines are outlined above.  No orders of the defined types were placed in this encounter.  No orders of the defined types were placed in this encounter.   There are no Patient Instructions on file for this visit.   Signed, Maisie Fus, MD  07/03/2023 8:08 AM    [facility]

## 2023-07-09 ENCOUNTER — Other Ambulatory Visit: Payer: Self-pay

## 2023-07-09 ENCOUNTER — Encounter: Payer: Self-pay | Admitting: Hematology

## 2023-07-09 ENCOUNTER — Other Ambulatory Visit (HOSPITAL_COMMUNITY): Payer: Self-pay

## 2023-07-09 MED ORDER — BUDESONIDE ER 9 MG PO TB24
9.0000 mg | ORAL_TABLET | Freq: Every day | ORAL | 1 refills | Status: DC
Start: 2023-07-09 — End: 2023-12-13
  Filled 2023-07-09 (×2): qty 30, 30d supply, fill #0
  Filled 2023-08-06: qty 30, 30d supply, fill #1

## 2023-07-09 MED ORDER — NERATINIB MALEATE 40 MG PO TABS
160.0000 mg | ORAL_TABLET | Freq: Every day | ORAL | 0 refills | Status: DC
Start: 2023-07-09 — End: 2023-08-01
  Filled 2023-07-09: qty 147, 28d supply, fill #0
  Filled 2023-07-09: qty 147, 36d supply, fill #0

## 2023-07-09 NOTE — Telephone Encounter (Signed)
Oral Chemotherapy Pharmacist Encounter  I spoke with patient for overview of: Nerlynx (neratinib) for the maintenance adjuvant treatment of HER-2+ and ER+/PR- breast cancer, after completion of trastuzumab, planned duration one year.  Counseled patient on administration, dosing, side effects, monitoring, drug-food interactions, safe handling, storage, and disposal.  Nerlynx will be initiated on a dose titration schedule. Antidiarrheal prophylaxis with the use of scheduled budesonide for the first 2 months of Nerlynx therapy will be used. Patient will use loperamide as needed if symptoms of diarrhea occur.   Week 1: Patient will take Nerlynx 40mg  tablets, 4 tablets (160mg ) by mouth once daily with food Week 2: Patient will take Nerlynx 40mg  tablets, 5 tablets (200mg ) by mouth once daily with food Week 3: Patient will take Nerlynx 40mg  tablets, 6 tablets (240mg ) by mouth once daily with food. 240 mg once daily is the target dose of Nerlynx.  Patient knows to avoid grapefruit and grapefruit juice while on treatment with Nerlynx.  Patient instructed to avoid use of PPIs or H2RAs while on treatment with Nerlynx, can separate antacids from Nerlynx by 3 hours if acid suppression is needed.  Nerlynx start date: 07/18/23  Adverse effects include but are not limited to: diarrhea, nausea, vomiting, fatigue and abdominal pain.   Diarrhea Patient instructed to increase or decrease loperamide dose to maintain 1-2 bowel movements per day. Take up to 1-2 capsules TID with a max dose of 16 mg daily.  Patient made aware to contact office if continues to have diarrhea despite taking maximum daily dose of loperamide.  Nausea/Vomiting/Abdominal Pain  Reports that she has a sufficient quantity of both compazine and ondansetron at home to be taken as needed for nausea and vomiting. Confirmed both prescriptions are in date with a sufficient quantity.  Patient is aware that she is able to reach out to our office if  she needs additional antinausea medications.    Reviewed with patient importance of keeping a medication schedule and plan for any missed doses. Patient will receive calendar with dosing schedule via mail. No barriers to medication adherence identified.   Medication reconciliation performed and medication/allergy list updated.  Insurance authorization for Nerlynx has been obtained.  Patient informed the pharmacy will reach out 5-7 days prior to needing next fill of Nerlynx to coordinate continued medication acquisition to prevent break in therapy.  All questions answered.  She voiced understanding and appreciation.   Medication education handout placed in mail for patient. Patient knows to call the office with questions or concerns. [facility] phone number provided to patient.   Jerry Caras, PharmD  07/09/2023  11:53 AM [facility] [phone]

## 2023-07-10 ENCOUNTER — Other Ambulatory Visit (HOSPITAL_COMMUNITY): Payer: Self-pay

## 2023-07-10 ENCOUNTER — Other Ambulatory Visit: Payer: Self-pay

## 2023-07-15 ENCOUNTER — Other Ambulatory Visit (HOSPITAL_COMMUNITY): Payer: Self-pay

## 2023-07-16 ENCOUNTER — Other Ambulatory Visit: Payer: Self-pay

## 2023-07-23 ENCOUNTER — Other Ambulatory Visit (HOSPITAL_COMMUNITY): Payer: PPO

## 2023-07-24 ENCOUNTER — Ambulatory Visit (HOSPITAL_COMMUNITY)
Admission: RE | Admit: 2023-07-24 | Discharge: 2023-07-24 | Disposition: A | Discharge status: Home / Self Care | Payer: PPO | Source: Ambulatory Visit | Attending: Internal Medicine | Admitting: Internal Medicine

## 2023-07-24 ENCOUNTER — Other Ambulatory Visit: Payer: Self-pay | Admitting: Internal Medicine

## 2023-07-24 DIAGNOSIS — Z0189 Encounter for other specified special examinations: Secondary | ICD-10-CM

## 2023-07-24 DIAGNOSIS — C50919 Malignant neoplasm of unspecified site of unspecified female breast: Secondary | ICD-10-CM | POA: Insufficient documentation

## 2023-07-24 DIAGNOSIS — Z9221 Personal history of antineoplastic chemotherapy: Secondary | ICD-10-CM | POA: Diagnosis not present

## 2023-07-24 DIAGNOSIS — I10 Essential (primary) hypertension: Secondary | ICD-10-CM | POA: Diagnosis not present

## 2023-07-24 LAB — ECHOCARDIOGRAM COMPLETE
Area-P 1/2: 3.87 cm2
S' Lateral: 3.9 cm

## 2023-07-29 ENCOUNTER — Ambulatory Visit: Payer: PPO | Attending: Plastic Surgery

## 2023-07-29 ENCOUNTER — Ambulatory Visit: Payer: PPO | Admitting: Surgery

## 2023-07-29 VITALS — Wt 140.2 lb

## 2023-07-29 DIAGNOSIS — Z483 Aftercare following surgery for neoplasm: Secondary | ICD-10-CM | POA: Insufficient documentation

## 2023-07-29 NOTE — Assessment & Plan Note (Signed)
invasive lobular carcinoma, Stage IIIA, c(T3, N1), yp(T3, N1a), ER+/PR-/HER2+, Grade 2  -Diagnosed in 01/2022 -s/p neoadjuvant chemo TCHP X6, followed by right mastectomy by Dr. Dwain Sarna and reconstruction by Dr. Leta Baptist. Unfortunately she did not have much response to neoadjvuant chemo  -last dose adjuvant Kadcyla 06/20/2023. Completed a total of 14 treatments -she received postmastectomy radiation under Dr. Mitzi Hansen, 10/10 - 10/29/22. -she started adjuvant anastrozole in January 2024, she is tolerating very well -Echocardiogram in January 2024 showed slightly decreased EF 50 to 55%.  She was seen by cardiologist after echo, her cardiac medication was adjusted, and we will continue HER2 antibody.  repeated echo on 03/18/23 showed normal EF 55% -Given her ER positive and node positive disease, adjuvant neratinib for 1 year was recommended.  Potential benefit and side effects were discussed with her in detail, especially diarrhea and its management, fatigue, GI upset, abnormal liver function etc., patient agrees to proceed.   -started neratinib 07/18/2023. Has titrated up to 5 tablets daily. On Thursday, 08/01/2023, she will go up to 6 tablets daily.  --thus far, she is tolerating very well. Denies nausea or diarrhea. Has had 2 pound weight loss since her last visit.  --Continues to take arimidex.  --labs are stable.  ---Labs reviewed  -CBC showing WBC 4.7; Hgb 11.2; Hct 34.5; Plt 100; Anc 3.1 -CMP - K 3.9; glucose 105; BUN 17; Creatinine 0.70; eGFR > 60; Ca 9.1; LFTs normal; AlkPhos 142; t.bili 0.6.   -echocardiogram done 07/24/2023  --normal LVF at 55-60% with normal left ventricular diastolic functions. The right ventricle is normal and valves appear normal with preserved function.

## 2023-07-29 NOTE — Therapy (Signed)
OUTPATIENT PHYSICAL THERAPY SOZO SCREENING NOTE   Patient Name: Ashley Robertson MRN: [id number] DOB:Oct 26, 1956, 67 y.o., female Today's Date: 07/29/2023  PCP: Gweneth Dimitri, MD REFERRING PROVIDER: Glenna Fellows, MD   PT End of Session - 07/29/23 1011     Visit Number 2   # unchanged due to screen only   PT Start Time 1010    PT Stop Time 1016    PT Time Calculation (min) 6 min    Activity Tolerance Patient tolerated treatment well    Behavior During Therapy [facility] for tasks assessed/performed             Past Medical History:  Diagnosis Date   Allergy    Lisinopril and lipitor   Anxiety and depression    Aortic atherosclerosis (HCC) 03/2020   CT Chest: 2 V (LAD & LCx) Coronary Atherosclerosis, Aortic Atherosclerosis (no aneurysm).  Mild centrilobular emphysema with mild diffuse bronchial thickening; several scattered small solitary pulmonary nodules (largest 5.6 mm in anterior left upper lobe)   Breast cancer (HCC) 12/2021   Right breast ILC   Carotid artery plaque, bilateral 10/2015   Mild to moderate plaque L>R without significant stenosis   COPD (chronic obstructive pulmonary disease) (HCC)    Coronary Artery Calcification - Score 79    Coronary Calcium Score 79.  LAD and circumflex calcification noted.  Normal ascending aorta with mild calcification.   Current every day smoker    pt quit smoking April 2023   Emphysema lung [facility])    Noted on chest CT   Family history of breast cancer 02/21/2022   Family history of prostate cancer 02/21/2022   Hyperlipidemia    Hypertension    Controlled with amlodipine   Prediabetes    Skin cancer 2021   Remove 2022   Past Surgical History:  Procedure Laterality Date   ABDOMINAL AORTIC ANEURYSM REPAIR N/A 01/11/2023   Procedure: ANEURYSM ABDOMINAL AORTIC REPAIR;  Surgeon: Nada Libman, MD;  Location: [facility] OR;  Service: Vascular;  Laterality: N/A;   BREAST BIOPSY Right    times 3   BREAST RECONSTRUCTION WITH PLACEMENT OF  TISSUE EXPANDER AND ALLODERM Right 07/26/2022   Procedure: RIGHT BREAST RECONSTRUCTION WITH PLACEMENT OF TISSUE EXPANDER AND ALLODERM;  Surgeon: Glenna Fellows, MD;  Location: [facility] OR;  Service: Plastics;  Laterality: Right;   CESAREAN SECTION  1990, 1991   COSMETIC SURGERY  1981   Rhinoplasty   MASTECTOMY Right    MASTECTOMY W/ SENTINEL NODE BIOPSY Right 07/26/2022   Procedure: RIGHT MASTECTOMY, RIGHT AXILLARY SENTINEL NODE BIOPSY;  Surgeon: Emelia Loron, MD;  Location: [facility] OR;  Service: General;  Laterality: Right;  GEN & PEC BLOCK   PORTACATH PLACEMENT Left 03/06/2022   Procedure: INSERTION PORT-A-CATH;  Surgeon: Emelia Loron, MD;  Location: [facility];  Service: General;  Laterality: Left;   RADIOACTIVE SEED GUIDED AXILLARY SENTINEL LYMPH NODE Right 07/26/2022   Procedure: RADIOACTIVE SEED GUIDED AXILLARY SENTINEL LYMPH NODE DISSECTION;  Surgeon: Emelia Loron, MD;  Location: [facility] OR;  Service: General;  Laterality: Right;   RHINOPLASTY  1981   TONSILLECTOMY  1978   TRANSTHORACIC ECHOCARDIOGRAM  10/2016   EF 55 to 60%.  Normal systolic and diastolic function.  No ASD/PFO   TUBAL LIGATION  1991   Patient Active Problem List   Diagnosis Date Noted   AAA (abdominal aortic aneurysm) (HCC) 01/11/2023   S/P AAA repair 01/11/2023   Osteopenia 10/21/2022   Aortic atherosclerosis (HCC) 08/10/2022   S/P mastectomy,  right 07/26/2022   Carotid atherosclerosis 03/28/2022   Coronary artery disease 03/28/2022   Pulmonary emphysema (HCC) 03/28/2022   Recurrent major depression in remission (HCC) 03/28/2022   Port-A-Cath in place 03/27/2022   Genetic testing 03/12/2022   Family history of breast cancer 02/21/2022   Family history of prostate cancer 02/21/2022   Hypercholesterolemia 02/21/2022   Malignant neoplasm of overlapping sites of right breast in female, estrogen receptor positive (HCC) 02/16/2022   Bilateral carotid bruits 04/26/2020   Essential  hypertension 03/19/2020   Hyperlipidemia with target LDL less than 100 03/19/2020   Prediabetes 03/19/2020   Agatston CAC score, <100 03/15/2020    REFERRING DIAG: Rt breast cancer at risk for lymphedema  THERAPY DIAG: Aftercare following surgery for neoplasm  PERTINENT HISTORY: Patient was diagnosed on 02/08/2022 with right grade II invasive lobular carcinoma breast cancer. It measured 2.6 cm and 3.3 cm (2 masses) and is located in the upper outer quadrant. It is ER positive, PR negative, and HER2 positive with a Ki67 of 10%. Patient has COPD and hypertension and a 5cm AAA. She is-s/p right mastectomy  with SLNB 07/26/2022 by Dr. Dwain Sarna and reconstruction by Dr. Leta Baptist. Path showed 9.5 cm invasive lobular carcinoma involving dermis of nipple, margins negative, 2/5 positive lymph nodes. -given the significant residual disease, the recommendation is for postmastectomy radiation and switch to adjuvant Kadcyla  PRECAUTIONS: right UE Lymphedema risk, None  SUBJECTIVE: Pt returns for her 3 month L-Dex screen. "I'm flying to [place] end of Sept."  PAIN:  Are you having pain? No  SOZO SCREENING: Patient was assessed today using the SOZO machine to determine the lymphedema index score. This was compared to her baseline score. It was determined that she is within the recommended range when compared to her baseline and no further action is needed at this time. She will continue SOZO screenings. These are done every 3 months for 2 years post operatively followed by every 6 months for 2 years, and then annually.  Issued info to get measured for a compression sleeve.   L-DEX FLOWSHEETS - 07/29/23 1000       L-DEX LYMPHEDEMA SCREENING   Measurement Type Unilateral    L-DEX MEASUREMENT EXTREMITY Upper Extremity    POSITION  Standing    DOMINANT SIDE Right    At Risk Side Right    BASELINE SCORE (UNILATERAL) -0.2    L-DEX SCORE (UNILATERAL) 5.1    VALUE CHANGE (UNILAT) 5.3                Hermenia Bers, PTA 07/29/2023, 10:18 AM

## 2023-07-29 NOTE — Progress Notes (Unsigned)
Patient Care Team: Gweneth Dimitri, MD as PCP - General (Family Medicine) Marykay Lex, MD as PCP - Cardiology (Cardiology) Pershing Proud, RN as Oncology Nurse Navigator Donnelly Angelica, RN as Oncology Nurse Navigator Emelia Loron, MD as Consulting Physician (General Surgery) Malachy Mood, MD as Consulting Physician (Hematology) Dorothy Puffer, MD as Consulting Physician (Radiation Oncology)  Clinic Day:  07/30/2023  Referring physician: Gweneth Dimitri, MD  ASSESSMENT & PLAN:   Assessment & Plan: Malignant neoplasm of overlapping sites of right breast in female, estrogen receptor positive (HCC) invasive lobular carcinoma, Stage IIIA, c(T3, N1), yp(T3, N1a), ER+/PR-/HER2+, Grade 2  -Diagnosed in 01/2022 -s/p neoadjuvant chemo TCHP X6, followed by right mastectomy by Dr. Dwain Sarna and reconstruction by Dr. Leta Baptist. Unfortunately she did not have much response to neoadjvuant chemo  -last dose adjuvant Kadcyla 06/20/2023. Completed a total of 14 treatments -she received postmastectomy radiation under Dr. Mitzi Hansen, 10/10 - 10/29/22. -she started adjuvant anastrozole in January 2024, she is tolerating very well -Echocardiogram in January 2024 showed slightly decreased EF 50 to 55%.  She was seen by cardiologist after echo, her cardiac medication was adjusted, and we will continue HER2 antibody.  repeated echo on 03/18/23 showed normal EF 55% -Given her ER positive and node positive disease, adjuvant neratinib for 1 year was recommended.  Potential benefit and side effects were discussed with her in detail, especially diarrhea and its management, fatigue, GI upset, abnormal liver function etc., patient agrees to proceed.   -started neratinib 07/18/2023. Has titrated up to 5 tablets daily. On Thursday, 08/01/2023, she will go up to 6 tablets daily.  --thus far, she is tolerating very well. Denies nausea or diarrhea. Has had 2 pound weight loss since her last visit.  --Continues to take arimidex.   --labs are stable.  ---Labs reviewed  -CBC showing WBC 4.7; Hgb 11.2; Hct 34.5; Plt 100; Anc 3.1 -CMP - K 3.9; glucose 105; BUN 17; Creatinine 0.70; eGFR > 60; Ca 9.1; LFTs normal; AlkPhos 142; t.bili 0.6.   -echocardiogram done 07/24/2023  --normal LVF at 55-60% with normal left ventricular diastolic functions. The right ventricle is normal and valves appear normal with preserved function.     Plan: Patient started neratinib 160 mg daily on 07/18/2023. Will increase to goal of 6 tablets daily on Thursday, 08/01/2023  Tolerating this well so far. Denies nausea or diarrhea. She continues to take arimidex daily.  Labs are stable. Platelet count has dropped slightly to 100.  Repeat labs with port flush and follow up in 2 weeks.  Next mammogram 12/18/2023  The patient understands the plans discussed today and is in agreement with them.  She knows to contact our office if she develops concerns prior to her next appointment.  I provided 30 minutes of face-to-face time during this encounter and > 50% was spent counseling as documented under my assessment and plan.    Ashley Jews, NP   CANCER [facility] AT [facility] [address] [place] [address] Dept: [phone] Dept Fax: [phone]   No orders of the defined types were placed in this encounter.     CHIEF COMPLAINT:  CC: f/u right breast cancer in overlapping sites ER+, PR-, HER2+  Current Treatment:  anastrazole 1mg  daily and neratinib 160 mg daily currently at 5 tablets daily. Will increase to 6 tablets daily on 08/01/2023  INTERVAL HISTORY:  Ashley Robertson is here today for repeat clinical assessment. She denies fevers or chills. She  denies pain. Her appetite is good. Her weight has decreased 2 pounds over last 3 weeks .  -Given her ER positive and node positive disease, adjuvant neratinib for 1 year was recommended.  Potential benefit and side effects were discussed with her in  detail, especially diarrhea and its management, fatigue, GI upset, abnormal liver function etc., patient agrees to proceed.   -started neratinib 07/18/2023. Has titrated up to 5 tablets daily. On Thursday, 08/01/2023, she will go up to 6 tablets daily.  --thus far, she is tolerating very well. Denies nausea or diarrhea. Has had 2 pound weight loss since her last visit.  --Continues to take arimidex.  --labs are stable.  ---Labs reviewed  -CBC showing WBC 4.7; Hgb 11.2; Hct 34.5; Plt 100; Anc 3.1 -CMP - K 3.9; glucose 105; BUN 17; Creatinine 0.70; eGFR > 60; Ca 9.1; LFTs normal; AlkPhos 142; t.bili 0.6.   -echocardiogram done 07/24/2023  --normal LVF at 55-60% with normal left ventricular diastolic functions. The right ventricle is normal and valves appear normal with preserved function.   I have reviewed the past medical history, past surgical history, social history and family history with the patient and they are unchanged from previous note.  ALLERGIES:  is allergic to lisinopril and lipitor [atorvastatin].  MEDICATIONS:  Current Outpatient Medications  Medication Sig Dispense Refill   albuterol (PROVENTIL HFA) 108 (90 Base) MCG/ACT inhaler Inhale 1 to 2 puffs into the lungs every 6 hours as needed 8.5 g 2   alendronate (FOSAMAX) 70 MG tablet Take 1 tablet by mouth once weekly --Take 30 minutes before the first food, beverage or medicine of the day with plain water 12 tablet 3   amLODipine (NORVASC) 10 MG tablet Take 1 tablet (10 mg total) by mouth at bedtime. 90 tablet 3   anastrozole (ARIMIDEX) 1 MG tablet Take 1 tablet (1 mg) by mouth daily. 90 tablet 3   aspirin 81 MG tablet Take 81 mg by mouth at bedtime.     Budesonide ER 9 MG TB24 Take 1 tablet (9 mg total) by mouth daily. Start when you start [name], for one to two months 30 tablet 1   Calcium Carb-Cholecalciferol (CALCIUM 500 + D3 PO) Take 1 tablet by mouth daily.     carvedilol (COREG) 6.25 MG tablet Take 1 tablet (6.25 mg  total) by mouth 2 (two) times daily. 60 tablet 4   Cholecalciferol (VITAMIN D) 50 MCG (2000 UT) CAPS Take 1 capsule by mouth daily. 30 capsule 2   ezetimibe (ZETIA) 10 MG tablet Take 1 tablet (10 mg total) by mouth daily. 90 tablet 3   FLUoxetine (PROZAC) 20 MG capsule Take 1 capsule (20 mg total) by mouth daily. 90 capsule 3   Fluticasone-Umeclidin-Vilant (TRELEGY ELLIPTA) 200-62.5-25 MCG/ACT AEPB Inhale 1 puff into the lungs daily. 60 each 2   [name] Maleate (NERLYNX) 40 MG tablet Take 4 tablets (160 mg total) by mouth daily. Take for 7 days. If tolerates well, increase to 5 tablets (200 mg total) by mouth daily for second week. If tolerates, increase to 6 tablets (240 mg total) by mouth daily for third week and thereafter. Take with food. 147 tablet 0   OVER THE COUNTER MEDICATION Take 1 capsule by mouth daily. Serrapeptase 120,000 units daily  Reported on 12/27/2015     rosuvastatin (CRESTOR) 5 MG tablet Take 1 tablet by mouth daily at bedtime ,except on Monday, Wednesday and Fridays take 2 tablets at bedtime as directed 180 tablet 3  Specialty Vitamins Products (MAGNESIUM, AMINO ACID CHELATE,) 133 MG tablet Take 1 tablet by mouth at bedtime.     ibuprofen (ADVIL) 200 MG tablet Take 600 mg by mouth every 6 (six) hours as needed for moderate pain.     No current facility-administered medications for this visit.    HISTORY OF PRESENT ILLNESS:   Oncology History Overview Note   Cancer Staging  Malignant neoplasm of overlapping sites of right breast in female, estrogen receptor positive (HCC) Staging form: Breast, AJCC 8th Edition - Clinical stage from 02/09/2022: Stage IIA (cT2, cN1, cM0, G2, ER+, PR-, HER2+) - Signed by Malachy Mood, MD on 02/20/2022    Malignant neoplasm of overlapping sites of right breast in female, estrogen receptor positive (HCC)  02/08/2022 Mammogram   CLINICAL DATA:  Acute onset right breast lump.  EXAM: DIGITAL DIAGNOSTIC UNILATERAL RIGHT MAMMOGRAM WITH  TOMOSYNTHESIS AND CAD; ULTRASOUND RIGHT BREAST LIMITED  IMPRESSION: 1. Highly suspicious findings mammographically and sonographically. I suspect there is significant malignancy throughout most of the right breast. Discrete masses are seen at 11 o'clock and 6 o'clock.  A single borderline lymph node is identified. This lymph node appears prominent compared to the remainder of the lymph nodes. The skin thickening suggests the possibility of inflammatory breast cancer.   02/09/2022 Cancer Staging   Staging form: Breast, AJCC 8th Edition - Clinical stage from 02/09/2022: Stage IIIA (cT3, cN1, cM0, G2, ER+, PR-, HER2+) - Signed by Malachy Mood, MD on 03/05/2022 Stage prefix: Initial diagnosis Histologic grading system: 3 grade system   02/09/2022 Initial Biopsy   Diagnosis 1. Breast, right, needle core biopsy, right breast 11 o'clock mass ribbon clip - INVASIVE MAMMARY CARCINOMA - SEE COMMENT 2. Breast, right, needle core biopsy, right breast 6'oclock mass, coil clip - INVASIVE MAMMARY CARCINOMA - SEE COMMENT 3. Lymph node, needle/core biopsy, right axillary lymph node, tribell clip - METASTATIC CARCINOMA INVOLVING NODAL TISSUE Microscopic Comment 1. and 2. The biopsy material shows an infiltrative proliferation of cells arranged linearly and in small clusters. Based on the biopsy, the carcinoma appears Nottingham grade 2 of 3 and measures 1.5 cm in greatest linear extent.  Addendum parts 1 and 2: Immunohistochemistry for E-cadherin is negative consistent with lobular carcinoma.  2. PROGNOSTIC INDICATORS Results: The tumor cells are EQUIVOCAL for Her2 (2+). Her2 by FISH will be performed and the results reported separately. Estrogen Receptor: 100%, POSITIVE, STRONG STAINING INTENSITY Progesterone Receptor: <1%, NEGATIVE Proliferation Marker Ki67: 10%  2. FLUORESCENCE IN-SITU HYBRIDIZATION Results: GROUP 1: HER2 **POSITIVE**   3. PROGNOSTIC INDICATORS Results: By immunohistochemistry,  the tumor cells are POSITIVE for Her2 (3+). Estrogen Receptor: 100%, POSITIVE, STRONG STAINING INTENSITY Progesterone Receptor: <1%, NEGATIVE   02/16/2022 Initial Diagnosis   Malignant neoplasm of overlapping sites of right breast in female, estrogen receptor positive (HCC)   03/01/2022 Imaging   EXAM: BILATERAL BREAST MRI WITH AND WITHOUT CONTRAST  IMPRESSION: 1. 8.5 x 8.3 x 5.9 cm area of confluent mass-like enhancement in the right breast involving all 4 quadrants and containing 2 biopsy marker clip artifacts, compatible with 4 quadrant biopsy-proven malignancy. 2. Biopsy-proven metastatic lymph node in the right axilla. 3. Possible metastatic intramammary lymph node in the posterior outer right breast just above the level of the nipple. 4. No evidence of malignancy on the left.   03/01/2022 Genetic Testing   Negative hereditary cancer genetic testing: no pathogenic variants detected in [facility] CustomNext-Cancer +RNAinsight Panel.  Variant of uncertain significance reported in BRIP1 at p.F934V (c.2800T>G). Report date  is March 01, 2022.    The CustomNext-Cancer+RNAinsight panel offered by [employer] includes sequencing and rearrangement analysis for the following 47 genes:  APC, ATM, AXIN2, BARD1, BMPR1A, BRCA1, BRCA2, BRIP1, CDH1, CDK4, CDKN2A, CHEK2, DICER1, EPCAM, GREM1, HOXB13, MEN1, MLH1, MSH2, MSH3, MSH6, MUTYH, NBN, NF1, NF2, NTHL1, PALB2, PMS2, POLD1, POLE, PTEN, RAD51C, RAD51D, RECQL, RET, SDHA, SDHAF2, SDHB, SDHC, SDHD, SMAD4, SMARCA4, STK11, TP53, TSC1, TSC2, and VHL.  RNA data is routinely analyzed for use in variant interpretation for all genes.  UPDATE: BRIP1 p.F934V VUS has been amended to Likely Benign. Amended report date is 11/06/2022.    03/05/2022 Imaging   EXAM: CT CHEST, ABDOMEN, AND PELVIS WITH CONTRAST  IMPRESSION: 1. Asymmetric, masslike density of the glandular tissue of the right breast with overlying skin thickening, in keeping with known primary breast  malignancy. 2. Enlarged, ill-defined right axillary lymph node containing a biopsy marking clip, consistent with known nodal metastatic disease. 3. No other evidence of lymphadenopathy or metastatic disease in the chest, abdomen, or pelvis. 4. Emphysema. Background of fine centrilobular nodularity, most concentrated in the lung apices, consistent with smoking-related respiratory bronchiolitis. 5. Aneurysm of the infrarenal abdominal aorta measuring up to 5.4 x 5.3 cm with a large burden of eccentric mural thrombus. Recommend follow-up CT/MR every 6 months and vascular consultation. This recommendation follows ACR consensus guidelines: White Paper of the ACR Incidental Findings Committee II on Vascular Findings. J Am Coll Radiol 2013; 10:789-794. 6. Coronary artery disease.   03/05/2022 Imaging   EXAM: NUCLEAR MEDICINE WHOLE BODY BONE SCAN  IMPRESSION: No evidence of bony metastatic disease.   03/07/2022 - 08/01/2022 Chemotherapy   Patient is on Treatment Plan : BREAST  Docetaxel + Carboplatin + Trastuzumab + Pertuzumab  (TCHP) q21d      07/26/2022 Surgery   Right mastectomy with axillary node seed guided excision converted to axillary node dissection with reconstruction by Drs. Dwain Sarna and Thimmappa   07/26/2022 Pathology Results   FINAL MICROSCOPIC DIAGNOSIS:   A. LYMPH NODE, RIGHT AXILLARY TARGETED, EXCISION:  - Metastatic carcinoma involving one lymph node, 2 cm (1/1).  - Focal extranodal extension.  - Biopsy site and biopsy clip.   B. BREAST, RIGHT, MASTECTOMY:  - Invasive lobular carcinoma, 9.5 cm (ypT3).  - Carcinoma involves dermis of nipple.  - All surgical margins negative for carcinoma.  - One lymph node negative for metastatic carcinoma (0/1).  - Biopsy sites and biopsy clips.  - See oncology table.   C. LYMPH NODE, RIGHT AXILLARY, SENTINEL, EXCISION:  - One lymph node negative for metastatic carcinoma (0/1).   D. LYMPH NODE, RIGHT AXILLARY, SENTINEL, EXCISION:  -  Metastatic carcinoma in one lymph node, 0.5 cm. (1/1).   E. LYMPH NODE, RIGHT AXILLARY, SENTINEL, EXCISION:  - One lymph node negative for metastatic carcinoma (0/1).    07/26/2022 Cancer Staging   Cancer Staging  Malignant neoplasm of overlapping sites of right breast in female, estrogen receptor positive (HCC) Staging form: Breast, AJCC 8th Edition - Clinical stage from 02/09/2022: Stage IIIA (cT3, cN1, cM0, G2, ER+, PR-, HER2+) - Signed by Malachy Mood, MD on 03/05/2022 Stage prefix: Initial diagnosis Histologic grading system: 3 grade system - Pathologic stage from 07/26/2022: Stage IIIA (pT3, pN1a, cM0, G2, ER+, PR-, HER2+) - Signed by Pollyann Samples, NP on 08/01/2022 Histologic grading system: 3 grade system    07/26/2022 Cancer Staging   Staging form: Breast, AJCC 8th Edition - Pathologic stage from 07/26/2022: Stage IIIA (pT3, pN1a, cM0, G2, ER+,  PR-, HER2+) - Signed by Pollyann Samples, NP on 08/01/2022 Histologic grading system: 3 grade system   08/24/2022 -  Chemotherapy   Patient is on Treatment Plan : BREAST ADO-Trastuzumab Emtansine (Kadcyla) q21d     07/24/2023 Imaging   Echocardiogram  Normal LVF at 55-60 % Normal left diastolic function. Normal right ventricle  Normal visualized valves with preserved function       REVIEW OF SYSTEMS:   Constitutional: Denies fevers, chills or abnormal weight loss Eyes: Denies blurriness of vision Ears, nose, mouth, throat, and face: Denies mucositis or sore throat Respiratory: Denies cough, dyspnea or wheezes Cardiovascular: Denies palpitation, chest discomfort or lower extremity swelling Gastrointestinal:  Denies nausea, heartburn or change in bowel habits Skin: Denies abnormal skin rashes Lymphatics: Denies new lymphadenopathy or easy bruising Neurological:Denies numbness, tingling or new weaknesses Behavioral/Psych: Mood is stable, no new changes  All other systems were reviewed with the patient and are negative.   VITALS:    Today's Vitals   07/30/23 0800 07/30/23 0829  BP:  (!) 149/63  Pulse:  (!) 51  Resp:  18  Temp:  97.8 F (36.6 C)  TempSrc:  Oral  SpO2:  100%  Weight:  141 lb 9.6 oz (64.2 kg)  PainSc: 0-No pain    Body mass index is 20.91 kg/m.   Wt Readings from Last 3 Encounters:  07/30/23 141 lb 9.6 oz (64.2 kg)  07/29/23 140 lb 4 oz (63.6 kg)  07/03/23 143 lb (64.9 kg)    Body mass index is 20.91 kg/m.  Performance status (ECOG): 1 - Symptomatic but completely ambulatory  PHYSICAL EXAM:   Physical Exam Vitals and nursing note reviewed.  Constitutional:      Appearance: Normal appearance. She is well-developed.  HENT:     Head: Normocephalic and atraumatic.     Nose: Nose normal.     Mouth/Throat:     Mouth: Mucous membranes are moist.     Pharynx: Oropharynx is clear.  Eyes:     Extraocular Movements: Extraocular movements intact.     Conjunctiva/sclera: Conjunctivae normal.     Pupils: Pupils are equal, round, and reactive to light.  Neck:     Vascular: No carotid bruit.  Cardiovascular:     Rate and Rhythm: Normal rate and regular rhythm.     Pulses: Normal pulses.     Heart sounds: Normal heart sounds.  Pulmonary:     Effort: Pulmonary effort is normal.     Breath sounds: Normal breath sounds.  Chest:  Breasts:    Left: Normal. No swelling, bleeding, inverted nipple, mass, nipple discharge, skin change or tenderness.     Comments: Right breast reconstruction.  Abdominal:     Palpations: Abdomen is soft.  Musculoskeletal:        General: Normal range of motion.     Cervical back: Normal range of motion and neck supple.  Lymphadenopathy:     Cervical: No cervical adenopathy.     Upper Body:     Right upper body: No supraclavicular, axillary or pectoral adenopathy.     Left upper body: No supraclavicular, axillary or pectoral adenopathy.  Skin:    General: Skin is warm and dry.     Capillary Refill: Capillary refill takes less than 2 seconds.  Neurological:      General: No focal deficit present.     Mental Status: She is alert and oriented to person, place, and time.  Psychiatric:  Mood and Affect: Mood normal.        Behavior: Behavior normal.        Thought Content: Thought content normal.        Judgment: Judgment normal.      LABORATORY DATA:  I have reviewed the data as listed    Component Value Date/Time   NA 140 07/30/2023 0810   NA 135 07/24/2021 0853   K 3.9 07/30/2023 0810   CL 107 07/30/2023 0810   CO2 29 07/30/2023 0810   GLUCOSE 105 (H) 07/30/2023 0810   BUN 17 07/30/2023 0810   BUN 12 07/24/2021 0853   CREATININE 0.70 07/30/2023 0810   CALCIUM 9.1 07/30/2023 0810   PROT 6.3 (L) 07/30/2023 0810   PROT 7.0 08/03/2020 0832   ALBUMIN 3.5 07/30/2023 0810   ALBUMIN 4.1 08/03/2020 0832   AST 27 07/30/2023 0810   ALT 21 07/30/2023 0810   ALKPHOS 142 (H) 07/30/2023 0810   BILITOT 0.6 07/30/2023 0810   GFRNONAA >60 07/30/2023 0810   GFRAA 107 11/03/2020 1206    Lab Results  Component Value Date   WBC 4.7 07/30/2023   NEUTROABS 3.1 07/30/2023   HGB 11.2 (L) 07/30/2023   HCT 34.5 (L) 07/30/2023   MCV 85.0 07/30/2023   PLT 100 (L) 07/30/2023     RADIOGRAPHIC STUDIES: I have personally reviewed the radiological images as listed and agreed with the findings in the report. ECHOCARDIOGRAM COMPLETE  Result Date: 07/24/2023    ECHOCARDIOGRAM LIMITED REPORT   Patient Name:   Ashley Robertson Date of Exam: 07/24/2023 Medical Rec #:  [id number]       Height:       69.0 in Accession #:    [id number]      Weight:       143.0 lb Date of Birth:  11/10/1956      BSA:          1.791 m Patient Age:    66 years        BP:           120/66 mmHg Patient Gender: F               HR:           50 bpm. Exam Location:  Outpatient Procedure: 2D Echo, Cardiac Doppler, Color Doppler and Strain Analysis Indications:    Chemotherapy  History:        Patient has prior history of Echocardiogram examinations, most                 recent  03/18/2023. Risk Factors:Hypertension and Dyslipidemia.                 Breast Cancer. AAA repair.  Sonographer:    Harriette Bouillon RDCS Referring Phys: [phone] MARY E BRANCH IMPRESSIONS  1. Left ventricular ejection fraction, by estimation, is 55 to 60%. The left ventricle has normal function. Left ventricular diastolic parameters were normal.  2. Right ventricular systolic function is normal. The right ventricular size is normal. Tricuspid regurgitation signal is inadequate for assessing PA pressure.  3. The mitral valve is grossly normal. No evidence of mitral valve regurgitation. No evidence of mitral stenosis.  4. The aortic valve was not well visualized. Aortic valve regurgitation is not visualized. No aortic stenosis is present.  5. The inferior vena cava is dilated in size with >50% respiratory variability, suggesting right atrial pressure of 8 mmHg. Comparison(s): No significant change from prior study. FINDINGS  Left Ventricle: Left ventricular ejection fraction, by estimation, is 55 to 60%. The left ventricle has normal function. Global longitudinal strain performed but not reported based on interpreter judgement due to suboptimal tracking. The left ventricular internal cavity size was normal in size. There is no left ventricular hypertrophy. Left ventricular diastolic parameters were normal. Right Ventricle: The right ventricular size is normal. No increase in right ventricular wall thickness. Right ventricular systolic function is normal. Tricuspid regurgitation signal is inadequate for assessing PA pressure. Left Atrium: Left atrial size was normal in size. Right Atrium: Right atrial size was normal in size. Mitral Valve: The mitral valve is grossly normal. No evidence of mitral valve stenosis. Tricuspid Valve: The tricuspid valve is grossly normal. Tricuspid valve regurgitation is trivial. No evidence of tricuspid stenosis. Aortic Valve: The aortic valve was not well visualized. Aortic valve regurgitation  is not visualized. No aortic stenosis is present. Pulmonic Valve: The pulmonic valve was grossly normal. Pulmonic valve regurgitation is not visualized. No evidence of pulmonic stenosis. Aorta: The aortic root is normal in size and structure. Venous: The inferior vena cava is dilated in size with greater than 50% respiratory variability, suggesting right atrial pressure of 8 mmHg. Additional Comments: Spectral Doppler performed.  LEFT VENTRICLE PLAX 2D LVIDd:         5.90 cm   Diastology LVIDs:         3.90 cm   LV e' medial:    7.29 cm/s LV PW:         0.80 cm   LV E/e' medial:  12.2 LV IVS:        0.70 cm   LV e' lateral:   9.36 cm/s LVOT diam:     2.00 cm   LV E/e' lateral: 9.5 LV SV:         83 LV SV Index:   46 LVOT Area:     3.14 cm  RIGHT VENTRICLE             IVC RV S prime:     12.50 cm/s  IVC diam: 2.40 cm TAPSE (M-mode): 2.7 cm LEFT ATRIUM             Index        RIGHT ATRIUM           Index LA diam:        4.40 cm 2.46 cm/m   RA Area:     14.70 cm LA Vol (A2C):   69.0 ml 38.52 ml/m  RA Volume:   38.80 ml  21.66 ml/m LA Vol (A4C):   51.0 ml 28.47 ml/m LA Biplane Vol: 64.4 ml 35.95 ml/m  AORTIC VALVE LVOT Vmax:   109.00 cm/s LVOT Vmean:  79.100 cm/s LVOT VTI:    0.264 m  AORTA Ao Root diam: 2.90 cm MITRAL VALVE MV Area (PHT): 3.87 cm    SHUNTS MV Decel Time: 196 msec    Systemic VTI:  0.26 m MV E velocity: 88.60 cm/s  Systemic Diam: 2.00 cm MV A velocity: 58.00 cm/s MV E/A ratio:  1.53 Lennie Odor MD Electronically signed by Lennie Odor MD Signature Date/Time: 07/24/2023/9:22:07 AM    Final     Addendum I have seen the patient, examined her. I agree with the assessment and and plan and have edited the notes.   Micahya is doing well overall, she is tolerating ramping dose neratinib very well, no diarrhea.  Dose will be increased to full dose later this week.  We again reviewed potential side effect and management, we will see her back in 2 weeks for follow-up.  I spent a total of 25 minutes  for her visit today, more than 50% time on face-to-face counseling  Malachy Mood MD 07/29/2023

## 2023-07-29 NOTE — Progress Notes (Deleted)
[facility]   Telephone:([phone]) [phone] [phone]   Clinic Follow up Note   Patient Care Team: Gweneth Dimitri, MD as PCP - General (Family Medicine) Marykay Lex, MD as PCP - Cardiology (Cardiology) Pershing Proud, RN as Oncology Nurse Navigator Donnelly Angelica, RN as Oncology Nurse Navigator Emelia Loron, MD as Consulting Physician (General Surgery) Malachy Mood, MD as Consulting Physician (Hematology) Dorothy Puffer, MD as Consulting Physician (Radiation Oncology)  Date of Service:  07/29/2023  CHIEF COMPLAINT: f/u of right breast cancer   CURRENT THERAPY:   CURRENT THERAPY:  Kadcyla, q21d, started 08/24/22, plan for a total of 14 cycles  Anastrozole started in 12/2022 [name] 160 mg starting 06/20/2023  ASSESSMENT: *** Ashley Robertson is a 67 y.o. female with   No problem-specific Assessment & Plan notes found for this encounter.  ***   PLAN:     SUMMARY OF ONCOLOGIC HISTORY: Oncology History Overview Note   Cancer Staging  Malignant neoplasm of overlapping sites of right breast in female, estrogen receptor positive (HCC) Staging form: Breast, AJCC 8th Edition - Clinical stage from 02/09/2022: Stage IIA (cT2, cN1, cM0, G2, ER+, PR-, HER2+) - Signed by Malachy Mood, MD on 02/20/2022    Malignant neoplasm of overlapping sites of right breast in female, estrogen receptor positive (HCC)  02/08/2022 Mammogram   CLINICAL DATA:  Acute onset right breast lump.  EXAM: DIGITAL DIAGNOSTIC UNILATERAL RIGHT MAMMOGRAM WITH TOMOSYNTHESIS AND CAD; ULTRASOUND RIGHT BREAST LIMITED  IMPRESSION: 1. Highly suspicious findings mammographically and sonographically. I suspect there is significant malignancy throughout most of the right breast. Discrete masses are seen at 11 o'clock and 6 o'clock.  A single borderline lymph node is identified. This lymph node appears prominent compared to the remainder of the lymph nodes. The skin thickening suggests the  possibility of inflammatory breast cancer.   02/09/2022 Cancer Staging   Staging form: Breast, AJCC 8th Edition - Clinical stage from 02/09/2022: Stage IIIA (cT3, cN1, cM0, G2, ER+, PR-, HER2+) - Signed by Malachy Mood, MD on 03/05/2022 Stage prefix: Initial diagnosis Histologic grading system: 3 grade system   02/09/2022 Initial Biopsy   Diagnosis 1. Breast, right, needle core biopsy, right breast 11 o'clock mass ribbon clip - INVASIVE MAMMARY CARCINOMA - SEE COMMENT 2. Breast, right, needle core biopsy, right breast 6'oclock mass, coil clip - INVASIVE MAMMARY CARCINOMA - SEE COMMENT 3. Lymph node, needle/core biopsy, right axillary lymph node, tribell clip - METASTATIC CARCINOMA INVOLVING NODAL TISSUE Microscopic Comment 1. and 2. The biopsy material shows an infiltrative proliferation of cells arranged linearly and in small clusters. Based on the biopsy, the carcinoma appears Nottingham grade 2 of 3 and measures 1.5 cm in greatest linear extent.  Addendum parts 1 and 2: Immunohistochemistry for E-cadherin is negative consistent with lobular carcinoma.  2. PROGNOSTIC INDICATORS Results: The tumor cells are EQUIVOCAL for Her2 (2+). Her2 by FISH will be performed and the results reported separately. Estrogen Receptor: 100%, POSITIVE, STRONG STAINING INTENSITY Progesterone Receptor: <1%, NEGATIVE Proliferation Marker Ki67: 10%  2. FLUORESCENCE IN-SITU HYBRIDIZATION Results: GROUP 1: HER2 **POSITIVE**   3. PROGNOSTIC INDICATORS Results: By immunohistochemistry, the tumor cells are POSITIVE for Her2 (3+). Estrogen Receptor: 100%, POSITIVE, STRONG STAINING INTENSITY Progesterone Receptor: <1%, NEGATIVE   02/16/2022 Initial Diagnosis   Malignant neoplasm of overlapping sites of right breast in female, estrogen receptor positive (HCC)   03/01/2022 Imaging   EXAM: BILATERAL BREAST MRI WITH AND WITHOUT CONTRAST  IMPRESSION: 1. 8.5 x 8.3 x  5.9 cm area of confluent mass-like enhancement  in the right breast involving all 4 quadrants and containing 2 biopsy marker clip artifacts, compatible with 4 quadrant biopsy-proven malignancy. 2. Biopsy-proven metastatic lymph node in the right axilla. 3. Possible metastatic intramammary lymph node in the posterior outer right breast just above the level of the nipple. 4. No evidence of malignancy on the left.   03/01/2022 Genetic Testing   Negative hereditary cancer genetic testing: no pathogenic variants detected in [employer] CustomNext-Cancer +RNAinsight Panel.  Variant of uncertain significance reported in BRIP1 at p.F934V (c.2800T>G). Report date is March 01, 2022.    The CustomNext-Cancer+RNAinsight panel offered by [employer] includes sequencing and rearrangement analysis for the following 47 genes:  APC, ATM, AXIN2, BARD1, BMPR1A, BRCA1, BRCA2, BRIP1, CDH1, CDK4, CDKN2A, CHEK2, DICER1, EPCAM, GREM1, HOXB13, MEN1, MLH1, MSH2, MSH3, MSH6, MUTYH, NBN, NF1, NF2, NTHL1, PALB2, PMS2, POLD1, POLE, PTEN, RAD51C, RAD51D, RECQL, RET, SDHA, SDHAF2, SDHB, SDHC, SDHD, SMAD4, SMARCA4, STK11, TP53, TSC1, TSC2, and VHL.  RNA data is routinely analyzed for use in variant interpretation for all genes.  UPDATE: BRIP1 p.F934V VUS has been amended to Likely Benign. Amended report date is 11/06/2022.    03/05/2022 Imaging   EXAM: CT CHEST, ABDOMEN, AND PELVIS WITH CONTRAST  IMPRESSION: 1. Asymmetric, masslike density of the glandular tissue of the right breast with overlying skin thickening, in keeping with known primary breast malignancy. 2. Enlarged, ill-defined right axillary lymph node containing a biopsy marking clip, consistent with known nodal metastatic disease. 3. No other evidence of lymphadenopathy or metastatic disease in the chest, abdomen, or pelvis. 4. Emphysema. Background of fine centrilobular nodularity, most concentrated in the lung apices, consistent with smoking-related respiratory bronchiolitis. 5. Aneurysm of the infrarenal  abdominal aorta measuring up to 5.4 x 5.3 cm with a large burden of eccentric mural thrombus. Recommend follow-up CT/MR every 6 months and vascular consultation. This recommendation follows ACR consensus guidelines: White Paper of the ACR Incidental Findings Committee II on Vascular Findings. J Am Coll Radiol 2013; 10:789-794. 6. Coronary artery disease.   03/05/2022 Imaging   EXAM: NUCLEAR MEDICINE WHOLE BODY BONE SCAN  IMPRESSION: No evidence of bony metastatic disease.   03/07/2022 - 08/01/2022 Chemotherapy   Patient is on Treatment Plan : BREAST  Docetaxel + Carboplatin + Trastuzumab + Pertuzumab  (TCHP) q21d      07/26/2022 Surgery   Right mastectomy with axillary node seed guided excision converted to axillary node dissection with reconstruction by Drs. Dwain Sarna and Thimmappa   07/26/2022 Pathology Results   FINAL MICROSCOPIC DIAGNOSIS:   A. LYMPH NODE, RIGHT AXILLARY TARGETED, EXCISION:  - Metastatic carcinoma involving one lymph node, 2 cm (1/1).  - Focal extranodal extension.  - Biopsy site and biopsy clip.   B. BREAST, RIGHT, MASTECTOMY:  - Invasive lobular carcinoma, 9.5 cm (ypT3).  - Carcinoma involves dermis of nipple.  - All surgical margins negative for carcinoma.  - One lymph node negative for metastatic carcinoma (0/1).  - Biopsy sites and biopsy clips.  - See oncology table.   C. LYMPH NODE, RIGHT AXILLARY, SENTINEL, EXCISION:  - One lymph node negative for metastatic carcinoma (0/1).   D. LYMPH NODE, RIGHT AXILLARY, SENTINEL, EXCISION:  - Metastatic carcinoma in one lymph node, 0.5 cm. (1/1).   E. LYMPH NODE, RIGHT AXILLARY, SENTINEL, EXCISION:  - One lymph node negative for metastatic carcinoma (0/1).    07/26/2022 Cancer Staging   Cancer Staging  Malignant neoplasm of overlapping sites of right breast in  female, estrogen receptor positive (HCC) Staging form: Breast, AJCC 8th Edition - Clinical stage from 02/09/2022: Stage IIIA (cT3, cN1, cM0, G2, ER+, PR-,  HER2+) - Signed by Malachy Mood, MD on 03/05/2022 Stage prefix: Initial diagnosis Histologic grading system: 3 grade system - Pathologic stage from 07/26/2022: Stage IIIA (pT3, pN1a, cM0, G2, ER+, PR-, HER2+) - Signed by Pollyann Samples, NP on 08/01/2022 Histologic grading system: 3 grade system    07/26/2022 Cancer Staging   Staging form: Breast, AJCC 8th Edition - Pathologic stage from 07/26/2022: Stage IIIA (pT3, pN1a, cM0, G2, ER+, PR-, HER2+) - Signed by Pollyann Samples, NP on 08/01/2022 Histologic grading system: 3 grade system   08/24/2022 -  Chemotherapy   Patient is on Treatment Plan : BREAST ADO-Trastuzumab Emtansine (Kadcyla) q21d        INTERVAL HISTORY: *** Ashley Robertson is here for a follow up of right breast cancer. She was last seen by me on 06/20/2023. She presents to the clinic      All other systems were reviewed with the patient and are negative.  MEDICAL HISTORY:  Past Medical History:  Diagnosis Date   Allergy    Lisinopril and lipitor   Anxiety and depression    Aortic atherosclerosis (HCC) 03/2020   CT Chest: 2 V (LAD & LCx) Coronary Atherosclerosis, Aortic Atherosclerosis (no aneurysm).  Mild centrilobular emphysema with mild diffuse bronchial thickening; several scattered small solitary pulmonary nodules (largest 5.6 mm in anterior left upper lobe)   Breast cancer (HCC) 12/2021   Right breast ILC   Carotid artery plaque, bilateral 10/2015   Mild to moderate plaque L>R without significant stenosis   COPD (chronic obstructive pulmonary disease) (HCC)    Coronary Artery Calcification - Score 79    Coronary Calcium Score 79.  LAD and circumflex calcification noted.  Normal ascending aorta with mild calcification.   Current every day smoker    pt quit smoking April 2023   Emphysema lung [facility])    Noted on chest CT   Family history of breast cancer 02/21/2022   Family history of prostate cancer 02/21/2022   Hyperlipidemia    Hypertension    Controlled with  amlodipine   Prediabetes    Skin cancer 2021   Remove 2022    SURGICAL HISTORY: Past Surgical History:  Procedure Laterality Date   ABDOMINAL AORTIC ANEURYSM REPAIR N/A 01/11/2023   Procedure: ANEURYSM ABDOMINAL AORTIC REPAIR;  Surgeon: Nada Libman, MD;  Location: [facility] OR;  Service: Vascular;  Laterality: N/A;   BREAST BIOPSY Right    times 3   BREAST RECONSTRUCTION WITH PLACEMENT OF TISSUE EXPANDER AND ALLODERM Right 07/26/2022   Procedure: RIGHT BREAST RECONSTRUCTION WITH PLACEMENT OF TISSUE EXPANDER AND ALLODERM;  Surgeon: Glenna Fellows, MD;  Location: [facility] OR;  Service: Plastics;  Laterality: Right;   CESAREAN SECTION  1990, 1991   COSMETIC SURGERY  1981   Rhinoplasty   MASTECTOMY Right    MASTECTOMY W/ SENTINEL NODE BIOPSY Right 07/26/2022   Procedure: RIGHT MASTECTOMY, RIGHT AXILLARY SENTINEL NODE BIOPSY;  Surgeon: Emelia Loron, MD;  Location: [facility] OR;  Service: General;  Laterality: Right;  GEN & PEC BLOCK   PORTACATH PLACEMENT Left 03/06/2022   Procedure: INSERTION PORT-A-CATH;  Surgeon: Emelia Loron, MD;  Location: [facility];  Service: General;  Laterality: Left;   RADIOACTIVE SEED GUIDED AXILLARY SENTINEL LYMPH NODE Right 07/26/2022   Procedure: RADIOACTIVE SEED GUIDED AXILLARY SENTINEL LYMPH NODE DISSECTION;  Surgeon: Emelia Loron, MD;  Location: [facility] OR;  Service: General;  Laterality: Right;   RHINOPLASTY  1981   TONSILLECTOMY  1978   TRANSTHORACIC ECHOCARDIOGRAM  10/2016   EF 55 to 60%.  Normal systolic and diastolic function.  No ASD/PFO   TUBAL LIGATION  1991    I have reviewed the social history and family history with the patient and they are unchanged from previous note.  ALLERGIES:  is allergic to lisinopril and lipitor [atorvastatin].  MEDICATIONS:  Current Outpatient Medications  Medication Sig Dispense Refill   albuterol (PROVENTIL HFA) 108 (90 Base) MCG/ACT inhaler Inhale 1 to 2 puffs into the lungs every 6 hours as  needed 8.5 g 2   alendronate (FOSAMAX) 70 MG tablet Take 1 tablet by mouth once weekly --Take 30 minutes before the first food, beverage or medicine of the day with plain water 12 tablet 3   amLODipine (NORVASC) 10 MG tablet Take 1 tablet (10 mg total) by mouth at bedtime. 90 tablet 3   anastrozole (ARIMIDEX) 1 MG tablet Take 1 tablet (1 mg) by mouth daily. 90 tablet 3   aspirin 81 MG tablet Take 81 mg by mouth at bedtime.     Budesonide ER 9 MG TB24 Take 1 tablet (9 mg total) by mouth daily. Start when you start [name], for one to two months 30 tablet 1   Calcium Carb-Cholecalciferol (CALCIUM 500 + D3 PO) Take 1 tablet by mouth daily.     carvedilol (COREG) 6.25 MG tablet Take 1 tablet (6.25 mg total) by mouth 2 (two) times daily. 60 tablet 4   Cholecalciferol (VITAMIN D) 50 MCG (2000 UT) CAPS Take 1 capsule by mouth daily. 30 capsule 2   ezetimibe (ZETIA) 10 MG tablet Take 1 tablet (10 mg total) by mouth daily. 90 tablet 3   FLUoxetine (PROZAC) 20 MG capsule Take 1 capsule (20 mg total) by mouth daily. 90 capsule 3   Fluticasone-Umeclidin-Vilant (TRELEGY ELLIPTA) 200-62.5-25 MCG/ACT AEPB Inhale 1 puff into the lungs daily. 60 each 2   ibuprofen (ADVIL) 200 MG tablet Take 600 mg by mouth every 6 (six) hours as needed for moderate pain.     [name] Maleate (NERLYNX) 40 MG tablet Take 4 tablets (160 mg total) by mouth daily. Take for 7 days. If tolerates well, increase to 5 tablets (200 mg total) by mouth daily for second week. If tolerates, increase to 6 tablets (240 mg total) by mouth daily for third week and thereafter. Take with food. 147 tablet 0   OVER THE COUNTER MEDICATION Take 1 capsule by mouth daily. Serrapeptase 120,000 units daily  Reported on 12/27/2015     rosuvastatin (CRESTOR) 5 MG tablet Take 1 tablet by mouth daily at bedtime ,except on Monday, Wednesday and Fridays take 2 tablets at bedtime as directed 180 tablet 3   Specialty Vitamins Products (MAGNESIUM, AMINO ACID CHELATE,)  133 MG tablet Take 1 tablet by mouth at bedtime.     No current facility-administered medications for this visit.    PHYSICAL EXAMINATION: ECOG PERFORMANCE STATUS: {CHL ONC ECOG PS:[phone]}  There were no vitals filed for this visit. Wt Readings from Last 3 Encounters:  07/29/23 140 lb 4 oz (63.6 kg)  07/03/23 143 lb (64.9 kg)  06/20/23 143 lb 14.4 oz (65.3 kg)    {Only keep what was examined. If exam not performed, can use .CEXAM } GENERAL:alert, no distress and comfortable SKIN: skin color, texture, turgor are normal, no rashes or significant lesions EYES: normal, Conjunctiva are pink and  non-injected, sclera clear {OROPHARYNX:no exudate, no erythema and lips, buccal mucosa, and tongue normal}  NECK: supple, thyroid normal size, non-tender, without nodularity LYMPH:  no palpable lymphadenopathy in the cervical, axillary {or inguinal} LUNGS: clear to auscultation and percussion with normal breathing effort HEART: regular rate & rhythm and no murmurs and no lower extremity edema ABDOMEN:abdomen soft, non-tender and normal bowel sounds Musculoskeletal:no cyanosis of digits and no clubbing  NEURO: alert & oriented x 3 with fluent speech, no focal motor/sensory deficits  LABORATORY DATA:  I have reviewed the data as listed    Latest Ref Rng & Units 06/20/2023    8:51 AM 05/30/2023    7:36 AM 05/09/2023    8:02 AM  CBC  WBC 4.0 - 10.5 K/uL 4.8  3.7  3.3   Hemoglobin 12.0 - 15.0 g/dL 29.5  28.4  13.2   Hematocrit 36.0 - 46.0 % 36.0  35.0  35.5   Platelets 150 - 400 K/uL 109  92  85         Latest Ref Rng & Units 06/20/2023    8:51 AM 05/30/2023    7:36 AM 05/09/2023    8:02 AM  CMP  Glucose 70 - 99 mg/dL 440  102  725   BUN 8 - 23 mg/dL 14  11  13    Creatinine 0.44 - 1.00 mg/dL 3.66  4.40  3.47   Sodium 135 - 145 mmol/L 136  139  139   Potassium 3.5 - 5.1 mmol/L 3.9  4.0  4.0   Chloride 98 - 111 mmol/L 101  105  105   CO2 22 - 32 mmol/L 29  28  28    Calcium 8.9 - 10.3  mg/dL 9.5  9.2  9.5   Total Protein 6.5 - 8.1 g/dL 7.5  7.4  7.4   Total Bilirubin 0.3 - 1.2 mg/dL 0.7  0.6  0.6   Alkaline Phos 38 - 126 U/L 207  193  219   AST 15 - 41 U/L 53  45  42   ALT 0 - 44 U/L 29  26  24        RADIOGRAPHIC STUDIES: I have personally reviewed the radiological images as listed and agreed with the findings in the report. No results found.    No orders of the defined types were placed in this encounter.  All questions were answered. The patient knows to call the clinic with any problems, questions or concerns. No barriers to learning was detected. The total time spent in the appointment was {CHL ONC TIME VISIT - [id number]}.     Salome Holmes, CMA 07/29/2023   I, Monica Martinez, CMA, am acting as scribe for Malachy Mood, MD.   {Add scribe attestation statement}

## 2023-07-30 ENCOUNTER — Inpatient Hospital Stay: Payer: PPO | Attending: Hematology

## 2023-07-30 ENCOUNTER — Other Ambulatory Visit (HOSPITAL_COMMUNITY): Payer: Self-pay

## 2023-07-30 ENCOUNTER — Inpatient Hospital Stay: Payer: PPO | Admitting: Nurse Practitioner

## 2023-07-30 ENCOUNTER — Other Ambulatory Visit: Payer: Self-pay

## 2023-07-30 VITALS — BP 149/63 | HR 51 | Temp 97.8°F | Resp 18 | Wt 141.6 lb

## 2023-07-30 DIAGNOSIS — Z9011 Acquired absence of right breast and nipple: Secondary | ICD-10-CM | POA: Diagnosis not present

## 2023-07-30 DIAGNOSIS — Z79811 Long term (current) use of aromatase inhibitors: Secondary | ICD-10-CM | POA: Insufficient documentation

## 2023-07-30 DIAGNOSIS — Z95828 Presence of other vascular implants and grafts: Secondary | ICD-10-CM

## 2023-07-30 DIAGNOSIS — Z9221 Personal history of antineoplastic chemotherapy: Secondary | ICD-10-CM | POA: Diagnosis not present

## 2023-07-30 DIAGNOSIS — Z170 Estrogen receptor positive status [ER+]: Secondary | ICD-10-CM

## 2023-07-30 DIAGNOSIS — C50811 Malignant neoplasm of overlapping sites of right female breast: Secondary | ICD-10-CM | POA: Insufficient documentation

## 2023-07-30 DIAGNOSIS — C773 Secondary and unspecified malignant neoplasm of axilla and upper limb lymph nodes: Secondary | ICD-10-CM | POA: Insufficient documentation

## 2023-07-30 LAB — CMP (CANCER CENTER ONLY)
ALT: 21 U/L (ref 0–44)
AST: 27 U/L (ref 15–41)
Albumin: 3.5 g/dL (ref 3.5–5.0)
Alkaline Phosphatase: 142 U/L — ABNORMAL HIGH (ref 38–126)
Anion gap: 4 — ABNORMAL LOW (ref 5–15)
BUN: 17 mg/dL (ref 8–23)
CO2: 29 mmol/L (ref 22–32)
Calcium: 9.1 mg/dL (ref 8.9–10.3)
Chloride: 107 mmol/L (ref 98–111)
Creatinine: 0.7 mg/dL (ref 0.44–1.00)
GFR, Estimated: >60 mL/min (ref >60)
Glucose, Bld: 105 mg/dL — ABNORMAL HIGH (ref 70–99)
Potassium: 3.9 mmol/L (ref 3.5–5.1)
Sodium: 140 mmol/L (ref 135–145)
Total Bilirubin: 0.6 mg/dL (ref 0.3–1.2)
Total Protein: 6.3 g/dL — ABNORMAL LOW (ref 6.5–8.1)

## 2023-07-30 LAB — CBC WITH DIFFERENTIAL (CANCER CENTER ONLY)
Abs Immature Granulocytes: 0.03 10*3/uL (ref 0.00–0.07)
Basophils Absolute: 0 10*3/uL (ref 0.0–0.1)
Basophils Relative: 1 %
Eosinophils Absolute: 0.2 10*3/uL (ref 0.0–0.5)
Eosinophils Relative: 4 %
HCT: 34.5 % — ABNORMAL LOW (ref 36.0–46.0)
Hemoglobin: 11.2 g/dL — ABNORMAL LOW (ref 12.0–15.0)
Immature Granulocytes: 1 %
Lymphocytes Relative: 19 %
Lymphs Abs: 0.9 10*3/uL (ref 0.7–4.0)
MCH: 27.6 pg (ref 26.0–34.0)
MCHC: 32.5 g/dL (ref 30.0–36.0)
MCV: 85 fL (ref 80.0–100.0)
Monocytes Absolute: 0.5 10*3/uL (ref 0.1–1.0)
Monocytes Relative: 11 %
Neutro Abs: 3.1 10*3/uL (ref 1.7–7.7)
Neutrophils Relative %: 64 %
Platelet Count: 100 10*3/uL — ABNORMAL LOW (ref 150–400)
RBC: 4.06 MIL/uL (ref 3.87–5.11)
RDW: 18.5 % — ABNORMAL HIGH (ref 11.5–15.5)
WBC Count: 4.7 10*3/uL (ref 4.0–10.5)
nRBC: 0 % (ref 0.0–0.2)

## 2023-07-30 MED ORDER — SODIUM CHLORIDE 0.9% FLUSH
10.0000 mL | Freq: Once | INTRAVENOUS | Status: AC
  Administered 2023-07-30: 10 mL

## 2023-07-30 MED ORDER — HEPARIN SOD (PORK) LOCK FLUSH 100 UNIT/ML IV SOLN
500.0000 [IU] | Freq: Once | INTRAVENOUS | Status: AC
  Administered 2023-07-30: 500 [IU]

## 2023-07-31 ENCOUNTER — Other Ambulatory Visit (HOSPITAL_COMMUNITY): Payer: Self-pay

## 2023-07-31 DIAGNOSIS — I10 Essential (primary) hypertension: Secondary | ICD-10-CM

## 2023-07-31 DIAGNOSIS — Z79899 Other long term (current) drug therapy: Secondary | ICD-10-CM

## 2023-07-31 DIAGNOSIS — E785 Hyperlipidemia, unspecified: Secondary | ICD-10-CM

## 2023-07-31 DIAGNOSIS — Z170 Estrogen receptor positive status [ER+]: Secondary | ICD-10-CM

## 2023-08-01 ENCOUNTER — Other Ambulatory Visit: Payer: Self-pay

## 2023-08-01 ENCOUNTER — Other Ambulatory Visit (HOSPITAL_COMMUNITY): Payer: Self-pay

## 2023-08-01 ENCOUNTER — Encounter: Payer: Self-pay | Admitting: Hematology

## 2023-08-01 ENCOUNTER — Other Ambulatory Visit: Payer: Self-pay | Admitting: Hematology

## 2023-08-01 ENCOUNTER — Encounter: Payer: Self-pay | Admitting: Nurse Practitioner

## 2023-08-01 DIAGNOSIS — C50911 Malignant neoplasm of unspecified site of right female breast: Secondary | ICD-10-CM

## 2023-08-01 MED ORDER — NERATINIB MALEATE 40 MG PO TABS
240.0000 mg | ORAL_TABLET | Freq: Every day | ORAL | 0 refills | Status: DC
  Filled 2023-08-01: qty 180, 30d supply, fill #0

## 2023-08-02 ENCOUNTER — Other Ambulatory Visit (HOSPITAL_COMMUNITY): Payer: Self-pay

## 2023-08-14 ENCOUNTER — Other Ambulatory Visit (HOSPITAL_COMMUNITY): Payer: Self-pay

## 2023-08-14 ENCOUNTER — Other Ambulatory Visit: Payer: Self-pay

## 2023-08-14 ENCOUNTER — Inpatient Hospital Stay: Payer: PPO | Attending: Hematology

## 2023-08-14 DIAGNOSIS — C50811 Malignant neoplasm of overlapping sites of right female breast: Secondary | ICD-10-CM | POA: Diagnosis not present

## 2023-08-14 DIAGNOSIS — Z9221 Personal history of antineoplastic chemotherapy: Secondary | ICD-10-CM | POA: Insufficient documentation

## 2023-08-14 DIAGNOSIS — Z79811 Long term (current) use of aromatase inhibitors: Secondary | ICD-10-CM | POA: Diagnosis not present

## 2023-08-14 DIAGNOSIS — Z9011 Acquired absence of right breast and nipple: Secondary | ICD-10-CM | POA: Insufficient documentation

## 2023-08-14 DIAGNOSIS — C773 Secondary and unspecified malignant neoplasm of axilla and upper limb lymph nodes: Secondary | ICD-10-CM | POA: Diagnosis not present

## 2023-08-14 DIAGNOSIS — Z170 Estrogen receptor positive status [ER+]: Secondary | ICD-10-CM | POA: Insufficient documentation

## 2023-08-14 DIAGNOSIS — Z95828 Presence of other vascular implants and grafts: Secondary | ICD-10-CM

## 2023-08-14 LAB — CBC WITH DIFFERENTIAL (CANCER CENTER ONLY)
Abs Immature Granulocytes: 0.01 10*3/uL (ref 0.00–0.07)
Basophils Absolute: 0 10*3/uL (ref 0.0–0.1)
Basophils Relative: 1 %
Eosinophils Absolute: 0.1 10*3/uL (ref 0.0–0.5)
Eosinophils Relative: 2 %
HCT: 34.1 % — ABNORMAL LOW (ref 36.0–46.0)
Hemoglobin: 10.8 g/dL — ABNORMAL LOW (ref 12.0–15.0)
Immature Granulocytes: 0 %
Lymphocytes Relative: 14 %
Lymphs Abs: 0.7 10*3/uL (ref 0.7–4.0)
MCH: 27.8 pg (ref 26.0–34.0)
MCHC: 31.7 g/dL (ref 30.0–36.0)
MCV: 87.9 fL (ref 80.0–100.0)
Monocytes Absolute: 0.6 10*3/uL (ref 0.1–1.0)
Monocytes Relative: 11 %
Neutro Abs: 3.6 10*3/uL (ref 1.7–7.7)
Neutrophils Relative %: 72 %
Platelet Count: 102 10*3/uL — ABNORMAL LOW (ref 150–400)
RBC: 3.88 MIL/uL (ref 3.87–5.11)
RDW: 19.2 % — ABNORMAL HIGH (ref 11.5–15.5)
WBC Count: 5.1 10*3/uL (ref 4.0–10.5)
nRBC: 0 % (ref 0.0–0.2)

## 2023-08-14 LAB — CMP (CANCER CENTER ONLY)
ALT: 26 U/L (ref 0–44)
AST: 34 U/L (ref 15–41)
Albumin: 3.5 g/dL (ref 3.5–5.0)
Alkaline Phosphatase: 134 U/L — ABNORMAL HIGH (ref 38–126)
Anion gap: 3 — ABNORMAL LOW (ref 5–15)
BUN: 17 mg/dL (ref 8–23)
CO2: 31 mmol/L (ref 22–32)
Calcium: 8.7 mg/dL — ABNORMAL LOW (ref 8.9–10.3)
Chloride: 108 mmol/L (ref 98–111)
Creatinine: 0.57 mg/dL (ref 0.44–1.00)
GFR, Estimated: >60 mL/min (ref >60)
Glucose, Bld: 116 mg/dL — ABNORMAL HIGH (ref 70–99)
Potassium: 4 mmol/L (ref 3.5–5.1)
Sodium: 142 mmol/L (ref 135–145)
Total Bilirubin: 0.5 mg/dL (ref 0.3–1.2)
Total Protein: 6.2 g/dL — ABNORMAL LOW (ref 6.5–8.1)

## 2023-08-14 MED ORDER — SCOPOLAMINE 1 MG/3DAYS TD PT72
1.0000 | MEDICATED_PATCH | TRANSDERMAL | 0 refills | Status: DC
  Filled 2023-08-14: qty 4, 12d supply, fill #0

## 2023-08-14 MED ORDER — SODIUM CHLORIDE 0.9% FLUSH
10.0000 mL | Freq: Once | INTRAVENOUS | Status: AC
  Administered 2023-08-14: 10 mL

## 2023-08-14 MED ORDER — HEPARIN SOD (PORK) LOCK FLUSH 100 UNIT/ML IV SOLN
500.0000 [IU] | Freq: Once | INTRAVENOUS | Status: AC
  Administered 2023-08-14: 500 [IU]

## 2023-08-16 ENCOUNTER — Other Ambulatory Visit (HOSPITAL_COMMUNITY): Payer: Self-pay

## 2023-08-16 ENCOUNTER — Ambulatory Visit: Payer: PPO | Admitting: Internal Medicine

## 2023-08-18 ENCOUNTER — Other Ambulatory Visit: Payer: Self-pay | Admitting: Physician Assistant

## 2023-08-18 NOTE — Progress Notes (Unsigned)
Patient Care Team: Gweneth Dimitri, MD as PCP - General (Family Medicine) Marykay Lex, MD as PCP - Cardiology (Cardiology) Pershing Proud, RN as Oncology Nurse Navigator Donnelly Angelica, RN as Oncology Nurse Navigator Emelia Loron, MD as Consulting Physician (General Surgery) Malachy Mood, MD as Consulting Physician (Hematology) Dorothy Puffer, MD as Consulting Physician (Radiation Oncology)   CHIEF COMPLAINT: Follow up right breast cancer   Oncology History Overview Note   Cancer Staging  Malignant neoplasm of overlapping sites of right breast in female, estrogen receptor positive [facility]) Staging form: Breast, AJCC 8th Edition - Clinical stage from 02/09/2022: Stage IIA (cT2, cN1, cM0, G2, ER+, PR-, HER2+) - Signed by Malachy Mood, MD on 02/20/2022    Malignant neoplasm of overlapping sites of right breast in female, estrogen receptor positive (HCC)  02/08/2022 Mammogram   CLINICAL DATA:  Acute onset right breast lump.  EXAM: DIGITAL DIAGNOSTIC UNILATERAL RIGHT MAMMOGRAM WITH TOMOSYNTHESIS AND CAD; ULTRASOUND RIGHT BREAST LIMITED  IMPRESSION: 1. Highly suspicious findings mammographically and sonographically. I suspect there is significant malignancy throughout most of the right breast. Discrete masses are seen at 11 o'clock and 6 o'clock.  A single borderline lymph node is identified. This lymph node appears prominent compared to the remainder of the lymph nodes. The skin thickening suggests the possibility of inflammatory breast cancer.   02/09/2022 Cancer Staging   Staging form: Breast, AJCC 8th Edition - Clinical stage from 02/09/2022: Stage IIIA (cT3, cN1, cM0, G2, ER+, PR-, HER2+) - Signed by Malachy Mood, MD on 03/05/2022 Stage prefix: Initial diagnosis Histologic grading system: 3 grade system   02/09/2022 Initial Biopsy   Diagnosis 1. Breast, right, needle core biopsy, right breast 11 o'clock mass ribbon clip - INVASIVE MAMMARY CARCINOMA - SEE COMMENT 2. Breast, right,  needle core biopsy, right breast 6'oclock mass, coil clip - INVASIVE MAMMARY CARCINOMA - SEE COMMENT 3. Lymph node, needle/core biopsy, right axillary lymph node, tribell clip - METASTATIC CARCINOMA INVOLVING NODAL TISSUE Microscopic Comment 1. and 2. The biopsy material shows an infiltrative proliferation of cells arranged linearly and in small clusters. Based on the biopsy, the carcinoma appears Nottingham grade 2 of 3 and measures 1.5 cm in greatest linear extent.  Addendum parts 1 and 2: Immunohistochemistry for E-cadherin is negative consistent with lobular carcinoma.  2. PROGNOSTIC INDICATORS Results: The tumor cells are EQUIVOCAL for Her2 (2+). Her2 by FISH will be performed and the results reported separately. Estrogen Receptor: 100%, POSITIVE, STRONG STAINING INTENSITY Progesterone Receptor: <1%, NEGATIVE Proliferation Marker Ki67: 10%  2. FLUORESCENCE IN-SITU HYBRIDIZATION Results: GROUP 1: HER2 **POSITIVE**   3. PROGNOSTIC INDICATORS Results: By immunohistochemistry, the tumor cells are POSITIVE for Her2 (3+). Estrogen Receptor: 100%, POSITIVE, STRONG STAINING INTENSITY Progesterone Receptor: <1%, NEGATIVE   02/16/2022 Initial Diagnosis   Malignant neoplasm of overlapping sites of right breast in female, estrogen receptor positive (HCC)   03/01/2022 Imaging   EXAM: BILATERAL BREAST MRI WITH AND WITHOUT CONTRAST  IMPRESSION: 1. 8.5 x 8.3 x 5.9 cm area of confluent mass-like enhancement in the right breast involving all 4 quadrants and containing 2 biopsy marker clip artifacts, compatible with 4 quadrant biopsy-proven malignancy. 2. Biopsy-proven metastatic lymph node in the right axilla. 3. Possible metastatic intramammary lymph node in the posterior outer right breast just above the level of the nipple. 4. No evidence of malignancy on the left.   03/01/2022 Genetic Testing   Negative hereditary cancer genetic testing: no pathogenic variants detected in [facility]  CustomNext-Cancer +RNAinsight Panel.  Variant of uncertain significance reported in BRIP1 at p.F934V (c.2800T>G). Report date is March 01, 2022.    The CustomNext-Cancer+RNAinsight panel offered by [employer] includes sequencing and rearrangement analysis for the following 47 genes:  APC, ATM, AXIN2, BARD1, BMPR1A, BRCA1, BRCA2, BRIP1, CDH1, CDK4, CDKN2A, CHEK2, DICER1, EPCAM, GREM1, HOXB13, MEN1, MLH1, MSH2, MSH3, MSH6, MUTYH, NBN, NF1, NF2, NTHL1, PALB2, PMS2, POLD1, POLE, PTEN, RAD51C, RAD51D, RECQL, RET, SDHA, SDHAF2, SDHB, SDHC, SDHD, SMAD4, SMARCA4, STK11, TP53, TSC1, TSC2, and VHL.  RNA data is routinely analyzed for use in variant interpretation for all genes.  UPDATE: BRIP1 p.F934V VUS has been amended to Likely Benign. Amended report date is 11/06/2022.    03/05/2022 Imaging   EXAM: CT CHEST, ABDOMEN, AND PELVIS WITH CONTRAST  IMPRESSION: 1. Asymmetric, masslike density of the glandular tissue of the right breast with overlying skin thickening, in keeping with known primary breast malignancy. 2. Enlarged, ill-defined right axillary lymph node containing a biopsy marking clip, consistent with known nodal metastatic disease. 3. No other evidence of lymphadenopathy or metastatic disease in the chest, abdomen, or pelvis. 4. Emphysema. Background of fine centrilobular nodularity, most concentrated in the lung apices, consistent with smoking-related respiratory bronchiolitis. 5. Aneurysm of the infrarenal abdominal aorta measuring up to 5.4 x 5.3 cm with a large burden of eccentric mural thrombus. Recommend follow-up CT/MR every 6 months and vascular consultation. This recommendation follows ACR consensus guidelines: White Paper of the ACR Incidental Findings Committee II on Vascular Findings. J Am Coll Radiol 2013; 10:789-794. 6. Coronary artery disease.   03/05/2022 Imaging   EXAM: NUCLEAR MEDICINE WHOLE BODY BONE SCAN  IMPRESSION: No evidence of bony metastatic disease.   03/07/2022  - 08/01/2022 Chemotherapy   Patient is on Treatment Plan : BREAST  Docetaxel + Carboplatin + Trastuzumab + Pertuzumab  (TCHP) q21d      07/26/2022 Surgery   Right mastectomy with axillary node seed guided excision converted to axillary node dissection with reconstruction by Drs. Dwain Sarna and Thimmappa   07/26/2022 Pathology Results   FINAL MICROSCOPIC DIAGNOSIS:   A. LYMPH NODE, RIGHT AXILLARY TARGETED, EXCISION:  - Metastatic carcinoma involving one lymph node, 2 cm (1/1).  - Focal extranodal extension.  - Biopsy site and biopsy clip.   B. BREAST, RIGHT, MASTECTOMY:  - Invasive lobular carcinoma, 9.5 cm (ypT3).  - Carcinoma involves dermis of nipple.  - All surgical margins negative for carcinoma.  - One lymph node negative for metastatic carcinoma (0/1).  - Biopsy sites and biopsy clips.  - See oncology table.   C. LYMPH NODE, RIGHT AXILLARY, SENTINEL, EXCISION:  - One lymph node negative for metastatic carcinoma (0/1).   D. LYMPH NODE, RIGHT AXILLARY, SENTINEL, EXCISION:  - Metastatic carcinoma in one lymph node, 0.5 cm. (1/1).   E. LYMPH NODE, RIGHT AXILLARY, SENTINEL, EXCISION:  - One lymph node negative for metastatic carcinoma (0/1).    07/26/2022 Cancer Staging   Cancer Staging  Malignant neoplasm of overlapping sites of right breast in female, estrogen receptor positive (HCC) Staging form: Breast, AJCC 8th Edition - Clinical stage from 02/09/2022: Stage IIIA (cT3, cN1, cM0, G2, ER+, PR-, HER2+) - Signed by Malachy Mood, MD on 03/05/2022 Stage prefix: Initial diagnosis Histologic grading system: 3 grade system - Pathologic stage from 07/26/2022: Stage IIIA (pT3, pN1a, cM0, G2, ER+, PR-, HER2+) - Signed by Pollyann Samples, NP on 08/01/2022 Histologic grading system: 3 grade system    07/26/2022 Cancer Staging   Staging form: Breast, AJCC 8th Edition -  Pathologic stage from 07/26/2022: Stage IIIA (pT3, pN1a, cM0, G2, ER+, PR-, HER2+) - Signed by Pollyann Samples, NP on  08/01/2022 Histologic grading system: 3 grade system   08/24/2022 -  Chemotherapy   Patient is on Treatment Plan : BREAST ADO-Trastuzumab Emtansine (Kadcyla) q21d     07/24/2023 Imaging   Echocardiogram  Normal LVF at 55-60 % Normal left diastolic function. Normal right ventricle  Normal visualized valves with preserved function      CURRENT THERAPY: Adjuvant Anastrozole and [name] starting 07/18/23  INTERVAL HISTORY Ashley Robertson returns for follow up as scheduled, last seen by my colleague Herbert Seta, NP 07/30/23. She has been escalating [name] currently at 6 tabs daily, and continues Anastrozole. She has no side effects, denies diarrhea on budesonide. Some bruising but no bleeding. She feels great, going to [place] on 9/20 for 8 days.   ROS  All other systems reviewed and negative  Past Medical History:  Diagnosis Date   Allergy    Lisinopril and lipitor   Anxiety and depression    Aortic atherosclerosis (HCC) 03/2020   CT Chest: 2 V (LAD & LCx) Coronary Atherosclerosis, Aortic Atherosclerosis (no aneurysm).  Mild centrilobular emphysema with mild diffuse bronchial thickening; several scattered small solitary pulmonary nodules (largest 5.6 mm in anterior left upper lobe)   Breast cancer (HCC) 12/2021   Right breast ILC   Carotid artery plaque, bilateral 10/2015   Mild to moderate plaque L>R without significant stenosis   COPD (chronic obstructive pulmonary disease) (HCC)    Coronary Artery Calcification - Score 79    Coronary Calcium Score 79.  LAD and circumflex calcification noted.  Normal ascending aorta with mild calcification.   Current every day smoker    pt quit smoking April 2023   Emphysema lung [facility])    Noted on chest CT   Family history of breast cancer 02/21/2022   Family history of prostate cancer 02/21/2022   Hyperlipidemia    Hypertension    Controlled with amlodipine   Prediabetes    Skin cancer 2021   Remove 2022     Past Surgical History:  Procedure  Laterality Date   ABDOMINAL AORTIC ANEURYSM REPAIR N/A 01/11/2023   Procedure: ANEURYSM ABDOMINAL AORTIC REPAIR;  Surgeon: Nada Libman, MD;  Location: [facility] OR;  Service: Vascular;  Laterality: N/A;   BREAST BIOPSY Right    times 3   BREAST RECONSTRUCTION WITH PLACEMENT OF TISSUE EXPANDER AND ALLODERM Right 07/26/2022   Procedure: RIGHT BREAST RECONSTRUCTION WITH PLACEMENT OF TISSUE EXPANDER AND ALLODERM;  Surgeon: Glenna Fellows, MD;  Location: [facility] OR;  Service: Plastics;  Laterality: Right;   CESAREAN SECTION  1990, 1991   COSMETIC SURGERY  1981   Rhinoplasty   MASTECTOMY Right    MASTECTOMY W/ SENTINEL NODE BIOPSY Right 07/26/2022   Procedure: RIGHT MASTECTOMY, RIGHT AXILLARY SENTINEL NODE BIOPSY;  Surgeon: Emelia Loron, MD;  Location: [facility] OR;  Service: General;  Laterality: Right;  GEN & PEC BLOCK   PORTACATH PLACEMENT Left 03/06/2022   Procedure: INSERTION PORT-A-CATH;  Surgeon: Emelia Loron, MD;  Location: [facility];  Service: General;  Laterality: Left;   RADIOACTIVE SEED GUIDED AXILLARY SENTINEL LYMPH NODE Right 07/26/2022   Procedure: RADIOACTIVE SEED GUIDED AXILLARY SENTINEL LYMPH NODE DISSECTION;  Surgeon: Emelia Loron, MD;  Location: [facility] OR;  Service: General;  Laterality: Right;   RHINOPLASTY  1981   TONSILLECTOMY  1978   TRANSTHORACIC ECHOCARDIOGRAM  10/2016   EF 55 to 60%.  Normal systolic  and diastolic function.  No ASD/PFO   TUBAL LIGATION  1991     Outpatient Encounter Medications as of 08/21/2023  Medication Sig   albuterol (PROVENTIL HFA) 108 (90 Base) MCG/ACT inhaler Inhale 1 to 2 puffs into the lungs every 6 hours as needed   alendronate (FOSAMAX) 70 MG tablet Take 1 tablet by mouth once weekly --Take 30 minutes before the first food, beverage or medicine of the day with plain water   amLODipine (NORVASC) 10 MG tablet Take 1 tablet (10 mg total) by mouth at bedtime.   anastrozole (ARIMIDEX) 1 MG tablet Take 1 tablet (1 mg) by mouth  daily.   aspirin 81 MG tablet Take 81 mg by mouth at bedtime.   Budesonide ER 9 MG TB24 Take 1 tablet (9 mg total) by mouth daily. Start when you start [name], for one to two months   Calcium Carb-Cholecalciferol (CALCIUM 500 + D3 PO) Take 1 tablet by mouth daily.   carvedilol (COREG) 6.25 MG tablet Take 1 tablet (6.25 mg total) by mouth 2 (two) times daily.   Cholecalciferol (VITAMIN D) 50 MCG (2000 UT) CAPS Take 1 capsule by mouth daily.   ezetimibe (ZETIA) 10 MG tablet Take 1 tablet (10 mg total) by mouth daily.   FLUoxetine (PROZAC) 20 MG capsule Take 1 capsule (20 mg total) by mouth daily.   Fluticasone-Umeclidin-Vilant (TRELEGY ELLIPTA) 200-62.5-25 MCG/ACT AEPB Inhale 1 puff into the lungs daily.   ibuprofen (ADVIL) 200 MG tablet Take 600 mg by mouth every 6 (six) hours as needed for moderate pain.   [name] Maleate (NERLYNX) 40 MG tablet Take 6 tablets (240 mg total) by mouth daily. Take with food.   OVER THE COUNTER MEDICATION Take 1 capsule by mouth daily. Serrapeptase 120,000 units daily  Reported on 12/27/2015   rosuvastatin (CRESTOR) 5 MG tablet Take 1 tablet by mouth daily at bedtime ,except on Monday, Wednesday and Fridays take 2 tablets at bedtime as directed   scopolamine (TRANSDERM-SCOP) 1 MG/3DAYS Place 1 patch (1.5 mg total) onto the skin behind ear 4 (four) hours before getting on boat. Change every 3 days as needed.   Specialty Vitamins Products (MAGNESIUM, AMINO ACID CHELATE,) 133 MG tablet Take 1 tablet by mouth at bedtime.   [DISCONTINUED] amLODipine (NORVASC) 10 MG tablet Take 1 tablet (10 mg total) by mouth at bedtime.   [DISCONTINUED] hydrochlorothiazide (HYDRODIURIL) 25 MG tablet Take 1 tablet (25 mg total) by mouth daily.   [DISCONTINUED] lisinopril (ZESTRIL) 5 MG tablet Take 1 tablet (5 mg total) by mouth daily.   No facility-administered encounter medications on file as of 08/21/2023.     Today's Vitals   08/21/23 0845  BP: 132/66  Pulse: 62  Resp: 17   Temp: 98.2 F (36.8 C)  TempSrc: Oral  SpO2: 96%  Weight: 144 lb 8 oz (65.5 kg)   Body mass index is 21.34 kg/m.   PHYSICAL EXAM GENERAL:alert, no distress and comfortable SKIN: Scattered ecchymosis over bilateral forearms, no rash  EYES: sclera clear  LUNGS:  normal breathing effort HEART: no lower extremity edema NEURO: alert & oriented x 3 with fluent speech, no focal motor/sensory deficits Breast exam: Deferred PAC without erythema    CBC    Component Value Date/Time   WBC 5.1 08/14/2023 0756   WBC 4.7 01/15/2023 0155   RBC 3.88 08/14/2023 0756   HGB 10.8 (L) 08/14/2023 0756   HCT 34.1 (L) 08/14/2023 0756   PLT 102 (L) 08/14/2023 0756   MCV 87.9  08/14/2023 0756   MCH 27.8 08/14/2023 0756   MCHC 31.7 08/14/2023 0756   RDW 19.2 (H) 08/14/2023 0756   LYMPHSABS 0.7 08/14/2023 0756   MONOABS 0.6 08/14/2023 0756   EOSABS 0.1 08/14/2023 0756   BASOSABS 0.0 08/14/2023 0756     CMP     Component Value Date/Time   NA 142 08/14/2023 0756   NA 135 07/24/2021 0853   K 4.0 08/14/2023 0756   CL 108 08/14/2023 0756   CO2 31 08/14/2023 0756   GLUCOSE 116 (H) 08/14/2023 0756   BUN 17 08/14/2023 0756   BUN 12 07/24/2021 0853   CREATININE 0.57 08/14/2023 0756   CALCIUM 8.7 (L) 08/14/2023 0756   PROT 6.2 (L) 08/14/2023 0756   PROT 7.0 08/03/2020 0832   ALBUMIN 3.5 08/14/2023 0756   ALBUMIN 4.1 08/03/2020 0832   AST 34 08/14/2023 0756   ALT 26 08/14/2023 0756   ALKPHOS 134 (H) 08/14/2023 0756   BILITOT 0.5 08/14/2023 0756   GFRNONAA >60 08/14/2023 0756   GFRAA 107 11/03/2020 1206     ASSESSMENT & PLAN:  invasive lobular carcinoma, Stage IIIA, c(T3, N1), yp(T3, N1a), ER+/PR-/HER2+, Grade 2  -Diagnosed in 01/2022; s/p neoadjuvant chemo TCHP X6, followed by right mastectomy by Dr. Dwain Sarna and reconstruction by Dr. Leta Baptist. Unfortunately she did not have much response to neoadjvuant chemo  -last dose adjuvant Kadcyla 06/20/2023. Completed a total of 14  treatments -S/p postmastectomy radiation under Dr. Mitzi Hansen, 10/10 - 10/29/22. -she started adjuvant anastrozole in January 2024, she is tolerating very well -Echocardiogram in January 2024 showed slightly decreased EF 50 to 55%.  She was seen by cardiologist after echo, her cardiac medication was adjusted. Repeated echo on 03/18/23 showed normal EF 55% -Given her HER2 positive and node positive disease, adjuvant neratinib for 1 year was recommended.  Started 07/18/2023, up to full dose as of 08/01/2023 -Ms. Mosley appears stable.  Tolerating anastrozole and full dose neratinib very well without significant side effects.  She continues supportive care at home.  -Labs from 08/14/23 reviewed, platelet count stable.  She is on calcium supplement which should be taken at least 3 hours apart from [name] -Continue the current regimen, follow-up with lab in 4 weeks -Reviewed travel precautions and strategies to reduce the risk of infection, thrombosis, etc   PLAN: -Recent labs reviewed, platelet count stable -Continue the current regimen, anastrozole and neratinib 6 tabs daily -Lab, flush, and follow-up in 4 weeks -Reviewed travel precautions, strategies to reduce risk of infection, thrombosis, etc    All questions were answered. The patient knows to call the clinic with any problems, questions or concerns. No barriers to learning were detected.   Santiago Glad, NP-C 08/21/2023

## 2023-08-19 ENCOUNTER — Other Ambulatory Visit: Payer: Self-pay

## 2023-08-19 ENCOUNTER — Other Ambulatory Visit (HOSPITAL_COMMUNITY): Payer: Self-pay

## 2023-08-19 MED ORDER — AMLODIPINE BESYLATE 10 MG PO TABS
10.0000 mg | ORAL_TABLET | Freq: Every day | ORAL | 1 refills | Status: DC
  Filled 2023-08-19: qty 90, 90d supply, fill #0
  Filled 2023-11-20: qty 90, 90d supply, fill #1

## 2023-08-21 ENCOUNTER — Inpatient Hospital Stay: Payer: PPO | Admitting: Nurse Practitioner

## 2023-08-21 ENCOUNTER — Other Ambulatory Visit: Payer: Self-pay

## 2023-08-21 ENCOUNTER — Other Ambulatory Visit (HOSPITAL_COMMUNITY): Payer: Self-pay

## 2023-08-21 ENCOUNTER — Encounter: Payer: Self-pay | Admitting: Nurse Practitioner

## 2023-08-21 DIAGNOSIS — C50911 Malignant neoplasm of unspecified site of right female breast: Secondary | ICD-10-CM

## 2023-08-21 DIAGNOSIS — C773 Secondary and unspecified malignant neoplasm of axilla and upper limb lymph nodes: Secondary | ICD-10-CM | POA: Diagnosis not present

## 2023-08-21 DIAGNOSIS — C50811 Malignant neoplasm of overlapping sites of right female breast: Secondary | ICD-10-CM | POA: Diagnosis not present

## 2023-08-21 MED ORDER — NERATINIB MALEATE 40 MG PO TABS
240.0000 mg | ORAL_TABLET | Freq: Every day | ORAL | 0 refills | Status: DC
Start: 2023-08-21 — End: 2023-10-01
  Filled 2023-08-21 – 2023-09-03 (×2): qty 180, 30d supply, fill #0

## 2023-09-03 ENCOUNTER — Other Ambulatory Visit: Payer: Self-pay

## 2023-09-03 NOTE — Progress Notes (Signed)
Specialty Pharmacy Refill Coordination Note  CHAR FELTMAN is a 67 y.o. female contacted today regarding refills of specialty medication(s) [name] Maleate .  Patient requested [name] at [facility] at [place]  on 09/11/23   Medication will be filled on 09/10/23.

## 2023-09-04 ENCOUNTER — Other Ambulatory Visit (HOSPITAL_COMMUNITY): Payer: Self-pay

## 2023-09-04 DIAGNOSIS — Z9011 Acquired absence of right breast and nipple: Secondary | ICD-10-CM | POA: Diagnosis not present

## 2023-09-04 DIAGNOSIS — Z923 Personal history of irradiation: Secondary | ICD-10-CM | POA: Diagnosis not present

## 2023-09-04 DIAGNOSIS — Z853 Personal history of malignant neoplasm of breast: Secondary | ICD-10-CM | POA: Diagnosis not present

## 2023-09-07 ENCOUNTER — Other Ambulatory Visit: Payer: Self-pay

## 2023-09-08 NOTE — Progress Notes (Unsigned)
[facility] [place]  Patient name: Ashley Robertson MRN: [id number] DOB: [birth year] Sex: female   REASON FOR VISIT:    Follow-up  HISOTRY OF PRESENT ILLNESS:    Ashley Robertson is a 67 y.o. female who is status post open repair of a juxtarenal abdominal aortic aneurysm using a 22 x 11 bifurcated graft for a 5.7 cm aneurysm on 01/11/2023.  Her postoperative course was unremarkable.  Her aneurysm was found during a workup for breast cancer.  There is no family history of aneurysmal disease  He has done great since her surgery.  She has completed the infusion part of her chemotherapy for breast cancer and is ready for reconstructive surgery.  She recently got back from [place] where she did a significant amount of walking.  She will occasionally get bilateral leg swelling   Patient has had carotid ultrasounds in the past which show 1 to 39% stenosis.  She has a history of coronary artery calcification with a calcium score of 79.  She is a former smoker.  She is medically managed for hypertension.  She takes a statin for hypercholesterolemia  PAST MEDICAL HISTORY:   Past Medical History:  Diagnosis Date   Allergy    Lisinopril and lipitor   Anxiety and depression    Aortic atherosclerosis (HCC) 03/2020   CT Chest: 2 V (LAD & LCx) Coronary Atherosclerosis, Aortic Atherosclerosis (no aneurysm).  Mild centrilobular emphysema with mild diffuse bronchial thickening; several scattered small solitary pulmonary nodules (largest 5.6 mm in anterior left upper lobe)   Breast cancer (HCC) 12/2021   Right breast ILC   Carotid artery plaque, bilateral 10/2015   Mild to moderate plaque L>R without significant stenosis   COPD (chronic obstructive pulmonary disease) (HCC)    Coronary Artery Calcification - Score 79    Coronary Calcium Score 79.  LAD and circumflex calcification noted.  Normal ascending aorta with mild calcification.   Current  every day smoker    pt quit smoking April 2023   Emphysema lung [facility])    Noted on chest CT   Family history of breast cancer 02/21/2022   Family history of prostate cancer 02/21/2022   Hyperlipidemia    Hypertension    Controlled with amlodipine   Prediabetes    Skin cancer 2021   Remove 2022     FAMILY HISTORY:   Family History  Problem Relation Age of Onset   Transient ischemic attack Mother 14   Parkinson's disease Father    Hyperlipidemia Father    Hypertension Father    Cancer - Other Father        sarcoma--left shoulder   Prostate cancer Father        dx after 47   Healthy Sister    Hypertension Sister    Lung cancer Brother        dx 22s   Healthy Brother    Healthy Brother    Skin cancer Maternal Aunt    Breast cancer Paternal Aunt        dx after 16   Lung cancer Maternal Grandmother        dx 31s   Liver cancer Maternal Grandfather        dx 63s   Breast cancer Niece        dx 30s    SOCIAL HISTORY:   Social History   Tobacco Use   Smoking status: Former    Current packs/day: 0.00    Average  packs/day: 1 pack/day for 40.0 years (40.0 ttl pk-yrs)    Types: Cigarettes    Start date: 04/01/1982    Quit date: 04/01/2022    Years since quitting: 1.4   Smokeless tobacco: Never  Substance Use Topics   Alcohol use: Yes    Alcohol/week: 6.0 standard drinks of alcohol    Types: 6 Glasses of wine per week    Comment: social 4-6 glasses wine weekly per pt     ALLERGIES:   Allergies  Allergen Reactions   Lisinopril Other (See Comments) and Cough    High potassium   Lipitor [Atorvastatin] Other (See Comments)    Myalgias     CURRENT MEDICATIONS:   Current Outpatient Medications  Medication Sig Dispense Refill   albuterol (PROVENTIL HFA) 108 (90 Base) MCG/ACT inhaler Inhale 1 to 2 puffs into the lungs every 6 hours as needed 8.5 g 2   alendronate (FOSAMAX) 70 MG tablet Take 1 tablet by mouth once weekly --Take 30 minutes before the first food,  beverage or medicine of the day with plain water 12 tablet 3   amLODipine (NORVASC) 10 MG tablet Take 1 tablet (10 mg total) by mouth at bedtime. 90 tablet 1   anastrozole (ARIMIDEX) 1 MG tablet Take 1 tablet (1 mg) by mouth daily. 90 tablet 3   aspirin 81 MG tablet Take 81 mg by mouth at bedtime.     Budesonide ER 9 MG TB24 Take 1 tablet (9 mg total) by mouth daily. Start when you start [name], for one to two months 30 tablet 1   Calcium Carb-Cholecalciferol (CALCIUM 500 + D3 PO) Take 1 tablet by mouth daily.     carvedilol (COREG) 6.25 MG tablet Take 1 tablet (6.25 mg total) by mouth 2 (two) times daily. 60 tablet 4   Cholecalciferol (VITAMIN D) 50 MCG (2000 UT) CAPS Take 1 capsule by mouth daily. 30 capsule 2   ezetimibe (ZETIA) 10 MG tablet Take 1 tablet (10 mg total) by mouth daily. 90 tablet 3   FLUoxetine (PROZAC) 20 MG capsule Take 1 capsule (20 mg total) by mouth daily. 90 capsule 3   Fluticasone-Umeclidin-Vilant (TRELEGY ELLIPTA) 200-62.5-25 MCG/ACT AEPB Inhale 1 puff into the lungs daily. 60 each 2   [name] Maleate (NERLYNX) 40 MG tablet Take 6 tablets (240 mg total) by mouth daily. Take with food. 180 tablet 0   OVER THE COUNTER MEDICATION Take 1 capsule by mouth daily. Serrapeptase 120,000 units daily  Reported on 12/27/2015     rosuvastatin (CRESTOR) 5 MG tablet Take 1 tablet by mouth daily at bedtime ,except on Monday, Wednesday and Fridays take 2 tablets at bedtime as directed 180 tablet 3   scopolamine (TRANSDERM-SCOP) 1 MG/3DAYS Place 1 patch (1.5 mg total) onto the skin behind ear 4 (four) hours before getting on boat. Change every 3 days as needed. 4 patch 0   Specialty Vitamins Products (MAGNESIUM, AMINO ACID CHELATE,) 133 MG tablet Take 1 tablet by mouth at bedtime.     No current facility-administered medications for this visit.    REVIEW OF SYSTEMS:   [X]  denotes positive finding, [ ]  denotes negative finding Cardiac  Comments:  Chest pain or chest pressure:     Shortness of breath upon exertion:    Short of breath when lying flat:    Irregular heart rhythm:        Vascular    Pain in calf, thigh, or hip brought on by ambulation:    Pain in feet  at night that wakes you up from your sleep:     Blood clot in your veins:    Leg swelling:  x       Pulmonary    Oxygen at home:    Productive cough:     Wheezing:         Neurologic    Sudden weakness in arms or legs:     Sudden numbness in arms or legs:     Sudden onset of difficulty speaking or slurred speech:    Temporary loss of vision in one eye:     Problems with dizziness:         Gastrointestinal    Blood in stool:     Vomited blood:         Genitourinary    Burning when urinating:     Blood in urine:        Psychiatric    Major depression:         Hematologic    Bleeding problems:    Problems with blood clotting too easily:        Skin    Rashes or ulcers:        Constitutional    Fever or chills:      PHYSICAL EXAM:   Vitals:   09/09/23 0852  BP: (!) 148/75  Pulse: (!) 56  Resp: 20  Temp: 98 F (36.7 C)  SpO2: 95%  Weight: 136 lb (61.7 kg)  Height: 5\' 9"  (1.753 m)    GENERAL: The patient is a well-nourished female, in no acute distress. The vital signs are documented above. CARDIAC: There is a regular rate and rhythm.  VASCULAR: Palpable pedal pulses PULMONARY: Non-labored respirations ABDOMEN: Soft and non-tender.  No incisional hernia MUSCULOSKELETAL: There are no major deformities or cyanosis. NEUROLOGIC: No focal weakness or paresthesias are detected. SKIN: There are no ulcers or rashes noted. PSYCHIATRIC: The patient has a normal affect.  STUDIES:   None  MEDICAL ISSUES:   -Doing very well from open aneurysm repair.  I will have her return in 4 years for ultrasound to evaluate for aneurysmal degeneration of the anastomoses  -She will get carotid surveillance follow-up in 4 years  -She can proceed with reconstructive surgery for her breast  cancer, from my perspective    Charlena Cross, MD, FACS [facility] [phone] Pager [phone]

## 2023-09-09 ENCOUNTER — Encounter: Payer: Self-pay | Admitting: Surgery

## 2023-09-09 ENCOUNTER — Ambulatory Visit: Payer: PPO | Admitting: Surgery

## 2023-09-09 VITALS — BP 148/75 | HR 56 | Temp 98.0°F | Resp 20 | Ht 69.0 in | Wt 136.0 lb

## 2023-09-09 DIAGNOSIS — I7142 Juxtarenal abdominal aortic aneurysm, without rupture: Secondary | ICD-10-CM | POA: Diagnosis not present

## 2023-09-11 ENCOUNTER — Other Ambulatory Visit (HOSPITAL_COMMUNITY): Payer: Self-pay

## 2023-09-17 NOTE — Progress Notes (Unsigned)
Patient Care Team: Gweneth Dimitri, MD as PCP - General (Family Medicine) Marykay Lex, MD as PCP - Cardiology (Cardiology) Pershing Proud, RN as Oncology Nurse Navigator Donnelly Angelica, RN as Oncology Nurse Navigator Emelia Loron, MD as Consulting Physician (General Surgery) Malachy Mood, MD as Consulting Physician (Hematology) Dorothy Puffer, MD as Consulting Physician (Radiation Oncology)   CHIEF COMPLAINT: Follow-up right breast cancer  Oncology History Overview Note   Cancer Staging  Malignant neoplasm of overlapping sites of right breast in female, estrogen receptor positive [facility]) Staging form: Breast, AJCC 8th Edition - Clinical stage from 02/09/2022: Stage IIA (cT2, cN1, cM0, G2, ER+, PR-, HER2+) - Signed by Malachy Mood, MD on 02/20/2022    Malignant neoplasm of overlapping sites of right breast in female, estrogen receptor positive (HCC)  02/08/2022 Mammogram   CLINICAL DATA:  Acute onset right breast lump.  EXAM: DIGITAL DIAGNOSTIC UNILATERAL RIGHT MAMMOGRAM WITH TOMOSYNTHESIS AND CAD; ULTRASOUND RIGHT BREAST LIMITED  IMPRESSION: 1. Highly suspicious findings mammographically and sonographically. I suspect there is significant malignancy throughout most of the right breast. Discrete masses are seen at 11 o'clock and 6 o'clock.  A single borderline lymph node is identified. This lymph node appears prominent compared to the remainder of the lymph nodes. The skin thickening suggests the possibility of inflammatory breast cancer.   02/09/2022 Cancer Staging   Staging form: Breast, AJCC 8th Edition - Clinical stage from 02/09/2022: Stage IIIA (cT3, cN1, cM0, G2, ER+, PR-, HER2+) - Signed by Malachy Mood, MD on 03/05/2022 Stage prefix: Initial diagnosis Histologic grading system: 3 grade system   02/09/2022 Initial Biopsy   Diagnosis 1. Breast, right, needle core biopsy, right breast 11 o'clock mass ribbon clip - INVASIVE MAMMARY CARCINOMA - SEE COMMENT 2. Breast, right,  needle core biopsy, right breast 6'oclock mass, coil clip - INVASIVE MAMMARY CARCINOMA - SEE COMMENT 3. Lymph node, needle/core biopsy, right axillary lymph node, tribell clip - METASTATIC CARCINOMA INVOLVING NODAL TISSUE Microscopic Comment 1. and 2. The biopsy material shows an infiltrative proliferation of cells arranged linearly and in small clusters. Based on the biopsy, the carcinoma appears Nottingham grade 2 of 3 and measures 1.5 cm in greatest linear extent.  Addendum parts 1 and 2: Immunohistochemistry for E-cadherin is negative consistent with lobular carcinoma.  2. PROGNOSTIC INDICATORS Results: The tumor cells are EQUIVOCAL for Her2 (2+). Her2 by FISH will be performed and the results reported separately. Estrogen Receptor: 100%, POSITIVE, STRONG STAINING INTENSITY Progesterone Receptor: <1%, NEGATIVE Proliferation Marker Ki67: 10%  2. FLUORESCENCE IN-SITU HYBRIDIZATION Results: GROUP 1: HER2 **POSITIVE**   3. PROGNOSTIC INDICATORS Results: By immunohistochemistry, the tumor cells are POSITIVE for Her2 (3+). Estrogen Receptor: 100%, POSITIVE, STRONG STAINING INTENSITY Progesterone Receptor: <1%, NEGATIVE   02/16/2022 Initial Diagnosis   Malignant neoplasm of overlapping sites of right breast in female, estrogen receptor positive (HCC)   03/01/2022 Imaging   EXAM: BILATERAL BREAST MRI WITH AND WITHOUT CONTRAST  IMPRESSION: 1. 8.5 x 8.3 x 5.9 cm area of confluent mass-like enhancement in the right breast involving all 4 quadrants and containing 2 biopsy marker clip artifacts, compatible with 4 quadrant biopsy-proven malignancy. 2. Biopsy-proven metastatic lymph node in the right axilla. 3. Possible metastatic intramammary lymph node in the posterior outer right breast just above the level of the nipple. 4. No evidence of malignancy on the left.   03/01/2022 Genetic Testing   Negative hereditary cancer genetic testing: no pathogenic variants detected in [facility]  CustomNext-Cancer +RNAinsight Panel.  Variant  of uncertain significance reported in BRIP1 at p.F934V (c.2800T>G). Report date is March 01, 2022.    The CustomNext-Cancer+RNAinsight panel offered by [employer] includes sequencing and rearrangement analysis for the following 47 genes:  APC, ATM, AXIN2, BARD1, BMPR1A, BRCA1, BRCA2, BRIP1, CDH1, CDK4, CDKN2A, CHEK2, DICER1, EPCAM, GREM1, HOXB13, MEN1, MLH1, MSH2, MSH3, MSH6, MUTYH, NBN, NF1, NF2, NTHL1, PALB2, PMS2, POLD1, POLE, PTEN, RAD51C, RAD51D, RECQL, RET, SDHA, SDHAF2, SDHB, SDHC, SDHD, SMAD4, SMARCA4, STK11, TP53, TSC1, TSC2, and VHL.  RNA data is routinely analyzed for use in variant interpretation for all genes.  UPDATE: BRIP1 p.F934V VUS has been amended to Likely Benign. Amended report date is 11/06/2022.    03/05/2022 Imaging   EXAM: CT CHEST, ABDOMEN, AND PELVIS WITH CONTRAST  IMPRESSION: 1. Asymmetric, masslike density of the glandular tissue of the right breast with overlying skin thickening, in keeping with known primary breast malignancy. 2. Enlarged, ill-defined right axillary lymph node containing a biopsy marking clip, consistent with known nodal metastatic disease. 3. No other evidence of lymphadenopathy or metastatic disease in the chest, abdomen, or pelvis. 4. Emphysema. Background of fine centrilobular nodularity, most concentrated in the lung apices, consistent with smoking-related respiratory bronchiolitis. 5. Aneurysm of the infrarenal abdominal aorta measuring up to 5.4 x 5.3 cm with a large burden of eccentric mural thrombus. Recommend follow-up CT/MR every 6 months and vascular consultation. This recommendation follows ACR consensus guidelines: White Paper of the ACR Incidental Findings Committee II on Vascular Findings. J Am Coll Radiol 2013; 10:789-794. 6. Coronary artery disease.   03/05/2022 Imaging   EXAM: NUCLEAR MEDICINE WHOLE BODY BONE SCAN  IMPRESSION: No evidence of bony metastatic disease.   03/07/2022  - 08/01/2022 Chemotherapy   Patient is on Treatment Plan : BREAST  Docetaxel + Carboplatin + Trastuzumab + Pertuzumab  (TCHP) q21d      07/26/2022 Surgery   Right mastectomy with axillary node seed guided excision converted to axillary node dissection with reconstruction by Drs. Dwain Sarna and Thimmappa   07/26/2022 Pathology Results   FINAL MICROSCOPIC DIAGNOSIS:   A. LYMPH NODE, RIGHT AXILLARY TARGETED, EXCISION:  - Metastatic carcinoma involving one lymph node, 2 cm (1/1).  - Focal extranodal extension.  - Biopsy site and biopsy clip.   B. BREAST, RIGHT, MASTECTOMY:  - Invasive lobular carcinoma, 9.5 cm (ypT3).  - Carcinoma involves dermis of nipple.  - All surgical margins negative for carcinoma.  - One lymph node negative for metastatic carcinoma (0/1).  - Biopsy sites and biopsy clips.  - See oncology table.   C. LYMPH NODE, RIGHT AXILLARY, SENTINEL, EXCISION:  - One lymph node negative for metastatic carcinoma (0/1).   D. LYMPH NODE, RIGHT AXILLARY, SENTINEL, EXCISION:  - Metastatic carcinoma in one lymph node, 0.5 cm. (1/1).   E. LYMPH NODE, RIGHT AXILLARY, SENTINEL, EXCISION:  - One lymph node negative for metastatic carcinoma (0/1).    07/26/2022 Cancer Staging   Cancer Staging  Malignant neoplasm of overlapping sites of right breast in female, estrogen receptor positive (HCC) Staging form: Breast, AJCC 8th Edition - Clinical stage from 02/09/2022: Stage IIIA (cT3, cN1, cM0, G2, ER+, PR-, HER2+) - Signed by Malachy Mood, MD on 03/05/2022 Stage prefix: Initial diagnosis Histologic grading system: 3 grade system - Pathologic stage from 07/26/2022: Stage IIIA (pT3, pN1a, cM0, G2, ER+, PR-, HER2+) - Signed by Pollyann Samples, NP on 08/01/2022 Histologic grading system: 3 grade system    07/26/2022 Cancer Staging   Staging form: Breast, AJCC 8th Edition - Pathologic  stage from 07/26/2022: Stage IIIA (pT3, pN1a, cM0, G2, ER+, PR-, HER2+) - Signed by Pollyann Samples, NP on  08/01/2022 Histologic grading system: 3 grade system   08/24/2022 -  Chemotherapy   Patient is on Treatment Plan : BREAST ADO-Trastuzumab Emtansine (Kadcyla) q21d     07/24/2023 Imaging   Echocardiogram  Normal LVF at 55-60 % Normal left diastolic function. Normal right ventricle  Normal visualized valves with preserved function      CURRENT THERAPY: Adjuvant Anastrozole and [name] starting 07/18/23   INTERVAL HISTORY [name] returns for follow up as scheduled. Last seen by me 08/21/23. She continues anastrozole and full dose neratinib.  She completed budesonide now takes 1 prophylactic Imodium with meds which is helpful.  She used a new moisturizer and has some facial redness, left eyes swelling and irritation.  She also fell forward on cobblestone steps while in [place], bruised her forehead and scraped her knees but has nearly completely healed.  Met with Dr. Leta Baptist who plans port removal and reconstruction in either November or after mammogram in January.  ROS  All other systems reviewed and negative  Past Medical History:  Diagnosis Date   Allergy    Lisinopril and lipitor   Anxiety and depression    Aortic atherosclerosis (HCC) 03/2020   CT Chest: 2 V (LAD & LCx) Coronary Atherosclerosis, Aortic Atherosclerosis (no aneurysm).  Mild centrilobular emphysema with mild diffuse bronchial thickening; several scattered small solitary pulmonary nodules (largest 5.6 mm in anterior left upper lobe)   Breast cancer (HCC) 12/2021   Right breast ILC   Carotid artery plaque, bilateral 10/2015   Mild to moderate plaque L>R without significant stenosis   COPD (chronic obstructive pulmonary disease) (HCC)    Coronary Artery Calcification - Score 79    Coronary Calcium Score 79.  LAD and circumflex calcification noted.  Normal ascending aorta with mild calcification.   Current every day smoker    pt quit smoking April 2023   Emphysema lung [facility])    Noted on chest CT   Family history of  breast cancer 02/21/2022   Family history of prostate cancer 02/21/2022   Hyperlipidemia    Hypertension    Controlled with amlodipine   Prediabetes    Skin cancer 2021   Remove 2022     Past Surgical History:  Procedure Laterality Date   ABDOMINAL AORTIC ANEURYSM REPAIR N/A 01/11/2023   Procedure: ANEURYSM ABDOMINAL AORTIC REPAIR;  Surgeon: Nada Libman, MD;  Location: [facility] OR;  Service: Vascular;  Laterality: N/A;   BREAST BIOPSY Right    times 3   BREAST RECONSTRUCTION WITH PLACEMENT OF TISSUE EXPANDER AND ALLODERM Right 07/26/2022   Procedure: RIGHT BREAST RECONSTRUCTION WITH PLACEMENT OF TISSUE EXPANDER AND ALLODERM;  Surgeon: Glenna Fellows, MD;  Location: [facility] OR;  Service: Plastics;  Laterality: Right;   CESAREAN SECTION  1990, 1991   COSMETIC SURGERY  1981   Rhinoplasty   MASTECTOMY Right    MASTECTOMY W/ SENTINEL NODE BIOPSY Right 07/26/2022   Procedure: RIGHT MASTECTOMY, RIGHT AXILLARY SENTINEL NODE BIOPSY;  Surgeon: Emelia Loron, MD;  Location: [facility] OR;  Service: General;  Laterality: Right;  GEN & PEC BLOCK   PORTACATH PLACEMENT Left 03/06/2022   Procedure: INSERTION PORT-A-CATH;  Surgeon: Emelia Loron, MD;  Location: [facility];  Service: General;  Laterality: Left;   RADIOACTIVE SEED GUIDED AXILLARY SENTINEL LYMPH NODE Right 07/26/2022   Procedure: RADIOACTIVE SEED GUIDED AXILLARY SENTINEL LYMPH NODE DISSECTION;  Surgeon: Dwain Sarna,  Molli Hazard, MD;  [facility];  Service: General;  Laterality: Right;   RHINOPLASTY  1981   TONSILLECTOMY  1978   TRANSTHORACIC ECHOCARDIOGRAM  10/2016   EF 55 to 60%.  Normal systolic and diastolic function.  No ASD/PFO   TUBAL LIGATION  1991     Outpatient Encounter Medications as of 09/18/2023  Medication Sig   albuterol (PROVENTIL HFA) 108 (90 Base) MCG/ACT inhaler Inhale 1 to 2 puffs into the lungs every 6 hours as needed   alendronate (FOSAMAX) 70 MG tablet Take 1 tablet by mouth once weekly --Take 30  minutes before the first food, beverage or medicine of the day with plain water   amLODipine (NORVASC) 10 MG tablet Take 1 tablet (10 mg total) by mouth at bedtime.   anastrozole (ARIMIDEX) 1 MG tablet Take 1 tablet (1 mg) by mouth daily.   aspirin 81 MG tablet Take 81 mg by mouth at bedtime.   Budesonide ER 9 MG TB24 Take 1 tablet (9 mg total) by mouth daily. Start when you start [name], for one to two months   Calcium Carb-Cholecalciferol (CALCIUM 500 + D3 PO) Take 1 tablet by mouth daily.   carvedilol (COREG) 6.25 MG tablet Take 1 tablet (6.25 mg total) by mouth 2 (two) times daily.   Cholecalciferol (VITAMIN D) 50 MCG (2000 UT) CAPS Take 1 capsule by mouth daily.   ezetimibe (ZETIA) 10 MG tablet Take 1 tablet (10 mg total) by mouth daily.   FLUoxetine (PROZAC) 20 MG capsule Take 1 capsule (20 mg total) by mouth daily.   Fluticasone-Umeclidin-Vilant (TRELEGY ELLIPTA) 200-62.5-25 MCG/ACT AEPB Inhale 1 puff into the lungs daily.   [name] Maleate (NERLYNX) 40 MG tablet Take 6 tablets (240 mg total) by mouth daily. Take with food.   OVER THE COUNTER MEDICATION Take 1 capsule by mouth daily. Serrapeptase 120,000 units daily  Reported on 12/27/2015   rosuvastatin (CRESTOR) 5 MG tablet Take 1 tablet by mouth daily at bedtime ,except on Monday, Wednesday and Fridays take 2 tablets at bedtime as directed   scopolamine (TRANSDERM-SCOP) 1 MG/3DAYS Place 1 patch (1.5 mg total) onto the skin behind ear 4 (four) hours before getting on boat. Change every 3 days as needed.   Specialty Vitamins Products (MAGNESIUM, AMINO ACID CHELATE,) 133 MG tablet Take 1 tablet by mouth at bedtime.   [DISCONTINUED] hydrochlorothiazide (HYDRODIURIL) 25 MG tablet Take 1 tablet (25 mg total) by mouth daily.   [DISCONTINUED] lisinopril (ZESTRIL) 5 MG tablet Take 1 tablet (5 mg total) by mouth daily.   No facility-administered encounter medications on file as of 09/18/2023.     Today's Vitals   09/18/23 0835 09/18/23  0854  BP: (!) 113/59   Pulse: 62   Resp: 18   Temp: 98.3 F (36.8 C)   TempSrc: Tympanic   SpO2: 96%   Weight: 138 lb 3.2 oz (62.7 kg)   Height: 5\' 9"  (1.753 m)   PainSc:  0-No pain   Body mass index is 20.41 kg/m.   PHYSICAL EXAM GENERAL:alert, no distress and comfortable SKIN: Healing ecchymosis to the forehead.  Facial erythema and mild left soft tissue/ periorbital edema  EYES: sclera clear LUNGS:  normal breathing effort NEURO: alert & oriented x 3 with fluent speech, no focal motor/sensory deficits Breast exam: deferred PAC without erythema    CBC    Component Value Date/Time   WBC 3.8 (L) 09/18/2023 0821   WBC 4.7 01/15/2023 0155   RBC 3.51 (L) 09/18/2023 1610  HGB 9.8 (L) 09/18/2023 0821   HCT 30.5 (L) 09/18/2023 0821   PLT 128 (L) 09/18/2023 0821   MCV 86.9 09/18/2023 0821   MCH 27.9 09/18/2023 0821   MCHC 32.1 09/18/2023 0821   RDW 17.7 (H) 09/18/2023 0821   LYMPHSABS 0.8 09/18/2023 0821   MONOABS 0.5 09/18/2023 0821   EOSABS 0.0 09/18/2023 0821   BASOSABS 0.0 09/18/2023 0821     CMP     Component Value Date/Time   NA 137 09/18/2023 0821   NA 135 07/24/2021 0853   K 4.2 09/18/2023 0821   CL 103 09/18/2023 0821   CO2 26 09/18/2023 0821   GLUCOSE 98 09/18/2023 0821   BUN 17 09/18/2023 0821   BUN 12 07/24/2021 0853   CREATININE 0.72 09/18/2023 0821   CALCIUM 8.7 (L) 09/18/2023 0821   PROT 6.4 (L) 09/18/2023 0821   PROT 7.0 08/03/2020 0832   ALBUMIN 2.9 (L) 09/18/2023 0821   ALBUMIN 4.1 08/03/2020 0832   AST 32 09/18/2023 0821   ALT 27 09/18/2023 0821   ALKPHOS 113 09/18/2023 0821   BILITOT 0.7 09/18/2023 0821   GFRNONAA >60 09/18/2023 0821   GFRAA 107 11/03/2020 1206     ASSESSMENT & PLAN: 67 yo female   Invasive lobular carcinoma, Stage IIIA, c(T3, N1), yp(T3, N1a), ER+/PR-/HER2+, Grade 2  -Diagnosed in 01/2022; s/p neoadjuvant chemo TCHP X6, followed by right mastectomy by Dr. Dwain Sarna and reconstruction by Dr. Leta Baptist. Unfortunately  she did not have much response to neoadjvuant chemo  -last dose adjuvant Kadcyla 06/20/2023. Completed a total of 14 treatments -S/p postmastectomy radiation under Dr. Mitzi Hansen, 10/10 - 10/29/22. -she started adjuvant anastrozole in January 2024, she is tolerating very well -Echocardiogram in January 2024 showed slightly decreased EF 50 to 55%.  She was seen by cardiologist after echo, her cardiac medication was adjusted. Repeated echo on 03/18/23 showed normal EF 55% -Given her HER2 positive and node positive disease, adjuvant neratinib for 1 year was recommended.  Started 07/18/2023, up to full dose as of 08/01/2023 -[name] appears stable, tolerating Anastrozole and full dose [name] very well overall, no significant SEs. Continue prophylactic/supportive care regimen. -She fell on uneven side walk in [place] and appears to be having mild dermatitis from new skin cream, healing well from both   -CBC is stable, with mild leukopenia, anemia, and thrombocytopenia. Labs adequate to continue regimen -She may have port removed and reconstruction in November, or after mammo in January depending on Dr. Leta Baptist; we discussed holding [name] 1 week prior and 2 weeks post op -F/up in 6 - 8 weeks, she prefers 8 weeks; or sooner if needed     PLAN: -CBC is stable -Continue Anastrozole and full dose [name] -She may have port removed and reconstruction in November, or after mammo in January, per Dr. Leta Baptist; we discussed holding [name] 1 week prior and 2 weeks post surgery. OK to continue Anastrozole through surgery -F/up in 6 - 8 weeks, she prefers 8 weeks; or sooner if needed -CC note to Dr. Leta Baptist    All questions were answered. The patient knows to call the clinic with any problems, questions or concerns. No barriers to learning were detected.   Santiago Glad, NP-C 09/18/2023

## 2023-09-18 ENCOUNTER — Encounter: Payer: Self-pay | Admitting: Nurse Practitioner

## 2023-09-18 ENCOUNTER — Inpatient Hospital Stay: Payer: PPO | Attending: Hematology | Admitting: Nurse Practitioner

## 2023-09-18 ENCOUNTER — Inpatient Hospital Stay: Payer: PPO

## 2023-09-18 VITALS — BP 113/59 | HR 62 | Temp 98.3°F | Resp 18 | Ht 69.0 in | Wt 138.2 lb

## 2023-09-18 DIAGNOSIS — D649 Anemia, unspecified: Secondary | ICD-10-CM | POA: Diagnosis not present

## 2023-09-18 DIAGNOSIS — Z1231 Encounter for screening mammogram for malignant neoplasm of breast: Secondary | ICD-10-CM | POA: Diagnosis not present

## 2023-09-18 DIAGNOSIS — C773 Secondary and unspecified malignant neoplasm of axilla and upper limb lymph nodes: Secondary | ICD-10-CM | POA: Insufficient documentation

## 2023-09-18 DIAGNOSIS — Z170 Estrogen receptor positive status [ER+]: Secondary | ICD-10-CM

## 2023-09-18 DIAGNOSIS — Z79811 Long term (current) use of aromatase inhibitors: Secondary | ICD-10-CM | POA: Insufficient documentation

## 2023-09-18 DIAGNOSIS — C50811 Malignant neoplasm of overlapping sites of right female breast: Secondary | ICD-10-CM | POA: Diagnosis not present

## 2023-09-18 DIAGNOSIS — Z9011 Acquired absence of right breast and nipple: Secondary | ICD-10-CM | POA: Diagnosis not present

## 2023-09-18 DIAGNOSIS — Z95828 Presence of other vascular implants and grafts: Secondary | ICD-10-CM

## 2023-09-18 DIAGNOSIS — C50911 Malignant neoplasm of unspecified site of right female breast: Secondary | ICD-10-CM

## 2023-09-18 DIAGNOSIS — D72819 Decreased white blood cell count, unspecified: Secondary | ICD-10-CM | POA: Insufficient documentation

## 2023-09-18 LAB — CMP (CANCER CENTER ONLY)
ALT: 27 U/L (ref 0–44)
AST: 32 U/L (ref 15–41)
Albumin: 2.9 g/dL — ABNORMAL LOW (ref 3.5–5.0)
Alkaline Phosphatase: 113 U/L (ref 38–126)
Anion gap: 8 (ref 5–15)
BUN: 17 mg/dL (ref 8–23)
CO2: 26 mmol/L (ref 22–32)
Calcium: 8.7 mg/dL — ABNORMAL LOW (ref 8.9–10.3)
Chloride: 103 mmol/L (ref 98–111)
Creatinine: 0.72 mg/dL (ref 0.44–1.00)
GFR, Estimated: >60 mL/min (ref >60)
Glucose, Bld: 98 mg/dL (ref 70–99)
Potassium: 4.2 mmol/L (ref 3.5–5.1)
Sodium: 137 mmol/L (ref 135–145)
Total Bilirubin: 0.7 mg/dL (ref 0.3–1.2)
Total Protein: 6.4 g/dL — ABNORMAL LOW (ref 6.5–8.1)

## 2023-09-18 LAB — CBC WITH DIFFERENTIAL (CANCER CENTER ONLY)
Abs Immature Granulocytes: 0.05 10*3/uL (ref 0.00–0.07)
Basophils Absolute: 0 10*3/uL (ref 0.0–0.1)
Basophils Relative: 1 %
Eosinophils Absolute: 0 10*3/uL (ref 0.0–0.5)
Eosinophils Relative: 0 %
HCT: 30.5 % — ABNORMAL LOW (ref 36.0–46.0)
Hemoglobin: 9.8 g/dL — ABNORMAL LOW (ref 12.0–15.0)
Immature Granulocytes: 1 %
Lymphocytes Relative: 22 %
Lymphs Abs: 0.8 10*3/uL (ref 0.7–4.0)
MCH: 27.9 pg (ref 26.0–34.0)
MCHC: 32.1 g/dL (ref 30.0–36.0)
MCV: 86.9 fL (ref 80.0–100.0)
Monocytes Absolute: 0.5 10*3/uL (ref 0.1–1.0)
Monocytes Relative: 12 %
Neutro Abs: 2.4 10*3/uL (ref 1.7–7.7)
Neutrophils Relative %: 64 %
Platelet Count: 128 10*3/uL — ABNORMAL LOW (ref 150–400)
RBC: 3.51 MIL/uL — ABNORMAL LOW (ref 3.87–5.11)
RDW: 17.7 % — ABNORMAL HIGH (ref 11.5–15.5)
WBC Count: 3.8 10*3/uL — ABNORMAL LOW (ref 4.0–10.5)
nRBC: 0 % (ref 0.0–0.2)

## 2023-09-18 MED ORDER — SODIUM CHLORIDE 0.9% FLUSH
10.0000 mL | Freq: Once | INTRAVENOUS | Status: AC
  Administered 2023-09-18: 10 mL

## 2023-09-18 MED ORDER — HEPARIN SOD (PORK) LOCK FLUSH 100 UNIT/ML IV SOLN
500.0000 [IU] | Freq: Once | INTRAVENOUS | Status: AC
  Administered 2023-09-18: 500 [IU]

## 2023-09-19 ENCOUNTER — Other Ambulatory Visit (HOSPITAL_COMMUNITY): Payer: Self-pay

## 2023-09-19 DIAGNOSIS — L239 Allergic contact dermatitis, unspecified cause: Secondary | ICD-10-CM | POA: Diagnosis not present

## 2023-09-19 MED ORDER — TRIAMCINOLONE ACETONIDE 0.025 % EX CREA
TOPICAL_CREAM | CUTANEOUS | 0 refills | Status: DC
  Filled 2023-09-19: qty 15, 30d supply, fill #0

## 2023-09-26 ENCOUNTER — Encounter: Payer: Self-pay | Admitting: Pulmonary Disease

## 2023-09-26 ENCOUNTER — Ambulatory Visit: Payer: PPO | Admitting: Pulmonary Disease

## 2023-09-26 VITALS — BP 124/60 | HR 66 | Temp 98.3°F | Ht 69.0 in | Wt 137.0 lb

## 2023-09-26 DIAGNOSIS — J439 Emphysema, unspecified: Secondary | ICD-10-CM

## 2023-09-26 DIAGNOSIS — J4489 Other specified chronic obstructive pulmonary disease: Secondary | ICD-10-CM

## 2023-09-26 NOTE — Patient Instructions (Signed)
VISIT SUMMARY:  During your visit, we discussed your recent trip to [place] and how your breathing was generally well controlled, although you experienced some shortness of breath with exertion. We also reviewed your ongoing treatment for breast cancer and your upcoming reconstructive surgery. Your COPD management and cancer treatment plans were assessed and updated accordingly.  YOUR PLAN:  -CHRONIC OBSTRUCTIVE PULMONARY DISEASE (COPD): COPD is a chronic lung disease that makes it hard to breathe. You are currently stable on the Trelegy inhaler, which you should continue using as prescribed. No exacerbations were reported during your recent travel.  -BREAST CANCER: Breast cancer is a type of cancer that forms in the cells of the breasts. You have completed chemotherapy and are currently on Nerlynx, a HER2 inhibitor, which you will continue for a year. You have reconstructive surgery planned for January 2025 following a mammogram. Continue your current cancer treatment as directed by your oncology team. Check with oncology regarding annual CT scans during your follow-up in December 2024.  -GENERAL HEALTH MAINTENANCE: For your general health, follow up in one year for routine COPD management. If your oncology team does not order an annual CT scan, consider scheduling one in April 2025.  INSTRUCTIONS:  Follow up with your oncology team in December 2024 to discuss the possibility of annual CT scans. Continue using your Trelegy inhaler as prescribed. Plan for reconstructive surgery in January 2025 following your mammogram. Schedule a follow-up appointment in one year for routine COPD management.

## 2023-09-26 NOTE — Progress Notes (Signed)
Ashley Robertson    [id number]    1956-04-01  Primary Care Physician:McNeill, Toniann Fail, MD  Referring Physician: Gweneth Dimitri, MD [address] [place],  [place] [address]  Chief complaint: Follow up for COPD  HPI: 67 y.o. Ex smoker with history of hypertension, hyperlipidemia, emphysema Referred for evaluation of mild wheezing heard at primary care office  Symptoms are intermittent wheezing.  Occasional cough with white mucus.  No symptoms of dyspnea or exercise limitation.  She has an albuterol inhaler which she uses once a month.  No history of exacerbations or hospitalizations for respiratory issues  Pets: Dog Occupation: Retired Scientist, forensic at [employer]: No mold, hot tub, Jacuzzi.  No feather pillows or comforter Smoking history: 41-pack-year smoker.  Quit smoking in April 2023 Travel history: No significant travel history Relevant family history: No family history of lung disease  Interim history: Discussed the use of AI scribe software for clinical note transcription with the patient, who gave verbal consent to proceed.  The patient, with a history of COPD and breast cancer, recently returned from a trip to [place]. She reports that her breathing was generally well controlled, but she experienced shortness of breath with exertion, particularly when climbing the numerous steps encountered during her travels. She continues to use the Trelegy inhaler for COPD management, and recently, the cost of this medication has been covered, possibly due to intervention from the cancer center.  Regarding her breast cancer, she completed chemotherapy infusions in July and started on the HER2 inhibitor, Nerlynx, in August, which she will continue for a year. She underwent radiation therapy last October and November. She is scheduled for reconstructive surgery in January, following a mammogram. She expresses a desire for regular CT scans, but notes that she was removed from a  lung cancer screening program following her cancer diagnosis.   Outpatient Encounter Medications as of 09/26/2023  Medication Sig   alendronate (FOSAMAX) 70 MG tablet Take 1 tablet by mouth once weekly --Take 30 minutes before the first food, beverage or medicine of the day with plain water   amLODipine (NORVASC) 10 MG tablet Take 1 tablet (10 mg total) by mouth at bedtime.   anastrozole (ARIMIDEX) 1 MG tablet Take 1 tablet (1 mg) by mouth daily.   aspirin 81 MG tablet Take 81 mg by mouth at bedtime.   Calcium Carb-Cholecalciferol (CALCIUM 500 + D3 PO) Take 1 tablet by mouth daily.   carvedilol (COREG) 6.25 MG tablet Take 1 tablet (6.25 mg total) by mouth 2 (two) times daily.   Cholecalciferol (VITAMIN D) 50 MCG (2000 UT) CAPS Take 1 capsule by mouth daily.   ezetimibe (ZETIA) 10 MG tablet Take 1 tablet (10 mg total) by mouth daily.   FLUoxetine (PROZAC) 20 MG capsule Take 1 capsule (20 mg total) by mouth daily.   Fluticasone-Umeclidin-Vilant (TRELEGY ELLIPTA) 200-62.5-25 MCG/ACT AEPB Inhale 1 puff into the lungs daily.   [name] Maleate (NERLYNX) 40 MG tablet Take 6 tablets (240 mg total) by mouth daily. Take with food.   OVER THE COUNTER MEDICATION Take 1 capsule by mouth daily. Serrapeptase 120,000 units daily  Reported on 12/27/2015   rosuvastatin (CRESTOR) 5 MG tablet Take 1 tablet by mouth daily at bedtime ,except on Monday, Wednesday and Fridays take 2 tablets at bedtime as directed   Specialty Vitamins Products (MAGNESIUM, AMINO ACID CHELATE,) 133 MG tablet Take 1 tablet by mouth at bedtime.   triamcinolone (KENALOG) 0.025 % cream  Apply 2 (two) times a day to rash on face for 1 week; not safe for long term use   albuterol (PROVENTIL HFA) 108 (90 Base) MCG/ACT inhaler Inhale 1 to 2 puffs into the lungs every 6 hours as needed (Patient not taking: Reported on 09/26/2023)   Budesonide ER 9 MG TB24 Take 1 tablet (9 mg total) by mouth daily. Start when you start [name], for one to two  months (Patient not taking: Reported on 09/26/2023)   scopolamine (TRANSDERM-SCOP) 1 MG/3DAYS Place 1 patch (1.5 mg total) onto the skin behind ear 4 (four) hours before getting on boat. Change every 3 days as needed. (Patient not taking: Reported on 09/26/2023)   [DISCONTINUED] hydrochlorothiazide (HYDRODIURIL) 25 MG tablet Take 1 tablet (25 mg total) by mouth daily.   [DISCONTINUED] lisinopril (ZESTRIL) 5 MG tablet Take 1 tablet (5 mg total) by mouth daily.   No facility-administered encounter medications on file as of 09/26/2023.    Physical Exam: Blood pressure 124/60, pulse 66, temperature 98.3 F (36.8 C), temperature source Temporal, height 5\' 9"  (1.753 m), weight 137 lb (62.1 kg), SpO2 98%. Gen:      No acute distress HEENT:  EOMI, sclera anicteric Neck:     No masses; no thyromegaly Lungs:    Clear to auscultation bilaterally; normal respiratory effort CV:         Regular rate and rhythm; no murmurs Abd:      + bowel sounds; soft, non-tender; no palpable masses, no distension Ext:    No edema; adequate peripheral perfusion Skin:      Warm and dry; no rash Neuro: alert and oriented x 3 Psych: normal mood and affect   Data Reviewed: Imaging: CT chest 03/09/2020- mild emphysema, 5 proximal 6 mm lung nodule, coronary atherosclerosis.  Screening CT chest 04/10/2021-mild centrilobular emphysema, mild biapical scarring stable pulmonary nodule. CT chest 03/05/2022-right breast mass, axillary lymph node enlargement, emphysema with fine centrilobular nodularity, infrarenal abdominal aortic aneurysm I have reviewed the images personally.  PFTs: 04/10/2021 FVC 3.56 [93%], FEV1 2.22 [76%], F/F 63, TLC 7.23 [124%], DLCO 18.57 [79%] Moderate obstruction with bronchodilator response  Labs: CBC 08/31/2021- WBC 7.5, eos 1.5%, absolute eosinophil count 112.5 IgE 08/31/2021- 20 Alpha-1 antitrypsin 08/31/2021- 205, PI MM  Assessment:  Chronic Obstructive Pulmonary Disease (COPD) Stable on Trelegy  inhaler. No exacerbations reported during recent travel involving physical exertion. -Continue Trelegy inhaler as prescribed.  Breast Cancer Completed chemotherapy infusions in July 2024. Currently on Nerlynx (neratinib), a HER2 inhibitor, started in August 2024. Reconstruction planned for January 2025 following mammogram. -Continue current cancer treatment as directed by oncology. -Check with oncology regarding annual CT scans during follow-up in December 2024.  General Health Maintenance / Followup Plans -Follow-up in 1 year for routine COPD management. -If oncology does not order annual CT, consider ordering one  Plan/Recommendations: Continue Trelegy  Chilton Greathouse MD [facility] 09/26/2023, 9:10 AM  CC: Gweneth Dimitri, MD

## 2023-09-28 ENCOUNTER — Other Ambulatory Visit: Payer: Self-pay | Admitting: Internal Medicine

## 2023-09-28 ENCOUNTER — Other Ambulatory Visit: Payer: Self-pay | Admitting: Pulmonary Disease

## 2023-09-30 ENCOUNTER — Other Ambulatory Visit (HOSPITAL_COMMUNITY): Payer: Self-pay

## 2023-09-30 MED ORDER — CARVEDILOL 6.25 MG PO TABS
6.2500 mg | ORAL_TABLET | Freq: Two times a day (BID) | ORAL | 3 refills | Status: DC
  Filled 2023-09-30: qty 180, 90d supply, fill #0
  Filled 2023-12-22: qty 180, 90d supply, fill #1
  Filled 2024-03-23: qty 180, 90d supply, fill #2
  Filled 2024-06-19: qty 180, 90d supply, fill #3

## 2023-10-01 ENCOUNTER — Other Ambulatory Visit: Payer: Self-pay

## 2023-10-01 ENCOUNTER — Other Ambulatory Visit: Payer: Self-pay | Admitting: Nurse Practitioner

## 2023-10-01 ENCOUNTER — Other Ambulatory Visit (HOSPITAL_COMMUNITY): Payer: Self-pay

## 2023-10-01 DIAGNOSIS — C50911 Malignant neoplasm of unspecified site of right female breast: Secondary | ICD-10-CM

## 2023-10-01 MED ORDER — NERATINIB MALEATE 40 MG PO TABS
240.0000 mg | ORAL_TABLET | Freq: Every day | ORAL | 0 refills | Status: DC
  Filled 2023-10-01: qty 180, 30d supply, fill #0

## 2023-10-01 NOTE — Progress Notes (Signed)
Specialty Pharmacy Ongoing Clinical Assessment Note  Ashley Robertson is a 67 y.o. female who is being followed by the specialty pharmacy service for RxSp Oncology   Patient's specialty medication(s) reviewed today: [name] Maleate   Missed doses in the last 4 weeks: 0   Patient/Caregiver did not have any additional questions or concerns.   Therapeutic benefit summary: Patient is achieving benefit   Adverse events/side effects summary: Experienced adverse events/side effects (mild, occasional diarrhea, pt has Imodium on hand but has not needed to use)   Patient's therapy is appropriate to: Continue    Goals Addressed             This Visit's Progress    Achieve or maintain remission       Patient is on track. Patient will maintain adherence.  Ashley Robertson is stable at this time.          Follow up:  3 months  Servando Snare Specialty Pharmacist

## 2023-10-01 NOTE — Progress Notes (Signed)
Specialty Pharmacy Refill Coordination Note  Ashley Robertson is a 67 y.o. female contacted today regarding refills of specialty medication(s) [name] Maleate   Patient requested [name] at [facility] at [place] date: 10/09/23   Medication will be filled on 10/08/23.

## 2023-10-02 ENCOUNTER — Telehealth: Payer: Self-pay | Admitting: Pharmacy Technician

## 2023-10-02 ENCOUNTER — Other Ambulatory Visit (HOSPITAL_COMMUNITY): Payer: Self-pay

## 2023-10-02 ENCOUNTER — Encounter: Payer: Self-pay | Admitting: Hematology

## 2023-10-02 NOTE — Telephone Encounter (Signed)
Oral Oncology Patient Advocate Encounter  Was successful in securing patient a $15,000 grant from [facility] to provide copayment coverage for Nerlynx.  This will keep the out of pocket expense at $0.     [employer] ID: [id number]  I have spoken with the patient.   The billing information is as follows and has been shared with [facility].    RxBin: [id number] PCN: [id number] Member ID: [id number] Group ID: [id number] Dates of Eligibility: 09/02/23 through 08/31/24  Fund:  Breast  Jinger Neighbors, CPhT-Adv Oncology Pharmacy Patient Advocate [facility] Direct Number: [phone]  Fax: [phone],

## 2023-10-03 ENCOUNTER — Other Ambulatory Visit (HOSPITAL_COMMUNITY): Payer: Self-pay

## 2023-10-03 MED ORDER — TRELEGY ELLIPTA 200-62.5-25 MCG/ACT IN AEPB
1.0000 | INHALATION_SPRAY | Freq: Every day | RESPIRATORY_TRACT | 2 refills | Status: DC
  Filled 2023-10-03: qty 60, 30d supply, fill #0
  Filled 2023-10-29: qty 60, 30d supply, fill #1
  Filled 2023-11-30: qty 60, 30d supply, fill #2

## 2023-10-07 ENCOUNTER — Other Ambulatory Visit (HOSPITAL_COMMUNITY): Payer: Self-pay

## 2023-10-08 ENCOUNTER — Other Ambulatory Visit: Payer: Self-pay

## 2023-10-11 ENCOUNTER — Other Ambulatory Visit (HOSPITAL_COMMUNITY): Payer: Self-pay

## 2023-10-17 DIAGNOSIS — F3342 Major depressive disorder, recurrent, in full remission: Secondary | ICD-10-CM | POA: Diagnosis not present

## 2023-10-17 DIAGNOSIS — R7309 Other abnormal glucose: Secondary | ICD-10-CM | POA: Diagnosis not present

## 2023-10-17 DIAGNOSIS — I2510 Atherosclerotic heart disease of native coronary artery without angina pectoris: Secondary | ICD-10-CM | POA: Diagnosis not present

## 2023-10-17 DIAGNOSIS — I6529 Occlusion and stenosis of unspecified carotid artery: Secondary | ICD-10-CM | POA: Diagnosis not present

## 2023-10-17 DIAGNOSIS — C50919 Malignant neoplasm of unspecified site of unspecified female breast: Secondary | ICD-10-CM | POA: Diagnosis not present

## 2023-10-17 DIAGNOSIS — E7800 Pure hypercholesterolemia, unspecified: Secondary | ICD-10-CM | POA: Diagnosis not present

## 2023-10-17 DIAGNOSIS — J439 Emphysema, unspecified: Secondary | ICD-10-CM | POA: Diagnosis not present

## 2023-10-17 DIAGNOSIS — Z23 Encounter for immunization: Secondary | ICD-10-CM | POA: Diagnosis not present

## 2023-10-17 DIAGNOSIS — Z1159 Encounter for screening for other viral diseases: Secondary | ICD-10-CM | POA: Diagnosis not present

## 2023-10-17 DIAGNOSIS — M8588 Other specified disorders of bone density and structure, other site: Secondary | ICD-10-CM | POA: Diagnosis not present

## 2023-10-17 DIAGNOSIS — I10 Essential (primary) hypertension: Secondary | ICD-10-CM | POA: Diagnosis not present

## 2023-10-17 DIAGNOSIS — R7303 Prediabetes: Secondary | ICD-10-CM | POA: Diagnosis not present

## 2023-10-17 DIAGNOSIS — I700 Atherosclerosis of aorta: Secondary | ICD-10-CM | POA: Diagnosis not present

## 2023-10-28 ENCOUNTER — Ambulatory Visit: Payer: PPO | Attending: Plastic Surgery

## 2023-10-28 VITALS — Wt 139.4 lb

## 2023-10-28 DIAGNOSIS — Z483 Aftercare following surgery for neoplasm: Secondary | ICD-10-CM | POA: Insufficient documentation

## 2023-10-28 NOTE — Therapy (Signed)
OUTPATIENT PHYSICAL THERAPY SOZO SCREENING NOTE   Patient Name: Ashley Robertson MRN: [id number] DOB:1956/01/10, 67 y.o., female Today's Date: 10/28/2023  PCP: Gweneth Dimitri, MD REFERRING PROVIDER: Glenna Fellows, MD   PT End of Session - 10/28/23 [phone]     Visit Number 2   # unchanged due to screen only   PT Start Time 0909             Past Medical History:  Diagnosis Date   Allergy    Lisinopril and lipitor   Anxiety and depression    Aortic atherosclerosis (HCC) 03/2020   CT Chest: 2 V (LAD & LCx) Coronary Atherosclerosis, Aortic Atherosclerosis (no aneurysm).  Mild centrilobular emphysema with mild diffuse bronchial thickening; several scattered small solitary pulmonary nodules (largest 5.6 mm in anterior left upper lobe)   Breast cancer (HCC) 12/2021   Right breast ILC   Carotid artery plaque, bilateral 10/2015   Mild to moderate plaque L>R without significant stenosis   COPD (chronic obstructive pulmonary disease) (HCC)    Coronary Artery Calcification - Score 79    Coronary Calcium Score 79.  LAD and circumflex calcification noted.  Normal ascending aorta with mild calcification.   Current every day smoker    pt quit smoking April 2023   Emphysema lung [facility])    Noted on chest CT   Family history of breast cancer 02/21/2022   Family history of prostate cancer 02/21/2022   Hyperlipidemia    Hypertension    Controlled with amlodipine   Prediabetes    Skin cancer 2021   Remove 2022   Past Surgical History:  Procedure Laterality Date   ABDOMINAL AORTIC ANEURYSM REPAIR N/A 01/11/2023   Procedure: ANEURYSM ABDOMINAL AORTIC REPAIR;  Surgeon: Nada Libman, MD;  Location: [facility] OR;  Service: Vascular;  Laterality: N/A;   BREAST BIOPSY Right    times 3   BREAST RECONSTRUCTION WITH PLACEMENT OF TISSUE EXPANDER AND ALLODERM Right 07/26/2022   Procedure: RIGHT BREAST RECONSTRUCTION WITH PLACEMENT OF TISSUE EXPANDER AND ALLODERM;  Surgeon: Glenna Fellows, MD;   Location: [facility] OR;  Service: Plastics;  Laterality: Right;   CESAREAN SECTION  1990, 1991   COSMETIC SURGERY  1981   Rhinoplasty   MASTECTOMY Right    MASTECTOMY W/ SENTINEL NODE BIOPSY Right 07/26/2022   Procedure: RIGHT MASTECTOMY, RIGHT AXILLARY SENTINEL NODE BIOPSY;  Surgeon: Emelia Loron, MD;  Location: [facility] OR;  Service: General;  Laterality: Right;  GEN & PEC BLOCK   PORTACATH PLACEMENT Left 03/06/2022   Procedure: INSERTION PORT-A-CATH;  Surgeon: Emelia Loron, MD;  Location: [facility];  Service: General;  Laterality: Left;   RADIOACTIVE SEED GUIDED AXILLARY SENTINEL LYMPH NODE Right 07/26/2022   Procedure: RADIOACTIVE SEED GUIDED AXILLARY SENTINEL LYMPH NODE DISSECTION;  Surgeon: Emelia Loron, MD;  Location: [facility] OR;  Service: General;  Laterality: Right;   RHINOPLASTY  1981   TONSILLECTOMY  1978   TRANSTHORACIC ECHOCARDIOGRAM  10/2016   EF 55 to 60%.  Normal systolic and diastolic function.  No ASD/PFO   TUBAL LIGATION  1991   Patient Active Problem List   Diagnosis Date Noted   AAA (abdominal aortic aneurysm) (HCC) 01/11/2023   S/P AAA repair 01/11/2023   Osteopenia 10/21/2022   Aortic atherosclerosis (HCC) 08/10/2022   S/P mastectomy, right 07/26/2022   Carotid atherosclerosis 03/28/2022   Coronary artery disease 03/28/2022   Pulmonary emphysema (HCC) 03/28/2022   Recurrent major depression in remission (HCC) 03/28/2022   Port-A-Cath in place 03/27/2022  Genetic testing 03/12/2022   Family history of breast cancer 02/21/2022   Family history of prostate cancer 02/21/2022   Hypercholesterolemia 02/21/2022   Malignant neoplasm of overlapping sites of right breast in female, estrogen receptor positive (HCC) 02/16/2022   Bilateral carotid bruits 04/26/2020   Essential hypertension 03/19/2020   Hyperlipidemia with target LDL less than 100 03/19/2020   Prediabetes 03/19/2020   Agatston CAC score, <100 03/15/2020    REFERRING DIAG: Rt breast  cancer at risk for lymphedema  THERAPY DIAG: Aftercare following surgery for neoplasm  PERTINENT HISTORY: Patient was diagnosed on 02/08/2022 with right grade II invasive lobular carcinoma breast cancer. It measured 2.6 cm and 3.3 cm (2 masses) and is located in the upper outer quadrant. It is ER positive, PR negative, and HER2 positive with a Ki67 of 10%. Patient has COPD and hypertension and a 5cm AAA. She is-s/p right mastectomy  with SLNB 07/26/2022 by Dr. Dwain Sarna and reconstruction by Dr. Leta Baptist. Path showed 9.5 cm invasive lobular carcinoma involving dermis of nipple, margins negative, 2/5 positive lymph nodes. -given the significant residual disease, the recommendation is for postmastectomy radiation and switch to adjuvant Kadcyla  PRECAUTIONS: right UE Lymphedema risk, None  SUBJECTIVE: Pt returns for her 3 month L-Dex screen.   PAIN:  Are you having pain? No  SOZO SCREENING: Patient was assessed today using the SOZO machine to determine the lymphedema index score. This was compared to her baseline score. It was determined that she is within the recommended range when compared to her baseline and no further action is needed at this time. She will continue SOZO screenings. These are done every 3 months for 2 years post operatively followed by every 6 months for 2 years, and then annually.  Issued info to get measured for a compression sleeve.   L-DEX FLOWSHEETS - 10/28/23 0900       L-DEX LYMPHEDEMA SCREENING   Measurement Type Unilateral    L-DEX MEASUREMENT EXTREMITY Upper Extremity    POSITION  Standing    DOMINANT SIDE Right    At Risk Side Right    BASELINE SCORE (UNILATERAL) -0.2    L-DEX SCORE (UNILATERAL) 0.7    VALUE CHANGE (UNILAT) 0.9               Ashley Robertson, PTA 10/28/2023, 9:13 AM

## 2023-10-29 ENCOUNTER — Other Ambulatory Visit: Payer: Self-pay | Admitting: Nurse Practitioner

## 2023-10-29 ENCOUNTER — Other Ambulatory Visit: Payer: Self-pay

## 2023-10-29 DIAGNOSIS — C50911 Malignant neoplasm of unspecified site of right female breast: Secondary | ICD-10-CM

## 2023-10-29 NOTE — Progress Notes (Signed)
Specialty Pharmacy Refill Coordination Note  Ashley Robertson is a 67 y.o. female contacted today regarding refills of specialty medication(s) [name] Maleate   Patient requested [name] at [facility] at [place] date: 11/06/23   Medication will be filled on 11/05/23.

## 2023-10-30 ENCOUNTER — Encounter: Payer: Self-pay | Admitting: Hematology

## 2023-10-30 ENCOUNTER — Other Ambulatory Visit: Payer: Self-pay

## 2023-10-30 MED ORDER — NERATINIB MALEATE 40 MG PO TABS
240.0000 mg | ORAL_TABLET | Freq: Every day | ORAL | 0 refills | Status: DC
  Filled 2023-10-30: qty 180, 30d supply, fill #0

## 2023-11-04 ENCOUNTER — Other Ambulatory Visit (HOSPITAL_COMMUNITY): Payer: Self-pay

## 2023-11-06 ENCOUNTER — Other Ambulatory Visit: Payer: Self-pay

## 2023-11-08 ENCOUNTER — Encounter: Payer: Self-pay | Admitting: Hematology

## 2023-11-08 ENCOUNTER — Other Ambulatory Visit (HOSPITAL_COMMUNITY): Payer: Self-pay

## 2023-11-11 NOTE — Assessment & Plan Note (Signed)
invasive lobular carcinoma, Stage IIIA, c(T3, N1), yp(T3, N1a), ER+/PR-/HER2+, Grade 2  -Diagnosed in 01/2022 -s/p neoadjuvant chemo TCHP X6, followed by right mastectomy by Dr. Dwain Sarna and reconstruction by Dr. Leta Baptist. Unfortunately she did not have much response to neoadjvuant chemo  -on adjuvant Kadcyla now, tolerating well with no noticeable side effects. Plan for a total of 14 treatment -she received postmastectomy radiation under Dr. Mitzi Hansen, 10/10 - 10/29/22. -she started adjuvant anastrozole in January 2024, she is tolerating very well -Echocardiogram in January 2024 showed slightly decreased EF 50 to 55%.  She was seen by cardiologist after echo, her cardiac medication was adjusted, and we will continue HER2 antibody.  repeated echo on 03/18/23 showed normal EF 55%. She finished Kadcyla in 06/2023

## 2023-11-12 ENCOUNTER — Other Ambulatory Visit: Payer: Self-pay

## 2023-11-12 ENCOUNTER — Inpatient Hospital Stay: Payer: PPO | Attending: Hematology

## 2023-11-12 ENCOUNTER — Encounter: Payer: Self-pay | Admitting: Hematology

## 2023-11-12 ENCOUNTER — Inpatient Hospital Stay: Payer: PPO | Admitting: Hematology

## 2023-11-12 VITALS — BP 122/50 | HR 62 | Temp 98.7°F | Resp 13 | Wt 143.0 lb

## 2023-11-12 DIAGNOSIS — C50811 Malignant neoplasm of overlapping sites of right female breast: Secondary | ICD-10-CM | POA: Diagnosis not present

## 2023-11-12 DIAGNOSIS — Z7983 Long term (current) use of bisphosphonates: Secondary | ICD-10-CM | POA: Insufficient documentation

## 2023-11-12 DIAGNOSIS — Z79899 Other long term (current) drug therapy: Secondary | ICD-10-CM | POA: Diagnosis not present

## 2023-11-12 DIAGNOSIS — Z79811 Long term (current) use of aromatase inhibitors: Secondary | ICD-10-CM | POA: Insufficient documentation

## 2023-11-12 DIAGNOSIS — C773 Secondary and unspecified malignant neoplasm of axilla and upper limb lymph nodes: Secondary | ICD-10-CM | POA: Diagnosis not present

## 2023-11-12 DIAGNOSIS — C50911 Malignant neoplasm of unspecified site of right female breast: Secondary | ICD-10-CM

## 2023-11-12 DIAGNOSIS — Z170 Estrogen receptor positive status [ER+]: Secondary | ICD-10-CM | POA: Diagnosis not present

## 2023-11-12 DIAGNOSIS — Z9011 Acquired absence of right breast and nipple: Secondary | ICD-10-CM | POA: Insufficient documentation

## 2023-11-12 DIAGNOSIS — Z9221 Personal history of antineoplastic chemotherapy: Secondary | ICD-10-CM | POA: Diagnosis not present

## 2023-11-12 DIAGNOSIS — Z7982 Long term (current) use of aspirin: Secondary | ICD-10-CM | POA: Diagnosis not present

## 2023-11-12 DIAGNOSIS — D649 Anemia, unspecified: Secondary | ICD-10-CM | POA: Insufficient documentation

## 2023-11-12 DIAGNOSIS — Z95828 Presence of other vascular implants and grafts: Secondary | ICD-10-CM

## 2023-11-12 LAB — CBC WITH DIFFERENTIAL (CANCER CENTER ONLY)
Abs Immature Granulocytes: 0.01 10*3/uL (ref 0.00–0.07)
Basophils Absolute: 0 10*3/uL (ref 0.0–0.1)
Basophils Relative: 1 %
Eosinophils Absolute: 0.2 10*3/uL (ref 0.0–0.5)
Eosinophils Relative: 6 %
HCT: 27.7 % — ABNORMAL LOW (ref 36.0–46.0)
Hemoglobin: 8.5 g/dL — ABNORMAL LOW (ref 12.0–15.0)
Immature Granulocytes: 0 %
Lymphocytes Relative: 23 %
Lymphs Abs: 0.8 10*3/uL (ref 0.7–4.0)
MCH: 25.8 pg — ABNORMAL LOW (ref 26.0–34.0)
MCHC: 30.7 g/dL (ref 30.0–36.0)
MCV: 83.9 fL (ref 80.0–100.0)
Monocytes Absolute: 0.4 10*3/uL (ref 0.1–1.0)
Monocytes Relative: 11 %
Neutro Abs: 2 10*3/uL (ref 1.7–7.7)
Neutrophils Relative %: 59 %
Platelet Count: 135 10*3/uL — ABNORMAL LOW (ref 150–400)
RBC: 3.3 MIL/uL — ABNORMAL LOW (ref 3.87–5.11)
RDW: 16.3 % — ABNORMAL HIGH (ref 11.5–15.5)
WBC Count: 3.4 10*3/uL — ABNORMAL LOW (ref 4.0–10.5)
nRBC: 0 % (ref 0.0–0.2)

## 2023-11-12 LAB — CMP (CANCER CENTER ONLY)
ALT: 20 U/L (ref 0–44)
AST: 35 U/L (ref 15–41)
Albumin: 3.4 g/dL — ABNORMAL LOW (ref 3.5–5.0)
Alkaline Phosphatase: 169 U/L — ABNORMAL HIGH (ref 38–126)
Anion gap: 5 (ref 5–15)
BUN: 14 mg/dL (ref 8–23)
CO2: 27 mmol/L (ref 22–32)
Calcium: 8.7 mg/dL — ABNORMAL LOW (ref 8.9–10.3)
Chloride: 107 mmol/L (ref 98–111)
Creatinine: 0.75 mg/dL (ref 0.44–1.00)
GFR, Estimated: >60 mL/min (ref >60)
Glucose, Bld: 108 mg/dL — ABNORMAL HIGH (ref 70–99)
Potassium: 4.4 mmol/L (ref 3.5–5.1)
Sodium: 139 mmol/L (ref 135–145)
Total Bilirubin: 0.4 mg/dL (ref <1.2)
Total Protein: 6.7 g/dL (ref 6.5–8.1)

## 2023-11-12 LAB — FERRITIN: Ferritin: 13 ng/mL (ref 11–307)

## 2023-11-12 LAB — RETIC PANEL
Immature Retic Fract: 21.1 % — ABNORMAL HIGH (ref 2.3–15.9)
RBC.: 3.42 MIL/uL — ABNORMAL LOW (ref 3.87–5.11)
Retic Count, Absolute: 55.1 10*3/uL (ref 19.0–186.0)
Retic Ct Pct: 1.6 % (ref 0.4–3.1)
Reticulocyte Hemoglobin: 23.3 pg — ABNORMAL LOW (ref >27.9)

## 2023-11-12 LAB — VITAMIN B12: Vitamin B-12: 352 pg/mL (ref 180–914)

## 2023-11-12 LAB — GENETIC SCREENING ORDER

## 2023-11-12 MED ORDER — HEPARIN SOD (PORK) LOCK FLUSH 100 UNIT/ML IV SOLN
500.0000 [IU] | Freq: Once | INTRAVENOUS | Status: AC
  Administered 2023-11-12: 500 [IU]

## 2023-11-12 MED ORDER — SODIUM CHLORIDE 0.9% FLUSH
10.0000 mL | Freq: Once | INTRAVENOUS | Status: AC
  Administered 2023-11-12: 10 mL

## 2023-11-12 NOTE — Progress Notes (Signed)
Signatera ordered and emailed to [employer]  Pt had Signatera lab drawn today in clinic with anemia labs.

## 2023-11-12 NOTE — Progress Notes (Signed)
[facility]   [phone] [phone]   Clinic Follow up Note   Patient Care Team: Gweneth Dimitri, MD as PCP - General (Family Medicine) Marykay Lex, MD as PCP - Cardiology (Cardiology) Pershing Proud, RN as Oncology Nurse Navigator Donnelly Angelica, RN as Oncology Nurse Navigator Emelia Loron, MD as Consulting Physician (General Surgery) Malachy Mood, MD as Consulting Physician (Hematology) Dorothy Puffer, MD as Consulting Physician (Radiation Oncology)  Date of Service:  11/12/2023  CHIEF COMPLAINT: f/u of breast cancer  CURRENT THERAPY:  Anastrozole and neratinib  Oncology History   Malignant neoplasm of overlapping sites of right breast in female, estrogen receptor positive (HCC) invasive lobular carcinoma, Stage IIIA, c(T3, N1), yp(T3, N1a), ER+/PR-/HER2+, Grade 2  -Diagnosed in 01/2022 -s/p neoadjuvant chemo TCHP X6, followed by right mastectomy by Dr. Dwain Sarna and reconstruction by Dr. Leta Baptist. Unfortunately she did not have much response to neoadjvuant chemo  -on adjuvant Kadcyla now, tolerating well with no noticeable side effects. Plan for a total of 14 treatment -she received postmastectomy radiation under Dr. Mitzi Hansen, 10/10 - 10/29/22. -she started adjuvant anastrozole in January 2024, she is tolerating very well -Echocardiogram in January 2024 showed slightly decreased EF 50 to 55%.  She was seen by cardiologist after echo, her cardiac medication was adjusted, and we will continue HER2 antibody.  repeated echo on 03/18/23 showed normal EF 55%. She finished Kadcyla in 06/2023   Assessment and Plan    Breast Cancer Follow-up for breast cancer. Last CT scan in January 2024 was unremarkable. Mammogram scheduled for December 15, 2023. Reconstruction surgery scheduled for December 20, 2023. Currently on anastrozole and Nerlynx (neratinib) without significant side effects. Discussed potential for Nerlynx to cause brittle nails. No routine  scans recommended unless clinical suspicion arises. Discussed Signatera blood test for early recurrence detection. Positive Signatera test indicates >90% likelihood of recurrence and necessitates a whole-body scan before changing treatment. Negative results indicate a much lower chance of recurrence. - Perform Signatera blood test every 3-6 months - Continue anastrozole - Continue Nerlynx (6 tablets daily) - Proceed with mammogram on December 15, 2023 - Proceed with reconstruction surgery on December 20, 2023 - Remove port during surgery if Signatera negative  Anemia Hemoglobin 8.5, dropped from 9.8 two months ago.. No active bleeding reported. Symptoms include fatigue and shortness of breath on exertion. Plan to investigate the cause of anemia with further lab work. Discussed the use of prenatal vitamins to help with anemia. - Order anemia workup including iron level, B12, and folate - Recommend over-the-counter prenatal vitamins - Follow up with lab results and consider additional tests if needed - Schedule follow-up visit in 4-6 weeks to check blood counts  Osteoporosis Osteopenia and wrist fracture. Currently on Fosamax. Discussed potential bone-weakening effects of anastrozole. Prefers to continue oral Fosamax over infusion therapy. Advised on dental care due to potential side effects of Fosamax. - Continue Fosamax - Ensure adequate calcium and vitamin D intake - Monitor bone density every 2 years - Advise dental care and inform dentist about Fosamax use  Chronic Obstructive Pulmonary Disease (COPD) Follow-up with pulmonologist scheduled for next year. No recent CT scan recommended by pulmonologist. Continue current inhaler therapy. - Continue current inhaler therapy - Follow up with pulmonologist next year   Plan -Continue anastrozole and neratinib - Schedule follow-up visit in 3 months - Order anemia workup today and follow up with results - Schedule Signatera blood test and  follow up with results  SUMMARY OF ONCOLOGIC HISTORY: Oncology History Overview Note   Cancer Staging  Malignant neoplasm of overlapping sites of right breast in female, estrogen receptor positive (HCC) Staging form: Breast, AJCC 8th Edition - Clinical stage from 02/09/2022: Stage IIA (cT2, cN1, cM0, G2, ER+, PR-, HER2+) - Signed by Malachy Mood, MD on 02/20/2022    Malignant neoplasm of overlapping sites of right breast in female, estrogen receptor positive (HCC)  02/08/2022 Mammogram   CLINICAL DATA:  Acute onset right breast lump.  EXAM: DIGITAL DIAGNOSTIC UNILATERAL RIGHT MAMMOGRAM WITH TOMOSYNTHESIS AND CAD; ULTRASOUND RIGHT BREAST LIMITED  IMPRESSION: 1. Highly suspicious findings mammographically and sonographically. I suspect there is significant malignancy throughout most of the right breast. Discrete masses are seen at 11 o'clock and 6 o'clock.  A single borderline lymph node is identified. This lymph node appears prominent compared to the remainder of the lymph nodes. The skin thickening suggests the possibility of inflammatory breast cancer.   02/09/2022 Cancer Staging   Staging form: Breast, AJCC 8th Edition - Clinical stage from 02/09/2022: Stage IIIA (cT3, cN1, cM0, G2, ER+, PR-, HER2+) - Signed by Malachy Mood, MD on 03/05/2022 Stage prefix: Initial diagnosis Histologic grading system: 3 grade system   02/09/2022 Initial Biopsy   Diagnosis 1. Breast, right, needle core biopsy, right breast 11 o'clock mass ribbon clip - INVASIVE MAMMARY CARCINOMA - SEE COMMENT 2. Breast, right, needle core biopsy, right breast 6'oclock mass, coil clip - INVASIVE MAMMARY CARCINOMA - SEE COMMENT 3. Lymph node, needle/core biopsy, right axillary lymph node, tribell clip - METASTATIC CARCINOMA INVOLVING NODAL TISSUE Microscopic Comment 1. and 2. The biopsy material shows an infiltrative proliferation of cells arranged linearly and in small clusters. Based on the biopsy, the carcinoma  appears Nottingham grade 2 of 3 and measures 1.5 cm in greatest linear extent.  Addendum parts 1 and 2: Immunohistochemistry for E-cadherin is negative consistent with lobular carcinoma.  2. PROGNOSTIC INDICATORS Results: The tumor cells are EQUIVOCAL for Her2 (2+). Her2 by FISH will be performed and the results reported separately. Estrogen Receptor: 100%, POSITIVE, STRONG STAINING INTENSITY Progesterone Receptor: <1%, NEGATIVE Proliferation Marker Ki67: 10%  2. FLUORESCENCE IN-SITU HYBRIDIZATION Results: GROUP 1: HER2 **POSITIVE**   3. PROGNOSTIC INDICATORS Results: By immunohistochemistry, the tumor cells are POSITIVE for Her2 (3+). Estrogen Receptor: 100%, POSITIVE, STRONG STAINING INTENSITY Progesterone Receptor: <1%, NEGATIVE   02/16/2022 Initial Diagnosis   Malignant neoplasm of overlapping sites of right breast in female, estrogen receptor positive (HCC)   03/01/2022 Imaging   EXAM: BILATERAL BREAST MRI WITH AND WITHOUT CONTRAST  IMPRESSION: 1. 8.5 x 8.3 x 5.9 cm area of confluent mass-like enhancement in the right breast involving all 4 quadrants and containing 2 biopsy marker clip artifacts, compatible with 4 quadrant biopsy-proven malignancy. 2. Biopsy-proven metastatic lymph node in the right axilla. 3. Possible metastatic intramammary lymph node in the posterior outer right breast just above the level of the nipple. 4. No evidence of malignancy on the left.   03/01/2022 Genetic Testing   Negative hereditary cancer genetic testing: no pathogenic variants detected in [employer] CustomNext-Cancer +RNAinsight Panel.  Variant of uncertain significance reported in BRIP1 at p.F934V (c.2800T>G). Report date is March 01, 2022.    The CustomNext-Cancer+RNAinsight panel offered by [employer] includes sequencing and rearrangement analysis for the following 47 genes:  APC, ATM, AXIN2, BARD1, BMPR1A, BRCA1, BRCA2, BRIP1, CDH1, CDK4, CDKN2A, CHEK2, DICER1, EPCAM, GREM1, HOXB13,  MEN1, MLH1, MSH2, MSH3, MSH6, MUTYH, NBN, NF1, NF2, NTHL1, PALB2, PMS2, POLD1, POLE, PTEN,  RAD51C, RAD51D, RECQL, RET, SDHA, SDHAF2, SDHB, SDHC, SDHD, SMAD4, SMARCA4, STK11, TP53, TSC1, TSC2, and VHL.  RNA data is routinely analyzed for use in variant interpretation for all genes.  UPDATE: BRIP1 p.F934V VUS has been amended to Likely Benign. Amended report date is 11/06/2022.    03/05/2022 Imaging   EXAM: CT CHEST, ABDOMEN, AND PELVIS WITH CONTRAST  IMPRESSION: 1. Asymmetric, masslike density of the glandular tissue of the right breast with overlying skin thickening, in keeping with known primary breast malignancy. 2. Enlarged, ill-defined right axillary lymph node containing a biopsy marking clip, consistent with known nodal metastatic disease. 3. No other evidence of lymphadenopathy or metastatic disease in the chest, abdomen, or pelvis. 4. Emphysema. Background of fine centrilobular nodularity, most concentrated in the lung apices, consistent with smoking-related respiratory bronchiolitis. 5. Aneurysm of the infrarenal abdominal aorta measuring up to 5.4 x 5.3 cm with a large burden of eccentric mural thrombus. Recommend follow-up CT/MR every 6 months and vascular consultation. This recommendation follows ACR consensus guidelines: White Paper of the ACR Incidental Findings Committee II on Vascular Findings. J Am Coll Radiol 2013; 10:789-794. 6. Coronary artery disease.   03/05/2022 Imaging   EXAM: NUCLEAR MEDICINE WHOLE BODY BONE SCAN  IMPRESSION: No evidence of bony metastatic disease.   03/07/2022 - 08/01/2022 Chemotherapy   Patient is on Treatment Plan : BREAST  Docetaxel + Carboplatin + Trastuzumab + Pertuzumab  (TCHP) q21d      07/26/2022 Surgery   Right mastectomy with axillary node seed guided excision converted to axillary node dissection with reconstruction by Drs. Dwain Sarna and Thimmappa   07/26/2022 Pathology Results   FINAL MICROSCOPIC DIAGNOSIS:   A. LYMPH NODE, RIGHT  AXILLARY TARGETED, EXCISION:  - Metastatic carcinoma involving one lymph node, 2 cm (1/1).  - Focal extranodal extension.  - Biopsy site and biopsy clip.   B. BREAST, RIGHT, MASTECTOMY:  - Invasive lobular carcinoma, 9.5 cm (ypT3).  - Carcinoma involves dermis of nipple.  - All surgical margins negative for carcinoma.  - One lymph node negative for metastatic carcinoma (0/1).  - Biopsy sites and biopsy clips.  - See oncology table.   C. LYMPH NODE, RIGHT AXILLARY, SENTINEL, EXCISION:  - One lymph node negative for metastatic carcinoma (0/1).   D. LYMPH NODE, RIGHT AXILLARY, SENTINEL, EXCISION:  - Metastatic carcinoma in one lymph node, 0.5 cm. (1/1).   E. LYMPH NODE, RIGHT AXILLARY, SENTINEL, EXCISION:  - One lymph node negative for metastatic carcinoma (0/1).    07/26/2022 Cancer Staging   Cancer Staging  Malignant neoplasm of overlapping sites of right breast in female, estrogen receptor positive (HCC) Staging form: Breast, AJCC 8th Edition - Clinical stage from 02/09/2022: Stage IIIA (cT3, cN1, cM0, G2, ER+, PR-, HER2+) - Signed by Malachy Mood, MD on 03/05/2022 Stage prefix: Initial diagnosis Histologic grading system: 3 grade system - Pathologic stage from 07/26/2022: Stage IIIA (pT3, pN1a, cM0, G2, ER+, PR-, HER2+) - Signed by Pollyann Samples, NP on 08/01/2022 Histologic grading system: 3 grade system    07/26/2022 Cancer Staging   Staging form: Breast, AJCC 8th Edition - Pathologic stage from 07/26/2022: Stage IIIA (pT3, pN1a, cM0, G2, ER+, PR-, HER2+) - Signed by Pollyann Samples, NP on 08/01/2022 Histologic grading system: 3 grade system   08/24/2022 -  Chemotherapy   Patient is on Treatment Plan : BREAST ADO-Trastuzumab Emtansine (Kadcyla) q21d     07/24/2023 Imaging   Echocardiogram  Normal LVF at 55-60 % Normal left diastolic function. Normal right  ventricle  Normal visualized valves with preserved function      Discussed the use of AI scribe software for clinical note  transcription with the patient, who gave verbal consent to proceed.  History of Present Illness   The patient, a breast cancer survivor with COPD and osteoporosis, presents for a follow-up visit. She reports that her pulmonologist had inquired about the schedule for her follow-up scans. She is scheduled for a mammogram and reconstructive surgery in January. She also mentions a previous wrist fracture and is currently on Fosamax for osteoporosis. She is also taking anastrozole and neratinib for her breast cancer. She reports no significant side effects from her medications, except for occasional diarrhea from neratinib, which is managed with Imodium. She also mentions that her nails have become brittle since starting neratinib. She reports no bleeding but does experience fatigue, which she attributes to caring for her granddaughter. She also reports shortness of breath when exerting herself, such as when climbing stairs.         All other systems were reviewed with the patient and are negative.  MEDICAL HISTORY:  Past Medical History:  Diagnosis Date   Allergy    Lisinopril and lipitor   Anxiety and depression    Aortic atherosclerosis (HCC) 03/2020   CT Chest: 2 V (LAD & LCx) Coronary Atherosclerosis, Aortic Atherosclerosis (no aneurysm).  Mild centrilobular emphysema with mild diffuse bronchial thickening; several scattered small solitary pulmonary nodules (largest 5.6 mm in anterior left upper lobe)   Breast cancer (HCC) 12/2021   Right breast ILC   Carotid artery plaque, bilateral 10/2015   Mild to moderate plaque L>R without significant stenosis   COPD (chronic obstructive pulmonary disease) (HCC)    Coronary Artery Calcification - Score 79    Coronary Calcium Score 79.  LAD and circumflex calcification noted.  Normal ascending aorta with mild calcification.   Current every day smoker    pt quit smoking April 2023   Emphysema lung [facility])    Noted on chest CT   Family history of breast  cancer 02/21/2022   Family history of prostate cancer 02/21/2022   Hyperlipidemia    Hypertension    Controlled with amlodipine   Prediabetes    Skin cancer 2021   Remove 2022    SURGICAL HISTORY: Past Surgical History:  Procedure Laterality Date   ABDOMINAL AORTIC ANEURYSM REPAIR N/A 01/11/2023   Procedure: ANEURYSM ABDOMINAL AORTIC REPAIR;  Surgeon: Nada Libman, MD;  Location: [facility] OR;  Service: Vascular;  Laterality: N/A;   BREAST BIOPSY Right    times 3   BREAST RECONSTRUCTION WITH PLACEMENT OF TISSUE EXPANDER AND ALLODERM Right 07/26/2022   Procedure: RIGHT BREAST RECONSTRUCTION WITH PLACEMENT OF TISSUE EXPANDER AND ALLODERM;  Surgeon: Glenna Fellows, MD;  Location: [facility] OR;  Service: Plastics;  Laterality: Right;   CESAREAN SECTION  1990, 1991   COSMETIC SURGERY  1981   Rhinoplasty   MASTECTOMY Right    MASTECTOMY W/ SENTINEL NODE BIOPSY Right 07/26/2022   Procedure: RIGHT MASTECTOMY, RIGHT AXILLARY SENTINEL NODE BIOPSY;  Surgeon: Emelia Loron, MD;  Location: [facility] OR;  Service: General;  Laterality: Right;  GEN & PEC BLOCK   PORTACATH PLACEMENT Left 03/06/2022   Procedure: INSERTION PORT-A-CATH;  Surgeon: Emelia Loron, MD;  Location: [facility];  Service: General;  Laterality: Left;   RADIOACTIVE SEED GUIDED AXILLARY SENTINEL LYMPH NODE Right 07/26/2022   Procedure: RADIOACTIVE SEED GUIDED AXILLARY SENTINEL LYMPH NODE DISSECTION;  Surgeon: Emelia Loron, MD;  Location: [facility] OR;  Service: General;  Laterality: Right;   RHINOPLASTY  1981   TONSILLECTOMY  1978   TRANSTHORACIC ECHOCARDIOGRAM  10/2016   EF 55 to 60%.  Normal systolic and diastolic function.  No ASD/PFO   TUBAL LIGATION  1991    I have reviewed the social history and family history with the patient and they are unchanged from previous note.  ALLERGIES:  is allergic to lisinopril and lipitor [atorvastatin].  MEDICATIONS:  Current Outpatient Medications  Medication Sig Dispense  Refill   albuterol (PROVENTIL HFA) 108 (90 Base) MCG/ACT inhaler Inhale 1 to 2 puffs into the lungs every 6 hours as needed (Patient not taking: Reported on 09/26/2023) 8.5 g 2   alendronate (FOSAMAX) 70 MG tablet Take 1 tablet by mouth once weekly --Take 30 minutes before the first food, beverage or medicine of the day with plain water 12 tablet 3   amLODipine (NORVASC) 10 MG tablet Take 1 tablet (10 mg total) by mouth at bedtime. 90 tablet 1   anastrozole (ARIMIDEX) 1 MG tablet Take 1 tablet (1 mg) by mouth daily. 90 tablet 3   aspirin 81 MG tablet Take 81 mg by mouth at bedtime.     Budesonide ER 9 MG TB24 Take 1 tablet (9 mg total) by mouth daily. Start when you start [name], for one to two months (Patient not taking: Reported on 09/26/2023) 30 tablet 1   Calcium Carb-Cholecalciferol (CALCIUM 500 + D3 PO) Take 1 tablet by mouth daily.     carvedilol (COREG) 6.25 MG tablet Take 1 tablet (6.25 mg total) by mouth 2 (two) times daily. 180 tablet 3   Cholecalciferol (VITAMIN D) 50 MCG (2000 UT) CAPS Take 1 capsule by mouth daily. 30 capsule 2   ezetimibe (ZETIA) 10 MG tablet Take 1 tablet (10 mg total) by mouth daily. 90 tablet 3   FLUoxetine (PROZAC) 20 MG capsule Take 1 capsule (20 mg total) by mouth daily. 90 capsule 3   Fluticasone-Umeclidin-Vilant (TRELEGY ELLIPTA) 200-62.5-25 MCG/ACT AEPB Inhale 1 puff into the lungs daily. 60 each 2   [name] Maleate (NERLYNX) 40 MG tablet Take 6 tablets (240 mg total) by mouth daily. Take with food. 180 tablet 0   OVER THE COUNTER MEDICATION Take 1 capsule by mouth daily. Serrapeptase 120,000 units daily  Reported on 12/27/2015     rosuvastatin (CRESTOR) 5 MG tablet Take 1 tablet by mouth daily at bedtime ,except on Monday, Wednesday and Fridays take 2 tablets at bedtime as directed 180 tablet 3   scopolamine (TRANSDERM-SCOP) 1 MG/3DAYS Place 1 patch (1.5 mg total) onto the skin behind ear 4 (four) hours before getting on boat. Change every 3 days as  needed. (Patient not taking: Reported on 09/26/2023) 4 patch 0   Specialty Vitamins Products (MAGNESIUM, AMINO ACID CHELATE,) 133 MG tablet Take 1 tablet by mouth at bedtime.     triamcinolone (KENALOG) 0.025 % cream Apply 2 (two) times a day to rash on face for 1 week; not safe for long term use 15 g 0   No current facility-administered medications for this visit.    PHYSICAL EXAMINATION: ECOG PERFORMANCE STATUS: 0 - Asymptomatic  Vitals:   11/12/23 0840  BP: (!) 122/50  Pulse: 62  Resp: 13  Temp: 98.7 F (37.1 C)  SpO2: 97%   Wt Readings from Last 3 Encounters:  11/12/23 143 lb (64.9 kg)  10/28/23 139 lb 6 oz (63.2 kg)  09/26/23 137 lb (62.1 kg)  GENERAL:alert, no distress and comfortable SKIN: skin color, texture, turgor are normal, no rashes or significant lesions EYES: normal, Conjunctiva are pink and non-injected, sclera clear NECK: supple, thyroid normal size, non-tender, without nodularity LYMPH:  no palpable lymphadenopathy in the cervical, axillary  LUNGS: clear to auscultation and percussion with normal breathing effort HEART: regular rate & rhythm and no murmurs and no lower extremity edema ABDOMEN:abdomen soft, non-tender and normal bowel sounds Musculoskeletal:no cyanosis of digits and no clubbing  NEURO: alert & oriented x 3 with fluent speech, no focal motor/sensory deficits Breast: Status post right mastectomy and tissue expander in place, left breast exam was negative, no palpable mass or adenopathy.  LABORATORY DATA:  I have reviewed the data as listed    Latest Ref Rng & Units 11/12/2023    8:24 AM 09/18/2023    8:21 AM 08/14/2023    7:56 AM  CBC  WBC 4.0 - 10.5 K/uL 3.4  3.8  5.1   Hemoglobin 12.0 - 15.0 g/dL 8.5  9.8  16.1   Hematocrit 36.0 - 46.0 % 27.7  30.5  34.1   Platelets 150 - 400 K/uL 135  128  102         Latest Ref Rng & Units 11/12/2023    8:24 AM 09/18/2023    8:21 AM 08/14/2023    7:56 AM  CMP  Glucose 70 - 99 mg/dL 096  98   045   BUN 8 - 23 mg/dL 14  17  17    Creatinine 0.44 - 1.00 mg/dL 4.09  8.11  9.14   Sodium 135 - 145 mmol/L 139  137  142   Potassium 3.5 - 5.1 mmol/L 4.4  4.2  4.0   Chloride 98 - 111 mmol/L 107  103  108   CO2 22 - 32 mmol/L 27  26  31    Calcium 8.9 - 10.3 mg/dL 8.7  8.7  8.7   Total Protein 6.5 - 8.1 g/dL 6.7  6.4  6.2   Total Bilirubin <1.2 mg/dL 0.4  0.7  0.5   Alkaline Phos 38 - 126 U/L 169  113  134   AST 15 - 41 U/L 35  32  34   ALT 0 - 44 U/L 20  27  26        RADIOGRAPHIC STUDIES: I have personally reviewed the radiological images as listed and agreed with the findings in the report. No results found.    Orders Placed This Encounter  Procedures   Ferritin    Standing Status:   Future    Number of Occurrences:   1    Standing Expiration Date:   11/11/2024   Folate RBC    Standing Status:   Future    Number of Occurrences:   1    Standing Expiration Date:   11/11/2024   Retic Panel    Standing Status:   Future    Number of Occurrences:   1    Standing Expiration Date:   11/11/2024   Signatera    Select as applicable. If patient is on or planning to receive immunotherapy, select drug: Not on Immunotherapy If "Other or Multiple", Write down drug name:  Do not Delete Below This Line   ==========Department Information========== ID: [id number] Department:[facility] - A DEPT OF [facility] [address] [place] [address] Dept: [phone] Dept Fax: [phone]    Standing Status:   Standing  Number of Occurrences:   10    Standing Expiration Date:   11/11/2024    Order Specific Question:   Avelina Laine to follow up with patient for sample collection (mobile phleb, lab, or saliva):    Answer:   No    Order Specific Question:   Surveillance Program draw frequency:    Answer:   Every 3 Months    Order Specific Question:   Surveillance Program draw count:    Answer:   4    Order Specific  Question:   Cancer type:    Answer:   Breast    Order Specific Question:   Stage of diagnosis:    Answer:   III    Order Specific Question:   History of recurrence?    Answer:   No    Order Specific Question:   Current disease status:    Answer:   No evidence of disease    Order Specific Question:   Date of surgery (MM/DD/YYYY):    Answer:   02/09/2022    Order Specific Question:   Is the patient receiving or planning to receive immunotherapy?    Answer:   No    Order Specific Question:   Patient status:    Answer:   Outpatient    Order Specific Question:   Most recent progress/clinical note attached?    Answer:   Yes    Order Specific Question:   Pathology report attached?    Answer:   Yes    Order Specific Question:   Pathology institution name:    Answer:   [facility]    Order Specific Question:   Pathology institution address and fax number:    Answer:   [address]  [place], [place] [address] [phone]    Order Specific Question:   Tissue collection date (MM/DD/YYYY):    Answer:   02/09/2022    Order Specific Question:   By placing this electronic order I confirm the testing ordered herein is medically necessary and this patient has been informed of the details of the genetic test(s) ordered, including the risks, benefits, and alternatives, and has consented to testing.    Answer:   Yes    Order Specific Question:   What type of billing?    Answer:   Engineer, maintenance (IT) Specific Question:   CC Results    Answer:   Malachy Mood [[id number]]    Order Specific Question:   Release to patient    Answer:   Manual release only [2]    Order Specific Question:   Reason for preventing immediate release    Answer:   Reasonable likelihood of causing patient harm [2]    Order Specific Question:   Additional details for preventing immediate release    Answer:   Dr. Mosetta Putt will go over results with pt in clinic   All questions were answered. The patient  knows to call the clinic with any problems, questions or concerns. No barriers to learning was detected. The total time spent in the appointment was 30 minutes.     Malachy Mood, MD 11/12/2023

## 2023-11-13 LAB — FOLATE RBC
Folate, Hemolysate: 270 ng/mL
Folate, RBC: 922 ng/mL (ref >498)
Hematocrit: 29.3 % — ABNORMAL LOW (ref 34.0–46.6)

## 2023-11-13 LAB — HAPTOGLOBIN: Haptoglobin: 72 mg/dL (ref 37–355)

## 2023-11-14 ENCOUNTER — Other Ambulatory Visit: Payer: Self-pay

## 2023-11-18 ENCOUNTER — Ambulatory Visit (HOSPITAL_COMMUNITY)
Admission: RE | Admit: 2023-11-18 | Discharge: 2023-11-18 | Disposition: A | Discharge status: Home / Self Care | Payer: PPO | Source: Ambulatory Visit | Attending: Internal Medicine | Admitting: Internal Medicine

## 2023-11-18 DIAGNOSIS — Z79899 Other long term (current) drug therapy: Secondary | ICD-10-CM | POA: Diagnosis not present

## 2023-11-18 DIAGNOSIS — I081 Rheumatic disorders of both mitral and tricuspid valves: Secondary | ICD-10-CM | POA: Diagnosis not present

## 2023-11-18 DIAGNOSIS — I2510 Atherosclerotic heart disease of native coronary artery without angina pectoris: Secondary | ICD-10-CM | POA: Insufficient documentation

## 2023-11-18 DIAGNOSIS — I10 Essential (primary) hypertension: Secondary | ICD-10-CM | POA: Insufficient documentation

## 2023-11-18 DIAGNOSIS — Z01818 Encounter for other preprocedural examination: Secondary | ICD-10-CM | POA: Diagnosis not present

## 2023-11-18 DIAGNOSIS — E785 Hyperlipidemia, unspecified: Secondary | ICD-10-CM | POA: Diagnosis not present

## 2023-11-18 DIAGNOSIS — C50811 Malignant neoplasm of overlapping sites of right female breast: Secondary | ICD-10-CM | POA: Diagnosis not present

## 2023-11-18 DIAGNOSIS — Z5181 Encounter for therapeutic drug level monitoring: Secondary | ICD-10-CM | POA: Diagnosis not present

## 2023-11-18 DIAGNOSIS — Z170 Estrogen receptor positive status [ER+]: Secondary | ICD-10-CM | POA: Diagnosis not present

## 2023-11-18 LAB — ECHOCARDIOGRAM COMPLETE
AR max vel: 1.58 cm2
AV Area VTI: 1.56 cm2
AV Area mean vel: 1.61 cm2
AV Mean grad: 7 mm[Hg]
AV Peak grad: 13.2 mm[Hg]
Ao pk vel: 1.82 m/s
Area-P 1/2: 5.06 cm2
Calc EF: 70.4 %
MV VTI: 1.91 cm2
S' Lateral: 3.4 cm
Single Plane A2C EF: 68.7 %
Single Plane A4C EF: 70 %

## 2023-11-18 NOTE — Progress Notes (Signed)
  Echocardiogram 2D Echocardiogram has been performed.  Ocie Doyne RDCS 11/18/2023, 8:37 AM

## 2023-11-20 ENCOUNTER — Telehealth: Payer: Self-pay

## 2023-11-20 ENCOUNTER — Other Ambulatory Visit: Payer: Self-pay

## 2023-11-20 ENCOUNTER — Other Ambulatory Visit: Payer: Self-pay | Admitting: Cardiology

## 2023-11-20 NOTE — Telephone Encounter (Addendum)
Called patient and relayed the message below as per Dr. Mosetta Putt. Patient voiced full understanding.   ----- Message from Malachy Mood sent at 11/20/2023  9:50 AM EST ----- Please let pt know her lab results, no evidence of nutritional anemia or hemolysis, but iron level on normal low end, suggest her to take OTC iron pill once daily, and add lab appointment to repeat CBC and ferritin in 4 week with a phone visit after.  Malachy Mood

## 2023-11-21 ENCOUNTER — Other Ambulatory Visit (HOSPITAL_COMMUNITY): Payer: Self-pay

## 2023-11-21 MED ORDER — ROSUVASTATIN CALCIUM 5 MG PO TABS
ORAL_TABLET | ORAL | 0 refills | Status: DC
  Filled 2023-11-21 (×2): qty 143, 100d supply, fill #0
  Filled 2024-03-23: qty 143, 100d supply, fill #1

## 2023-11-22 ENCOUNTER — Other Ambulatory Visit (HOSPITAL_COMMUNITY): Payer: Self-pay

## 2023-11-22 ENCOUNTER — Other Ambulatory Visit: Payer: Self-pay

## 2023-11-26 ENCOUNTER — Other Ambulatory Visit: Payer: Self-pay

## 2023-11-26 ENCOUNTER — Other Ambulatory Visit: Payer: Self-pay | Admitting: Nurse Practitioner

## 2023-11-26 ENCOUNTER — Other Ambulatory Visit (HOSPITAL_COMMUNITY): Payer: Self-pay | Admitting: Pharmacy Technician

## 2023-11-26 ENCOUNTER — Other Ambulatory Visit (HOSPITAL_COMMUNITY): Payer: Self-pay

## 2023-11-26 DIAGNOSIS — C50911 Malignant neoplasm of unspecified site of right female breast: Secondary | ICD-10-CM

## 2023-11-26 MED ORDER — NERATINIB MALEATE 40 MG PO TABS
240.0000 mg | ORAL_TABLET | Freq: Every day | ORAL | 0 refills | Status: DC
  Filled 2023-11-26: qty 180, 30d supply, fill #0

## 2023-11-26 NOTE — Progress Notes (Signed)
Specialty Pharmacy Refill Coordination Note  Ashley Robertson is a 67 y.o. female contacted today regarding refills of specialty medication(s) [name] Maleate [facility])   Patient requested [name] at [facility] at [facility] date: 12/02/23   Medication will be filled on 11/29/23.

## 2023-11-26 NOTE — Addendum Note (Signed)
Addended by: Myriam Jacobson on: 11/26/2023 07:18 AM   Modules accepted: Orders

## 2023-11-28 ENCOUNTER — Other Ambulatory Visit (HOSPITAL_COMMUNITY): Payer: Self-pay

## 2023-11-29 ENCOUNTER — Other Ambulatory Visit: Payer: Self-pay

## 2023-12-02 ENCOUNTER — Other Ambulatory Visit (HOSPITAL_COMMUNITY): Payer: Self-pay

## 2023-12-05 ENCOUNTER — Other Ambulatory Visit (HOSPITAL_COMMUNITY): Payer: Self-pay

## 2023-12-05 MED ORDER — OXYCODONE HCL 5 MG PO TABS
5.0000 mg | ORAL_TABLET | ORAL | 0 refills | Status: DC | PRN
  Filled 2023-12-05: qty 5, 1d supply, fill #0

## 2023-12-05 MED ORDER — DOXYCYCLINE HYCLATE 100 MG PO CAPS
100.0000 mg | ORAL_CAPSULE | Freq: Two times a day (BID) | ORAL | 0 refills | Status: DC
  Filled 2023-12-05: qty 14, 7d supply, fill #0

## 2023-12-05 MED ORDER — METHOCARBAMOL 500 MG PO TABS
500.0000 mg | ORAL_TABLET | Freq: Three times a day (TID) | ORAL | 0 refills | Status: DC | PRN
  Filled 2023-12-05: qty 5, 2d supply, fill #0

## 2023-12-05 NOTE — H&P (Signed)
 ubjective Patient ID: Ashley Robertson is a 68 y.o. female.     HPI 16 months post op mastectomy with TE reconstruction. Scheduled for second stage reconstruction later this month.   Presented with palpable right breast mass. MMG/US  showed mass right breast 11 o'clock, 6 cmfn measuring 2.1 x 3.3 x 2.0 cm.  A 6 o clock mass present, 3 cmfn measuring 1.2 x 1.1 x 2.6 cm. No definitive abnormal axillary LN noted, however, a single LN with 3.1 mm cortex present.   Biopsies labeled right breast 6 o clock and 11o clock both with ILC, ER+/PR-, Her2+. Biopsy of LN with metastatic disease.   MRI demonstrated 8.5 x 8.3 x 5.9 cm area of mass-like enhancement in the right breast involving all 4 quadrants and containing 2 biopsy marker clip artifacts. Biopsy-proven metastatic LN in the right axilla. Possible metastatic IM LN noted.   Staging scans negative for distant disease. Completed neoadjuvant chemotherapy. Completed adjuvant radiation 11.22.2023. US  05/14/2022 with similar size mass noted. Completed [name] 06/2023   Final pathology 9.5 cm ILC 2/5 LN+. On [name] adjuvant through Sep 2024. On Anastrozole  and neratinib .   Genetics with VUS in BRIP1   Staging scans noted 5 cm AAA . Vascular Surgery recommended repair . Had open AAA repair and remains on ASA. Off Eliquis  for hx SVT.    Prior 69 G with Soma bra, reported that she did not feel really that large. Happy with pre mastectomy size. Right mastectomy 685 g MMG left 12/2022   Reports quit smoking 3.30.23. 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 Her daughter is an CHARITY FUNDRAISER for [employer] now, float pool for ICU.   PMH significant for HTN, HLD, pre DM, COPD.  Review of Systems   Objective Physical Exam  Cardiovascular: Normal rate, regular rhythm and normal heart sounds.    Pulmonary/Chest Effort normal and breath sounds normal.    Port left chest Chest: right chest expanded hyperpigmentation and some rippling upper inner expander noted Left breast grade 2  ptosis SN to nipple  L 26 cm BW  L 20 cm  Nipple to IMF L 8 cm   Assessment/Plan Right breast cancer overlapping sites ER+ metastatic to LN Neoadjuvant chemotherapy S/p right SRM, ALND, prepectoral TE/ADM (Alloderm) reconstruction Adjuvant RT   Hb 8.7 12.2024- oral iron recommended. Will need to repeat prior to surgery. ASA-on this for carotid bruit per patient. Plan to continue.   MMG left scheduled 1.13.2025. Pending this, plan removal left chest port, removal right chest TE placement silicone implant, left breast mastopexy.   Reviewed radiation significantly increases risk reconstruction including wound healing problems capsular contracture. At that time could do implant exchange alone, implant exchange with LD flap for radiated chest, or coversion to autologous. Discussed LD flap donor site, hospital stay, drains. Patient declies LD flap. Reviewed option consultation microsurgeon for purely autologous options. Declines this. Reviewed if any complications from this surgery may need to start reconstruction process over with LD or another type flap.   Reviewed saline vs silicone, shaped v round. As in prepectoral position I recommend HCG or capacity filled silicone implants to reduce risk visible rippling. Reviewed imaging surveillance for rupture with silicone implants. Reviewed examples for 4th generation, capacity filled 4th generation, and HCG implants vs saline implants. Counseled cannot assure cupe size, implant selection determined in part by width chest. Patient has elected for silicone, plan smooth round. Completed [place] physician patient checklist.   Over left breast, plan mastopexy. Reviewed anchor type scars.  Diminished sensation nipple and breast skin, risk of nipple loss, wound healing problems, asymmetry, incidental carcinoma, changes with wt gain/loss, aging, unacceptable cosmetic appearance reviewed. Reviewed asymmetry one should expect in setting unilateral implant  reconstruction, RT.    Drain log provided. Rx for oxycodone  doxycycline  and robaxin  given.    [name], MD MBA Plastic & Reconstructive Surgery  Office/ physician access line after hours [phone]   Natrelle 133S FV-13 -T 500 ml tissue expander placed 490 ml total fill volume saline

## 2023-12-06 ENCOUNTER — Other Ambulatory Visit (HOSPITAL_COMMUNITY): Payer: Self-pay

## 2023-12-07 LAB — SIGNATERA
SIGNATERA MTM READOUT: 0 MTM/ml
SIGNATERA TEST RESULT: NEGATIVE

## 2023-12-12 ENCOUNTER — Other Ambulatory Visit (HOSPITAL_COMMUNITY): Payer: Self-pay

## 2023-12-12 ENCOUNTER — Other Ambulatory Visit (HOSPITAL_BASED_OUTPATIENT_CLINIC_OR_DEPARTMENT_OTHER): Payer: Self-pay

## 2023-12-12 MED ORDER — TRIAMCINOLONE ACETONIDE 0.025 % EX CREA
1.0000 | TOPICAL_CREAM | Freq: Two times a day (BID) | CUTANEOUS | 2 refills | Status: DC
  Filled 2023-12-12: qty 15, 8d supply, fill #0

## 2023-12-13 ENCOUNTER — Telehealth: Payer: Self-pay

## 2023-12-13 ENCOUNTER — Encounter (HOSPITAL_BASED_OUTPATIENT_CLINIC_OR_DEPARTMENT_OTHER): Payer: Self-pay | Admitting: Plastic Surgery

## 2023-12-13 ENCOUNTER — Other Ambulatory Visit: Payer: Self-pay

## 2023-12-13 NOTE — Progress Notes (Signed)
   12/13/23 1157  PAT Phone Screen  Is the patient taking a GLP-1 receptor agonist? No  Do You Have Diabetes? No  Do You Have Hypertension? Yes  Have You Ever Been to the ER for Asthma? No  Have You Taken Oral Steroids in the Past 3 Months? No  Do you Take Phenteramine or any Other Diet Drugs? No  Recent  Lab Work, EKG, CXR? Yes  Where was this test performed? 01-13-23 EKG-a-fib with RVR 115  Do you have a history of heart problems? (S)  Yes (had a-fib after AAA repair in 2--2024, converted with amiodarone )  Cardiologist Name Dr Alvan  Have you ever had tests on your heart? Yes  What cardiac tests were performed? Echo  What date/year were cardiac tests completed? 11-18-23 ECHO EF 55-60%  Results viewable: [facility] Media Tab  Any Recent Hospitalizations? No  Height 5' 9 (1.753 m)  Weight 64.9 kg  Pat Appointment Scheduled (S)  Yes (EKG)

## 2023-12-13 NOTE — Telephone Encounter (Addendum)
 Called patient and relayed message below as per Dr. Lanny patient voiced full understanding with no questions.  ----- Message from Onita Lanny sent at 12/09/2023  7:08 PM EST ----- [name],   Please let pt know the negative test result, which is great. OK to proceed with port removal which is scheduled for 1/17.  thx   Onita Lanny

## 2023-12-16 ENCOUNTER — Ambulatory Visit
Admission: RE | Admit: 2023-12-16 | Discharge: 2023-12-16 | Disposition: A | Discharge status: Home / Self Care | Payer: PPO | Source: Ambulatory Visit | Attending: Nurse Practitioner | Admitting: Nurse Practitioner

## 2023-12-16 DIAGNOSIS — Z1231 Encounter for screening mammogram for malignant neoplasm of breast: Secondary | ICD-10-CM

## 2023-12-17 ENCOUNTER — Other Ambulatory Visit: Payer: Self-pay

## 2023-12-17 DIAGNOSIS — C50811 Malignant neoplasm of overlapping sites of right female breast: Secondary | ICD-10-CM

## 2023-12-17 DIAGNOSIS — D649 Anemia, unspecified: Secondary | ICD-10-CM

## 2023-12-17 NOTE — Assessment & Plan Note (Signed)
 invasive lobular carcinoma, Stage IIIA, c(T3, N1), yp(T3, N1a), ER+/PR-/HER2+, Grade 2  -Diagnosed in 01/2022 -s/p neoadjuvant chemo TCHP X6, followed by right mastectomy by Dr. Dwain Sarna and reconstruction by Dr. Leta Baptist. Unfortunately she did not have much response to neoadjvuant chemo  -on adjuvant Kadcyla now, tolerating well with no noticeable side effects. Plan for a total of 14 treatment -she received postmastectomy radiation under Dr. Mitzi Hansen, 10/10 - 10/29/22. -she started adjuvant anastrozole in January 2024, she is tolerating very well -Echocardiogram in January 2024 showed slightly decreased EF 50 to 55%.  She was seen by cardiologist after echo, her cardiac medication was adjusted, and we will continue HER2 antibody.  repeated echo on 03/18/23 showed normal EF 55%. She finished Kadcyla in 06/2023

## 2023-12-18 ENCOUNTER — Other Ambulatory Visit: Payer: Self-pay

## 2023-12-18 ENCOUNTER — Inpatient Hospital Stay: Payer: PPO | Attending: Hematology

## 2023-12-18 ENCOUNTER — Inpatient Hospital Stay (HOSPITAL_BASED_OUTPATIENT_CLINIC_OR_DEPARTMENT_OTHER): Payer: PPO | Admitting: Hematology

## 2023-12-18 ENCOUNTER — Telehealth: Payer: PPO | Admitting: Hematology

## 2023-12-18 DIAGNOSIS — Z170 Estrogen receptor positive status [ER+]: Secondary | ICD-10-CM | POA: Diagnosis not present

## 2023-12-18 DIAGNOSIS — C50811 Malignant neoplasm of overlapping sites of right female breast: Secondary | ICD-10-CM | POA: Insufficient documentation

## 2023-12-18 DIAGNOSIS — Z79899 Other long term (current) drug therapy: Secondary | ICD-10-CM | POA: Insufficient documentation

## 2023-12-18 DIAGNOSIS — C773 Secondary and unspecified malignant neoplasm of axilla and upper limb lymph nodes: Secondary | ICD-10-CM | POA: Diagnosis present

## 2023-12-18 DIAGNOSIS — Z79811 Long term (current) use of aromatase inhibitors: Secondary | ICD-10-CM | POA: Insufficient documentation

## 2023-12-18 DIAGNOSIS — Z7982 Long term (current) use of aspirin: Secondary | ICD-10-CM | POA: Diagnosis not present

## 2023-12-18 DIAGNOSIS — D649 Anemia, unspecified: Secondary | ICD-10-CM | POA: Insufficient documentation

## 2023-12-18 DIAGNOSIS — Z9011 Acquired absence of right breast and nipple: Secondary | ICD-10-CM | POA: Insufficient documentation

## 2023-12-18 DIAGNOSIS — D696 Thrombocytopenia, unspecified: Secondary | ICD-10-CM | POA: Diagnosis not present

## 2023-12-18 DIAGNOSIS — C50911 Malignant neoplasm of unspecified site of right female breast: Secondary | ICD-10-CM

## 2023-12-18 LAB — CBC WITH DIFFERENTIAL (CANCER CENTER ONLY)
Abs Immature Granulocytes: 0.01 10*3/uL (ref 0.00–0.07)
Basophils Absolute: 0 10*3/uL (ref 0.0–0.1)
Basophils Relative: 1 %
Eosinophils Absolute: 0.2 10*3/uL (ref 0.0–0.5)
Eosinophils Relative: 4 %
HCT: 38.1 % (ref 36.0–46.0)
Hemoglobin: 12.4 g/dL (ref 12.0–15.0)
Immature Granulocytes: 0 %
Lymphocytes Relative: 20 %
Lymphs Abs: 0.8 10*3/uL (ref 0.7–4.0)
MCH: 29.1 pg (ref 26.0–34.0)
MCHC: 32.5 g/dL (ref 30.0–36.0)
MCV: 89.4 fL (ref 80.0–100.0)
Monocytes Absolute: 0.4 10*3/uL (ref 0.1–1.0)
Monocytes Relative: 11 %
Neutro Abs: 2.5 10*3/uL (ref 1.7–7.7)
Neutrophils Relative %: 64 %
Platelet Count: 105 10*3/uL — ABNORMAL LOW (ref 150–400)
RBC: 4.26 MIL/uL (ref 3.87–5.11)
Smear Review: NORMAL
WBC Count: 4 10*3/uL (ref 4.0–10.5)
nRBC: 0 % (ref 0.0–0.2)

## 2023-12-18 LAB — CMP (CANCER CENTER ONLY)
ALT: 24 U/L (ref 0–44)
AST: 43 U/L — ABNORMAL HIGH (ref 15–41)
Albumin: 3.8 g/dL (ref 3.5–5.0)
Alkaline Phosphatase: 168 U/L — ABNORMAL HIGH (ref 38–126)
Anion gap: 4 — ABNORMAL LOW (ref 5–15)
BUN: 14 mg/dL (ref 8–23)
CO2: 27 mmol/L (ref 22–32)
Calcium: 9.1 mg/dL (ref 8.9–10.3)
Chloride: 104 mmol/L (ref 98–111)
Creatinine: 0.74 mg/dL (ref 0.44–1.00)
GFR, Estimated: >60 mL/min (ref >60)
Glucose, Bld: 109 mg/dL — ABNORMAL HIGH (ref 70–99)
Potassium: 4.3 mmol/L (ref 3.5–5.1)
Sodium: 135 mmol/L (ref 135–145)
Total Bilirubin: 0.6 mg/dL (ref 0.0–1.2)
Total Protein: 7.2 g/dL (ref 6.5–8.1)

## 2023-12-18 NOTE — Progress Notes (Signed)
 [facility]   [phone] Fax:([phone]   Clinic Follow up Note   Patient Care Team: Helyn Lobstein, MD as PCP - General (Family Medicine) Arleen Lacer, MD as PCP - Cardiology (Cardiology) Auther Bo, RN as Oncology Nurse Navigator Alane Hsu, RN as Oncology Nurse Navigator Enid Harry, MD as Consulting Physician (General Surgery) Sonja [employer], MD as Consulting Physician (Hematology) Johna Myers, MD as Consulting Physician (Radiation Oncology) 12/18/2023  I connected with Ashley Robertson on 12/18/23 at  1:20 PM EST by telephone and verified that I am speaking with the correct person using two identifiers.   I discussed the limitations, risks, security and privacy concerns of performing an evaluation and management service by telephone and the availability of in person appointments. I also discussed with the patient that there may be a patient responsible charge related to this service. The patient expressed understanding and agreed to proceed.   Patient's location:  Home  Provider's location:  Office    CHIEF COMPLAINT: Follow-up of anemia   CURRENT THERAPY: Oral iron and multivitamin, anastrozole  and neratinib   Oncology history Malignant neoplasm of overlapping sites of right breast in female, estrogen receptor positive (HCC) invasive lobular carcinoma, Stage IIIA, c(T3, N1), yp(T3, N1a), ER+/PR-/HER2+, Grade 2  -Diagnosed in 01/2022 -s/p neoadjuvant chemo TCHP X6, followed by right mastectomy by Dr. Delane Fear and reconstruction by Dr. Marieta Shorten. Unfortunately she did not have much response to neoadjvuant chemo  -on adjuvant Kadcyla  now, tolerating well with no noticeable side effects. Plan for a total of 14 treatment -she received postmastectomy radiation under Dr. Jeryl Moris, 10/10 - 10/29/22. -she started adjuvant anastrozole  in January 2024, she is tolerating very well -Echocardiogram in January 2024 showed slightly decreased EF 50  to 55%.  She was seen by cardiologist after echo, her cardiac medication was adjusted, and we will continue HER2 antibody.  repeated echo on 03/18/23 showed normal EF 55%. She finished Kadcyla  in 06/2023  Assessment and Plan    Thrombocytopenia Platelet count is 105 x10^9/L (normal range: 150-400 x10^9/L), consistent with previous results, likely related to prior chemotherapy. No significant bleeding reported, though easy bruising noted. Platelet count is above 100 x10^9/L, considered safe for surgery. Informed consent provided regarding implant surgery safety. - Inform Dr. Zumeba about current platelet count prior to implant surgery - Monitor for signs of bleeding or bruising - Reassess platelet count at next follow-up  Anemia Previously diagnosed with anemia, hemoglobin was 8.5 g/dL (normal range: 81-19 g/dL) last month. Current hemoglobin levels are normal, likely due to prenatal vitamins and iron supplements. - Continue prenatal vitamins and iron supplements - Follow up in two months for re-evaluation  Breast Cancer Currently in follow-up with no new symptoms or concerns discussed. - Continue regular follow-up as scheduled  Follow-up - Follow up in March for re-evaluation.  -She is scheduled for implant placement in 2 days        SUMMARY OF ONCOLOGIC HISTORY: Oncology History Overview Note   Cancer Staging  Malignant neoplasm of overlapping sites of right breast in female, estrogen receptor positive (HCC) Staging form: Breast, AJCC 8th Edition - Clinical stage from 02/09/2022: Stage IIA (cT2, cN1, cM0, G2, ER+, PR-, HER2+) - Signed by Sonja [place], MD on 02/20/2022    Malignant neoplasm of overlapping sites of right breast in female, estrogen receptor positive (HCC)  02/08/2022 Mammogram   CLINICAL DATA:  Acute onset right breast lump.  EXAM: DIGITAL DIAGNOSTIC UNILATERAL RIGHT MAMMOGRAM WITH TOMOSYNTHESIS AND CAD;  ULTRASOUND RIGHT BREAST LIMITED  IMPRESSION: 1. Highly  suspicious findings mammographically and sonographically. I suspect there is significant malignancy throughout most of the right breast. Discrete masses are seen at 11 o'clock and 6 o'clock.  A single borderline lymph node is identified. This lymph node appears prominent compared to the remainder of the lymph nodes. The skin thickening suggests the possibility of inflammatory breast cancer.   02/09/2022 Cancer Staging   Staging form: Breast, AJCC 8th Edition - Clinical stage from 02/09/2022: Stage IIIA (cT3, cN1, cM0, G2, ER+, PR-, HER2+) - Signed by Sonja Fallis, MD on 03/05/2022 Stage prefix: Initial diagnosis Histologic grading system: 3 grade system   02/09/2022 Initial Biopsy   Diagnosis 1. Breast, right, needle core biopsy, right breast 11 o'clock mass ribbon clip - INVASIVE MAMMARY CARCINOMA - SEE COMMENT 2. Breast, right, needle core biopsy, right breast 6'oclock mass, coil clip - INVASIVE MAMMARY CARCINOMA - SEE COMMENT 3. Lymph node, needle/core biopsy, right axillary lymph node, tribell clip - METASTATIC CARCINOMA INVOLVING NODAL TISSUE Microscopic Comment 1. and 2. The biopsy material shows an infiltrative proliferation of cells arranged linearly and in small clusters. Based on the biopsy, the carcinoma appears Nottingham grade 2 of 3 and measures 1.5 cm in greatest linear extent.  Addendum parts 1 and 2: Immunohistochemistry for E-cadherin is negative consistent with lobular carcinoma.  2. PROGNOSTIC INDICATORS Results: The tumor cells are EQUIVOCAL for Her2 (2+). Her2 by FISH will be performed and the results reported separately. Estrogen Receptor: 100%, POSITIVE, STRONG STAINING INTENSITY Progesterone Receptor: <1%, NEGATIVE Proliferation Marker Ki67: 10%  2. FLUORESCENCE IN-SITU HYBRIDIZATION Results: GROUP 1: HER2 **POSITIVE**   3. PROGNOSTIC INDICATORS Results: By immunohistochemistry, the tumor cells are POSITIVE for Her2 (3+). Estrogen Receptor: 100%, POSITIVE,  STRONG STAINING INTENSITY Progesterone Receptor: <1%, NEGATIVE   02/16/2022 Initial Diagnosis   Malignant neoplasm of overlapping sites of right breast in female, estrogen receptor positive (HCC)   03/01/2022 Imaging   EXAM: BILATERAL BREAST MRI WITH AND WITHOUT CONTRAST  IMPRESSION: 1. 8.5 x 8.3 x 5.9 cm area of confluent mass-like enhancement in the right breast involving all 4 quadrants and containing 2 biopsy marker clip artifacts, compatible with 4 quadrant biopsy-proven malignancy. 2. Biopsy-proven metastatic lymph node in the right axilla. 3. Possible metastatic intramammary lymph node in the posterior outer right breast just above the level of the nipple. 4. No evidence of malignancy on the left.   03/01/2022 Genetic Testing   Negative hereditary cancer genetic testing: no pathogenic variants detected in [employer] CustomNext-Cancer +RNAinsight Panel.  Variant of uncertain significance reported in BRIP1 at p.F934V (c.2800T>G). Report date is March 01, 2022.    The CustomNext-Cancer+RNAinsight panel offered by [employer] includes sequencing and rearrangement analysis for the following 47 genes:  APC, ATM, AXIN2, BARD1, BMPR1A, BRCA1, BRCA2, BRIP1, CDH1, CDK4, CDKN2A, CHEK2, DICER1, EPCAM, GREM1, HOXB13, MEN1, MLH1, MSH2, MSH3, MSH6, MUTYH, NBN, NF1, NF2, NTHL1, PALB2, PMS2, POLD1, POLE, PTEN, RAD51C, RAD51D, RECQL, RET, SDHA, SDHAF2, SDHB, SDHC, SDHD, SMAD4, SMARCA4, STK11, TP53, TSC1, TSC2, and VHL.  RNA data is routinely analyzed for use in variant interpretation for all genes.  UPDATE: BRIP1 p.F934V VUS has been amended to Likely Benign. Amended report date is 11/06/2022.    03/05/2022 Imaging   EXAM: CT CHEST, ABDOMEN, AND PELVIS WITH CONTRAST  IMPRESSION: 1. Asymmetric, masslike density of the glandular tissue of the right breast with overlying skin thickening, in keeping with known primary breast malignancy. 2. Enlarged, ill-defined right axillary lymph node containing  a biopsy marking clip, consistent with known nodal metastatic disease. 3. No other evidence of lymphadenopathy or metastatic disease in the chest, abdomen, or pelvis. 4. Emphysema. Background of fine centrilobular nodularity, most concentrated in the lung apices, consistent with smoking-related respiratory bronchiolitis. 5. Aneurysm of the infrarenal abdominal aorta measuring up to 5.4 x 5.3 cm with a large burden of eccentric mural thrombus. Recommend follow-up CT/MR every 6 months and vascular consultation. This recommendation follows ACR consensus guidelines: White Paper of the ACR Incidental Findings Committee II on Vascular Findings. J Am Coll Radiol 2013; 10:789-794. 6. Coronary artery disease.   03/05/2022 Imaging   EXAM: NUCLEAR MEDICINE WHOLE BODY BONE SCAN  IMPRESSION: No evidence of bony metastatic disease.   03/07/2022 - 08/01/2022 Chemotherapy   Patient is on Treatment Plan : BREAST  Docetaxel  + Carboplatin  + Trastuzumab  + Pertuzumab   (TCHP) q21d      07/26/2022 Surgery   Right mastectomy with axillary node seed guided excision converted to axillary node dissection with reconstruction by Drs. Delane Fear and Thimmappa   07/26/2022 Pathology Results   FINAL MICROSCOPIC DIAGNOSIS:   A. LYMPH NODE, RIGHT AXILLARY TARGETED, EXCISION:  - Metastatic carcinoma involving one lymph node, 2 cm (1/1).  - Focal extranodal extension.  - Biopsy site and biopsy clip.   B. BREAST, RIGHT, MASTECTOMY:  - Invasive lobular carcinoma, 9.5 cm (ypT3).  - Carcinoma involves dermis of nipple.  - All surgical margins negative for carcinoma.  - One lymph node negative for metastatic carcinoma (0/1).  - Biopsy sites and biopsy clips.  - See oncology table.   C. LYMPH NODE, RIGHT AXILLARY, SENTINEL, EXCISION:  - One lymph node negative for metastatic carcinoma (0/1).   D. LYMPH NODE, RIGHT AXILLARY, SENTINEL, EXCISION:  - Metastatic carcinoma in one lymph node, 0.5 cm. (1/1).   E. LYMPH NODE,  RIGHT AXILLARY, SENTINEL, EXCISION:  - One lymph node negative for metastatic carcinoma (0/1).    07/26/2022 Cancer Staging   Cancer Staging  Malignant neoplasm of overlapping sites of right breast in female, estrogen receptor positive (HCC) Staging form: Breast, AJCC 8th Edition - Clinical stage from 02/09/2022: Stage IIIA (cT3, cN1, cM0, G2, ER+, PR-, HER2+) - Signed by Sonja [place], MD on 03/05/2022 Stage prefix: Initial diagnosis Histologic grading system: 3 grade system - Pathologic stage from 07/26/2022: Stage IIIA (pT3, pN1a, cM0, G2, ER+, PR-, HER2+) - Signed by Rolin Clifton, NP on 08/01/2022 Histologic grading system: 3 grade system    07/26/2022 Cancer Staging   Staging form: Breast, AJCC 8th Edition - Pathologic stage from 07/26/2022: Stage IIIA (pT3, pN1a, cM0, G2, ER+, PR-, HER2+) - Signed by Burton, Lacie K, NP on 08/01/2022 Histologic grading system: 3 grade system   08/24/2022 -  Chemotherapy   Patient is on Treatment Plan : BREAST ADO-Trastuzumab Emtansine  (Kadcyla ) q21d     07/24/2023 Imaging   Echocardiogram  Normal LVF at 55-60 % Normal left diastolic function. Normal right ventricle  Normal visualized valves with preserved function     Discussed the use of AI scribe software for clinical note transcription with the patient, who gave verbal consent to proceed.  History of Present Illness   The patient, a 68 year old with a history of breast cancer and recent anemia, was scheduled for a phone visit to review recent lab results. She had been quite anemic with a hemoglobin of 8.5 (normal 12-15) but has since started taking prenatal vitamins and iron supplements. The patient reports no current bleeding, but notes that her  arms bruise easily. She is scheduled for implant surgery in two days.         REVIEW OF SYSTEMS:   Constitutional: Denies fevers, chills or abnormal weight loss Eyes: Denies blurriness of vision Ears, nose, mouth, throat, and face: Denies mucositis or  sore throat Respiratory: Denies cough, dyspnea or wheezes Cardiovascular: Denies palpitation, chest discomfort or lower extremity swelling Gastrointestinal:  Denies nausea, heartburn or change in bowel habits Skin: Denies abnormal skin rashes Lymphatics: Denies new lymphadenopathy or easy bruising Neurological:Denies numbness, tingling or new weaknesses Behavioral/Psych: Mood is stable, no new changes  All other systems were reviewed with the patient and are negative.  MEDICAL HISTORY:  Past Medical History:  Diagnosis Date   Allergy    Lisinopril  and lipitor   Anxiety and depression    Aortic atherosclerosis (HCC) 03/2020   CT Chest: 2 V (LAD & LCx) Coronary Atherosclerosis, Aortic Atherosclerosis (no aneurysm).  Mild centrilobular emphysema with mild diffuse bronchial thickening; several scattered small solitary pulmonary nodules (largest 5.6 mm in anterior left upper lobe)   Breast cancer (HCC) 12/2021   Right breast ILC   Carotid artery plaque, bilateral 10/2015   Mild to moderate plaque L>R without significant stenosis   COPD (chronic obstructive pulmonary disease) (HCC)    Coronary Artery Calcification - Score 79    Coronary Calcium  Score 79.  LAD and circumflex calcification noted.  Normal ascending aorta with mild calcification.   Current every day smoker    pt quit smoking April 2023   Dysrhythmia 01/2023   developed post op a-fib after AAA repair, converted with amiodarone    Emphysema lung (HCC)    Noted on chest CT   Family history of breast cancer 02/21/2022   Family history of prostate cancer 02/21/2022   Hyperlipidemia    Hypertension    Controlled with amlodipine    Prediabetes    Skin cancer 2021   Remove 2022    SURGICAL HISTORY: Past Surgical History:  Procedure Laterality Date   ABDOMINAL AORTIC ANEURYSM REPAIR N/A 01/11/2023   Procedure: ANEURYSM ABDOMINAL AORTIC REPAIR;  Surgeon: Margherita Shell, MD;  Location: [facility] OR;  Service: Vascular;  Laterality:  N/A;   BREAST BIOPSY Right    times 3   BREAST RECONSTRUCTION WITH PLACEMENT OF TISSUE EXPANDER AND ALLODERM Right 07/26/2022   Procedure: RIGHT BREAST RECONSTRUCTION WITH PLACEMENT OF TISSUE EXPANDER AND ALLODERM;  Surgeon: [name], MD;  Location: [facility] OR;  Service: Plastics;  Laterality: Right;   CESAREAN SECTION  1990, 1991   COSMETIC SURGERY  1981   Rhinoplasty   MASTECTOMY Right    MASTECTOMY W/ SENTINEL NODE BIOPSY Right 07/26/2022   Procedure: RIGHT MASTECTOMY, RIGHT AXILLARY SENTINEL NODE BIOPSY;  Surgeon: Enid Harry, MD;  Location: [facility] OR;  Service: General;  Laterality: Right;  GEN & PEC BLOCK   PORTACATH PLACEMENT Left 03/06/2022   Procedure: INSERTION PORT-A-CATH;  Surgeon: Enid Harry, MD;  Location: [facility];  Service: General;  Laterality: Left;   RADIOACTIVE SEED GUIDED AXILLARY SENTINEL LYMPH NODE Right 07/26/2022   Procedure: RADIOACTIVE SEED GUIDED AXILLARY SENTINEL LYMPH NODE DISSECTION;  Surgeon: Enid Harry, MD;  Location: [facility] OR;  Service: General;  Laterality: Right;   RHINOPLASTY  1981   TONSILLECTOMY  1978   TRANSTHORACIC ECHOCARDIOGRAM  10/2016   EF 55 to 60%.  Normal systolic and diastolic function.  No ASD/PFO   TUBAL LIGATION  1991    I have reviewed the social history and family history with  the patient and they are unchanged from previous note.  ALLERGIES:  is allergic to lisinopril  and lipitor [atorvastatin].  MEDICATIONS:  Current Outpatient Medications  Medication Sig Dispense Refill   albuterol  (PROVENTIL  HFA) 108 (90 Base) MCG/ACT inhaler Inhale 1 to 2 puffs into the lungs every 6 hours as needed 8.5 g 2   alendronate  (FOSAMAX ) 70 MG tablet Take 1 tablet by mouth once weekly --Take 30 minutes before the first food, beverage or medicine of the day with plain water 12 tablet 3   amLODipine  (NORVASC ) 10 MG tablet Take 1 tablet (10 mg total) by mouth at bedtime. 90 tablet 1   anastrozole  (ARIMIDEX ) 1 MG  tablet Take 1 tablet (1 mg) by mouth daily. 90 tablet 3   aspirin  81 MG tablet Take 81 mg by mouth at bedtime.     Calcium  Carb-Cholecalciferol  (CALCIUM  500 + D3 PO) Take 1 tablet by mouth daily.     carvedilol  (COREG ) 6.25 MG tablet Take 1 tablet (6.25 mg total) by mouth 2 (two) times daily. 180 tablet 3   Cholecalciferol  (VITAMIN D ) 50 MCG (2000 UT) CAPS Take 1 capsule by mouth daily. 30 capsule 2   doxycycline  (VIBRAMYCIN ) 100 MG capsule Take 1 capsule (100 mg total) by mouth 2 (two) times daily. Take with 8 oz water. Do not lie down for at least 30 minutes after. For use following surgery 14 capsule 0   ezetimibe  (ZETIA ) 10 MG tablet Take 1 tablet (10 mg total) by mouth daily. 90 tablet 3   ferrous sulfate 325 (65 FE) MG EC tablet Take 325 mg by mouth 3 (three) times daily with meals.     FLUoxetine  (PROZAC ) 20 MG capsule Take 1 capsule (20 mg total) by mouth daily. 90 capsule 3   Fluticasone -Umeclidin-Vilant (TRELEGY ELLIPTA ) 200-62.5-25 MCG/ACT AEPB Inhale 1 puff into the lungs daily. 60 each 2   methocarbamol  (ROBAXIN ) 500 MG tablet Take 1 tablet (500 mg total) by mouth 3 (three) times daily as needed for muscle spasms. 5 tablet 0   [name]  Maleate (NERLYNX ) 40 MG tablet Take 6 tablets (240 mg total) by mouth daily. Take with food. 180 tablet 0   OVER THE COUNTER MEDICATION Take 1 capsule by mouth daily. Serrapeptase 120,000 units daily  Reported on 12/27/2015     oxyCODONE  (OXY IR/ROXICODONE ) 5 MG immediate release tablet Take 1 tablet (5 mg total) by mouth every 4 (four) hours as needed for moderate pain. For use following surgery. 5 tablet 0   prenatal vitamin w/FE, FA (PRENATAL 1 + 1) 27-1 MG TABS tablet Take 1 tablet by mouth daily at 12 noon.     rosuvastatin  (CRESTOR ) 5 MG tablet Take 1 tablet by mouth daily at bedtime ,except on Monday, Wednesday and Fridays take 2 tablets at bedtime as directed 180 tablet 0   Specialty Vitamins Products (MAGNESIUM , AMINO ACID CHELATE,) 133 MG tablet  Take 1 tablet by mouth at bedtime.     triamcinolone  (KENALOG ) 0.025 % cream Apply 1 Application topically 2 (two) times daily to rash around eyes for 3-4 weeks, then as needed for flares; not safe for long term use. 15 g 2   No current facility-administered medications for this visit.    PHYSICAL EXAMINATION: Not performed   LABORATORY DATA:  I have reviewed the data as listed    Latest Ref Rng & Units 12/18/2023    9:45 AM 11/12/2023    9:35 AM 11/12/2023    8:24 AM  CBC  WBC 4.0 -  10.5 K/uL 4.0   3.4   Hemoglobin 12.0 - 15.0 g/dL 94.7   8.5   Hematocrit 36.0 - 46.0 % 38.1  29.3  27.7   Platelets 150 - 400 K/uL 105   135         Latest Ref Rng & Units 12/18/2023    9:45 AM 11/12/2023    8:24 AM 09/18/2023    8:21 AM  CMP  Glucose 70 - 99 mg/dL 654  650  98   BUN 8 - 23 mg/dL 14  14  17    Creatinine 0.44 - 1.00 mg/dL 3.54  6.56  8.12   Sodium 135 - 145 mmol/L 135  139  137   Potassium 3.5 - 5.1 mmol/L 4.3  4.4  4.2   Chloride 98 - 111 mmol/L 104  107  103   CO2 22 - 32 mmol/L 27  27  26    Calcium  8.9 - 10.3 mg/dL 9.1  8.7  8.7   Total Protein 6.5 - 8.1 g/dL 7.2  6.7  6.4   Total Bilirubin 0.0 - 1.2 mg/dL 0.6  0.4  0.7   Alkaline Phos 38 - 126 U/L 168  169  113   AST 15 - 41 U/L 43  35  32   ALT 0 - 44 U/L 24  20  27        RADIOGRAPHIC STUDIES: I have personally reviewed the radiological images as listed and agreed with the findings in the report. No results found.     I discussed the assessment and treatment plan with the patient. The patient was provided an opportunity to ask questions and all were answered. The patient agreed with the plan and demonstrated an understanding of the instructions.   The patient was advised to call back or seek an in-person evaluation if the symptoms worsen or if the condition fails to improve as anticipated.  I provided 11 minutes of non face-to-face telephone visit time during this encounter, and > 50% was spent counseling as  documented under my assessment & plan.     Sonja [place], MD 12/18/23

## 2023-12-19 ENCOUNTER — Other Ambulatory Visit (HOSPITAL_COMMUNITY): Payer: Self-pay

## 2023-12-19 ENCOUNTER — Other Ambulatory Visit: Payer: Self-pay | Admitting: Hematology

## 2023-12-19 ENCOUNTER — Encounter (HOSPITAL_BASED_OUTPATIENT_CLINIC_OR_DEPARTMENT_OTHER)
Admission: RE | Admit: 2023-12-19 | Discharge: 2023-12-19 | Disposition: A | Discharge status: Home / Self Care | Payer: PPO | Source: Ambulatory Visit | Attending: Plastic Surgery | Admitting: Plastic Surgery

## 2023-12-19 DIAGNOSIS — C50911 Malignant neoplasm of unspecified site of right female breast: Secondary | ICD-10-CM

## 2023-12-19 DIAGNOSIS — Z9221 Personal history of antineoplastic chemotherapy: Secondary | ICD-10-CM | POA: Diagnosis not present

## 2023-12-19 DIAGNOSIS — I10 Essential (primary) hypertension: Secondary | ICD-10-CM | POA: Diagnosis not present

## 2023-12-19 DIAGNOSIS — J449 Chronic obstructive pulmonary disease, unspecified: Secondary | ICD-10-CM | POA: Diagnosis not present

## 2023-12-19 DIAGNOSIS — Z923 Personal history of irradiation: Secondary | ICD-10-CM | POA: Diagnosis not present

## 2023-12-19 DIAGNOSIS — Z9011 Acquired absence of right breast and nipple: Secondary | ICD-10-CM | POA: Diagnosis not present

## 2023-12-19 DIAGNOSIS — Z87891 Personal history of nicotine dependence: Secondary | ICD-10-CM | POA: Diagnosis not present

## 2023-12-19 DIAGNOSIS — I2510 Atherosclerotic heart disease of native coronary artery without angina pectoris: Secondary | ICD-10-CM | POA: Diagnosis not present

## 2023-12-19 DIAGNOSIS — Z853 Personal history of malignant neoplasm of breast: Secondary | ICD-10-CM | POA: Diagnosis not present

## 2023-12-19 DIAGNOSIS — Z7982 Long term (current) use of aspirin: Secondary | ICD-10-CM | POA: Diagnosis not present

## 2023-12-19 DIAGNOSIS — Z421 Encounter for breast reconstruction following mastectomy: Secondary | ICD-10-CM | POA: Diagnosis not present

## 2023-12-19 DIAGNOSIS — Z452 Encounter for adjustment and management of vascular access device: Secondary | ICD-10-CM | POA: Diagnosis not present

## 2023-12-19 MED ORDER — CHLORHEXIDINE GLUCONATE CLOTH 2 % EX PADS: 6.0000 | MEDICATED_PAD | Freq: Once | CUTANEOUS | Status: DC

## 2023-12-19 NOTE — Progress Notes (Signed)
Specialty Pharmacy Ongoing Clinical Assessment Note  Ashley Robertson is a 68 y.o. female who is being followed by the specialty pharmacy service for RxSp Oncology   Patient's specialty medication(s) reviewed today: [name] Maleate (NERLYNX)   Missed doses in the last 4 weeks: 0   Patient/Caregiver asked additional questions regarding drug interactions with her planned medications for surgery, see intervention notes below .  Therapeutic benefit summary: Patient is achieving benefit   Adverse events/side effects summary: No adverse events/side effects   Patient's therapy is appropriate to: Continue    Goals Addressed             This Visit's Progress    Achieve or maintain remission   On track    Patient is on track. Patient will maintain adherence.  Ashley Robertson is stable at this time.          Follow up:  3 months  Ashley Robertson Specialty Pharmacist   Clinical Intervention Note  Clinical Intervention Notes: Patient is having reconstructive surgery and is expecting to start on doxycycline, oxycodone, and methocarbamol. I informed the patient there were no interactions with her Nerlynx, but that methomarbamol and oxycodone can cause increased sedation and respiratory depression, she was understanding.   Clinical Intervention Outcomes: Prevention of an adverse drug event   Ashley Robertson

## 2023-12-19 NOTE — Progress Notes (Signed)

## 2023-12-19 NOTE — Anesthesia Preprocedure Evaluation (Addendum)
Anesthesia Evaluation  Patient identified by MRN, date of birth, ID band Patient awake    Reviewed: Allergy & Precautions, NPO status , Patient's Chart, lab work & pertinent test results  Airway Mallampati: II  TM Distance: >3 FB Neck ROM: Full    Dental  (+) Upper Dentures   Pulmonary COPD,  COPD inhaler, former smoker   breath sounds clear to auscultation       Cardiovascular hypertension, Pt. on medications and Pt. on home beta blockers + CAD  + dysrhythmias  Rhythm:Regular Rate:Normal     Neuro/Psych    GI/Hepatic negative GI ROS, Neg liver ROS,,,  Endo/Other  negative endocrine ROS    Renal/GU negative Renal ROS     Musculoskeletal negative musculoskeletal ROS (+)    Abdominal   Peds  Hematology negative hematology ROS (+)   Anesthesia Other Findings   Reproductive/Obstetrics                             Anesthesia Physical Anesthesia Plan  ASA: 3  Anesthesia Plan: General   Post-op Pain Management:    Induction: Intravenous  PONV Risk Score and Plan: 4 or greater and Ondansetron, Dexamethasone, Midazolam and Scopolamine patch - Pre-op  Airway Management Planned: Oral ETT  Additional Equipment: None  Intra-op Plan:   Post-operative Plan: Extubation in OR  Informed Consent: I have reviewed the patients History and Physical, chart, labs and discussed the procedure including the risks, benefits and alternatives for the proposed anesthesia with the patient or authorized representative who has indicated his/her understanding and acceptance.     Dental advisory given  Plan Discussed with: CRNA  Anesthesia Plan Comments:        Anesthesia Quick Evaluation

## 2023-12-19 NOTE — Progress Notes (Addendum)
Specialty Pharmacy Refill Coordination Note  Ashley Robertson is a 68 y.o. female contacted today regarding refills of specialty medication(s) [name] Maleate [facility])   Patient requested [name] at [facility] at [facility] date: 12/30/23   Medication will be filled on 12/30/23.  *Pending refill request.

## 2023-12-20 ENCOUNTER — Other Ambulatory Visit (HOSPITAL_COMMUNITY): Payer: Self-pay

## 2023-12-20 ENCOUNTER — Ambulatory Visit (HOSPITAL_BASED_OUTPATIENT_CLINIC_OR_DEPARTMENT_OTHER): Payer: PPO | Admitting: Anesthesiology

## 2023-12-20 ENCOUNTER — Encounter (HOSPITAL_BASED_OUTPATIENT_CLINIC_OR_DEPARTMENT_OTHER): Payer: Self-pay | Admitting: Plastic Surgery

## 2023-12-20 ENCOUNTER — Other Ambulatory Visit: Payer: Self-pay

## 2023-12-20 ENCOUNTER — Encounter (HOSPITAL_BASED_OUTPATIENT_CLINIC_OR_DEPARTMENT_OTHER)
Admission: RE | Disposition: A | Discharge status: Home / Self Care | Payer: Self-pay | Source: Home / Self Care | Attending: Plastic Surgery

## 2023-12-20 ENCOUNTER — Ambulatory Visit (HOSPITAL_BASED_OUTPATIENT_CLINIC_OR_DEPARTMENT_OTHER)
Admission: RE | Admit: 2023-12-20 | Discharge: 2023-12-20 | Disposition: A | Discharge status: Home / Self Care | Payer: PPO | Attending: Plastic Surgery | Admitting: Plastic Surgery

## 2023-12-20 DIAGNOSIS — Z923 Personal history of irradiation: Secondary | ICD-10-CM | POA: Insufficient documentation

## 2023-12-20 DIAGNOSIS — Z87891 Personal history of nicotine dependence: Secondary | ICD-10-CM | POA: Diagnosis not present

## 2023-12-20 DIAGNOSIS — Z9221 Personal history of antineoplastic chemotherapy: Secondary | ICD-10-CM | POA: Insufficient documentation

## 2023-12-20 DIAGNOSIS — D509 Iron deficiency anemia, unspecified: Secondary | ICD-10-CM

## 2023-12-20 DIAGNOSIS — I10 Essential (primary) hypertension: Secondary | ICD-10-CM | POA: Diagnosis not present

## 2023-12-20 DIAGNOSIS — J449 Chronic obstructive pulmonary disease, unspecified: Secondary | ICD-10-CM | POA: Insufficient documentation

## 2023-12-20 DIAGNOSIS — Z421 Encounter for breast reconstruction following mastectomy: Secondary | ICD-10-CM | POA: Insufficient documentation

## 2023-12-20 DIAGNOSIS — I2510 Atherosclerotic heart disease of native coronary artery without angina pectoris: Secondary | ICD-10-CM | POA: Diagnosis not present

## 2023-12-20 DIAGNOSIS — Z853 Personal history of malignant neoplasm of breast: Secondary | ICD-10-CM | POA: Insufficient documentation

## 2023-12-20 DIAGNOSIS — Z452 Encounter for adjustment and management of vascular access device: Secondary | ICD-10-CM | POA: Insufficient documentation

## 2023-12-20 DIAGNOSIS — Z9011 Acquired absence of right breast and nipple: Secondary | ICD-10-CM | POA: Insufficient documentation

## 2023-12-20 DIAGNOSIS — Z7982 Long term (current) use of aspirin: Secondary | ICD-10-CM | POA: Insufficient documentation

## 2023-12-20 HISTORY — PX: MASTOPEXY: SHX5358

## 2023-12-20 HISTORY — PX: PORT-A-CATH REMOVAL: SHX5289

## 2023-12-20 HISTORY — PX: REMOVAL OF TISSUE EXPANDER AND PLACEMENT OF IMPLANT: SHX6457

## 2023-12-20 SURGERY — REMOVAL, TISSUE EXPANDER, BREAST, WITH IMPLANT INSERTION
Anesthesia: General | Site: Chest | Laterality: Right

## 2023-12-20 MED ORDER — BUPIVACAINE HCL (PF) 0.5 % IJ SOLN
INTRAMUSCULAR | Status: AC
  Filled 2023-12-20: qty 60

## 2023-12-20 MED ORDER — DEXAMETHASONE SODIUM PHOSPHATE 10 MG/ML IJ SOLN
INTRAMUSCULAR | Status: AC
Start: 2023-12-20 — End: ?
  Filled 2023-12-20: qty 1

## 2023-12-20 MED ORDER — SUGAMMADEX SODIUM 200 MG/2ML IV SOLN
INTRAVENOUS | Status: DC | PRN
  Administered 2023-12-20: 140 mg via INTRAVENOUS

## 2023-12-20 MED ORDER — PHENYLEPHRINE HCL-NACL 20-0.9 MG/250ML-% IV SOLN
INTRAVENOUS | Status: AC
  Filled 2023-12-20: qty 250

## 2023-12-20 MED ORDER — LIDOCAINE HCL (CARDIAC) PF 100 MG/5ML IV SOSY
PREFILLED_SYRINGE | INTRAVENOUS | Status: DC | PRN
  Administered 2023-12-20: 60 mg via INTRAVENOUS

## 2023-12-20 MED ORDER — ACETAMINOPHEN 325 MG PO TABS: 325.0000 mg | ORAL_TABLET | ORAL | Status: DC | PRN

## 2023-12-20 MED ORDER — MIDAZOLAM HCL 5 MG/5ML IJ SOLN
INTRAMUSCULAR | Status: DC | PRN
  Administered 2023-12-20: 2 mg via INTRAVENOUS

## 2023-12-20 MED ORDER — DEXAMETHASONE SODIUM PHOSPHATE 10 MG/ML IJ SOLN
INTRAMUSCULAR | Status: DC | PRN
  Administered 2023-12-20: 10 mg via INTRAVENOUS

## 2023-12-20 MED ORDER — SCOPOLAMINE 1 MG/3DAYS TD PT72
MEDICATED_PATCH | TRANSDERMAL | Status: AC
Start: 2023-12-20 — End: ?
  Filled 2023-12-20: qty 1

## 2023-12-20 MED ORDER — CEFAZOLIN SODIUM-DEXTROSE 2-4 GM/100ML-% IV SOLN
2.0000 g | INTRAVENOUS | Status: AC
  Administered 2023-12-20: 2 g via INTRAVENOUS

## 2023-12-20 MED ORDER — ROCURONIUM BROMIDE 100 MG/10ML IV SOLN
INTRAVENOUS | Status: DC | PRN
  Administered 2023-12-20: 40 mg via INTRAVENOUS
  Administered 2023-12-20: 20 mg via INTRAVENOUS

## 2023-12-20 MED ORDER — PROPOFOL 10 MG/ML IV BOLUS
INTRAVENOUS | Status: AC
Start: 2023-12-20 — End: ?
  Filled 2023-12-20: qty 20

## 2023-12-20 MED ORDER — GABAPENTIN 300 MG PO CAPS
ORAL_CAPSULE | ORAL | Status: AC
Start: 2023-12-20 — End: ?
  Filled 2023-12-20: qty 1

## 2023-12-20 MED ORDER — LACTATED RINGERS IV SOLN: INTRAVENOUS | Status: DC

## 2023-12-20 MED ORDER — GABAPENTIN 300 MG PO CAPS
300.0000 mg | ORAL_CAPSULE | ORAL | Status: AC
  Administered 2023-12-20: 300 mg via ORAL

## 2023-12-20 MED ORDER — CEFAZOLIN SODIUM-DEXTROSE 2-4 GM/100ML-% IV SOLN
INTRAVENOUS | Status: AC
  Filled 2023-12-20: qty 100

## 2023-12-20 MED ORDER — OXYCODONE HCL 5 MG PO TABS: 5.0000 mg | ORAL_TABLET | Freq: Once | ORAL | Status: DC | PRN

## 2023-12-20 MED ORDER — PHENYLEPHRINE HCL-NACL 20-0.9 MG/250ML-% IV SOLN
INTRAVENOUS | Status: DC | PRN
  Administered 2023-12-20: 25 ug/min via INTRAVENOUS

## 2023-12-20 MED ORDER — ONDANSETRON HCL 4 MG/2ML IJ SOLN
INTRAMUSCULAR | Status: AC
Start: 2023-12-20 — End: ?
  Filled 2023-12-20: qty 2

## 2023-12-20 MED ORDER — ACETAMINOPHEN 160 MG/5ML PO SOLN: 325.0000 mg | ORAL | Status: DC | PRN

## 2023-12-20 MED ORDER — KETAMINE HCL 10 MG/ML IJ SOLN
INTRAMUSCULAR | Status: DC | PRN
  Administered 2023-12-20: 30 mg via INTRAVENOUS

## 2023-12-20 MED ORDER — LIDOCAINE 2% (20 MG/ML) 5 ML SYRINGE
INTRAMUSCULAR | Status: AC
Start: 2023-12-20 — End: ?
  Filled 2023-12-20: qty 5

## 2023-12-20 MED ORDER — ACETAMINOPHEN 10 MG/ML IV SOLN
1000.0000 mg | Freq: Once | INTRAVENOUS | Status: DC | PRN
Start: 2023-12-20 — End: 2023-12-20

## 2023-12-20 MED ORDER — FENTANYL CITRATE (PF) 100 MCG/2ML IJ SOLN: 25.0000 ug | INTRAMUSCULAR | Status: DC | PRN

## 2023-12-20 MED ORDER — PROPOFOL 10 MG/ML IV BOLUS
INTRAVENOUS | Status: DC | PRN
  Administered 2023-12-20: 130 mg via INTRAVENOUS

## 2023-12-20 MED ORDER — MIDAZOLAM HCL 2 MG/2ML IJ SOLN
INTRAMUSCULAR | Status: AC
  Filled 2023-12-20: qty 2

## 2023-12-20 MED ORDER — BUPIVACAINE HCL (PF) 0.5 % IJ SOLN
INTRAMUSCULAR | Status: DC | PRN
  Administered 2023-12-20: 5 mL
  Administered 2023-12-20: 15 mL
  Administered 2023-12-20: 10 mL

## 2023-12-20 MED ORDER — SODIUM CHLORIDE 0.9 % IV SOLN: INTRAVENOUS | Status: DC | PRN

## 2023-12-20 MED ORDER — FENTANYL CITRATE (PF) 100 MCG/2ML IJ SOLN
INTRAMUSCULAR | Status: DC | PRN
  Administered 2023-12-20 (×2): 50 ug via INTRAVENOUS

## 2023-12-20 MED ORDER — ACETAMINOPHEN 500 MG PO TABS
1000.0000 mg | ORAL_TABLET | ORAL | Status: AC
  Administered 2023-12-20: 1000 mg via ORAL

## 2023-12-20 MED ORDER — SODIUM CHLORIDE 0.9 % IV SOLN
INTRAVENOUS | Status: DC | PRN
  Administered 2023-12-20: 500 mL

## 2023-12-20 MED ORDER — SCOPOLAMINE 1 MG/3DAYS TD PT72
1.0000 | MEDICATED_PATCH | TRANSDERMAL | Status: DC
  Administered 2023-12-20: 1.5 mg via TRANSDERMAL

## 2023-12-20 MED ORDER — OXYCODONE HCL 5 MG/5ML PO SOLN: 5.0000 mg | Freq: Once | ORAL | Status: DC | PRN

## 2023-12-20 MED ORDER — ONDANSETRON HCL 4 MG/2ML IJ SOLN
INTRAMUSCULAR | Status: DC | PRN
  Administered 2023-12-20: 4 mg via INTRAVENOUS

## 2023-12-20 MED ORDER — NERATINIB MALEATE 40 MG PO TABS
240.0000 mg | ORAL_TABLET | Freq: Every day | ORAL | 0 refills | Status: DC
  Filled 2023-12-20: qty 180, 30d supply, fill #0

## 2023-12-20 MED ORDER — ACETAMINOPHEN 500 MG PO TABS
ORAL_TABLET | ORAL | Status: AC
Start: 2023-12-20 — End: ?
  Filled 2023-12-20: qty 2

## 2023-12-20 MED ORDER — KETAMINE HCL 50 MG/5ML IJ SOSY
PREFILLED_SYRINGE | INTRAMUSCULAR | Status: AC
  Filled 2023-12-20: qty 5

## 2023-12-20 MED ORDER — PROPOFOL 500 MG/50ML IV EMUL
INTRAVENOUS | Status: DC | PRN
  Administered 2023-12-20: 50 ug/kg/min via INTRAVENOUS

## 2023-12-20 MED ORDER — FENTANYL CITRATE (PF) 100 MCG/2ML IJ SOLN
INTRAMUSCULAR | Status: AC
  Filled 2023-12-20: qty 2

## 2023-12-20 MED ORDER — DROPERIDOL 2.5 MG/ML IJ SOLN: 0.6250 mg | Freq: Once | INTRAMUSCULAR | Status: DC | PRN

## 2023-12-20 SURGICAL SUPPLY — 55 items
BAG DECANTER FOR FLEXI CONT (MISCELLANEOUS) ×3 IMPLANT
BINDER BREAST LRG (GAUZE/BANDAGES/DRESSINGS) IMPLANT
BINDER BREAST MEDIUM (GAUZE/BANDAGES/DRESSINGS) IMPLANT
BINDER BREAST XLRG (GAUZE/BANDAGES/DRESSINGS) IMPLANT
BLADE SURG 10 STRL SS (BLADE) ×3 IMPLANT
BLADE SURG 15 STRL LF DISP TIS (BLADE) ×3 IMPLANT
BNDG GAUZE DERMACEA FLUFF 4 (GAUZE/BANDAGES/DRESSINGS) ×6 IMPLANT
BREAST SIZER 485CC (SIZER) IMPLANT
CANISTER SUCT 1200ML W/VALVE (MISCELLANEOUS) ×3 IMPLANT
CHLORAPREP W/TINT 26 (MISCELLANEOUS) ×3 IMPLANT
COVER BACK TABLE 60X90IN (DRAPES) ×3 IMPLANT
COVER MAYO STAND STRL (DRAPES) ×3 IMPLANT
DERMABOND ADVANCED .7 DNX12 (GAUZE/BANDAGES/DRESSINGS) ×6 IMPLANT
DRAIN CHANNEL 15F RND FF W/TCR (WOUND CARE) IMPLANT
DRAPE TOP ARMCOVERS (MISCELLANEOUS) ×3 IMPLANT
DRAPE U-SHAPE 76X120 STRL (DRAPES) ×3 IMPLANT
DRAPE UTILITY XL STRL (DRAPES) ×3 IMPLANT
ELECT BLADE 4.0 EZ CLEAN MEGAD (MISCELLANEOUS) IMPLANT
ELECT COATED BLADE 2.86 ST (ELECTRODE) ×3 IMPLANT
ELECT REM PT RETURN 9FT ADLT (ELECTROSURGICAL) ×3 IMPLANT
ELECTRODE BLDE 4.0 EZ CLN MEGD (MISCELLANEOUS) ×3 IMPLANT
ELECTRODE REM PT RTRN 9FT ADLT (ELECTROSURGICAL) ×3 IMPLANT
EVACUATOR SILICONE 100CC (DRAIN) IMPLANT
GAUZE PAD ABD 8X10 STRL (GAUZE/BANDAGES/DRESSINGS) ×6 IMPLANT
GLOVE BIO SURGEON STRL SZ 6 (GLOVE) ×6 IMPLANT
GLOVE BIO SURGEON STRL SZ 6.5 (GLOVE) IMPLANT
GOWN STRL REUS W/ TWL LRG LVL3 (GOWN DISPOSABLE) ×6 IMPLANT
IMPL BREAST P4.6XMDRT 14 485 (Breast) IMPLANT
IMPL BRST P4.6XMDRT 14 485CC (Breast) ×3 IMPLANT
MARKER SKIN DUAL TIP RULER LAB (MISCELLANEOUS) IMPLANT
NDL HYPO 25X1 1.5 SAFETY (NEEDLE) IMPLANT
NEEDLE HYPO 25X1 1.5 SAFETY (NEEDLE) ×3 IMPLANT
NS IRRIG 1000ML POUR BTL (IV SOLUTION) ×3 IMPLANT
PACK BASIN DAY SURGERY FS (CUSTOM PROCEDURE TRAY) ×3 IMPLANT
PENCIL SMOKE EVACUATOR (MISCELLANEOUS) ×3 IMPLANT
PIN SAFETY STERILE (MISCELLANEOUS) IMPLANT
SHEET MEDIUM DRAPE 40X70 STRL (DRAPES) ×6 IMPLANT
SIZER BRST STRL LF S 560CC (SIZER) IMPLANT
SLEEVE SCD COMPRESS KNEE MED (STOCKING) ×3 IMPLANT
SPONGE T-LAP 18X18 ~~LOC~~+RFID (SPONGE) ×6 IMPLANT
STAPLER SKIN PROX WIDE 3.9 (STAPLE) ×3 IMPLANT
SUT ETHILON 2 0 FS 18 (SUTURE) IMPLANT
SUT MNCRL AB 4-0 PS2 18 (SUTURE) ×3 IMPLANT
SUT PDS AB 2-0 CT2 27 (SUTURE) IMPLANT
SUT SILK 2 0 SH (SUTURE) IMPLANT
SUT VIC AB 3-0 PS1 18XBRD (SUTURE) ×3 IMPLANT
SUT VIC AB 3-0 SH 27X BRD (SUTURE) ×3 IMPLANT
SUT VIC AB 4-0 PS2 18 (SUTURE) ×3 IMPLANT
SYR BULB IRRIG 60ML STRL (SYRINGE) ×6 IMPLANT
SYR CONTROL 10ML LL (SYRINGE) ×3 IMPLANT
TAPE MEASURE VINYL STERILE (MISCELLANEOUS) ×3 IMPLANT
TOWEL GREEN STERILE FF (TOWEL DISPOSABLE) ×6 IMPLANT
TUBE CONNECTING 20X1/4 (TUBING) ×6 IMPLANT
UNDERPAD 30X36 HEAVY ABSORB (UNDERPADS AND DIAPERS) ×6 IMPLANT
YANKAUER SUCT BULB TIP NO VENT (SUCTIONS) ×3 IMPLANT

## 2023-12-20 NOTE — Anesthesia Procedure Notes (Signed)
Procedure Name: Intubation Date/Time: 12/20/2023 7:35 AM  Performed by: Thornell Mule, CRNAPre-anesthesia Checklist: Patient identified, Emergency Drugs available, Suction available and Patient being monitored Patient Re-evaluated:Patient Re-evaluated prior to induction Oxygen Delivery Method: Circle system utilized Preoxygenation: Pre-oxygenation with 100% oxygen Induction Type: IV induction Ventilation: Mask ventilation without difficulty Laryngoscope Size: Miller and 3 Grade View: Grade I Tube type: Oral Tube size: 7.0 mm Number of attempts: 1 Airway Equipment and Method: Stylet and Oral airway Placement Confirmation: ETT inserted through vocal cords under direct vision, positive ETCO2 and breath sounds checked- equal and bilateral Secured at: 21 cm Tube secured with: Tape Dental Injury: Teeth and Oropharynx as per pre-operative assessment

## 2023-12-20 NOTE — Interval H&P Note (Signed)
History and Physical Interval Note:  12/20/2023 6:50 AM  Ashley Robertson  has presented today for surgery, with the diagnosis of history breast cancer acquired absence breast history therapeutic radiation.  The various methods of treatment have been discussed with the patient and family. After consideration of risks, benefits and other options for treatment, the patient has consented to  Procedure(s): REMOVAL OF TISSUE EXPANDER AND PLACEMENT OF SILICONE IMPLANT (Right) MASTOPEXY (Left) REMOVAL PORT-A-CATH (Left) as a surgical intervention.  The patient's history has been reviewed, patient examined, no change in status, stable for surgery.  I have reviewed the patient's chart and labs.  Questions were answered to the patient's satisfaction.     Irean Hong Ashley Robertson

## 2023-12-20 NOTE — Op Note (Signed)
Operative Note   DATE OF OPERATION: 1.17.2025  LOCATION: [facility]-outpatient  SURGICAL DIVISION: Plastic Surgery  PREOPERATIVE DIAGNOSES:  1. History breast cacner 2. Acquired absence breast 3. History therapeutic radiation  POSTOPERATIVE DIAGNOSES:  same  PROCEDURE:  1. Removal left chest port. 2.Left breast mastopexy 3. Removal right chest tissue expander and placement silicone implant  SURGEON: Glenna Fellows MD MBA  ASSISTANT: none  ANESTHESIA:  General.   EBL: 100 ml  COMPLICATIONS: None immediate.   INDICATIONS FOR PROCEDURE:  The patient, Ashley Robertson, is a 68 y.o. female born on 04-Nov-1956, is here for staged breaset reconstruction following immediate prepectoral tissue expander based reconstruction.   FINDINGS: Complete incorporation ADM right chest noted. Left breast mastopexy 74 g. Natrelle Smooth Round Soft Touch Moderate projection 485 ml implant placed REF [id number]  DESCRIPTION OF PROCEDURE:  The patient's operative site was marked with the patient in the preoperative area including anterior axillary lines, chest midline, breast meridians. The location of new nipple areolar complex was marked on left by palpation through breast at level of inframammary fold. With aid of Wise pattern marker, location of new nipple areolar complex and vertical limbs (8 cm) were marked by displacement of breasts along meridian. The patient was taken to the operating room. SCDs were placed and IV antibiotics were given. The patient's operative site was prepped and draped in a sterile fashion. A time out was performed and all information was confirmed to be correct. Incision made in left chest scar over port. Suture associated with port removed and port removed intact. Closure completed with 3-0 vicryl in superficial fascia and dermis. 4-0 monocryl subcuticular skin closure completed. Incision made in right inframammary fold scar and carried through superficial fascia and  acellular dermis. Expander removed. Superior and lateral and inferior capsulotomies performed. Sizer placed.    Over left breast, in supine position lateral limbs for resection marked. Areola incised with at border of areola. Superior pedicle marked and de epithelialized. Pedicle developed to chest wall. Medial and lateral pillars elevated. Central lower pole breast tissue maintained on inferior pedicle dermoglandular pedicle. Caudal extent pedicle rotated beneath pedicle and secured to chest wall as auto augmentation flap with interrupted 2-0 PDS suture. Inferior dermoglandular pedicle also advanced superiorly beneath superior pedicle as auto augmentation flap and secured to chest wall with 2-0 PDS suture. Patient tailor tacked closed.   Patient brought to upright sitting position and assessed for symmetry. A Moderate Profile 485 ml implant selected for right chest. Patent returned to supine position. Left breast cavity irrigated and hemostasis obtained. Local anesthetic infiltrated. 15 Fr JP placed percutaneously and secured with 2-0 nylon. Closure completed with interrupted 2-0 PDS suture at T junction in dermis. Interrupted 3-0 vicryl used to approximate dermis along inframammary fold and vertical limb. NAC inset with 3-0 vicryl in dermis. Skin closure completed with 4-0 monocryl subcuticular throughout.   Right chest cavity irrigated with saline followed by betadine, Ancef, gentamicin solution. Hemostasis ensured.  The implant was placed in right chest. Implant orientation ensured. Closure completed with 3-0 vicryl to close superficial fascia and ADM over implant. 4-0 vicryl used to close dermis followed by 4-0 monocryl subcuticular.    Tissue adhesive applied to incisions. Dry dressing and breast binder applied. The patient was allowed to wake from anesthesia, extubated and taken to the recovery room in satisfactory condition.   The patient was allowed to wake from anesthesia, extubated and taken to  the recovery room in satisfactory condition.  SPECIMENS: left mastopexy  DRAINS: 15 Fr JP in left breast  Glenna Fellows, MD [facility]  Office/ physician access line after hours [phone]

## 2023-12-20 NOTE — Transfer of Care (Signed)
Immediate Anesthesia Transfer of Care Note  Patient: Ashley Robertson  Procedure(s) Performed: REMOVAL OF TISSUE EXPANDER AND PLACEMENT OF SILICONE IMPLANT (Right: Chest) MASTOPEXY (Left: Breast) REMOVAL PORT-A-CATH (Left: Chest)  Patient Location: PACU  Anesthesia Type:General  Level of Consciousness: drowsy, patient cooperative, and responds to stimulation  Airway & Oxygen Therapy: Patient Spontanous Breathing and Patient connected to face mask oxygen  Post-op Assessment: Report given to RN and Post -op Vital signs reviewed and stable  Post vital signs: Reviewed and stable  Last Vitals:  Vitals Value Taken Time  BP    Temp    Pulse    Resp    SpO2      Last Pain:  Vitals:   12/20/23 0624  TempSrc: Oral  PainSc: 0-No pain      Patients Stated Pain Goal: 3 (12/20/23 6295)  Complications: No notable events documented.

## 2023-12-20 NOTE — Anesthesia Postprocedure Evaluation (Signed)
Anesthesia Post Note  Patient: Ashley Robertson  Procedure(s) Performed: REMOVAL OF TISSUE EXPANDER AND PLACEMENT OF SILICONE IMPLANT (Right: Chest) MASTOPEXY (Left: Breast) REMOVAL PORT-A-CATH (Left: Chest)     Patient location during evaluation: PACU Anesthesia Type: General Level of consciousness: awake and alert Pain management: pain level controlled Vital Signs Assessment: post-procedure vital signs reviewed and stable Respiratory status: spontaneous breathing, nonlabored ventilation, respiratory function stable and patient connected to nasal cannula oxygen Cardiovascular status: blood pressure returned to baseline and stable Postop Assessment: no apparent nausea or vomiting Anesthetic complications: no  No notable events documented.  Last Vitals:  Vitals:   12/20/23 1030 12/20/23 1038  BP: 126/63 122/73  Pulse: (!) 57 61  Resp: 16 16  Temp:  (!) 36.3 C  SpO2: 91% 96%    Last Pain:  Vitals:   12/20/23 1038  TempSrc: Temporal  PainSc: 0-No pain                 Shelton Silvas

## 2023-12-20 NOTE — Discharge Instructions (Addendum)
  Post Anesthesia Home Care Instructions  Activity: Get plenty of rest for the remainder of the day. A responsible individual must stay with you for 24 hours following the procedure.  For the next 24 hours, DO NOT: -Drive a car -Advertising copywriter -Drink alcoholic beverages -Take any medication unless instructed by your physician -Make any legal decisions or sign important papers.  Meals: Start with liquid foods such as gelatin or soup. Progress to regular foods as tolerated. Avoid greasy, spicy, heavy foods. If nausea and/or vomiting occur, drink only clear liquids until the nausea and/or vomiting subsides. Call your physician if vomiting continues.  Special Instructions/Symptoms: Your throat may feel dry or sore from the anesthesia or the breathing tube placed in your throat during surgery. If this causes discomfort, gargle with warm salt water. The discomfort should disappear within 24 hours.  If you had a scopolamine patch placed behind your ear for the management of post- operative nausea and/or vomiting:  1. The medication in the patch is effective for 72 hours, after which it should be removed.  Wrap patch in a tissue and discard in the trash. Wash hands thoroughly with soap and water. 2. You may remove the patch earlier than 72 hours if you experience unpleasant side effects which may include dry mouth, dizziness or visual disturbances. 3. Avoid touching the patch. Wash your hands with soap and water after contact with the patch.     Tylenol can be taken after 12:30 in the afternoon if needed

## 2023-12-23 LAB — SURGICAL PATHOLOGY

## 2023-12-24 ENCOUNTER — Encounter (HOSPITAL_BASED_OUTPATIENT_CLINIC_OR_DEPARTMENT_OTHER): Payer: Self-pay | Admitting: Plastic Surgery

## 2023-12-24 ENCOUNTER — Ambulatory Visit: Payer: PPO | Admitting: Internal Medicine

## 2023-12-27 ENCOUNTER — Other Ambulatory Visit (HOSPITAL_COMMUNITY): Payer: Self-pay

## 2023-12-27 ENCOUNTER — Other Ambulatory Visit: Payer: Self-pay | Admitting: Pulmonary Disease

## 2023-12-27 MED ORDER — TRELEGY ELLIPTA 200-62.5-25 MCG/ACT IN AEPB
1.0000 | INHALATION_SPRAY | Freq: Every day | RESPIRATORY_TRACT | 2 refills | Status: DC
  Filled 2023-12-27: qty 60, 30d supply, fill #0
  Filled 2024-01-26: qty 60, 30d supply, fill #1
  Filled 2024-02-25: qty 60, 30d supply, fill #2

## 2023-12-30 ENCOUNTER — Other Ambulatory Visit (HOSPITAL_COMMUNITY): Payer: Self-pay

## 2023-12-30 ENCOUNTER — Other Ambulatory Visit: Payer: Self-pay

## 2024-01-03 ENCOUNTER — Telehealth: Payer: Self-pay | Admitting: Nurse Practitioner

## 2024-01-03 NOTE — Telephone Encounter (Signed)
 Marland Kitchen

## 2024-01-04 ENCOUNTER — Other Ambulatory Visit: Payer: Self-pay

## 2024-01-16 ENCOUNTER — Other Ambulatory Visit: Payer: Self-pay

## 2024-01-16 ENCOUNTER — Other Ambulatory Visit (HOSPITAL_COMMUNITY): Payer: Self-pay

## 2024-01-16 MED ORDER — MINOCYCLINE HCL 100 MG PO CAPS
100.0000 mg | ORAL_CAPSULE | Freq: Two times a day (BID) | ORAL | 0 refills | Status: AC
  Filled 2024-01-16: qty 60, 30d supply, fill #0

## 2024-01-16 MED ORDER — TACROLIMUS 0.1 % EX OINT
TOPICAL_OINTMENT | Freq: Two times a day (BID) | CUTANEOUS | 2 refills | Status: DC
Start: 2024-01-16 — End: 2024-03-12
  Filled 2024-01-16: qty 60, 30d supply, fill #0

## 2024-01-21 ENCOUNTER — Other Ambulatory Visit: Payer: Self-pay

## 2024-01-21 ENCOUNTER — Other Ambulatory Visit: Payer: Self-pay | Admitting: Hematology

## 2024-01-21 DIAGNOSIS — C50911 Malignant neoplasm of unspecified site of right female breast: Secondary | ICD-10-CM

## 2024-01-21 MED ORDER — NERATINIB MALEATE 40 MG PO TABS
240.0000 mg | ORAL_TABLET | Freq: Every day | ORAL | 0 refills | Status: DC
Start: 2024-01-21 — End: 2024-02-11
  Filled 2024-01-21: qty 180, 30d supply, fill #0

## 2024-01-21 NOTE — Progress Notes (Signed)
Specialty Pharmacy Refill Coordination Note  Ashley Robertson is a 68 y.o. female contacted today regarding refills of specialty medication(s) [name] Maleate [facility])   Patient requested [name] at [facility] [place] date: 01/29/24   Medication will be filled on 01/28/24, pending refill approval.   This fill date is pending response to refill request from provider. Patient is aware and if they have not received fill by intended date they must follow up with pharmacy.

## 2024-01-22 ENCOUNTER — Other Ambulatory Visit: Payer: Self-pay

## 2024-01-22 ENCOUNTER — Other Ambulatory Visit: Payer: Self-pay | Admitting: Internal Medicine

## 2024-01-23 ENCOUNTER — Other Ambulatory Visit (HOSPITAL_COMMUNITY): Payer: Self-pay

## 2024-01-23 MED ORDER — EZETIMIBE 10 MG PO TABS
10.0000 mg | ORAL_TABLET | Freq: Every day | ORAL | 2 refills | Status: AC
  Filled 2024-01-23 – 2024-01-30 (×2): qty 90, 90d supply, fill #0
  Filled 2024-05-02: qty 90, 90d supply, fill #1

## 2024-01-23 NOTE — Therapy (Signed)
 OUTPATIENT PHYSICAL THERAPY SOZO SCREENING NOTE   Patient Name: Ashley Robertson MRN: [id number] DOB:October 15, 1956, 68 y.o., female Today's Date: 01/27/2024  PCP: Gweneth Dimitri, MD REFERRING PROVIDER: Emelia Loron, MD   PT End of Session - 01/27/24 0940     Visit Number 2   unchanged due to screen only   PT Start Time 0940    PT Stop Time 0944    PT Time Calculation (min) 4 min              Past Medical History:  Diagnosis Date   Allergy    Lisinopril and lipitor   Anxiety and depression    Aortic atherosclerosis (HCC) 03/2020   CT Chest: 2 V (LAD & LCx) Coronary Atherosclerosis, Aortic Atherosclerosis (no aneurysm).  Mild centrilobular emphysema with mild diffuse bronchial thickening; several scattered small solitary pulmonary nodules (largest 5.6 mm in anterior left upper lobe)   Breast cancer (HCC) 12/2021   Right breast ILC   Carotid artery plaque, bilateral 10/2015   Mild to moderate plaque L>R without significant stenosis   COPD (chronic obstructive pulmonary disease) (HCC)    Coronary Artery Calcification - Score 79    Coronary Calcium Score 79.  LAD and circumflex calcification noted.  Normal ascending aorta with mild calcification.   Current every day smoker    pt quit smoking April 2023   Dysrhythmia 01/2023   developed post op a-fib after AAA repair, converted with amiodarone   Emphysema lung (HCC)    Noted on chest CT   Family history of breast cancer 02/21/2022   Family history of prostate cancer 02/21/2022   Hyperlipidemia    Hypertension    Controlled with amlodipine   Prediabetes    Skin cancer 2021   Remove 2022   Past Surgical History:  Procedure Laterality Date   ABDOMINAL AORTIC ANEURYSM REPAIR N/A 01/11/2023   Procedure: ANEURYSM ABDOMINAL AORTIC REPAIR;  Surgeon: Nada Libman, MD;  Location: [facility] OR;  Service: Vascular;  Laterality: N/A;   BREAST BIOPSY Right    times 3   BREAST RECONSTRUCTION WITH PLACEMENT OF TISSUE EXPANDER AND  ALLODERM Right 07/26/2022   Procedure: RIGHT BREAST RECONSTRUCTION WITH PLACEMENT OF TISSUE EXPANDER AND ALLODERM;  Surgeon: Glenna Fellows, MD;  Location: [facility] OR;  Service: Plastics;  Laterality: Right;   CESAREAN SECTION  1990, 1991   COSMETIC SURGERY  1981   Rhinoplasty   MASTECTOMY Right    MASTECTOMY W/ SENTINEL NODE BIOPSY Right 07/26/2022   Procedure: RIGHT MASTECTOMY, RIGHT AXILLARY SENTINEL NODE BIOPSY;  Surgeon: Emelia Loron, MD;  Location: [facility] OR;  Service: General;  Laterality: Right;  GEN & PEC BLOCK   MASTOPEXY Left 12/20/2023   Procedure: MASTOPEXY;  Surgeon: Glenna Fellows, MD;  Location: [facility];  Service: Plastics;  Laterality: Left;   PORT-A-CATH REMOVAL Left 12/20/2023   Procedure: REMOVAL PORT-A-CATH;  Surgeon: Glenna Fellows, MD;  Location: [facility];  Service: Plastics;  Laterality: Left;   PORTACATH PLACEMENT Left 03/06/2022   Procedure: INSERTION PORT-A-CATH;  Surgeon: Emelia Loron, MD;  Location: [facility];  Service: General;  Laterality: Left;   RADIOACTIVE SEED GUIDED AXILLARY SENTINEL LYMPH NODE Right 07/26/2022   Procedure: RADIOACTIVE SEED GUIDED AXILLARY SENTINEL LYMPH NODE DISSECTION;  Surgeon: Emelia Loron, MD;  Location: [facility] OR;  Service: General;  Laterality: Right;   REMOVAL OF TISSUE EXPANDER AND PLACEMENT OF IMPLANT Right 12/20/2023   Procedure: REMOVAL OF TISSUE EXPANDER AND PLACEMENT OF SILICONE IMPLANT;  Surgeon: Glenna Fellows, MD;  Location: [facility];  Service: Plastics;  Laterality: Right;   RHINOPLASTY  1981   TONSILLECTOMY  1978   TRANSTHORACIC ECHOCARDIOGRAM  10/2016   EF 55 to 60%.  Normal systolic and diastolic function.  No ASD/PFO   TUBAL LIGATION  1991   Patient Active Problem List   Diagnosis Date Noted   AAA (abdominal aortic aneurysm) (HCC) 01/11/2023   S/P AAA repair 01/11/2023   Osteopenia 10/21/2022   Aortic atherosclerosis (HCC) 08/10/2022    S/P mastectomy, right 07/26/2022   Carotid atherosclerosis 03/28/2022   Coronary artery disease 03/28/2022   Pulmonary emphysema (HCC) 03/28/2022   Recurrent major depression in remission (HCC) 03/28/2022   Port-A-Cath in place 03/27/2022   Genetic testing 03/12/2022   Family history of breast cancer 02/21/2022   Family history of prostate cancer 02/21/2022   Hypercholesterolemia 02/21/2022   Malignant neoplasm of overlapping sites of right breast in female, estrogen receptor positive (HCC) 02/16/2022   Bilateral carotid bruits 04/26/2020   Essential hypertension 03/19/2020   Hyperlipidemia with target LDL less than 100 03/19/2020   Prediabetes 03/19/2020   Agatston CAC score, <100 03/15/2020    REFERRING DIAG: Rt breast cancer at risk for lymphedema  THERAPY DIAG: Malignant neoplasm of upper-outer quadrant of right breast in female, estrogen receptor positive (HCC)  PERTINENT HISTORY: Patient was diagnosed on 02/08/2022 with right grade II invasive lobular carcinoma breast cancer. It measured 2.6 cm and 3.3 cm (2 masses) and is located in the upper outer quadrant. It is ER positive, PR negative, and HER2 positive with a Ki67 of 10%. Patient has COPD and hypertension and a 5cm AAA. She is-s/p right mastectomy  with SLNB 07/26/2022 by Dr. Dwain Sarna and reconstruction by Dr. Leta Baptist. Path showed 9.5 cm invasive lobular carcinoma involving dermis of nipple, margins negative, 2/5 positive lymph nodes. Given the significant residual disease, the recommendation is for postmastectomy radiation and switch to adjuvant Kadcyla. 12/20/23 - Reconstruction, implant on R and reduction with left on L  PRECAUTIONS: right UE Lymphedema risk, None  SUBJECTIVE: Pt returns for her 3 month L-Dex screen.   PAIN:  Are you having pain? No  SOZO SCREENING: Patient was assessed today using the SOZO machine to determine the lymphedema index score. This was compared to her baseline score. It was determined that  she is within the recommended range when compared to her baseline and no further action is needed at this time. She will continue SOZO screenings. These are done every 3 months for 2 years post operatively followed by every 6 months for 2 years, and then annually.    L-DEX FLOWSHEETS - 01/27/24 0900       L-DEX LYMPHEDEMA SCREENING   Measurement Type Unilateral    L-DEX MEASUREMENT EXTREMITY Upper Extremity    POSITION  Standing    DOMINANT SIDE Right    At Risk Side Right    BASELINE SCORE (UNILATERAL) -0.2    L-DEX SCORE (UNILATERAL) 4    VALUE CHANGE (UNILAT) 4.2                [employer], PT 01/27/2024, 9:45 AM

## 2024-01-24 ENCOUNTER — Ambulatory Visit: Payer: PPO | Admitting: Internal Medicine

## 2024-01-27 ENCOUNTER — Ambulatory Visit: Payer: PPO | Attending: General Surgery | Admitting: Physical Therapy

## 2024-01-27 DIAGNOSIS — Z170 Estrogen receptor positive status [ER+]: Secondary | ICD-10-CM | POA: Insufficient documentation

## 2024-01-27 DIAGNOSIS — C50411 Malignant neoplasm of upper-outer quadrant of right female breast: Secondary | ICD-10-CM | POA: Insufficient documentation

## 2024-01-28 ENCOUNTER — Other Ambulatory Visit: Payer: Self-pay

## 2024-01-29 ENCOUNTER — Encounter (HOSPITAL_COMMUNITY): Payer: Self-pay

## 2024-01-30 ENCOUNTER — Other Ambulatory Visit (HOSPITAL_COMMUNITY): Payer: Self-pay

## 2024-02-05 ENCOUNTER — Ambulatory Visit: Payer: PPO | Attending: Internal Medicine | Admitting: Internal Medicine

## 2024-02-05 ENCOUNTER — Encounter: Payer: Self-pay | Admitting: Internal Medicine

## 2024-02-05 VITALS — BP 126/62 | HR 58 | Ht 69.0 in | Wt 144.8 lb

## 2024-02-05 DIAGNOSIS — I2510 Atherosclerotic heart disease of native coronary artery without angina pectoris: Secondary | ICD-10-CM | POA: Diagnosis not present

## 2024-02-05 DIAGNOSIS — I700 Atherosclerosis of aorta: Secondary | ICD-10-CM

## 2024-02-05 DIAGNOSIS — I10 Essential (primary) hypertension: Secondary | ICD-10-CM | POA: Diagnosis not present

## 2024-02-05 DIAGNOSIS — E7800 Pure hypercholesterolemia, unspecified: Secondary | ICD-10-CM

## 2024-02-05 DIAGNOSIS — I2584 Coronary atherosclerosis due to calcified coronary lesion: Secondary | ICD-10-CM

## 2024-02-05 DIAGNOSIS — I6529 Occlusion and stenosis of unspecified carotid artery: Secondary | ICD-10-CM

## 2024-02-05 NOTE — Patient Instructions (Addendum)
 Medication Instructions:  Your physician recommends that you continue on your current medications as directed. Please refer to the Current Medication list given to you today.  *If you need a refill on your cardiac medications before your next appointment, please call your pharmacy*  Tests: Please schedule in April 2025 Your physician has requested that you have an echocardiogram. Echocardiography is a painless test that uses sound waves to create images of your heart. It provides your doctor with information about the size and shape of your heart and how well your heart's chambers and valves are working. This procedure takes approximately one hour. There are no restrictions for this procedure. Please do NOT wear cologne, perfume, aftershave, or lotions (deodorant is allowed). Please arrive 15 minutes prior to your appointment time.  Follow-Up: At [facility], you and your health needs are our priority.  As part of our continuing mission to provide you with exceptional heart care, we have created designated Provider Care Teams.  These Care Teams include your primary Cardiologist (physician) and Advanced Practice Providers (APPs -  Physician Assistants and Nurse Practitioners) who all work together to provide you with the care you need, when you need it.   Your next appointment:   6 month(s)  Provider:   Dr. Clearnce Hasten   Other Instructions

## 2024-02-05 NOTE — Progress Notes (Signed)
 Cardiology Office Note:    Date:  02/05/2024   ID:  Ashley Robertson, DOB [birth year], MRN [id number]  PCP:  Gweneth Dimitri, MD   [facility] Providers Cardiologist:  Bryan Lemma, MD     Referring MD: Gweneth Dimitri, MD   No chief complaint on file. Cardio-Oncology Clinic- on cardiotoxic cancer therapy  History of Present Illness:    Ashley Robertson is a 68 y.o. female with a hx of HER2+, ER+ R breast cancer diagnosed 01/2022, on Her2 therapy since 03/2022 s/p mastemctomy, XRT 09/11/2022-10/24/2022, HTN, referral to cardio-onc clinic from Dr. Mosetta Putt. She is doing well on her2. Her blood pressure has been elevated. Her last echo read as EF decline 50-55, looks closer to 55-60.  Her GLS was normal. I recommended started carvedilol for cardioprotection. Recommended to continue her2 therapy.   She was following Dr. Herbie Baltimore for elevated CAC < 100. LDL goal <70. She saw Juanda Crumble. Dicussed infrarenal AA 5.4 x5.3 cm with a large burden of eccentric mural thrombus in April 2023, repeat shows infrarenal AA up to 5.7 cm, she is followed by vascular sx, she is planned for open repair.  No cardiac disease hx. She denies chest pain or SOB. No orthopnea/PND. No LE edema  She has grandkids, she is active with them.   Father had hypertension. Mother is healthy. Sister with hypertension. Brother had an MI while undergoing surgery for UC  The 10-year ASCVD risk score (Arnett DK, et al., 2019) is: 8.4%   Values used to calculate the score:     Age: 40 years     Sex: Female     Is Non-Hispanic African American: No     Diabetic: No     Tobacco smoker: No     Systolic Blood Pressure: 126 mmHg     Is BP treated: Yes     HDL Cholesterol: 87 mg/dL     Total Cholesterol: 181 mg/dL   EKG 07/06/6961- NSR  Interim hx 02/13/2023 She underwent AAA repair. She developed postop afib. She was started on eliquis. She converted to sinus with amiodarone. She was seen by Dr. Flora Lipps  She denies  angina, dyspnea on exertion, lower extremity edema, PND or orthopnea. No LH  Interim hx 07/03/2023 She is taking care of her grand childiren. No palpitations. No chest pain or SOB. Her therapy has gone well.   Interim 02/05/2024 She denies Cp, SOB, or palpitations   Past Medical History:  Diagnosis Date   Allergy    Lisinopril and lipitor   Anxiety and depression    Aortic atherosclerosis (HCC) 03/2020   CT Chest: 2 V (LAD & LCx) Coronary Atherosclerosis, Aortic Atherosclerosis (no aneurysm).  Mild centrilobular emphysema with mild diffuse bronchial thickening; several scattered small solitary pulmonary nodules (largest 5.6 mm in anterior left upper lobe)   Breast cancer (HCC) 12/2021   Right breast ILC   Carotid artery plaque, bilateral 10/2015   Mild to moderate plaque L>R without significant stenosis   COPD (chronic obstructive pulmonary disease) (HCC)    Coronary Artery Calcification - Score 79    Coronary Calcium Score 79.  LAD and circumflex calcification noted.  Normal ascending aorta with mild calcification.   Current every day smoker    pt quit smoking April 2023   Dysrhythmia 01/2023   developed post op a-fib after AAA repair, converted with amiodarone   Emphysema lung (HCC)    Noted on chest CT   Family history of breast cancer 02/21/2022  Family history of prostate cancer 02/21/2022   Hyperlipidemia    Hypertension    Controlled with amlodipine   Prediabetes    Skin cancer 2021   Remove 2022    Past Surgical History:  Procedure Laterality Date   ABDOMINAL AORTIC ANEURYSM REPAIR N/A 01/11/2023   Procedure: ANEURYSM ABDOMINAL AORTIC REPAIR;  Surgeon: Nada Libman, MD;  Location: [facility] OR;  Service: Vascular;  Laterality: N/A;   BREAST BIOPSY Right    times 3   BREAST RECONSTRUCTION WITH PLACEMENT OF TISSUE EXPANDER AND ALLODERM Right 07/26/2022   Procedure: RIGHT BREAST RECONSTRUCTION WITH PLACEMENT OF TISSUE EXPANDER AND ALLODERM;  Surgeon: Glenna Fellows, MD;   Location: [facility] OR;  Service: Plastics;  Laterality: Right;   CESAREAN SECTION  1990, 1991   COSMETIC SURGERY  1981   Rhinoplasty   MASTECTOMY Right    MASTECTOMY W/ SENTINEL NODE BIOPSY Right 07/26/2022   Procedure: RIGHT MASTECTOMY, RIGHT AXILLARY SENTINEL NODE BIOPSY;  Surgeon: Emelia Loron, MD;  Location: [facility] OR;  Service: General;  Laterality: Right;  GEN & PEC BLOCK   MASTOPEXY Left 12/20/2023   Procedure: MASTOPEXY;  Surgeon: Glenna Fellows, MD;  Location: [facility];  Service: Plastics;  Laterality: Left;   PORT-A-CATH REMOVAL Left 12/20/2023   Procedure: REMOVAL PORT-A-CATH;  Surgeon: Glenna Fellows, MD;  Location: [facility];  Service: Plastics;  Laterality: Left;   PORTACATH PLACEMENT Left 03/06/2022   Procedure: INSERTION PORT-A-CATH;  Surgeon: Emelia Loron, MD;  Location: [facility];  Service: General;  Laterality: Left;   RADIOACTIVE SEED GUIDED AXILLARY SENTINEL LYMPH NODE Right 07/26/2022   Procedure: RADIOACTIVE SEED GUIDED AXILLARY SENTINEL LYMPH NODE DISSECTION;  Surgeon: Emelia Loron, MD;  Location: [facility];  Service: General;  Laterality: Right;   REMOVAL OF TISSUE EXPANDER AND PLACEMENT OF IMPLANT Right 12/20/2023   Procedure: REMOVAL OF TISSUE EXPANDER AND PLACEMENT OF SILICONE IMPLANT;  Surgeon: Glenna Fellows, MD;  Location: [facility];  Service: Plastics;  Laterality: Right;   RHINOPLASTY  1981   TONSILLECTOMY  1978   TRANSTHORACIC ECHOCARDIOGRAM  10/2016   EF 55 to 60%.  Normal systolic and diastolic function.  No ASD/PFO   TUBAL LIGATION  1991    Current Medications: Current Outpatient Medications on File Prior to Visit  Medication Sig Dispense Refill   albuterol (PROVENTIL HFA) 108 (90 Base) MCG/ACT inhaler Inhale 1 to 2 puffs into the lungs every 6 hours as needed 8.5 g 2   alendronate (FOSAMAX) 70 MG tablet Take 1 tablet by mouth once weekly --Take 30 minutes before the first  food, beverage or medicine of the day with plain water 12 tablet 3   amLODipine (NORVASC) 10 MG tablet Take 1 tablet (10 mg total) by mouth at bedtime. 90 tablet 1   anastrozole (ARIMIDEX) 1 MG tablet Take 1 tablet (1 mg) by mouth daily. 90 tablet 3   aspirin 81 MG tablet Take 81 mg by mouth at bedtime.     Calcium Carb-Cholecalciferol (CALCIUM 500 + D3 PO) Take 1 tablet by mouth daily.     carvedilol (COREG) 6.25 MG tablet Take 1 tablet (6.25 mg total) by mouth 2 (two) times daily. 180 tablet 3   Cholecalciferol (VITAMIN D) 50 MCG (2000 UT) CAPS Take 1 capsule by mouth daily. 30 capsule 2   doxycycline (VIBRAMYCIN) 100 MG capsule Take 1 capsule (100 mg total) by mouth 2 (two) times daily. Take with 8 oz water. Do not lie down for  at least 30 minutes after. For use following surgery 14 capsule 0   ezetimibe (ZETIA) 10 MG tablet Take 1 tablet (10 mg total) by mouth daily. 90 tablet 2   ferrous sulfate 325 (65 FE) MG EC tablet Take 325 mg by mouth 3 (three) times daily with meals.     FLUoxetine (PROZAC) 20 MG capsule Take 1 capsule (20 mg total) by mouth daily. 90 capsule 3   Fluticasone-Umeclidin-Vilant (TRELEGY ELLIPTA) 200-62.5-25 MCG/ACT AEPB Inhale 1 puff into the lungs daily. 60 each 2   methocarbamol (ROBAXIN) 500 MG tablet Take 1 tablet (500 mg total) by mouth 3 (three) times daily as needed for muscle spasms. 5 tablet 0   minocycline (MINOCIN) 100 MG capsule Take 1 capsule (100 mg total) by mouth 2 (two) times daily. 60 capsule 0   [name] Maleate (NERLYNX) 40 MG tablet Take 6 tablets (240 mg total) by mouth daily. Take with food. 180 tablet 0   prenatal vitamin w/FE, FA (PRENATAL 1 + 1) 27-1 MG TABS tablet Take 1 tablet by mouth daily at 12 noon.     rosuvastatin (CRESTOR) 5 MG tablet Take 1 tablet by mouth daily at bedtime ,except on Monday, Wednesday and Fridays take 2 tablets at bedtime as directed 180 tablet 0   Specialty Vitamins Products (MAGNESIUM, AMINO ACID CHELATE,) 133 MG  tablet Take 1 tablet by mouth at bedtime.     tacrolimus (PROTOPIC) 0.1 % ointment Apply to affected area 1-2 times daily x2-4 weeks then as needed for flares 60 g 2   triamcinolone (KENALOG) 0.025 % cream Apply 1 Application topically 2 (two) times daily to rash around eyes for 3-4 weeks, then as needed for flares; not safe for long term use. 15 g 2   [DISCONTINUED] hydrochlorothiazide (HYDRODIURIL) 25 MG tablet Take 1 tablet (25 mg total) by mouth daily. (Patient not taking: Reported on 02/05/2024) 30 tablet 6   [DISCONTINUED] lisinopril (ZESTRIL) 5 MG tablet Take 1 tablet (5 mg total) by mouth daily. (Patient not taking: Reported on 02/05/2024) 30 tablet 1   No current facility-administered medications on file prior to visit.    Allergies:   Lisinopril and Lipitor [atorvastatin]   Social History   Socioeconomic History   Marital status: Married    Spouse name: Not on file   Number of children: 2   Years of education: Not on file   Highest education level: Bachelor's degree (e.g., BA, AB, BS)  Occupational History   Occupation: OR Academic librarian: [employer]    Comment: Retired  Tobacco Use   Smoking status: Former    Current packs/day: 0.00    Average packs/day: 1 pack/day for 40.0 years (40.0 ttl pk-yrs)    Types: Cigarettes    Start date: 04/01/1982    Quit date: 04/01/2022    Years since quitting: 1.8   Smokeless tobacco: Never  Vaping Use   Vaping status: Former   Devices: zero nicotine vape  Substance and Sexual Activity   Alcohol use: Yes    Alcohol/week: 6.0 standard drinks of alcohol    Types: 6 Glasses of wine per week    Comment: social 4-6 glasses wine weekly per pt   Drug use: No   Sexual activity: Not Currently    Birth control/protection: Post-menopausal, Surgical    Comment: BTL  Other Topics Concern   Not on file  Social History Narrative   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  She  used to work at [employer].     Has 2 daughters.   Quit smoking 04/01/22.   Drinks 4 to 6 cups of coffee a day.      Currently trying Intermittent Fasting diet for weight loss.  Does not routinely exercise.   Social Drivers of Corporate investment banker Strain: Low Risk  (02/21/2022)   Overall Financial Resource Strain (CARDIA)    Difficulty of Paying Living Expenses: Not hard at all  Food Insecurity: No Food Insecurity (01/12/2023)   Hunger Vital Sign    Worried About Running Out of Food in the Last Year: Never true    Ran Out of Food in the Last Year: Never true  Transportation Needs: No Transportation Needs (01/12/2023)   PRAPARE - Administrator, Civil Service (Medical): No    Lack of Transportation (Non-Medical): No  Physical Activity: Not on file  Stress: Not on file  Social Connections: Not on file     Family History: The patient's family history includes Breast cancer in her niece and paternal aunt; Cancer - Other in her father; Healthy in her brother, brother, and sister; Hyperlipidemia in her father; Hypertension in her father and sister; Liver cancer in her maternal grandfather; Lung cancer in her brother and maternal grandmother; Parkinson's disease in her father; Prostate cancer in her father; Skin cancer in her maternal aunt; Transient ischemic attack (age of onset: 59) in her mother.  ROS:   Please see the history of present illness.     All other systems reviewed and are negative.  EKGs/Labs/Other Studies Reviewed:    The following studies were reviewed today:  TTE: 10/29/2016- EF 55-60%, no valve disease 03/05/2022-EF 55-60%, GLS attempted 07/04/2022- EF nl, GLS -14.7% 09/19/2022- EF nl 12/19/2022-  EF estimated 50-55, challenging windows, looks closer to 55-60. GLS -19% normal  03/24/2020- coronary CTA- CAC 79 , 80th percentile, LAD and Lcx CAC  EKG:     Recent Labs: 12/18/2023: ALT 24; BUN 14; Creatinine 0.74; Hemoglobin 12.4; Platelet Count  105; Potassium 4.3; Sodium 135   Recent Lipid Panel    Component Value Date/Time   CHOL 181 01/02/2023 0830   TRIG 85 01/02/2023 0830   HDL 87 01/02/2023 0830   CHOLHDL 2.1 01/02/2023 0830   LDLCALC 79 01/02/2023 0830     Risk Assessment/Calculations:    CHA2DS2-VASc Score = 3   This indicates a 3.2% annual risk of stroke. The patient's score is based upon: CHF History: 0 HTN History: 1 Diabetes History: 0 Stroke History: 0 Vascular Disease History: 0 Age Score: 1 Gender Score: 1      Physical Exam:    VS:  Vitals:   02/05/24 0853  BP: 126/62  Pulse: (!) 58  SpO2: 96%     Wt Readings from Last 3 Encounters:  02/05/24 144 lb 12.8 oz (65.7 kg)  12/20/23 142 lb (64.4 kg)  11/12/23 143 lb (64.9 kg)     GEN:  Well nourished, well developed in no acute distress HEENT: Normal NECK: No JVD; No carotid bruits LYMPHATICS: No lymphadenopathy CARDIAC: RRR, no murmurs, rubs, gallops RESPIRATORY:  Clear to auscultation without rales, wheezing or rhonchi  ABDOMEN: Soft, non-tender, non-distended. Well healed incision MUSCULOSKELETAL:  No edema; No deformity  SKIN: Warm and dry NEUROLOGIC:  Alert and oriented x 3 PSYCHIATRIC:  Normal affect   ASSESSMENT:   Cardio Onc: on her 2 since 03/2022-planned until September, 2024 (switched  therapy in September 2023; was on pertuzumab and trastuzumab without great response to therapy; changed to Kadcyla in September). Started cardioprotective agent for HTN, coreg in January 2024. GLS is normal. EF is preserved.  - She can continue her2 therapy.  - Baseline BNP was 61 - her LVEF and strain have been normal; last echo 11/18/2023 - she is planned for neratinib (HER2/TKI) until August of 2025  Post-Afib: discussed risk/benefits of stopping AC. We decided that she can stop eliquis, but she can let us know if she develops symptoms of afib - continue asa 81 mg daily - s/p eliquis with chemical conversion - s/p  amiodarone  Thrombocytopenia - stable. Plt goal >50 for [facility] if needed  Elevated CAC:  - continue asa 81 mg daily -continue crestor 5 mg (does 10 mg some days).  - cont zetia 10 mg daily - LDL near goal, she would like to avoid increased statin or PCSK9   HTN: well controlled. Continue coreg 6.25 mg BID and norvasc 10 mg daily. BP goal <130/80 mmHg  Infrarenal AA: up to 5.7 cm 12/21/2022; followed by vascular sx. She is s/p open repair 01/11/2023.  She is now s/p open AAA repair 2/2 juctarenal 5.7cm AAA on 2.9.2024.  Smoking: stopped smoking breast cancer diagnosis  PLAN:    In order of problems listed above:   Plan for limited echo LVEF and GLS in April Follow up 6 months with Dr. Elwyn Lade      Medication Adjustments/Labs and Tests Ordered: Current medicines are reviewed at length with the patient today.  Concerns regarding medicines are outlined above.  Orders Placed This Encounter  Procedures   EKG 12-Lead   No orders of the defined types were placed in this encounter.   There are no Patient Instructions on file for this visit.   Signed, Maisie Fus, MD  02/05/2024 8:59 AM    [facility]

## 2024-02-10 ENCOUNTER — Other Ambulatory Visit: Payer: Self-pay

## 2024-02-10 DIAGNOSIS — C50811 Malignant neoplasm of overlapping sites of right female breast: Secondary | ICD-10-CM

## 2024-02-10 NOTE — Assessment & Plan Note (Signed)
-  Her most recent DEXA was on 04/29/20 showing osteopenia (T-score of -2.1 at L1-3). -continue calcium and vitD  -We discussed the negative impact on bone density from aromatase inhibitor -I previously recommended her to consider Zometa infusion every 6 months for 2 years, benefit and side effect discussed with her. She will think about it    

## 2024-02-10 NOTE — Assessment & Plan Note (Signed)
 invasive lobular carcinoma, Stage IIIA, c(T3, N1), yp(T3, N1a), ER+/PR-/HER2+, Grade 2  -Diagnosed in 01/2022 -s/p neoadjuvant chemo TCHP X6, followed by right mastectomy by Dr. Dwain Sarna and reconstruction by Dr. Leta Baptist. Unfortunately she did not have much response to neoadjvuant chemo  -on adjuvant Kadcyla now, tolerating well with no noticeable side effects. Plan for a total of 14 treatment -she received postmastectomy radiation under Dr. Mitzi Hansen, 10/10 - 10/29/22. -she started adjuvant anastrozole in January 2024, she is tolerating very well -Echocardiogram in January 2024 showed slightly decreased EF 50 to 55%.  She was seen by cardiologist after echo, her cardiac medication was adjusted, and we will continue HER2 antibody.  repeated echo on 03/18/23 showed normal EF 55%. She finished Kadcyla in 06/2023

## 2024-02-11 ENCOUNTER — Inpatient Hospital Stay: Payer: PPO | Attending: Hematology

## 2024-02-11 ENCOUNTER — Other Ambulatory Visit: Payer: Self-pay

## 2024-02-11 ENCOUNTER — Other Ambulatory Visit (HOSPITAL_COMMUNITY): Payer: Self-pay

## 2024-02-11 ENCOUNTER — Inpatient Hospital Stay (HOSPITAL_BASED_OUTPATIENT_CLINIC_OR_DEPARTMENT_OTHER): Payer: PPO | Admitting: Hematology

## 2024-02-11 ENCOUNTER — Encounter: Payer: Self-pay | Admitting: Hematology

## 2024-02-11 VITALS — BP 140/70 | HR 66 | Temp 97.6°F | Resp 21 | Ht 69.0 in | Wt 145.3 lb

## 2024-02-11 DIAGNOSIS — C50911 Malignant neoplasm of unspecified site of right female breast: Secondary | ICD-10-CM

## 2024-02-11 DIAGNOSIS — Z7982 Long term (current) use of aspirin: Secondary | ICD-10-CM | POA: Insufficient documentation

## 2024-02-11 DIAGNOSIS — Z1731 Human epidermal growth factor receptor 2 positive status: Secondary | ICD-10-CM | POA: Diagnosis not present

## 2024-02-11 DIAGNOSIS — C773 Secondary and unspecified malignant neoplasm of axilla and upper limb lymph nodes: Secondary | ICD-10-CM

## 2024-02-11 DIAGNOSIS — Z923 Personal history of irradiation: Secondary | ICD-10-CM | POA: Insufficient documentation

## 2024-02-11 DIAGNOSIS — M8588 Other specified disorders of bone density and structure, other site: Secondary | ICD-10-CM

## 2024-02-11 DIAGNOSIS — Z79899 Other long term (current) drug therapy: Secondary | ICD-10-CM | POA: Insufficient documentation

## 2024-02-11 DIAGNOSIS — Z860100 Personal history of colon polyps, unspecified: Secondary | ICD-10-CM | POA: Diagnosis not present

## 2024-02-11 DIAGNOSIS — Z79811 Long term (current) use of aromatase inhibitors: Secondary | ICD-10-CM | POA: Insufficient documentation

## 2024-02-11 DIAGNOSIS — Z79621 Long term (current) use of calcineurin inhibitor: Secondary | ICD-10-CM | POA: Insufficient documentation

## 2024-02-11 DIAGNOSIS — C50811 Malignant neoplasm of overlapping sites of right female breast: Secondary | ICD-10-CM | POA: Diagnosis not present

## 2024-02-11 DIAGNOSIS — Z170 Estrogen receptor positive status [ER+]: Secondary | ICD-10-CM | POA: Insufficient documentation

## 2024-02-11 DIAGNOSIS — Z9011 Acquired absence of right breast and nipple: Secondary | ICD-10-CM | POA: Insufficient documentation

## 2024-02-11 DIAGNOSIS — K529 Noninfective gastroenteritis and colitis, unspecified: Secondary | ICD-10-CM | POA: Diagnosis not present

## 2024-02-11 DIAGNOSIS — Z1722 Progesterone receptor negative status: Secondary | ICD-10-CM | POA: Insufficient documentation

## 2024-02-11 LAB — CMP (CANCER CENTER ONLY)
ALT: 19 U/L (ref 0–44)
AST: 32 U/L (ref 15–41)
Albumin: 3.5 g/dL (ref 3.5–5.0)
Alkaline Phosphatase: 128 U/L — ABNORMAL HIGH (ref 38–126)
Anion gap: 3 — ABNORMAL LOW (ref 5–15)
BUN: 19 mg/dL (ref 8–23)
CO2: 31 mmol/L (ref 22–32)
Calcium: 8.8 mg/dL — ABNORMAL LOW (ref 8.9–10.3)
Chloride: 104 mmol/L (ref 98–111)
Creatinine: 0.7 mg/dL (ref 0.44–1.00)
GFR, Estimated: >60 mL/min (ref >60)
Glucose, Bld: 97 mg/dL (ref 70–99)
Potassium: 5.3 mmol/L — ABNORMAL HIGH (ref 3.5–5.1)
Sodium: 138 mmol/L (ref 135–145)
Total Bilirubin: 0.4 mg/dL (ref 0.0–1.2)
Total Protein: 6.4 g/dL — ABNORMAL LOW (ref 6.5–8.1)

## 2024-02-11 LAB — CBC WITH DIFFERENTIAL (CANCER CENTER ONLY)
Abs Immature Granulocytes: 0.02 10*3/uL (ref 0.00–0.07)
Basophils Absolute: 0.1 10*3/uL (ref 0.0–0.1)
Basophils Relative: 1 %
Eosinophils Absolute: 0.2 10*3/uL (ref 0.0–0.5)
Eosinophils Relative: 4 %
HCT: 40.5 % (ref 36.0–46.0)
Hemoglobin: 13.6 g/dL (ref 12.0–15.0)
Immature Granulocytes: 0 %
Lymphocytes Relative: 17 %
Lymphs Abs: 1 10*3/uL (ref 0.7–4.0)
MCH: 32 pg (ref 26.0–34.0)
MCHC: 33.6 g/dL (ref 30.0–36.0)
MCV: 95.3 fL (ref 80.0–100.0)
Monocytes Absolute: 0.6 10*3/uL (ref 0.1–1.0)
Monocytes Relative: 10 %
Neutro Abs: 3.7 10*3/uL (ref 1.7–7.7)
Neutrophils Relative %: 68 %
Platelet Count: 113 10*3/uL — ABNORMAL LOW (ref 150–400)
RBC: 4.25 MIL/uL (ref 3.87–5.11)
RDW: 17.5 % — ABNORMAL HIGH (ref 11.5–15.5)
WBC Count: 5.5 10*3/uL (ref 4.0–10.5)
nRBC: 0 % (ref 0.0–0.2)

## 2024-02-11 MED ORDER — NERATINIB MALEATE 40 MG PO TABS
240.0000 mg | ORAL_TABLET | Freq: Every day | ORAL | 4 refills | Status: DC
  Filled 2024-02-11 – 2024-02-24 (×2): qty 180, 30d supply, fill #0
  Filled 2024-05-21 – 2024-06-02 (×2): qty 180, 30d supply, fill #1
  Filled 2024-06-23: qty 180, 30d supply, fill #2
  Filled 2024-07-22: qty 180, 30d supply, fill #3
  Filled 2024-08-21: qty 180, 30d supply, fill #4

## 2024-02-11 NOTE — Progress Notes (Signed)
 [facility]   [phone] [phone]   Clinic Follow up Note   Patient Care Team: Ashley Dimitri, MD as PCP - General (Family Medicine) Ashley Lex, MD as PCP - Cardiology (Cardiology) Ashley Proud, RN as Oncology Nurse Navigator Ashley Angelica, RN as Oncology Nurse Navigator Ashley Loron, MD as Consulting Physician (General Surgery) Ashley Mood, MD as Consulting Physician (Hematology) Ashley Puffer, MD as Consulting Physician (Radiation Oncology)  Date of Service:  02/11/2024  CHIEF COMPLAINT: f/u of breast cancer  CURRENT THERAPY: Anastrozole and neratinib  Oncology History   Malignant neoplasm of overlapping sites of right breast in female, estrogen receptor positive (HCC) invasive lobular carcinoma, Stage IIIA, c(T3, N1), yp(T3, N1a), ER+/PR-/HER2+, Grade 2  -Diagnosed in 01/2022 -s/p neoadjuvant chemo TCHP X6, followed by right mastectomy by Ashley Robertson and reconstruction by Ashley Robertson. Unfortunately she did not have much response to neoadjvuant chemo  -on adjuvant Kadcyla now, tolerating well with no noticeable side effects. Plan for a total of 14 treatment -she received postmastectomy radiation under Ashley Robertson, 10/10 - 10/29/22. -she started adjuvant anastrozole in January 2024, she is tolerating very well -Echocardiogram in January 2024 showed slightly decreased EF 50 to 55%.  She was seen by cardiologist after echo, her cardiac medication was adjusted, and we will continue HER2 antibody.  repeated echo on 03/18/23 showed normal EF 55%. She finished Kadcyla in 06/2023  Osteopenia -Her most recent DEXA was on 04/29/20 showing osteopenia (T-score of -2.1 at L1-3). -continue calcium and vitD  -We discussed the negative impact on bone density from aromatase inhibitor -I previously recommended her to consider Zometa infusion every 6 months for 2 years, benefit and side effect discussed with her. She will think about it      Assessment and Plan    Breast cancer She underwent breast reconstruction surgery on January 17 without complications and reports no pain. She completed Keytruda treatment in July last year and is currently on anastrozole. She experiences diarrhea but avoids frequent Imodium use due to severe constipation. She is also on Nerlynx, which will be completed in August. Her recent mammogram in January and the Signatera test before port removal were negative. She is at risk for recurrence and prefers monitoring with Signatera every 3 to 6 months for early detection. - Continue anastrozole and Nerlynx as prescribed - Perform Signatera test at next visit in July - Monitor for recurrence with Signatera every 3 to 6 months - Follow up in June  Radiation-induced skin changes She has darkened skin and sensitivity in the right breast due to previous radiation therapy, with a burnt sensation upon touch. Improvement is possible but not expected quickly. - Monitor skin changes and sensitivity  Thrombocytopenia Her platelet count is slightly below normal, likely due to previous chemotherapy, but remains safe. She is on baby aspirin, which may contribute to easy bruising. - Monitor platelet count  Anemia Her anemia from last year has resolved, with normal blood counts since December. The cause was undetermined but may have been due to bleeding. - Discontinue iron tablets - Continue prenatal vitamins for a while  Follow-up She is scheduled for a gastroenterology appointment today to arrange a colonoscopy. She will see Ashley Robertson in April and follow up with hematology and oncology in July. Her platelet count should be adequate for colonoscopy and biopsy if needed. - Schedule colonoscopy with gastroenterologist - Follow up with Ashley Robertson in April - Follow up with hematology and oncology clinic in July  Plan -lab reviewed -She is clinically doing well, will continue breast cancer surveillance -Continue  anastrozole and neratinib -Survivorship in a months, lab and follow-up with me in 4 months, will repeat Signatera in 4 months    SUMMARY OF ONCOLOGIC HISTORY: Oncology History Overview Note   Cancer Staging  Malignant neoplasm of overlapping sites of right breast in female, estrogen receptor positive (HCC) Staging form: Breast, AJCC 8th Edition - Clinical stage from 02/09/2022: Stage IIA (cT2, cN1, cM0, G2, ER+, PR-, HER2+) - Signed by Ashley Mood, MD on 02/20/2022    Malignant neoplasm of overlapping sites of right breast in female, estrogen receptor positive (HCC)  02/08/2022 Mammogram   CLINICAL DATA:  Acute onset right breast lump.  EXAM: DIGITAL DIAGNOSTIC UNILATERAL RIGHT MAMMOGRAM WITH TOMOSYNTHESIS AND CAD; ULTRASOUND RIGHT BREAST LIMITED  IMPRESSION: 1. Highly suspicious findings mammographically and sonographically. I suspect there is significant malignancy throughout most of the right breast. Discrete masses are seen at 11 o'clock and 6 o'clock.  A single borderline lymph node is identified. This lymph node appears prominent compared to the remainder of the lymph nodes. The skin thickening suggests the possibility of inflammatory breast cancer.   02/09/2022 Cancer Staging   Staging form: Breast, AJCC 8th Edition - Clinical stage from 02/09/2022: Stage IIIA (cT3, cN1, cM0, G2, ER+, PR-, HER2+) - Signed by Ashley Mood, MD on 03/05/2022 Stage prefix: Initial diagnosis Histologic grading system: 3 grade system   02/09/2022 Initial Biopsy   Diagnosis 1. Breast, right, needle core biopsy, right breast 11 o'clock mass ribbon clip - INVASIVE MAMMARY CARCINOMA - SEE COMMENT 2. Breast, right, needle core biopsy, right breast 6'oclock mass, coil clip - INVASIVE MAMMARY CARCINOMA - SEE COMMENT 3. Lymph node, needle/core biopsy, right axillary lymph node, tribell clip - METASTATIC CARCINOMA INVOLVING NODAL TISSUE Microscopic Comment 1. and 2. The biopsy material shows an infiltrative  proliferation of cells arranged linearly and in small clusters. Based on the biopsy, the carcinoma appears Nottingham grade 2 of 3 and measures 1.5 cm in greatest linear extent.  Addendum parts 1 and 2: Immunohistochemistry for E-cadherin is negative consistent with lobular carcinoma.  2. PROGNOSTIC INDICATORS Results: The tumor cells are EQUIVOCAL for Her2 (2+). Her2 by FISH will be performed and the results reported separately. Estrogen Receptor: 100%, POSITIVE, STRONG STAINING INTENSITY Progesterone Receptor: <1%, NEGATIVE Proliferation Marker Ki67: 10%  2. FLUORESCENCE IN-SITU HYBRIDIZATION Results: GROUP 1: HER2 **POSITIVE**   3. PROGNOSTIC INDICATORS Results: By immunohistochemistry, the tumor cells are POSITIVE for Her2 (3+). Estrogen Receptor: 100%, POSITIVE, STRONG STAINING INTENSITY Progesterone Receptor: <1%, NEGATIVE   02/16/2022 Initial Diagnosis   Malignant neoplasm of overlapping sites of right breast in female, estrogen receptor positive (HCC)   03/01/2022 Imaging   EXAM: BILATERAL BREAST MRI WITH AND WITHOUT CONTRAST  IMPRESSION: 1. 8.5 x 8.3 x 5.9 cm area of confluent mass-like enhancement in the right breast involving all 4 quadrants and containing 2 biopsy marker clip artifacts, compatible with 4 quadrant biopsy-proven malignancy. 2. Biopsy-proven metastatic lymph node in the right axilla. 3. Possible metastatic intramammary lymph node in the posterior outer right breast just above the level of the nipple. 4. No evidence of malignancy on the left.   03/01/2022 Genetic Testing   Negative hereditary cancer genetic testing: no pathogenic variants detected in [employer] CustomNext-Cancer +RNAinsight Panel.  Variant of uncertain significance reported in BRIP1 at p.F934V (c.2800T>G). Report date is March 01, 2022.    The CustomNext-Cancer+RNAinsight panel offered by [employer] includes sequencing and rearrangement  analysis for the following 47 genes:  APC, ATM,  AXIN2, BARD1, BMPR1A, BRCA1, BRCA2, BRIP1, CDH1, CDK4, CDKN2A, CHEK2, DICER1, EPCAM, GREM1, HOXB13, MEN1, MLH1, MSH2, MSH3, MSH6, MUTYH, NBN, NF1, NF2, NTHL1, PALB2, PMS2, POLD1, POLE, PTEN, RAD51C, RAD51D, RECQL, RET, SDHA, SDHAF2, SDHB, SDHC, SDHD, SMAD4, SMARCA4, STK11, TP53, TSC1, TSC2, and VHL.  RNA data is routinely analyzed for use in variant interpretation for all genes.  UPDATE: BRIP1 p.F934V VUS has been amended to Likely Benign. Amended report date is 11/06/2022.    03/05/2022 Imaging   EXAM: CT CHEST, ABDOMEN, AND PELVIS WITH CONTRAST  IMPRESSION: 1. Asymmetric, masslike density of the glandular tissue of the right breast with overlying skin thickening, in keeping with known primary breast malignancy. 2. Enlarged, ill-defined right axillary lymph node containing a biopsy marking clip, consistent with known nodal metastatic disease. 3. No other evidence of lymphadenopathy or metastatic disease in the chest, abdomen, or pelvis. 4. Emphysema. Background of fine centrilobular nodularity, most concentrated in the lung apices, consistent with smoking-related respiratory bronchiolitis. 5. Aneurysm of the infrarenal abdominal aorta measuring up to 5.4 x 5.3 cm with a large burden of eccentric mural thrombus. Recommend follow-up CT/MR every 6 months and vascular consultation. This recommendation follows ACR consensus guidelines: White Paper of the ACR Incidental Findings Committee II on Vascular Findings. J Am Coll Radiol 2013; 10:789-794. 6. Coronary artery disease.   03/05/2022 Imaging   EXAM: NUCLEAR MEDICINE WHOLE BODY BONE SCAN  IMPRESSION: No evidence of bony metastatic disease.   03/07/2022 - 08/01/2022 Chemotherapy   Patient is on Treatment Plan : BREAST  Docetaxel + Carboplatin + Trastuzumab + Pertuzumab  (TCHP) q21d      07/26/2022 Surgery   Right mastectomy with axillary node seed guided excision converted to axillary node dissection with reconstruction by Drs. Dwain Robertson and  Thimmappa   07/26/2022 Pathology Results   FINAL MICROSCOPIC DIAGNOSIS:   A. LYMPH NODE, RIGHT AXILLARY TARGETED, EXCISION:  - Metastatic carcinoma involving one lymph node, 2 cm (1/1).  - Focal extranodal extension.  - Biopsy site and biopsy clip.   B. BREAST, RIGHT, MASTECTOMY:  - Invasive lobular carcinoma, 9.5 cm (ypT3).  - Carcinoma involves dermis of nipple.  - All surgical margins negative for carcinoma.  - One lymph node negative for metastatic carcinoma (0/1).  - Biopsy sites and biopsy clips.  - See oncology table.   C. LYMPH NODE, RIGHT AXILLARY, SENTINEL, EXCISION:  - One lymph node negative for metastatic carcinoma (0/1).   D. LYMPH NODE, RIGHT AXILLARY, SENTINEL, EXCISION:  - Metastatic carcinoma in one lymph node, 0.5 cm. (1/1).   E. LYMPH NODE, RIGHT AXILLARY, SENTINEL, EXCISION:  - One lymph node negative for metastatic carcinoma (0/1).    07/26/2022 Cancer Staging   Cancer Staging  Malignant neoplasm of overlapping sites of right breast in female, estrogen receptor positive (HCC) Staging form: Breast, AJCC 8th Edition - Clinical stage from 02/09/2022: Stage IIIA (cT3, cN1, cM0, G2, ER+, PR-, HER2+) - Signed by Ashley Mood, MD on 03/05/2022 Stage prefix: Initial diagnosis Histologic grading system: 3 grade system - Pathologic stage from 07/26/2022: Stage IIIA (pT3, pN1a, cM0, G2, ER+, PR-, HER2+) - Signed by Pollyann Samples, NP on 08/01/2022 Histologic grading system: 3 grade system    07/26/2022 Cancer Staging   Staging form: Breast, AJCC 8th Edition - Pathologic stage from 07/26/2022: Stage IIIA (pT3, pN1a, cM0, G2, ER+, PR-, HER2+) - Signed by Pollyann Samples, NP on 08/01/2022 Histologic grading system: 3 grade system  08/24/2022 -  Chemotherapy   Patient is on Treatment Plan : BREAST ADO-Trastuzumab Emtansine (Kadcyla) q21d     07/24/2023 Imaging   Echocardiogram  Normal LVF at 55-60 % Normal left diastolic function. Normal right ventricle  Normal visualized  valves with preserved function      Discussed the use of AI scribe software for clinical note transcription with the patient, who gave verbal consent to proceed.  History of Present Illness   Ashley Robertson, a 68 year old female with a history of breast cancer, presents for a follow-up visit. She reports having her last reconstructive surgery in January, with no residual pain or complications. She had an issue with anemia the previous year, which has since resolved. Her current medications include Lennox and anastrozole. She reports experiencing diarrhea, which she manages without frequently taking Imodium. She is also taking prenatal vitamins and iron tablets. She completed a course of Keytruda in July of the previous year. She is scheduled to meet with a gastroenterologist to arrange a colonoscopy.         All other systems were reviewed with the patient and are negative.  MEDICAL HISTORY:  Past Medical History:  Diagnosis Date   Allergy    Lisinopril and lipitor   Anxiety and depression    Aortic atherosclerosis (HCC) 03/2020   CT Chest: 2 V (LAD & LCx) Coronary Atherosclerosis, Aortic Atherosclerosis (no aneurysm).  Mild centrilobular emphysema with mild diffuse bronchial thickening; several scattered small solitary pulmonary nodules (largest 5.6 mm in anterior left upper lobe)   Breast cancer (HCC) 12/2021   Right breast ILC   Carotid artery plaque, bilateral 10/2015   Mild to moderate plaque L>R without significant stenosis   COPD (chronic obstructive pulmonary disease) (HCC)    Coronary Artery Calcification - Score 79    Coronary Calcium Score 79.  LAD and circumflex calcification noted.  Normal ascending aorta with mild calcification.   Current every day smoker    pt quit smoking April 2023   Dysrhythmia 01/2023   developed post op a-fib after AAA repair, converted with amiodarone   Emphysema lung (HCC)    Noted on chest CT   Family history of breast cancer 02/21/2022   Family  history of prostate cancer 02/21/2022   Hyperlipidemia    Hypertension    Controlled with amlodipine   Prediabetes    Skin cancer 2021   Remove 2022    SURGICAL HISTORY: Past Surgical History:  Procedure Laterality Date   ABDOMINAL AORTIC ANEURYSM REPAIR N/A 01/11/2023   Procedure: ANEURYSM ABDOMINAL AORTIC REPAIR;  Surgeon: Nada Libman, MD;  Location: [facility] OR;  Service: Vascular;  Laterality: N/A;   BREAST BIOPSY Right    times 3   BREAST RECONSTRUCTION WITH PLACEMENT OF TISSUE EXPANDER AND ALLODERM Right 07/26/2022   Procedure: RIGHT BREAST RECONSTRUCTION WITH PLACEMENT OF TISSUE EXPANDER AND ALLODERM;  Surgeon: Glenna Fellows, MD;  Location: [facility] OR;  Service: Plastics;  Laterality: Right;   CESAREAN SECTION  1990, 1991   COSMETIC SURGERY  1981   Rhinoplasty   MASTECTOMY Right    MASTECTOMY W/ SENTINEL NODE BIOPSY Right 07/26/2022   Procedure: RIGHT MASTECTOMY, RIGHT AXILLARY SENTINEL NODE BIOPSY;  Surgeon: Ashley Loron, MD;  Location: [facility] OR;  Service: General;  Laterality: Right;  GEN & PEC BLOCK   MASTOPEXY Left 12/20/2023   Procedure: MASTOPEXY;  Surgeon: Glenna Fellows, MD;  Location: [facility];  Service: Plastics;  Laterality: Left;   PORT-A-CATH REMOVAL Left 12/20/2023   Procedure:  REMOVAL PORT-A-CATH;  Surgeon: Glenna Fellows, MD;  Location: [facility];  Service: Plastics;  Laterality: Left;   PORTACATH PLACEMENT Left 03/06/2022   Procedure: INSERTION PORT-A-CATH;  Surgeon: Ashley Loron, MD;  Location: [facility];  Service: General;  Laterality: Left;   RADIOACTIVE SEED GUIDED AXILLARY SENTINEL LYMPH NODE Right 07/26/2022   Procedure: RADIOACTIVE SEED GUIDED AXILLARY SENTINEL LYMPH NODE DISSECTION;  Surgeon: Ashley Loron, MD;  Location: [facility];  Service: General;  Laterality: Right;   REMOVAL OF TISSUE EXPANDER AND PLACEMENT OF IMPLANT Right 12/20/2023   Procedure: REMOVAL OF TISSUE EXPANDER AND PLACEMENT  OF SILICONE IMPLANT;  Surgeon: Glenna Fellows, MD;  Location: [facility];  Service: Plastics;  Laterality: Right;   RHINOPLASTY  1981   TONSILLECTOMY  1978   TRANSTHORACIC ECHOCARDIOGRAM  10/2016   EF 55 to 60%.  Normal systolic and diastolic function.  No ASD/PFO   TUBAL LIGATION  1991    I have reviewed the social history and family history with the patient and they are unchanged from previous note.  ALLERGIES:  is allergic to lisinopril and lipitor [atorvastatin].  MEDICATIONS:  Current Outpatient Medications  Medication Sig Dispense Refill   albuterol (PROVENTIL HFA) 108 (90 Base) MCG/ACT inhaler Inhale 1 to 2 puffs into the lungs every 6 hours as needed 8.5 g 2   alendronate (FOSAMAX) 70 MG tablet Take 1 tablet by mouth once weekly --Take 30 minutes before the first food, beverage or medicine of the day with plain water 12 tablet 3   amLODipine (NORVASC) 10 MG tablet Take 1 tablet (10 mg total) by mouth at bedtime. 90 tablet 1   anastrozole (ARIMIDEX) 1 MG tablet Take 1 tablet (1 mg) by mouth daily. 90 tablet 3   aspirin 81 MG tablet Take 81 mg by mouth at bedtime.     Calcium Carb-Cholecalciferol (CALCIUM 500 + D3 PO) Take 1 tablet by mouth daily.     carvedilol (COREG) 6.25 MG tablet Take 1 tablet (6.25 mg total) by mouth 2 (two) times daily. 180 tablet 3   Cholecalciferol (VITAMIN D) 50 MCG (2000 UT) CAPS Take 1 capsule by mouth daily. 30 capsule 2   ezetimibe (ZETIA) 10 MG tablet Take 1 tablet (10 mg total) by mouth daily. 90 tablet 2   ferrous sulfate 325 (65 FE) MG EC tablet Take 325 mg by mouth 3 (three) times daily with meals.     FLUoxetine (PROZAC) 20 MG capsule Take 1 capsule (20 mg total) by mouth daily. 90 capsule 3   Fluticasone-Umeclidin-Vilant (TRELEGY ELLIPTA) 200-62.5-25 MCG/ACT AEPB Inhale 1 puff into the lungs daily. 60 each 2   methocarbamol (ROBAXIN) 500 MG tablet Take 1 tablet (500 mg total) by mouth 3 (three) times daily as needed for muscle  spasms. 5 tablet 0   minocycline (MINOCIN) 100 MG capsule Take 1 capsule (100 mg total) by mouth 2 (two) times daily. 60 capsule 0   [name] Maleate (NERLYNX) 40 MG tablet Take 6 tablets (240 mg total) by mouth daily. Take with food. 180 tablet 4   prenatal vitamin w/FE, FA (PRENATAL 1 + 1) 27-1 MG TABS tablet Take 1 tablet by mouth daily at 12 noon.     rosuvastatin (CRESTOR) 5 MG tablet Take 1 tablet by mouth daily at bedtime ,except on Monday, Wednesday and Fridays take 2 tablets at bedtime as directed 180 tablet 0   Specialty Vitamins Products (MAGNESIUM, AMINO ACID CHELATE,) 133 MG tablet Take 1 tablet by  mouth at bedtime.     tacrolimus (PROTOPIC) 0.1 % ointment Apply to affected area 1-2 times daily x2-4 weeks then as needed for flares 60 g 2   No current facility-administered medications for this visit.    PHYSICAL EXAMINATION: ECOG PERFORMANCE STATUS: 0 - Asymptomatic  Vitals:   02/11/24 0943  BP: (!) 140/70  Pulse: 66  Resp: (!) 21  Temp: 97.6 F (36.4 C)  SpO2: 98%   Wt Readings from Last 3 Encounters:  02/11/24 145 lb 4.8 oz (65.9 kg)  02/05/24 144 lb 12.8 oz (65.7 kg)  12/20/23 142 lb (64.4 kg)     GENERAL:alert, no distress and comfortable SKIN: skin color, texture, turgor are normal, no rashes or significant lesions except for multiple ecchymosis on her forearms. EYES: normal, Conjunctiva are pink and non-injected, sclera clear NECK: supple, thyroid normal size, non-tender, without nodularity LYMPH:  no palpable lymphadenopathy in the cervical, axillary  LUNGS: clear to auscultation and percussion with normal breathing effort HEART: regular rate & rhythm and no murmurs and no lower extremity edema ABDOMEN:abdomen soft, non-tender and normal bowel sounds Musculoskeletal:no cyanosis of digits and no clubbing  NEURO: alert & oriented x 3 with fluent speech, no focal motor/sensory deficits Breast exam showed right mastectomy with implant, incision has healed well,  left breast with recent reduction, incision healed, no palpable mass or adenopathy.   LABORATORY DATA:  I have reviewed the data as listed    Latest Ref Rng & Units 02/11/2024    9:31 AM 12/18/2023    9:45 AM 11/12/2023    9:35 AM  CBC  WBC 4.0 - 10.5 K/uL 5.5  4.0    Hemoglobin 12.0 - 15.0 g/dL 08.6  57.8    Hematocrit 36.0 - 46.0 % 40.5  38.1  29.3   Platelets 150 - 400 K/uL 113  105          Latest Ref Rng & Units 02/11/2024    9:31 AM 12/18/2023    9:45 AM 11/12/2023    8:24 AM  CMP  Glucose 70 - 99 mg/dL 97  469  629   BUN 8 - 23 mg/dL 19  14  14    Creatinine 0.44 - 1.00 mg/dL 5.28  4.13  2.44   Sodium 135 - 145 mmol/L 138  135  139   Potassium 3.5 - 5.1 mmol/L 5.3  4.3  4.4   Chloride 98 - 111 mmol/L 104  104  107   CO2 22 - 32 mmol/L 31  27  27    Calcium 8.9 - 10.3 mg/dL 8.8  9.1  8.7   Total Protein 6.5 - 8.1 g/dL 6.4  7.2  6.7   Total Bilirubin 0.0 - 1.2 mg/dL 0.4  0.6  0.4   Alkaline Phos 38 - 126 U/L 128  168  169   AST 15 - 41 U/L 32  43  35   ALT 0 - 44 U/L 19  24  20        RADIOGRAPHIC STUDIES: I have personally reviewed the radiological images as listed and agreed with the findings in the report. No results found.    No orders of the defined types were placed in this encounter.  All questions were answered. The patient knows to call the clinic with any problems, questions or concerns. No barriers to learning was detected. The total time spent in the appointment was 25 minutes.     Ashley Mood, MD 02/11/2024

## 2024-02-12 ENCOUNTER — Other Ambulatory Visit: Payer: Self-pay

## 2024-02-18 DIAGNOSIS — L2089 Other atopic dermatitis: Secondary | ICD-10-CM | POA: Diagnosis not present

## 2024-02-18 DIAGNOSIS — L718 Other rosacea: Secondary | ICD-10-CM | POA: Diagnosis not present

## 2024-02-19 ENCOUNTER — Other Ambulatory Visit: Payer: Self-pay

## 2024-02-20 ENCOUNTER — Other Ambulatory Visit: Payer: Self-pay

## 2024-02-23 ENCOUNTER — Other Ambulatory Visit: Payer: Self-pay | Admitting: Physician Assistant

## 2024-02-23 ENCOUNTER — Other Ambulatory Visit (HOSPITAL_COMMUNITY): Payer: Self-pay

## 2024-02-24 ENCOUNTER — Other Ambulatory Visit: Payer: Self-pay

## 2024-02-24 ENCOUNTER — Other Ambulatory Visit (HOSPITAL_COMMUNITY): Payer: Self-pay

## 2024-02-24 MED ORDER — ALENDRONATE SODIUM 70 MG PO TABS
70.0000 mg | ORAL_TABLET | ORAL | 0 refills | Status: DC
  Filled 2024-02-24: qty 12, 84d supply, fill #0

## 2024-02-24 MED ORDER — AMLODIPINE BESYLATE 10 MG PO TABS
10.0000 mg | ORAL_TABLET | Freq: Every day | ORAL | 3 refills | Status: AC
  Filled 2024-02-24: qty 90, 90d supply, fill #0
  Filled 2024-05-20: qty 90, 90d supply, fill #1
  Filled 2024-08-21: qty 90, 90d supply, fill #2
  Filled 2024-11-21: qty 90, 90d supply, fill #3

## 2024-02-24 NOTE — Progress Notes (Signed)
 Specialty Pharmacy Refill Coordination Note  Ashley Robertson is a 68 y.o. female contacted today regarding refills of specialty medication(s) [name] Maleate [facility])   Patient requested [name] at [facility] date: 03/06/24   Medication will be filled on 04.03.25.

## 2024-02-26 ENCOUNTER — Other Ambulatory Visit (HOSPITAL_COMMUNITY): Payer: Self-pay

## 2024-02-28 DIAGNOSIS — C50911 Malignant neoplasm of unspecified site of right female breast: Secondary | ICD-10-CM | POA: Diagnosis not present

## 2024-02-28 DIAGNOSIS — Z9012 Acquired absence of left breast and nipple: Secondary | ICD-10-CM | POA: Diagnosis not present

## 2024-03-04 ENCOUNTER — Ambulatory Visit (HOSPITAL_COMMUNITY)
Admission: RE | Admit: 2024-03-04 | Discharge: 2024-03-04 | Disposition: A | Discharge status: Home / Self Care | Source: Ambulatory Visit | Attending: Internal Medicine | Admitting: Internal Medicine

## 2024-03-04 DIAGNOSIS — I6529 Occlusion and stenosis of unspecified carotid artery: Secondary | ICD-10-CM | POA: Diagnosis not present

## 2024-03-04 DIAGNOSIS — I2510 Atherosclerotic heart disease of native coronary artery without angina pectoris: Secondary | ICD-10-CM | POA: Insufficient documentation

## 2024-03-04 DIAGNOSIS — I10 Essential (primary) hypertension: Secondary | ICD-10-CM | POA: Diagnosis not present

## 2024-03-04 DIAGNOSIS — E7800 Pure hypercholesterolemia, unspecified: Secondary | ICD-10-CM | POA: Insufficient documentation

## 2024-03-04 DIAGNOSIS — I2584 Coronary atherosclerosis due to calcified coronary lesion: Secondary | ICD-10-CM | POA: Diagnosis not present

## 2024-03-04 DIAGNOSIS — I700 Atherosclerosis of aorta: Secondary | ICD-10-CM | POA: Diagnosis not present

## 2024-03-04 DIAGNOSIS — I350 Nonrheumatic aortic (valve) stenosis: Secondary | ICD-10-CM | POA: Diagnosis not present

## 2024-03-04 LAB — ECHOCARDIOGRAM LIMITED
AR max vel: 1.46 cm2
AV Area VTI: 1.55 cm2
AV Area mean vel: 1.42 cm2
AV Mean grad: 10.5 mmHg
AV Peak grad: 20.4 mmHg
Ao pk vel: 2.26 m/s
Calc EF: 49.2 %
Single Plane A2C EF: 60.8 %
Single Plane A4C EF: 41 %

## 2024-03-05 ENCOUNTER — Other Ambulatory Visit: Payer: Self-pay

## 2024-03-05 ENCOUNTER — Other Ambulatory Visit (HOSPITAL_COMMUNITY): Payer: Self-pay

## 2024-03-05 ENCOUNTER — Telehealth: Payer: Self-pay

## 2024-03-05 DIAGNOSIS — I350 Nonrheumatic aortic (valve) stenosis: Secondary | ICD-10-CM

## 2024-03-05 DIAGNOSIS — I700 Atherosclerosis of aorta: Secondary | ICD-10-CM

## 2024-03-05 NOTE — Telephone Encounter (Signed)
 Opened in error

## 2024-03-07 ENCOUNTER — Other Ambulatory Visit (HOSPITAL_COMMUNITY): Payer: Self-pay

## 2024-03-07 ENCOUNTER — Inpatient Hospital Stay (HOSPITAL_BASED_OUTPATIENT_CLINIC_OR_DEPARTMENT_OTHER): Admitting: Anesthesiology

## 2024-03-07 ENCOUNTER — Observation Stay (HOSPITAL_COMMUNITY)
Admit: 2024-03-07 | Discharge: 2024-03-08 | Disposition: A | Discharge status: Home / Self Care | Source: Ambulatory Visit | Attending: Family Medicine | Admitting: Family Medicine

## 2024-03-07 ENCOUNTER — Encounter (HOSPITAL_COMMUNITY): Payer: Self-pay | Admitting: Plastic Surgery

## 2024-03-07 ENCOUNTER — Inpatient Hospital Stay (HOSPITAL_COMMUNITY): Admitting: Anesthesiology

## 2024-03-07 ENCOUNTER — Encounter (HOSPITAL_COMMUNITY)
Disposition: A | Discharge status: Home / Self Care | Payer: Self-pay | Source: Ambulatory Visit | Attending: Internal Medicine

## 2024-03-07 ENCOUNTER — Other Ambulatory Visit: Payer: Self-pay

## 2024-03-07 DIAGNOSIS — I10 Essential (primary) hypertension: Secondary | ICD-10-CM

## 2024-03-07 DIAGNOSIS — L03313 Cellulitis of chest wall: Principal | ICD-10-CM | POA: Insufficient documentation

## 2024-03-07 DIAGNOSIS — Z8679 Personal history of other diseases of the circulatory system: Secondary | ICD-10-CM

## 2024-03-07 DIAGNOSIS — J449 Chronic obstructive pulmonary disease, unspecified: Secondary | ICD-10-CM | POA: Diagnosis not present

## 2024-03-07 DIAGNOSIS — I480 Paroxysmal atrial fibrillation: Secondary | ICD-10-CM | POA: Insufficient documentation

## 2024-03-07 DIAGNOSIS — Z853 Personal history of malignant neoplasm of breast: Secondary | ICD-10-CM | POA: Insufficient documentation

## 2024-03-07 DIAGNOSIS — S21101A Unspecified open wound of right front wall of thorax without penetration into thoracic cavity, initial encounter: Secondary | ICD-10-CM | POA: Diagnosis not present

## 2024-03-07 DIAGNOSIS — R7303 Prediabetes: Secondary | ICD-10-CM | POA: Diagnosis present

## 2024-03-07 DIAGNOSIS — Z9011 Acquired absence of right breast and nipple: Secondary | ICD-10-CM | POA: Insufficient documentation

## 2024-03-07 DIAGNOSIS — E876 Hypokalemia: Secondary | ICD-10-CM | POA: Diagnosis not present

## 2024-03-07 DIAGNOSIS — J439 Emphysema, unspecified: Secondary | ICD-10-CM | POA: Insufficient documentation

## 2024-03-07 DIAGNOSIS — E785 Hyperlipidemia, unspecified: Secondary | ICD-10-CM | POA: Diagnosis not present

## 2024-03-07 DIAGNOSIS — N61 Mastitis without abscess: Secondary | ICD-10-CM

## 2024-03-07 DIAGNOSIS — I2510 Atherosclerotic heart disease of native coronary artery without angina pectoris: Secondary | ICD-10-CM

## 2024-03-07 DIAGNOSIS — X58XXXA Exposure to other specified factors, initial encounter: Secondary | ICD-10-CM | POA: Diagnosis not present

## 2024-03-07 DIAGNOSIS — C50811 Malignant neoplasm of overlapping sites of right female breast: Secondary | ICD-10-CM

## 2024-03-07 DIAGNOSIS — F418 Other specified anxiety disorders: Secondary | ICD-10-CM | POA: Diagnosis not present

## 2024-03-07 DIAGNOSIS — Z170 Estrogen receptor positive status [ER+]: Secondary | ICD-10-CM

## 2024-03-07 DIAGNOSIS — Z87891 Personal history of nicotine dependence: Secondary | ICD-10-CM | POA: Diagnosis not present

## 2024-03-07 DIAGNOSIS — I4891 Unspecified atrial fibrillation: Principal | ICD-10-CM | POA: Diagnosis present

## 2024-03-07 HISTORY — PX: REMOVAL OF TISSUE EXPANDER AND PLACEMENT OF IMPLANT: SHX6457

## 2024-03-07 LAB — CBC
HCT: 38.8 % (ref 36.0–46.0)
Hemoglobin: 13.1 g/dL (ref 12.0–15.0)
MCH: 33.2 pg (ref 26.0–34.0)
MCHC: 33.8 g/dL (ref 30.0–36.0)
MCV: 98.5 fL (ref 80.0–100.0)
Platelets: 154 10*3/uL (ref 150–400)
RBC: 3.94 MIL/uL (ref 3.87–5.11)
RDW: 13.8 % (ref 11.5–15.5)
WBC: 8.9 10*3/uL (ref 4.0–10.5)
nRBC: 0 % (ref 0.0–0.2)

## 2024-03-07 LAB — COMPREHENSIVE METABOLIC PANEL WITH GFR
ALT: 18 U/L (ref 0–44)
AST: 19 U/L (ref 15–41)
Albumin: 2.8 g/dL — ABNORMAL LOW (ref 3.5–5.0)
Alkaline Phosphatase: 107 U/L (ref 38–126)
Anion gap: 12 (ref 5–15)
BUN: 12 mg/dL (ref 8–23)
CO2: 26 mmol/L (ref 22–32)
Calcium: 8.8 mg/dL — ABNORMAL LOW (ref 8.9–10.3)
Chloride: 96 mmol/L — ABNORMAL LOW (ref 98–111)
Creatinine, Ser: 0.77 mg/dL (ref 0.44–1.00)
GFR, Estimated: >60 mL/min (ref >60)
Glucose, Bld: 145 mg/dL — ABNORMAL HIGH (ref 70–99)
Potassium: 3.4 mmol/L — ABNORMAL LOW (ref 3.5–5.1)
Sodium: 134 mmol/L — ABNORMAL LOW (ref 135–145)
Total Bilirubin: 0.7 mg/dL (ref 0.0–1.2)
Total Protein: 6.8 g/dL (ref 6.5–8.1)

## 2024-03-07 LAB — BASIC METABOLIC PANEL WITH GFR
Anion gap: 15 (ref 5–15)
Anion gap: 9 (ref 5–15)
BUN: 14 mg/dL (ref 8–23)
BUN: 16 mg/dL (ref 8–23)
CO2: 22 mmol/L (ref 22–32)
CO2: 26 mmol/L (ref 22–32)
Calcium: 9 mg/dL (ref 8.9–10.3)
Calcium: 9 mg/dL (ref 8.9–10.3)
Chloride: 95 mmol/L — ABNORMAL LOW (ref 98–111)
Chloride: 100 mmol/L (ref 98–111)
Creatinine, Ser: 0.73 mg/dL (ref 0.44–1.00)
Creatinine, Ser: 0.62 mg/dL (ref 0.44–1.00)
GFR, Estimated: >60 mL/min (ref >60)
GFR, Estimated: >60 mL/min
Glucose, Bld: 122 mg/dL — ABNORMAL HIGH (ref 70–99)
Glucose, Bld: 118 mg/dL — ABNORMAL HIGH (ref 70–99)
Potassium: 2.6 mmol/L — CL (ref 3.5–5.1)
Potassium: 2.7 mmol/L — CL (ref 3.5–5.1)
Sodium: 132 mmol/L — ABNORMAL LOW (ref 135–145)
Sodium: 135 mmol/L (ref 135–145)

## 2024-03-07 LAB — TSH: TSH: 0.574 u[IU]/mL (ref 0.350–4.500)

## 2024-03-07 LAB — HEMOGLOBIN A1C
Hgb A1c MFr Bld: 5.2 % (ref 4.8–5.6)
Mean Plasma Glucose: 102.54 mg/dL

## 2024-03-07 LAB — MAGNESIUM: Magnesium: 2.1 mg/dL (ref 1.7–2.4)

## 2024-03-07 LAB — T4, FREE: Free T4: 1.34 ng/dL — ABNORMAL HIGH (ref 0.61–1.12)

## 2024-03-07 SURGERY — REMOVAL, TISSUE EXPANDER, BREAST, WITH IMPLANT INSERTION
Anesthesia: General | Site: Chest | Laterality: Right

## 2024-03-07 MED ORDER — HEPARIN SODIUM (PORCINE) 5000 UNIT/ML IJ SOLN
5000.0000 [IU] | Freq: Three times a day (TID) | INTRAMUSCULAR | Status: DC
  Administered 2024-03-07 – 2024-03-08 (×2): 5000 [IU] via SUBCUTANEOUS
  Filled 2024-03-07 (×3): qty 1

## 2024-03-07 MED ORDER — ROSUVASTATIN CALCIUM 5 MG PO TABS
5.0000 mg | ORAL_TABLET | Freq: Every day | ORAL | Status: DC
  Administered 2024-03-07 – 2024-03-08 (×2): 5 mg via ORAL
  Filled 2024-03-07 (×2): qty 1

## 2024-03-07 MED ORDER — LACTATED RINGERS IV SOLN: INTRAVENOUS | Status: DC

## 2024-03-07 MED ORDER — CEFAZOLIN SODIUM-DEXTROSE 1-4 GM/50ML-% IV SOLN
1.0000 g | Freq: Three times a day (TID) | INTRAVENOUS | Status: AC
  Administered 2024-03-07 – 2024-03-08 (×2): 1 g via INTRAVENOUS
  Filled 2024-03-07 (×3): qty 50

## 2024-03-07 MED ORDER — OXYCODONE HCL 5 MG PO TABS: 5.0000 mg | ORAL_TABLET | ORAL | Status: DC | PRN

## 2024-03-07 MED ORDER — ORAL CARE MOUTH RINSE: 15.0000 mL | Freq: Once | OROMUCOSAL | Status: AC

## 2024-03-07 MED ORDER — SODIUM CHLORIDE 0.9 % IV SOLN
Freq: Once | INTRAVENOUS | Status: DC
  Filled 2024-03-07 (×2): qty 10

## 2024-03-07 MED ORDER — MIDAZOLAM HCL 2 MG/2ML IJ SOLN
INTRAMUSCULAR | Status: AC
  Filled 2024-03-07: qty 2

## 2024-03-07 MED ORDER — METHOCARBAMOL 500 MG PO TABS: 500.0000 mg | ORAL_TABLET | Freq: Four times a day (QID) | ORAL | Status: DC | PRN

## 2024-03-07 MED ORDER — ACETAMINOPHEN 325 MG PO TABS: 650.0000 mg | ORAL_TABLET | Freq: Four times a day (QID) | ORAL | Status: DC | PRN

## 2024-03-07 MED ORDER — 0.9 % SODIUM CHLORIDE (POUR BTL) OPTIME
TOPICAL | Status: DC | PRN
  Administered 2024-03-07: 1000 mL

## 2024-03-07 MED ORDER — DEXAMETHASONE SODIUM PHOSPHATE 10 MG/ML IJ SOLN
INTRAMUSCULAR | Status: DC | PRN
  Administered 2024-03-07: 10 mg via INTRAVENOUS

## 2024-03-07 MED ORDER — CEFAZOLIN SODIUM-DEXTROSE 2-4 GM/100ML-% IV SOLN
2.0000 g | INTRAVENOUS | Status: AC
  Administered 2024-03-07: 2 g via INTRAVENOUS

## 2024-03-07 MED ORDER — MIDAZOLAM HCL 2 MG/2ML IJ SOLN
INTRAMUSCULAR | Status: DC | PRN
Start: 2024-03-07 — End: 2024-03-07
  Administered 2024-03-07: 2 mg via INTRAVENOUS

## 2024-03-07 MED ORDER — CHLORHEXIDINE GLUCONATE 0.12 % MT SOLN
OROMUCOSAL | Status: AC
  Administered 2024-03-07: 15 mL via OROMUCOSAL
  Filled 2024-03-07: qty 15

## 2024-03-07 MED ORDER — SODIUM CHLORIDE 0.9 % IV SOLN
INTRAVENOUS | Status: DC | PRN
  Administered 2024-03-07: 500 mL

## 2024-03-07 MED ORDER — BUPIVACAINE HCL (PF) 0.25 % IJ SOLN
INTRAMUSCULAR | Status: DC | PRN
  Administered 2024-03-07: 20 mL

## 2024-03-07 MED ORDER — CEFAZOLIN SODIUM-DEXTROSE 2-4 GM/100ML-% IV SOLN
INTRAVENOUS | Status: AC
  Filled 2024-03-07: qty 100

## 2024-03-07 MED ORDER — POVIDONE-IODINE 10 % EX SOLN
CUTANEOUS | Status: DC | PRN
  Administered 2024-03-07: 1 via TOPICAL

## 2024-03-07 MED ORDER — POTASSIUM CHLORIDE 10 MEQ/100ML IV SOLN
10.0000 meq | INTRAVENOUS | Status: AC
  Administered 2024-03-07 (×2): 10 meq via INTRAVENOUS
  Filled 2024-03-07 (×2): qty 100

## 2024-03-07 MED ORDER — PROPOFOL 10 MG/ML IV BOLUS
INTRAVENOUS | Status: DC | PRN
  Administered 2024-03-07: 160 mg via INTRAVENOUS

## 2024-03-07 MED ORDER — BUPIVACAINE HCL (PF) 0.25 % IJ SOLN
INTRAMUSCULAR | Status: AC
Start: 2024-03-07 — End: ?
  Filled 2024-03-07: qty 30

## 2024-03-07 MED ORDER — DROPERIDOL 2.5 MG/ML IJ SOLN: 0.6250 mg | Freq: Once | INTRAMUSCULAR | Status: DC | PRN

## 2024-03-07 MED ORDER — FENTANYL CITRATE (PF) 250 MCG/5ML IJ SOLN
INTRAMUSCULAR | Status: AC
  Filled 2024-03-07: qty 5

## 2024-03-07 MED ORDER — LIDOCAINE 2% (20 MG/ML) 5 ML SYRINGE
INTRAMUSCULAR | Status: DC | PRN
  Administered 2024-03-07: 60 mg via INTRAVENOUS

## 2024-03-07 MED ORDER — CARVEDILOL 6.25 MG PO TABS
6.2500 mg | ORAL_TABLET | Freq: Two times a day (BID) | ORAL | Status: DC
  Administered 2024-03-07 – 2024-03-08 (×2): 6.25 mg via ORAL
  Filled 2024-03-07 (×2): qty 1

## 2024-03-07 MED ORDER — SULFAMETHOXAZOLE-TRIMETHOPRIM 800-160 MG PO TABS
1.0000 | ORAL_TABLET | Freq: Two times a day (BID) | ORAL | 0 refills | Status: DC
  Filled 2024-03-07: qty 14, 7d supply, fill #0

## 2024-03-07 MED ORDER — POTASSIUM CHLORIDE 10 MEQ/100ML IV SOLN
10.0000 meq | INTRAVENOUS | Status: AC
Start: 2024-03-07 — End: 2024-03-07
  Administered 2024-03-07: 20 meq via INTRAVENOUS
  Filled 2024-03-07 (×2): qty 100

## 2024-03-07 MED ORDER — FENTANYL CITRATE (PF) 100 MCG/2ML IJ SOLN: 25.0000 ug | INTRAMUSCULAR | Status: DC | PRN

## 2024-03-07 MED ORDER — CHLORHEXIDINE GLUCONATE 0.12 % MT SOLN: 15.0000 mL | Freq: Once | OROMUCOSAL | Status: AC

## 2024-03-07 MED ORDER — FENTANYL CITRATE (PF) 250 MCG/5ML IJ SOLN
INTRAMUSCULAR | Status: DC | PRN
Start: 2024-03-07 — End: 2024-03-07
  Administered 2024-03-07: 50 ug via INTRAVENOUS
  Administered 2024-03-07: 100 ug via INTRAVENOUS

## 2024-03-07 MED ORDER — NERATINIB MALEATE 40 MG PO TABS: 240.0000 mg | ORAL_TABLET | Freq: Every day | ORAL | Status: DC

## 2024-03-07 MED ORDER — ONDANSETRON HCL 4 MG/2ML IJ SOLN
INTRAMUSCULAR | Status: DC | PRN
  Administered 2024-03-07: 4 mg via INTRAVENOUS

## 2024-03-07 MED ORDER — FLUOXETINE HCL 20 MG PO CAPS
20.0000 mg | ORAL_CAPSULE | Freq: Every day | ORAL | Status: DC
  Administered 2024-03-07 – 2024-03-08 (×2): 20 mg via ORAL
  Filled 2024-03-07 (×2): qty 1

## 2024-03-07 SURGICAL SUPPLY — 35 items
BAG COUNTER SPONGE SURGICOUNT (BAG) ×1 IMPLANT
BAG DECANTER FOR FLEXI CONT (MISCELLANEOUS) ×1 IMPLANT
BINDER BREAST LRG (GAUZE/BANDAGES/DRESSINGS) IMPLANT
CANISTER SUCT 3000ML PPV (MISCELLANEOUS) ×1 IMPLANT
COVER SURGICAL LIGHT HANDLE (MISCELLANEOUS) ×1 IMPLANT
DRAIN CHANNEL 19F RND (DRAIN) IMPLANT
DRAPE HALF SHEET 40X57 (DRAPES) ×2 IMPLANT
ELECT COATED BLADE 2.86 ST (ELECTRODE) IMPLANT
ELECT REM PT RETURN 9FT ADLT (ELECTROSURGICAL) ×1 IMPLANT
ELECTRODE REM PT RTRN 9FT ADLT (ELECTROSURGICAL) ×1 IMPLANT
EVACUATOR SILICONE 100CC (DRAIN) IMPLANT
GAUZE PAD ABD 8X10 STRL (GAUZE/BANDAGES/DRESSINGS) ×2 IMPLANT
GLOVE BIO SURGEON STRL SZ 6 (GLOVE) ×3 IMPLANT
GOWN STRL REUS W/ TWL LRG LVL3 (GOWN DISPOSABLE) ×2 IMPLANT
KIT BASIN OR (CUSTOM PROCEDURE TRAY) ×1 IMPLANT
KIT TURNOVER KIT B (KITS) ×1 IMPLANT
MARKER SKIN DUAL TIP RULER LAB (MISCELLANEOUS) ×1 IMPLANT
NDL HYPO 25GX1X1/2 BEV (NEEDLE) IMPLANT
NEEDLE HYPO 25GX1X1/2 BEV (NEEDLE) ×1 IMPLANT
NS IRRIG 1000ML POUR BTL (IV SOLUTION) ×2 IMPLANT
PACK GENERAL/GYN (CUSTOM PROCEDURE TRAY) ×1 IMPLANT
PAD ARMBOARD POSITIONER FOAM (MISCELLANEOUS) ×1 IMPLANT
PIN SAFETY STERILE (MISCELLANEOUS) IMPLANT
SOL PREP POV-IOD 4OZ 10% (MISCELLANEOUS) ×1 IMPLANT
STRIP CLOSURE SKIN 1/2X4 (GAUZE/BANDAGES/DRESSINGS) IMPLANT
SUT ETHILON 2 0 FS 18 (SUTURE) IMPLANT
SUT MNCRL AB 4-0 PS2 18 (SUTURE) ×1 IMPLANT
SUT MON AB 3-0 SH27 (SUTURE) IMPLANT
SUT VIC AB 3-0 SH 27X BRD (SUTURE) IMPLANT
SWAB COLLECTION DEVICE MRSA (MISCELLANEOUS) IMPLANT
SWAB CULTURE ESWAB REG 1ML (MISCELLANEOUS) IMPLANT
SYR BULB IRRIG 60ML STRL (SYRINGE) ×1 IMPLANT
TOWEL GREEN STERILE (TOWEL DISPOSABLE) ×1 IMPLANT
TOWEL GREEN STERILE FF (TOWEL DISPOSABLE) ×1 IMPLANT
TUBE CONNECTING 12X1/4 (SUCTIONS) ×1 IMPLANT

## 2024-03-07 NOTE — Transfer of Care (Signed)
 Immediate Anesthesia Transfer of Care Note  Patient: Ashley Robertson  Procedure(s) Performed: REMOVAL RIGHT CHEST IMPLANT (Right: Chest)  Patient Location: PACU  Anesthesia Type:General  Level of Consciousness: awake, alert , and oriented  Airway & Oxygen Therapy: Patient Spontanous Breathing  Post-op Assessment: Report given to RN and Post -op Vital signs reviewed and stable  Post vital signs: Reviewed and stable  Last Vitals:  Vitals Value Taken Time  BP 136/87 03/07/24 1215  Temp 36.6 C 03/07/24 1215  Pulse 100 03/07/24 1220  Resp 19 03/07/24 1220  SpO2 86 % 03/07/24 1220  Vitals shown include unfiled device data.  Last Pain:  Vitals:   03/07/24 0848  TempSrc: Oral         Complications: No notable events documented.

## 2024-03-07 NOTE — Anesthesia Preprocedure Evaluation (Addendum)
 Anesthesia Evaluation  Patient identified by MRN, date of birth, ID band Patient awake    Reviewed: Allergy & Precautions, NPO status , Patient's Chart, lab work & pertinent test results  Airway Mallampati: II  TM Distance: >3 FB Neck ROM: Full    Dental  (+) Upper Dentures   Pulmonary COPD,  COPD inhaler, former smoker   breath sounds clear to auscultation       Cardiovascular hypertension, Pt. on medications and Pt. on home beta blockers + CAD and + Peripheral Vascular Disease  + dysrhythmias  Rhythm:Regular Rate:Normal     Neuro/Psych    GI/Hepatic negative GI ROS, Neg liver ROS,,,  Endo/Other  negative endocrine ROS    Renal/GU negative Renal ROS     Musculoskeletal negative musculoskeletal ROS (+)    Abdominal   Peds  Hematology negative hematology ROS (+)   Anesthesia Other Findings   Reproductive/Obstetrics                             Anesthesia Physical Anesthesia Plan  ASA: 3  Anesthesia Plan: General   Post-op Pain Management: Ofirmev IV (intra-op)* and Toradol IV (intra-op)*   Induction: Intravenous  PONV Risk Score and Plan: 4 or greater and Ondansetron, Dexamethasone, Midazolam and Scopolamine patch - Pre-op  Airway Management Planned: LMA  Additional Equipment: None  Intra-op Plan:   Post-operative Plan: Extubation in OR  Informed Consent: I have reviewed the patients History and Physical, chart, labs and discussed the procedure including the risks, benefits and alternatives for the proposed anesthesia with the patient or authorized representative who has indicated his/her understanding and acceptance.     Dental advisory given  Plan Discussed with: CRNA  Anesthesia Plan Comments:        Anesthesia Quick Evaluation

## 2024-03-07 NOTE — Anesthesia Procedure Notes (Signed)
 Procedure Name: LMA Insertion Date/Time: 03/07/2024 11:22 AM  Performed by: Darryl Nestle, CRNAPre-anesthesia Checklist: Patient identified, Emergency Drugs available, Suction available and Patient being monitored Patient Re-evaluated:Patient Re-evaluated prior to induction Oxygen Delivery Method: Circle system utilized Preoxygenation: Pre-oxygenation with 100% oxygen Induction Type: IV induction Ventilation: Mask ventilation without difficulty LMA Size: 4.0 Tube type: Oral Number of attempts: 1 Placement Confirmation: positive ETCO2 and breath sounds checked- equal and bilateral Tube secured with: Tape Dental Injury: Teeth and Oropharynx as per pre-operative assessment

## 2024-03-07 NOTE — Progress Notes (Signed)
 Called to PACU for new onset Atrial fibrillation. Patient alert NAD. EKG confirmed atrial fibrillation rates currently 70-90s. Patient has received 20 mEq KCL IV in OR for K+2.6. Spoke with Dr. Allena [facility] who will evaluate patient and plan for admission. Husband updated.  Glenna Fellows, MD [facility] Plastic & Reconstructive Surgery  Office/ physician access line after hours [phone]

## 2024-03-07 NOTE — Anesthesia Postprocedure Evaluation (Signed)
 Anesthesia Post Note  Patient: Ashley Robertson  Procedure(s) Performed: REMOVAL RIGHT CHEST IMPLANT (Right: Chest)     Patient location during evaluation: PACU Anesthesia Type: General Level of consciousness: awake and alert Pain management: pain level controlled Vital Signs Assessment: post-procedure vital signs reviewed and stable Respiratory status: spontaneous breathing, nonlabored ventilation, respiratory function stable and patient connected to nasal cannula oxygen Cardiovascular status: blood pressure returned to baseline and stable Postop Assessment: no apparent nausea or vomiting Anesthetic complications: no  No notable events documented.  Last Vitals:  Vitals:   03/07/24 2032 03/07/24 2038  BP: 92/67 98/69  Pulse: 90 83  Resp: 20 19  Temp: 37.1 C   SpO2: 94% 97%    Last Pain:  Vitals:   03/07/24 2038  TempSrc:   PainSc: 0-No pain                 Kennieth Rad

## 2024-03-07 NOTE — Assessment & Plan Note (Signed)
 Optimize blood pressure control.  Will continue with Coreg currently.  Rest of the medications to be resumed once patient is back at her regular diet and environment.

## 2024-03-07 NOTE — Assessment & Plan Note (Addendum)
 Resume statin- crestor

## 2024-03-07 NOTE — Assessment & Plan Note (Addendum)
 Will monitor blood sugars on CMP and follow.

## 2024-03-07 NOTE — Assessment & Plan Note (Signed)
 [name] to be held, oncology consulted informed.

## 2024-03-07 NOTE — Assessment & Plan Note (Addendum)
 Breast implant removal today due to drainage and infection.  d/w surg about procedure pt is raw and area is friable with high risk of oozing and bleeding so they will let us know when to start eliquis.

## 2024-03-07 NOTE — H&P (Signed)
  Subjective  Patient ID: Ashley Robertson is a 68 y.o. female.  HPI  3 months post op placement right chest implant left breast reduction. Patient called via PAL this am to report development drainage and erythema right chest starting overnight. Patient noted scab earlier this week over right chest, had been treatment with Telfa. Denies fever.   Presented with palpable right breast mass. MMG/US showed mass right breast 11 o'clock, 6 cmfn measuring 2.1 x 3.3 x 2.0 cm. A 6 o clock mass present, 3 cmfn measuring 1.2 x 1.1 x 2.6 cm. No definitive abnormal axillary LN noted, however, a single LN with 3.1 mm cortex present.  Biopsies labeled right breast 6 o clock and 11o clock both with ILC, ER+/PR-, Her2+. Biopsy of LN with metastatic disease.  MRI demonstrated 8.5 x 8.3 x 5.9 cm area of mass-like enhancement in the right breast involving all 4 quadrants and containing 2 biopsy marker clip artifacts. Biopsy-proven metastatic LN in the right axilla. Possible metastatic IM LN noted.  Staging scans negative for distant disease. Completed neoadjuvant chemotherapy. Completed adjuvant radiation 11.22.2023. [place] 05/14/2022 with similar size mass noted. Completed [name] 06/2023  Final pathology 9.5 cm ILC 2/5 LN+. On [name] adjuvant through Sep 2024. On Anastrozole and neratinib.  Genetics with VUS in BRIP1  Staging scans noted 5 cm AAA . Vascular Surgery recommended repair . Had open AAA repair and remains on ASA. Off Eliquis for hx SVT.   Prior 57 G with Soma bra, reported that she did not feel really that large. Happy with pre mastectomy size. Right mastectomy 685 g Left breast mastopexy 74 g.   MMG left 12/2023  Reports quit smoking 3.30.23. Patient is a retired Landscape architect. Her daughter is an Charity fundraiser for [employer] now, float pool for ICU.  PMH significant for HTN, HLD, pre DM, COPD.   Review of Systems  Objective  Physical Exam Gen: alert oriented Pulm: clear to auscultation bilateral CV: normal heart  sounds Chest: Right chest erythema wound at inframammary fold  Assessment/Plan  Right breast cancer overlapping sites ER+ metastatic to LN Neoadjuvant chemotherapy S/p right SRM, ALND, prepectoral TE/ADM (Alloderm) reconstruction Adjuvant RT S/p removal port, right silicone implant exchange, left mastopexy  Plan removal right chest implant today. Patient has pain medication from prior surgery at home. Reviewed drain placement.   Glenna Fellows, MD [facility] Plastic & Reconstructive Surgery  Office/ physician access line after hours [phone]    Natrelle Smooth Round Soft Touch Moderate projection 485 ml implant placed REF [id number]

## 2024-03-07 NOTE — Assessment & Plan Note (Signed)
 Stable no wheezing on exam O2 sats within normal limits, as needed albuterol.  Patient has quit smoking about 2 years ago.

## 2024-03-07 NOTE — Assessment & Plan Note (Addendum)
 Pt has  rec a.fib after last year.  CHA2DS2/VAS Stroke Risk Points  Current as of 25 minutes ago     4 >= 2 Points: High Risk  1 to 1.99 Points: Medium Risk  0 Points: Low Risk    Last Change: N/A     Details    This score determines the patient's risk of having a stroke if the  patient has atrial fibrillation.   Points Metrics  0 Has Congestive Heart Failure:  No    Current as of 25 minutes ago  1 Has Vascular Disease:  Yes    Current as of 25 minutes ago  1 Has Hypertension:  Yes    Current as of 25 minutes ago  1 Age:  68    Current as of 25 minutes ago  0 Has Diabetes Excluding Gestational Diabetes:  No    Current as of 25 minutes ago  0 Had Stroke:  No  Had TIA:  No  Had Thromboembolism:  No    Current as of 25 minutes ago  1 Female:  Yes    Current as of 25 minutes ago    D/W pt about starting eliquis and she has taken it before and is comfortable taking it again. Per plastic MD we will follow drain output and then resume anticoagulation. We will cont DVT prophylaxis in meantime.

## 2024-03-07 NOTE — Assessment & Plan Note (Signed)
 Stable no complaints of chest pain.

## 2024-03-07 NOTE — Assessment & Plan Note (Signed)
 Replaced in the emergency room will recheck level and follow.  Attribute to patient's diuretic therapy in addition to chronic diarrhea secondary to neratinib.

## 2024-03-07 NOTE — Assessment & Plan Note (Addendum)
Continue coreg.

## 2024-03-07 NOTE — Op Note (Signed)
 Operative Note   DATE OF OPERATION: 4.5.2025  LOCATION: [facility]-outpatient  SURGICAL DIVISION: Plastic Surgery  PREOPERATIVE DIAGNOSES:  1. History breast cancer 2. Acquired absence breast 3. History therapeutic radiation 4. Open wound right chest 5. Cellulitis right chest  POSTOPERATIVE DIAGNOSES:  same  PROCEDURE:  Removal right chest implant  SURGEON: Glenna Fellows MD MBA  ASSISTANT: none  ANESTHESIA:  General.   EBL: 50 ml  COMPLICATIONS: None immediate.   INDICATIONS FOR PROCEDURE:  The patient, Ashley Robertson, is a 68 y.o. female born on [birth year], is here for removal right breast implant following development open wound drainage and cellulitis over last day.   FINDINGS: Exposed right implant. Removed intact smooth silicone implants. Removed near entirely of acellular dermis which was incorporated.  DESCRIPTION OF PROCEDURE:  The patient's operative site was marked with the patient in the preoperative area. The patient was taken to the operating room. SCDs were placed and IV antibiotics were given. Implant removed through open wound. The patient's operative site was prepped and draped in a sterile fashion. A time out was performed and all information was confirmed to be correct. Sharp debridement inframammary skin edges completed. Cultures obtained. Acellular dermis was dissected free from pectoralis muscle and mastectomy flap. Cavity irrigated with saline solution containing Ancef, gentamicin, and Betadine. Hemostasis obtained. Cavity irrigated with additional saline. 19 Fr JP placed and secured with 2-0 nylon. Closure inframammary fold incision completed with 3-0 monocryl in dermis and 4-0 monocryl running locking skin closure. Steris strips applied followed by dry dressing breast binder.   The patient was allowed to wake from anesthesia, extubated and taken to the recovery room in satisfactory condition.   SPECIMENS: culture right chest  DRAINS: 19 Fr JP right  chest  Glenna Fellows, MD [facility] Plastic & Reconstructive Surgery  Office/ physician access line after hours [phone]

## 2024-03-07 NOTE — H&P (Signed)
 History and Physical    Patient: Ashley Robertson WJX:[id number] DOB: 12/01/1956 DOA: 03/07/2024 DOS: the patient was seen and examined on 03/07/2024 PCP: Gweneth Dimitri, MD  Patient coming from:  PACU Chief complaint: No chief complaint on file.  HPI:  Ashley Robertson is a 68 y.o. female with past medical history  of  HER2+, ER+ R breast cancer diagnosed 01/2022, on Her2 therapy since 03/2022 s/p mastemctomy, XRT 09/11/2022-10/24/2022, HTN, admitted today for outpatient surgery with plastic surgery requested admission for management of electrolyte, new A-fib.  Patient had a right chest implant placed about 3 months ago and had erythema and drainage and presented with a palpable right breast mass.  Patient had removal of her right chest implants today.    ED Course: Pt in ed at bedside  is in no distress afebrile oriented vitals as below. Vital signs in the ED were notable for the following:  Vitals:   03/07/24 1330 03/07/24 1345 03/07/24 1409 03/07/24 1422  BP: 122/78 115/77  110/76  Pulse: 95 88    Temp:    98.8 F (37.1 C)  Resp: 14 (!) 21    Height:   5\' 9"  (1.753 m)   Weight:   64.1 kg   SpO2: 93% 92%  97%  TempSrc:    Oral  BMI (Calculated):   20.87   >>ED evaluation thus far shows: Not applicable patient is a admitted from PACU for outpatient surgery. Blood work today was a BMP showing potassium of 2.6 which was replaced by plastic surgery. CBC today was within normal limits.  >>While in the hospital, patient received the following: Medications  lactated ringers infusion ( Intravenous Restarted 03/07/24 1207)  fentaNYL (SUBLIMAZE) injection 25-50 mcg (has no administration in time range)  droperidol (INAPSINE) 2.5 MG/ML injection 0.625 mg (has no administration in time range)  ceFAZolin 1 g / gentamicin 80 mg in NS 500 mL surgical irrigation (has no administration in time range)  potassium chloride 10 mEq in 100 mL IVPB (20 mEq Intravenous Given 03/07/24 1152)  ceFAZolin (ANCEF)  IVPB 2g/100 mL premix (2 g Intravenous Given 03/07/24 1115)  chlorhexidine (PERIDEX) 0.12 % solution 15 mL (15 mLs Mouth/Throat Given 03/07/24 0858)    Or  Oral care mouth rinse ( Mouth Rinse See Alternative 03/07/24 0858)  ceFAZolin (ANCEF) 2-4 GM/100ML-% IVPB (  Override pull for Anesthesia 03/07/24 1121)  Review of Systems  All other systems reviewed and are negative.  Past Medical History:  Diagnosis Date   AAA (abdominal aortic aneurysm) (HCC) 01/11/2023   Allergy    Lisinopril and lipitor   Anxiety and depression    Aortic atherosclerosis (HCC) 03/2020   CT Chest: 2 V (LAD & LCx) Coronary Atherosclerosis, Aortic Atherosclerosis (no aneurysm).  Mild centrilobular emphysema with mild diffuse bronchial thickening; several scattered small solitary pulmonary nodules (largest 5.6 mm in anterior left upper lobe)   Breast cancer (HCC) 12/2021   Right breast ILC   Carotid artery plaque, bilateral 10/2015   Mild to moderate plaque L>R without significant stenosis   COPD (chronic obstructive pulmonary disease) (HCC)    Coronary Artery Calcification - Score 79    Coronary Calcium Score 79.  LAD and circumflex calcification noted.  Normal ascending aorta with mild calcification.   Current every day smoker    pt quit smoking April 2023   Dysrhythmia 01/2023   developed post op a-fib after AAA repair, converted with amiodarone   Emphysema lung (HCC)    Noted on  chest CT   Family history of breast cancer 02/21/2022   Family history of prostate cancer 02/21/2022   Hyperlipidemia    Hypertension    Controlled with amlodipine   Prediabetes    Skin cancer 2021   Remove 2022   Past Surgical History:  Procedure Laterality Date   ABDOMINAL AORTIC ANEURYSM REPAIR N/A 01/11/2023   Procedure: ANEURYSM ABDOMINAL AORTIC REPAIR;  Surgeon: Nada Libman, MD;  Location: [facility] OR;  Service: Vascular;  Laterality: N/A;   BREAST BIOPSY Right    times 3   BREAST RECONSTRUCTION WITH PLACEMENT OF TISSUE EXPANDER  AND ALLODERM Right 07/26/2022   Procedure: RIGHT BREAST RECONSTRUCTION WITH PLACEMENT OF TISSUE EXPANDER AND ALLODERM;  Surgeon: Glenna Fellows, MD;  Location: [facility] OR;  Service: Plastics;  Laterality: Right;   CESAREAN SECTION  1990, 1991   COSMETIC SURGERY  1981   Rhinoplasty   MASTECTOMY Right    MASTECTOMY W/ SENTINEL NODE BIOPSY Right 07/26/2022   Procedure: RIGHT MASTECTOMY, RIGHT AXILLARY SENTINEL NODE BIOPSY;  Surgeon: Emelia Loron, MD;  Location: [facility] OR;  Service: General;  Laterality: Right;  GEN & PEC BLOCK   MASTOPEXY Left 12/20/2023   Procedure: MASTOPEXY;  Surgeon: Glenna Fellows, MD;  Location: [facility];  Service: Plastics;  Laterality: Left;   PORT-A-CATH REMOVAL Left 12/20/2023   Procedure: REMOVAL PORT-A-CATH;  Surgeon: Glenna Fellows, MD;  Location: [facility];  Service: Plastics;  Laterality: Left;   PORTACATH PLACEMENT Left 03/06/2022   Procedure: INSERTION PORT-A-CATH;  Surgeon: Emelia Loron, MD;  Location: [facility];  Service: General;  Laterality: Left;   RADIOACTIVE SEED GUIDED AXILLARY SENTINEL LYMPH NODE Right 07/26/2022   Procedure: RADIOACTIVE SEED GUIDED AXILLARY SENTINEL LYMPH NODE DISSECTION;  Surgeon: Emelia Loron, MD;  Location: [facility];  Service: General;  Laterality: Right;   REMOVAL OF TISSUE EXPANDER AND PLACEMENT OF IMPLANT Right 12/20/2023   Procedure: REMOVAL OF TISSUE EXPANDER AND PLACEMENT OF SILICONE IMPLANT;  Surgeon: Glenna Fellows, MD;  Location:  SURGERY CENTER;  Service: Plastics;  Laterality: Right;   RHINOPLASTY  1981   TONSILLECTOMY  1978   TRANSTHORACIC ECHOCARDIOGRAM  10/2016   EF 55 to 60%.  Normal systolic and diastolic function.  No ASD/PFO   TUBAL LIGATION  1991    reports that she quit smoking about 23 months ago. Her smoking use included cigarettes. She started smoking about 41 years ago. She has a 40 pack-year smoking history. She has never used  smokeless tobacco. She reports current alcohol use of about 6.0 standard drinks of alcohol per week. She reports that she does not use drugs. Allergies  Allergen Reactions   Lisinopril Other (See Comments) and Cough    High potassium   Lipitor [Atorvastatin] Other (See Comments)    Myalgias   Family History  Problem Relation Age of Onset   Transient ischemic attack Mother 39   Parkinson's disease Father    Hyperlipidemia Father    Hypertension Father    Cancer - Other Father        sarcoma--left shoulder   Prostate cancer Father        dx after 44   Healthy Sister    Hypertension Sister    Lung cancer Brother        dx 75s   Healthy Brother    Healthy Brother    Skin cancer Maternal Aunt    Breast cancer Paternal Aunt        dx after  50   Lung cancer Maternal Grandmother        dx 60s   Liver cancer Maternal Grandfather        dx 22s   Breast cancer Niece        dx 30s   Prior to Admission medications   Medication Sig Start Date End Date Taking? Authorizing Provider  albuterol (PROVENTIL HFA) 108 (90 Base) MCG/ACT inhaler Inhale 1 to 2 puffs into the lungs every 6 hours as needed 01/25/22  Yes   alendronate (FOSAMAX) 70 MG tablet Take 1 tablet (70 mg total) by mouth once a week. Take with plain water 30 minutes before the first food, beverage, or medicine of the day. 02/24/24  Yes   amLODipine (NORVASC) 10 MG tablet Take 1 tablet (10 mg total) by mouth at bedtime. 02/24/24  Yes BranchAlben Spittle, MD  anastrozole (ARIMIDEX) 1 MG tablet Take 1 tablet (1 mg) by mouth daily. 05/09/23  Yes Malachy Mood, MD  aspirin 81 MG tablet Take 81 mg by mouth at bedtime.   Yes [provider]  Calcium Carb-Cholecalciferol (CALCIUM 500 + D3 PO) Take 1 tablet by mouth daily.   Yes [provider]  carvedilol (COREG) 6.25 MG tablet Take 1 tablet (6.25 mg total) by mouth 2 (two) times daily. 09/30/23  Yes Marykay Lex, MD  Cholecalciferol (VITAMIN D) 50 MCG (2000 UT) CAPS Take 1  capsule by mouth daily. 11/29/22  Yes Malachy Mood, MD  ezetimibe (ZETIA) 10 MG tablet Take 1 tablet (10 mg total) by mouth daily. 01/23/24  Yes Maisie Fus, MD  FLUoxetine (PROZAC) 20 MG capsule Take 1 capsule (20 mg total) by mouth daily. 04/11/23  Yes   Fluticasone-Umeclidin-Vilant (TRELEGY ELLIPTA) 200-62.5-25 MCG/ACT AEPB Inhale 1 puff into the lungs daily. 12/27/23  Yes Mannam, Praveen, MD  loratadine (CLARITIN) 10 MG tablet Take 10 mg by mouth daily as needed for allergies.   Yes [provider]  methocarbamol (ROBAXIN) 500 MG tablet Take 1 tablet (500 mg total) by mouth 3 (three) times daily as needed for muscle spasms. 12/05/23  Yes   [name] Maleate (NERLYNX) 40 MG tablet Take 6 tablets (240 mg total) by mouth daily. Take with food. 02/11/24  Yes Malachy Mood, MD  prenatal vitamin w/FE, FA (PRENATAL 1 + 1) 27-1 MG TABS tablet Take 1 tablet by mouth daily at 12 noon.   Yes [provider]  rosuvastatin (CRESTOR) 5 MG tablet Take 1 tablet by mouth daily at bedtime ,except on Monday, Wednesday and Fridays take 2 tablets at bedtime as directed 11/21/23  Yes Marykay Lex, MD  Specialty Vitamins Products (MAGNESIUM, AMINO ACID CHELATE,) 133 MG tablet Take 1 tablet by mouth at bedtime.   Yes [provider]  sulfamethoxazole-trimethoprim (BACTRIM DS) 800-160 MG tablet Take 1 tablet by mouth 2 (two) times daily. 03/07/24  Yes Glenna Fellows, MD  tacrolimus (PROTOPIC) 0.1 % ointment Apply to affected area 1-2 times daily x2-4 weeks then as needed for flares 01/16/24  Yes   hydrochlorothiazide (HYDRODIURIL) 25 MG tablet Take 1 tablet (25 mg total) by mouth daily. Patient not taking: Reported on 02/05/2024 10/06/20 12/13/20  Marykay Lex, MD  lisinopril (ZESTRIL) 5 MG tablet Take 1 tablet (5 mg total) by mouth daily. Patient not taking: Reported on 02/05/2024 08/30/20 09/10/20  Marykay Lex, MD  Vitals:   03/07/24 1330 03/07/24 1345 03/07/24 1409 03/07/24 1422  BP: 122/78 115/77  110/76  Pulse: 95 88    Resp: 14 (!) 21    Temp:    98.8 F (37.1 C)  TempSrc:    Oral  SpO2: 93% 92%  97%  Weight:   64.1 kg   Height:   5\' 9"  (1.753 m)    Physical Exam Vitals and nursing note reviewed.  Constitutional:      General: She is not in acute distress. HENT:     Head: Normocephalic and atraumatic.     Right Ear: Hearing normal.     Left Ear: Hearing normal.     Nose: Nose normal. No nasal deformity.     Mouth/Throat:     Lips: Pink.     Tongue: No lesions.     Pharynx: Oropharynx is clear.  Eyes:     General: Lids are normal.     Extraocular Movements: Extraocular movements intact.  Cardiovascular:     Rate and Rhythm: Normal rate and regular rhythm.     Heart sounds: Murmur heard.  Pulmonary:     Effort: Pulmonary effort is normal.     Breath sounds: Normal breath sounds.  Abdominal:     General: Bowel sounds are normal. There is no distension.     Palpations: Abdomen is soft. There is no mass.     Tenderness: There is no abdominal tenderness.  Musculoskeletal:     Right lower leg: No edema.     Left lower leg: No edema.  Skin:    General: Skin is warm.  Neurological:     General: No focal deficit present.     Mental Status: She is alert and oriented to person, place, and time.     Cranial Nerves: Cranial nerves 2-12 are intact.  Psychiatric:        Attention and Perception: Attention normal.        Mood and Affect: Mood normal.        Speech: Speech normal.        Behavior: Behavior normal. Behavior is cooperative.     Labs on Admission: I have personally reviewed following labs and imaging studies CBC: Recent Labs  Lab 03/07/24 0858  WBC 8.9  HGB 13.1  HCT 38.8  MCV 98.5  PLT 154   Basic Metabolic Panel: Recent Labs  Lab 03/07/24 0858 03/07/24 1444  NA 135  132* 134*  K 2.7*  2.6* 3.4*  CL 100  95* 96*  CO2 26  22 26   GLUCOSE 118*   122* 145*  BUN 16  14 12   CREATININE 0.62  0.73 0.77  CALCIUM 9.0  9.0 8.8*  MG 2.1  --    GFR: Estimated Creatinine Clearance: 68.1 mL/min (by C-G formula based on SCr of 0.77 mg/dL). Liver Function Tests: Recent Labs  Lab 03/07/24 1444  AST 19  ALT 18  ALKPHOS 107  BILITOT 0.7  PROT 6.8  ALBUMIN 2.8*   No results for input(s): "LIPASE", "AMYLASE" in the last 168 hours. No results for input(s): "AMMONIA" in the last 168 hours. Coagulation Profile: No results for input(s): "INR", "PROTIME" in the last 168 hours. Cardiac Enzymes: No results for input(s): "CKTOTAL", "CKMB", "CKMBINDEX", "TROPONINI" in the last 168 hours. BNP (last 3 results) No results for input(s): "PROBNP" in the last 8760 hours. HbA1C: No results for input(s): "HGBA1C" in the last 72 hours. CBG: No results for input(s): "GLUCAP" in the last  168 hours. Lipid Profile: No results for input(s): "CHOL", "HDL", "LDLCALC", "TRIG", "CHOLHDL", "LDLDIRECT" in the last 72 hours. Thyroid Function Tests: No results for input(s): "TSH", "T4TOTAL", "FREET4", "T3FREE", "THYROIDAB" in the last 72 hours. Anemia Panel: No results for input(s): "VITAMINB12", "FOLATE", "FERRITIN", "TIBC", "IRON", "RETICCTPCT" in the last 72 hours. Urine analysis:    Component Value Date/Time   COLORURINE STRAW (A) 01/04/2023 0952   APPEARANCEUR CLEAR 01/04/2023 0952   LABSPEC 1.004 (L) 01/04/2023 0952   PHURINE 6.0 01/04/2023 0952   GLUCOSEU NEGATIVE 01/04/2023 0952   HGBUR NEGATIVE 01/04/2023 0952   BILIRUBINUR NEGATIVE 01/04/2023 0952   KETONESUR NEGATIVE 01/04/2023 0952   PROTEINUR NEGATIVE 01/04/2023 0952   NITRITE NEGATIVE 01/04/2023 0952   LEUKOCYTESUR NEGATIVE 01/04/2023 0952   Radiological Exams on Admission: No results found. Data Reviewed: Relevant notes from primary care and specialist visits, past discharge summaries as available in EHR, including Care Everywhere. Prior diagnostic testing as pertinent to current  admission diagnoses, Updated medications and problem lists for reconciliation ED course, including vitals, labs, imaging, treatment and response to treatment,Triage notes, nursing and pharmacy notes and ED provider's notes Notable results as noted in HPI.Discussed case with EDMD/ ED APP/ or Specialty MD on call and as needed. Assessment & Plan Atrial fibrillation (HCC) Pt has  rec a.fib after last year.  CHA2DS2/VAS Stroke Risk Points  Current as of 25 minutes ago     4 >= 2 Points: High Risk  1 to 1.99 Points: Medium Risk  0 Points: Low Risk    Last Change: N/A     Details    This score determines the patient's risk of having a stroke if the  patient has atrial fibrillation.   Points Metrics  0 Has Congestive Heart Failure:  No    Current as of 25 minutes ago  1 Has Vascular Disease:  Yes    Current as of 25 minutes ago  1 Has Hypertension:  Yes    Current as of 25 minutes ago  1 Age:  3    Current as of 25 minutes ago  0 Has Diabetes Excluding Gestational Diabetes:  No    Current as of 25 minutes ago  0 Had Stroke:  No  Had TIA:  No  Had Thromboembolism:  No    Current as of 25 minutes ago  1 Female:  Yes    Current as of 25 minutes ago    D/W pt about starting eliquis and she has taken it before and is comfortable taking it again. Per plastic MD we will follow drain output and then resume anticoagulation. We will cont DVT prophylaxis in meantime.    Essential hypertension Continue coreg.  Hyperlipidemia with target LDL less than 100 Resume statin- crestor Prediabetes Will monitor blood sugars on CMP and follow.  Coronary artery disease Stable no complaints of chest pain. Malignant neoplasm of overlapping sites of right breast in female, estrogen receptor positive (HCC) [name] to be held, oncology consulted informed. Pulmonary emphysema (HCC) Stable no wheezing on exam O2 sats within normal limits, as needed albuterol.  Patient has quit smoking about 2 years ago. S/P  AAA repair Optimize blood pressure control.  Will continue with Coreg currently.  Rest of the medications to be resumed once patient is back at her regular diet and environment. S/P mastectomy, right Breast implant removal today due to drainage and infection.  d/w surg about procedure pt is raw and area is friable with high risk of oozing and  bleeding so they will let us know when to start eliquis.   Hypokalemia Replaced in the emergency room will recheck level and follow.  Attribute to patient's diuretic therapy in addition to chronic diarrhea secondary to neratinib.  DVT prophylaxis:  SCDs Eliquis  per plastic md Consults:  None Advance Care Planning:    Code Status: Prior   Family Communication:  None Disposition Plan:  Home Severity of Illness: The appropriate patient status for this patient is INPATIENT. Inpatient status is judged to be reasonable and necessary in order to provide the required intensity of service to ensure the patient's safety. The patient's presenting symptoms, physical exam findings, and initial radiographic and laboratory data in the context of their chronic comorbidities is felt to place them at high risk for further clinical deterioration. Furthermore, it is not anticipated that the patient will be medically stable for discharge from the hospital within 2 midnights of admission.   * I certify that at the point of admission it is my clinical judgment that the patient will require inpatient hospital care spanning beyond 2 midnights from the point of admission due to high intensity of service, high risk for further deterioration and high frequency of surveillance required.*  Unresulted Labs (From admission, onward)     Start     Ordered   03/08/24 0500  CBC with Differential/Platelet  Tomorrow morning,   R        03/07/24 1854   03/08/24 0500  Comprehensive metabolic panel with GFR  Tomorrow morning,   R        03/07/24 1854   03/08/24 0500  Magnesium  Tomorrow  morning,   R        03/07/24 1854   03/08/24 0500  Phosphorus  Tomorrow morning,   R        03/07/24 1854   03/07/24 1856  Hemoglobin A1c  Add-on,   AD        03/07/24 1855   03/07/24 1856  T4, free  Add-on,   AD        03/07/24 1855   03/07/24 1856  TSH  Add-on,   AD        03/07/24 1855            Orders Placed This Encounter  Procedures   Aerobic/Anaerobic Culture w Gram Stain (surgical/deep wound)   CBC   Basic metabolic panel   Basic metabolic panel   Magnesium   Comprehensive metabolic panel   CBC with Differential/Platelet   Comprehensive metabolic panel with GFR   Magnesium   Phosphorus   Hemoglobin A1c   T4, free   TSH   Diet Heart Room service appropriate? Yes; Fluid consistency: Thin   Pre-admission testing diagnosis   Pre-admission testing diagnosis   Anesthesia Preoperative Order   Resume previous diet   Driving Restrictions   Lifting restrictions   Call MD for:  temperature >100.5   Call MD for:  redness, tenderness, or signs of infection (pain, swelling, bleeding, redness, odor or green/yellow discharge around incision site)   Discharge instructions   Cardiac Monitoring - Continuous Indefinite   JP/Blake drain to bulb suction   Strip bulb suction   Consult to oncology Consult Timeframe: ROUTINE - requires response within 24 hours; Reason for Consult? breast cancer / neratinib.   Admit to Inpatient (patient's expected length of stay will be greater than 2 midnights or inpatient only procedure)    Author: Gertha Calkin, MD 12 pm -8 pm.  03/07/2024 6:56 PM Please note for questions,concerns,abnormal labs, or critical results past 8pm please contact [facility] floor coverage provider from 7 PM- 7 AM. For on call review [email or web address], username [email or web address] and PW: your phone number.

## 2024-03-08 ENCOUNTER — Encounter (HOSPITAL_COMMUNITY): Payer: Self-pay | Admitting: Plastic Surgery

## 2024-03-08 ENCOUNTER — Other Ambulatory Visit: Payer: Self-pay

## 2024-03-08 DIAGNOSIS — L03313 Cellulitis of chest wall: Secondary | ICD-10-CM | POA: Diagnosis not present

## 2024-03-08 DIAGNOSIS — I4891 Unspecified atrial fibrillation: Secondary | ICD-10-CM

## 2024-03-08 LAB — CBC WITH DIFFERENTIAL/PLATELET
Abs Immature Granulocytes: 0.02 10*3/uL (ref 0.00–0.07)
Basophils Absolute: 0 10*3/uL (ref 0.0–0.1)
Basophils Relative: 0 %
Eosinophils Absolute: 0 10*3/uL (ref 0.0–0.5)
Eosinophils Relative: 0 %
HCT: 36.2 % (ref 36.0–46.0)
Hemoglobin: 12.5 g/dL (ref 12.0–15.0)
Immature Granulocytes: 0 %
Lymphocytes Relative: 12 %
Lymphs Abs: 0.6 10*3/uL — ABNORMAL LOW (ref 0.7–4.0)
MCH: 33.4 pg (ref 26.0–34.0)
MCHC: 34.5 g/dL (ref 30.0–36.0)
MCV: 96.8 fL (ref 80.0–100.0)
Monocytes Absolute: 0.4 10*3/uL (ref 0.1–1.0)
Monocytes Relative: 9 %
Neutro Abs: 3.8 10*3/uL (ref 1.7–7.7)
Neutrophils Relative %: 79 %
Platelets: 153 10*3/uL (ref 150–400)
RBC: 3.74 MIL/uL — ABNORMAL LOW (ref 3.87–5.11)
RDW: 13.7 % (ref 11.5–15.5)
WBC: 4.8 10*3/uL (ref 4.0–10.5)
nRBC: 0 % (ref 0.0–0.2)

## 2024-03-08 LAB — COMPREHENSIVE METABOLIC PANEL WITH GFR
ALT: 15 U/L (ref 0–44)
AST: 18 U/L (ref 15–41)
Albumin: 2.5 g/dL — ABNORMAL LOW (ref 3.5–5.0)
Alkaline Phosphatase: 99 U/L (ref 38–126)
Anion gap: 10 (ref 5–15)
BUN: 15 mg/dL (ref 8–23)
CO2: 30 mmol/L (ref 22–32)
Calcium: 8.5 mg/dL — ABNORMAL LOW (ref 8.9–10.3)
Chloride: 102 mmol/L (ref 98–111)
Creatinine, Ser: 0.73 mg/dL (ref 0.44–1.00)
GFR, Estimated: >60 mL/min (ref >60)
Glucose, Bld: 166 mg/dL — ABNORMAL HIGH (ref 70–99)
Potassium: 4.8 mmol/L (ref 3.5–5.1)
Sodium: 142 mmol/L (ref 135–145)
Total Bilirubin: 0.6 mg/dL (ref 0.0–1.2)
Total Protein: 6.3 g/dL — ABNORMAL LOW (ref 6.5–8.1)

## 2024-03-08 LAB — MAGNESIUM: Magnesium: 2.3 mg/dL (ref 1.7–2.4)

## 2024-03-08 LAB — PHOSPHORUS: Phosphorus: 2.9 mg/dL (ref 2.5–4.6)

## 2024-03-08 MED ORDER — APIXABAN 5 MG PO TABS
5.0000 mg | ORAL_TABLET | Freq: Two times a day (BID) | ORAL | Status: DC
  Administered 2024-03-08: 5 mg via ORAL
  Filled 2024-03-08 (×2): qty 1

## 2024-03-08 MED ORDER — CEFAZOLIN SODIUM-DEXTROSE 1-4 GM/50ML-% IV SOLN
1.0000 g | Freq: Three times a day (TID) | INTRAVENOUS | Status: DC
  Filled 2024-03-08: qty 50

## 2024-03-08 MED ORDER — APIXABAN 5 MG PO TABS
5.0000 mg | ORAL_TABLET | Freq: Two times a day (BID) | ORAL | 3 refills | Status: AC
  Filled 2024-03-08 – 2024-03-09 (×2): qty 180, 90d supply, fill #0
  Filled 2024-06-02: qty 180, 90d supply, fill #1
  Filled 2024-09-07: qty 180, 90d supply, fill #2
  Filled 2024-12-04: qty 180, 90d supply, fill #3

## 2024-03-08 NOTE — Discharge Summary (Signed)
 Physician Discharge Summary   Patient: Ashley Robertson MRN: [id number] DOB: 1956/06/11  Admit date:     03/07/2024  Discharge date: 03/08/24  Discharge Physician: Alberteen Sam   PCP: Gweneth Dimitri, MD     Recommendations at discharge:  Follow up with Dr. Marzetta Board for post-surgical care Follow up with Cardiology for recurrent Afib on new Eliquis Please obtain CBC in 1 week     Discharge Diagnoses: Principal Problem:   Atrial fibrillation (HCC) Active Problems:   Essential hypertension   Hyperlipidemia with target LDL less than 100   Prediabetes   Malignant neoplasm of overlapping sites of right breast in female, estrogen receptor positive (HCC)   S/P mastectomy, right   Coronary artery disease   Pulmonary emphysema (HCC)   S/P AAA repair   Hypokalemia      Hospital Course: 68 y.o. F with BrCA s/p mastectomy, HTN, AAA s/p repair with hx post-op Afib no longer on [facility] who presented for elective breast augmentation.  Post-op noted to be in Afib and so hospitalist service asked to evaluate overnight.     * Paroxysmal atrial fibrillation Patient has a history of atrial fibrillation, and is on stroke reduction anticoagulation in the past.  However her atrial fibrillation was transient, did not recur, and so she was taken off anticoagulation at some point in the past.  Here she returned to atrial fibrillation, but is asymptomatic.  TSH normal, echocardiogram unremarkable.  CHA2DS2-VASc score 4.  She was resumed on Eliquis, and prescription was provided.  Her rate was controlled off nodal blocking agents.  She has cardiology follow up.     Hypokalemia Supplemented and resolved  S/P AAA repair   Pulmonary emphysema (HCC) Asymptomatic  Coronary artery disease   S/P mastectomy, right Follow-up with Dr. Marzetta Board  Malignant neoplasm of overlapping sites of right breast in female, estrogen receptor positive [facility]) Oncology are holding her Nerlatinib               [facility] Controlled Substances Registry was reviewed for this patient prior to discharge.     Disposition: Home Diet recommendation:  Cardiac diet  DISCHARGE MEDICATION: Allergies as of 03/08/2024       Reactions   Lisinopril Other (See Comments), Cough   High potassium   Lipitor [atorvastatin] Other (See Comments)   Myalgias        Medication List     PAUSE taking these medications    [name] Maleate 40 MG tablet Wait to take this until your doctor or other care provider tells you to start again. Commonly known as: NERLYNX Take 6 tablets (240 mg total) by mouth daily. Take with food.       STOP taking these medications    aspirin 81 MG tablet       TAKE these medications    albuterol 108 (90 Base) MCG/ACT inhaler Commonly known as: Proventil HFA Inhale 1 to 2 puffs into the lungs every 6 hours as needed   alendronate 70 MG tablet Commonly known as: FOSAMAX Take 1 tablet (70 mg total) by mouth once a week. Take with plain water 30 minutes before the first food, beverage, or medicine of the day.   amLODipine 10 MG tablet Commonly known as: NORVASC Take 1 tablet (10 mg total) by mouth at bedtime.   anastrozole 1 MG tablet Commonly known as: ARIMIDEX Take 1 tablet (1 mg) by mouth daily.   apixaban 5 MG Tabs tablet Commonly known as: ELIQUIS Take 1  tablet (5 mg total) by mouth 2 (two) times daily.   CALCIUM 500 + D3 PO Take 1 tablet by mouth daily.   carvedilol 6.25 MG tablet Commonly known as: COREG Take 1 tablet (6.25 mg total) by mouth 2 (two) times daily.   ezetimibe 10 MG tablet Commonly known as: ZETIA Take 1 tablet (10 mg total) by mouth daily.   FLUoxetine 20 MG capsule Commonly known as: PROZAC Take 1 capsule (20 mg total) by mouth daily.   loratadine 10 MG tablet Commonly known as: CLARITIN Take 10 mg by mouth daily as needed for allergies.   magnesium (amino acid chelate) 133 MG tablet Take 1  tablet by mouth at bedtime.   methocarbamol 500 MG tablet Commonly known as: ROBAXIN Take 1 tablet (500 mg total) by mouth 3 (three) times daily as needed for muscle spasms.   prenatal vitamin w/FE, FA 27-1 MG Tabs tablet Take 1 tablet by mouth daily at 12 noon.   rosuvastatin 5 MG tablet Commonly known as: CRESTOR Take 1 tablet by mouth daily at bedtime ,except on Monday, Wednesday and Fridays take 2 tablets at bedtime as directed   sulfamethoxazole-trimethoprim 800-160 MG tablet Commonly known as: Bactrim DS Take 1 tablet by mouth 2 (two) times daily.   tacrolimus 0.1 % ointment Commonly known as: PROTOPIC Apply to affected area 1-2 times daily x2-4 weeks then as needed for flares   Trelegy Ellipta 200-62.5-25 MCG/ACT Aepb Generic drug: Fluticasone-Umeclidin-Vilant Inhale 1 puff into the lungs daily.   Vitamin D 50 MCG (2000 UT) Caps Take 1 capsule by mouth daily.        Follow-up Information     Glenna Fellows, MD Follow up on 03/12/2024.   Specialty: Plastic Surgery Why: 1:30 pm Contact information: [address] [place] [address] [phone]         Maisie Fus, MD. Schedule an appointment as soon as possible for a visit.   Specialty: Cardiology Contact information: [address] [place] [address] [phone]                 Discharge Instructions     Call MD for:  redness, tenderness, or signs of infection (pain, swelling, bleeding, redness, odor or green/yellow discharge around incision site)   Complete by: As directed    Call MD for:  temperature >100.5   Complete by: As directed    Discharge instructions   Complete by: As directed    **IMPORTANT DISCHARGE INSTRUCTIONS**   From Dr. Maryfrances Bunnell: You were admitted for reconstruction surgery  Per Dr. Marzetta Board: [place] to remove dressings and shower starting 03/08/2024. Soap and water ok, pat incisions dry.  Do not let drains dangle in shower, attach to  lanyard or similar. Strip and record drain twice daily and bring log to clinic visit.  Breast binder or soft compression bra all other times.  Ok to raise arms above shoulders for bathing and dressing.  No house yard work or exercise until cleared by MD.    Bonita Quin were ALSO found to have recurrent Afib  START the medicine apixaban/Eliquis 5 mg twice daily  I have sent this to [facility] pharmacy  If you notice abnormal bleeding, rectal bleeding or black and "tar-like" stools, call your doctor immediately  CONTINUE carvedilol/Coreg 6.25 mg twice daily  STOP aspirin (You don't need both aspirin and apixaban)  Go see Dr. Wyline Mood or her partners within 1 month  If you notice abnormal heart RACING, or  feeling out of breath or deep chest pain, come to the ER   Driving Restrictions   Complete by: As directed    No driving if taking prescription pain medication   Lifting restrictions   Complete by: As directed    No lifting > 5-10 lbs through follow up visit   Resume previous diet   Complete by: As directed        Discharge Exam: Filed Weights   03/07/24 0848 03/07/24 1409  Weight: 65.3 kg 64.1 kg  BP 129/81 (BP Location: Left Arm)   Pulse 77   Temp 98.3 F (36.8 C) (Oral)   Resp 18   Ht 5\' 9"  (1.753 m)   Wt 64.1 kg   SpO2 97%   BMI 20.88 kg/m    General: Pt is alert, awake, not in acute distress Cardiovascular: Irregular, nl S1-S2, no murmurs appreciated.   No LE edema.   Respiratory: Normal respiratory rate and rhythm.  CTAB without rales or wheezes. Abdominal: Abdomen soft and non-tender.  No distension or HSM.   Neuro/Psych: Strength symmetric in upper and lower extremities.  Judgment and insight appear normal.   Condition at discharge: good  The results of significant diagnostics from this hospitalization (including imaging, microbiology, ancillary and laboratory) are listed below for reference.   Imaging Studies: ECHOCARDIOGRAM LIMITED Result Date: 03/04/2024     ECHOCARDIOGRAM LIMITED REPORT   Patient Name:   Ashley Robertson Date of Exam: 03/04/2024 Medical Rec #:  [id number]       Height:       69.0 in Accession #:    [id number]      Weight:       145.3 lb Date of Birth:  [birth year]      BSA:          1.804 m Patient Age:    67 years        BP:           140/70 mmHg Patient Gender: F               HR:           60 bpm. Exam Location:  Inpatient Procedure: Limited Echo, Cardiac Doppler, Limited Color Doppler and Strain            Analysis (Both Spectral and Color Flow Doppler were utilized during            procedure). Indications:    Chemo  History:        Patient has prior history of Echocardiogram examinations, most                 recent 11/18/2023. CAD; Risk Factors:Hypertension.  Sonographer:    Amy Chionchio Referring Phys: [id number] MARY E BRANCH IMPRESSIONS  1. Left ventricular ejection fraction, by estimation, is 50 to 55%. The left ventricle has low normal function. The left ventricle has no regional wall motion abnormalities.  2. Right ventricular systolic function is normal. The right ventricular size is normal.  3. The aortic valve was not well visualized. Aortic valve regurgitation is not visualized. Mild aortic valve stenosis. Vmax 2.5 m/s, MG , AVA 1.5 cm^2, DI 0.52 FINDINGS  Left Ventricle: Left ventricular ejection fraction, by estimation, is 50 to 55%. The left ventricle has low normal function. The left ventricle has no regional wall motion abnormalities. Global longitudinal strain performed but not reported based on interpreter judgement due to suboptimal tracking. Right Ventricle: The right ventricular size is normal. No increase  in right ventricular wall thickness. Right ventricular systolic function is normal. Pericardium: There is no evidence of pericardial effusion. Aortic Valve: The aortic valve was not well visualized. Aortic valve regurgitation is not visualized. Mild aortic stenosis is present. Aortic valve mean gradient measures 10.5 mmHg.  Aortic valve peak gradient measures 20.4 mmHg. Aortic valve area, by VTI  measures 1.55 cm. LEFT VENTRICLE PLAX 2D LVOT diam:     1.90 cm LV SV:         64 LV SV Index:   35 LVOT Area:     2.84 cm  LV Volumes (MOD) LV vol d, MOD A2C: 91.1 ml LV vol d, MOD A4C: 82.9 ml LV vol s, MOD A2C: 35.7 ml LV vol s, MOD A4C: 48.9 ml LV SV MOD A2C:     55.4 ml LV SV MOD A4C:     82.9 ml LV SV MOD BP:      42.8 ml RIGHT VENTRICLE RV Basal diam:  3.40 cm TAPSE (M-mode): 2.0 cm AORTIC VALVE AV Area (Vmax):    1.46 cm AV Area (Vmean):   1.42 cm AV Area (VTI):     1.55 cm AV Vmax:           226.00 cm/s AV Vmean:          148.500 cm/s AV VTI:            0.412 m AV Peak Grad:      20.4 mmHg AV Mean Grad:      10.5 mmHg LVOT Vmax:         116.00 cm/s LVOT Vmean:        74.600 cm/s LVOT VTI:          0.225 m LVOT/AV VTI ratio: 0.55  SHUNTS Systemic VTI:  0.22 m Systemic Diam: 1.90 cm Epifanio Lesches MD Electronically signed by Epifanio Lesches MD Signature Date/Time: 03/04/2024/3:48:43 PM    Final     Microbiology: Results for orders placed or performed during the hospital encounter of 03/07/24  Aerobic/Anaerobic Culture w Gram Stain (surgical/deep wound)     Status: None (Preliminary result)   Collection Time: 03/07/24 11:35 AM   Specimen: Wound; Abscess  Result Value Ref Range Status   Specimen Description WOUND  Final   Special Requests RIGHT CHEST WALL  Final   Gram Stain   Final    MODERATE WBC PRESENT, PREDOMINANTLY PMN MODERATE GRAM POSITIVE COCCI    Culture   Final    FEW STAPHYLOCOCCUS AUREUS SUSCEPTIBILITIES TO FOLLOW Performed at [facility], [address] [place], [place] [address]    Report Status PENDING  Incomplete    Labs: CBC: Recent Labs  Lab 03/07/24 0858 03/08/24 0417  WBC 8.9 4.8  NEUTROABS  --  3.8  HGB 13.1 12.5  HCT 38.8 36.2  MCV 98.5 96.8  PLT 154 153   Basic Metabolic Panel: Recent Labs  Lab 03/07/24 0858 03/07/24 1444 03/08/24 0417  NA 135  132*  134* 142  K 2.7*  2.6* 3.4* 4.8  CL 100  95* 96* 102  CO2 26  22 26 30   GLUCOSE 118*  122* 145* 166*  BUN 16  14 12 15   CREATININE 0.62  0.73 0.77 0.73  CALCIUM 9.0  9.0 8.8* 8.5*  MG 2.1  --  2.3  PHOS  --   --  2.9   Liver Function Tests: Recent Labs  Lab 03/07/24 1444 03/08/24 0417  AST 19 18  ALT  18 15  ALKPHOS 107 99  BILITOT 0.7 0.6  PROT 6.8 6.3*  ALBUMIN 2.8* 2.5*   CBG: No results for input(s): "GLUCAP" in the last 168 hours.  Discharge time spent: approximately 45 minutes spent on discharge counseling, evaluation of patient on day of discharge, and coordination of discharge planning with nursing, social work, pharmacy and case management  Signed: Alberteen Sam, MD [facility] 03/08/2024

## 2024-03-08 NOTE — Discharge Instructions (Signed)
 Information on my medicine - ELIQUIS (apixaban)  This medication education was reviewed with me or my healthcare representative as part of my discharge preparation.  Eliquis was prescribed for you to reduce the risk of forming blood clots that can cause a stroke if you have a medical condition called atrial fibrillation (a type of irregular heartbeat) OR to reduce the risk of a blood clots forming after orthopedic surgery.  What do You need to know about Eliquis ? Take your Eliquis TWICE DAILY - one tablet in the morning and one tablet in the evening with or without food.  It would be best to take the doses about the same time each day.  If you have difficulty swallowing the tablet whole please discuss with your pharmacist how to take the medication safely.  Take Eliquis exactly as prescribed by your doctor and DO NOT stop taking Eliquis without talking to the doctor who prescribed the medication.  Stopping may increase your risk of developing a new clot or stroke.  Refill your prescription before you run out.  After discharge, you should have regular check-up appointments with your healthcare provider that is prescribing your Eliquis.  In the future your dose may need to be changed if your kidney function or weight changes by a significant amount or as you get older.  What do you do if you miss a dose? If you miss a dose, take it as soon as you remember on the same day and resume taking twice daily.  Do not take more than one dose of ELIQUIS at the same time.  Important Safety Information A possible side effect of Eliquis is bleeding. You should call your healthcare provider right away if you experience any of the following: Bleeding from an injury or your nose that does not stop. Unusual colored urine (red or dark brown) or unusual colored stools (red or black). Unusual bruising for unknown reasons. A serious fall or if you hit your head (even if there is no bleeding).  Some  medicines may interact with Eliquis and might increase your risk of bleeding or clotting while on Eliquis. To help avoid this, consult your healthcare provider or pharmacist prior to using any new prescription or non-prescription medications, including herbals, vitamins, non-steroidal anti-inflammatory drugs (NSAIDs) and supplements.  This website has more information on Eliquis (apixaban): http://www.eliquis.com/eliquis/home

## 2024-03-08 NOTE — Plan of Care (Signed)

## 2024-03-08 NOTE — Hospital Course (Signed)
 68 y.o. F with BrCA s/p mastectomy, HTN, AAA s/p repair with hx post-op Afib no longer on [facility] who presented for elective breast augmentation.  Post-op noted to be in Afib and so hospitalist service asked to evaluate overnight.

## 2024-03-08 NOTE — Care Management CC44 (Signed)
 Condition Code 44 Documentation Completed  Patient Details  Name: NASHIKA COKER MRN: [id number] Date of Birth: Sep 11, 1956   Condition Code 44 given:  Yes Patient signature on Condition Code 44 notice:  Yes Documentation of 2 MD's agreement:  Yes Code 44 added to claim:  Yes    Isaias Cowman, RN 03/08/2024, 10:35 AM

## 2024-03-08 NOTE — Progress Notes (Addendum)
 Plastc Surgery POD#1 removal right chest implant  Temp:  [97.8 F (36.6 C)-98.8 F (37.1 C)] 98.3 F (36.8 C) (04/06 0822) Pulse Rate:  [77-103] 77 (04/06 0822) Resp:  [14-23] 18 (04/06 0822) BP: (92-136)/(67-91) 129/81 (04/06 0822) SpO2:  [91 %-97 %] 97 % (04/06 0822) Weight:  [64.1 kg-65.3 kg] 64.1 kg (04/05 1409)   JP 90 post op PO 720 WBC 4.8 Hb 12.5 K+ 4.8  PE Alert NAD Chest:flat with ecchymoses/hyperemia consistent with recent surgery, radiation Drain: watery serosanguinous    A/P Remains in A fib- ok to start Eliquis from Plastic standpoint Ok to shower from Plastics standpoint if ok with primary team [name] on hold- discussed this with patient given diarrhea side effect of this medication, ok to resume this as outpatient. Electrolytes improved.  Culture with [facility] on gram stain- on Ancef. Empiric oral antibiotics already ordered for outpatient. Will f/u on cultures. Reviewed intra-op findings.  Patient has discharge orders and antibiotic send to pharmacy from Plastic standpoint if able to discharge. If discharges today, I will resend Rx to open pharmacy.   Glenna Fellows, MD [facility]  Office/ physician access line after hours [phone]

## 2024-03-08 NOTE — Care Management Obs Status (Signed)
 MEDICARE OBSERVATION STATUS NOTIFICATION   Patient Details  Name: Ashley Robertson MRN: [id number] Date of Birth: 06-14-1956   Medicare Observation Status Notification Given:  Yes    Isaias Cowman, RN 03/08/2024, 10:35 AM

## 2024-03-09 ENCOUNTER — Other Ambulatory Visit (HOSPITAL_COMMUNITY): Payer: Self-pay

## 2024-03-09 ENCOUNTER — Telehealth: Payer: Self-pay | Admitting: Internal Medicine

## 2024-03-09 NOTE — Telephone Encounter (Signed)
 Spoke with Ashley Robertson, she went into atrial fib in the past after having a procedure as well. She was restarted on eliquis yesterday. Previously when she had a fib, amiodarone was started, they did not restart that at discharge. She reports she is feeling fine with no chest pain, SOB or palpitations. She reports in the hospital her heart rate was in the 90's. She has not checked it at home. Follow up scheduled with dr branch tomorrow morning. Patient voiced understanding to call if her heart rate or symptoms change.

## 2024-03-09 NOTE — Telephone Encounter (Signed)
 Patient c/o Palpitations: STAT if patient c/o lightheadedness, shortness of breath, or chest pain  How long have you had palpitations/irregular HR/ Afib? Are you having the symptoms now? Since her procedure on 03/07/24.  Are you currently experiencing lightheadedness, SOB or CP? No  Do you have a history of afib (atrial fibrillation) or irregular heart rhythm? Yes, Feb 24th after another procedure she had (it was her first afib experience)  Have you checked your BP or HR? (document readings if available): Hr is in the 90's  Are you experiencing any other symptoms? No  Patient stated she had a procedure on 03/07/24 due to an infection in the right breast. Patient stated her HR was in the 90's while at the hospital and was taking an aspirin while in the hospital. Patient stated she was prescribed Eliquis and started taking it yesterday at 10:30 before leaving the hospital. Please advise.

## 2024-03-09 NOTE — Progress Notes (Unsigned)
 Cardiology Office Note:    Date:  03/10/2024   ID:  Ashley Robertson, DOB [birth year], MRN [id number]  PCP:  Gweneth Dimitri, MD   [facility] Providers Cardiologist:  Bryan Lemma, MD     Referring MD: Gweneth Dimitri, MD   No chief complaint on file. Cardio-Oncology Clinic- on cardiotoxic cancer therapy  History of Present Illness:    Ashley Robertson is a 68 y.o. female with a hx of HER2+, ER+ R breast cancer diagnosed 01/2022, on Her2 therapy since 03/2022 s/p mastemctomy, XRT 09/11/2022-10/24/2022, HTN, referral to cardio-onc clinic from Dr. Mosetta Putt. She is doing well on her2. Her blood pressure has been elevated. Her last echo read as EF decline 50-55, looks closer to 55-60.  Her GLS was normal. I recommended started carvedilol for cardioprotection. Recommended to continue her2 therapy.   She was following Dr. Herbie Baltimore for elevated CAC < 100. LDL goal <70. She saw Juanda Crumble. Dicussed infrarenal AA 5.4 x5.3 cm with a large burden of eccentric mural thrombus in April 2023, repeat shows infrarenal AA up to 5.7 cm, she is followed by vascular sx, she is planned for open repair.  No cardiac disease hx. She denies chest pain or SOB. No orthopnea/PND. No LE edema  She has grandkids, she is active with them.   Father had hypertension. Mother is healthy. Sister with hypertension. Brother had an MI while undergoing surgery for UC  The 10-year ASCVD risk score (Arnett DK, et al., 2019) is: 5.4%   Values used to calculate the score:     Age: 62 years     Sex: Female     Is Non-Hispanic African American: No     Diabetic: No     Tobacco smoker: No     Systolic Blood Pressure: 100 mmHg     Is BP treated: Yes     HDL Cholesterol: 87 mg/dL     Total Cholesterol: 181 mg/dL   EKG 4/0/9811- NSR  Interim hx 02/13/2023 She underwent AAA repair. She developed postop afib. She was started on eliquis. She converted to sinus with amiodarone. She was seen by Dr. Flora Lipps  She denies  angina, dyspnea on exertion, lower extremity edema, PND or orthopnea. No LH  Interim hx 07/03/2023 She is taking care of her grand childiren. No palpitations. No chest pain or SOB. Her therapy has gone well.   Interim 02/05/2024 She denies Cp, SOB, or palpitations  Interim 03/10/2024 Ashley Robertson was in the hospital and she comes in today after a hospitalization.  She had a right chest implant that was complicated by erythema and drainage.  She underwent removal of the implant.  During the hospital stay she was noted to have gone into atrial fibrillation.  She was placed on Eliquis.  She does have history of A-fib , she only had 1 episode postop so we decided to hold off on anticoagulation especially with low platelets.  Today, she is in sinus rhythm. Past Medical History:  Diagnosis Date   AAA (abdominal aortic aneurysm) (HCC) 01/11/2023   Allergy    Lisinopril and lipitor   Anxiety and depression    Aortic atherosclerosis (HCC) 03/2020   CT Chest: 2 V (LAD & LCx) Coronary Atherosclerosis, Aortic Atherosclerosis (no aneurysm).  Mild centrilobular emphysema with mild diffuse bronchial thickening; several scattered small solitary pulmonary nodules (largest 5.6 mm in anterior left upper lobe)   Breast cancer (HCC) 12/2021   Right breast ILC   Carotid artery plaque, bilateral 10/2015  Mild to moderate plaque L>R without significant stenosis   COPD (chronic obstructive pulmonary disease) (HCC)    Coronary Artery Calcification - Score 79    Coronary Calcium Score 79.  LAD and circumflex calcification noted.  Normal ascending aorta with mild calcification.   Current every day smoker    pt quit smoking April 2023   Dysrhythmia 01/2023   developed post op a-fib after AAA repair, converted with amiodarone   Emphysema lung (HCC)    Noted on chest CT   Family history of breast cancer 02/21/2022   Family history of prostate cancer 02/21/2022   Hyperlipidemia    Hypertension    Controlled with  amlodipine   Prediabetes    Skin cancer 2021   Remove 2022    Past Surgical History:  Procedure Laterality Date   ABDOMINAL AORTIC ANEURYSM REPAIR N/A 01/11/2023   Procedure: ANEURYSM ABDOMINAL AORTIC REPAIR;  Surgeon: Nada Libman, MD;  Location: [facility] OR;  Service: Vascular;  Laterality: N/A;   BREAST BIOPSY Right    times 3   BREAST RECONSTRUCTION WITH PLACEMENT OF TISSUE EXPANDER AND ALLODERM Right 07/26/2022   Procedure: RIGHT BREAST RECONSTRUCTION WITH PLACEMENT OF TISSUE EXPANDER AND ALLODERM;  Surgeon: Glenna Fellows, MD;  Location: [facility] OR;  Service: Plastics;  Laterality: Right;   CESAREAN SECTION  1990, 1991   COSMETIC SURGERY  1981   Rhinoplasty   MASTECTOMY Right    MASTECTOMY W/ SENTINEL NODE BIOPSY Right 07/26/2022   Procedure: RIGHT MASTECTOMY, RIGHT AXILLARY SENTINEL NODE BIOPSY;  Surgeon: Emelia Loron, MD;  Location: [facility] OR;  Service: General;  Laterality: Right;  GEN & PEC BLOCK   MASTOPEXY Left 12/20/2023   Procedure: MASTOPEXY;  Surgeon: Glenna Fellows, MD;  Location: [facility];  Service: Plastics;  Laterality: Left;   PORT-A-CATH REMOVAL Left 12/20/2023   Procedure: REMOVAL PORT-A-CATH;  Surgeon: Glenna Fellows, MD;  Location: [facility];  Service: Plastics;  Laterality: Left;   PORTACATH PLACEMENT Left 03/06/2022   Procedure: INSERTION PORT-A-CATH;  Surgeon: Emelia Loron, MD;  Location: [facility];  Service: General;  Laterality: Left;   RADIOACTIVE SEED GUIDED AXILLARY SENTINEL LYMPH NODE Right 07/26/2022   Procedure: RADIOACTIVE SEED GUIDED AXILLARY SENTINEL LYMPH NODE DISSECTION;  Surgeon: Emelia Loron, MD;  Location: [facility];  Service: General;  Laterality: Right;   REMOVAL OF TISSUE EXPANDER AND PLACEMENT OF IMPLANT Right 12/20/2023   Procedure: REMOVAL OF TISSUE EXPANDER AND PLACEMENT OF SILICONE IMPLANT;  Surgeon: Glenna Fellows, MD;  Location: [facility];  Service: Plastics;   Laterality: Right;   REMOVAL OF TISSUE EXPANDER AND PLACEMENT OF IMPLANT Right 03/07/2024   Procedure: REMOVAL RIGHT CHEST IMPLANT;  Surgeon: Glenna Fellows, MD;  Location: [facility];  Service: Plastics;  Laterality: Right;  REMOVAL OF IMPLANT   RHINOPLASTY  1981   TONSILLECTOMY  1978   TRANSTHORACIC ECHOCARDIOGRAM  10/2016   EF 55 to 60%.  Normal systolic and diastolic function.  No ASD/PFO   TUBAL LIGATION  1991    Current Medications: Current Outpatient Medications on File Prior to Visit  Medication Sig Dispense Refill   albuterol (PROVENTIL HFA) 108 (90 Base) MCG/ACT inhaler Inhale 1 to 2 puffs into the lungs every 6 hours as needed 8.5 g 2   alendronate (FOSAMAX) 70 MG tablet Take 1 tablet (70 mg total) by mouth once a week. Take with plain water 30 minutes before the first food, beverage, or medicine of the day. 12 tablet 0  amLODipine (NORVASC) 10 MG tablet Take 1 tablet (10 mg total) by mouth at bedtime. 90 tablet 3   anastrozole (ARIMIDEX) 1 MG tablet Take 1 tablet (1 mg) by mouth daily. 90 tablet 3   apixaban (ELIQUIS) 5 MG TABS tablet Take 1 tablet (5 mg total) by mouth 2 (two) times daily. 180 tablet 3   Calcium Carb-Cholecalciferol (CALCIUM 500 + D3 PO) Take 1 tablet by mouth daily.     carvedilol (COREG) 6.25 MG tablet Take 1 tablet (6.25 mg total) by mouth 2 (two) times daily. 180 tablet 3   Cholecalciferol (VITAMIN D) 50 MCG (2000 UT) CAPS Take 1 capsule by mouth daily. 30 capsule 2   ezetimibe (ZETIA) 10 MG tablet Take 1 tablet (10 mg total) by mouth daily. 90 tablet 2   FLUoxetine (PROZAC) 20 MG capsule Take 1 capsule (20 mg total) by mouth daily. 90 capsule 3   Fluticasone-Umeclidin-Vilant (TRELEGY ELLIPTA) 200-62.5-25 MCG/ACT AEPB Inhale 1 puff into the lungs daily. 60 each 2   loratadine (CLARITIN) 10 MG tablet Take 10 mg by mouth daily as needed for allergies.     methocarbamol (ROBAXIN) 500 MG tablet Take 1 tablet (500 mg total) by mouth 3 (three) times daily as needed  for muscle spasms. 5 tablet 0   [Paused] [name] Maleate (NERLYNX) 40 MG tablet Take 6 tablets (240 mg total) by mouth daily. Take with food. 180 tablet 4   prenatal vitamin w/FE, FA (PRENATAL 1 + 1) 27-1 MG TABS tablet Take 1 tablet by mouth daily at 12 noon.     rosuvastatin (CRESTOR) 5 MG tablet Take 1 tablet by mouth daily at bedtime ,except on Monday, Wednesday and Fridays take 2 tablets at bedtime as directed 180 tablet 0   sulfamethoxazole-trimethoprim (BACTRIM DS) 800-160 MG tablet Take 1 tablet by mouth 2 (two) times daily. 14 tablet 0   tacrolimus (PROTOPIC) 0.1 % ointment Apply to affected area 1-2 times daily x2-4 weeks then as needed for flares 60 g 2   Specialty Vitamins Products (MAGNESIUM, AMINO ACID CHELATE,) 133 MG tablet Take 1 tablet by mouth at bedtime. (Patient not taking: Reported on 03/10/2024)     [DISCONTINUED] hydrochlorothiazide (HYDRODIURIL) 25 MG tablet Take 1 tablet (25 mg total) by mouth daily. (Patient not taking: Reported on 02/05/2024) 30 tablet 6   [DISCONTINUED] lisinopril (ZESTRIL) 5 MG tablet Take 1 tablet (5 mg total) by mouth daily. (Patient not taking: Reported on 02/05/2024) 30 tablet 1   No current facility-administered medications on file prior to visit.    Allergies:   Lisinopril and Lipitor [atorvastatin]   Social History   Socioeconomic History   Marital status: Married    Spouse name: Not on file   Number of children: 2   Years of education: Not on file   Highest education level: Bachelor's degree (e.g., BA, AB, BS)  Occupational History   Occupation: OR Academic librarian: [employer]    Comment: Retired  Tobacco Use   Smoking status: Former    Current packs/day: 0.00    Average packs/day: 1 pack/day for 40.0 years (40.0 ttl pk-yrs)    Types: Cigarettes    Start date: 04/01/1982    Quit date: 04/01/2022    Years since quitting: 1.9   Smokeless tobacco: Never  Vaping Use   Vaping status: Former   Devices: zero nicotine vape  Substance  and Sexual Activity   Alcohol use: Yes    Alcohol/week: 6.0 standard drinks of alcohol  Types: 6 Glasses of wine per week    Comment: social 4-6 glasses wine weekly per pt   Drug use: No   Sexual activity: Not Currently    Birth control/protection: Post-menopausal, Surgical    Comment: BTL  Other Topics Concern   Not on file  Social History Narrative   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  She used to work at [employer].     Has 2 daughters.   Quit smoking 04/01/22.   Drinks 4 to 6 cups of coffee a day.      Currently trying Intermittent Fasting diet for weight loss.  Does not routinely exercise.   Social Drivers of Corporate investment banker Strain: Low Risk  (02/21/2022)   Overall Financial Resource Strain (CARDIA)    Difficulty of Paying Living Expenses: Not hard at all  Food Insecurity: No Food Insecurity (03/07/2024)   Hunger Vital Sign    Worried About Running Out of Food in the Last Year: Never true    Ran Out of Food in the Last Year: Never true  Transportation Needs: No Transportation Needs (03/07/2024)   PRAPARE - Administrator, Civil Service (Medical): No    Lack of Transportation (Non-Medical): No  Physical Activity: Not on file  Stress: Not on file  Social Connections: Moderately Isolated (03/07/2024)   Social Connection and Isolation Panel [NHANES]    Frequency of Communication with Friends and Family: Three times a week    Frequency of Social Gatherings with Friends and Family: Three times a week    Attends Religious Services: Never    Active Member of Clubs or Organizations: No    Attends Banker Meetings: Never    Marital Status: Married     Family History: The patient's family history includes Breast cancer in her niece and paternal aunt; Cancer - Other in her father; Healthy in her brother, brother, and sister; Hyperlipidemia in her father; Hypertension in her  father and sister; Liver cancer in her maternal grandfather; Lung cancer in her brother and maternal grandmother; Parkinson's disease in her father; Prostate cancer in her father; Skin cancer in her maternal aunt; Transient ischemic attack (age of onset: 20) in her mother.  ROS:   Please see the history of present illness.     All other systems reviewed and are negative.  EKGs/Labs/Other Studies Reviewed:    The following studies were reviewed today:  TTE: 10/29/2016- EF 55-60%, no valve disease 03/05/2022-EF 55-60%, GLS attempted 07/04/2022- EF nl, GLS -14.7% 09/19/2022- EF nl 12/19/2022-  EF estimated 50-55, challenging windows, looks closer to 55-60. GLS -19% normal  03/24/2020- coronary CTA- CAC 79 , 80th percentile, LAD and Lcx CAC  EKG:     Recent Labs: 03/07/2024: TSH 0.574 03/08/2024: ALT 15; BUN 15; Creatinine, Ser 0.73; Hemoglobin 12.5; Magnesium 2.3; Platelets 153; Potassium 4.8; Sodium 142   Recent Lipid Panel    Component Value Date/Time   CHOL 181 01/02/2023 0830   TRIG 85 01/02/2023 0830   HDL 87 01/02/2023 0830   CHOLHDL 2.1 01/02/2023 0830   LDLCALC 79 01/02/2023 0830     Risk Assessment/Calculations:     CHA2DS2-VASc Score = 3   This indicates a 3.2% annual risk of stroke. The patient's score is based upon: CHF History: 0 HTN History: 1 Diabetes History: 0 Stroke History: 0 Vascular Disease History: 0 Age Score: 1 Gender Score: 1  Physical Exam:    VS:  Vitals:   03/10/24 0847  BP: 100/60  Pulse: 70  SpO2: 92%     Wt Readings from Last 3 Encounters:  03/10/24 143 lb (64.9 kg)  03/07/24 141 lb 6.4 oz (64.1 kg)  02/11/24 145 lb 4.8 oz (65.9 kg)     GEN:  Well nourished, well developed in no acute distress HEENT: Normal NECK: No JVD; No carotid bruits LYMPHATICS: No lymphadenopathy CARDIAC: RRR, + SEM RESPIRATORY:  Clear to auscultation without rales, wheezing or rhonchi  ABDOMEN: Soft, non-tender, non-distended. Well healed  incision MUSCULOSKELETAL:  No edema; No deformity  SKIN: Warm and dry NEUROLOGIC:  Alert and oriented x 3 PSYCHIATRIC:  Normal affect   ASSESSMENT:   Cardio Oncology: on her 2 since 03/2022-planned until September, 2024 (switched therapy in September 2023; was on pertuzumab and trastuzumab without great response to therapy; changed to Kadcyla in September). Started cardioprotective agent for HTN, coreg in January 2024. Now on neratinib. GLS is normal. EF is preserved.  - She can continue her2 therapy.  - Baseline BNP was 61 - her LVEF and strain have been normal; last echo 11/18/2023 - she is planned for neratinib (HER2/TKI) until August of 2025  Post-Afib: occurred again.  In sinus rhythm - 14 day ziopatch  -continue eliquis 5 mg BID - s/p amiodarone  Thrombocytopenia - stable. Plt goal >50  Elevated CAC:  - continue asa 81 mg daily -continue crestor 5 mg (does 10 mg some days).  - cont zetia 10 mg daily - LDL near goal, she would like to avoid increased statin or PCSK9  Mild AS stable  HTN: well controlled. Continue coreg 6.25 mg BID and norvasc 10 mg daily. BP goal <130/80 mmHg  Infrarenal AA: up to 5.7 cm 12/21/2022; followed by vascular sx. She is s/p open repair 01/11/2023.  She is now s/p open AAA repair 2/2 juctarenal 5.7cm AAA on 2.9.2024.  Smoking: stopped smoking breast cancer diagnosis  PLAN:    In order of problems listed above:   Echo already scheduled, FU GLS in August Continue eliquis FU  with Dr. Elwyn Lade - Cardio-Oncology Clinic      Medication Adjustments/Labs and Tests Ordered: Current medicines are reviewed at length with the patient today.  Concerns regarding medicines are outlined above.  Orders Placed This Encounter  Procedures   LONG TERM MONITOR (3-14 DAYS)   EKG 12-Lead   No orders of the defined types were placed in this encounter.   Patient Instructions  Medication Instructions:  Your physician recommends that you continue on your  current medications as directed. Please refer to the Current Medication list given to you today.  *If you need a refill on your cardiac medications before your next appointment, please call your pharmacy*  Testing/Procedures: ZIO AT Long term monitor-Live Telemetry  Your physician has requested you wear a ZIO patch monitor for 14 days.  This is a single patch monitor. [employer] supplies one patch monitor per enrollment. Additional  stickers are not available.  Please do not apply patch if you will be having a Nuclear Stress Test, Echocardiogram, Cardiac CT, MRI,  or Chest Xray during the period you would be wearing the monitor. The patch cannot be worn during  these tests. You cannot remove and re-apply the ZIO AT patch monitor.  Your ZIO patch monitor will be mailed 3 day USPS to your address on file. It may take 3-5 days to  receive your monitor after you have been  enrolled.  Once you have received your monitor, please review the enclosed instructions. Your monitor has  already been registered assigning a specific monitor serial # to you.   Billing and Patient Assistance Program information  Ashley Robertson has been supplied with any insurance information on record for billing. [employer] offers a sliding scale Patient Assistance Program for patients without insurance, or whose  insurance does not completely cover the cost of the ZIO patch monitor. You must apply for the  Patient Assistance Program to qualify for the discounted rate. To apply, call [employer] at [phone],  select option 4, select option 2 , ask to apply for the Patient Assistance Program, (you can request an  interpreter if needed). [employer] will ask your household income and how many people are in your  household. [employer] will quote your out-of-pocket cost based on this information. They will also be able  to set up a 12 month interest free payment plan if needed.  Applying the monitor   Shave hair from upper left chest.  Hold  the abrader disc by orange tab. Rub the abrader in 40 strokes over left upper chest as indicated in  your monitor instructions.  Clean area with 4 enclosed alcohol pads. Use all pads to ensure the area is cleaned thoroughly. Let  dry.  Apply patch as indicated in monitor instructions. Patch will be placed under collarbone on left side of  chest with arrow pointing upward.  Rub patch adhesive wings for 2 minutes. Remove the white label marked "1". Remove the white label  marked "2". Rub patch adhesive wings for 2 additional minutes.  While looking in a mirror, press and release button in center of patch. A small green light will flash 3-4  times. This will be your only indicator that the monitor has been turned on.  Do not shower for the first 24 hours. You may shower after the first 24 hours.  Press the button if you feel a symptom. You will hear a small click. Record Date, Time and Symptom in  the Patient Log.   Starting the Gateway  In your kit there is a Audiological scientist box the size of a cellphone. This is Buyer, retail. It transmits all your  recorded data to [facility]. This box must always stay within 10 feet of you. Open the box and push the *  button. There will be a light that blinks orange and then green a few times. When the light stops  blinking, the Gateway is connected to the ZIO patch. Call [employer] at [phone] to confirm your monitor is transmitting.  Returning your monitor  Remove your patch and place it inside the Gateway. In the lower half of the Gateway there is a white  bag with prepaid postage on it. Place Gateway in bag and seal. Mail package back to [facility] as soon as  possible. Your physician should have your final report approximately 7 days after you have mailed back  your monitor. Call [facility] Customer Care at [phone] if you have questions regarding your ZIO AT  patch monitor. Call them immediately if you see an orange light blinking on your  monitor.  If your monitor falls off in less than 4 days, contact our Monitor department at [phone]. If your  monitor becomes loose or falls off after 4 days call [employer] at [phone] for suggestions on  securing your monitor   Follow-Up: At [facility], you and your health needs are our priority.  As part  of our continuing mission to provide you with exceptional heart care, our providers are all part of one team.  This team includes your primary Cardiologist (physician) and Advanced Practice Providers or APPs (Physician Assistants and Nurse Practitioners) who all work together to provide you with the care you need, when you need it.  Your next appointment:   4 month(s)  Provider:   Clearnce Hasten MD  Other Instructions    1st Floor: - Lobby - Registration  - Pharmacy  - Lab - Cafe  2nd Floor: - PV Lab - Diagnostic Testing (echo, CT, nuclear med)  3rd Floor: - Vacant  4th Floor: - TCTS (cardiothoracic surgery) - AFib Clinic - Structural Heart Clinic - Vascular Surgery  - Vascular Ultrasound  5th Floor: - HeartCare Cardiology (general and EP) - Clinical Pharmacy for coumadin, hypertension, lipid, weight-loss medications, and med management appointments    Valet parking services will be available as well.      Signed, Maisie Fus, MD  03/10/2024 9:05 AM    [facility]

## 2024-03-10 ENCOUNTER — Ambulatory Visit (INDEPENDENT_AMBULATORY_CARE_PROVIDER_SITE_OTHER)

## 2024-03-10 ENCOUNTER — Ambulatory Visit: Attending: Internal Medicine | Admitting: Internal Medicine

## 2024-03-10 ENCOUNTER — Encounter: Payer: Self-pay | Admitting: Internal Medicine

## 2024-03-10 VITALS — BP 100/60 | HR 70 | Ht 69.0 in | Wt 143.0 lb

## 2024-03-10 DIAGNOSIS — I350 Nonrheumatic aortic (valve) stenosis: Secondary | ICD-10-CM

## 2024-03-10 DIAGNOSIS — I2510 Atherosclerotic heart disease of native coronary artery without angina pectoris: Secondary | ICD-10-CM | POA: Diagnosis not present

## 2024-03-10 DIAGNOSIS — I700 Atherosclerosis of aorta: Secondary | ICD-10-CM

## 2024-03-10 DIAGNOSIS — I10 Essential (primary) hypertension: Secondary | ICD-10-CM

## 2024-03-10 DIAGNOSIS — I2584 Coronary atherosclerosis due to calcified coronary lesion: Secondary | ICD-10-CM

## 2024-03-10 DIAGNOSIS — I4891 Unspecified atrial fibrillation: Secondary | ICD-10-CM | POA: Diagnosis not present

## 2024-03-10 DIAGNOSIS — E7800 Pure hypercholesterolemia, unspecified: Secondary | ICD-10-CM

## 2024-03-10 NOTE — Progress Notes (Unsigned)
 Enrolled for [employer] to mail a ZIO XT long term holter monitor to the patients address on file.

## 2024-03-10 NOTE — Patient Instructions (Signed)
 Medication Instructions:  Your physician recommends that you continue on your current medications as directed. Please refer to the Current Medication list given to you today.  *If you need a refill on your cardiac medications before your next appointment, please call your pharmacy*  Testing/Procedures: ZIO AT Long term monitor-Live Telemetry  Your physician has requested you wear a ZIO patch monitor for 14 days.  This is a single patch monitor. [employer] supplies one patch monitor per enrollment. Additional  stickers are not available.  Please do not apply patch if you will be having a Nuclear Stress Test, Echocardiogram, Cardiac CT, MRI,  or Chest Xray during the period you would be wearing the monitor. The patch cannot be worn during  these tests. You cannot remove and re-apply the ZIO AT patch monitor.  Your ZIO patch monitor will be mailed 3 day USPS to your address on file. It may take 3-5 days to  receive your monitor after you have been enrolled.  Once you have received your monitor, please review the enclosed instructions. Your monitor has  already been registered assigning a specific monitor serial # to you.   Billing and Patient Assistance Program information  Meredeth Ide has been supplied with any insurance information on record for billing. [employer] offers a sliding scale Patient Assistance Program for patients without insurance, or whose  insurance does not completely cover the cost of the ZIO patch monitor. You must apply for the  Patient Assistance Program to qualify for the discounted rate. To apply, call [employer] at [phone],  select option 4, select option 2 , ask to apply for the Patient Assistance Program, (you can request an  interpreter if needed). [employer] will ask your household income and how many people are in your  household. [employer] will quote your out-of-pocket cost based on this information. They will also be able  to set up a 12 month interest free payment plan  if needed.  Applying the monitor   Shave hair from upper left chest.  Hold the abrader disc by orange tab. Rub the abrader in 40 strokes over left upper chest as indicated in  your monitor instructions.  Clean area with 4 enclosed alcohol pads. Use all pads to ensure the area is cleaned thoroughly. Let  dry.  Apply patch as indicated in monitor instructions. Patch will be placed under collarbone on left side of  chest with arrow pointing upward.  Rub patch adhesive wings for 2 minutes. Remove the white label marked "1". Remove the white label  marked "2". Rub patch adhesive wings for 2 additional minutes.  While looking in a mirror, press and release button in center of patch. A small green light will flash 3-4  times. This will be your only indicator that the monitor has been turned on.  Do not shower for the first 24 hours. You may shower after the first 24 hours.  Press the button if you feel a symptom. You will hear a small click. Record Date, Time and Symptom in  the Patient Log.   Starting the Gateway  In your kit there is a Audiological scientist box the size of a cellphone. This is Buyer, retail. It transmits all your  recorded data to [facility]. This box must always stay within 10 feet of you. Open the box and push the *  button. There will be a light that blinks orange and then green a few times. When the light stops  blinking, the Gateway is connected to the ZIO  patch. Call [employer] at [phone] to confirm your monitor is transmitting.  Returning your monitor  Remove your patch and place it inside the Gateway. In the lower half of the Gateway there is a white  bag with prepaid postage on it. Place Gateway in bag and seal. Mail package back to [place] as soon as  possible. Your physician should have your final report approximately 7 days after you have mailed back  your monitor. Call [facility] Customer Care at [phone] if you have questions regarding your ZIO AT   patch monitor. Call them immediately if you see an orange light blinking on your monitor.  If your monitor falls off in less than 4 days, contact our Monitor department at [phone]. If your  monitor becomes loose or falls off after 4 days call [employer] at [phone] for suggestions on  securing your monitor   Follow-Up: At [facility], you and your health needs are our priority.  As part of our continuing mission to provide you with exceptional heart care, our providers are all part of one team.  This team includes your primary Cardiologist (physician) and Advanced Practice Providers or APPs (Physician Assistants and Nurse Practitioners) who all work together to provide you with the care you need, when you need it.  Your next appointment:   4 month(s)  Provider:   Clearnce Hasten MD  Other Instructions    1st Floor: - Lobby - Registration  - Pharmacy  - Lab - Cafe  2nd Floor: - PV Lab - Diagnostic Testing (echo, CT, nuclear med)  3rd Floor: - Vacant  4th Floor: - TCTS (cardiothoracic surgery) - AFib Clinic - Structural Heart Clinic - Vascular Surgery  - Vascular Ultrasound  5th Floor: - HeartCare Cardiology (general and EP) - Clinical Pharmacy for coumadin, hypertension, lipid, weight-loss medications, and med management appointments    Valet parking services will be available as well.

## 2024-03-12 ENCOUNTER — Other Ambulatory Visit: Payer: Self-pay

## 2024-03-12 ENCOUNTER — Inpatient Hospital Stay (HOSPITAL_COMMUNITY): Admitting: Anesthesiology

## 2024-03-12 ENCOUNTER — Inpatient Hospital Stay (HOSPITAL_COMMUNITY)
Admission: AD | Admit: 2024-03-12 | Discharge: 2024-03-16 | DRG: 856 | Disposition: A | Discharge status: Home Health | Source: Ambulatory Visit | Attending: Physician Assistant | Admitting: Physician Assistant

## 2024-03-12 ENCOUNTER — Encounter (HOSPITAL_COMMUNITY): Payer: Self-pay

## 2024-03-12 ENCOUNTER — Encounter (HOSPITAL_COMMUNITY): Payer: Self-pay | Admitting: Plastic Surgery

## 2024-03-12 ENCOUNTER — Inpatient Hospital Stay (HOSPITAL_COMMUNITY)

## 2024-03-12 ENCOUNTER — Encounter (HOSPITAL_COMMUNITY): Admission: AD | Disposition: A | Discharge status: Home Health | Payer: Self-pay | Source: Ambulatory Visit

## 2024-03-12 DIAGNOSIS — N61 Mastitis without abscess: Secondary | ICD-10-CM | POA: Diagnosis not present

## 2024-03-12 DIAGNOSIS — I2510 Atherosclerotic heart disease of native coronary artery without angina pectoris: Secondary | ICD-10-CM | POA: Diagnosis present

## 2024-03-12 DIAGNOSIS — Z79899 Other long term (current) drug therapy: Secondary | ICD-10-CM

## 2024-03-12 DIAGNOSIS — T8141XA Infection following a procedure, superficial incisional surgical site, initial encounter: Principal | ICD-10-CM | POA: Diagnosis present

## 2024-03-12 DIAGNOSIS — I6529 Occlusion and stenosis of unspecified carotid artery: Secondary | ICD-10-CM | POA: Diagnosis present

## 2024-03-12 DIAGNOSIS — F3340 Major depressive disorder, recurrent, in remission, unspecified: Secondary | ICD-10-CM | POA: Diagnosis not present

## 2024-03-12 DIAGNOSIS — C50811 Malignant neoplasm of overlapping sites of right female breast: Secondary | ICD-10-CM | POA: Diagnosis present

## 2024-03-12 DIAGNOSIS — Z923 Personal history of irradiation: Secondary | ICD-10-CM

## 2024-03-12 DIAGNOSIS — I4891 Unspecified atrial fibrillation: Secondary | ICD-10-CM

## 2024-03-12 DIAGNOSIS — L98499 Non-pressure chronic ulcer of skin of other sites with unspecified severity: Secondary | ICD-10-CM | POA: Diagnosis not present

## 2024-03-12 DIAGNOSIS — T8579XA Infection and inflammatory reaction due to other internal prosthetic devices, implants and grafts, initial encounter: Secondary | ICD-10-CM

## 2024-03-12 DIAGNOSIS — A4901 Methicillin susceptible Staphylococcus aureus infection, unspecified site: Secondary | ICD-10-CM

## 2024-03-12 DIAGNOSIS — Y834 Other reconstructive surgery as the cause of abnormal reaction of the patient, or of later complication, without mention of misadventure at the time of the procedure: Secondary | ICD-10-CM | POA: Diagnosis present

## 2024-03-12 DIAGNOSIS — Z79811 Long term (current) use of aromatase inhibitors: Secondary | ICD-10-CM

## 2024-03-12 DIAGNOSIS — M7989 Other specified soft tissue disorders: Secondary | ICD-10-CM | POA: Diagnosis not present

## 2024-03-12 DIAGNOSIS — J432 Centrilobular emphysema: Secondary | ICD-10-CM | POA: Diagnosis not present

## 2024-03-12 DIAGNOSIS — M726 Necrotizing fasciitis: Secondary | ICD-10-CM | POA: Diagnosis present

## 2024-03-12 DIAGNOSIS — Z170 Estrogen receptor positive status [ER+]: Secondary | ICD-10-CM

## 2024-03-12 DIAGNOSIS — Z853 Personal history of malignant neoplasm of breast: Secondary | ICD-10-CM

## 2024-03-12 DIAGNOSIS — R000 Tachycardia, unspecified: Secondary | ICD-10-CM | POA: Diagnosis present

## 2024-03-12 DIAGNOSIS — I4819 Other persistent atrial fibrillation: Secondary | ICD-10-CM | POA: Diagnosis not present

## 2024-03-12 DIAGNOSIS — Z83438 Family history of other disorder of lipoprotein metabolism and other lipidemia: Secondary | ICD-10-CM

## 2024-03-12 DIAGNOSIS — Z1731 Human epidermal growth factor receptor 2 positive status: Secondary | ICD-10-CM

## 2024-03-12 DIAGNOSIS — T8131XA Disruption of external operation (surgical) wound, not elsewhere classified, initial encounter: Secondary | ICD-10-CM | POA: Diagnosis not present

## 2024-03-12 DIAGNOSIS — F419 Anxiety disorder, unspecified: Secondary | ICD-10-CM | POA: Diagnosis present

## 2024-03-12 DIAGNOSIS — Z87891 Personal history of nicotine dependence: Secondary | ICD-10-CM

## 2024-03-12 DIAGNOSIS — I480 Paroxysmal atrial fibrillation: Secondary | ICD-10-CM | POA: Diagnosis not present

## 2024-03-12 DIAGNOSIS — E785 Hyperlipidemia, unspecified: Secondary | ICD-10-CM | POA: Diagnosis not present

## 2024-03-12 DIAGNOSIS — Z9011 Acquired absence of right breast and nipple: Secondary | ICD-10-CM

## 2024-03-12 DIAGNOSIS — T8579XD Infection and inflammatory reaction due to other internal prosthetic devices, implants and grafts, subsequent encounter: Secondary | ICD-10-CM | POA: Diagnosis not present

## 2024-03-12 DIAGNOSIS — B9561 Methicillin susceptible Staphylococcus aureus infection as the cause of diseases classified elsewhere: Secondary | ICD-10-CM | POA: Diagnosis present

## 2024-03-12 DIAGNOSIS — Z85828 Personal history of other malignant neoplasm of skin: Secondary | ICD-10-CM

## 2024-03-12 DIAGNOSIS — I96 Gangrene, not elsewhere classified: Secondary | ICD-10-CM | POA: Diagnosis not present

## 2024-03-12 DIAGNOSIS — C773 Secondary and unspecified malignant neoplasm of axilla and upper limb lymph nodes: Secondary | ICD-10-CM | POA: Diagnosis not present

## 2024-03-12 DIAGNOSIS — I2584 Coronary atherosclerosis due to calcified coronary lesion: Secondary | ICD-10-CM | POA: Diagnosis not present

## 2024-03-12 DIAGNOSIS — R7303 Prediabetes: Secondary | ICD-10-CM | POA: Diagnosis present

## 2024-03-12 DIAGNOSIS — L98498 Non-pressure chronic ulcer of skin of other sites with other specified severity: Secondary | ICD-10-CM | POA: Diagnosis present

## 2024-03-12 DIAGNOSIS — I10 Essential (primary) hypertension: Secondary | ICD-10-CM | POA: Diagnosis not present

## 2024-03-12 DIAGNOSIS — Z800 Family history of malignant neoplasm of digestive organs: Secondary | ICD-10-CM

## 2024-03-12 DIAGNOSIS — Z808 Family history of malignant neoplasm of other organs or systems: Secondary | ICD-10-CM

## 2024-03-12 DIAGNOSIS — I700 Atherosclerosis of aorta: Secondary | ICD-10-CM | POA: Diagnosis not present

## 2024-03-12 DIAGNOSIS — Z7901 Long term (current) use of anticoagulants: Secondary | ICD-10-CM

## 2024-03-12 DIAGNOSIS — L1235 Other acquired epidermolysis bullosa: Secondary | ICD-10-CM | POA: Diagnosis present

## 2024-03-12 DIAGNOSIS — Z9889 Other specified postprocedural states: Secondary | ICD-10-CM | POA: Diagnosis not present

## 2024-03-12 DIAGNOSIS — L02213 Cutaneous abscess of chest wall: Secondary | ICD-10-CM | POA: Diagnosis present

## 2024-03-12 DIAGNOSIS — Z801 Family history of malignant neoplasm of trachea, bronchus and lung: Secondary | ICD-10-CM

## 2024-03-12 DIAGNOSIS — L089 Local infection of the skin and subcutaneous tissue, unspecified: Secondary | ICD-10-CM | POA: Diagnosis present

## 2024-03-12 DIAGNOSIS — L03313 Cellulitis of chest wall: Secondary | ICD-10-CM | POA: Diagnosis not present

## 2024-03-12 DIAGNOSIS — C799 Secondary malignant neoplasm of unspecified site: Secondary | ICD-10-CM | POA: Diagnosis not present

## 2024-03-12 DIAGNOSIS — E876 Hypokalemia: Secondary | ICD-10-CM | POA: Diagnosis present

## 2024-03-12 DIAGNOSIS — Z8042 Family history of malignant neoplasm of prostate: Secondary | ICD-10-CM

## 2024-03-12 DIAGNOSIS — Z8679 Personal history of other diseases of the circulatory system: Secondary | ICD-10-CM

## 2024-03-12 DIAGNOSIS — Z9221 Personal history of antineoplastic chemotherapy: Secondary | ICD-10-CM | POA: Diagnosis not present

## 2024-03-12 DIAGNOSIS — S21109A Unspecified open wound of unspecified front wall of thorax without penetration into thoracic cavity, initial encounter: Secondary | ICD-10-CM | POA: Diagnosis not present

## 2024-03-12 DIAGNOSIS — Z820 Family history of epilepsy and other diseases of the nervous system: Secondary | ICD-10-CM

## 2024-03-12 DIAGNOSIS — Z8249 Family history of ischemic heart disease and other diseases of the circulatory system: Secondary | ICD-10-CM

## 2024-03-12 DIAGNOSIS — Z1722 Progesterone receptor negative status: Secondary | ICD-10-CM

## 2024-03-12 DIAGNOSIS — Z888 Allergy status to other drugs, medicaments and biological substances status: Secondary | ICD-10-CM

## 2024-03-12 DIAGNOSIS — Z803 Family history of malignant neoplasm of breast: Secondary | ICD-10-CM

## 2024-03-12 HISTORY — PX: INCISION AND DRAINAGE OF WOUND: SHX1803

## 2024-03-12 LAB — COMPREHENSIVE METABOLIC PANEL WITH GFR
ALT: 15 U/L (ref 0–44)
AST: 21 U/L (ref 15–41)
Albumin: 2.2 g/dL — ABNORMAL LOW (ref 3.5–5.0)
Alkaline Phosphatase: 115 U/L (ref 38–126)
Anion gap: 14 (ref 5–15)
BUN: 9 mg/dL (ref 8–23)
CO2: 27 mmol/L (ref 22–32)
Calcium: 8.1 mg/dL — ABNORMAL LOW (ref 8.9–10.3)
Chloride: 92 mmol/L — ABNORMAL LOW (ref 98–111)
Creatinine, Ser: 0.61 mg/dL (ref 0.44–1.00)
GFR, Estimated: >60 mL/min (ref >60)
Glucose, Bld: 120 mg/dL — ABNORMAL HIGH (ref 70–99)
Potassium: 2.9 mmol/L — ABNORMAL LOW (ref 3.5–5.1)
Sodium: 133 mmol/L — ABNORMAL LOW (ref 135–145)
Total Bilirubin: 0.9 mg/dL (ref 0.0–1.2)
Total Protein: 5.9 g/dL — ABNORMAL LOW (ref 6.5–8.1)

## 2024-03-12 LAB — CBC
HCT: 33.9 % — ABNORMAL LOW (ref 36.0–46.0)
Hemoglobin: 11.7 g/dL — ABNORMAL LOW (ref 12.0–15.0)
MCH: 33.1 pg (ref 26.0–34.0)
MCHC: 34.5 g/dL (ref 30.0–36.0)
MCV: 95.8 fL (ref 80.0–100.0)
Platelets: 231 10*3/uL (ref 150–400)
RBC: 3.54 MIL/uL — ABNORMAL LOW (ref 3.87–5.11)
RDW: 13.2 % (ref 11.5–15.5)
WBC: 17.6 10*3/uL — ABNORMAL HIGH (ref 4.0–10.5)
nRBC: 0 % (ref 0.0–0.2)

## 2024-03-12 LAB — TYPE AND SCREEN
ABO/RH(D): A POS
Antibody Screen: NEGATIVE

## 2024-03-12 LAB — AEROBIC/ANAEROBIC CULTURE W GRAM STAIN (SURGICAL/DEEP WOUND)

## 2024-03-12 LAB — LACTIC ACID, PLASMA: Lactic Acid, Venous: 2.8 mmol/L (ref 0.5–1.9)

## 2024-03-12 SURGERY — IRRIGATION AND DEBRIDEMENT WOUND
Anesthesia: General | Laterality: Right

## 2024-03-12 MED ORDER — LACTATED RINGERS IV SOLN
INTRAVENOUS | Status: DC | PRN
Start: 2024-03-12 — End: 2024-03-12

## 2024-03-12 MED ORDER — PROPOFOL 10 MG/ML IV BOLUS
INTRAVENOUS | Status: AC
  Filled 2024-03-12: qty 20

## 2024-03-12 MED ORDER — AMIODARONE HCL 200 MG PO TABS: 400.0000 mg | ORAL_TABLET | Freq: Every day | ORAL | Status: DC

## 2024-03-12 MED ORDER — CHLORHEXIDINE GLUCONATE 0.12 % MT SOLN
15.0000 mL | Freq: Once | OROMUCOSAL | Status: AC
  Administered 2024-03-12: 15 mL via OROMUCOSAL
  Filled 2024-03-12: qty 15

## 2024-03-12 MED ORDER — ALBUTEROL SULFATE (2.5 MG/3ML) 0.083% IN NEBU: 2.5000 mg | INHALATION_SOLUTION | RESPIRATORY_TRACT | Status: DC | PRN

## 2024-03-12 MED ORDER — OXYCODONE HCL 5 MG PO TABS
5.0000 mg | ORAL_TABLET | ORAL | Status: DC | PRN
  Filled 2024-03-12: qty 1

## 2024-03-12 MED ORDER — AMLODIPINE BESYLATE 10 MG PO TABS
10.0000 mg | ORAL_TABLET | Freq: Every day | ORAL | Status: DC
Start: 2024-03-12 — End: 2024-03-12

## 2024-03-12 MED ORDER — ONDANSETRON HCL 4 MG/2ML IJ SOLN: 4.0000 mg | Freq: Four times a day (QID) | INTRAMUSCULAR | Status: DC | PRN

## 2024-03-12 MED ORDER — SODIUM CHLORIDE 0.9 % IV BOLUS
500.0000 mL | Freq: Once | INTRAVENOUS | Status: AC
  Administered 2024-03-12: 500 mL via INTRAVENOUS

## 2024-03-12 MED ORDER — AMIODARONE HCL 200 MG PO TABS: 400.0000 mg | ORAL_TABLET | Freq: Two times a day (BID) | ORAL | Status: DC

## 2024-03-12 MED ORDER — 0.9 % SODIUM CHLORIDE (POUR BTL) OPTIME
TOPICAL | Status: DC | PRN
  Administered 2024-03-12: 1000 mL

## 2024-03-12 MED ORDER — EMPTY CONTAINERS FLEXIBLE MISC: 900.0000 mg | Freq: Once | Status: DC

## 2024-03-12 MED ORDER — SODIUM CHLORIDE 0.9 % IV SOLN: INTRAVENOUS | Status: DC | PRN

## 2024-03-12 MED ORDER — SUCCINYLCHOLINE CHLORIDE 200 MG/10ML IV SOSY
PREFILLED_SYRINGE | INTRAVENOUS | Status: DC | PRN
  Administered 2024-03-12: 100 mg via INTRAVENOUS

## 2024-03-12 MED ORDER — LACTATED RINGERS IV SOLN: INTRAVENOUS | Status: DC

## 2024-03-12 MED ORDER — AMIODARONE HCL 200 MG PO TABS: 200.0000 mg | ORAL_TABLET | Freq: Two times a day (BID) | ORAL | Status: DC

## 2024-03-12 MED ORDER — ENOXAPARIN SODIUM 40 MG/0.4ML IJ SOSY
40.0000 mg | PREFILLED_SYRINGE | INTRAMUSCULAR | Status: DC
  Administered 2024-03-13: 40 mg via SUBCUTANEOUS
  Filled 2024-03-12: qty 0.4

## 2024-03-12 MED ORDER — PIPERACILLIN-TAZOBACTAM 3.375 G IVPB
3.3750 g | Freq: Three times a day (TID) | INTRAVENOUS | Status: DC
  Administered 2024-03-12 – 2024-03-16 (×11): 3.375 g via INTRAVENOUS
  Filled 2024-03-12 (×11): qty 50

## 2024-03-12 MED ORDER — LIDOCAINE 2% (20 MG/ML) 5 ML SYRINGE
INTRAMUSCULAR | Status: AC
  Filled 2024-03-12: qty 5

## 2024-03-12 MED ORDER — ONDANSETRON HCL 4 MG/2ML IJ SOLN: 4.0000 mg | Freq: Once | INTRAMUSCULAR | Status: DC | PRN

## 2024-03-12 MED ORDER — LACTATED RINGERS IV SOLN: INTRAVENOUS | Status: DC | PRN

## 2024-03-12 MED ORDER — PHENYLEPHRINE 80 MCG/ML (10ML) SYRINGE FOR IV PUSH (FOR BLOOD PRESSURE SUPPORT)
PREFILLED_SYRINGE | INTRAVENOUS | Status: AC
  Filled 2024-03-12: qty 10

## 2024-03-12 MED ORDER — TACROLIMUS 0.1 % EX OINT: TOPICAL_OINTMENT | Freq: Two times a day (BID) | CUTANEOUS | Status: DC

## 2024-03-12 MED ORDER — ORAL CARE MOUTH RINSE: 15.0000 mL | Freq: Once | OROMUCOSAL | Status: AC

## 2024-03-12 MED ORDER — FENTANYL CITRATE (PF) 250 MCG/5ML IJ SOLN
INTRAMUSCULAR | Status: DC | PRN
  Administered 2024-03-12 (×3): 50 ug via INTRAVENOUS

## 2024-03-12 MED ORDER — EZETIMIBE 10 MG PO TABS
10.0000 mg | ORAL_TABLET | Freq: Every day | ORAL | Status: DC
  Administered 2024-03-13 – 2024-03-16 (×4): 10 mg via ORAL
  Filled 2024-03-12 (×5): qty 1

## 2024-03-12 MED ORDER — IOHEXOL 350 MG/ML SOLN
50.0000 mL | Freq: Once | INTRAVENOUS | Status: AC | PRN
  Administered 2024-03-12: 50 mL via INTRAVENOUS

## 2024-03-12 MED ORDER — SODIUM CHLORIDE 0.9 % IV SOLN: INTRAVENOUS | Status: AC

## 2024-03-12 MED ORDER — AMIODARONE HCL 200 MG PO TABS
200.0000 mg | ORAL_TABLET | Freq: Every day | ORAL | Status: DC
Start: 2024-03-21 — End: 2024-03-13

## 2024-03-12 MED ORDER — ROCURONIUM BROMIDE 10 MG/ML (PF) SYRINGE
PREFILLED_SYRINGE | INTRAVENOUS | Status: AC
  Filled 2024-03-12: qty 10

## 2024-03-12 MED ORDER — ONDANSETRON HCL 4 MG/2ML IJ SOLN
INTRAMUSCULAR | Status: DC | PRN
  Administered 2024-03-12: 4 mg via INTRAVENOUS

## 2024-03-12 MED ORDER — FENTANYL CITRATE (PF) 100 MCG/2ML IJ SOLN
INTRAMUSCULAR | Status: AC
  Filled 2024-03-12: qty 2

## 2024-03-12 MED ORDER — AMIODARONE HCL 200 MG PO TABS
200.0000 mg | ORAL_TABLET | Freq: Every day | ORAL | Status: DC
Start: 2024-03-20 — End: 2024-03-12

## 2024-03-12 MED ORDER — FENTANYL CITRATE (PF) 100 MCG/2ML IJ SOLN
25.0000 ug | INTRAMUSCULAR | Status: DC | PRN
  Administered 2024-03-12 (×2): 50 ug via INTRAVENOUS

## 2024-03-12 MED ORDER — AMISULPRIDE (ANTIEMETIC) 5 MG/2ML IV SOLN: 10.0000 mg | Freq: Once | INTRAVENOUS | Status: DC | PRN

## 2024-03-12 MED ORDER — SUCCINYLCHOLINE CHLORIDE 200 MG/10ML IV SOSY
PREFILLED_SYRINGE | INTRAVENOUS | Status: AC
  Filled 2024-03-12: qty 10

## 2024-03-12 MED ORDER — AMIODARONE HCL IN DEXTROSE 360-4.14 MG/200ML-% IV SOLN
60.0000 mg/h | INTRAVENOUS | Status: AC
  Administered 2024-03-12: 60 mg/h via INTRAVENOUS
  Filled 2024-03-12: qty 200

## 2024-03-12 MED ORDER — FLUOXETINE HCL 20 MG PO CAPS
20.0000 mg | ORAL_CAPSULE | Freq: Every day | ORAL | Status: DC
  Administered 2024-03-13 – 2024-03-16 (×4): 20 mg via ORAL
  Filled 2024-03-12 (×5): qty 1

## 2024-03-12 MED ORDER — SUGAMMADEX SODIUM 200 MG/2ML IV SOLN
INTRAVENOUS | Status: DC | PRN
  Administered 2024-03-12: 200 mg via INTRAVENOUS

## 2024-03-12 MED ORDER — ROCURONIUM BROMIDE 10 MG/ML (PF) SYRINGE
PREFILLED_SYRINGE | INTRAVENOUS | Status: DC | PRN
  Administered 2024-03-12: 30 mg via INTRAVENOUS

## 2024-03-12 MED ORDER — PHENYLEPHRINE HCL-NACL 20-0.9 MG/250ML-% IV SOLN
INTRAVENOUS | Status: DC | PRN
Start: 2024-03-12 — End: 2024-03-12
  Administered 2024-03-12: 50 ug/min via INTRAVENOUS

## 2024-03-12 MED ORDER — BUPIVACAINE-EPINEPHRINE (PF) 0.25% -1:200000 IJ SOLN
INTRAMUSCULAR | Status: AC
  Filled 2024-03-12: qty 30

## 2024-03-12 MED ORDER — DEXAMETHASONE SODIUM PHOSPHATE 10 MG/ML IJ SOLN
INTRAMUSCULAR | Status: DC | PRN
  Administered 2024-03-12: 10 mg via INTRAVENOUS

## 2024-03-12 MED ORDER — DEXAMETHASONE SODIUM PHOSPHATE 10 MG/ML IJ SOLN
INTRAMUSCULAR | Status: AC
  Filled 2024-03-12: qty 1

## 2024-03-12 MED ORDER — CEFAZOLIN SODIUM-DEXTROSE 2-4 GM/100ML-% IV SOLN: 2.0000 g | Freq: Three times a day (TID) | INTRAVENOUS | Status: DC

## 2024-03-12 MED ORDER — PROPOFOL 10 MG/ML IV BOLUS
INTRAVENOUS | Status: DC | PRN
  Administered 2024-03-12: 130 mg via INTRAVENOUS
  Administered 2024-03-12: 30 mg via INTRAVENOUS

## 2024-03-12 MED ORDER — ALBUTEROL SULFATE HFA 108 (90 BASE) MCG/ACT IN AERS
1.0000 | INHALATION_SPRAY | RESPIRATORY_TRACT | Status: DC | PRN
Start: 2024-03-12 — End: 2024-03-12

## 2024-03-12 MED ORDER — AMIODARONE HCL IN DEXTROSE 360-4.14 MG/200ML-% IV SOLN
30.0000 mg/h | INTRAVENOUS | Status: AC
  Administered 2024-03-13 (×3): 30 mg/h via INTRAVENOUS
  Filled 2024-03-12 (×4): qty 200

## 2024-03-12 MED ORDER — ROSUVASTATIN CALCIUM 5 MG PO TABS
5.0000 mg | ORAL_TABLET | ORAL | Status: DC
  Administered 2024-03-12 – 2024-03-15 (×3): 5 mg via ORAL
  Filled 2024-03-12 (×3): qty 1

## 2024-03-12 MED ORDER — PROTHROMBIN COMPLEX CONC HUMAN 500 UNITS IV KIT
2659.0000 [IU] | PACK | Status: AC
  Administered 2024-03-12: 2659 [IU] via INTRAVENOUS
  Filled 2024-03-12: qty 1611

## 2024-03-12 MED ORDER — ROSUVASTATIN CALCIUM 5 MG PO TABS: 5.0000 mg | ORAL_TABLET | Freq: Every day | ORAL | Status: DC

## 2024-03-12 MED ORDER — LIDOCAINE 2% (20 MG/ML) 5 ML SYRINGE
INTRAMUSCULAR | Status: DC | PRN
  Administered 2024-03-12: 80 mg via INTRAVENOUS

## 2024-03-12 MED ORDER — BUDESON-GLYCOPYRROL-FORMOTEROL 160-9-4.8 MCG/ACT IN AERO
2.0000 | INHALATION_SPRAY | Freq: Two times a day (BID) | RESPIRATORY_TRACT | Status: DC
  Administered 2024-03-13 – 2024-03-16 (×7): 2 via RESPIRATORY_TRACT
  Filled 2024-03-12 (×2): qty 5.9

## 2024-03-12 MED ORDER — METHOCARBAMOL 500 MG PO TABS: 500.0000 mg | ORAL_TABLET | Freq: Three times a day (TID) | ORAL | Status: DC | PRN

## 2024-03-12 MED ORDER — AMIODARONE LOAD VIA INFUSION
150.0000 mg | Freq: Once | INTRAVENOUS | Status: AC
  Administered 2024-03-12: 150 mg via INTRAVENOUS
  Filled 2024-03-12: qty 83.34

## 2024-03-12 MED ORDER — ONDANSETRON 4 MG PO TBDP: 4.0000 mg | ORAL_TABLET | Freq: Four times a day (QID) | ORAL | Status: DC | PRN

## 2024-03-12 MED ORDER — ROSUVASTATIN CALCIUM 5 MG PO TABS: 5.0000 mg | ORAL_TABLET | ORAL | Status: DC

## 2024-03-12 MED ORDER — PHENYLEPHRINE 80 MCG/ML (10ML) SYRINGE FOR IV PUSH (FOR BLOOD PRESSURE SUPPORT)
PREFILLED_SYRINGE | INTRAVENOUS | Status: DC | PRN
  Administered 2024-03-12: 120 ug via INTRAVENOUS

## 2024-03-12 MED ORDER — CARVEDILOL 6.25 MG PO TABS
6.2500 mg | ORAL_TABLET | Freq: Two times a day (BID) | ORAL | Status: DC
  Administered 2024-03-12 – 2024-03-16 (×8): 6.25 mg via ORAL
  Filled 2024-03-12 (×6): qty 1
  Filled 2024-03-12: qty 2
  Filled 2024-03-12 (×2): qty 1

## 2024-03-12 MED ORDER — POTASSIUM CHLORIDE 10 MEQ/100ML IV SOLN
10.0000 meq | INTRAVENOUS | Status: AC
Start: 2024-03-12 — End: 2024-03-13
  Administered 2024-03-12 (×4): 10 meq via INTRAVENOUS
  Filled 2024-03-12 (×4): qty 100

## 2024-03-12 MED ORDER — ROSUVASTATIN CALCIUM 5 MG PO TABS
10.0000 mg | ORAL_TABLET | ORAL | Status: DC
  Administered 2024-03-13: 10 mg via ORAL
  Filled 2024-03-12: qty 2

## 2024-03-12 MED ORDER — FENTANYL CITRATE (PF) 250 MCG/5ML IJ SOLN
INTRAMUSCULAR | Status: AC
  Filled 2024-03-12: qty 5

## 2024-03-12 MED ORDER — LINEZOLID 600 MG/300ML IV SOLN
600.0000 mg | Freq: Two times a day (BID) | INTRAVENOUS | Status: DC
  Administered 2024-03-12 – 2024-03-16 (×8): 600 mg via INTRAVENOUS
  Filled 2024-03-12 (×9): qty 300

## 2024-03-12 MED ORDER — ONDANSETRON HCL 4 MG/2ML IJ SOLN
INTRAMUSCULAR | Status: AC
  Filled 2024-03-12: qty 2

## 2024-03-12 SURGICAL SUPPLY — 25 items
BINDER BREAST LRG (GAUZE/BANDAGES/DRESSINGS) IMPLANT
CANISTER SUCT 3000ML PPV (MISCELLANEOUS) ×1 IMPLANT
CNTNR URN SCR LID CUP LEK RST (MISCELLANEOUS) IMPLANT
COVER SURGICAL LIGHT HANDLE (MISCELLANEOUS) ×1 IMPLANT
DRAPE LAPAROSCOPIC ABDOMINAL (DRAPES) IMPLANT
DRESSING QUICKCLOT CNTRL ZFOLD (GAUZE/BANDAGES/DRESSINGS) IMPLANT
DRSG QUICKCLOT CNTRL ZFOLD (GAUZE/BANDAGES/DRESSINGS) ×1 IMPLANT
ELECT CAUTERY BLADE 6.4 (BLADE) ×1 IMPLANT
ELECT REM PT RETURN 9FT ADLT (ELECTROSURGICAL) ×1 IMPLANT
ELECTRODE REM PT RTRN 9FT ADLT (ELECTROSURGICAL) ×1 IMPLANT
GAUZE PAD ABD 8X10 STRL (GAUZE/BANDAGES/DRESSINGS) IMPLANT
GLOVE BIO SURGEON STRL SZ7 (GLOVE) ×1 IMPLANT
GLOVE BIOGEL PI IND STRL 7.5 (GLOVE) ×1 IMPLANT
GOWN STRL REUS W/ TWL LRG LVL3 (GOWN DISPOSABLE) ×2 IMPLANT
KIT BASIN OR (CUSTOM PROCEDURE TRAY) ×1 IMPLANT
KIT TURNOVER KIT B (KITS) ×1 IMPLANT
NS IRRIG 1000ML POUR BTL (IV SOLUTION) ×1 IMPLANT
PACK GENERAL/GYN (CUSTOM PROCEDURE TRAY) ×1 IMPLANT
PAD ABD 8X10 STRL (GAUZE/BANDAGES/DRESSINGS) IMPLANT
PAD ARMBOARD POSITIONER FOAM (MISCELLANEOUS) ×1 IMPLANT
SWAB COLLECTION DEVICE MRSA (MISCELLANEOUS) IMPLANT
SWAB CULTURE ESWAB REG 1ML (MISCELLANEOUS) IMPLANT
TOWEL GREEN STERILE (TOWEL DISPOSABLE) ×1 IMPLANT
TOWEL GREEN STERILE FF (TOWEL DISPOSABLE) ×1 IMPLANT
TRAY FOLEY MTR SLVR 14FR STAT (SET/KITS/TRAYS/PACK) IMPLANT

## 2024-03-12 NOTE — Consult Note (Addendum)
 Initial Consultation Note   Patient: Ashley Robertson WJX:[id number] DOB: 05/07/1956 PCP: Gweneth Dimitri, MD DOA: 03/12/2024 DOS: the patient was seen and examined on 03/12/2024 Primary service: Glenna Fellows, MD  Referring physician: Glenna Fellows, MD Reason for consult: Assist with management of multiple medical problems  Chief Complaint: Worsening cellulitis  HPI: Ashley Robertson is a 68 y.o. female with medical history significant of hypertension, hyperlipidemia, atrial fibrillation, CAD, carotid artery disease, emphysema, AAA s/p repair, breast cancer status postmastectomy, radiation, chemo, depression presenting with worsening cellulitis.  Patient has history of breast cancer status post mastectomy and had reconstruction several months ago.  Was admitted 4/5-4/6 with evidence of infection/cellulitis at right implant with drainage and erythema and exposure of implant.  Implant was removed.  Cultures grew MSSA and patient was discharged on Bactrim. (That admission was complicated by episode of A-fib postoperatively, with a history of similar after her AAA repair in 2024.  Was discharged on Eliquis.)  Since returning home patient has now had at least 1 day of worsening symptoms of her right chest/breast.  She has had worsening erythema and now has had skin blistering and weeping.  Presented to her plastic surgeon office this morning with these complaints and was directly admitted.  She denies fevers, chills, shortness of breath, abdominal pain, constipation, diarrhea, nausea, vomiting.  Course: Awaiting initial labs.  Initial vital signs notable for tachycardia in the 110s.  CT chest is pending.  Infectious disease has been consulted and patient has been started on Ancef based on previous cultures.  Review of Systems: As per HPI otherwise all other systems reviewed and are negative.  Past Medical History:  Diagnosis Date   AAA (abdominal aortic aneurysm) (HCC) 01/11/2023   Allergy     Lisinopril and lipitor   Anxiety and depression    Aortic atherosclerosis (HCC) 03/2020   CT Chest: 2 V (LAD & LCx) Coronary Atherosclerosis, Aortic Atherosclerosis (no aneurysm).  Mild centrilobular emphysema with mild diffuse bronchial thickening; several scattered small solitary pulmonary nodules (largest 5.6 mm in anterior left upper lobe)   Breast cancer (HCC) 12/2021   Right breast ILC   Carotid artery plaque, bilateral 10/2015   Mild to moderate plaque L>R without significant stenosis   COPD (chronic obstructive pulmonary disease) (HCC)    Coronary Artery Calcification - Score 79    Coronary Calcium Score 79.  LAD and circumflex calcification noted.  Normal ascending aorta with mild calcification.   Current every day smoker    pt quit smoking April 2023   Dysrhythmia 01/2023   developed post op a-fib after AAA repair, converted with amiodarone   Emphysema lung (HCC)    Noted on chest CT   Family history of breast cancer 02/21/2022   Family history of prostate cancer 02/21/2022   Hyperlipidemia    Hypertension    Controlled with amlodipine   Prediabetes    Skin cancer 2021   Remove 2022    Past Surgical History:  Procedure Laterality Date   ABDOMINAL AORTIC ANEURYSM REPAIR N/A 01/11/2023   Procedure: ANEURYSM ABDOMINAL AORTIC REPAIR;  Surgeon: Nada Libman, MD;  Location: [facility] OR;  Service: Vascular;  Laterality: N/A;   BREAST BIOPSY Right    times 3   BREAST RECONSTRUCTION WITH PLACEMENT OF TISSUE EXPANDER AND ALLODERM Right 07/26/2022   Procedure: RIGHT BREAST RECONSTRUCTION WITH PLACEMENT OF TISSUE EXPANDER AND ALLODERM;  Surgeon: Glenna Fellows, MD;  Location: [facility] OR;  Service: Plastics;  Laterality: Right;   CESAREAN SECTION  1990, 1991   COSMETIC SURGERY  1981   Rhinoplasty   MASTECTOMY Right    MASTECTOMY W/ SENTINEL NODE BIOPSY Right 07/26/2022   Procedure: RIGHT MASTECTOMY, RIGHT AXILLARY SENTINEL NODE BIOPSY;  Surgeon: Emelia Loron, MD;  Location: [facility]  OR;  Service: General;  Laterality: Right;  GEN & PEC BLOCK   MASTOPEXY Left 12/20/2023   Procedure: MASTOPEXY;  Surgeon: Glenna Fellows, MD;  Location: [facility];  Service: Plastics;  Laterality: Left;   PORT-A-CATH REMOVAL Left 12/20/2023   Procedure: REMOVAL PORT-A-CATH;  Surgeon: Glenna Fellows, MD;  Location: [facility];  Service: Plastics;  Laterality: Left;   PORTACATH PLACEMENT Left 03/06/2022   Procedure: INSERTION PORT-A-CATH;  Surgeon: Emelia Loron, MD;  Location: [facility];  Service: General;  Laterality: Left;   RADIOACTIVE SEED GUIDED AXILLARY SENTINEL LYMPH NODE Right 07/26/2022   Procedure: RADIOACTIVE SEED GUIDED AXILLARY SENTINEL LYMPH NODE DISSECTION;  Surgeon: Emelia Loron, MD;  Location: [facility] OR;  Service: General;  Laterality: Right;   REMOVAL OF TISSUE EXPANDER AND PLACEMENT OF IMPLANT Right 12/20/2023   Procedure: REMOVAL OF TISSUE EXPANDER AND PLACEMENT OF SILICONE IMPLANT;  Surgeon: Glenna Fellows, MD;  Location:  SURGERY CENTER;  Service: Plastics;  Laterality: Right;   REMOVAL OF TISSUE EXPANDER AND PLACEMENT OF IMPLANT Right 03/07/2024   Procedure: REMOVAL RIGHT CHEST IMPLANT;  Surgeon: Glenna Fellows, MD;  Location: [facility] OR;  Service: Plastics;  Laterality: Right;  REMOVAL OF IMPLANT   RHINOPLASTY  1981   TONSILLECTOMY  1978   TRANSTHORACIC ECHOCARDIOGRAM  10/2016   EF 55 to 60%.  Normal systolic and diastolic function.  No ASD/PFO   TUBAL LIGATION  1991    Social History  reports that she quit smoking about 1 years ago. Her smoking use included cigarettes. She started smoking about 41 years ago. She has a 40 pack-year smoking history. She has never used smokeless tobacco. She reports current alcohol use of about 6.0 standard drinks of alcohol per week. She reports that she does not use drugs.  Allergies  Allergen Reactions   Lisinopril Other (See Comments) and Cough    High potassium    Lipitor [Atorvastatin] Other (See Comments)    Myalgias    Family History  Problem Relation Age of Onset   Transient ischemic attack Mother 15   Parkinson's disease Father    Hyperlipidemia Father    Hypertension Father    Cancer - Other Father        sarcoma--left shoulder   Prostate cancer Father        dx after 38   Healthy Sister    Hypertension Sister    Lung cancer Brother        dx 19s   Healthy Brother    Healthy Brother    Skin cancer Maternal Aunt    Breast cancer Paternal Aunt        dx after 54   Lung cancer Maternal Grandmother        dx 13s   Liver cancer Maternal Grandfather        dx 37s   Breast cancer Niece        dx 30s  Reviewed on admission  Prior to Admission medications   Medication Sig Start Date End Date Taking? Authorizing Provider  albuterol (PROVENTIL HFA) 108 (90 Base) MCG/ACT inhaler Inhale 1 to 2 puffs into the lungs every 6 hours as needed 01/25/22     alendronate (FOSAMAX) 70 MG tablet  Take 1 tablet (70 mg total) by mouth once a week. Take with plain water 30 minutes before the first food, beverage, or medicine of the day. 02/24/24     amLODipine (NORVASC) 10 MG tablet Take 1 tablet (10 mg total) by mouth at bedtime. 02/24/24   Maisie Fus, MD  anastrozole (ARIMIDEX) 1 MG tablet Take 1 tablet (1 mg) by mouth daily. 05/09/23   Malachy Mood, MD  apixaban (ELIQUIS) 5 MG TABS tablet Take 1 tablet (5 mg total) by mouth 2 (two) times daily. 03/08/24   Danford, Earl Lites, MD  Calcium Carb-Cholecalciferol (CALCIUM 500 + D3 PO) Take 1 tablet by mouth daily.    [provider]  carvedilol (COREG) 6.25 MG tablet Take 1 tablet (6.25 mg total) by mouth 2 (two) times daily. 09/30/23   Marykay Lex, MD  Cholecalciferol (VITAMIN D) 50 MCG (2000 UT) CAPS Take 1 capsule by mouth daily. 11/29/22   Malachy Mood, MD  ezetimibe (ZETIA) 10 MG tablet Take 1 tablet (10 mg total) by mouth daily. 01/23/24   Maisie Fus, MD  FLUoxetine (PROZAC) 20 MG capsule  Take 1 capsule (20 mg total) by mouth daily. 04/11/23     Fluticasone-Umeclidin-Vilant (TRELEGY ELLIPTA) 200-62.5-25 MCG/ACT AEPB Inhale 1 puff into the lungs daily. 12/27/23   Mannam, Colbert Coyer, MD  loratadine (CLARITIN) 10 MG tablet Take 10 mg by mouth daily as needed for allergies.    [provider]  methocarbamol (ROBAXIN) 500 MG tablet Take 1 tablet (500 mg total) by mouth 3 (three) times daily as needed for muscle spasms. 12/05/23     [name] Maleate (NERLYNX) 40 MG tablet Take 6 tablets (240 mg total) by mouth daily. Take with food. 02/11/24   Malachy Mood, MD  prenatal vitamin w/FE, FA (PRENATAL 1 + 1) 27-1 MG TABS tablet Take 1 tablet by mouth daily at 12 noon.    [provider]  rosuvastatin (CRESTOR) 5 MG tablet Take 1 tablet by mouth daily at bedtime ,except on Monday, Wednesday and Fridays take 2 tablets at bedtime as directed 11/21/23   Marykay Lex, MD  Specialty Vitamins Products (MAGNESIUM, AMINO ACID CHELATE,) 133 MG tablet Take 1 tablet by mouth at bedtime. Patient not taking: Reported on 03/10/2024    [provider]  tacrolimus (PROTOPIC) 0.1 % ointment Apply to affected area 1-2 times daily x2-4 weeks then as needed for flares 01/16/24     hydrochlorothiazide (HYDRODIURIL) 25 MG tablet Take 1 tablet (25 mg total) by mouth daily. Patient not taking: Reported on 02/05/2024 10/06/20 12/13/20  Marykay Lex, MD  lisinopril (ZESTRIL) 5 MG tablet Take 1 tablet (5 mg total) by mouth daily. Patient not taking: Reported on 02/05/2024 08/30/20 09/10/20  Marykay Lex, MD    Physical Exam: Vitals:   03/12/24 1521  BP: 112/66  Pulse: (!) 117  Resp: 16  Temp: 98.4 F (36.9 C)  SpO2: 98%    Physical Exam Constitutional:      General: She is not in acute distress.    Appearance: Normal appearance.  HENT:     Head: Normocephalic and atraumatic.     Mouth/Throat:     Mouth: Mucous membranes are moist.     Pharynx: Oropharynx is clear.  Eyes:      Extraocular Movements: Extraocular movements intact.     Pupils: Pupils are equal, round, and reactive to light.  Cardiovascular:     Rate and Rhythm: Normal rate and regular rhythm.  Pulses: Normal pulses.     Heart sounds: Normal heart sounds.  Pulmonary:     Effort: Pulmonary effort is normal. No respiratory distress.     Breath sounds: Normal breath sounds.  Abdominal:     General: Bowel sounds are normal. There is no distension.     Palpations: Abdomen is soft.     Tenderness: There is no abdominal tenderness.  Musculoskeletal:        General: No swelling or deformity.  Skin:    Comments: Left chest wall erythema, blistering, drainage.  Does cross the midline and spreading to the left chest now.  Neurological:     General: No focal deficit present.     Mental Status: Mental status is at baseline.    Labs on Admission: I have personally reviewed following labs and imaging studies  CBC: Recent Labs  Lab 03/07/24 0858 03/08/24 0417  WBC 8.9 4.8  NEUTROABS  --  3.8  HGB 13.1 12.5  HCT 38.8 36.2  MCV 98.5 96.8  PLT 154 153    Basic Metabolic Panel: Recent Labs  Lab 03/07/24 0858 03/07/24 1444 03/08/24 0417  NA 135  132* 134* 142  K 2.7*  2.6* 3.4* 4.8  CL 100  95* 96* 102  CO2 26  22 26 30   GLUCOSE 118*  122* 145* 166*  BUN 16  14 12 15   CREATININE 0.62  0.73 0.77 0.73  CALCIUM 9.0  9.0 8.8* 8.5*  MG 2.1  --  2.3  PHOS  --   --  2.9    GFR: Estimated Creatinine Clearance: 69 mL/min (by C-G formula based on SCr of 0.73 mg/dL).  Liver Function Tests: Recent Labs  Lab 03/07/24 1444 03/08/24 0417  AST 19 18  ALT 18 15  ALKPHOS 107 99  BILITOT 0.7 0.6  PROT 6.8 6.3*  ALBUMIN 2.8* 2.5*    Urine analysis:    Component Value Date/Time   COLORURINE STRAW (A) 01/04/2023 0952   APPEARANCEUR CLEAR 01/04/2023 0952   LABSPEC 1.004 (L) 01/04/2023 0952   PHURINE 6.0 01/04/2023 0952   GLUCOSEU NEGATIVE 01/04/2023 0952   HGBUR NEGATIVE  01/04/2023 0952   BILIRUBINUR NEGATIVE 01/04/2023 0952   KETONESUR NEGATIVE 01/04/2023 0952   PROTEINUR NEGATIVE 01/04/2023 0952   NITRITE NEGATIVE 01/04/2023 0952   LEUKOCYTESUR NEGATIVE 01/04/2023 0952    Radiological Exams on Admission: No results found.  EKG: Not yet performed this admission  Assessment/Plan Principal Problem:   Cellulitis of chest wall Active Problems:   Atrial fibrillation (HCC)   Essential hypertension   Hyperlipidemia with target LDL less than 100   Malignant neoplasm of overlapping sites of right breast in female, estrogen receptor positive (HCC)   Carotid atherosclerosis   Coronary artery disease   Recurrent major depression in remission (HCC)   S/P AAA repair   Cellulitis ?Necrotizing infection > Patient presenting with worsening cellulitis of her chest wall. > This is in the setting of reconstructive surgery 3 months ago inpatient status post mastectomy for breast cancer as below. > Admitted earlier this month with erythema and drainage of the right implant with implant exposure.  Implant subsequently removed. > Cultures from that procedure grew out MSSA and patient was discharged on doxycycline. > Now with worsening cellulitis of that right chest with development of blisters and weeping in the soft tissue.  Some spread to the left chest but right chest is primarily involved. > Patient directly admitted by plastic surgery and infectious disease consulted.  Patient started on Ancef based on previous culture results.  Patient's general surgery team also consulted. - Patient on telemetry - Infectious disease and general surgery recommendations and assistance - Agree with ID antibiotic recommendations - Follow-up CT chest - IV fluids - Trend fever curve and WBC - Supportive care  Breast cancer > Initially diagnosed in 2023. > Is status post mastectomy, radiation, chemotherapy > Current treatment includes anastrozole and neratinib - Anastrozole  and neratinib currently held  Hypertension - Continue home amlodipine and carvedilol  Atrial fibrillation > Some tachycardia on admission, will check EKG. - Continue home carvedilol - Holding Eliquis in case surgical intervention is required  CAD Carotid artery disease - Continue home carvedilol, Zetia, statin - Eliquis held  Hyperlipidemia - Continue home Zetia, statin  COPD/emphysema - Home Trelegy has been replaced with formulary Breztri - Can have as needed albuterol  Depression - Continue home fluoxetine  AAA status post repair - Noted  Thank you for this consult, Hospitalist service will continue to follow.  Synetta Fail MD [facility]  How to contact the [facility] Attending or Consulting provider 7A - 7P or covering provider during after hours 7P -7A, for this patient?   Check the care team in [facility] and look for a) attending/consulting [facility] provider listed and b) the [facility] team listed Log into [email or web address] and use [place]'s universal password to access. If you do not have the password, please contact the hospital operator. Locate the [facility] provider you are looking for under [facility] and page to a number that you can be directly reached. If you still have difficulty reaching the provider, please page the [facility] (Director on Call) for the Hospitalists listed on amion for assistance.  03/12/2024, 3:24 PM

## 2024-03-12 NOTE — Op Note (Signed)
 Preoperative diagnosis: Necrotizing soft tissue infection of the right chest wall Postoperative diagnosis: Same as above Procedure: Debridement of 13 x 14 x 1 cm area of the right chest wall including skin and subcutaneous tissue Surgical Dr. Harden Mo Anesthesia: General Complications: None Drains: None Specimens: Cultures to microbiology, tissue for culture Sponge needle count correct at completion Disposition to recovery stable condition  Indications: This is a 68 year old female who was 3 months status post a implant exchange on the right breast as part of staged breast reconstruction.  5 days ago she had removal of her right breast implant following exposure of the implant.  She then today she had increased drainage as well as redness.  She was admitted to the hospital by Dr. Leta Baptist  She was seen and identified to appear to have what was a necrotizing soft tissue infection we discussed proceeding to the operating room.  Procedure: After informed consent was obtained she was taken to the operating room.  She had already received antibiotics.  SCDs were in place.  She had a Foley catheter placed.  She was placed under general anesthesia without complication.  She was prepped and draped in standard sterile surgical fashion.  Surgical timeout was then performed.  I used the cautery to excise what ended up being a 13 x 14 x 1 cm area of dead chest wall.  There was purulence present that I cultured.  I debrided this back to all normal tissue.  There were no more tissue planes that moved upon completion.  We had given her Kcentra due to her Eliquis and there was some bleeding but this was controlled with cautery.  I eventually irrigated this.  I then placed a quick clot Z fold sponge in the wound and then placed ABDs and a breast binder over this.  She tolerated this well and will be transferred to recovery stable condition.  I have consulted cardiology and she will be on a progressive floor  following this.

## 2024-03-12 NOTE — Progress Notes (Signed)
 Doran Durand from lab called with lactic acid critical value of 2.8. Notified Dr. Glenna Fellows at [phone].

## 2024-03-12 NOTE — Anesthesia Postprocedure Evaluation (Signed)
 Anesthesia Post Note  Patient: CERINITY ZYNDA  Procedure(s) Performed: IRRIGATION AND DEBRIDEMENT WOUND (Right)     Patient location during evaluation: PACU Anesthesia Type: General Level of consciousness: awake and alert Pain management: pain level controlled Vital Signs Assessment: post-procedure vital signs reviewed and stable Respiratory status: spontaneous breathing, nonlabored ventilation and respiratory function stable Cardiovascular status: blood pressure returned to baseline and stable Postop Assessment: no apparent nausea or vomiting Anesthetic complications: no   There were no known notable events for this encounter.  Last Vitals:  Vitals:   03/12/24 2030 03/12/24 2053  BP: (!) 95/57 93/63  Pulse: (!) 124 (!) 118  Resp: 16 (!) 26  Temp: 37.3 C 37.1 C  SpO2: 93% 90%    Last Pain:  Vitals:   03/12/24 2030  TempSrc:   PainSc: 2                  Collene Schlichter

## 2024-03-12 NOTE — Progress Notes (Addendum)
 Plastic Surgery  Patient has returned from CT. Read pending but no evidence undrained abscess. WBC and lactic acid elevated. Patient and I discussed in office I am concerned that she has developed necrotizing soft tissue infection. I have consulted Drs. Regis Bill for evaluation. Dr. Dwain Sarna at bedside and recommends surgery. Plan OR now.   At time of this note, IV antibiotic doses are waiting to be administered as well as bolus IVF ordered. Discussed with staff.  Patient understands I will follow her long term for wound care but will defer acute management to surgery and medical team.  Glenna Fellows, MD [facility] Plastic [facility] Reconstructive Surgery  Office/ physician access line after hours [phone]

## 2024-03-12 NOTE — Anesthesia Procedure Notes (Signed)
 Procedure Name: Intubation Date/Time: 03/12/2024 7:03 PM  Performed by: Flonnie Hailstone, CRNAPre-anesthesia Checklist: Patient identified, Emergency Drugs available, Suction available and Patient being monitored Patient Re-evaluated:Patient Re-evaluated prior to induction Oxygen Delivery Method: Circle system utilized Preoxygenation: Pre-oxygenation with 100% oxygen Induction Type: IV induction, Rapid sequence and Cricoid Pressure applied Laryngoscope Size: Mac and 3 Grade View: Grade I Tube type: Oral Tube size: 7.5 mm Number of attempts: 1 Airway Equipment and Method: Stylet Placement Confirmation: ETT inserted through vocal cords under direct vision, positive ETCO2 and breath sounds checked- equal and bilateral Secured at: 23 cm Tube secured with: Tape Dental Injury: Teeth and Oropharynx as per pre-operative assessment  Comments: intubated by Samul Dada, CRNA; ebbs

## 2024-03-12 NOTE — Progress Notes (Addendum)
 Pharmacy Antibiotic Note  Ashley Robertson is a 68 y.o. female admitted on 03/12/2024 with breast cellulitis/wound infection - initial concerns for necrotizing fasciitis. Pharmacy has been consulted for Linezolid + Zosyn dosing.  Plan: - Start Zosyn 3.375g IV every 8 hours - Start Linezolid 600 mg IV every 12 hours - Will continue to follow renal function, culture results, LOT, and antibiotic de-escalation plans      Temp (24hrs), Avg:98.4 F (36.9 C), Min:98.4 F (36.9 C), Max:98.4 F (36.9 C)  Recent Labs  Lab 03/07/24 0858 03/07/24 1444 03/08/24 0417  WBC 8.9  --  4.8  CREATININE 0.62  0.73 0.77 0.73    Estimated Creatinine Clearance: 69 mL/min (by C-G formula based on SCr of 0.73 mg/dL).    Allergies  Allergen Reactions   Zestril [Lisinopril] Other (See Comments) and Cough    Hyperkalemia    Lipitor [Atorvastatin] Other (See Comments)    Myalgias    Thank you for allowing pharmacy to be a part of this patient's care.  Georgina Pillion, PharmD, BCPS, BCIDP Infectious Diseases Clinical Pharmacist 03/12/2024 3:59 PM   **Pharmacist phone directory can now be found on [email or web address] ([email or web address]).  Listed under [facility].

## 2024-03-12 NOTE — Progress Notes (Signed)
 Pt transported to surgery

## 2024-03-12 NOTE — Consult Note (Signed)
 [facility]    Date of Admission:  03/12/2024     Reason for Consult: cellulitis/abscess    Referring Provider: Glenna Fellows     Lines:  Right breast surgical site drain  Abx: 4/10-c cefazolin        Assessment: 68 yo female hx afib on eliquis, right breast cancer s/p neoadjuvant chemo and 3 months prior to admission left breast reduction and right breast implant exchange which was removed 4/5 for clear discharge, however, admitted for worsening cellulitis right breast  There is a drain placed 4/5 into surgical site She was given bactrim after surgery, however the rash had quickly worsened and there are echymoses/necrotic changes concerning for nec fasc  She clinically looks ok otherwise but the rash is very concerning    Plan: Cbc/cmp Ct chest looking for deeper space infection Dr Leta Baptist had spoken with general surgery about concern as well for nec fasc and who will see patient Will extend abx for mssa to more broad/with toxin inhibition --> linezolid and piptazo Blood culture Standard isolation precaution Discussed with dr Leta Baptist      ------------------------------------------------ Active Problems:   * No active hospital problems. *    HPI: Ashley Robertson is a 68 y.o. female female hx breast cancer s/p neoadjuvant chemo and 3 months prior to admission left breast reduction and right breast implant exchange which was removed 4/5 for clear discharge, however, admitted for worsening cellulitis right breast   Discussed history with patient, dr Leta Baptist, and chart reviewed   Patient had clear discharge of the right breast leading to 4/5 breast implant removed. Culture grew mssa and she was placed on bactrim. However the rash on f/u today with the [place] looks significantly worse so patient was directly admitted   Subjective chill at home, malaise, diffuse myalgia   No indwelling catheter in body. Port for  chemo had been removed long ago. No focal joint swelling/pain including midline back   On other complaint   She does have a surgical site drain that is draining serosanguinous fluid no frank pus   She does take eliquis for afib    Family History  Problem Relation Age of Onset   Transient ischemic attack Mother 38   Parkinson's disease Father    Hyperlipidemia Father    Hypertension Father    Cancer - Other Father        sarcoma--left shoulder   Prostate cancer Father        dx after 23   Healthy Sister    Hypertension Sister    Lung cancer Brother        dx 39s   Healthy Brother    Healthy Brother    Skin cancer Maternal Aunt    Breast cancer Paternal Aunt        dx after 52   Lung cancer Maternal Grandmother        dx 25s   Liver cancer Maternal Grandfather        dx 57s   Breast cancer Niece        dx 30s    Social History   Tobacco Use   Smoking status: Former    Current packs/day: 0.00    Average packs/day: 1 pack/day for 40.0 years (40.0 ttl pk-yrs)    Types: Cigarettes    Start date: 04/01/1982    Quit date: 04/01/2022    Years since quitting: 1.9   Smokeless tobacco:  Never  Vaping Use   Vaping status: Former   Devices: zero nicotine vape  Substance Use Topics   Alcohol use: Yes    Alcohol/week: 6.0 standard drinks of alcohol    Types: 6 Glasses of wine per week    Comment: social 4-6 glasses wine weekly per pt   Drug use: No    Allergies  Allergen Reactions   Lisinopril Other (See Comments) and Cough    High potassium   Lipitor [Atorvastatin] Other (See Comments)    Myalgias    Review of Systems: ROS All Other ROS was negative, except mentioned above   Past Medical History:  Diagnosis Date   AAA (abdominal aortic aneurysm) (HCC) 01/11/2023   Allergy    Lisinopril and lipitor   Anxiety and depression    Aortic atherosclerosis (HCC) 03/2020   CT Chest: 2 V (LAD & LCx) Coronary Atherosclerosis, Aortic Atherosclerosis (no aneurysm).   Mild centrilobular emphysema with mild diffuse bronchial thickening; several scattered small solitary pulmonary nodules (largest 5.6 mm in anterior left upper lobe)   Breast cancer (HCC) 12/2021   Right breast ILC   Carotid artery plaque, bilateral 10/2015   Mild to moderate plaque L>R without significant stenosis   COPD (chronic obstructive pulmonary disease) (HCC)    Coronary Artery Calcification - Score 79    Coronary Calcium Score 79.  LAD and circumflex calcification noted.  Normal ascending aorta with mild calcification.   Current every day smoker    pt quit smoking April 2023   Dysrhythmia 01/2023   developed post op a-fib after AAA repair, converted with amiodarone   Emphysema lung (HCC)    Noted on chest CT   Family history of breast cancer 02/21/2022   Family history of prostate cancer 02/21/2022   Hyperlipidemia    Hypertension    Controlled with amlodipine   Prediabetes    Skin cancer 2021   Remove 2022       Scheduled Meds: Continuous Infusions: PRN Meds:.   OBJECTIVE: Blood pressure 112/66, pulse (!) 117, temperature 98.4 F (36.9 C), resp. rate 16, SpO2 98%.  Physical Exam General/constitutional: no distress, pleasant HEENT: Normocephalic, PER, Conj Clear, EOMI, Oropharynx clear Neck supple CV: rrr no mrg Lungs: clear to auscultation, normal respiratory effort Abd: Soft, Nontender Ext: no edema Skin: see picture. Concerning echymoses/blisters; no fluctuance; drain present    Neuro: nonfocal MSK: no peripheral joint swelling/tenderness/warmth; back spines nontender       Lab Results Lab Results  Component Value Date   WBC 4.8 03/08/2024   HGB 12.5 03/08/2024   HCT 36.2 03/08/2024   MCV 96.8 03/08/2024   PLT 153 03/08/2024    Lab Results  Component Value Date   CREATININE 0.73 03/08/2024   BUN 15 03/08/2024   NA 142 03/08/2024   K 4.8 03/08/2024   CL 102 03/08/2024   CO2 30 03/08/2024    Lab Results  Component Value Date   ALT  15 03/08/2024   AST 18 03/08/2024   ALKPHOS 99 03/08/2024   BILITOT 0.6 03/08/2024      Microbiology: Recent Results (from the past 240 hours)  Aerobic/Anaerobic Culture w Gram Stain (surgical/deep wound)     Status: None   Collection Time: 03/07/24 11:35 AM   Specimen: Wound; Abscess  Result Value Ref Range Status   Specimen Description WOUND  Final   Special Requests RIGHT CHEST WALL  Final   Gram Stain   Final    MODERATE WBC PRESENT, PREDOMINANTLY  PMN MODERATE GRAM POSITIVE COCCI    Culture   Final    FEW STAPHYLOCOCCUS AUREUS NO ANAEROBES ISOLATED Performed at [facility], [address], [place], [place] [address]    Report Status 03/12/2024 FINAL  Final   Organism ID, Bacteria STAPHYLOCOCCUS AUREUS  Final      Susceptibility   Staphylococcus aureus - MIC*    CIPROFLOXACIN <=0.5 SENSITIVE Sensitive     ERYTHROMYCIN <=0.25 SENSITIVE Sensitive     GENTAMICIN <=0.5 SENSITIVE Sensitive     OXACILLIN 0.5 SENSITIVE Sensitive     TETRACYCLINE >=16 RESISTANT Resistant     VANCOMYCIN 1 SENSITIVE Sensitive     TRIMETH/SULFA <=10 SENSITIVE Sensitive     CLINDAMYCIN <=0.25 SENSITIVE Sensitive     RIFAMPIN <=0.5 SENSITIVE Sensitive     Inducible Clindamycin NEGATIVE Sensitive     LINEZOLID 2 SENSITIVE Sensitive     * FEW STAPHYLOCOCCUS AUREUS     Serology:    Imaging: If present, new imagings (plain films, ct scans, and mri) have been personally visualized and interpreted; radiology reports have been reviewed. Decision making incorporated into the Impression / Recommendations.    Raymondo Band, MD [facility] [phone] pager    03/12/2024, 3:07 PM

## 2024-03-12 NOTE — H&P (Addendum)
 Subjective Patient ID: Ashley Robertson is a 68 y.o. female.     HPI   3 months post op implant exchange and left breast reduction as part of staged breast reconstruction. Now 5 days post removal right breast implant following development drainage and exposure implant. Culture S aureus, on Bactrim. Post operatively patient developed atrial fibrillation. She is now on Eliquis. Seen by Dr. Wyline Mood 2 days ago and noted to be in sinus rhythm. Zio patch ordered and eliquis continued. [name] on hold. Patient returns today for scheduled post op and reports chest "exploded" over night with drainage from skin. She has noted increased redness chest during this time. Denies fever. Drain log 30 ml, then 18 ml over 24 hr period for last 2 days.    Presented with palpable right breast mass. MMG/US showed mass right breast 11 o'clock, 6 cmfn measuring 2.1 x 3.3 x 2.0 cm.  A 6 o clock mass present, 3 cmfn measuring 1.2 x 1.1 x 2.6 cm. No definitive abnormal axillary LN noted, however, a single LN with 3.1 mm cortex present.   Biopsies labeled right breast 6 o clock and 11o clock both with ILC, ER+/PR-, Her2+. Biopsy of LN with metastatic disease.   MRI demonstrated 8.5 x 8.3 x 5.9 cm area of mass-like enhancement in the right breast involving all 4 quadrants and containing 2 biopsy marker clip artifacts. Biopsy-proven metastatic LN in the right axilla. Possible metastatic IM LN noted.   Staging scans negative for distant disease. Completed neoadjuvant chemotherapy. Completed adjuvant radiation 11.22.2023. [place] 05/14/2022 with similar size mass noted. Completed [name] 06/2023   Final pathology 9.5 cm ILC 2/5 LN+. On [name] adjuvant through Sep 2024. On Anastrozole and neratinib.   Genetics with VUS in BRIP1   Staging scans noted 5 cm AAA . Vascular Surgery recommended repair . Had open AAA repair.    Prior 84 G with Soma bra, reported that she did not feel really that large. Happy with pre mastectomy size. Right  mastectomy 685 g Left breast mastopexy 74 g.    MMG left 12/2023   Reports quit smoking 3.30.23. Patient is a retired Landscape architect. Her daughter is an Charity fundraiser for [employer] now, float pool for ICU.   PMH significant for HTN, HLD, pre DM, COPD.   Patient is a full code.   Review of Systems   Objective Physical Exam  Cardiovascular: Regular rhythm and normal heart sounds.  tachycardic    Pulmonary/Chest Effort normal and breath sounds normal.    Psychiatric She has a normal mood and affect.    Chest: right chest incision intact Cellulitis has spread from last exam 4 days ago onto chest and medial left breast Ulcerations and epidermolysis chest wall Drain remians on suction watery serosanguinous, removed partially skin sutures Inframammary fold and no significant drainage encountered.     Assessment/Plan Right breast cancer overlapping sites ER+ metastatic to LN Neoadjuvant chemotherapy S/p right SRM, ALND, prepectoral TE/ADM (Alloderm) reconstruction Adjuvant RT S/p removal port, right silicone implant exchange, left mastopexy Drainage, exposure right implant S/p removal    Progression of cellulitis while on oral antibiotics. Epidermolysis and ulceration skin that is new. Plan admission to hospital. Plan IV antibiotics, consider imaging chest CT. Plan ID consultation and may need medical team evaluation given recent atrial fibrillation. Bed placement called [facility] for direct admission. Punch biopsy skin flap completed today.    Patient asensate in area. Prepped with CHG. 3 mm punch biopsy twice completed. Biopsy site approximated with  5-0 monocryl.  Tissue very friable. Dry dressing applied.    I will hold Eliquis until initial work up completed in case operative intervention required.    Glenna Fellows, MD [facility]astic & Reconstructive Surgery  Office/ physician access line after hours [phone]

## 2024-03-12 NOTE — Anesthesia Preprocedure Evaluation (Signed)
 Anesthesia Evaluation  Patient identified by MRN, date of birth, ID band Patient awake    Reviewed: Allergy & Precautions, NPO status , Patient's Chart, lab work & pertinent test results, reviewed documented beta blocker date and time   Airway Mallampati: II  TM Distance: >3 FB Neck ROM: Full    Dental  (+) Dental Advisory Given, Edentulous Upper   Pulmonary COPD, former smoker   Pulmonary exam normal breath sounds clear to auscultation       Cardiovascular hypertension, Pt. on medications and Pt. on home beta blockers + CAD and + Peripheral Vascular Disease (s/p open AAA repair)  + dysrhythmias Atrial Fibrillation  Rhythm:Regular Rate:Tachycardia     Neuro/Psych  PSYCHIATRIC DISORDERS Anxiety Depression       GI/Hepatic   Endo/Other    Renal/GU      Musculoskeletal   Abdominal   Peds  Hematology  (+) Blood dyscrasia (Eliquis), anemia   Anesthesia Other Findings Necrotising fasciitis   Reproductive/Obstetrics                             Anesthesia Physical Anesthesia Plan  ASA: 4 and emergent  Anesthesia Plan: General   Post-op Pain Management: Ofirmev IV (intra-op)*   Induction: Intravenous  PONV Risk Score and Plan: 3 and Dexamethasone and Ondansetron  Airway Management Planned: Oral ETT  Additional Equipment: Arterial line  Intra-op Plan:   Post-operative Plan: Possible Post-op intubation/ventilation  Informed Consent: I have reviewed the patients History and Physical, chart, labs and discussed the procedure including the risks, benefits and alternatives for the proposed anesthesia with the patient or authorized representative who has indicated his/her understanding and acceptance.     Dental advisory given  Plan Discussed with: CRNA  Anesthesia Plan Comments:        Anesthesia Quick Evaluation

## 2024-03-12 NOTE — Progress Notes (Signed)
 Patient ID: Ashley Robertson, female   DOB: [birth year], 68 y.o.   MRN: [id number] Patient known to me from breast cancer.  Recent history discussed with Dr Leta Baptist.  Patient with significant cellulitis to right chest and ulceration/epidermolysis with necrosis.  Drain in place serosang. Her wbc elevated, lactic acid elevated, tachycardic. Concern for NSTI.  Took eliquis 8 am. Needs urgent cw debridement. Will give andexxa due to eliquis this am. Discussed with patient.

## 2024-03-12 NOTE — Progress Notes (Signed)
   03/12/24 1521  Assess: MEWS Score  Temp 98.4 F (36.9 C)  BP 112/66  MAP (mmHg) 81  Pulse Rate (!) 117  Resp 16  SpO2 98 %  O2 Device Room Air  Assess: MEWS Score  MEWS Temp 0  MEWS Systolic 0  MEWS Pulse 2  MEWS RR 0  MEWS LOC 0  MEWS Score 2  MEWS Score Color Yellow  Assess: if the MEWS score is Yellow or Red  Were vital signs accurate and taken at a resting state? Yes  Does the patient meet 2 or more of the SIRS criteria? No  MEWS guidelines implemented  Yes, yellow  Treat  MEWS Interventions Considered administering scheduled or prn medications/treatments as ordered  Take Vital Signs  Increase Vital Sign Frequency  Yellow: Q2hr x1, continue Q4hrs until patient remains green for 12hrs  Escalate  MEWS: Escalate Yellow: Discuss with charge nurse and consider notifying provider and/or RRT  Notify: Charge Nurse/RN  Name of Charge Nurse/RN Notified Karolee Ohs RN  Assess: SIRS CRITERIA  SIRS Temperature  0  SIRS Respirations  0  SIRS Pulse 1  SIRS WBC 0  SIRS Score Sum  1

## 2024-03-12 NOTE — Consult Note (Addendum)
 Cardiology Consultation   Patient ID: Ashley Robertson MRN: [id number]; DOB: [birth year]  Admit date: 03/12/2024 Date of Consult: 03/12/2024  PCP:  Gweneth Dimitri, MD   [facility] Providers Cardiologist:  Bryan Lemma, MD        Patient Profile:   Ashley Robertson is a 68 y.o. female with a hx of hypertension, hyperlipidemia, post-op atrial fibrillation, CAD, carotid artery disease, emphysema, AAA s/p repair, breast cancer status postmastectomy, radiation, chemo, depression presenting with worsening cellulitis of right breast reconstruction, taken overnight to the OR given concern for necrotizing soft tissue infection. Being evaluated 03/12/2024 for atrial fibrillation at the request of Dr. Emelia Loron.  History of Present Illness:   86F with a hx of hypertension, hyperlipidemia, post-op atrial fibrillation, CAD, carotid artery disease, emphysema, AAA s/p repair, breast cancer status postmastectomy, radiation, chemo, depression presenting with worsening cellulitis of right breast reconstruction, taken overnight to the OR given concern for necrotizing soft tissue infection. Being evaluated 03/12/2024 for atrial fibrillation at the request of Dr. Emelia Loron.  Has a history of post-op AF, appears to be first diagnosed in 02/13/23 after she underwent a AAA repair. Was started on eliquis then and converted to NSR with amiodarone. Eventually [facility] and amiodarone were stopped in 02/2024. Had another episode of AF after an infection of the right breast implant from her staged breast reconstruction 03/07/24-03/08/24 so was restarted on eliquis again recently. Not currently on AAD. Last noted to be sinus rhythm on EKG 03/10/24 when she presented to her cardiology appointment.  Now presents 03/12/24 for again worsening drainage from right breast reconstruction, concern for necrotizing soft tissue infection, and appreciated to be in atrial fibrillation on EKG. She was started on broad spectrum  antibiotics, eliquis was reversed with Kcentra, and she was taken to the OR for I&D.  I saw her after her surgery. She feels well and is thankful that the surgery went well. She notes that she was started back on eliquis on Sunday 03/08/24 and has been taking it as prescribed until she got admitted to the hospital today. She monitors her HR at home and notes that last night she felt she could be back in atrial fibrillation so checked her HR and found that it was in the 100's (normally she's in the 50-60's so she thought she was likely in AF). Doesn't always check her HR at home, but was notably in NSR on 03/10/24 during her cardiology visit. No prior history of stroke.  Past Medical History:  Diagnosis Date   AAA (abdominal aortic aneurysm) (HCC) 01/11/2023   Allergy    Lisinopril and lipitor   Anxiety and depression    Aortic atherosclerosis (HCC) 03/2020   CT Chest: 2 V (LAD & LCx) Coronary Atherosclerosis, Aortic Atherosclerosis (no aneurysm).  Mild centrilobular emphysema with mild diffuse bronchial thickening; several scattered small solitary pulmonary nodules (largest 5.6 mm in anterior left upper lobe)   Breast cancer (HCC) 12/2021   Right breast ILC   Carotid artery plaque, bilateral 10/2015   Mild to moderate plaque L>R without significant stenosis   COPD (chronic obstructive pulmonary disease) (HCC)    Coronary Artery Calcification - Score 79    Coronary Calcium Score 79.  LAD and circumflex calcification noted.  Normal ascending aorta with mild calcification.   Current every day smoker    pt quit smoking April 2023   Dysrhythmia 01/2023   developed post op a-fib after AAA repair, converted with amiodarone   Emphysema lung (  HCC)    Noted on chest CT   Family history of breast cancer 02/21/2022   Family history of prostate cancer 02/21/2022   Hyperlipidemia    Hypertension    Controlled with amlodipine   Prediabetes    Skin cancer 2021   Remove 2022    Past Surgical History:   Procedure Laterality Date   ABDOMINAL AORTIC ANEURYSM REPAIR N/A 01/11/2023   Procedure: ANEURYSM ABDOMINAL AORTIC REPAIR;  Surgeon: Nada Libman, MD;  Location: [facility] OR;  Service: Vascular;  Laterality: N/A;   BREAST BIOPSY Right    times 3   BREAST RECONSTRUCTION WITH PLACEMENT OF TISSUE EXPANDER AND ALLODERM Right 07/26/2022   Procedure: RIGHT BREAST RECONSTRUCTION WITH PLACEMENT OF TISSUE EXPANDER AND ALLODERM;  Surgeon: Glenna Fellows, MD;  Location: [facility] OR;  Service: Plastics;  Laterality: Right;   CESAREAN SECTION  1990, 1991   COSMETIC SURGERY  1981   Rhinoplasty   MASTECTOMY Right    MASTECTOMY W/ SENTINEL NODE BIOPSY Right 07/26/2022   Procedure: RIGHT MASTECTOMY, RIGHT AXILLARY SENTINEL NODE BIOPSY;  Surgeon: Emelia Loron, MD;  Location: [facility] OR;  Service: General;  Laterality: Right;  GEN & PEC BLOCK   MASTOPEXY Left 12/20/2023   Procedure: MASTOPEXY;  Surgeon: Glenna Fellows, MD;  Location: [facility];  Service: Plastics;  Laterality: Left;   PORT-A-CATH REMOVAL Left 12/20/2023   Procedure: REMOVAL PORT-A-CATH;  Surgeon: Glenna Fellows, MD;  Location: [facility];  Service: Plastics;  Laterality: Left;   PORTACATH PLACEMENT Left 03/06/2022   Procedure: INSERTION PORT-A-CATH;  Surgeon: Emelia Loron, MD;  Location: [facility];  Service: General;  Laterality: Left;   RADIOACTIVE SEED GUIDED AXILLARY SENTINEL LYMPH NODE Right 07/26/2022   Procedure: RADIOACTIVE SEED GUIDED AXILLARY SENTINEL LYMPH NODE DISSECTION;  Surgeon: Emelia Loron, MD;  Location: [facility];  Service: General;  Laterality: Right;   REMOVAL OF TISSUE EXPANDER AND PLACEMENT OF IMPLANT Right 12/20/2023   Procedure: REMOVAL OF TISSUE EXPANDER AND PLACEMENT OF SILICONE IMPLANT;  Surgeon: Glenna Fellows, MD;  Location: [facility];  Service: Plastics;  Laterality: Right;   REMOVAL OF TISSUE EXPANDER AND PLACEMENT OF IMPLANT Right 03/07/2024    Procedure: REMOVAL RIGHT CHEST IMPLANT;  Surgeon: Glenna Fellows, MD;  Location: [facility];  Service: Plastics;  Laterality: Right;  REMOVAL OF IMPLANT   RHINOPLASTY  1981   TONSILLECTOMY  1978   TRANSTHORACIC ECHOCARDIOGRAM  10/2016   EF 55 to 60%.  Normal systolic and diastolic function.  No ASD/PFO   TUBAL LIGATION  1991     Home Medications:  Prior to Admission medications   Medication Sig Start Date End Date Taking? Authorizing Provider  albuterol (PROVENTIL HFA) 108 (90 Base) MCG/ACT inhaler Inhale 1 to 2 puffs into the lungs every 6 hours as needed 01/25/22  Yes   alendronate (FOSAMAX) 70 MG tablet Take 1 tablet (70 mg total) by mouth once a week. Take with plain water 30 minutes before the first food, beverage, or medicine of the day. Patient taking differently: Take 70 mg by mouth every Sunday. Take with plain water 30 minutes before the first food, beverage, or medicine of the day. 02/24/24  Yes   amLODipine (NORVASC) 10 MG tablet Take 1 tablet (10 mg total) by mouth at bedtime. 02/24/24  Yes BranchAlben Spittle, MD  anastrozole (ARIMIDEX) 1 MG tablet Take 1 tablet (1 mg) by mouth daily. Patient taking differently: Take 1 mg by mouth at bedtime. 05/09/23  Yes  Malachy Mood, MD  apixaban (ELIQUIS) 5 MG TABS tablet Take 1 tablet (5 mg total) by mouth 2 (two) times daily. 03/08/24  Yes Danford, Earl Lites, MD  Calcium Carb-Cholecalciferol (CALCIUM + VITAMIN D3 PO) Take 1 tablet by mouth at bedtime.   Yes [provider]  carvedilol (COREG) 6.25 MG tablet Take 1 tablet (6.25 mg total) by mouth 2 (two) times daily. 09/30/23  Yes Marykay Lex, MD  Cholecalciferol (VITAMIN D-3 PO) Take 1 tablet by mouth at bedtime.   Yes [provider]  ezetimibe (ZETIA) 10 MG tablet Take 1 tablet (10 mg total) by mouth daily. Patient taking differently: Take 10 mg by mouth at bedtime. 01/23/24  Yes Maisie Fus, MD  FLUoxetine (PROZAC) 20 MG capsule Take 1 capsule (20 mg total) by mouth  daily. Patient taking differently: Take 20 mg by mouth at bedtime. 04/11/23  Yes   Fluticasone-Umeclidin-Vilant (TRELEGY ELLIPTA) 200-62.5-25 MCG/ACT AEPB Inhale 1 puff into the lungs daily. 12/27/23  Yes Mannam, Praveen, MD  loratadine (CLARITIN) 10 MG tablet Take 10 mg by mouth daily as needed for allergies.   Yes [provider]  Prenatal Vit-Fe Fumarate-FA (PRENATAL PO) Take 1 tablet by mouth at bedtime.   Yes [provider]  rosuvastatin (CRESTOR) 5 MG tablet Take 1 tablet by mouth daily at bedtime ,except on Monday, Wednesday and Fridays take 2 tablets at bedtime as directed 11/21/23  Yes Marykay Lex, MD  sulfamethoxazole-trimethoprim (BACTRIM DS) 800-160 MG tablet Take 1 tablet by mouth 2 (two) times daily.   Yes [provider]  [name] Maleate (NERLYNX) 40 MG tablet Take 6 tablets (240 mg total) by mouth daily. Take with food. Patient not taking: Reported on 03/12/2024 02/11/24   Malachy Mood, MD  hydrochlorothiazide (HYDRODIURIL) 25 MG tablet Take 1 tablet (25 mg total) by mouth daily. Patient not taking: Reported on 02/05/2024 10/06/20 12/13/20  Marykay Lex, MD  lisinopril (ZESTRIL) 5 MG tablet Take 1 tablet (5 mg total) by mouth daily. Patient not taking: Reported on 02/05/2024 08/30/20 09/10/20  Marykay Lex, MD    Inpatient Medications: Scheduled Meds:  [MAR Hold] amLODipine  10 mg Oral QHS   [MAR Hold] budeson-glycopyrrolate-formoterol  2 puff Inhalation BID   [MAR Hold] carvedilol  6.25 mg Oral BID   [MAR Hold] enoxaparin (LOVENOX) injection  40 mg Subcutaneous Q24H   [MAR Hold] ezetimibe  10 mg Oral Daily   [MAR Hold] FLUoxetine  20 mg Oral Daily   [MAR Hold] rosuvastatin  10 mg Oral Q M,W,F   And   [MAR Hold] rosuvastatin  5 mg Oral Once per day on Sunday Tuesday Thursday Saturday   Continuous Infusions:  sodium chloride 100 mL/hr at 03/12/24 1536   lactated ringers 10 mL/hr at 03/12/24 1839   [MAR Hold] linezolid (ZYVOX) IV 600 mg (03/12/24  1809)   [MAR Hold] piperacillin-tazobactam (ZOSYN)  IV     [MAR Hold] potassium chloride 0 mEq (03/12/24 1850)   PRN Meds: [MAR Hold] albuterol, [MAR Hold] methocarbamol, [MAR Hold] ondansetron **OR** [MAR Hold] ondansetron (ZOFRAN) IV, [MAR Hold] oxyCODONE  Allergies:    Allergies  Allergen Reactions   Zestril [Lisinopril] Other (See Comments) and Cough    Hyperkalemia    Lipitor [Atorvastatin] Other (See Comments)    Myalgias    Social History:   Social History   Socioeconomic History   Marital status: Married    Spouse name: Not on file   Number of children: 2  Years of education: Not on file   Highest education level: Bachelor's degree (e.g., BA, AB, BS)  Occupational History   Occupation: OR Academic librarian: [employer]    Comment: Retired  Tobacco Use   Smoking status: Former    Current packs/day: 0.00    Average packs/day: 1 pack/day for 40.0 years (40.0 ttl pk-yrs)    Types: Cigarettes    Start date: 04/01/1982    Quit date: 04/01/2022    Years since quitting: 1.9   Smokeless tobacco: Never  Vaping Use   Vaping status: Former   Devices: zero nicotine vape  Substance and Sexual Activity   Alcohol use: Yes    Alcohol/week: 6.0 standard drinks of alcohol    Types: 6 Glasses of wine per week    Comment: social 4-6 glasses wine weekly per pt   Drug use: No   Sexual activity: Not Currently    Birth control/protection: Post-menopausal, Surgical    Comment: BTL  Other Topics Concern   Not on file  Social History Narrative   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  She used to work at [employer].     Has 2 daughters.   Quit smoking 04/01/22.   Drinks 4 to 6 cups of coffee a day.      Currently trying Intermittent Fasting diet for weight loss.  Does not routinely exercise.   Social Drivers of Corporate investment banker Strain: Low Risk  (02/21/2022)   Overall Financial Resource Strain  (CARDIA)    Difficulty of Paying Living Expenses: Not hard at all  Food Insecurity: No Food Insecurity (03/12/2024)   Hunger Vital Sign    Worried About Running Out of Food in the Last Year: Never true    Ran Out of Food in the Last Year: Never true  Transportation Needs: No Transportation Needs (03/12/2024)   PRAPARE - Administrator, Civil Service (Medical): No    Lack of Transportation (Non-Medical): No  Physical Activity: Not on file  Stress: Not on file  Social Connections: Moderately Isolated (03/12/2024)   Social Connection and Isolation Panel [NHANES]    Frequency of Communication with Friends and Family: Three times a week    Frequency of Social Gatherings with Friends and Family: Three times a week    Attends Religious Services: Never    Active Member of Clubs or Organizations: No    Attends Banker Meetings: Never    Marital Status: Married  Catering manager Violence: Not At Risk (03/12/2024)   Humiliation, Afraid, Rape, and Kick questionnaire    Fear of Current or Ex-Partner: No    Emotionally Abused: No    Physically Abused: No    Sexually Abused: No    Family History:   Family History  Problem Relation Age of Onset   Transient ischemic attack Mother 95   Parkinson's disease Father    Hyperlipidemia Father    Hypertension Father    Cancer - Other Father        sarcoma--left shoulder   Prostate cancer Father        dx after 42   Healthy Sister    Hypertension Sister    Lung cancer Brother        dx 72s   Healthy Brother    Healthy Brother    Skin cancer Maternal Aunt    Breast cancer Paternal Aunt  dx after 50   Lung cancer Maternal Grandmother        dx 54s   Liver cancer Maternal Grandfather        dx 77s   Breast cancer Niece        dx 30s     ROS:  Please see the history of present illness.  All other ROS reviewed and negative.     Physical Exam/Data:   Vitals:   03/12/24 1521 03/12/24 1834 03/12/24 2000  BP:  112/66 111/61 112/68  Pulse: (!) 117 (!) 122 (!) 112  Resp: 16 16 20   Temp: 98.4 F (36.9 C)  98.9 F (37.2 C)  TempSrc:  Oral   SpO2: 98%  96%    Intake/Output Summary (Last 24 hours) at 03/12/2024 2001 Last data filed at 03/12/2024 1945 Gross per 24 hour  Intake 850 ml  Output 100 ml  Net 750 ml      03/10/2024    8:47 AM 03/07/2024    2:09 PM 03/07/2024    8:48 AM  Last 3 Weights  Weight (lbs) 143 lb 141 lb 6.4 oz 144 lb  Weight (kg) 64.864 kg 64.139 kg 65.318 kg     There is no height or weight on file to calculate BMI.  General:  In NAD HEENT: normal Vascular: No carotid bruits; Distal pulses 2+ bilaterally Cardiac:  tachycardic, irregularly irregular Lungs:  clear to auscultation bilaterally, no wheezing, rhonchi or rales  Abd: soft, nontender, no hepatomegaly  Ext: no edema Musculoskeletal:  No deformities, BUE and BLE strength normal and equal Skin: warm and dry  Neuro:  CNs 2-12 intact, no focal abnormalities noted Psych:  Normal affect   EKG:  The EKG was personally reviewed and demonstrates:  atrial fibrillation Telemetry:  Telemetry was personally reviewed and demonstrates:  atrial fibrillation  Relevant CV Studies: TTE 03/04/24:  1. Left ventricular ejection fraction, by estimation, is 50 to 55%. The  left ventricle has low normal function. The left ventricle has no regional  wall motion abnormalities.   2. Right ventricular systolic function is normal. The right ventricular  size is normal.   3. The aortic valve was not well visualized. Aortic valve regurgitation  is not visualized. Mild aortic valve stenosis. Vmax 2.5 m/s, MG ,  AVA 1.5 cm^2, DI 0.52   Laboratory Data:  High Sensitivity Troponin:  No results for input(s): "TROPONINIHS" in the last 720 hours.   Chemistry Recent Labs  Lab 03/07/24 0858 03/07/24 1444 03/08/24 0417 03/12/24 1602  NA 135  132* 134* 142 133*  K 2.7*  2.6* 3.4* 4.8 2.9*  CL 100  95* 96* 102 92*  CO2 26  22 26  30 27   GLUCOSE 118*  122* 145* 166* 120*  BUN 16  14 12 15 9   CREATININE 0.62  0.73 0.77 0.73 0.61  CALCIUM 9.0  9.0 8.8* 8.5* 8.1*  MG 2.1  --  2.3  --   GFRNONAA >60  >60 >60 >60 >60  ANIONGAP 9  15 12 10 14     Recent Labs  Lab 03/07/24 1444 03/08/24 0417 03/12/24 1602  PROT 6.8 6.3* 5.9*  ALBUMIN 2.8* 2.5* 2.2*  AST 19 18 21   ALT 18 15 15   ALKPHOS 107 99 115  BILITOT 0.7 0.6 0.9   Lipids No results for input(s): "CHOL", "TRIG", "HDL", "LABVLDL", "LDLCALC", "CHOLHDL" in the last 168 hours.  Hematology Recent Labs  Lab 03/07/24 0858 03/08/24 0417 03/12/24 1602  WBC 8.9 4.8  17.6*  RBC 3.94 3.74* 3.54*  HGB 13.1 12.5 11.7*  HCT 38.8 36.2 33.9*  MCV 98.5 96.8 95.8  MCH 33.2 33.4 33.1  MCHC 33.8 34.5 34.5  RDW 13.8 13.7 13.2  PLT 154 153 231   Thyroid  Recent Labs  Lab 03/07/24 2025  TSH 0.574  FREET4 1.34*    BNPNo results for input(s): "BNP", "PROBNP" in the last 168 hours.  DDimer No results for input(s): "DDIMER" in the last 168 hours.  Radiology/Studies:  No results found.   Assessment and Plan:   76F with a hx of hypertension, hyperlipidemia, post-op atrial fibrillation, CAD, carotid artery disease, emphysema, AAA s/p repair, breast cancer status postmastectomy, radiation, chemo, depression presenting with worsening cellulitis of right breast reconstruction, taken overnight to the OR given concern for necrotizing soft tissue infection. Being evaluated 03/12/2024 for atrial fibrillation at the request of Dr. Emelia Loron.  #Perioperative atrial fibrillation Now presenting with worsening infection at right breast reconstruction with concern for necrotizing soft tissue infection requiring reversal of eliquis with Kcentra. Found to be in atrial fibrillation on today's EKG, with last EKG from 03/10/24 demonstrating NSR. Based on her symptoms and home HR recordings, appears to likely have gone into atrial fibrillation last night prior to presentation to  the hospital. Currently on home coreg 6.25mg  BID. She has a C2V 4 (CAD, female, age x1, HTN), but cannot currently be on [facility] given perioperative status and need for wound healing. As she's been in AF for <48 hours, her risk for having developed LAA thrombus is low and thus we can start rhythm control with attempts at attaining sinus rhythm before she develops thrombus. Will start on IV amiodarone now. Prefer rhythm control over intensifying home rate control given her sinus rates of 50-60s on her home coreg already. Recommend restarting anticoagulation whenever safe from surgical perspective. She'll need to be discharged on amiodarone and anticoagulation for at least 1 month afterwards for post-op AF. Given her outpatient workup looking for occult episodes of AF with an outpatient 14d cardiac monitor, I would favor continuing anticoagulation indefinitely until we can prove she is not having other episodes of AF that we are not capturing. - START IV amiodarone bolus and gtt, then amio 400mg  BID for 5-6g load, then transition to 200mg  daily - Recommend restarting anticoagulation (either home eliquis 5mg  BID or IV heparin gtt) whenever safe from surgical perspective; will need to discharge with eliquis 5mg  BID - NPO at [facility] in case needs DCCV prior to the weekend (would need to be on [facility] prior to DCCV) - Scheduled for outpatient 14d cardiac monitor to assess for further occult episodes of AF that may indicate more than just perioperative AF  Risk Assessment/Risk Scores:     CHA2DS2-VASc Score = 4  This indicates a 4.8% annual risk of stroke. The patient's score is based upon: CHF History: 0 HTN History: 1 Diabetes History: 0 Stroke History: 0 Vascular Disease History: 1 Age Score: 1 Gender Score: 1         For questions or updates, please contact [facility] Please consult [email or web address] for contact info under    Signed, Bella Kennedy, MD  03/12/2024 8:01 PM

## 2024-03-12 NOTE — Transfer of Care (Signed)
 Immediate Anesthesia Transfer of Care Note  Patient: Ashley Robertson  Procedure(s) Performed: IRRIGATION AND DEBRIDEMENT WOUND (Right)  Patient Location: PACU  Anesthesia Type:General  Level of Consciousness: awake, alert , patient cooperative, and responds to stimulation  Airway & Oxygen Therapy: Patient Spontanous Breathing and Patient connected to nasal cannula oxygen  Post-op Assessment: Report given to RN and Post -op Vital signs reviewed and stable  Post vital signs: Reviewed and stable  Last Vitals:  Vitals Value Taken Time  BP 112/68 03/12/24 2000  Temp 37.3 03/12/24 2000  Pulse 112 03/12/24 2000  Resp 20 03/12/24 2000  SpO2 96 % 03/12/24 2000  Vitals shown include unfiled device data. Pt denies pain      Complications: No notable events documented.

## 2024-03-12 NOTE — Plan of Care (Signed)
   Problem: Education: Goal: Knowledge of General Education information will improve Description Including pain rating scale, medication(s)/side effects and non-pharmacologic comfort measures Outcome: Progressing   Problem: Health Behavior/Discharge Planning: Goal: Ability to manage health-related needs will improve Outcome: Progressing

## 2024-03-13 ENCOUNTER — Encounter (HOSPITAL_COMMUNITY): Payer: Self-pay | Admitting: General Surgery

## 2024-03-13 DIAGNOSIS — I4819 Other persistent atrial fibrillation: Secondary | ICD-10-CM | POA: Diagnosis not present

## 2024-03-13 DIAGNOSIS — L03313 Cellulitis of chest wall: Secondary | ICD-10-CM | POA: Diagnosis not present

## 2024-03-13 LAB — CBC
HCT: 33.2 % — ABNORMAL LOW (ref 36.0–46.0)
Hemoglobin: 11.4 g/dL — ABNORMAL LOW (ref 12.0–15.0)
MCH: 33 pg (ref 26.0–34.0)
MCHC: 34.3 g/dL (ref 30.0–36.0)
MCV: 96.2 fL (ref 80.0–100.0)
Platelets: 222 10*3/uL (ref 150–400)
RBC: 3.45 MIL/uL — ABNORMAL LOW (ref 3.87–5.11)
RDW: 13.4 % (ref 11.5–15.5)
WBC: 9.2 10*3/uL (ref 4.0–10.5)
nRBC: 0 % (ref 0.0–0.2)

## 2024-03-13 LAB — APTT: aPTT: 52 s — ABNORMAL HIGH (ref 24–36)

## 2024-03-13 LAB — BASIC METABOLIC PANEL WITH GFR
Anion gap: 10 (ref 5–15)
BUN: 8 mg/dL (ref 8–23)
CO2: 29 mmol/L (ref 22–32)
Calcium: 8.2 mg/dL — ABNORMAL LOW (ref 8.9–10.3)
Chloride: 95 mmol/L — ABNORMAL LOW (ref 98–111)
Creatinine, Ser: 0.66 mg/dL (ref 0.44–1.00)
GFR, Estimated: >60 mL/min (ref >60)
Glucose, Bld: 205 mg/dL — ABNORMAL HIGH (ref 70–99)
Potassium: 3.1 mmol/L — ABNORMAL LOW (ref 3.5–5.1)
Sodium: 134 mmol/L — ABNORMAL LOW (ref 135–145)

## 2024-03-13 LAB — HEPARIN LEVEL (UNFRACTIONATED): Heparin Unfractionated: >1.1 [IU]/mL — ABNORMAL HIGH (ref 0.30–0.70)

## 2024-03-13 MED ORDER — POTASSIUM CHLORIDE CRYS ER 20 MEQ PO TBCR
40.0000 meq | EXTENDED_RELEASE_TABLET | Freq: Two times a day (BID) | ORAL | Status: AC
  Administered 2024-03-13 – 2024-03-14 (×3): 40 meq via ORAL
  Filled 2024-03-13 (×3): qty 2

## 2024-03-13 MED ORDER — AMIODARONE HCL 200 MG PO TABS
400.0000 mg | ORAL_TABLET | Freq: Two times a day (BID) | ORAL | Status: DC
  Administered 2024-03-14 – 2024-03-16 (×5): 400 mg via ORAL
  Filled 2024-03-13 (×5): qty 2

## 2024-03-13 MED ORDER — ACETAMINOPHEN 325 MG PO TABS
650.0000 mg | ORAL_TABLET | Freq: Four times a day (QID) | ORAL | Status: DC | PRN
  Administered 2024-03-16: 650 mg via ORAL
  Filled 2024-03-13: qty 2

## 2024-03-13 MED ORDER — AMIODARONE HCL 200 MG PO TABS: 200.0000 mg | ORAL_TABLET | Freq: Every day | ORAL | Status: DC

## 2024-03-13 MED ORDER — HEPARIN (PORCINE) 25000 UT/250ML-% IV SOLN
1450.0000 [IU]/h | INTRAVENOUS | Status: DC
  Administered 2024-03-13: 950 [IU]/h via INTRAVENOUS
  Administered 2024-03-14: 1400 [IU]/h via INTRAVENOUS
  Filled 2024-03-13 (×2): qty 250

## 2024-03-13 NOTE — Progress Notes (Signed)
 This patient was seen in consultation yesterday by our [facility] provider.  Patient is here with infected breast implant, A-fib with RVR overnight.  Surgery is primary.  Patient is followed by cardiology for A-fib.  Infectious disease for antibiotic management.  I discussed with surgical services.  Since patient has adequate providers following up, currently [facility] will sign off.  Please reach out to [place] for any questions .or assistance that we can help with.   No charge.

## 2024-03-13 NOTE — Progress Notes (Signed)
 PHARMACY - ANTICOAGULATION CONSULT NOTE  Pharmacy Consult for heparin  Indication: atrial fibrillation  Allergies  Allergen Reactions   Zestril [Lisinopril] Other (See Comments) and Cough    Hyperkalemia    Lipitor [Atorvastatin] Other (See Comments)    Myalgias    Patient Measurements:    Vital Signs: Temp: 98.3 F (36.8 C) (04/11 1622) Temp Source: Oral (04/11 1622) BP: 94/84 (04/11 1622) Pulse Rate: 59 (04/11 1622)  Labs: Recent Labs    03/12/24 1602 03/13/24 0538 03/13/24 1553  HGB 11.7* 11.4*  --   HCT 33.9* 33.2*  --   PLT 231 222  --   APTT  --   --  52*  HEPARINUNFRC  --   --  >1.10*  CREATININE 0.61 0.66  --     Estimated Creatinine Clearance: 69 mL/min (by C-G formula based on SCr of 0.66 mg/dL).   Medical History: Past Medical History:  Diagnosis Date   AAA (abdominal aortic aneurysm) (HCC) 01/11/2023   Allergy    Lisinopril and lipitor   Anxiety and depression    Aortic atherosclerosis (HCC) 03/2020   CT Chest: 2 V (LAD & LCx) Coronary Atherosclerosis, Aortic Atherosclerosis (no aneurysm).  Mild centrilobular emphysema with mild diffuse bronchial thickening; several scattered small solitary pulmonary nodules (largest 5.6 mm in anterior left upper lobe)   Breast cancer (HCC) 12/2021   Right breast ILC   Carotid artery plaque, bilateral 10/2015   Mild to moderate plaque L>R without significant stenosis   COPD (chronic obstructive pulmonary disease) (HCC)    Coronary Artery Calcification - Score 79    Coronary Calcium Score 79.  LAD and circumflex calcification noted.  Normal ascending aorta with mild calcification.   Current every day smoker    pt quit smoking April 2023   Dysrhythmia 01/2023   developed post op a-fib after AAA repair, converted with amiodarone   Emphysema lung (HCC)    Noted on chest CT   Family history of breast cancer 02/21/2022   Family history of prostate cancer 02/21/2022   Hyperlipidemia    Hypertension    Controlled  with amlodipine   Prediabetes    Skin cancer 2021   Remove 2022    Assessment: Pt admitted for cellulitis of right breast reconstruction, taken to OR overnight for concern of necrotizing soft tissue infection. Now developed peri-op afib w/ RVR in setting on known hx of Afib. Pt on Eliquis PTA last dose 4/10 AM s/p Kcentra on 4/10. HgB 11.4 and PLTs 222. Pharmacy consulted to dose heparin gtt.   Heparin level is elevated (>1.1) likely d/t recent apixaban, aPTT is subtherapeutic at 52, on 950 units/hr. No s/sx of bleeding or infusion issues per RN.   Goal of Therapy:  Heparin level 0.3-0.7 units/ml Monitor platelets by anticoagulation protocol: Yes   Plan:  Increase heparin infusion to 1150 units/hr  Check anti-Xa level in 6 hours and daily while on heparin, will also check aPTT given last dose within the past 48 hours.  Continue to monitor H&H and platelets  Thank you for allowing pharmacy to participate in this patient's care,  Sherron Monday, PharmD, BCCCP Clinical Pharmacist  Phone: [phone] 03/13/2024 5:13 PM  Please check AMION for all [facility] Pharmacy phone numbers After 10:00 PM, call Main Pharmacy [phone]

## 2024-03-13 NOTE — Progress Notes (Addendum)
 Patient Name: Ashley Robertson Date of Encounter: 03/13/2024 [facility] Cardiologist: Bryan Lemma, MD    Interval Summary  .    Patient resting comfortably in the recliner with daughter in room She says she feels much better when compared to yesterday  She states that her normal resting HR is mid-high 50s  She denies any current chest pain, shortness of breath, palpitations  Will be starting IV heparin for [facility], can switch to Eliquis once confirmed there is no more future procedures Transitioning from IV to PO amiodarone this evening   Vital Signs .    Vitals:   03/12/24 2053 03/12/24 2302 03/13/24 0330 03/13/24 0815  BP: 93/63 94/63 90/67  117/84  Pulse: (!) 118 (!) 109 91 (!) 118  Resp: (!) 26 13 18    Temp: 98.7 F (37.1 C) 98.4 F (36.9 C) 97.7 F (36.5 C) (!) 97.4 F (36.3 C)  TempSrc:  Oral Oral Axillary  SpO2: 90% 96% 97% 97%   Intake/Output Summary (Last 24 hours) at 03/13/2024 0835 Last data filed at 03/13/2024 0602 Gross per 24 hour  Intake 1650 ml  Output 1900 ml  Net -250 ml      03/10/2024    8:47 AM 03/07/2024    2:09 PM 03/07/2024    8:48 AM  Last 3 Weights  Weight (lbs) 143 lb 141 lb 6.4 oz 144 lb  Weight (kg) 64.864 kg 64.139 kg 65.318 kg      Telemetry/ECG    Atrial fibrillation, HR 90-110s - Personally Reviewed  Physical Exam .   GEN: No acute distress, on room air.   Neck: No JVD Cardiac: irregularly irregular rhythm, tachycardic, no murmurs, rubs, or gallops.  Respiratory: Clear to auscultation bilaterally. GI: Soft, nontender, non-distended  MS: No edema  Assessment & Plan .   Ashley Robertson is a 68 y.o. female with a hx of hypertension, hyperlipidemia, post-op atrial fibrillation, CAD, carotid artery disease, emphysema, AAA s/p repair, breast cancer status postmastectomy, radiation, chemo, depression presenting with worsening cellulitis of right breast reconstruction, taken overnight to the OR given concern for necrotizing soft  tissue infection. She was seen on 4/10 for peri/post operative atrial fibrillation.   Perioperative atrial fibrillation with RVR  Patient was in hospital for I&D of right breast cellulitis, 4/10 She was noted to be in atrial fibrillation with RVR on post-op EKG EKG from 4/8 demonstrated NSR Presumed onset to be night prior to presentation to the hospital CHA2DS2-VASc score of 4 (HTN, CAD, age, gender) Anticoagulation was held due to perioperative state and need for wound healing  She was started on IV amiodarone last night 4/10  Recommended starting [facility] whenever safe from surgical perspective (Eliquis 5 mg BID or IV heparin) She is still in A. Fib this morning, with HR 80-110s Transitioning from IV to PO amiodarone this afternoon (400 mg BID x 1 week then 200 mg daily) Continue CoReg 6.25 mg BID Discussed possible outpatient cardiac monitor to assess for A. Fib burden -- patient stated that Dr. Wyline Mood had already placed an order for a Zio monitor and she is to place it on when she gets home Start IV heparin per pharmacy dosing, as surgery indicated they want to hold off on Eliquis until they can ensure there are no further procedures indicated Consider DCCV if patient has been properly anticoagulated and does not convert to NSR prior to discharge   Per primary Cellulitis s/p I&D History of breast cancer Hypokalemia  HTN HLD AAA  s/p repair COPD Mood disorders  For questions or updates, please contact [facility] Please consult [email or web address] for contact info under       Signed, Olena Leatherwood, PA-C

## 2024-03-13 NOTE — Progress Notes (Addendum)
 Plastic SurgeryPlastc Surgery POD#6 removal right chest implant POD#1 debridement right chest wall  Temp:  [97.4 F (36.3 C)-99.2 F (37.3 C)] 97.4 F (36.3 C) (04/11 0815) Pulse Rate:  [91-124] 118 (04/11 0815) Resp:  [12-26] 18 (04/11 0330) BP: (90-117)/(57-84) 117/84 (04/11 0815) SpO2:  [90 %-98 %] 97 % (04/11 0815)    PE Patient in good spirits and reports feeling much better Chest: dressing with scant drainage, did not remove packing Adjacent skin erythema is still present, no further progression  A/P S/p debridement chest wall for presumed NSTI  Culture from initial removal implant 03/07/2024 MSSA- was on Bactrim at time readmission  Two cultures sent from 03/12/2024 operative debridement, one represents soft tissue/debridement specimen sent for culture, one represents swab culture implant cavity.  Specimen labeled wound right chest represents swab culture: GS with rare WBC, no organisms, no growth culture < 12 hour  Specimen labeled tissue right chest represents soft tissue culture: GS with few WBC, no organisms, culture pending.  Continues on Zyvox/Zosyn. If cultures do not show growth, and erythema chest persists and/or wound changes are progressive, consider pyoderma gangrenosum. I have also asked pathology to evaluate for this.  Glenna Fellows, MD [facility] Plastic & Reconstructive Surgery  Office/ physician access line after hours [phone]

## 2024-03-13 NOTE — Progress Notes (Addendum)
 PHARMACY - ANTICOAGULATION CONSULT NOTE  Pharmacy Consult for heparin  Indication: atrial fibrillation  Allergies  Allergen Reactions   Zestril [Lisinopril] Other (See Comments) and Cough    Hyperkalemia    Lipitor [Atorvastatin] Other (See Comments)    Myalgias    Patient Measurements:    Vital Signs: Temp: 97.4 F (36.3 C) (04/11 0815) Temp Source: Axillary (04/11 0815) BP: 117/84 (04/11 0815) Pulse Rate: 118 (04/11 0815)  Labs: Recent Labs    03/12/24 1602 03/13/24 0538  HGB 11.7* 11.4*  HCT 33.9* 33.2*  PLT 231 222  CREATININE 0.61 0.66    Estimated Creatinine Clearance: 69 mL/min (by C-G formula based on SCr of 0.66 mg/dL).   Medical History: Past Medical History:  Diagnosis Date   AAA (abdominal aortic aneurysm) (HCC) 01/11/2023   Allergy    Lisinopril and lipitor   Anxiety and depression    Aortic atherosclerosis (HCC) 03/2020   CT Chest: 2 V (LAD & LCx) Coronary Atherosclerosis, Aortic Atherosclerosis (no aneurysm).  Mild centrilobular emphysema with mild diffuse bronchial thickening; several scattered small solitary pulmonary nodules (largest 5.6 mm in anterior left upper lobe)   Breast cancer (HCC) 12/2021   Right breast ILC   Carotid artery plaque, bilateral 10/2015   Mild to moderate plaque L>R without significant stenosis   COPD (chronic obstructive pulmonary disease) (HCC)    Coronary Artery Calcification - Score 79    Coronary Calcium Score 79.  LAD and circumflex calcification noted.  Normal ascending aorta with mild calcification.   Current every day smoker    pt quit smoking April 2023   Dysrhythmia 01/2023   developed post op a-fib after AAA repair, converted with amiodarone   Emphysema lung (HCC)    Noted on chest CT   Family history of breast cancer 02/21/2022   Family history of prostate cancer 02/21/2022   Hyperlipidemia    Hypertension    Controlled with amlodipine   Prediabetes    Skin cancer 2021   Remove 2022     Assessment: Pt admitted for cellulitis of right breast reconstruction, taken to OR overnight for concern of necrotizing soft tissue infection. Now developed peri-op afib w/ RVR in setting on known hx of Afib. Pt on Eliquis PTA last dose 4/10 AM s/p Kcentra on 4/10. HgB 11.4 and PLTs 222. Pharmacy consulted to dose heparin gtt.   Goal of Therapy:  Heparin level 0.3-0.7 units/ml Monitor platelets by anticoagulation protocol: Yes   Plan:  No bolus w/ recent procedure and recent DOAC intake.  Start heparin infusion at 950 units/hr, heparin dosing weight 64.9.  Check anti-Xa level in 6 hours and daily while on heparin, will also check aPTT given last dose within the past 48 hours.  Continue to monitor H&H and platelets D/c PPX LMWH   Estill Batten, PharmD, BCCCP  03/13/2024,9:49 AM

## 2024-03-13 NOTE — Progress Notes (Signed)
 [facility]  Date of Admission:  03/12/2024        Abx: 4/10-c linezolid 4/10-c piptazo     Assessment: 68 yo female hx afib on eliquis, right breast cancer s/p neoadjuvant chemo and 3 months prior to admission left breast reduction and right breast implant exchange which was removed 4/5 for clear discharge, however, admitted for worsening cellulitis right breast   There is a drain placed 4/5 into surgical site She was given bactrim after surgery, however the rash had quickly worsened and there are echymoses/necrotic changes concerning for nec fasc   She clinically looks ok otherwise but the rash is very concerning  ------------- 03/13/24 id assessment Concern for nec fasc and taken to OR urgently for I&D Operative cx in progress 4/10 bcx in progress     Plan: F/u culture Continue linezolid/piptazo Appreciate gen surg management Standard isolation precaution Discussed with dr Leta Baptist   Principal Problem:   Cellulitis of chest wall Active Problems:   Essential hypertension   Hyperlipidemia with target LDL less than 100   Malignant neoplasm of overlapping sites of right breast in female, estrogen receptor positive (HCC)   Carotid atherosclerosis   Coronary artery disease   Recurrent major depression in remission (HCC)   S/P AAA repair   Atrial fibrillation (HCC)   Allergies  Allergen Reactions   Zestril [Lisinopril] Other (See Comments) and Cough    Hyperkalemia    Lipitor [Atorvastatin] Other (See Comments)    Myalgias    Scheduled Meds:  [START ON 03/14/2024] amiodarone  400 mg Oral Q12H   Followed by   Melene Muller ON 03/21/2024] amiodarone  200 mg Oral Daily   budeson-glycopyrrolate-formoterol  2 puff Inhalation BID   carvedilol  6.25 mg Oral BID   ezetimibe  10 mg Oral Daily   FLUoxetine  20 mg Oral Daily   potassium chloride  40 mEq Oral BID   rosuvastatin  10 mg Oral Q M,W,F   And   rosuvastatin  5 mg Oral Once  per day on Sunday Tuesday Thursday Saturday   Continuous Infusions:  sodium chloride 50 mL/hr at 03/13/24 0810   amiodarone 30 mg/hr (03/13/24 1001)   heparin 950 Units/hr (03/13/24 1058)   linezolid (ZYVOX) IV 600 mg (03/13/24 0451)   piperacillin-tazobactam (ZOSYN)  IV 3.375 g (03/13/24 1313)   PRN Meds:.acetaminophen, albuterol, methocarbamol, [DISCONTINUED] ondansetron **OR** ondansetron (ZOFRAN) IV, oxyCODONE   SUBJECTIVE: Underwent urgent I&D last pm Patient said felt much better with pain after surgery Afebrile No leukocytosis  Review of Systems: ROS All other ROS was negative, except mentioned above     OBJECTIVE: Vitals:   03/12/24 2302 03/13/24 0330 03/13/24 0815 03/13/24 1159  BP: 94/63 90/67 117/84 104/65  Pulse: (!) 109 91 (!) 118 90  Resp: 13 18  18   Temp: 98.4 F (36.9 C) 97.7 F (36.5 C) (!) 97.4 F (36.3 C)   TempSrc: Oral Oral Axillary   SpO2: 96% 97% 97% 96%   There is no height or weight on file to calculate BMI.  Physical Exam General/constitutional: no distress, pleasant HEENT: Normocephalic, PER, Conj Clear, EOMI, Oropharynx clear Neck supple CV: rrr no mrg Lungs: clear to auscultation, normal respiratory effort Abd: Soft, Nontender Ext: no edema Skin: No Rash -- didn't examine wound today Neuro: nonfocal MSK: no peripheral joint swelling/tenderness/warmth; back spines nontender  Lab Results Lab Results  Component Value Date   WBC 9.2 03/13/2024   HGB  11.4 (L) 03/13/2024   HCT 33.2 (L) 03/13/2024   MCV 96.2 03/13/2024   PLT 222 03/13/2024    Lab Results  Component Value Date   CREATININE 0.66 03/13/2024   BUN 8 03/13/2024   NA 134 (L) 03/13/2024   K 3.1 (L) 03/13/2024   CL 95 (L) 03/13/2024   CO2 29 03/13/2024    Lab Results  Component Value Date   ALT 15 03/12/2024   AST 21 03/12/2024   ALKPHOS 115 03/12/2024   BILITOT 0.9 03/12/2024      Microbiology: Recent Results (from the past 240 hours)  Aerobic/Anaerobic  Culture w Gram Stain (surgical/deep wound)     Status: None   Collection Time: 03/07/24 11:35 AM   Specimen: Wound; Abscess  Result Value Ref Range Status   Specimen Description WOUND  Final   Special Requests RIGHT CHEST WALL  Final   Gram Stain   Final    MODERATE WBC PRESENT, PREDOMINANTLY PMN MODERATE GRAM POSITIVE COCCI    Culture   Final    FEW STAPHYLOCOCCUS AUREUS NO ANAEROBES ISOLATED Performed at [facility], [address], [place], [place] [address]    Report Status 03/12/2024 FINAL  Final   Organism ID, Bacteria STAPHYLOCOCCUS AUREUS  Final      Susceptibility   Staphylococcus aureus - MIC*    CIPROFLOXACIN <=0.5 SENSITIVE Sensitive     ERYTHROMYCIN <=0.25 SENSITIVE Sensitive     GENTAMICIN <=0.5 SENSITIVE Sensitive     OXACILLIN 0.5 SENSITIVE Sensitive     TETRACYCLINE >=16 RESISTANT Resistant     VANCOMYCIN 1 SENSITIVE Sensitive     TRIMETH/SULFA <=10 SENSITIVE Sensitive     CLINDAMYCIN <=0.25 SENSITIVE Sensitive     RIFAMPIN <=0.5 SENSITIVE Sensitive     Inducible Clindamycin NEGATIVE Sensitive     LINEZOLID 2 SENSITIVE Sensitive     * FEW STAPHYLOCOCCUS AUREUS  Culture, blood (Routine X 2) w Reflex to ID Panel     Status: None (Preliminary result)   Collection Time: 03/12/24  4:02 PM   Specimen: BLOOD RIGHT HAND  Result Value Ref Range Status   Specimen Description BLOOD RIGHT HAND  Final   Special Requests   Final    BOTTLES DRAWN AEROBIC AND ANAEROBIC Blood Culture results may not be optimal due to an inadequate volume of blood received in culture bottles   Culture   Final    NO GROWTH < 24 HOURS Performed at [facility], [address], [place], [place] [address]    Report Status PENDING  Incomplete  Culture, blood (Routine X 2) w Reflex to ID Panel     Status: None (Preliminary result)   Collection Time: 03/12/24  4:03 PM   Specimen: BLOOD LEFT HAND  Result Value Ref Range Status   Specimen Description BLOOD LEFT HAND  Final   Special  Requests   Final    BOTTLES DRAWN AEROBIC AND ANAEROBIC Blood Culture results may not be optimal due to an inadequate volume of blood received in culture bottles   Culture   Final    NO GROWTH < 24 HOURS Performed at [facility], [address], [place], [place] [address]    Report Status PENDING  Incomplete  Aerobic/Anaerobic Culture w Gram Stain (surgical/deep wound)     Status: None (Preliminary result)   Collection Time: 03/12/24  8:05 PM   Specimen: Soft Tissue, Other  Result Value Ref Range Status   Specimen Description TISSUE RIGHT  CHEST  Final   Special Requests RT CHEST WALL  Final   Gram Stain   Final    FEW WBC PRESENT,BOTH PMN AND MONONUCLEAR NO ORGANISMS SEEN Performed at [facility], [address], [place], [place] [address]    Culture PENDING  Incomplete   Report Status PENDING  Incomplete  Aerobic/Anaerobic Culture w Gram Stain (surgical/deep wound)     Status: None (Preliminary result)   Collection Time: 03/12/24  8:05 PM   Specimen: Wound  Result Value Ref Range Status   Specimen Description WOUND RIGHT CHEST  Final   Special Requests RT CHEST WALL  Final   Gram Stain   Final    RARE WBC PRESENT, PREDOMINANTLY MONONUCLEAR NO ORGANISMS SEEN    Culture   Final    NO GROWTH < 12 HOURS Performed at [facility], [address], [place], [place] [address]    Report Status PENDING  Incomplete     Serology:   Imaging: If present, new imagings (plain films, ct scans, and mri) have been personally visualized and interpreted; radiology reports have been reviewed. Decision making incorporated into the Impression / Recommendations.  4/10 chest ct 1. Right lateral approach drainage catheter within the right mastectomy site, which demonstrates irregular soft tissue thickening in keeping with reported cellulitis. No discrete fluid collection. Small focus of subcutaneous emphysema adjacent to the catheter. 2. Right apical irregular consolidation  and right anterior upper lobe linear scarring, architectural distortion, and traction bronchiectasis, likely post radiation changes. 3. Multiple healing left rib fractures involving the anterolateral 6th through 8th and posterior tenth ribs. 4. Prominent subcentimeter left axillary and supraclavicular lymph nodes measuring up to 9 mm, nonspecific. 5. Aortic Atherosclerosis (ICD10-I70.0). Coronary artery calcifications. Assessment for potential risk factor modification, dietary therapy or pharmacologic therapy may be warranted, if clinically indicated.   Raymondo Band, MD [facility] [phone] pager    03/13/2024, 2:40 PM

## 2024-03-13 NOTE — Progress Notes (Signed)
 Progress Note  1 Day Post-Op  Subjective: Pt reports minimal pain to right chest wall. Discussed plans to remove packing tomorrow and changed outer dressings today. No significant bleeding noted from wound edges and packing material fairly dry.   Objective: Vital signs in last 24 hours: Temp:  [97.7 F (36.5 C)-99.2 F (37.3 C)] 97.7 F (36.5 C) (04/11 0330) Pulse Rate:  [91-124] 91 (04/11 0330) Resp:  [12-26] 18 (04/11 0330) BP: (90-115)/(57-68) 90/67 (04/11 0330) SpO2:  [90 %-98 %] 97 % (04/11 0330) Last BM Date : 03/12/24  Intake/Output from previous day: 04/10 0701 - 04/11 0700 In: 1650 [I.V.:1500; IV Piggyback:150] Out: 1900 [Urine:1800; Blood:100] Intake/Output this shift: No intake/output data recorded.  PE: General: pleasant, WD, thin female, up in chair, NAD Heart: sinus tachycardia in the low 100s on my exam Lungs/chest: Respiratory effort nonlabored, right chest wall with large defect and dry packing in place, no bleeding from wound edges, outer dressings with small amount SS drainage.  Abd: soft, NT, ND MS: moving BUEs Psych: A&Ox3 with an appropriate affect.    Lab Results:  Recent Labs    03/12/24 1602 03/13/24 0538  WBC 17.6* 9.2  HGB 11.7* 11.4*  HCT 33.9* 33.2*  PLT 231 222   BMET Recent Labs    03/12/24 1602 03/13/24 0538  NA 133* 134*  K 2.9* 3.1*  CL 92* 95*  CO2 27 29  GLUCOSE 120* 205*  BUN 9 8  CREATININE 0.61 0.66  CALCIUM 8.1* 8.2*   PT/INR No results for input(s): "LABPROT", "INR" in the last 72 hours. CMP     Component Value Date/Time   NA 134 (L) 03/13/2024 0538   NA 135 07/24/2021 0853   K 3.1 (L) 03/13/2024 0538   CL 95 (L) 03/13/2024 0538   CO2 29 03/13/2024 0538   GLUCOSE 205 (H) 03/13/2024 0538   BUN 8 03/13/2024 0538   BUN 12 07/24/2021 0853   CREATININE 0.66 03/13/2024 0538   CREATININE 0.70 02/11/2024 0931   CALCIUM 8.2 (L) 03/13/2024 0538   PROT 5.9 (L) 03/12/2024 1602   PROT 7.0 08/03/2020 0832    ALBUMIN 2.2 (L) 03/12/2024 1602   ALBUMIN 4.1 08/03/2020 0832   AST 21 03/12/2024 1602   AST 32 02/11/2024 0931   ALT 15 03/12/2024 1602   ALT 19 02/11/2024 0931   ALKPHOS 115 03/12/2024 1602   BILITOT 0.9 03/12/2024 1602   BILITOT 0.4 02/11/2024 0931   GFRNONAA >60 03/13/2024 0538   GFRNONAA >60 02/11/2024 0931   GFRAA 107 11/03/2020 1206   Lipase  No results found for: "LIPASE"     Studies/Results: No results found.  Anti-infectives: Anti-infectives (From admission, onward)    Start     Dose/Rate Route Frequency Ordered Stop   03/12/24 1800  piperacillin-tazobactam (ZOSYN) IVPB 3.375 g        3.375 g 12.5 mL/hr over 240 Minutes Intravenous Every 8 hours 03/12/24 1559     03/12/24 1700  linezolid (ZYVOX) IVPB 600 mg        600 mg 300 mL/hr over 60 Minutes Intravenous Every 12 hours 03/12/24 1555     03/12/24 1600  ceFAZolin (ANCEF) IVPB 2g/100 mL premix  Status:  Discontinued        2 g 200 mL/hr over 30 Minutes Intravenous Every 8 hours 03/12/24 1508 03/12/24 1554        Assessment/Plan Right breast cancer overlapping sites ER+ metastatic to LN Neoadjuvant chemotherapy S/p right SRM, ALND, prepectoral  TE/ADM (Alloderm) reconstruction Adjuvant RT S/p removal port, right silicone implant exchange, left mastopexy Drainage, exposure right implant S/p removal   Cellulitis with concern for NSTI of right breast   POD1 s/p I&D Dr. Dwain Sarna  - leave packing in place today but changed outer dressings, will plan to remove packing tomorrow  - ok to have diet today  - will determine need for return to OR tomorrow but hopefully can avoid - WBC normalized, afebrile, Cx pending  - hgb 11.4 from 11.7, continue to monitor   A.fib with RVR - appreciate cards help, switching from amio gtt to amio PO today, HR in the low 100s  Hypokalemia - K 3.1 this AM, replace PO  FEN: reg diet, decreased IVF and then SLIV; PO replacement for K VTE: LMWH, would hold off on resuming  eliquis until sure no further OR - can start heparin gtt if needed  ID: Zosyn/linezolid 4/10>>  HTN HLD AAA s/p repair  Pre-diabetes COPD Anxiety and depression   LOS: 1 day   I reviewed Consultant ID, plastics, cardiology, TRH notes, last 24 h vitals and pain scores, last 48 h intake and output, last 24 h labs and trends, and last 24 h imaging results.    Ashley Robertson, [facility] 03/13/2024, 8:09 AM Please see Amion for pager number during day hours 7:00am-4:30pm

## 2024-03-13 NOTE — Plan of Care (Signed)

## 2024-03-14 DIAGNOSIS — L03313 Cellulitis of chest wall: Secondary | ICD-10-CM | POA: Diagnosis not present

## 2024-03-14 DIAGNOSIS — I480 Paroxysmal atrial fibrillation: Secondary | ICD-10-CM

## 2024-03-14 LAB — CBC
HCT: 30.7 % — ABNORMAL LOW (ref 36.0–46.0)
Hemoglobin: 10.2 g/dL — ABNORMAL LOW (ref 12.0–15.0)
MCH: 32.9 pg (ref 26.0–34.0)
MCHC: 33.2 g/dL (ref 30.0–36.0)
MCV: 99 fL (ref 80.0–100.0)
Platelets: 268 10*3/uL (ref 150–400)
RBC: 3.1 MIL/uL — ABNORMAL LOW (ref 3.87–5.11)
RDW: 13.6 % (ref 11.5–15.5)
WBC: 7.9 10*3/uL (ref 4.0–10.5)
nRBC: 0 % (ref 0.0–0.2)

## 2024-03-14 LAB — BASIC METABOLIC PANEL WITH GFR
Anion gap: 11 (ref 5–15)
BUN: 14 mg/dL (ref 8–23)
CO2: 27 mmol/L (ref 22–32)
Calcium: 8.4 mg/dL — ABNORMAL LOW (ref 8.9–10.3)
Chloride: 96 mmol/L — ABNORMAL LOW (ref 98–111)
Creatinine, Ser: 0.65 mg/dL (ref 0.44–1.00)
GFR, Estimated: >60 mL/min (ref >60)
Glucose, Bld: 121 mg/dL — ABNORMAL HIGH (ref 70–99)
Potassium: 4 mmol/L (ref 3.5–5.1)
Sodium: 134 mmol/L — ABNORMAL LOW (ref 135–145)

## 2024-03-14 LAB — APTT
aPTT: 54 s — ABNORMAL HIGH (ref 24–36)
aPTT: 68 s — ABNORMAL HIGH (ref 24–36)
aPTT: 35 s (ref 24–36)

## 2024-03-14 LAB — HEPARIN LEVEL (UNFRACTIONATED): Heparin Unfractionated: 0.62 [IU]/mL (ref 0.30–0.70)

## 2024-03-14 MED ORDER — APIXABAN 5 MG PO TABS
5.0000 mg | ORAL_TABLET | Freq: Two times a day (BID) | ORAL | Status: DC
  Administered 2024-03-14 – 2024-03-16 (×5): 5 mg via ORAL
  Filled 2024-03-14 (×5): qty 1

## 2024-03-14 NOTE — Progress Notes (Addendum)
 ADDENDUM 1255 Spoke with microbiology regarding update of tissue right chest culture. The debridement tissue was not plated for culture. The specimen is still present in lab and culture will be started today.   Plastic Surgery POD#2 debridement right chest wall POD#7 removal right chest implant  Temp:  [97.8 F (36.6 C)-98.3 F (36.8 C)] 98 F (36.7 C) (04/12 0759) Pulse Rate:  [50-90] 50 (04/12 0759) Resp:  [16-18] 16 (04/12 0759) BP: (91-135)/(54-84) 135/79 (04/12 0759) SpO2:  [95 %-98 %] 98 % (04/12 0759)    PE  Chest: moist to dry dressing in place, examined small portion base which demonstrates early granulation Adjacent skin erythema is still present, no further progression and lighter in color  A/P S/p debridement chest wall for presumed NSTI  Culture from initial removal implant 03/07/2024 MSSA- was on Bactrim at time readmission  Two cultures sent from 03/12/2024 operative debridement, one represents soft tissue/debridement specimen sent for culture, one represents swab culture implant cavity.  Specimen labeled wound right chest represents swab culture: GS with rare WBC, no organisms, no growth culture 2 days  Specimen labeled tissue right chest represents soft tissue culture: GS with few WBC, no organisms, culture pending.  Continues on Zyvox/Zosyn.   Pathology punch biopsy skin from my office 4.10.2025:   SKIN, RIGHT BREAST, BIOPSY:               EPIDERMAL ULCERATION WITH ACUTE INFLAMMATION (SEE COMMENT)  Electronically signed by Marthann Slade, MD on 03/13/2024 at 1542 EDT  Comment   There is an ulceration with mild hyperkeratosis and acanthosis of the adjacent epidermis. In the underlying dermis, there is a dense inflammatory infiltrate with neutrophils admixed with lymphocytes and histiocytes. Special stains for PAS, AFB, and Gram were performed. These stains are negative. Controls worked appropriately.  This may represent a chronic wound, an infectious  process, or pyoderma gangrenosum. Correlation with clinical presentation and culture results is recommended.       [name], MD [facility] Plastic & Reconstructive Surgery  Office/ physician access line after hours [phone]

## 2024-03-14 NOTE — Progress Notes (Signed)
 Progress Note  2 Days Post-Op  Subjective: WBC 8. She is AF and HDS. Denies complaints this morning.   Objective: Vital signs in last 24 hours: Temp:  [97.8 F (36.6 C)-98.3 F (36.8 C)] 98 F (36.7 C) (04/12 0759) Pulse Rate:  [50-90] 50 (04/12 0759) Resp:  [16-18] 16 (04/12 0759) BP: (91-135)/(54-84) 135/79 (04/12 0759) SpO2:  [95 %-98 %] 98 % (04/12 0759) Last BM Date : 03/12/24  Intake/Output from previous day: 04/11 0701 - 04/12 0700 In: 590 [P.O.:240; IV Piggyback:350] Out: -  Intake/Output this shift: No intake/output data recorded.  PE: General: pleasant, WD, thin female, up in chair, NAD Heart: sinus tachycardia in the low 100s on my exam Lungs/chest: Respiratory effort nonlabored, right chest wound with a clean and healthy base, minimal eschar to the superior and medial skin edges, minimal bleeding, minimal drainage Abd: soft, NT, ND MS: moving BUEs Psych: A&Ox3 with an appropriate affect.    Lab Results:  Recent Labs    03/13/24 0538 03/14/24 0725  WBC 9.2 7.9  HGB 11.4* 10.2*  HCT 33.2* 30.7*  PLT 222 268   BMET Recent Labs    03/13/24 0538 03/14/24 0725  NA 134* 134*  K 3.1* 4.0  CL 95* 96*  CO2 29 27  GLUCOSE 205* 121*  BUN 8 14  CREATININE 0.66 0.65  CALCIUM 8.2* 8.4*   PT/INR No results for input(s): "LABPROT", "INR" in the last 72 hours. CMP     Component Value Date/Time   NA 134 (L) 03/14/2024 0725   NA 135 07/24/2021 0853   K 4.0 03/14/2024 0725   CL 96 (L) 03/14/2024 0725   CO2 27 03/14/2024 0725   GLUCOSE 121 (H) 03/14/2024 0725   BUN 14 03/14/2024 0725   BUN 12 07/24/2021 0853   CREATININE 0.65 03/14/2024 0725   CREATININE 0.70 02/11/2024 0931   CALCIUM 8.4 (L) 03/14/2024 0725   PROT 5.9 (L) 03/12/2024 1602   PROT 7.0 08/03/2020 0832   ALBUMIN 2.2 (L) 03/12/2024 1602   ALBUMIN 4.1 08/03/2020 0832   AST 21 03/12/2024 1602   AST 32 02/11/2024 0931   ALT 15 03/12/2024 1602   ALT 19 02/11/2024 0931   ALKPHOS 115  03/12/2024 1602   BILITOT 0.9 03/12/2024 1602   BILITOT 0.4 02/11/2024 0931   GFRNONAA >60 03/14/2024 0725   GFRNONAA >60 02/11/2024 0931   GFRAA 107 11/03/2020 1206   Lipase  No results found for: "LIPASE"     Studies/Results: CT CHEST W CONTRAST Result Date: 03/13/2024 CLINICAL DATA:  History of right breast cancer, metastatic to lymph nodes, with cellulitis of right breast reconstruction. * Tracking Code: BO * EXAM: CT CHEST WITH CONTRAST TECHNIQUE: Multidetector CT imaging of the chest was performed during intravenous contrast administration. RADIATION DOSE REDUCTION: This exam was performed according to the departmental dose-optimization program which includes automated exposure control, adjustment of the mA and/or kV according to patient size and/or use of iterative reconstruction technique. CONTRAST:  50mL OMNIPAQUE IOHEXOL 350 MG/ML SOLN COMPARISON:  Chest radiograph dated 01/12/2023, CT chest dated 03/05/2022 FINDINGS: Cardiovascular: Normal heart size. No significant pericardial fluid/thickening. Great vessels are normal in course and caliber. No central pulmonary emboli. Coronary artery calcifications and aortic atherosclerosis. Mediastinum/Nodes: Imaged thyroid gland without nodules meeting criteria for imaging follow-up by size. Normal esophagus. Prominent subcentimeter left axillary and supraclavicular lymph nodes measuring up to 9 mm (3:29). Lungs/Pleura: The central airways are patent. Right lower lobe subsegmental mucous plugging. Right apical irregular  consolidation and right anterior upper lobe linear scarring, architectural distortion, and traction bronchiectasis, likely post radiation changes. Left lower lobe linear atelectasis/scarring and subpleural right lower lobe atelectasis distal to the mucous plugging. 5 x 4 mm anterior lingular nodule (4:95), unchanged. No pneumothorax. No pleural effusion. Upper abdomen: Normal. Musculoskeletal: Multiple healing left rib fractures  involving the anterolateral 6th through 8th and posterior tenth ribs. Right axillary surgical clips. Right lateral approach drainage catheter within the right mastectomy site, which demonstrates irregular soft tissue thickening. No discrete fluid collection. Small focus of subcutaneous emphysema adjacent to the catheter. IMPRESSION: 1. Right lateral approach drainage catheter within the right mastectomy site, which demonstrates irregular soft tissue thickening in keeping with reported cellulitis. No discrete fluid collection. Small focus of subcutaneous emphysema adjacent to the catheter. 2. Right apical irregular consolidation and right anterior upper lobe linear scarring, architectural distortion, and traction bronchiectasis, likely post radiation changes. 3. Multiple healing left rib fractures involving the anterolateral 6th through 8th and posterior tenth ribs. 4. Prominent subcentimeter left axillary and supraclavicular lymph nodes measuring up to 9 mm, nonspecific. 5. Aortic Atherosclerosis (ICD10-I70.0). Coronary artery calcifications. Assessment for potential risk factor modification, dietary therapy or pharmacologic therapy may be warranted, if clinically indicated. Electronically Signed   By: Limin  Xu M.D.   On: 03/13/2024 09:26    Anti-infectives: Anti-infectives (From admission, onward)    Start     Dose/Rate Route Frequency Ordered Stop   03/12/24 1800  piperacillin-tazobactam (ZOSYN) IVPB 3.375 g        3.375 g 12.5 mL/hr over 240 Minutes Intravenous Every 8 hours 03/12/24 1559     03/12/24 1700  linezolid (ZYVOX) IVPB 600 mg        600 mg 300 mL/hr over 60 Minutes Intravenous Every 12 hours 03/12/24 1555     03/12/24 1600  ceFAZolin (ANCEF) IVPB 2g/100 mL premix  Status:  Discontinued        2 g 200 mL/hr over 30 Minutes Intravenous Every 8 hours 03/12/24 1508 03/12/24 1554        Assessment/Plan Right breast cancer overlapping sites ER+ metastatic to LN Neoadjuvant  chemotherapy S/p right SRM, ALND, prepectoral TE/ADM (Alloderm) reconstruction Adjuvant RT S/p removal port, right silicone implant exchange, left mastopexy Drainage, exposure right implant S/p removal   Cellulitis with concern for NSTI of right breast   POD2 s/p I&D Dr. Delane Fear  - dressing changed at bedside.  Kerlix WTD BID for now. Will plan to wound vac.  - Continue IV abx for now. Will transition to PO at DC. Cx pending   A.fib with RVR - appreciate cards help, switching from amio gtt to amio PO today, HR in the low 100s  Hypokalemia - K 3.1 this AM, replace PO  FEN: reg diet, decreased IVF and then SLIV; PO replacement for K VTE: LMWH, would hold off on resuming eliquis until sure no further OR - can start heparin gtt if needed  ID: Zosyn/linezolid 4/10>>  HTN HLD AAA s/p repair  Pre-diabetes COPD Anxiety and depression   LOS: 2 days   I reviewed Consultant ID, plastics, cardiology, TRH notes, last 24 h vitals and pain scores, last 48 h intake and output, last 24 h labs and trends, and last 24 h imaging results.    Cannon Champion, MD [facility] Surgery 03/14/2024, 9:19 AM Please see Amion for pager number during day hours 7:00am-4:30pm

## 2024-03-14 NOTE — Progress Notes (Signed)
 PHARMACY - ANTICOAGULATION CONSULT NOTE  Pharmacy Consult for heparin  Indication: atrial fibrillation  Allergies  Allergen Reactions   Zestril [Lisinopril] Other (See Comments) and Cough    Hyperkalemia    Lipitor [Atorvastatin] Other (See Comments)    Myalgias    Patient Measurements:    Vital Signs: Temp: 98 F (36.7 C) (04/12 0759) Temp Source: Oral (04/12 0759) BP: 135/79 (04/12 0759) Pulse Rate: 50 (04/12 0759)  Labs: Recent Labs    03/12/24 1602 03/13/24 0538 03/13/24 1553 03/14/24 0023 03/14/24 0725  HGB 11.7* 11.4*  --   --  10.2*  HCT 33.9* 33.2*  --   --  30.7*  PLT 231 222  --   --  268  APTT  --   --  52* 54* 68*  HEPARINUNFRC  --   --  >1.10* 0.62  --   CREATININE 0.61 0.66  --   --  0.65    Estimated Creatinine Clearance: 69 mL/min (by C-G formula based on SCr of 0.65 mg/dL).  Assessment: Pt admitted for cellulitis of right breast reconstruction, taken to OR overnight for concern of necrotizing soft tissue infection. Now developed peri-op afib w/ RVR in setting on known hx of Afib. Pt on Eliquis PTA last dose 4/10 AM s/p Kcentra on 4/10. HgB 11.4 and PLTs 222. Pharmacy consulted to dose heparin gtt.   Heparin level is elevated (0.62) this am likely d/t recent apixaban, aPTT was subtherapeutic at 54 this AM but 68 (therapeutic) on repeat at 0725 , on 1400 units/hr. No s/sx of bleeding or infusion issues per RN.   Goal of Therapy:  Heparin level 0.3-0.7 units/ml aPTT 66-102 sec Monitor platelets by anticoagulation protocol: Yes   Plan:  Increase heparin infusion to 1450 units/hr  Confirmatory aPTT in 6 hours  Check anti-Xa level daily while on heparin Continue to monitor H&H and platelets  Omir Cooprider A. Brynn Caras, PharmD, BCPS, FNKF Clinical Pharmacist [place] Please utilize Amion for appropriate phone number to reach the unit pharmacist [facility])  03/14/2024 9:52 AM

## 2024-03-14 NOTE — Progress Notes (Signed)
 PHARMACY - ANTICOAGULATION CONSULT NOTE  Pharmacy Consult for heparin  Indication: atrial fibrillation  Allergies  Allergen Reactions   Zestril [Lisinopril] Other (See Comments) and Cough    Hyperkalemia    Lipitor [Atorvastatin] Other (See Comments)    Myalgias    Patient Measurements:    Vital Signs: Temp: 97.9 F (36.6 C) (04/11 2345) Temp Source: Oral (04/11 2345) BP: 93/54 (04/11 2345) Pulse Rate: 59 (04/11 2345)  Labs: Recent Labs    03/12/24 1602 03/13/24 0538 03/13/24 1553 03/14/24 0023  HGB 11.7* 11.4*  --   --   HCT 33.9* 33.2*  --   --   PLT 231 222  --   --   APTT  --   --  52* 54*  HEPARINUNFRC  --   --  >1.10* 0.62  CREATININE 0.61 0.66  --   --     Estimated Creatinine Clearance: 69 mL/min (by C-G formula based on SCr of 0.66 mg/dL).  Assessment: Pt admitted for cellulitis of right breast reconstruction, taken to OR overnight for concern of necrotizing soft tissue infection. Now developed peri-op afib w/ RVR in setting on known hx of Afib. Pt on Eliquis PTA last dose 4/10 AM s/p Kcentra on 4/10. HgB 11.4 and PLTs 222. Pharmacy consulted to dose heparin gtt.   Heparin level is elevated (0.62) likely d/t recent apixaban, aPTT is subtherapeutic at 54, on 1150 units/hr. No s/sx of bleeding or infusion issues per RN.   Goal of Therapy:  Heparin level 0.3-0.7 units/ml Monitor platelets by anticoagulation protocol: Yes   Plan:  Increase heparin infusion to 1400 units/hr  aPTT in 6 hours  Check anti-Xa level daily while on heparin Continue to monitor H&H and platelets  Thank you for allowing pharmacy to participate in this patient's care,  Young Hensen, PharmD. Clinical Pharmacist 03/14/2024 1:28 AM

## 2024-03-14 NOTE — Plan of Care (Signed)

## 2024-03-14 NOTE — Progress Notes (Signed)
 Rounding Note    Patient Name: Ashley Robertson Date of Encounter: 03/14/2024  [facility] Cardiologist: Randene Bustard, MD    Subjective    68 yo with hx of breast  PAF , COPD , breast cancer , s/p postmastectomy , right breast cellulitis reconstruction  . She was loaded with IV amio. Has been transitioned to PO amio She converted to NSR this am   Feels well      Inpatient Medications    Scheduled Meds:  amiodarone  400 mg Oral Q12H   Followed by   Cecily Cohen ON 03/21/2024] amiodarone  200 mg Oral Daily   apixaban  5 mg Oral BID   budeson-glycopyrrolate-formoterol  2 puff Inhalation BID   carvedilol  6.25 mg Oral BID   ezetimibe  10 mg Oral Daily   FLUoxetine  20 mg Oral Daily   rosuvastatin  10 mg Oral Q M,W,F   And   rosuvastatin  5 mg Oral Once per day on Sunday Tuesday Thursday Saturday   Continuous Infusions:  linezolid (ZYVOX) IV 600 mg (03/14/24 0437)   piperacillin-tazobactam (ZOSYN)  IV 3.375 g (03/14/24 1405)   PRN Meds: acetaminophen, albuterol, methocarbamol, [DISCONTINUED] ondansetron **OR** ondansetron (ZOFRAN) IV, oxyCODONE   Vital Signs    Vitals:   03/13/24 2345 03/14/24 0254 03/14/24 0759 03/14/24 1226  BP: (!) 93/54 (!) 91/59 135/79 118/86  Pulse: (!) 59 (!) 58 (!) 50 60  Resp: 16 16 16 18   Temp: 97.9 F (36.6 C) 97.8 F (36.6 C) 98 F (36.7 C) 98 F (36.7 C)  TempSrc: Oral Oral Oral Oral  SpO2: 96% 95% 98% 97%    Intake/Output Summary (Last 24 hours) at 03/14/2024 1530 Last data filed at 03/13/2024 2141 Gross per 24 hour  Intake 540 ml  Output --  Net 540 ml      03/10/2024    8:47 AM 03/07/2024    2:09 PM 03/07/2024    8:48 AM  Last 3 Weights  Weight (lbs) 143 lb 141 lb 6.4 oz 144 lb  Weight (kg) 64.864 kg 64.139 kg 65.318 kg      Telemetry    NSR  - Personally Reviewed  ECG     - Personally Reviewed  Physical Exam   GEN: No acute distress.   Neck: No JVD Cardiac: RRR, no murmurs, rubs, or gallops.   Respiratory: Clear to auscultation bilaterally. GI: Soft, nontender, non-distended  MS: No edema; No deformity. Neuro:  Nonfocal  Psych: Normal affect   Labs    High Sensitivity Troponin:  No results for input(s): "TROPONINIHS" in the last 720 hours.   Chemistry Recent Labs  Lab 03/08/24 0417 03/12/24 1602 03/13/24 0538 03/14/24 0725  NA 142 133* 134* 134*  K 4.8 2.9* 3.1* 4.0  CL 102 92* 95* 96*  CO2 30 27 29 27   GLUCOSE 166* 120* 205* 121*  BUN 15 9 8 14   CREATININE 0.73 0.61 0.66 0.65  CALCIUM 8.5* 8.1* 8.2* 8.4*  MG 2.3  --   --   --   PROT 6.3* 5.9*  --   --   ALBUMIN 2.5* 2.2*  --   --   AST 18 21  --   --   ALT 15 15  --   --   ALKPHOS 99 115  --   --   BILITOT 0.6 0.9  --   --   GFRNONAA >60 >60 >60 >60  ANIONGAP 10 14 10  11  Lipids No results for input(s): "CHOL", "TRIG", "HDL", "LABVLDL", "LDLCALC", "CHOLHDL" in the last 168 hours.  Hematology Recent Labs  Lab 03/12/24 1602 03/13/24 0538 03/14/24 0725  WBC 17.6* 9.2 7.9  RBC 3.54* 3.45* 3.10*  HGB 11.7* 11.4* 10.2*  HCT 33.9* 33.2* 30.7*  MCV 95.8 96.2 99.0  MCH 33.1 33.0 32.9  MCHC 34.5 34.3 33.2  RDW 13.2 13.4 13.6  PLT 231 222 268   Thyroid  Recent Labs  Lab 03/07/24 2025  TSH 0.574  FREET4 1.34*    BNPNo results for input(s): "BNP", "PROBNP" in the last 168 hours.  DDimer No results for input(s): "DDIMER" in the last 168 hours.   Radiology    CT CHEST W CONTRAST Result Date: 03/13/2024 CLINICAL DATA:  History of right breast cancer, metastatic to lymph nodes, with cellulitis of right breast reconstruction. * Tracking Code: BO * EXAM: CT CHEST WITH CONTRAST TECHNIQUE: Multidetector CT imaging of the chest was performed during intravenous contrast administration. RADIATION DOSE REDUCTION: This exam was performed according to the departmental dose-optimization program which includes automated exposure control, adjustment of the mA and/or kV according to patient size and/or use of  iterative reconstruction technique. CONTRAST:  50mL OMNIPAQUE IOHEXOL 350 MG/ML SOLN COMPARISON:  Chest radiograph dated 01/12/2023, CT chest dated 03/05/2022 FINDINGS: Cardiovascular: Normal heart size. No significant pericardial fluid/thickening. Great vessels are normal in course and caliber. No central pulmonary emboli. Coronary artery calcifications and aortic atherosclerosis. Mediastinum/Nodes: Imaged thyroid gland without nodules meeting criteria for imaging follow-up by size. Normal esophagus. Prominent subcentimeter left axillary and supraclavicular lymph nodes measuring up to 9 mm (3:29). Lungs/Pleura: The central airways are patent. Right lower lobe subsegmental mucous plugging. Right apical irregular consolidation and right anterior upper lobe linear scarring, architectural distortion, and traction bronchiectasis, likely post radiation changes. Left lower lobe linear atelectasis/scarring and subpleural right lower lobe atelectasis distal to the mucous plugging. 5 x 4 mm anterior lingular nodule (4:95), unchanged. No pneumothorax. No pleural effusion. Upper abdomen: Normal. Musculoskeletal: Multiple healing left rib fractures involving the anterolateral 6th through 8th and posterior tenth ribs. Right axillary surgical clips. Right lateral approach drainage catheter within the right mastectomy site, which demonstrates irregular soft tissue thickening. No discrete fluid collection. Small focus of subcutaneous emphysema adjacent to the catheter. IMPRESSION: 1. Right lateral approach drainage catheter within the right mastectomy site, which demonstrates irregular soft tissue thickening in keeping with reported cellulitis. No discrete fluid collection. Small focus of subcutaneous emphysema adjacent to the catheter. 2. Right apical irregular consolidation and right anterior upper lobe linear scarring, architectural distortion, and traction bronchiectasis, likely post radiation changes. 3. Multiple healing left  rib fractures involving the anterolateral 6th through 8th and posterior tenth ribs. 4. Prominent subcentimeter left axillary and supraclavicular lymph nodes measuring up to 9 mm, nonspecific. 5. Aortic Atherosclerosis (ICD10-I70.0). Coronary artery calcifications. Assessment for potential risk factor modification, dietary therapy or pharmacologic therapy may be warranted, if clinically indicated. Electronically Signed   By: Limin  Xu M.D.   On: 03/13/2024 09:26    Cardiac Studies     Patient Profile     68 y.o. female    Assessment & Plan     1. PAF :  she developed atrial fib associated with her chest cellulitis ( following breast reconstruction)  She had a previous episodes of PAF associated with her AAA repair several years ago   At this point , she is very stable She is on PO Amiodarone Her dosing schedule is in  the orders  She will take 400 po BID for 7 days and then 200 mg a day Continue eliquis 5 mg BID .  She will follow up with Dr. Amanda Jungling   We will arrange follow up   [facility] will sign off.   Medication Recommendations:   continue PO amio as scheduled in the order set ,   cont eliquis 5 mg  po BID  Other recommendations (labs, testing, etc):   Follow up as an outpatient:  with Dr.  Myrtie Atkinson - we will arrange      For questions or updates, please contact [facility] Please consult [email or web address] for contact info under        Signed, Ahmad Alert, MD  03/14/2024, 3:30 PM

## 2024-03-15 NOTE — Progress Notes (Signed)
 Plastic Surgery POD#3 debridement right chest wall POD#8 removal right chest implant  Temp:  [97.8 F (36.6 C)-98.1 F (36.7 C)] 98 F (36.7 C) (04/13 0759) Pulse Rate:  [54-62] 62 (04/13 0759) Resp:  [16-19] 16 (04/13 0759) BP: (118-168)/(72-117) 168/117 (04/13 0759) SpO2:  [94 %-97 %] 97 % (04/13 0759)    PE  Chest: moist to dry dressing in place, dressing change done earlier this am Adjacent skin erythema is significantly improved, induration noted by patient superior extent wound  A/P S/p debridement chest wall for presumed NSTI  Culture from initial removal implant 03/07/2024 MSSA- was on Bactrim at time readmission  Two cultures sent from 03/12/2024 operative debridement, one represents soft tissue/debridement specimen sent for culture, one represents swab culture implant cavity.  Specimen labeled wound right chest represents swab culture: GS with rare WBC, no organisms, no growth culture 2 days  Specimen labeled tissue right chest represents soft tissue culture: GS with few WBC, no organisms, no growth culture 3 d  Continues on Zyvox/Zosyn.       [name], MD [facility] Plastic & Reconstructive Surgery  Office/ physician access line after hours [phone]

## 2024-03-15 NOTE — Progress Notes (Signed)
 Progress Note  3 Days Post-Op  Subjective: Remains AF and HDS.  Overall, doing well.   Objective: Vital signs in last 24 hours: Temp:  [97.8 F (36.6 C)-98.1 F (36.7 C)] 98 F (36.7 C) (04/13 0759) Pulse Rate:  [54-60] 54 (04/13 0311) Resp:  [16-19] 16 (04/13 0759) BP: (118-168)/(72-117) 168/117 (04/13 0759) SpO2:  [94 %-97 %] 94 % (04/13 0311) Last BM Date : 03/12/24  Intake/Output from previous day: 04/12 0701 - 04/13 0700 In: 810 [P.O.:480; IV Piggyback:330] Out: -  Intake/Output this shift: No intake/output data recorded.  PE: General: pleasant, WD, thin female, up in chair, NAD Heart: sinus tachycardia in the low 100s on my exam Lungs/chest: Respiratory effort nonlabored, right chest wound with a clean and healthy base, minimal eschar to the superior and medial skin edges, minimal bleeding, minimal drainage Abd: soft, NT, ND MS: moving BUEs Psych: A&Ox3 with an appropriate affect.    Lab Results:  Recent Labs    03/13/24 0538 03/14/24 0725  WBC 9.2 7.9  HGB 11.4* 10.2*  HCT 33.2* 30.7*  PLT 222 268   BMET Recent Labs    03/13/24 0538 03/14/24 0725  NA 134* 134*  K 3.1* 4.0  CL 95* 96*  CO2 29 27  GLUCOSE 205* 121*  BUN 8 14  CREATININE 0.66 0.65  CALCIUM 8.2* 8.4*   PT/INR No results for input(s): "LABPROT", "INR" in the last 72 hours. CMP     Component Value Date/Time   NA 134 (L) 03/14/2024 0725   NA 135 07/24/2021 0853   K 4.0 03/14/2024 0725   CL 96 (L) 03/14/2024 0725   CO2 27 03/14/2024 0725   GLUCOSE 121 (H) 03/14/2024 0725   BUN 14 03/14/2024 0725   BUN 12 07/24/2021 0853   CREATININE 0.65 03/14/2024 0725   CREATININE 0.70 02/11/2024 0931   CALCIUM 8.4 (L) 03/14/2024 0725   PROT 5.9 (L) 03/12/2024 1602   PROT 7.0 08/03/2020 0832   ALBUMIN 2.2 (L) 03/12/2024 1602   ALBUMIN 4.1 08/03/2020 0832   AST 21 03/12/2024 1602   AST 32 02/11/2024 0931   ALT 15 03/12/2024 1602   ALT 19 02/11/2024 0931   ALKPHOS 115 03/12/2024  1602   BILITOT 0.9 03/12/2024 1602   BILITOT 0.4 02/11/2024 0931   GFRNONAA >60 03/14/2024 0725   GFRNONAA >60 02/11/2024 0931   GFRAA 107 11/03/2020 1206   Lipase  No results found for: "LIPASE"     Studies/Results: No results found.   Anti-infectives: Anti-infectives (From admission, onward)    Start     Dose/Rate Route Frequency Ordered Stop   03/12/24 1800  piperacillin-tazobactam (ZOSYN) IVPB 3.375 g        3.375 g 12.5 mL/hr over 240 Minutes Intravenous Every 8 hours 03/12/24 1559     03/12/24 1700  linezolid (ZYVOX) IVPB 600 mg        600 mg 300 mL/hr over 60 Minutes Intravenous Every 12 hours 03/12/24 1555     03/12/24 1600  ceFAZolin (ANCEF) IVPB 2g/100 mL premix  Status:  Discontinued        2 g 200 mL/hr over 30 Minutes Intravenous Every 8 hours 03/12/24 1508 03/12/24 1554        Assessment/Plan Right breast cancer overlapping sites ER+ metastatic to LN Neoadjuvant chemotherapy S/p right SRM, ALND, prepectoral TE/ADM (Alloderm) reconstruction Adjuvant RT S/p removal port, right silicone implant exchange, left mastopexy Drainage, exposure right implant S/p removal   Cellulitis with concern for  NSTI of right breast   POD3 s/p I&D Dr. Delane Fear  - dressing changed at bedside.  Kerlix WTD BID for now. Will plan to wound vac, consult placed.  - Continue IV abx for now. Will transition to PO at DC. Cx pending   A.fib with RVR - appreciate cards help, loaded with IV amio and now on PO. Converted to NSR 4/12. Cards s/o. Eliquis restarted 4/12  Hypokalemia - repleted.   FEN: reg diet VTE: Eliquis ID: Zosyn/linezolid 4/10>>  HTN HLD AAA s/p repair  Pre-diabetes COPD Anxiety and depression   LOS: 3 days   I reviewed Consultant ID, plastics, cardiology, TRH notes, last 24 h vitals and pain scores, last 48 h intake and output, last 24 h labs and trends, and last 24 h imaging results.    Cannon Champion, MD [facility] Surgery 03/15/2024, 8:03  AM Please see Amion for pager number during day hours 7:00am-4:30pm

## 2024-03-15 NOTE — Plan of Care (Signed)

## 2024-03-15 NOTE — TOC Progression Note (Addendum)
 Transition of Care [facility]) - Progression Note    Patient Details  Name: Ashley Robertson MRN: [id number] Date of Birth: Sep 06, 1956  Transition of Care [facility]) CM/SW Contact  Jannine Meo, RN Phone Number: 03/15/2024, 11:25 AM  Clinical Narrative:   Message from provider that patient needs wound vac. Referral to [facility] with [employer], awaiting response.  1128: Tracy with [employer] able to supply wound vac. She will send order via email to Provider.   1137: HH RN arranged through [place] with [place].          Expected Discharge Plan and Services                                               Social Determinants of Health (SDOH) Interventions SDOH Screenings   Food Insecurity: No Food Insecurity (03/12/2024)  Housing: Low Risk  (03/12/2024)  Transportation Needs: No Transportation Needs (03/12/2024)  Utilities: Not At Risk (03/12/2024)  Financial Resource Strain: Low Risk  (02/21/2022)  Social Connections: Moderately Isolated (03/12/2024)  Tobacco Use: Medium Risk (03/12/2024)   Received from [facility]    Readmission Risk Interventions     No data to display

## 2024-03-16 ENCOUNTER — Other Ambulatory Visit (HOSPITAL_COMMUNITY): Payer: Self-pay

## 2024-03-16 DIAGNOSIS — T8579XD Infection and inflammatory reaction due to other internal prosthetic devices, implants and grafts, subsequent encounter: Secondary | ICD-10-CM

## 2024-03-16 DIAGNOSIS — A4901 Methicillin susceptible Staphylococcus aureus infection, unspecified site: Secondary | ICD-10-CM

## 2024-03-16 DIAGNOSIS — L03313 Cellulitis of chest wall: Secondary | ICD-10-CM | POA: Diagnosis not present

## 2024-03-16 DIAGNOSIS — C50811 Malignant neoplasm of overlapping sites of right female breast: Secondary | ICD-10-CM | POA: Diagnosis not present

## 2024-03-16 DIAGNOSIS — T8579XA Infection and inflammatory reaction due to other internal prosthetic devices, implants and grafts, initial encounter: Secondary | ICD-10-CM

## 2024-03-16 MED ORDER — AMOXICILLIN-POT CLAVULANATE 875-125 MG PO TABS
1.0000 | ORAL_TABLET | Freq: Two times a day (BID) | ORAL | Status: DC
  Administered 2024-03-16: 1 via ORAL
  Filled 2024-03-16 (×2): qty 1

## 2024-03-16 MED ORDER — AMOXICILLIN-POT CLAVULANATE 875-125 MG PO TABS
1.0000 | ORAL_TABLET | Freq: Two times a day (BID) | ORAL | 0 refills | Status: AC
  Filled 2024-03-16: qty 20, 10d supply, fill #0

## 2024-03-16 MED ORDER — AMIODARONE HCL 200 MG PO TABS
ORAL_TABLET | ORAL | 0 refills | Status: DC
  Filled 2024-03-16: qty 50, 35d supply, fill #0

## 2024-03-16 MED ORDER — ACETAMINOPHEN 325 MG PO TABS: 650.0000 mg | ORAL_TABLET | Freq: Four times a day (QID) | ORAL | Status: AC | PRN

## 2024-03-16 MED ORDER — OXYCODONE HCL 5 MG PO TABS
5.0000 mg | ORAL_TABLET | Freq: Four times a day (QID) | ORAL | 0 refills | Status: DC | PRN
  Filled 2024-03-16: qty 15, 2d supply, fill #0

## 2024-03-16 NOTE — Progress Notes (Addendum)
 Pt discharge education and AVS reviewed with pt. Pt verbalized understanding, and her questions were answered. PIV removed without complication. Telemetry removed and CCMD called. Pt placed on the Home health portable wound vac with no complications. Pt educated on how to charge wound vac, and how to wear portable wound vac machine. TOC medications given to pt. Pt instructed to answer phone calls from [facility] agency, for further appointment setup. AVS and discharge papers handed to pt. Pt ambulatory off unit with NT and all personal belongings. Pt's husband to transport pt home.

## 2024-03-16 NOTE — TOC Transition Note (Addendum)
 Transition of Care (TOC) - Discharge Note Sherin Dingwall RN,BSN Transitions of Care Unit 4NP (Non Trauma)- RN Case Manager See Treatment Team for direct Phone #   Patient Details  Name: Ashley Robertson MRN: [id number] Date of Birth: August 26, 1956  Transition of Care [facility]) CM/SW Contact:  Rox Cope, RN Phone Number: 03/16/2024, 4:27 PM   Clinical Narrative:    Follow up done this am for home wound VAC needs- signed order completed w/ [facility] and approval has been given for home VAC.  POD emailed per liaison to [facility] for delivery of Ready VAC.   CM has delivered home VAC to the bedside along with supplies. Signed POD faxed back to [facility] liaison. Discussed HH arrangements w/ pt- per previous CM note- HH set up with [facility]- pt voiced she has no preference and is agreeable w/ [facility]  CM has confirmed [facility] RN w/ Gasper Karst- per [facility]CS note from today- wound VAC drsg plans for M/TH (2x week) confirmed this w/ M. Maczis. [facility] liaison updated.   Pt aware that Gasper Karst will call to schedule visit for thur 4/17 for next [facility] drsg.   Family to transport home.   Final next level of care: Home w Home Health Services Barriers to Discharge: Barriers Resolved   Patient Goals and CMS Choice Patient states their goals for this hospitalization and ongoing recovery are:: return home   Choice offered to / list presented to : Patient      Discharge Placement                 Home w/ [facility]      Discharge Plan and Services Additional resources added to the After Visit Summary for     Discharge Planning Services: CM Consult Post Acute Care Choice: Durable Medical Equipment, Home Health          DME Arranged: Negative pressure wound device DME Agency: [employer] Date DME Agency Contacted: 03/15/24 Time DME Agency Contacted: 1125 Representative spoke with at DME Agency: Sherrlyn Dolores HH Arranged: RN HH Agency: [facility] Date [facility] Agency Contacted: 03/15/24 Time HH Agency Contacted: 1137 Representative  spoke with at [facility] Agency: Randel Buss  Social Drivers of Health (SDOH) Interventions SDOH Screenings   Food Insecurity: No Food Insecurity (03/12/2024)  Housing: Low Risk  (03/12/2024)  Transportation Needs: No Transportation Needs (03/12/2024)  Utilities: Not At Risk (03/12/2024)  Financial Resource Strain: Low Risk  (02/21/2022)  Social Connections: Moderately Isolated (03/12/2024)  Tobacco Use: Medium Risk (03/12/2024)   Received from [facility]     Readmission Risk Interventions    03/16/2024    4:27 PM  Readmission Risk Prevention Plan  Transportation Screening Complete  Home Care Screening Complete  Medication Review (RN CM) Complete

## 2024-03-16 NOTE — Progress Notes (Signed)
 Plastic Surgery POD#4 debridement right chest wall POD#9 removal right chest implant  Temp:  [97.7 F (36.5 C)-98.6 F (37 C)] 98.2 F (36.8 C) (04/14 1158) Pulse Rate:  [54-58] 57 (04/14 0805) Resp:  [14-18] 15 (04/14 0342) BP: (125-169)/(63-96) 152/96 (04/14 1158) SpO2:  [93 %-97 %] 97 % (04/14 0805)    PE  Chest: VAC placed this am  A/P S/p debridement chest wall for presumed NSTI  Culture from initial removal implant 03/07/2024 MSSA- was on Bactrim at time readmission  Two cultures sent from 03/12/2024 operative debridement, one represents soft tissue/debridement specimen sent for culture, one represents swab culture implant cavity.  Specimen labeled wound right chest represents swab culture: GS with rare WBC, no organisms, no anaerobes, no growth culture 4 days  Specimen labeled tissue right chest represents soft tissue culture: GS with few WBC, no organisms, no anaerobes, no growth culture 4 d  ID has recommended Augmentin   Pathology pending.   Will arrange OP follow up with me once [facility] scheduled obtained.   [name], MD [facility] Plastic & Reconstructive Surgery  Office/ physician access line after hours [phone]

## 2024-03-16 NOTE — Progress Notes (Signed)
 4 Days Post-Op  Subjective: CC: R chest wall pain controlled. No other complaints.  Tolerating diet. Mobilizing. Voiding.   Objective: Vital signs in last 24 hours: Temp:  [97.7 F (36.5 C)-98.6 F (37 C)] 98.4 F (36.9 C) (04/14 0805) Pulse Rate:  [54-58] 57 (04/14 0805) Resp:  [14-18] 15 (04/14 0342) BP: (125-182)/(63-96) 169/94 (04/14 0805) SpO2:  [93 %-98 %] 97 % (04/14 0805) Last BM Date : 03/12/24  Intake/Output from previous day: 04/13 0701 - 04/14 0700 In: 1769.8 [IV Piggyback:1769.8] Out: -  Intake/Output this shift: No intake/output data recorded.  PE: Gen:  Alert, NAD, pleasant Pulm:  Rate and effort normal R chest wall: As below. Base clean. No undermining of skin edges. No drainage. Some erythema and induration of wound edges but no crepitus or fluctuance.    Lab Results:  Recent Labs    03/14/24 0725  WBC 7.9  HGB 10.2*  HCT 30.7*  PLT 268   BMET Recent Labs    03/14/24 0725  NA 134*  K 4.0  CL 96*  CO2 27  GLUCOSE 121*  BUN 14  CREATININE 0.65  CALCIUM 8.4*   PT/INR No results for input(s): "LABPROT", "INR" in the last 72 hours. CMP     Component Value Date/Time   NA 134 (L) 03/14/2024 0725   NA 135 07/24/2021 0853   K 4.0 03/14/2024 0725   CL 96 (L) 03/14/2024 0725   CO2 27 03/14/2024 0725   GLUCOSE 121 (H) 03/14/2024 0725   BUN 14 03/14/2024 0725   BUN 12 07/24/2021 0853   CREATININE 0.65 03/14/2024 0725   CREATININE 0.70 02/11/2024 0931   CALCIUM 8.4 (L) 03/14/2024 0725   PROT 5.9 (L) 03/12/2024 1602   PROT 7.0 08/03/2020 0832   ALBUMIN 2.2 (L) 03/12/2024 1602   ALBUMIN 4.1 08/03/2020 0832   AST 21 03/12/2024 1602   AST 32 02/11/2024 0931   ALT 15 03/12/2024 1602   ALT 19 02/11/2024 0931   ALKPHOS 115 03/12/2024 1602   BILITOT 0.9 03/12/2024 1602   BILITOT 0.4 02/11/2024 0931   GFRNONAA >60 03/14/2024 0725   GFRNONAA >60 02/11/2024 0931   GFRAA 107 11/03/2020 1206   Lipase  No results found for:  "LIPASE"  Studies/Results: No results found.  Anti-infectives: Anti-infectives (From admission, onward)    Start     Dose/Rate Route Frequency Ordered Stop   03/12/24 1800  piperacillin-tazobactam (ZOSYN) IVPB 3.375 g        3.375 g 12.5 mL/hr over 240 Minutes Intravenous Every 8 hours 03/12/24 1559     03/12/24 1700  linezolid (ZYVOX) IVPB 600 mg        600 mg 300 mL/hr over 60 Minutes Intravenous Every 12 hours 03/12/24 1555     03/12/24 1600  ceFAZolin (ANCEF) IVPB 2g/100 mL premix  Status:  Discontinued        2 g 200 mL/hr over 30 Minutes Intravenous Every 8 hours 03/12/24 1508 03/12/24 1554        Assessment/Plan POD 4 s/p Debridement of 13 x 14 x 1 cm area of the right chest wall including skin and subcutaneous tissue by Dr. Dwain Sarna on 4/10 for NSTI of R chest wall  - Wound vac getting placed today. Plan M/Th changes. CM/SW working on getting [facility] RN and Home vac arranged.  - Cont abx per ID. Bcx and intra-op cx NGTD. Checking with them today if needs PICC for IV abx at d/c or can switch to  PO abx - Once HH, Home vac and abx are arranged, plan d/c.  FEN - Reg VTE - SCDs, Eliquis ID - Linezolid/Zosyn. Afebrile. WBC wnl. Bcx and intra-op cx NGTD.  I reviewed nursing notes, last 24 h vitals and pain scores, last 48 h intake and output, last 24 h labs and trends, and last 24 h imaging results.   LOS: 4 days    Ashley Robertson, [facility] 03/16/2024, 9:06 AM Please see Amion for pager number during day hours 7:00am-4:30pm

## 2024-03-16 NOTE — Progress Notes (Signed)
 Subjective: No new complaints   Antibiotics:  Anti-infectives (From admission, onward)    Start     Dose/Rate Route Frequency Ordered Stop   03/16/24 1145  amoxicillin-clavulanate (AUGMENTIN) 875-125 MG per tablet 1 tablet        1 tablet Oral Every 12 hours 03/16/24 1056 03/27/24 0959   03/12/24 1800  piperacillin-tazobactam (ZOSYN) IVPB 3.375 g  Status:  Discontinued        3.375 g 12.5 mL/hr over 240 Minutes Intravenous Every 8 hours 03/12/24 1559 03/16/24 1056   03/12/24 1700  linezolid (ZYVOX) IVPB 600 mg  Status:  Discontinued        600 mg 300 mL/hr over 60 Minutes Intravenous Every 12 hours 03/12/24 1555 03/16/24 1056   03/12/24 1600  ceFAZolin (ANCEF) IVPB 2g/100 mL premix  Status:  Discontinued        2 g 200 mL/hr over 30 Minutes Intravenous Every 8 hours 03/12/24 1508 03/12/24 1554       Medications: Scheduled Meds:  amiodarone  400 mg Oral Q12H   Followed by   Melene Muller ON 03/21/2024] amiodarone  200 mg Oral Daily   amoxicillin-clavulanate  1 tablet Oral Q12H   apixaban  5 mg Oral BID   budeson-glycopyrrolate-formoterol  2 puff Inhalation BID   carvedilol  6.25 mg Oral BID   ezetimibe  10 mg Oral Daily   FLUoxetine  20 mg Oral Daily   rosuvastatin  10 mg Oral Q M,W,F   And   rosuvastatin  5 mg Oral Once per day on Sunday Tuesday Thursday Saturday   Continuous Infusions: PRN Meds:.acetaminophen, albuterol, methocarbamol, [DISCONTINUED] ondansetron **OR** ondansetron (ZOFRAN) IV, oxyCODONE    Objective: Weight change:   Intake/Output Summary (Last 24 hours) at 03/16/2024 1058 Last data filed at 03/16/2024 0427 Gross per 24 hour  Intake 1769.81 ml  Output --  Net 1769.81 ml   Blood pressure (!) 169/94, pulse (!) 57, temperature 98.4 F (36.9 C), temperature source Oral, resp. rate 15, SpO2 97%. Temp:  [97.7 F (36.5 C)-98.6 F (37 C)] 98.4 F (36.9 C) (04/14 0805) Pulse Rate:  [54-58] 57 (04/14 0805) Resp:  [14-18] 15 (04/14 0342) BP:  (125-182)/(63-96) 169/94 (04/14 0805) SpO2:  [93 %-98 %] 97 % (04/14 0805)  Physical Exam: Physical Exam Constitutional:      General: She is not in acute distress.    Appearance: She is well-developed. She is not diaphoretic.  HENT:     Head: Normocephalic and atraumatic.     Right Ear: External ear normal.     Left Ear: External ear normal.     Mouth/Throat:     Pharynx: No oropharyngeal exudate.  Eyes:     General: No scleral icterus.    Conjunctiva/sclera: Conjunctivae normal.     Pupils: Pupils are equal, round, and reactive to light.  Cardiovascular:     Rate and Rhythm: Normal rate and regular rhythm.  Pulmonary:     Effort: Pulmonary effort is normal. No respiratory distress.     Breath sounds: Normal breath sounds. No wheezing.  Abdominal:     General: Bowel sounds are normal. There is no distension.     Palpations: Abdomen is soft.     Tenderness: There is no abdominal tenderness. There is no rebound.  Musculoskeletal:        General: No tenderness. Normal range of motion.  Lymphadenopathy:     Cervical: No cervical adenopathy.  Skin:    General:  Skin is warm and dry.     Coloration: Skin is not pale.     Findings: No erythema or rash.  Neurological:     General: No focal deficit present.     Mental Status: She is alert and oriented to person, place, and time.     Motor: No abnormal muscle tone.     Coordination: Coordination normal.  Psychiatric:        Mood and Affect: Mood normal.        Behavior: Behavior normal.        Thought Content: Thought content normal.        Judgment: Judgment normal.     Wound vacuum dressing in place  CBC:    BMET Recent Labs    03/14/24 0725  NA 134*  K 4.0  CL 96*  CO2 27  GLUCOSE 121*  BUN 14  CREATININE 0.65  CALCIUM 8.4*     Liver Panel  No results for input(s): "PROT", "ALBUMIN", "AST", "ALT", "ALKPHOS", "BILITOT", "BILIDIR", "IBILI" in the last 72 hours.     Sedimentation Rate No results for  input(s): "ESRSEDRATE" in the last 72 hours. C-Reactive Protein No results for input(s): "CRP" in the last 72 hours.  Micro Results: Recent Results (from the past 720 hours)  Aerobic/Anaerobic Culture w Gram Stain (surgical/deep wound)     Status: None   Collection Time: 03/07/24 11:35 AM   Specimen: Wound; Abscess  Result Value Ref Range Status   Specimen Description WOUND  Final   Special Requests RIGHT CHEST WALL  Final   Gram Stain   Final    MODERATE WBC PRESENT, PREDOMINANTLY PMN MODERATE GRAM POSITIVE COCCI    Culture   Final    FEW STAPHYLOCOCCUS AUREUS NO ANAEROBES ISOLATED Performed at [facility], [address], [place], [place] [address]    Report Status 03/12/2024 FINAL  Final   Organism ID, Bacteria STAPHYLOCOCCUS AUREUS  Final      Susceptibility   Staphylococcus aureus - MIC*    CIPROFLOXACIN <=0.5 SENSITIVE Sensitive     ERYTHROMYCIN <=0.25 SENSITIVE Sensitive     GENTAMICIN <=0.5 SENSITIVE Sensitive     OXACILLIN 0.5 SENSITIVE Sensitive     TETRACYCLINE >=16 RESISTANT Resistant     VANCOMYCIN 1 SENSITIVE Sensitive     TRIMETH/SULFA <=10 SENSITIVE Sensitive     CLINDAMYCIN <=0.25 SENSITIVE Sensitive     RIFAMPIN <=0.5 SENSITIVE Sensitive     Inducible Clindamycin NEGATIVE Sensitive     LINEZOLID 2 SENSITIVE Sensitive     * FEW STAPHYLOCOCCUS AUREUS  Culture, blood (Routine X 2) w Reflex to ID Panel     Status: None (Preliminary result)   Collection Time: 03/12/24  4:02 PM   Specimen: BLOOD RIGHT HAND  Result Value Ref Range Status   Specimen Description BLOOD RIGHT HAND  Final   Special Requests   Final    BOTTLES DRAWN AEROBIC AND ANAEROBIC Blood Culture results may not be optimal due to an inadequate volume of blood received in culture bottles   Culture   Final    NO GROWTH 4 DAYS Performed at [facility], [address] [place], [place] [address]    Report Status PENDING  Incomplete  Culture, blood (Routine X 2) w Reflex to ID Panel      Status: None (Preliminary result)   Collection Time: 03/12/24  4:03 PM   Specimen: BLOOD LEFT HAND  Result Value Ref Range Status   Specimen  Description BLOOD LEFT HAND  Final   Special Requests   Final    BOTTLES DRAWN AEROBIC AND ANAEROBIC Blood Culture results may not be optimal due to an inadequate volume of blood received in culture bottles   Culture   Final    NO GROWTH 4 DAYS Performed at [facility], [address] [place], [place] [address]    Report Status PENDING  Incomplete  Aerobic/Anaerobic Culture w Gram Stain (surgical/deep wound)     Status: None (Preliminary result)   Collection Time: 03/12/24  8:05 PM   Specimen: Soft Tissue, Other  Result Value Ref Range Status   Specimen Description TISSUE  Final   Special Requests RT CHEST WALL  Final   Gram Stain   Final    FEW WBC PRESENT,BOTH PMN AND MONONUCLEAR NO ORGANISMS SEEN    Culture   Final    NO GROWTH 3 DAYS NO ANAEROBES ISOLATED; CULTURE IN PROGRESS FOR 5 DAYS Performed at [facility], [address], [place], [place] [address]    Report Status PENDING  Incomplete  Aerobic/Anaerobic Culture w Gram Stain (surgical/deep wound)     Status: None (Preliminary result)   Collection Time: 03/12/24  8:05 PM   Specimen: Wound  Result Value Ref Range Status   Specimen Description WOUND RIGHT CHEST  Final   Special Requests RT CHEST WALL  Final   Gram Stain   Final    RARE WBC PRESENT, PREDOMINANTLY MONONUCLEAR NO ORGANISMS SEEN    Culture   Final    NO GROWTH 4 DAYS NO ANAEROBES ISOLATED; CULTURE IN PROGRESS FOR 5 DAYS Performed at [facility], [address], [place], [place] [address]    Report Status PENDING  Incomplete    Studies/Results: No results found.    Assessment/Plan:  INTERVAL HISTORY: Cultures unrevealing   Principal Problem:   Cellulitis of chest wall Active Problems:   Essential hypertension   Hyperlipidemia with target LDL less than 100   Malignant neoplasm  of overlapping sites of right breast in female, estrogen receptor positive (HCC)   Carotid atherosclerosis   Coronary artery disease   Recurrent major depression in remission (HCC)   S/P AAA repair   Atrial fibrillation (HCC)    Ashley Robertson is a 68 y.o. female with with past medical history significant for breast cancer status post surgery and neoadjuvant chemotherapy and 3 prior to admission left breast reduction and right breast implant exchange removed on 5 April due to concerns of infection.  Cultures at the time yielded MSSA.  He was on Bactrim after surgery but developed ecchymosis modic necrotic changes concerning for necrotizing fasciitis.  She is admitted to hospital blood cultures were drawn she was broadened briefly to Zyvox and Zosyn prior to being taken to the operating room where she went I&D.  Intraoperative cultures have been unrevealing.  #1 Breast implant associated infection:  Think we can largely focus on the MSSA which she had isolated previously but maybe include some anaerobic coverage by placing Augmentin with sufficient antibiotics to give her 2 weeks of postoperative antibiotic treatment.    I have personally spent 50 minutes involved in face-to-face and non-face-to-face activities for this patient on the day of the visit. Professional time spent includes the following activities: Preparing to see the patient (review of tests), Obtaining and/or reviewing separately obtained history (admission/discharge record), Performing a medically appropriate examination and/or evaluation , Ordering medications/tests/procedures, referring and communicating with other health care  professionals, Documenting clinical information in the EMR, Independently interpreting results (not separately reported), Communicating results to the patient/family/caregiver, Counseling and educating the patient/family/caregiver and Care coordination (not separately reported).   Evaluation of the  patient requires complex antimicrobial therapy evaluation, counseling , isolation needs to reduce disease transmission and risk assessment and mitigation.     LOS: 4 days   Sheela Denmark 03/16/2024, 10:58 AM

## 2024-03-16 NOTE — Consult Note (Addendum)
 WOC Nurse Consult Note: Pt is followed by the surgical team for assessment and plan of care.  Surgical PA at the bedside to assess wound appearance. Requested to apply Vac to right breast full thickness post-op wound. Location is clean without necrotic tissue but is pale and slightly yellow. [id number].  Pt denies need for pain meds.  Applied Mepitel contact layer to avoid adherence and applied one piece black foam. Pt tolerated with minimal amt discomfort.  Cont suction on at .  WOC team will plan to change again on Thurs if patient is still in the hospital at that time.  Thank-you,  Wiliam Harder MSN, RN, CWOCN, [place], CNS [phone]

## 2024-03-17 LAB — CULTURE, BLOOD (ROUTINE X 2)
Culture: NO GROWTH
Culture: NO GROWTH

## 2024-03-18 LAB — AEROBIC/ANAEROBIC CULTURE W GRAM STAIN (SURGICAL/DEEP WOUND): Culture: NO GROWTH

## 2024-03-19 DIAGNOSIS — T8579XA Infection and inflammatory reaction due to other internal prosthetic devices, implants and grafts, initial encounter: Secondary | ICD-10-CM | POA: Diagnosis not present

## 2024-03-19 DIAGNOSIS — B9561 Methicillin susceptible Staphylococcus aureus infection as the cause of diseases classified elsewhere: Secondary | ICD-10-CM | POA: Diagnosis not present

## 2024-03-19 DIAGNOSIS — Z87891 Personal history of nicotine dependence: Secondary | ICD-10-CM | POA: Diagnosis not present

## 2024-03-19 DIAGNOSIS — I4891 Unspecified atrial fibrillation: Secondary | ICD-10-CM | POA: Diagnosis not present

## 2024-03-19 DIAGNOSIS — Z7901 Long term (current) use of anticoagulants: Secondary | ICD-10-CM | POA: Diagnosis not present

## 2024-03-19 DIAGNOSIS — Z170 Estrogen receptor positive status [ER+]: Secondary | ICD-10-CM | POA: Diagnosis not present

## 2024-03-19 DIAGNOSIS — Z7951 Long term (current) use of inhaled steroids: Secondary | ICD-10-CM | POA: Diagnosis not present

## 2024-03-19 DIAGNOSIS — J449 Chronic obstructive pulmonary disease, unspecified: Secondary | ICD-10-CM | POA: Diagnosis not present

## 2024-03-19 DIAGNOSIS — I2510 Atherosclerotic heart disease of native coronary artery without angina pectoris: Secondary | ICD-10-CM | POA: Diagnosis not present

## 2024-03-19 DIAGNOSIS — R7303 Prediabetes: Secondary | ICD-10-CM | POA: Diagnosis not present

## 2024-03-19 DIAGNOSIS — Z9181 History of falling: Secondary | ICD-10-CM | POA: Diagnosis not present

## 2024-03-19 DIAGNOSIS — E785 Hyperlipidemia, unspecified: Secondary | ICD-10-CM | POA: Diagnosis not present

## 2024-03-19 DIAGNOSIS — L03313 Cellulitis of chest wall: Secondary | ICD-10-CM | POA: Diagnosis not present

## 2024-03-19 DIAGNOSIS — C50811 Malignant neoplasm of overlapping sites of right female breast: Secondary | ICD-10-CM | POA: Diagnosis not present

## 2024-03-19 DIAGNOSIS — I10 Essential (primary) hypertension: Secondary | ICD-10-CM | POA: Diagnosis not present

## 2024-03-19 DIAGNOSIS — F3340 Major depressive disorder, recurrent, in remission, unspecified: Secondary | ICD-10-CM | POA: Diagnosis not present

## 2024-03-19 DIAGNOSIS — Z7983 Long term (current) use of bisphosphonates: Secondary | ICD-10-CM | POA: Diagnosis not present

## 2024-03-20 DIAGNOSIS — Z9012 Acquired absence of left breast and nipple: Secondary | ICD-10-CM | POA: Diagnosis not present

## 2024-03-20 DIAGNOSIS — C50911 Malignant neoplasm of unspecified site of right female breast: Secondary | ICD-10-CM | POA: Diagnosis not present

## 2024-03-23 ENCOUNTER — Other Ambulatory Visit: Payer: Self-pay | Admitting: Cardiology

## 2024-03-23 ENCOUNTER — Other Ambulatory Visit: Payer: Self-pay

## 2024-03-23 ENCOUNTER — Other Ambulatory Visit (HOSPITAL_COMMUNITY): Payer: Self-pay

## 2024-03-23 LAB — SURGICAL PATHOLOGY

## 2024-03-23 NOTE — Progress Notes (Unsigned)
 Cardiology Office Note    Date:  03/30/2024  ID:  Ashley Robertson, DOB Aug 03, 1956, MRN [id number] PCP:  Ashley Lobstein, MD  Cardiologist:  Randene Bustard, MD  Electrophysiologist:  None   Chief Complaint: Follow up for atrial fibrillation   History of Present Illness: .    Ashley Robertson is a 68 y.o. female with visit-pertinent history of hypertension, hyperlipidemia, postop atrial fibrillation, CAD, carotid artery disease, emphysema, AAA s/p repair, breast cancer s/p post mastectomy, radiation, chemo, depression.  Patient previously followed by Dr. Addie Holstein for elevated CAC scoring that was less than 100.  Patient has history of AAA s/p repair in 12/2022.  In postop timeframe patient developed atrial fibrillation, was started on Eliquis  and amiodarone .  Patient denied any recurrence until recent hospitalizations for removal of the right breast implant, noted to be in atrial fibrillation and restarted on Eliquis .  On 03/12/2024 cardiology was consulted for evaluation of atrial fibrillation after presenting to the emergent department for worsening drainage from right breast reconstruction, concern for necrotizing soft tissue infection, she was found to be in atrial fibrillation on EKG.  She was started on broad-spectrum antibiotics and patient was taken to the OR for I&D.  Patient was started on IV amiodarone  and Eliquis  5 mg twice daily.  Patient converted back to sinus rhythm prior to discharge.  Today she presents for follow-up.  She reports that she is doing well overall.  She denies any chest pain, shortness of breath, lower extremity edema, orthopnea or PND.  She denies any palpitations or feeling of increased heart rates.  She reports that she has been tolerating amiodarone  and Eliquis  well.  Labwork independently reviewed: 03/24/2024: Sodium 138, potassium 4.5, creatinine 0.70, AST 24, ALT 19, hemoglobin 1.5, hematocrit 34.7 ROS: .   Today she denies chest pain, shortness of breath, lower  extremity edema, fatigue, palpitations, melena, hematuria, hemoptysis, diaphoresis, weakness, presyncope, syncope, orthopnea, and PND.  All other systems are reviewed and otherwise negative. Studies Reviewed: Aaron Aas   EKG:  EKG is ordered today, personally reviewed, demonstrating  EKG Interpretation Date/Time:  Monday March 30 2024 15:44:02 EDT Ventricular Rate:  91 PR Interval:    QRS Duration:  96 QT Interval:  398 QTC Calculation: 489 R Axis:   29  Text Interpretation: Atrial fibrillation When compared with ECG of 12-Mar-2024 16:08, T wave inversion no longer evident in Inferior leads Nonspecific T wave abnormality no longer evident in Lateral leads Confirmed by Lamyah Creed [phone]) on 03/30/2024 5:28:55 PM   CV Studies: Cardiac studies reviewed are outlined and summarized above. Otherwise please see EMR for full report. Cardiac Studies & Procedures   ______________________________________________________________________________________________     ECHOCARDIOGRAM  ECHOCARDIOGRAM LIMITED 03/04/2024  Narrative ECHOCARDIOGRAM LIMITED REPORT    Patient Name:   Ashley Robertson Date of Exam: 03/04/2024 Medical Rec #:  [id number]       Height:       69.0 in Accession #:    [id number]      Weight:       145.3 lb Date of Birth:  Apr 21, 1956      BSA:          1.804 m Patient Age:    67 years        BP:           140/70 mmHg Patient Gender: F               HR:  60 bpm. Exam Location:  Inpatient  Procedure: Limited Echo, Cardiac Doppler, Limited Color Doppler and Strain Analysis (Both Spectral and Color Flow Doppler were utilized during procedure).  Indications:    Chemo  History:        Patient has prior history of Echocardiogram examinations, most recent 11/18/2023. CAD; Risk Factors:Hypertension.  Sonographer:    Amy Chionchio Referring Phys: [id number] MARY E BRANCH  IMPRESSIONS   1. Left ventricular ejection fraction, by estimation, is 50 to 55%. The left ventricle has low  normal function. The left ventricle has no regional wall motion abnormalities. 2. Right ventricular systolic function is normal. The right ventricular size is normal. 3. The aortic valve was not well visualized. Aortic valve regurgitation is not visualized. Mild aortic valve stenosis. Vmax 2.5 m/s, MG , AVA 1.5 cm^2, DI 0.52  FINDINGS Left Ventricle: Left ventricular ejection fraction, by estimation, is 50 to 55%. The left ventricle has low normal function. The left ventricle has no regional wall motion abnormalities. Global longitudinal strain performed but not reported based on interpreter judgement due to suboptimal tracking.  Right Ventricle: The right ventricular size is normal. No increase in right ventricular wall thickness. Right ventricular systolic function is normal.  Pericardium: There is no evidence of pericardial effusion.  Aortic Valve: The aortic valve was not well visualized. Aortic valve regurgitation is not visualized. Mild aortic stenosis is present. Aortic valve mean gradient measures 10.5 mmHg. Aortic valve peak gradient measures 20.4 mmHg. Aortic valve area, by VTI measures 1.55 cm.  LEFT VENTRICLE PLAX 2D LVOT diam:     1.90 cm LV SV:         64 LV SV Index:   35 LVOT Area:     2.84 cm  LV Volumes (MOD) LV vol d, MOD A2C: 91.1 ml LV vol d, MOD A4C: 82.9 ml LV vol s, MOD A2C: 35.7 ml LV vol s, MOD A4C: 48.9 ml LV SV MOD A2C:     55.4 ml LV SV MOD A4C:     82.9 ml LV SV MOD BP:      42.8 ml  RIGHT VENTRICLE RV Basal diam:  3.40 cm TAPSE (M-mode): 2.0 cm  AORTIC VALVE AV Area (Vmax):    1.46 cm AV Area (Vmean):   1.42 cm AV Area (VTI):     1.55 cm AV Vmax:           226.00 cm/s AV Vmean:          148.500 cm/s AV VTI:            0.412 m AV Peak Grad:      20.4 mmHg AV Mean Grad:      10.5 mmHg LVOT Vmax:         116.00 cm/s LVOT Vmean:        74.600 cm/s LVOT VTI:          0.225 m LVOT/AV VTI ratio: 0.55   SHUNTS Systemic VTI:  0.22  m Systemic Diam: 1.90 cm  Carson Clara MD Electronically signed by Carson Clara MD Signature Date/Time: 03/04/2024/3:48:43 PM    Final      CT SCANS  CT CARDIAC SCORING (SELF PAY ONLY) 03/24/2020  Addendum 03/24/2020 11:53 AM ADDENDUM REPORT: 03/24/2020 11:50  CLINICAL DATA:  Risk stratification  EXAM: Coronary Calcium  Score  TECHNIQUE: The patient was scanned on a Siemens Somatom 64 slice scanner. Axial non-contrast 3 mm slices were carried out through the heart. The data set was analyzed on  a dedicated work station and scored using the [employer] .  FINDINGS: Non-cardiac: See separate report from [facility].  Ascending aorta: Some calcific atherosclerosis normal diameter 3.5 cm  Pericardium: Normal  Coronary arteries: Calcium  noted in LAD and circumflex  IMPRESSION: Coronary calcium  score of 79. This was 52 th percentile for age and sex matched control.  Janelle Mediate   Electronically Signed By: Janelle Mediate M.D. On: 03/24/2020 11:50  Narrative EXAM: OVER-READ INTERPRETATION  CT CHEST  The following report is an over-read performed by radiologist Dr.  [facility] on 03/24/2020. This over-read does not include interpretation of cardiac or coronary anatomy or pathology. The coronary calcium  score interpretation by the cardiologist is attached.  COMPARISON:  CT 03/09/2020  FINDINGS: Limited view of the lung parenchyma demonstrates 5 mm pulmonary nodule in the LEFT upper lobe (image 5/3). Airways are normal.  Limited view of the mediastinum demonstrates no adenopathy. Esophagus normal.  Limited view of the upper abdomen unremarkable.  Limited view of the skeleton and chest wall is unremarkable.  IMPRESSION: LEFT upper lobe pulmonary nodule. Refer to recommendation from low-dose lung cancer screening exam 03/09/2000.  Electronically Signed: By: Deboraha Fallow M.D. On: 03/24/2020 09:28      ______________________________________________________________________________________________       Current Reported Medications:.    Current Meds  Medication Sig   acetaminophen  (TYLENOL ) 325 MG tablet Take 2 tablets (650 mg total) by mouth every 6 (six) hours as needed for mild pain (pain score 1-3), fever or headache.   albuterol  (PROVENTIL  HFA) 108 (90 Base) MCG/ACT inhaler Inhale 1 to 2 puffs into the lungs every 6 hours as needed   alendronate  (FOSAMAX ) 70 MG tablet Take 1 tablet (70 mg total) by mouth once a week. Take with plain water 30 minutes before the first food, beverage, or medicine of the day. (Patient taking differently: Take 70 mg by mouth every Sunday. Take with plain water 30 minutes before the first food, beverage, or medicine of the day.)   amiodarone  (PACERONE ) 200 MG tablet Take 2 tablets (400 mg total) by mouth every 12 (twelve) hours for 5 days, THEN 1 tablet (200 mg total) daily.   amLODipine  (NORVASC ) 10 MG tablet Take 1 tablet (10 mg total) by mouth at bedtime.   anastrozole  (ARIMIDEX ) 1 MG tablet Take 1 tablet (1 mg) by mouth daily. (Patient taking differently: Take 1 mg by mouth at bedtime.)   apixaban  (ELIQUIS ) 5 MG TABS tablet Take 1 tablet (5 mg total) by mouth 2 (two) times daily.   Calcium  Carb-Cholecalciferol  (CALCIUM  + VITAMIN D3 PO) Take 1 tablet by mouth at bedtime.   carvedilol  (COREG ) 6.25 MG tablet Take 1 tablet (6.25 mg total) by mouth 2 (two) times daily.   Cholecalciferol  (VITAMIN D -3 PO) Take 1 tablet by mouth at bedtime.   ezetimibe  (ZETIA ) 10 MG tablet Take 1 tablet (10 mg total) by mouth daily. (Patient taking differently: Take 10 mg by mouth at bedtime.)   FLUoxetine  (PROZAC ) 20 MG capsule Take 1 capsule (20 mg total) by mouth daily. (Patient taking differently: Take 20 mg by mouth at bedtime.)   Fluticasone -Umeclidin-Vilant (TRELEGY ELLIPTA ) 200-62.5-25 MCG/ACT AEPB Inhale 1 puff into the lungs daily.   loratadine (CLARITIN) 10 MG tablet  Take 10 mg by mouth daily as needed for allergies.   [Paused] [name]  Maleate (NERLYNX ) 40 MG tablet Take 6 tablets (240 mg total) by mouth daily. Take with food.   oxyCODONE  (OXY IR/ROXICODONE ) 5 MG immediate release tablet  Take 1-2 tablets (5-10 mg total) by mouth every 6 (six) hours as needed for breakthrough pain.   Prenatal Vit-Fe Fumarate-FA (PRENATAL PO) Take 1 tablet by mouth at bedtime.   rosuvastatin  (CRESTOR ) 5 MG tablet Take 1 tablet by mouth daily at bedtime ,except on Monday, Wednesday and Fridays take 2 tablets at bedtime as directed   sulfamethoxazole -trimethoprim  (BACTRIM  DS) 800-160 MG tablet Take 1 tablet by mouth 2 (two) times daily.    Physical Exam:    VS:  BP 120/86   Pulse 100   Ht 5\' 9"  (1.753 m)   Wt 144 lb (65.3 kg)   SpO2 97%   BMI 21.27 kg/m    Wt Readings from Last 3 Encounters:  03/30/24 144 lb (65.3 kg)  03/24/24 145 lb 8 oz (66 kg)  03/10/24 143 lb (64.9 kg)    GEN: Well nourished, well developed in no acute distress NECK: No JVD; No carotid bruits CARDIAC: Irregular RR, no murmurs, rubs, gallops RESPIRATORY:  Clear to auscultation without rales, wheezing or rhonchi  ABDOMEN: Soft, non-tender, non-distended EXTREMITIES:  No edema; No acute deformity     Asessement and Plan:.    Persistent atrial fibrillation: Following AAA repair in 2024 she developed postop A-fib, was started on Eliquis  and had chemical conversion with amiodarone .  In 02/2024 the patient was admitted after having a right chest implant that was complicated by erythema and drainage, underwent removal of implant, during hospital stay was noted of gone into atrial fibrillation and restarted on Eliquis .  Patient recently readmitted on 03/12/24, was found to be in atrial fibrillation, restarted on IV amiodarone  and Eliquis .  Patient currently wearing cardiac monitor.  EKG today indicates atrial fibrillation at 91 bpm, patient cardiac unaware.  She denies any palpitations or feelings of  increased shortness of breath.  She reports adherence to amiodarone , she is currently wearing her cardiac monitor.  Discussed cardioversion, patient deferred at this time.  Discussed referral to A-fib clinic, patient in agreement pending monitor results.  Continue Eliquis  5 mg twice daily and amiodarone  200 mg daily. CHA2DS2-VASc Score = 3 [CHF History: 0, HTN History: 1, Diabetes History: 0, Stroke History: 0, Vascular Disease History: 0, Age Score: 1, Gender Score: 1].  Therefore, the patient's annual risk of stroke is 3.2 %.      Cardio oncology: On Her2 since 03/2022 was changed to Kadcyla  in 08/2023.  Patient was started on carvedilol  in 12/2022. Patient planned for neratinib  until August of 2025.  Repeat echocardiogram for monitoring ordered for later this year.  HTN: Blood pressure today 120/86.  Continue current antihypertensive regimen.  Elevated CAC: Patient noted to have coronary calcium  score of 79 in 2021, this was 88 percentile for age and sex matched control.  Patient denies any chest pain or increased shortness of breath.  Continue Eliquis .  Infrarenal AA: Patient followed by vascular surgery, increase in size up to 5.7 cm in 12/2022.  Now s/p open repair on 01/11/2023.   Disposition: F/u with Stanley Helmuth, NP in 6 weeks or sooner pending monitor results.   Signed, Cachet Mccutchen D Kouper Spinella, NP

## 2024-03-23 NOTE — Progress Notes (Unsigned)
 Patient Care Team: Helyn Lobstein, MD as PCP - General (Family Medicine) Arleen Lacer, MD as PCP - Cardiology (Cardiology) Auther Bo, RN as Oncology Nurse Navigator Alane Hsu, RN as Oncology Nurse Navigator Enid Harry, MD as Consulting Physician (General Surgery) Sonja Charlotte Hall, MD as Consulting Physician (Hematology) Johna Myers, MD as Consulting Physician (Radiation Oncology)   CHIEF COMPLAINT:   Oncology History Overview Note   Cancer Staging  Malignant neoplasm of overlapping sites of right breast in female, estrogen receptor positive [facility]) Staging form: Breast, AJCC 8th Edition - Clinical stage from 02/09/2022: Stage IIA (cT2, cN1, cM0, G2, ER+, PR-, HER2+) - Signed by Sonja Grant, MD on 02/20/2022    Malignant neoplasm of overlapping sites of right breast in female, estrogen receptor positive (HCC)  02/08/2022 Mammogram   CLINICAL DATA:  Acute onset right breast lump.  EXAM: DIGITAL DIAGNOSTIC UNILATERAL RIGHT MAMMOGRAM WITH TOMOSYNTHESIS AND CAD; ULTRASOUND RIGHT BREAST LIMITED  IMPRESSION: 1. Highly suspicious findings mammographically and sonographically. I suspect there is significant malignancy throughout most of the right breast. Discrete masses are seen at 11 o'clock and 6 o'clock.  A single borderline lymph node is identified. This lymph node appears prominent compared to the remainder of the lymph nodes. The skin thickening suggests the possibility of inflammatory breast cancer.   02/09/2022 Cancer Staging   Staging form: Breast, AJCC 8th Edition - Clinical stage from 02/09/2022: Stage IIIA (cT3, cN1, cM0, G2, ER+, PR-, HER2+) - Signed by Sonja [place], MD on 03/05/2022 Stage prefix: Initial diagnosis Histologic grading system: 3 grade system   02/09/2022 Initial Biopsy   Diagnosis 1. Breast, right, needle core biopsy, right breast 11 o'clock mass ribbon clip - INVASIVE MAMMARY CARCINOMA - SEE COMMENT 2. Breast, right, needle core biopsy, right breast  6'oclock mass, coil clip - INVASIVE MAMMARY CARCINOMA - SEE COMMENT 3. Lymph node, needle/core biopsy, right axillary lymph node, tribell clip - METASTATIC CARCINOMA INVOLVING NODAL TISSUE Microscopic Comment 1. and 2. The biopsy material shows an infiltrative proliferation of cells arranged linearly and in small clusters. Based on the biopsy, the carcinoma appears Nottingham grade 2 of 3 and measures 1.5 cm in greatest linear extent.  Addendum parts 1 and 2: Immunohistochemistry for E-cadherin is negative consistent with lobular carcinoma.  2. PROGNOSTIC INDICATORS Results: The tumor cells are EQUIVOCAL for Her2 (2+). Her2 by FISH will be performed and the results reported separately. Estrogen Receptor: 100%, POSITIVE, STRONG STAINING INTENSITY Progesterone Receptor: <1%, NEGATIVE Proliferation Marker Ki67: 10%  2. FLUORESCENCE IN-SITU HYBRIDIZATION Results: GROUP 1: HER2 **POSITIVE**   3. PROGNOSTIC INDICATORS Results: By immunohistochemistry, the tumor cells are POSITIVE for Her2 (3+). Estrogen Receptor: 100%, POSITIVE, STRONG STAINING INTENSITY Progesterone Receptor: <1%, NEGATIVE   02/16/2022 Initial Diagnosis   Malignant neoplasm of overlapping sites of right breast in female, estrogen receptor positive (HCC)   03/01/2022 Imaging   EXAM: BILATERAL BREAST MRI WITH AND WITHOUT CONTRAST  IMPRESSION: 1. 8.5 x 8.3 x 5.9 cm area of confluent mass-like enhancement in the right breast involving all 4 quadrants and containing 2 biopsy marker clip artifacts, compatible with 4 quadrant biopsy-proven malignancy. 2. Biopsy-proven metastatic lymph node in the right axilla. 3. Possible metastatic intramammary lymph node in the posterior outer right breast just above the level of the nipple. 4. No evidence of malignancy on the left.   03/01/2022 Genetic Testing   Negative hereditary cancer genetic testing: no pathogenic variants detected in [employer] CustomNext-Cancer +RNAinsight Panel.   Variant of uncertain significance  reported in BRIP1 at p.F934V (c.2800T>G). Report date is March 01, 2022.    The CustomNext-Cancer+RNAinsight panel offered by [employer] includes sequencing and rearrangement analysis for the following 47 genes:  APC, ATM, AXIN2, BARD1, BMPR1A, BRCA1, BRCA2, BRIP1, CDH1, CDK4, CDKN2A, CHEK2, DICER1, EPCAM, GREM1, HOXB13, MEN1, MLH1, MSH2, MSH3, MSH6, MUTYH, NBN, NF1, NF2, NTHL1, PALB2, PMS2, POLD1, POLE, PTEN, RAD51C, RAD51D, RECQL, RET, SDHA, SDHAF2, SDHB, SDHC, SDHD, SMAD4, SMARCA4, STK11, TP53, TSC1, TSC2, and VHL.  RNA data is routinely analyzed for use in variant interpretation for all genes.  UPDATE: BRIP1 p.F934V VUS has been amended to Likely Benign. Amended report date is 11/06/2022.    03/05/2022 Imaging   EXAM: CT CHEST, ABDOMEN, AND PELVIS WITH CONTRAST  IMPRESSION: 1. Asymmetric, masslike density of the glandular tissue of the right breast with overlying skin thickening, in keeping with known primary breast malignancy. 2. Enlarged, ill-defined right axillary lymph node containing a biopsy marking clip, consistent with known nodal metastatic disease. 3. No other evidence of lymphadenopathy or metastatic disease in the chest, abdomen, or pelvis. 4. Emphysema. Background of fine centrilobular nodularity, most concentrated in the lung apices, consistent with smoking-related respiratory bronchiolitis. 5. Aneurysm of the infrarenal abdominal aorta measuring up to 5.4 x 5.3 cm with a large burden of eccentric mural thrombus. Recommend follow-up CT/MR every 6 months and vascular consultation. This recommendation follows ACR consensus guidelines: White Paper of the ACR Incidental Findings Committee II on Vascular Findings. J Am Coll Radiol 2013; 10:789-794. 6. Coronary artery disease.   03/05/2022 Imaging   EXAM: NUCLEAR MEDICINE WHOLE BODY BONE SCAN  IMPRESSION: No evidence of bony metastatic disease.   03/07/2022 - 08/01/2022 Chemotherapy   Patient  is on Treatment Plan : BREAST  Docetaxel  + Carboplatin  + Trastuzumab  + Pertuzumab   (TCHP) q21d      07/26/2022 Surgery   Right mastectomy with axillary node seed guided excision converted to axillary node dissection with reconstruction by Drs. Delane Fear and Thimmappa   07/26/2022 Pathology Results   FINAL MICROSCOPIC DIAGNOSIS:   A. LYMPH NODE, RIGHT AXILLARY TARGETED, EXCISION:  - Metastatic carcinoma involving one lymph node, 2 cm (1/1).  - Focal extranodal extension.  - Biopsy site and biopsy clip.   B. BREAST, RIGHT, MASTECTOMY:  - Invasive lobular carcinoma, 9.5 cm (ypT3).  - Carcinoma involves dermis of nipple.  - All surgical margins negative for carcinoma.  - One lymph node negative for metastatic carcinoma (0/1).  - Biopsy sites and biopsy clips.  - See oncology table.   C. LYMPH NODE, RIGHT AXILLARY, SENTINEL, EXCISION:  - One lymph node negative for metastatic carcinoma (0/1).   D. LYMPH NODE, RIGHT AXILLARY, SENTINEL, EXCISION:  - Metastatic carcinoma in one lymph node, 0.5 cm. (1/1).   E. LYMPH NODE, RIGHT AXILLARY, SENTINEL, EXCISION:  - One lymph node negative for metastatic carcinoma (0/1).    07/26/2022 Cancer Staging   Cancer Staging  Malignant neoplasm of overlapping sites of right breast in female, estrogen receptor positive (HCC) Staging form: Breast, AJCC 8th Edition - Clinical stage from 02/09/2022: Stage IIIA (cT3, cN1, cM0, G2, ER+, PR-, HER2+) - Signed by Sonja Hardin, MD on 03/05/2022 Stage prefix: Initial diagnosis Histologic grading system: 3 grade system - Pathologic stage from 07/26/2022: Stage IIIA (pT3, pN1a, cM0, G2, ER+, PR-, HER2+) - Signed by Bettie Capistran K, NP on 08/01/2022 Histologic grading system: 3 grade system    07/26/2022 Cancer Staging   Staging form: Breast, AJCC 8th Edition - Pathologic stage from 07/26/2022:  Stage IIIA (pT3, pN1a, cM0, G2, ER+, PR-, HER2+) - Signed by Deyon Chizek K, NP on 08/01/2022 Histologic grading system: 3 grade  system   08/24/2022 -  Chemotherapy   Patient is on Treatment Plan : BREAST ADO-Trastuzumab Emtansine  (Kadcyla ) q21d     07/24/2023 Imaging   Echocardiogram  Normal LVF at 55-60 % Normal left diastolic function. Normal right ventricle  Normal visualized valves with preserved function      CURRENT THERAPY:   INTERVAL HISTORY   ROS   Past Medical History:  Diagnosis Date   AAA (abdominal aortic aneurysm) (HCC) 01/11/2023   Allergy    Lisinopril  and lipitor   Anxiety and depression    Aortic atherosclerosis (HCC) 03/2020   CT Chest: 2 V (LAD & LCx) Coronary Atherosclerosis, Aortic Atherosclerosis (no aneurysm).  Mild centrilobular emphysema with mild diffuse bronchial thickening; several scattered small solitary pulmonary nodules (largest 5.6 mm in anterior left upper lobe)   Breast cancer (HCC) 12/2021   Right breast ILC   Carotid artery plaque, bilateral 10/2015   Mild to moderate plaque L>R without significant stenosis   COPD (chronic obstructive pulmonary disease) (HCC)    Coronary Artery Calcification - Score 79    Coronary Calcium  Score 79.  LAD and circumflex calcification noted.  Normal ascending aorta with mild calcification.   Current every day smoker    pt quit smoking April 2023   Dysrhythmia 01/2023   developed post op a-fib after AAA repair, converted with amiodarone    Emphysema lung (HCC)    Noted on chest CT   Family history of breast cancer 02/21/2022   Family history of prostate cancer 02/21/2022   Hyperlipidemia    Hypertension    Controlled with amlodipine    Prediabetes    Skin cancer 2021   Remove 2022     Past Surgical History:  Procedure Laterality Date   ABDOMINAL AORTIC ANEURYSM REPAIR N/A 01/11/2023   Procedure: ANEURYSM ABDOMINAL AORTIC REPAIR;  Surgeon: Margherita Shell, MD;  Location: [facility] OR;  Service: Vascular;  Laterality: N/A;   BREAST BIOPSY Right    times 3   BREAST RECONSTRUCTION WITH PLACEMENT OF TISSUE EXPANDER AND ALLODERM Right  07/26/2022   Procedure: RIGHT BREAST RECONSTRUCTION WITH PLACEMENT OF TISSUE EXPANDER AND ALLODERM;  Surgeon: [name], MD;  Location: [facility] OR;  Service: Plastics;  Laterality: Right;   CESAREAN SECTION  1990, 1991   COSMETIC SURGERY  1981   Rhinoplasty   INCISION AND DRAINAGE OF WOUND Right 03/12/2024   Procedure: IRRIGATION AND DEBRIDEMENT WOUND;  Surgeon: Enid Harry, MD;  Location: [facility] OR;  Service: General;  Laterality: Right;  DEBRIDEMENT RIGHT CHEST WALL   MASTECTOMY Right    MASTECTOMY W/ SENTINEL NODE BIOPSY Right 07/26/2022   Procedure: RIGHT MASTECTOMY, RIGHT AXILLARY SENTINEL NODE BIOPSY;  Surgeon: Enid Harry, MD;  Location: [facility] OR;  Service: General;  Laterality: Right;  GEN & PEC BLOCK   MASTOPEXY Left 12/20/2023   Procedure: MASTOPEXY;  Surgeon: [name], MD;  Location: [facility];  Service: Plastics;  Laterality: Left;   PORT-A-CATH REMOVAL Left 12/20/2023   Procedure: REMOVAL PORT-A-CATH;  Surgeon: [name], MD;  Location: [facility];  Service: Plastics;  Laterality: Left;   PORTACATH PLACEMENT Left 03/06/2022   Procedure: INSERTION PORT-A-CATH;  Surgeon: Enid Harry, MD;  Location: [facility];  Service: General;  Laterality: Left;   RADIOACTIVE SEED GUIDED AXILLARY SENTINEL LYMPH NODE Right 07/26/2022   Procedure: RADIOACTIVE SEED GUIDED  AXILLARY SENTINEL LYMPH NODE DISSECTION;  Surgeon: Enid Harry, MD;  Location: [facility] OR;  Service: General;  Laterality: Right;   REMOVAL OF TISSUE EXPANDER AND PLACEMENT OF IMPLANT Right 12/20/2023   Procedure: REMOVAL OF TISSUE EXPANDER AND PLACEMENT OF SILICONE IMPLANT;  Surgeon: [name], MD;  Location: [facility];  Service: Plastics;  Laterality: Right;   REMOVAL OF TISSUE EXPANDER AND PLACEMENT OF IMPLANT Right 03/07/2024   Procedure: REMOVAL RIGHT CHEST IMPLANT;  Surgeon: [name], MD;  Location: [facility] OR;  Service:  Plastics;  Laterality: Right;  REMOVAL OF IMPLANT   RHINOPLASTY  1981   TONSILLECTOMY  1978   TRANSTHORACIC ECHOCARDIOGRAM  10/2016   EF 55 to 60%.  Normal systolic and diastolic function.  No ASD/PFO   TUBAL LIGATION  1991     Outpatient Encounter Medications as of 03/24/2024  Medication Sig Note   acetaminophen  (TYLENOL ) 325 MG tablet Take 2 tablets (650 mg total) by mouth every 6 (six) hours as needed for mild pain (pain score 1-3), fever or headache.    albuterol  (PROVENTIL  HFA) 108 (90 Base) MCG/ACT inhaler Inhale 1 to 2 puffs into the lungs every 6 hours as needed    alendronate  (FOSAMAX ) 70 MG tablet Take 1 tablet (70 mg total) by mouth once a week. Take with plain water 30 minutes before the first food, beverage, or medicine of the day. (Patient taking differently: Take 70 mg by mouth every Sunday. Take with plain water 30 minutes before the first food, beverage, or medicine of the day.)    amiodarone  (PACERONE ) 200 MG tablet Take 2 tablets (400 mg total) by mouth every 12 (twelve) hours for 5 days, THEN 1 tablet (200 mg total) daily.    amLODipine  (NORVASC ) 10 MG tablet Take 1 tablet (10 mg total) by mouth at bedtime.    amoxicillin -clavulanate (AUGMENTIN ) 875-125 MG tablet Take 1 tablet by mouth 2 (two) times daily for 10 days.    anastrozole  (ARIMIDEX ) 1 MG tablet Take 1 tablet (1 mg) by mouth daily. (Patient taking differently: Take 1 mg by mouth at bedtime.)    apixaban  (ELIQUIS ) 5 MG TABS tablet Take 1 tablet (5 mg total) by mouth 2 (two) times daily.    Calcium  Carb-Cholecalciferol  (CALCIUM  + VITAMIN D3 PO) Take 1 tablet by mouth at bedtime.    carvedilol  (COREG ) 6.25 MG tablet Take 1 tablet (6.25 mg total) by mouth 2 (two) times daily.    Cholecalciferol  (VITAMIN D -3 PO) Take 1 tablet by mouth at bedtime.    ezetimibe  (ZETIA ) 10 MG tablet Take 1 tablet (10 mg total) by mouth daily. (Patient taking differently: Take 10 mg by mouth at bedtime.)    FLUoxetine  (PROZAC ) 20 MG capsule  Take 1 capsule (20 mg total) by mouth daily. (Patient taking differently: Take 20 mg by mouth at bedtime.)    Fluticasone -Umeclidin-Vilant (TRELEGY ELLIPTA ) 200-62.5-25 MCG/ACT AEPB Inhale 1 puff into the lungs daily.    loratadine (CLARITIN) 10 MG tablet Take 10 mg by mouth daily as needed for allergies.    [Paused] [name]  Maleate (NERLYNX ) 40 MG tablet Take 6 tablets (240 mg total) by mouth daily. Take with food. (Patient not taking: Reported on 03/12/2024)    oxyCODONE  (OXY IR/ROXICODONE ) 5 MG immediate release tablet Take 1-2 tablets (5-10 mg total) by mouth every 6 (six) hours as needed for breakthrough pain.    Prenatal Vit-Fe Fumarate-FA (PRENATAL PO) Take 1 tablet by mouth at bedtime.    rosuvastatin  (CRESTOR ) 5 MG tablet  Take 1 tablet by mouth daily at bedtime ,except on Monday, Wednesday and Fridays take 2 tablets at bedtime as directed    sulfamethoxazole -trimethoprim  (BACTRIM  DS) 800-160 MG tablet Take 1 tablet by mouth 2 (two) times daily. 03/12/2024: 7 day course, pt has completed day 3.   [DISCONTINUED] hydrochlorothiazide  (HYDRODIURIL ) 25 MG tablet Take 1 tablet (25 mg total) by mouth daily. (Patient not taking: Reported on 02/05/2024)    [DISCONTINUED] lisinopril  (ZESTRIL ) 5 MG tablet Take 1 tablet (5 mg total) by mouth daily. (Patient not taking: Reported on 02/05/2024)    No facility-administered encounter medications on file as of 03/24/2024.     There were no vitals filed for this visit. There is no height or weight on file to calculate BMI.   PHYSICAL EXAM GENERAL:alert, no distress and comfortable SKIN: no rash  EYES: sclera clear NECK: without mass LYMPH:  no palpable cervical or supraclavicular lymphadenopathy  LUNGS: clear with normal breathing effort HEART: regular rate & rhythm, no lower extremity edema ABDOMEN: abdomen soft, non-tender and normal bowel sounds NEURO: alert & oriented x 3 with fluent speech, no focal motor/sensory deficits Breast exam:  PAC without  erythema    CBC    Component Value Date/Time   WBC 7.9 03/14/2024 0725   RBC 3.10 (L) 03/14/2024 0725   HGB 10.2 (L) 03/14/2024 0725   HGB 13.6 02/11/2024 0931   HCT 30.7 (L) 03/14/2024 0725   HCT 29.3 (L) 11/12/2023 0935   PLT 268 03/14/2024 0725   PLT 113 (L) 02/11/2024 0931   MCV 99.0 03/14/2024 0725   MCH 32.9 03/14/2024 0725   MCHC 33.2 03/14/2024 0725   RDW 13.6 03/14/2024 0725   LYMPHSABS 0.6 (L) 03/08/2024 0417   MONOABS 0.4 03/08/2024 0417   EOSABS 0.0 03/08/2024 0417   BASOSABS 0.0 03/08/2024 0417     CMP     Component Value Date/Time   NA 134 (L) 03/14/2024 0725   NA 135 07/24/2021 0853   K 4.0 03/14/2024 0725   CL 96 (L) 03/14/2024 0725   CO2 27 03/14/2024 0725   GLUCOSE 121 (H) 03/14/2024 0725   BUN 14 03/14/2024 0725   BUN 12 07/24/2021 0853   CREATININE 0.65 03/14/2024 0725   CREATININE 0.70 02/11/2024 0931   CALCIUM  8.4 (L) 03/14/2024 0725   PROT 5.9 (L) 03/12/2024 1602   PROT 7.0 08/03/2020 0832   ALBUMIN  2.2 (L) 03/12/2024 1602   ALBUMIN  4.1 08/03/2020 0832   AST 21 03/12/2024 1602   AST 32 02/11/2024 0931   ALT 15 03/12/2024 1602   ALT 19 02/11/2024 0931   ALKPHOS 115 03/12/2024 1602   BILITOT 0.9 03/12/2024 1602   BILITOT 0.4 02/11/2024 0931   GFRNONAA >60 03/14/2024 0725   GFRNONAA >60 02/11/2024 0931   GFRAA 107 11/03/2020 1206     ASSESSMENT & PLAN:  PLAN:  No orders of the defined types were placed in this encounter.     All questions were answered. The patient knows to call the clinic with any problems, questions or concerns. No barriers to learning were detected. I spent *** counseling the patient face to face. The total time spent in the appointment was *** and more than 50% was on counseling, review of test results, and coordination of care.   Ancil Dewan, NP-C @DATE @

## 2024-03-24 ENCOUNTER — Other Ambulatory Visit: Payer: Self-pay

## 2024-03-24 ENCOUNTER — Encounter: Payer: Self-pay | Admitting: Nurse Practitioner

## 2024-03-24 ENCOUNTER — Ambulatory Visit (HOSPITAL_COMMUNITY)

## 2024-03-24 ENCOUNTER — Inpatient Hospital Stay: Payer: PPO | Attending: Hematology

## 2024-03-24 ENCOUNTER — Inpatient Hospital Stay: Payer: PPO | Attending: Hematology | Admitting: Nurse Practitioner

## 2024-03-24 VITALS — BP 135/65 | HR 51 | Temp 98.7°F | Resp 17 | Wt 145.5 lb

## 2024-03-24 DIAGNOSIS — Z79811 Long term (current) use of aromatase inhibitors: Secondary | ICD-10-CM | POA: Diagnosis not present

## 2024-03-24 DIAGNOSIS — Z1731 Human epidermal growth factor receptor 2 positive status: Secondary | ICD-10-CM | POA: Diagnosis not present

## 2024-03-24 DIAGNOSIS — C773 Secondary and unspecified malignant neoplasm of axilla and upper limb lymph nodes: Secondary | ICD-10-CM | POA: Insufficient documentation

## 2024-03-24 DIAGNOSIS — Z170 Estrogen receptor positive status [ER+]: Secondary | ICD-10-CM | POA: Diagnosis not present

## 2024-03-24 DIAGNOSIS — C50911 Malignant neoplasm of unspecified site of right female breast: Secondary | ICD-10-CM

## 2024-03-24 DIAGNOSIS — D649 Anemia, unspecified: Secondary | ICD-10-CM

## 2024-03-24 DIAGNOSIS — Z923 Personal history of irradiation: Secondary | ICD-10-CM | POA: Insufficient documentation

## 2024-03-24 DIAGNOSIS — Z9011 Acquired absence of right breast and nipple: Secondary | ICD-10-CM | POA: Diagnosis not present

## 2024-03-24 DIAGNOSIS — Z1722 Progesterone receptor negative status: Secondary | ICD-10-CM | POA: Diagnosis not present

## 2024-03-24 DIAGNOSIS — C50811 Malignant neoplasm of overlapping sites of right female breast: Secondary | ICD-10-CM | POA: Insufficient documentation

## 2024-03-24 LAB — CBC WITH DIFFERENTIAL (CANCER CENTER ONLY)
Abs Immature Granulocytes: 0.04 10*3/uL (ref 0.00–0.07)
Basophils Absolute: 0 10*3/uL (ref 0.0–0.1)
Basophils Relative: 1 %
Eosinophils Absolute: 0.2 10*3/uL (ref 0.0–0.5)
Eosinophils Relative: 4 %
HCT: 34.7 % — ABNORMAL LOW (ref 36.0–46.0)
Hemoglobin: 11.5 g/dL — ABNORMAL LOW (ref 12.0–15.0)
Immature Granulocytes: 1 %
Lymphocytes Relative: 11 %
Lymphs Abs: 0.7 10*3/uL (ref 0.7–4.0)
MCH: 32.8 pg (ref 26.0–34.0)
MCHC: 33.1 g/dL (ref 30.0–36.0)
MCV: 98.9 fL (ref 80.0–100.0)
Monocytes Absolute: 0.6 10*3/uL (ref 0.1–1.0)
Monocytes Relative: 10 %
Neutro Abs: 4.5 10*3/uL (ref 1.7–7.7)
Neutrophils Relative %: 73 %
Platelet Count: 221 10*3/uL (ref 150–400)
RBC: 3.51 MIL/uL — ABNORMAL LOW (ref 3.87–5.11)
RDW: 14.6 % (ref 11.5–15.5)
WBC Count: 6.2 10*3/uL (ref 4.0–10.5)
nRBC: 0 % (ref 0.0–0.2)

## 2024-03-24 LAB — CMP (CANCER CENTER ONLY)
ALT: 19 U/L (ref 0–44)
AST: 24 U/L (ref 15–41)
Albumin: 3.5 g/dL (ref 3.5–5.0)
Alkaline Phosphatase: 119 U/L (ref 38–126)
Anion gap: 3 — ABNORMAL LOW (ref 5–15)
BUN: 9 mg/dL (ref 8–23)
CO2: 31 mmol/L (ref 22–32)
Calcium: 9.1 mg/dL (ref 8.9–10.3)
Chloride: 104 mmol/L (ref 98–111)
Creatinine: 0.7 mg/dL (ref 0.44–1.00)
GFR, Estimated: >60 mL/min (ref >60)
Glucose, Bld: 110 mg/dL — ABNORMAL HIGH (ref 70–99)
Potassium: 4.5 mmol/L (ref 3.5–5.1)
Sodium: 138 mmol/L (ref 135–145)
Total Bilirubin: 0.5 mg/dL (ref 0.0–1.2)
Total Protein: 6.8 g/dL (ref 6.5–8.1)

## 2024-03-24 LAB — GENETIC SCREENING ORDER

## 2024-03-25 ENCOUNTER — Other Ambulatory Visit: Payer: Self-pay

## 2024-03-25 ENCOUNTER — Other Ambulatory Visit (HOSPITAL_COMMUNITY): Payer: Self-pay

## 2024-03-25 ENCOUNTER — Telehealth: Payer: Self-pay | Admitting: Nurse Practitioner

## 2024-03-25 DIAGNOSIS — T8189XA Other complications of procedures, not elsewhere classified, initial encounter: Secondary | ICD-10-CM | POA: Diagnosis not present

## 2024-03-25 MED ORDER — ROSUVASTATIN CALCIUM 5 MG PO TABS
ORAL_TABLET | ORAL | 3 refills | Status: AC
  Filled 2024-03-25: qty 140, 98d supply, fill #0
  Filled 2024-06-19: qty 140, 98d supply, fill #1
  Filled 2024-10-25: qty 140, 98d supply, fill #2

## 2024-03-25 NOTE — Telephone Encounter (Signed)
 Patient scheduled appointments. Patient is aware of all appointment details.

## 2024-03-25 NOTE — Discharge Summary (Signed)
 Patient ID: Ashley Robertson [id number] 02/03/1956 68 y.o.  Admit date: 03/12/2024 Discharge date: 03/16/24   Discharge Diagnosis S/p Debridement of 13 x 14 x 1 cm area of the right chest wall including skin and subcutaneous tissue by Dr. Delane Fear on 4/10 for NSTI of R chest wall   Consultants Cards TRH ID  Plastics  Procedures Dr. Delane Fear - 03/12/24 - Debridement of 13 x 14 x 1 cm area of the right chest wall including skin and subcutaneous tissue   Hospital Course:  Patient with history of breast cancer noted to have significant cellulitis of the right chest wall with ulcerated/epidermolysis with necrosis. She was admitted and taken to the OR by Dr. Delane Fear on 03/12/24 for Debridement of 13 x 14 x 1 cm area of the right chest wall including skin and subcutaneous tissue.  Cardiology, ID and TRH consulted postoperatively to assist with her care. She was placed on abx per ID. She was transitioned to augmentin  based on cx's.  On POD 4 patient was arranged for [facility] RN and home VAC and was felt stable for discharge home.  Please see progress notes for more detail.  Follow-up as below. Discussed discharge instructions, restrictions and return/call back precautions.    Allergies as of 03/16/2024       Reactions   Zestril  [lisinopril ] Other (See Comments), Cough   Hyperkalemia    Lipitor [atorvastatin] Other (See Comments)   Myalgias        Medication List     PAUSE taking these medications    Nerlynx  40 MG tablet Wait to take this until your doctor or other care provider tells you to start again. Generic drug: [name]  Maleate Take 6 tablets (240 mg total) by mouth daily. Take with food.       TAKE these medications    acetaminophen  325 MG tablet Commonly known as: TYLENOL  Take 2 tablets (650 mg total) by mouth every 6 (six) hours as needed for mild pain (pain score 1-3), fever or headache.   albuterol  108 (90 Base) MCG/ACT inhaler Commonly known as: Proventil   HFA Inhale 1 to 2 puffs into the lungs every 6 hours as needed   alendronate  70 MG tablet Commonly known as: FOSAMAX  Take 1 tablet (70 mg total) by mouth once a week. Take with plain water 30 minutes before the first food, beverage, or medicine of the day. What changed: when to take this   amiodarone  200 MG tablet Commonly known as: PACERONE  Take 2 tablets (400 mg total) by mouth every 12 (twelve) hours for 5 days, THEN 1 tablet (200 mg total) daily. Start taking on: March 16, 2024   amLODipine  10 MG tablet Commonly known as: NORVASC  Take 1 tablet (10 mg total) by mouth at bedtime.   amoxicillin -clavulanate 875-125 MG tablet Commonly known as: AUGMENTIN  Take 1 tablet by mouth 2 (two) times daily for 10 days.   anastrozole  1 MG tablet Commonly known as: ARIMIDEX  Take 1 tablet (1 mg) by mouth daily. What changed: when to take this   CALCIUM  + VITAMIN D3 PO Take 1 tablet by mouth at bedtime.   carvedilol  6.25 MG tablet Commonly known as: COREG  Take 1 tablet (6.25 mg total) by mouth 2 (two) times daily.   Eliquis  5 MG Tabs tablet Generic drug: apixaban  Take 1 tablet (5 mg total) by mouth 2 (two) times daily.   ezetimibe  10 MG tablet Commonly known as: ZETIA  Take 1 tablet (10 mg total) by mouth daily. What changed:  when to take this   FLUoxetine  20 MG capsule Commonly known as: PROZAC  Take 1 capsule (20 mg total) by mouth daily. What changed: when to take this   loratadine 10 MG tablet Commonly known as: CLARITIN Take 10 mg by mouth daily as needed for allergies.   oxyCODONE  5 MG immediate release tablet Commonly known as: Oxy IR/ROXICODONE  Take 1-2 tablets (5-10 mg total) by mouth every 6 (six) hours as needed for breakthrough pain.   PRENATAL PO Take 1 tablet by mouth at bedtime.   sulfamethoxazole -trimethoprim  800-160 MG tablet Commonly known as: BACTRIM  DS Take 1 tablet by mouth 2 (two) times daily.   Trelegy Ellipta  200-62.5-25 MCG/ACT Aepb Generic drug:  Fluticasone -Umeclidin-Vilant Inhale 1 puff into the lungs daily.   VITAMIN D -3 PO Take 1 tablet by mouth at bedtime.          Follow-up Information     Enid Harry, MD Follow up on 03/31/2024.   Specialty: General Surgery Why: 03/31/24 at 3:30pm. Please bring a copy of your photo ID, insurance card and arrive 30 minutes prior to your appointment for paperwork. Contact information: [address] [place] [address] [phone]         [name], MD Follow up.   Specialty: Plastic Surgery Contact information: [address], [address] [place] [address] [phone]         Helyn Lobstein, MD Follow up.   Specialty: Family Medicine Contact information: [address] [place] [address] [phone]         Laurann Pollock, MD Follow up.   Specialty: Cardiology Why: For follow up Contact information: [address] [place] [address] [phone]         Care, [facility] Follow up.   Specialty: Home Health Services Why: [facility] for home VAC needs- they will contact you to schedule Gerarda Knights) Contact information: [address] [place] [address] [phone]         [facility] home VAC Follow up.   Why: Important Resources and Contacts  General Questions: [phone] Order Supplies: [phone], option 2, then dial ext. [phone] 24/7 Technical Support: [phone], option 3. Help with V.A.C. Therapy unit such as changing  full canister, charging the unit, therapy alarms, troubleshooting dressing applications or questions  regarding safety. Reach out to your health care provider managing your V.A.C. Therapy for any  questions directly related to your therapy Contact information: If unit alarms or if experiencing issues to contact the home health, wound  care clinic, or physician's office that is managing the V.A.C. Therapy.  If unsuccessful, call [facility] Technical Support at 1-800  [phone], option 3                Signed: Selmer Adduci, [facility] Surgery 03/25/2024, 11:51 AM Please see Amion for pager number during day hours 7:00am-4:30pm

## 2024-03-26 ENCOUNTER — Other Ambulatory Visit (HOSPITAL_COMMUNITY): Payer: Self-pay

## 2024-03-30 ENCOUNTER — Encounter: Payer: Self-pay | Admitting: Cardiology

## 2024-03-30 ENCOUNTER — Ambulatory Visit: Attending: Cardiology | Admitting: Cardiology

## 2024-03-30 VITALS — BP 120/86 | HR 100 | Ht 69.0 in | Wt 144.0 lb

## 2024-03-30 DIAGNOSIS — I4819 Other persistent atrial fibrillation: Secondary | ICD-10-CM | POA: Diagnosis not present

## 2024-03-30 DIAGNOSIS — I2584 Coronary atherosclerosis due to calcified coronary lesion: Secondary | ICD-10-CM | POA: Diagnosis not present

## 2024-03-30 DIAGNOSIS — I2510 Atherosclerotic heart disease of native coronary artery without angina pectoris: Secondary | ICD-10-CM

## 2024-03-30 DIAGNOSIS — I10 Essential (primary) hypertension: Secondary | ICD-10-CM

## 2024-03-30 DIAGNOSIS — Z5181 Encounter for therapeutic drug level monitoring: Secondary | ICD-10-CM | POA: Diagnosis not present

## 2024-03-30 DIAGNOSIS — Z79899 Other long term (current) drug therapy: Secondary | ICD-10-CM

## 2024-03-30 DIAGNOSIS — I4891 Unspecified atrial fibrillation: Secondary | ICD-10-CM

## 2024-03-30 NOTE — Patient Instructions (Signed)
 Medication Instructions:  No changes *If you need a refill on your cardiac medications before your next appointment, please call your pharmacy*  Lab Work: No labs If you have labs (blood work) drawn today and your tests are completely normal, you will receive your results only by: MyChart Message (if you have MyChart) OR A paper copy in the mail If you have any lab test that is abnormal or we need to change your treatment, we will call you to review the results.  Testing/Procedures: No testing  Follow-Up: At [facility], you and your health needs are our priority.  As part of our continuing mission to provide you with exceptional heart care, our providers are all part of one team.  This team includes your primary Cardiologist (physician) and Advanced Practice Providers or APPs (Physician Assistants and Nurse Practitioners) who all work together to provide you with the care you need, when you need it.  Your next appointment:   6 week(s)  Provider:   Katlyn West, NP  We recommend signing up for the patient portal called "MyChart".  Sign up information is provided on this After Visit Summary.  MyChart is used to connect with patients for Virtual Visits (Telemedicine).  Patients are able to view lab/test results, encounter notes, upcoming appointments, etc.  Non-urgent messages can be sent to your provider as well.   To learn more about what you can do with MyChart, go to [email or web address].

## 2024-04-02 ENCOUNTER — Other Ambulatory Visit (HOSPITAL_COMMUNITY): Payer: Self-pay

## 2024-04-02 ENCOUNTER — Other Ambulatory Visit: Payer: Self-pay | Admitting: Pulmonary Disease

## 2024-04-02 MED ORDER — TRELEGY ELLIPTA 200-62.5-25 MCG/ACT IN AEPB
1.0000 | INHALATION_SPRAY | Freq: Every day | RESPIRATORY_TRACT | 2 refills | Status: DC
  Filled 2024-04-02: qty 60, 30d supply, fill #0
  Filled 2024-05-01: qty 60, 30d supply, fill #1
  Filled 2024-06-01: qty 60, 30d supply, fill #2

## 2024-04-03 LAB — SIGNATERA
SIGNATERA MTM READOUT: 0 MTM/ml
SIGNATERA TEST RESULT: NEGATIVE

## 2024-04-07 NOTE — H&P (Signed)
 Subjective  Patient ID: Ashley Robertson is a 68 y.o. female.  HPI  4 months post op right breast reconstruction with implant exchange and left breast reduction. At 3 months post operative developed right chest erythema and drainage. She underwent removal removal right breast implant 4.5.25. Culture S aureus, on Bactrim . At 5 d post op visit from this, exam concerning for NSTI. Patient is now 3.5 weeks post operative debridement chest wall by Surgery. Cultures intraoperative from chest wall debridement negative. Pathology read as neutrophilic abscess, negative for microorganisms. ID recommended 2 w Augmentin - completed this. Wound VAC in place.  Post operatively from implant removal patient developed atrial fibrillation. She converted to sinus then was noted to be in atrial fibrillation again at time of admission for debridement chest wall. She is now on Eliquis  and Zio monitor.   Presented with palpable right breast mass. MMG/US  showed mass right breast 11 o'clock, 6 cmfn measuring 2.1 x 3.3 x 2.0 cm. A 6 o clock mass present, 3 cmfn measuring 1.2 x 1.1 x 2.6 cm. No definitive abnormal axillary LN noted, however, a single LN with 3.1 mm cortex present.  Biopsies labeled right breast 6 o clock and 11o clock both with ILC, ER+/PR-, Her2+. Biopsy of LN with metastatic disease.  MRI demonstrated 8.5 x 8.3 x 5.9 cm area of mass-like enhancement in the right breast involving all 4 quadrants and containing 2 biopsy marker clip artifacts. Biopsy-proven metastatic LN in the right axilla. Possible metastatic IM LN noted.  Staging scans negative for distant disease. Completed neoadjuvant chemotherapy. Completed adjuvant radiation 11.22.2023. US  05/14/2022 with similar size mass noted. Completed [name] 06/2023  Final pathology 9.5 cm ILC 2/5 LN+. On [name] adjuvant through Sep 2024. On Anastrozole  and neratinib . Signatera negative 03/2024  Genetics with VUS in BRIP1  Staging scans noted 5 cm AAA . Vascular  Surgery recommended repair . Had open AAA repair and remains on ASA. Off Eliquis  for hx SVT.   Prior 21 G with Soma bra, reported that she did not feel really that large. Happy with pre mastectomy size. Right mastectomy 685 g Left breast mastopexy 74 g.   MMG left 12/2023  Reports quit smoking 3.30.23. Patient is a retired Landscape architect. Her daughter is an Charity fundraiser for [employer] now, float pool for ICU.  PMH significant for HTN, HLD, pre DM, COPD.   Review of Systems  Objective  Physical Exam  Cardiovascular: Normal rate and normal heart sounds.   Pulmonary/Chest Effort normal and breath sounds normal.   Chest: VAC removed prior to visit by patient, open wound 12 x 11.5 x 0.1 cm, base 100% granulated with slough baser  Assessment/Plan  Right breast cancer overlapping sites ER+ metastatic to LN Neoadjuvant chemotherapy S/p right SRM, ALND, prepectoral TE/ADM (Alloderm) reconstruction Adjuvant RT S/p removal port, right silicone implant exchange, left mastopexy Drainage, exposure right implant S/p removal  S/p debridement chest wall for NSTI  VAC reapplied set to 125 mm Hg continuous. Wound progressing. Reviewed options for patient regarding wound including LD flap and/or STSG. Patient would like to proceed with skin graft to facilitate coverage and endpoint wound healing. Reviewed donor site pain, dressing, bleeding. Reviewed use of VAC over skin graft as bolster. Reviewed risks bleeding, failure graft, infection, cardiopulmonary complications, need for additional procedures.   Plan surgery next week. Her Cardiologist has recently left area- will plan hold Eliquis  for 3 d prior but will also contact Cardiology regarding this.   [name], MD [facility]  Office/ physician access line after hours [phone]

## 2024-04-08 ENCOUNTER — Telehealth: Payer: Self-pay | Admitting: Cardiology

## 2024-04-08 NOTE — Telephone Encounter (Addendum)
   Patient Name: Ashley Robertson  DOB: 17-Oct-1956 MRN: [id number]  Primary Cardiologist: Randene Bustard, MD  Received request from Dr. [name] regarding holding of Eliquis  prior to skin grafting of right chest wall wound on 04/13/24. Reviewed with Dr. Addie Holstein as Dr. Alease Amend is out of office. Patient with recent hospitalization and recurrent afib with a chemical cardioversion while in hospital. On follow up patient had return of atrial fibrillation, she declined cardioversion at that time. Per Dr. Addie Holstein patient it is okay to hold Eliquis  for surgery. Will reach out to pharmD team regarding timing of hold.    ADDENDUM: Reviewed with Kristin Alvastad, RPH-CPP, patient can hold Eliquis  for 2 days prior to procedure, 3 days prior is acceptable if requested by surgeon, please resume when safe to do so from a bleeding standpoint.   Will send recommendations to Dr. [name]. Please notify the office if there are any questions or concerns.   Marillyn Goren D Yasemin Rabon, NP 04/08/2024, 12:18 PM

## 2024-04-09 ENCOUNTER — Encounter (HOSPITAL_COMMUNITY): Payer: Self-pay | Admitting: Plastic Surgery

## 2024-04-09 ENCOUNTER — Other Ambulatory Visit: Payer: Self-pay

## 2024-04-09 NOTE — Progress Notes (Signed)
 PCP - Dr Helyn Lobstein Cardiologist - Dr Randene Bustard (appt with Dr Alease Amend on 05/05/24) Onc/Hematology - Dr Sonja [place]  CT Chest x-ray - 03/12/24 EKG - 03/30/24 Stress Test - n/a ECHO - 03/04/24 Cardiac Cath - n/a  ICD Pacemaker/Loop - n/a  Sleep Study -  n/a  Diabetes - n/a  Blood Thinner Instructions:  Ok to hold Eliquis  3 days prior to procedure per MD.  Last dose will be Thursday, 04/09/24.    Aspirin  Instructions: n/a  ERAS - clears til 9 AM DOS.  Anesthesia review: Yes  STOP now taking any Aspirin  (unless otherwise instructed by your surgeon), Aleve, Naproxen, Ibuprofen, Motrin, Advil, Goody's, BC's, all herbal medications, fish oil, and all vitamins.   Coronavirus Screening Do you have any of the following symptoms:  Cough yes/no: No Fever (>100.80F)  yes/no: No Runny nose yes/no: No Sore throat yes/no: No Difficulty breathing/shortness of breath  yes/no: No  Have you traveled in the last 14 days and where? yes/no: No  Patient verbalized understanding of instructions that were given via phone.

## 2024-04-10 NOTE — Anesthesia Preprocedure Evaluation (Signed)
 Anesthesia Evaluation  Patient identified by MRN, date of birth, ID band Patient awake    Reviewed: Allergy & Precautions, NPO status , Patient's Chart, lab work & pertinent test results  History of Anesthesia Complications Negative for: history of anesthetic complications  Airway Mallampati: III  TM Distance: >3 FB Neck ROM: Full   Comment: Previous grade I view with MAC 3 Dental  (+) Dental Advisory Given   Pulmonary neg shortness of breath, neg sleep apnea, COPD (used Trelegy this morning, has not needed rescue inhaler since starting Trelegy),  COPD inhaler, neg recent URI, former smoker   Pulmonary exam normal breath sounds clear to auscultation       Cardiovascular hypertension (amlodipine , carvedilol ), Pt. on medications and Pt. on home beta blockers (-) angina + CAD  (-) Past MI and (-) Cardiac Stents + dysrhythmias (PAF) + Valvular Problems/Murmurs (mild) AS  Rhythm:Regular Rate:Normal  HLD, bilateral carotid plaques, AAA s/p repair 01/11/2023  TTE 03/04/2024: IMPRESSIONS    1. Left ventricular ejection fraction, by estimation, is 50 to 55%. The  left ventricle has low normal function. The left ventricle has no regional  wall motion abnormalities.   2. Right ventricular systolic function is normal. The right ventricular  size is normal.   3. The aortic valve was not well visualized. Aortic valve regurgitation  is not visualized. Mild aortic valve stenosis. Vmax 2.5 m/s, MG ,  AVA 1.5 cm^2, DI 0.52     Neuro/Psych  PSYCHIATRIC DISORDERS Anxiety Depression    negative neurological ROS     GI/Hepatic negative GI ROS, Neg liver ROS,,,  Endo/Other  Pre-diabetes (patient reports last Hgb A1c was normal)  Renal/GU negative Renal ROS     Musculoskeletal   Abdominal   Peds  Hematology negative hematology ROS (+) Lab Results      Component                Value               Date                      WBC                       6.2                 03/24/2024                HGB                      11.5 (L)            03/24/2024                HCT                      34.7 (L)            03/24/2024                MCV                      98.9                03/24/2024                PLT  221                 03/24/2024              Anesthesia Other Findings Last Eliquis : 04/09/2024  Reproductive/Obstetrics H/o right breast cancer                             Anesthesia Physical Anesthesia Plan  ASA: 3  Anesthesia Plan: General   Post-op Pain Management: Tylenol  PO (pre-op)* and Precedex   Induction: Intravenous  PONV Risk Score and Plan: 3 and Ondansetron , Dexamethasone  and Treatment may vary due to age or medical condition  Airway Management Planned: LMA  Additional Equipment:   Intra-op Plan:   Post-operative Plan: Extubation in OR  Informed Consent: I have reviewed the patients History and Physical, chart, labs and discussed the procedure including the risks, benefits and alternatives for the proposed anesthesia with the patient or authorized representative who has indicated his/her understanding and acceptance.     Dental advisory given  Plan Discussed with: CRNA and Anesthesiologist  Anesthesia Plan Comments: (PAT note written 04/10/2024 by Marsh Heckler, PA-C.  Risks of general anesthesia discussed including, but not limited to, sore throat, hoarse voice, chipped/damaged teeth, injury to vocal cords, nausea and vomiting, allergic reactions, lung infection, heart attack, stroke, and death. All questions answered.  )       Anesthesia Quick Evaluation

## 2024-04-10 NOTE — Progress Notes (Signed)
 Anesthesia Chart Review: Procedure: APPLICATION, GRAFT, SKIN, SPLIT-THICKNESS (Chest) - **PLIT THICKNESS SKIN GRAFT FROM RIGHT OR LEFT THIGH TO RIGHT CHEST**  Anesthesia type: General  Diagnosis:     Wound, open, chest wall with complication, right, initial encounter [S21.101A]     HX: breast cancer [Z85.3]     Status post right mastectomy [Z90.11]     History of radiation therapy [Z92.3]  Pre-op diagnosis: open wound chest history breast cancer history therapeutic radiation  [facility]  Surgeons: [name], MD   OR Special Needs *Dermatome, patient to bring home VAC*    DISCUSSION: Patient is a 68 year old female scheduled for the above procedure.   History includes former smoker (quit 04/01/22), emphysema, coronary/aortic calcifications (LAD & CX with Coronary Ca Score 79 03/2020), PAF (01/2023), HTN, HLD, pre-diabetes, carotid artery disease (1-39% BICA 03/2021 US ), breast cancer (right invasive lobular carcinoma 02/09/22; left IJ Port-a-cath 03/06/22-12/20/23; began TCHP chemo 03/07/22; s/p right mastectomy, lymph node dissection and staged reconstruction 07/26/22, right silicone implant 12/20/23 with removal 03/07/14 and debridement 03/12/14 for for necrotizing cellulitis), skin cancer, rhinoplasty (1981), AAA (s/p open repair 01/11/23).   She is a retired Landscape architect.     She got established with cardiologist Dr. Addie Holstein on 03/15/20 due to HLD and coronary calcifications on CT imaging. Echo at that time showed normal LVEF with no wall motion abnormalities. 03/2020 Coronary calcium  score was 79 (80th percentile for demographic) with LAD and CX calcifications. Dr. Addie Holstein felt calcium  score was "relatively low risk". No ischemic testing recommended then. Since then she has also been evaluated by Dr. Alois Arnt and the Cardio-Oncology Clinic and was diagnosed with PAF after she developed post-operative afib in 01/2023 following open AAA repair. She was on Eliquis  briefly due to brief  event; However, recurrent afib in April 2025 during admission for right breast cellulitis. Per preoperative cardiology input by West, Katlyn, NP on 04/08/24: "Received request from Dr. [name] regarding holding of Eliquis  prior to skin grafting of right chest wall wound on 04/13/24. Reviewed with Dr. Addie Holstein as Dr. Alease Amend is out of office. Patient with recent hospitalization and recurrent afib with a chemical cardioversion while in hospital. On follow up patient had return of atrial fibrillation, she declined cardioversion at that time. Per Dr. Addie Holstein patient it is okay to hold Eliquis  for surgery.Aaron AasAaron AasReviewed with Kristin Alvastad, RPH-CPP, patient can hold Eliquis  for 2 days prior to procedure, 3 days prior is acceptable if requested by surgeon, please resume when safe to do so from a bleeding standpoint."  Anesthesia team to evaluate on the day of surgery.  Last Eliquis  04/09/24.     VS: Ht 5\' 9"  (1.753 m)   Wt 65.3 kg   BMI 21.26 kg/m  BP Readings from Last 3 Encounters:  03/30/24 120/86  03/24/24 135/65  03/16/24 125/74   Pulse Readings from Last 3 Encounters:  03/30/24 100  03/24/24 (!) 51  03/16/24 (!) 58    PROVIDERS: Helyn Lobstein, MD is PCP Sonja [place], MD is Kae Oram, MD is RAD-ONC Randene Bustard, MD is cardiologist. Appear she also has seen Alois Arnt, MD for Cardio-Oncology, last visit 02/05/24 with plans to follow-up with Arta Lark, MD Phyllis Breeze, MD is pulmonologist Caye Cocks, MD is vascular surgeon. Last visit 09/09/23. Four year follow-up recommended.   LABS: Most recent lab results include: Lab Results  Component Value Date   WBC 6.2 03/24/2024   HGB 11.5 (L) 03/24/2024  HCT 34.7 (L) 03/24/2024   PLT 221 03/24/2024   GLUCOSE 110 (H) 03/24/2024   CHOL 181 01/02/2023   TRIG 85 01/02/2023   HDL 87 01/02/2023   LDLCALC 79 01/02/2023   ALT 19 03/24/2024   AST 24 03/24/2024   NA 138 03/24/2024   K 4.5 03/24/2024   CL 104  03/24/2024   CREATININE 0.70 03/24/2024   BUN 9 03/24/2024   CO2 31 03/24/2024   TSH 0.574 03/07/2024   INR 1.2 01/11/2023   HGBA1C 5.2 03/07/2024     PFTs 04/10/21: "FVC 3.56 [93%], FEV1 2.22 [76%], F/F 63, TLC 7.23 [124%], DLCO 18.57 [79%] Moderate obstruction with bronchodilator response"    IMAGES: CT Chest 03/12/24: IMPRESSION: 1. Right lateral approach drainage catheter within the right mastectomy site, which demonstrates irregular soft tissue thickening in keeping with reported cellulitis. No discrete fluid collection. Small focus of subcutaneous emphysema adjacent to the catheter. 2. Right apical irregular consolidation and right anterior upper lobe linear scarring, architectural distortion, and traction bronchiectasis, likely post radiation changes. 3. Multiple healing left rib fractures involving the anterolateral 6th through 8th and posterior tenth ribs. 4. Prominent subcentimeter left axillary and supraclavicular lymph nodes measuring up to 9 mm, nonspecific. 5. Aortic Atherosclerosis (ICD10-I70.0). Coronary artery calcifications. Assessment for potential risk factor modification, dietary therapy or pharmacologic therapy may be warranted, if clinically indicated.    EKG: 03/30/24: Atrial fibrillation at 91 bpm When compared with ECG of 12-Mar-2024 16:08, T wave inversion no longer evident in Inferior leads Nonspecific T wave abnormality no longer evident in Lateral leads Confirmed by West, Katlyn [phone]) on 03/30/2024 5:28:55 PM  CV: Long term monitor 03/10/24: Results are pending.   TTE (Limited) 03/04/24: IMPRESSIONS   1. Left ventricular ejection fraction, by estimation, is 50 to 55%. The  left ventricle has low normal function. The left ventricle has no regional  wall motion abnormalities.   2. Right ventricular systolic function is normal. The right ventricular  size is normal.   3. The aortic valve was not well visualized. Aortic valve regurgitation  is  not visualized. Mild aortic valve stenosis. Vmax 2.5 m/s, MG ,  AVA 1.5 cm^2, DI 0.52    TTE (Complete) 11/18/23: IMPRESSIONS   1. Left ventricular ejection fraction, by estimation, is 55 to 60%. The  left ventricle has normal function. The left ventricle has no regional  wall motion abnormalities. Left ventricular diastolic parameters were  normal.   2. Right ventricular systolic function is normal. The right ventricular  size is normal. There is normal pulmonary artery systolic pressure.   3. The mitral valve is normal in structure. Mild mitral valve  regurgitation. No evidence of mitral stenosis.   4. The aortic valve is tricuspid. Aortic valve regurgitation is not  visualized. Aortic valve sclerosis/calcification is present, without any  evidence of aortic stenosis.   5. The inferior vena cava is normal in size with greater than 50%  respiratory variability, suggesting right atrial pressure of 3 mmHg.  - Comparison(s): No significant change from prior study.    US  Carotid 03/20/21: Summary:  - Right Carotid: Velocities in the right ICA are consistent with a 1-39% stenosis. Non-hemodynamically significant plaque <50% noted in the CCA. Velocities in the RICA are essentially stable compared to prior exam.  - Left Carotid: Velocities in the left ICA are consistent with a 1-39% stenosis. Non-hemodynamically significant plaque <50% noted in the CCA. Velocities in the LICA are essentially stable compared to prior exam.  -  Vertebrals:  Bilateral vertebral arteries demonstrate antegrade flow.  - Subclavians: Right subclavian artery flow was disturbed. Normal flow hemodynamics were seen in the left subclavian artery.      CT Coronary 03/24/20: - Ascending aorta: Some calcific atherosclerosis normal diameter 3.5 cm - Pericardium: Normal - Coronary arteries: Calcium  noted in LAD and circumflex IMPRESSION: Coronary calcium  score of 79. This was 72 th percentile for age and sex matched  control. - Reviewed by Dr. Addie Holstein, "Coronary calcium  score 79.  Relatively low risk.  Mild atherosclerosis in the ascending aorta. Noncardiac findings shows left upper lobe nodule seen during April 7 screening exam."     Past Medical History:  Diagnosis Date   AAA (abdominal aortic aneurysm) (HCC) 01/11/2023   Allergy    seasonal - on claritin   Anxiety and depression    Aortic atherosclerosis (HCC) 03/2020   CT Chest: 2 V (LAD & LCx) Coronary Atherosclerosis, Aortic Atherosclerosis (no aneurysm).  Mild centrilobular emphysema with mild diffuse bronchial thickening; several scattered small solitary pulmonary nodules (largest 5.6 mm in anterior left upper lobe)   Breast cancer (HCC) 12/2021   Right breast ILC   Carotid artery plaque, bilateral 10/2015   Mild to moderate plaque L>R without significant stenosis   COPD (chronic obstructive pulmonary disease) (HCC)    Coronary Artery Calcification - Score 79    Coronary Calcium  Score 79.  LAD and circumflex calcification noted.  Normal ascending aorta with mild calcification.   Current every day smoker    pt quit smoking April 2023   Dysrhythmia 01/2023   developed post op a-fib after AAA repair, converted with amiodarone    Emphysema lung (HCC)    Noted on chest CT   Family history of breast cancer 02/21/2022   Family history of prostate cancer 02/21/2022   Hyperlipidemia    Hypertension    Controlled with amlodipine    Pre-diabetes    hx , no meds   Skin cancer 2021   Remove 2022 on left shin    Past Surgical History:  Procedure Laterality Date   ABDOMINAL AORTIC ANEURYSM REPAIR N/A 01/11/2023   Procedure: ANEURYSM ABDOMINAL AORTIC REPAIR;  Surgeon: Margherita Shell, MD;  [facility];  Service: Vascular;  Laterality: N/A;   BREAST BIOPSY Right    times 3   BREAST RECONSTRUCTION WITH PLACEMENT OF TISSUE EXPANDER AND ALLODERM Right 07/26/2022   Procedure: RIGHT BREAST RECONSTRUCTION WITH PLACEMENT OF TISSUE EXPANDER AND  ALLODERM;  Surgeon: [name], MD;  [facility];  Service: Plastics;  Laterality: Right;   CESAREAN SECTION  1990, 1991   COSMETIC SURGERY  1981   Rhinoplasty   INCISION AND DRAINAGE OF WOUND Right 03/12/2024   Procedure: IRRIGATION AND DEBRIDEMENT WOUND;  Surgeon: Enid Harry, MD;  Location: [facility] OR;  Service: General;  Laterality: Right;  DEBRIDEMENT RIGHT CHEST WALL   MASTECTOMY Right    MASTECTOMY W/ SENTINEL NODE BIOPSY Right 07/26/2022   Procedure: RIGHT MASTECTOMY, RIGHT AXILLARY SENTINEL NODE BIOPSY;  Surgeon: Enid Harry, MD;  [facility];  Service: General;  Laterality: Right;  GEN & PEC BLOCK   MASTOPEXY Left 12/20/2023   Procedure: MASTOPEXY;  Surgeon: [name], MD;  Location: [facility];  Service: Plastics;  Laterality: Left;   PORT-A-CATH REMOVAL Left 12/20/2023   Procedure: REMOVAL PORT-A-CATH;  Surgeon: [name], MD;  Location: [facility];  Service: Plastics;  Laterality: Left;   PORTACATH PLACEMENT Left 03/06/2022   Procedure: INSERTION PORT-A-CATH;  Surgeon:  Enid Harry, MD;  Location: [facility];  Service: General;  Laterality: Left;   RADIOACTIVE SEED GUIDED AXILLARY SENTINEL LYMPH NODE Right 07/26/2022   Procedure: RADIOACTIVE SEED GUIDED AXILLARY SENTINEL LYMPH NODE DISSECTION;  Surgeon: Enid Harry, MD;  [facility];  Service: General;  Laterality: Right;   REMOVAL OF TISSUE EXPANDER AND PLACEMENT OF IMPLANT Right 12/20/2023   Procedure: REMOVAL OF TISSUE EXPANDER AND PLACEMENT OF SILICONE IMPLANT;  Surgeon: [name], MD;  Location: [facility];  Service: Plastics;  Laterality: Right;   REMOVAL OF TISSUE EXPANDER AND PLACEMENT OF IMPLANT Right 03/07/2024   Procedure: REMOVAL RIGHT CHEST IMPLANT;  Surgeon: [name], MD;  [facility];  Service: Plastics;  Laterality: Right;  REMOVAL OF IMPLANT   RHINOPLASTY  1981   TONSILLECTOMY  1978    TRANSTHORACIC ECHOCARDIOGRAM  10/2016   EF 55 to 60%.  Normal systolic and diastolic function.  No ASD/PFO   TUBAL LIGATION  1991    MEDICATIONS: No current facility-administered medications for this encounter.    acetaminophen  (TYLENOL ) 325 MG tablet   albuterol  (PROVENTIL  HFA) 108 (90 Base) MCG/ACT inhaler   alendronate  (FOSAMAX ) 70 MG tablet   amiodarone  (PACERONE ) 200 MG tablet   amLODipine  (NORVASC ) 10 MG tablet   anastrozole  (ARIMIDEX ) 1 MG tablet   apixaban  (ELIQUIS ) 5 MG TABS tablet   Calcium  Carb-Cholecalciferol  (CALCIUM  + VITAMIN D3 PO)   carvedilol  (COREG ) 6.25 MG tablet   Cholecalciferol  (VITAMIN D -3 PO)   ezetimibe  (ZETIA ) 10 MG tablet   FLUoxetine  (PROZAC ) 20 MG capsule   Fluticasone -Umeclidin-Vilant (TRELEGY ELLIPTA ) 200-62.5-25 MCG/ACT AEPB   loratadine (CLARITIN) 10 MG tablet   Prenatal Vit-Fe Fumarate-FA (PRENATAL PO)   rosuvastatin  (CRESTOR ) 5 MG tablet   [Paused] [name]  Maleate (NERLYNX ) 40 MG tablet   oxyCODONE  (OXY IR/ROXICODONE ) 5 MG immediate release tablet    Ella Gun, PA-C Surgical Short Stay/Anesthesiology [facility] Phone [phone] [facility] Phone [phone] 04/10/2024 11:46 AM

## 2024-04-11 DIAGNOSIS — I4891 Unspecified atrial fibrillation: Secondary | ICD-10-CM | POA: Diagnosis not present

## 2024-04-13 ENCOUNTER — Ambulatory Visit (HOSPITAL_COMMUNITY)
Admission: RE | Admit: 2024-04-13 | Discharge: 2024-04-13 | Disposition: A | Discharge status: Home / Self Care | Attending: Plastic Surgery | Admitting: Plastic Surgery

## 2024-04-13 ENCOUNTER — Other Ambulatory Visit: Payer: Self-pay

## 2024-04-13 ENCOUNTER — Ambulatory Visit (HOSPITAL_COMMUNITY): Payer: Self-pay | Admitting: Vascular Surgery

## 2024-04-13 ENCOUNTER — Encounter (HOSPITAL_COMMUNITY): Payer: Self-pay | Admitting: Plastic Surgery

## 2024-04-13 ENCOUNTER — Encounter (HOSPITAL_COMMUNITY)
Admission: RE | Disposition: A | Discharge status: Home / Self Care | Payer: Self-pay | Source: Home / Self Care | Attending: Plastic Surgery

## 2024-04-13 ENCOUNTER — Ambulatory Visit (HOSPITAL_BASED_OUTPATIENT_CLINIC_OR_DEPARTMENT_OTHER): Payer: Self-pay | Admitting: Vascular Surgery

## 2024-04-13 DIAGNOSIS — Z9221 Personal history of antineoplastic chemotherapy: Secondary | ICD-10-CM | POA: Diagnosis not present

## 2024-04-13 DIAGNOSIS — I4891 Unspecified atrial fibrillation: Secondary | ICD-10-CM | POA: Insufficient documentation

## 2024-04-13 DIAGNOSIS — Z1722 Progesterone receptor negative status: Secondary | ICD-10-CM | POA: Diagnosis not present

## 2024-04-13 DIAGNOSIS — S2190XA Unspecified open wound of unspecified part of thorax, initial encounter: Secondary | ICD-10-CM

## 2024-04-13 DIAGNOSIS — S21109A Unspecified open wound of unspecified front wall of thorax without penetration into thoracic cavity, initial encounter: Secondary | ICD-10-CM | POA: Diagnosis not present

## 2024-04-13 DIAGNOSIS — Z9011 Acquired absence of right breast and nipple: Secondary | ICD-10-CM | POA: Diagnosis not present

## 2024-04-13 DIAGNOSIS — I714 Abdominal aortic aneurysm, without rupture, unspecified: Secondary | ICD-10-CM | POA: Insufficient documentation

## 2024-04-13 DIAGNOSIS — Y838 Other surgical procedures as the cause of abnormal reaction of the patient, or of later complication, without mention of misadventure at the time of the procedure: Secondary | ICD-10-CM | POA: Diagnosis not present

## 2024-04-13 DIAGNOSIS — I10 Essential (primary) hypertension: Secondary | ICD-10-CM | POA: Diagnosis not present

## 2024-04-13 DIAGNOSIS — Z923 Personal history of irradiation: Secondary | ICD-10-CM | POA: Insufficient documentation

## 2024-04-13 DIAGNOSIS — Z87891 Personal history of nicotine dependence: Secondary | ICD-10-CM | POA: Insufficient documentation

## 2024-04-13 DIAGNOSIS — J439 Emphysema, unspecified: Secondary | ICD-10-CM | POA: Diagnosis not present

## 2024-04-13 DIAGNOSIS — I700 Atherosclerosis of aorta: Secondary | ICD-10-CM | POA: Diagnosis not present

## 2024-04-13 DIAGNOSIS — I2510 Atherosclerotic heart disease of native coronary artery without angina pectoris: Secondary | ICD-10-CM | POA: Diagnosis not present

## 2024-04-13 DIAGNOSIS — T8189XA Other complications of procedures, not elsewhere classified, initial encounter: Secondary | ICD-10-CM | POA: Insufficient documentation

## 2024-04-13 DIAGNOSIS — Z853 Personal history of malignant neoplasm of breast: Secondary | ICD-10-CM

## 2024-04-13 DIAGNOSIS — S21101A Unspecified open wound of right front wall of thorax without penetration into thoracic cavity, initial encounter: Secondary | ICD-10-CM | POA: Insufficient documentation

## 2024-04-13 DIAGNOSIS — R7303 Prediabetes: Secondary | ICD-10-CM | POA: Diagnosis not present

## 2024-04-13 DIAGNOSIS — Z7901 Long term (current) use of anticoagulants: Secondary | ICD-10-CM | POA: Insufficient documentation

## 2024-04-13 HISTORY — DX: Prediabetes: R73.03

## 2024-04-13 HISTORY — PX: SKIN SPLIT GRAFT: SHX444

## 2024-04-13 SURGERY — APPLICATION, GRAFT, SKIN, SPLIT-THICKNESS
Anesthesia: General | Site: Chest | Laterality: Right

## 2024-04-13 MED ORDER — LIDOCAINE 2% (20 MG/ML) 5 ML SYRINGE
INTRAMUSCULAR | Status: AC
  Filled 2024-04-13: qty 10

## 2024-04-13 MED ORDER — HYDROMORPHONE HCL 1 MG/ML IJ SOLN: 0.2500 mg | INTRAMUSCULAR | Status: DC | PRN

## 2024-04-13 MED ORDER — DEXAMETHASONE SODIUM PHOSPHATE 10 MG/ML IJ SOLN
INTRAMUSCULAR | Status: AC
  Filled 2024-04-13: qty 1

## 2024-04-13 MED ORDER — OXYCODONE HCL 5 MG/5ML PO SOLN: 5.0000 mg | Freq: Once | ORAL | Status: DC | PRN

## 2024-04-13 MED ORDER — MINERAL OIL LIGHT OIL
TOPICAL_OIL | Status: DC | PRN
  Administered 2024-04-13: 2 via TOPICAL

## 2024-04-13 MED ORDER — LACTATED RINGERS IV SOLN: INTRAVENOUS | Status: DC

## 2024-04-13 MED ORDER — CHLORHEXIDINE GLUCONATE 0.12 % MT SOLN
OROMUCOSAL | Status: AC
  Administered 2024-04-13: 15 mL via OROMUCOSAL
  Filled 2024-04-13: qty 15

## 2024-04-13 MED ORDER — 0.9 % SODIUM CHLORIDE (POUR BTL) OPTIME
TOPICAL | Status: DC | PRN
  Administered 2024-04-13: 1000 mL

## 2024-04-13 MED ORDER — ONDANSETRON HCL 4 MG/2ML IJ SOLN
INTRAMUSCULAR | Status: AC
  Filled 2024-04-13: qty 2

## 2024-04-13 MED ORDER — BUPIVACAINE-EPINEPHRINE (PF) 0.25% -1:200000 IJ SOLN
INTRAMUSCULAR | Status: DC | PRN
Start: 2024-04-13 — End: 2024-04-13
  Administered 2024-04-13: 30 mL via PERINEURAL

## 2024-04-13 MED ORDER — DEXAMETHASONE SODIUM PHOSPHATE 10 MG/ML IJ SOLN
INTRAMUSCULAR | Status: DC | PRN
  Administered 2024-04-13: 10 mg via INTRAVENOUS

## 2024-04-13 MED ORDER — FENTANYL CITRATE (PF) 250 MCG/5ML IJ SOLN
INTRAMUSCULAR | Status: AC
  Filled 2024-04-13: qty 5

## 2024-04-13 MED ORDER — ONDANSETRON HCL 4 MG/2ML IJ SOLN
INTRAMUSCULAR | Status: DC | PRN
  Administered 2024-04-13: 4 mg via INTRAVENOUS

## 2024-04-13 MED ORDER — MINERAL OIL LIGHT OIL
1.0000 | TOPICAL_OIL | Freq: Once | Status: DC
  Filled 2024-04-13: qty 10
  Filled 2024-04-13: qty 30

## 2024-04-13 MED ORDER — MIDAZOLAM HCL 2 MG/2ML IJ SOLN
INTRAMUSCULAR | Status: DC | PRN
  Administered 2024-04-13: 2 mg via INTRAVENOUS

## 2024-04-13 MED ORDER — KETOROLAC TROMETHAMINE 30 MG/ML IJ SOLN
INTRAMUSCULAR | Status: AC
  Filled 2024-04-13: qty 1

## 2024-04-13 MED ORDER — ORAL CARE MOUTH RINSE: 15.0000 mL | Freq: Once | OROMUCOSAL | Status: AC

## 2024-04-13 MED ORDER — CEFAZOLIN SODIUM-DEXTROSE 2-4 GM/100ML-% IV SOLN
2.0000 g | INTRAVENOUS | Status: AC
  Administered 2024-04-13: 2 g via INTRAVENOUS

## 2024-04-13 MED ORDER — OXYCODONE HCL 5 MG PO TABS: 5.0000 mg | ORAL_TABLET | Freq: Once | ORAL | Status: DC | PRN

## 2024-04-13 MED ORDER — LIDOCAINE-EPINEPHRINE 1 %-1:100000 IJ SOLN
INTRAMUSCULAR | Status: AC
  Filled 2024-04-13: qty 1

## 2024-04-13 MED ORDER — SUCCINYLCHOLINE CHLORIDE 200 MG/10ML IV SOSY
PREFILLED_SYRINGE | INTRAVENOUS | Status: AC
  Filled 2024-04-13: qty 10

## 2024-04-13 MED ORDER — CEFAZOLIN SODIUM-DEXTROSE 2-4 GM/100ML-% IV SOLN
INTRAVENOUS | Status: AC
  Filled 2024-04-13: qty 100

## 2024-04-13 MED ORDER — DEXMEDETOMIDINE HCL IN NACL 80 MCG/20ML IV SOLN
INTRAVENOUS | Status: AC
  Filled 2024-04-13: qty 20

## 2024-04-13 MED ORDER — BUPIVACAINE-EPINEPHRINE (PF) 0.25% -1:200000 IJ SOLN
INTRAMUSCULAR | Status: AC
  Filled 2024-04-13: qty 30

## 2024-04-13 MED ORDER — EPHEDRINE 5 MG/ML INJ
INTRAVENOUS | Status: AC
  Filled 2024-04-13: qty 5

## 2024-04-13 MED ORDER — ACETAMINOPHEN 500 MG PO TABS
ORAL_TABLET | ORAL | Status: AC
Start: 2024-04-13 — End: 2024-04-13
  Administered 2024-04-13: 1000 mg via ORAL
  Filled 2024-04-13: qty 2

## 2024-04-13 MED ORDER — HYDROGEN PEROXIDE 3 % EX SOLN: CUTANEOUS | Status: DC | PRN

## 2024-04-13 MED ORDER — MIDAZOLAM HCL 2 MG/2ML IJ SOLN
INTRAMUSCULAR | Status: AC
Start: 2024-04-13 — End: 2024-04-13
  Filled 2024-04-13: qty 2

## 2024-04-13 MED ORDER — PHENYLEPHRINE HCL-NACL 20-0.9 MG/250ML-% IV SOLN
INTRAVENOUS | Status: DC | PRN
  Administered 2024-04-13: 25 ug/min via INTRAVENOUS

## 2024-04-13 MED ORDER — EPHEDRINE SULFATE-NACL 50-0.9 MG/10ML-% IV SOSY
PREFILLED_SYRINGE | INTRAVENOUS | Status: DC | PRN
  Administered 2024-04-13 (×3): 2.5 mg via INTRAVENOUS

## 2024-04-13 MED ORDER — AMISULPRIDE (ANTIEMETIC) 5 MG/2ML IV SOLN: 10.0000 mg | Freq: Once | INTRAVENOUS | Status: DC | PRN

## 2024-04-13 MED ORDER — FENTANYL CITRATE (PF) 250 MCG/5ML IJ SOLN
INTRAMUSCULAR | Status: DC | PRN
Start: 2024-04-13 — End: 2024-04-13
  Administered 2024-04-13: 50 ug via INTRAVENOUS
  Administered 2024-04-13 (×5): 25 ug via INTRAVENOUS

## 2024-04-13 MED ORDER — LIDOCAINE 2% (20 MG/ML) 5 ML SYRINGE
INTRAMUSCULAR | Status: DC | PRN
  Administered 2024-04-13: 80 mg via INTRAVENOUS

## 2024-04-13 MED ORDER — ACETAMINOPHEN 500 MG PO TABS: 1000.0000 mg | ORAL_TABLET | Freq: Once | ORAL | Status: AC

## 2024-04-13 MED ORDER — PROPOFOL 10 MG/ML IV BOLUS
INTRAVENOUS | Status: DC | PRN
  Administered 2024-04-13: 150 mg via INTRAVENOUS

## 2024-04-13 MED ORDER — CHLORHEXIDINE GLUCONATE 0.12 % MT SOLN: 15.0000 mL | Freq: Once | OROMUCOSAL | Status: AC

## 2024-04-13 SURGICAL SUPPLY — 60 items
BAG COUNTER SPONGE SURGICOUNT (BAG) ×1 IMPLANT
BENZOIN TINCTURE PRP APPL 2/3 (GAUZE/BANDAGES/DRESSINGS) IMPLANT
BLADE CLIPPER SURG (BLADE) IMPLANT
BLADE DERMATOME SS (BLADE) ×1 IMPLANT
BLADE SURG 15 STRL LF DISP TIS (BLADE) IMPLANT
BNDG COHESIVE 4X5 TAN STRL LF (GAUZE/BANDAGES/DRESSINGS) IMPLANT
BNDG COHESIVE 6X5 TAN ST LF (GAUZE/BANDAGES/DRESSINGS) ×1 IMPLANT
BNDG ELASTIC 4X5.8 VLCR STR LF (GAUZE/BANDAGES/DRESSINGS) IMPLANT
BNDG ELASTIC 6INX 5YD STR LF (GAUZE/BANDAGES/DRESSINGS) IMPLANT
BNDG GAUZE DERMACEA FLUFF 4 (GAUZE/BANDAGES/DRESSINGS) IMPLANT
CANISTER SUCTION 3000ML PPV (SUCTIONS) IMPLANT
CANISTER WOUND CARE 500ML ATS (WOUND CARE) IMPLANT
CLIP APPLIE 9.375 MED OPEN (MISCELLANEOUS) IMPLANT
CNTNR URN SCR LID CUP LEK RST (MISCELLANEOUS) IMPLANT
COVER SURGICAL LIGHT HANDLE (MISCELLANEOUS) ×1 IMPLANT
DERMABOND ADVANCED .7 DNX12 (GAUZE/BANDAGES/DRESSINGS) IMPLANT
DRAIN JP 15F RND RADIO PRF (DRAIN) IMPLANT
DRAPE DERMATAC (DRAPES) IMPLANT
DRAPE HALF SHEET 40X57 (DRAPES) ×2 IMPLANT
DRAPE INCISE IOBAN 66X45 STRL (DRAPES) IMPLANT
DRAPE LAPAROSCOPIC ABDOMINAL (DRAPES) IMPLANT
DRAPE UTILITY XL STRL (DRAPES) IMPLANT
DRSG ADAPTIC 3X8 NADH LF (GAUZE/BANDAGES/DRESSINGS) ×1 IMPLANT
DRSG TELFA 3X8 NADH STRL (GAUZE/BANDAGES/DRESSINGS) ×1 IMPLANT
DRSG VAC GRANUFOAM MED (GAUZE/BANDAGES/DRESSINGS) IMPLANT
DRSG VAC GRANUFOAM SM (GAUZE/BANDAGES/DRESSINGS) IMPLANT
ELECT COATED BLADE 2.86 ST (ELECTRODE) ×1 IMPLANT
ELECTRODE REM PT RTRN 9FT ADLT (ELECTROSURGICAL) IMPLANT
EVACUATOR SILICONE 100CC (DRAIN) IMPLANT
GAUZE PAD ABD 8X10 STRL (GAUZE/BANDAGES/DRESSINGS) ×2 IMPLANT
GAUZE SPONGE 4X4 12PLY STRL (GAUZE/BANDAGES/DRESSINGS) ×1 IMPLANT
GAUZE XEROFORM 5X9 LF (GAUZE/BANDAGES/DRESSINGS) IMPLANT
GEL ULTRASOUND 20GR AQUASONIC (MISCELLANEOUS) ×1 IMPLANT
GLOVE BIO SURGEON STRL SZ 6 (GLOVE) ×1 IMPLANT
GLOVE BIOGEL PI IND STRL 7.0 (GLOVE) IMPLANT
GOWN STRL REUS W/ TWL LRG LVL3 (GOWN DISPOSABLE) ×2 IMPLANT
GRAFT DERMACARRIERS 1 TO 1.5 (DISPOSABLE) ×1 IMPLANT
HYDROGEN PEROXIDE 16OZ (MISCELLANEOUS) IMPLANT
KIT BASIN OR (CUSTOM PROCEDURE TRAY) ×1 IMPLANT
KIT TURNOVER KIT B (KITS) ×1 IMPLANT
NS IRRIG 1000ML POUR BTL (IV SOLUTION) ×1 IMPLANT
PACK GENERAL/GYN (CUSTOM PROCEDURE TRAY) ×1 IMPLANT
PAD ARMBOARD POSITIONER FOAM (MISCELLANEOUS) ×2 IMPLANT
PADDING CAST COTTON 6X4 STRL (CAST SUPPLIES) IMPLANT
PIN SAFETY STERILE (MISCELLANEOUS) ×1 IMPLANT
STAPLER VISISTAT 35W (STAPLE) IMPLANT
STOCKINETTE IMPERVIOUS LG (DRAPES) ×1 IMPLANT
SUT CHROMIC 4 0 P 3 18 (SUTURE) IMPLANT
SUT CHROMIC 4 0 RB 1X27 (SUTURE) IMPLANT
SUT ETHILON 2 0 FS 18 (SUTURE) IMPLANT
SUT MNCRL AB 4-0 PS2 18 (SUTURE) IMPLANT
SUT VIC AB 4-0 PS2 27 (SUTURE) IMPLANT
SUT VIC AB 5-0 P-3 18XBRD (SUTURE) IMPLANT
SWAB CULTURE ESWAB REG 1ML (MISCELLANEOUS) IMPLANT
SWAB CULTURE LIQUID MINI MALE (MISCELLANEOUS) IMPLANT
SYR CONTROL 10ML LL (SYRINGE) IMPLANT
TAPE CLOTH SURG 4X10 WHT LF (GAUZE/BANDAGES/DRESSINGS) IMPLANT
TOWEL GREEN STERILE (TOWEL DISPOSABLE) ×1 IMPLANT
TOWEL GREEN STERILE FF (TOWEL DISPOSABLE) ×1 IMPLANT
UNDERPAD 30X36 HEAVY ABSORB (UNDERPADS AND DIAPERS) ×1 IMPLANT

## 2024-04-13 NOTE — Interval H&P Note (Signed)
 History and Physical Interval Note:  04/13/2024 10:20 AM  Ashley Robertson  has presented today for surgery, with the diagnosis of open wound chest history breast cancer history therapeutic radiation.  The various methods of treatment have been discussed with the patient and family. After consideration of risks, benefits and other options for treatment, the patient has consented to  split thickness skin graft from right or left thigh to right chest as a surgical intervention.  The patient's history has been reviewed, patient examined, no change in status, stable for surgery.  I have reviewed the patient's chart and labs.  Questions were answered to the patient's satisfaction.     Lisabeth Rider [name]

## 2024-04-13 NOTE — Transfer of Care (Signed)
 Immediate Anesthesia Transfer of Care Note  Patient: Ashley Robertson  Procedure(s) Performed: APPLICATION, GRAFT, SKIN, SPLIT-THICKNESS (Right: Chest)  Patient Location: PACU  Anesthesia Type:General  Level of Consciousness: awake, alert , and oriented  Airway & Oxygen Therapy: Patient Spontanous Breathing  Post-op Assessment: Report given to RN and Post -op Vital signs reviewed and stable  Post vital signs: Reviewed and stable  Last Vitals:  Vitals Value Taken Time  BP 142/76 04/13/24 1411  Temp    Pulse 55 04/13/24 1413  Resp 12 04/13/24 1413  SpO2 91 % 04/13/24 1413  Vitals shown include unfiled device data.  Last Pain:  Vitals:   04/13/24 1023  TempSrc:   PainSc: 0-No pain         Complications: No notable events documented.

## 2024-04-13 NOTE — Op Note (Signed)
 Operative Note   DATE OF OPERATION: 5.12.2025  LOCATION: [facility]-outpatient  SURGICAL DIVISION: Plastic Surgery  PREOPERATIVE DIAGNOSES:  1. Open wound chest 2. History breast cancer 3. History therapeutic radiation  POSTOPERATIVE DIAGNOSES:  same  PROCEDURE:  Split thickness skin graft from right thight to right chest 11 x 12 cm  SURGEON: [name] MD MBA  ASSISTANT: none  ANESTHESIA:  General.   EBL: 20 ml  COMPLICATIONS: None immediate.   INDICATIONS FOR PROCEDURE:  The patient, Ashley Robertson, is a 68 y.o. female born on 07/14/1956, is here for treatment open wound chest following debridement for presumed necrotizing soft tissue infection.   FINDINGS: Wound base clean granulated, prior to STSG application wound measured 12 x 11 x 0.1 cm.  DESCRIPTION OF PROCEDURE:  The patient's operative site was marked with the patient in the preoperative area. The patient was taken to the operating room. SCDs were placed and IV antibiotics were given. The patient's operative site was prepped and draped in a sterile fashion. A time out was performed and all information was confirmed to be correct. Curettage performed to right chest wound. Wound irrigated. Local anesthetic infiltrated. Split thickness graft harvested from right thigh at 12/1000th inch and meshed in 1:1.5 ratio. Graft inset over chest wound with 4-0 chromic suture and Dermabond. Adaptic applied over graft followed by VAC sponge. VAC set to 125 mm Hg continuous.   Right thigh donor site dressed with Xeroform followed by dry dressing and Ace wrap.   The patient was allowed to wake from anesthesia, extubated and taken to the recovery room in satisfactory condition.   SPECIMENS: none  DRAINS: Wound VAC  [name], MD [facility] Plastic & Reconstructive Surgery  Office/ physician access line after hours [phone]

## 2024-04-13 NOTE — Anesthesia Procedure Notes (Signed)
 Procedure Name: LMA Insertion Date/Time: 04/13/2024 12:42 PM  Performed by: Laroy Plunk, CRNAPre-anesthesia Checklist: Patient identified, Emergency Drugs available, Suction available and Patient being monitored Patient Re-evaluated:Patient Re-evaluated prior to induction Oxygen Delivery Method: Circle System Utilized Preoxygenation: Pre-oxygenation with 100% oxygen Induction Type: IV induction LMA: LMA inserted LMA Size: 4.0 Number of attempts: 1 Placement Confirmation: positive ETCO2 Tube secured with: Tape Dental Injury: Teeth and Oropharynx as per pre-operative assessment

## 2024-04-14 ENCOUNTER — Ambulatory Visit: Payer: Self-pay | Admitting: *Deleted

## 2024-04-14 ENCOUNTER — Encounter (HOSPITAL_COMMUNITY): Payer: Self-pay | Admitting: Plastic Surgery

## 2024-04-14 NOTE — Anesthesia Postprocedure Evaluation (Signed)
 Anesthesia Post Note  Patient: Ashley Robertson  Procedure(s) Performed: APPLICATION, GRAFT, SKIN, SPLIT-THICKNESS (Right: Chest)     Patient location during evaluation: PACU Anesthesia Type: General Level of consciousness: awake and alert Pain management: pain level controlled Vital Signs Assessment: post-procedure vital signs reviewed and stable Respiratory status: spontaneous breathing, nonlabored ventilation, respiratory function stable and patient connected to nasal cannula oxygen Cardiovascular status: blood pressure returned to baseline and stable Postop Assessment: no apparent nausea or vomiting Anesthetic complications: no   No notable events documented.  Last Vitals:  Vitals:   04/13/24 1415 04/13/24 1430  BP: 132/68 (!) 127/56  Pulse: (!) 56 (!) 54  Resp: 14 10  Temp:    SpO2: 90% 94%    Last Pain:  Vitals:   04/13/24 1415  TempSrc:   PainSc: 0-No pain                 Melvenia Stabs

## 2024-04-15 DIAGNOSIS — M25522 Pain in left elbow: Secondary | ICD-10-CM | POA: Diagnosis not present

## 2024-04-15 DIAGNOSIS — Z6823 Body mass index (BMI) 23.0-23.9, adult: Secondary | ICD-10-CM | POA: Diagnosis not present

## 2024-04-15 DIAGNOSIS — S5002XA Contusion of left elbow, initial encounter: Secondary | ICD-10-CM | POA: Diagnosis not present

## 2024-04-15 DIAGNOSIS — T8131XA Disruption of external operation (surgical) wound, not elsewhere classified, initial encounter: Secondary | ICD-10-CM | POA: Diagnosis not present

## 2024-04-15 DIAGNOSIS — S21109A Unspecified open wound of unspecified front wall of thorax without penetration into thoracic cavity, initial encounter: Secondary | ICD-10-CM | POA: Diagnosis not present

## 2024-04-16 ENCOUNTER — Ambulatory Visit (HOSPITAL_BASED_OUTPATIENT_CLINIC_OR_DEPARTMENT_OTHER)
Admission: RE | Admit: 2024-04-16 | Discharge: 2024-04-16 | Disposition: A | Discharge status: Home / Self Care | Source: Ambulatory Visit | Attending: Family Medicine | Admitting: Family Medicine

## 2024-04-16 ENCOUNTER — Other Ambulatory Visit (HOSPITAL_COMMUNITY): Payer: Self-pay | Admitting: Family Medicine

## 2024-04-16 DIAGNOSIS — M25522 Pain in left elbow: Secondary | ICD-10-CM | POA: Diagnosis not present

## 2024-04-20 ENCOUNTER — Ambulatory Visit: Payer: PPO

## 2024-04-20 DIAGNOSIS — S21109A Unspecified open wound of unspecified front wall of thorax without penetration into thoracic cavity, initial encounter: Secondary | ICD-10-CM | POA: Diagnosis not present

## 2024-04-20 DIAGNOSIS — T8131XA Disruption of external operation (surgical) wound, not elsewhere classified, initial encounter: Secondary | ICD-10-CM | POA: Diagnosis not present

## 2024-04-28 NOTE — Progress Notes (Unsigned)
 Electrophysiology Office Note:    Date:  04/29/2024   ID:  Ashley Robertson, DOB [birth year], MRN [id number]  [facility] Cardiologist:  Ashley Bustard, MD  [facility] Electrophysiologist:  Ashley Byes, MD   Referring MD: Ashley Campus, MD   Chief Complaint: Atrial fibrillation  History of Present Illness:    [name] is a 68 year old woman who I am seeing today for an evaluation of an abnormal heart monitor at the request of Dr. Alois Robertson.  The patient last saw Dr. Amanda Robertson March 10, 2024.  The patient has a history of breast cancer diagnosed in 2023 postmastectomy, XRT.  She also has a history of hypertension and a AAA.  An open repair is planned for her aneurysm.  She is on Eliquis  for stroke prophylaxis related to atrial fibrillation.  She takes amiodarone  for rhythm control.  At the last appointment with Dr. Amanda Robertson, a ZIO monitor was ordered.  ZIO monitor showed a 5.2-second pause occurring at 10:12 AM that was asymptomatic.  The patient is a prior OR nurse at [employer].  Her daughter is a Engineer, civil (consulting) at [employer].  Today she is doing well.  She is healing from her most recent operation which was a skin graft.  Her heart rhythm is remained stable.  She has not taken amiodarone  since May 18 when she ran out of her prescription.     Their past medical, social and family history was reviewed.   ROS:   Please see the history of present illness.    All other systems reviewed and are negative.  EKGs/Labs/Other Studies Reviewed:    The following studies were reviewed today:  Apr 13, 2024 ZIO monitor personally reviewed 13% atrial fibrillation burden, mean heart rate 83 bpm 5.2-second pause.  Occurred at 10:12 AM.  Asymptomatic according to the patient. Wenckebach present  March 30, 2024 EKG shows atrial fibrillation with a ventricular rate of 91 bpm.  QTc approximately 480 ms EKG Interpretation Date/Time:  Wednesday Apr 29 2024 08:08:56 EDT Ventricular Rate:  50 PR  Interval:  182 QRS Duration:  94 QT Interval:  478 QTC Calculation: 435 R Axis:   86  Text Interpretation: Sinus bradycardia Confirmed by Harvie Liner [phone]) on 04/29/2024 8:09:45 AM    Physical Exam:    VS:  BP 124/70 (BP Location: Left Arm, Patient Position: Sitting, Cuff Size: Normal)   Pulse (!) 48   Ht 5\' 9"  (1.753 m)   Wt 156 lb (70.8 kg)   SpO2 96%   BMI 23.04 kg/m     Wt Readings from Last 3 Encounters:  04/29/24 156 lb (70.8 kg)  04/13/24 143 lb 15.4 oz (65.3 kg)  03/30/24 144 lb (65.3 kg)     GEN: no distress CARD: RRR, No MRG RESP: No IWOB. CTAB.        ASSESSMENT AND PLAN:    1. Persistent atrial fibrillation (HCC)   2. Encounter for long-term (current) use of high-risk medication     #Atrial fibrillation #High risk med monitoring-amiodarone   13% burden on recent ZIO monitor. On Eliquis  for stroke prophylaxis, continue On amiodarone  for rhythm control.  We discussed treatment options for her atrial fibrillation during today's clinic appointment.  Given recent surgeries, infection and inflammation, would recommend medical therapy for her arrhythmias for now.  Long-term, would avoid amiodarone  if possible. I discussed amiodarone  in detail today including the associated risks.  Recent LFTs in April were okay.  Free T4 was elevated in April.  If  she is maintaining sinus rhythm at the 42-month follow-up appointment, favor discontinuing amiodarone  and employing a heart rhythm monitoring strategy at that point (Apple/Samsung watch or loop recorder).  Repeat CMP, TSH and free T4 today.  #Hypertension At goal today.  Recommend checking blood pressures 1-2 times per week at home and recording the values.  Recommend bringing these recordings to the primary care physician. Continue amlodipine , carvedilol , lisinopril .  Follow-up with A-fib clinic in 6 months.      Signed, Leanora Prophet. Marven Slimmer, MD, [facility], [facility] 04/29/2024 8:23 AM    Electrophysiology [facility]

## 2024-04-29 ENCOUNTER — Encounter: Payer: Self-pay | Admitting: Cardiology

## 2024-04-29 ENCOUNTER — Other Ambulatory Visit: Payer: Self-pay

## 2024-04-29 ENCOUNTER — Other Ambulatory Visit (HOSPITAL_COMMUNITY): Payer: Self-pay

## 2024-04-29 ENCOUNTER — Ambulatory Visit: Attending: Cardiology | Admitting: Cardiology

## 2024-04-29 VITALS — BP 124/70 | HR 48 | Ht 69.0 in | Wt 156.0 lb

## 2024-04-29 DIAGNOSIS — Z79899 Other long term (current) drug therapy: Secondary | ICD-10-CM

## 2024-04-29 DIAGNOSIS — I4819 Other persistent atrial fibrillation: Secondary | ICD-10-CM

## 2024-04-29 DIAGNOSIS — I10 Essential (primary) hypertension: Secondary | ICD-10-CM

## 2024-04-29 DIAGNOSIS — Z7901 Long term (current) use of anticoagulants: Secondary | ICD-10-CM | POA: Diagnosis not present

## 2024-04-29 DIAGNOSIS — M25522 Pain in left elbow: Secondary | ICD-10-CM | POA: Diagnosis not present

## 2024-04-29 MED ORDER — AMIODARONE HCL 200 MG PO TABS
200.0000 mg | ORAL_TABLET | Freq: Every day | ORAL | 3 refills | Status: DC
  Filled 2024-04-29: qty 90, 90d supply, fill #0
  Filled 2024-07-21: qty 90, 90d supply, fill #1

## 2024-04-29 NOTE — Patient Instructions (Signed)
 Medication Instructions:  Your physician recommends that you continue on your current medications as directed. Please refer to the Current Medication list given to you today.  *If you need a refill on your cardiac medications before your next appointment, please call your pharmacy*  Lab Work: TODAY: CMET, TSH, T4  Follow-Up: At [facility], you and your health needs are our priority.  As part of our continuing mission to provide you with exceptional heart care, our providers are all part of one team.  This team includes your primary Cardiologist (physician) and Advanced Practice Providers or APPs (Physician Assistants and Nurse Practitioners) who all work together to provide you with the care you need, when you need it.  Your next appointment:   6 months  Provider:   You will follow up in the Atrial Fibrillation Clinic Your provider will be: Clint R. Fenton, PA-C or Minnie Amber, PA-C

## 2024-04-30 ENCOUNTER — Encounter: Payer: Self-pay | Admitting: Hematology

## 2024-04-30 LAB — COMPREHENSIVE METABOLIC PANEL WITH GFR
ALT: 27 IU/L (ref 0–32)
AST: 37 IU/L (ref 0–40)
Albumin: 3.9 g/dL (ref 3.9–4.9)
Alkaline Phosphatase: 139 IU/L — ABNORMAL HIGH (ref 44–121)
BUN/Creatinine Ratio: 21 (ref 12–28)
BUN: 15 mg/dL (ref 8–27)
Bilirubin Total: 0.4 mg/dL (ref 0.0–1.2)
CO2: 22 mmol/L (ref 20–29)
Calcium: 9 mg/dL (ref 8.7–10.3)
Chloride: 99 mmol/L (ref 96–106)
Creatinine, Ser: 0.73 mg/dL (ref 0.57–1.00)
Globulin, Total: 2.9 g/dL (ref 1.5–4.5)
Glucose: 116 mg/dL — ABNORMAL HIGH (ref 70–99)
Potassium: 4.9 mmol/L (ref 3.5–5.2)
Sodium: 136 mmol/L (ref 134–144)
Total Protein: 6.8 g/dL (ref 6.0–8.5)
eGFR: 90 mL/min/{1.73_m2} (ref >59)

## 2024-04-30 LAB — T4, FREE: Free T4: 1.24 ng/dL (ref 0.82–1.77)

## 2024-04-30 LAB — TSH: TSH: 4.96 u[IU]/mL — ABNORMAL HIGH (ref 0.450–4.500)

## 2024-05-03 ENCOUNTER — Ambulatory Visit: Payer: Self-pay | Admitting: Cardiology

## 2024-05-03 DIAGNOSIS — I4819 Other persistent atrial fibrillation: Secondary | ICD-10-CM

## 2024-05-03 DIAGNOSIS — Z79899 Other long term (current) drug therapy: Secondary | ICD-10-CM

## 2024-05-05 ENCOUNTER — Encounter (HOSPITAL_COMMUNITY): Payer: Self-pay | Admitting: Cardiology

## 2024-05-05 ENCOUNTER — Other Ambulatory Visit: Payer: Self-pay | Admitting: Physician Assistant

## 2024-05-05 ENCOUNTER — Ambulatory Visit (HOSPITAL_COMMUNITY)
Admission: RE | Admit: 2024-05-05 | Discharge: 2024-05-05 | Disposition: A | Discharge status: Home / Self Care | Source: Ambulatory Visit | Attending: Cardiology | Admitting: Cardiology

## 2024-05-05 VITALS — BP 148/70 | HR 49 | Ht 69.0 in | Wt 159.2 lb

## 2024-05-05 DIAGNOSIS — Z8679 Personal history of other diseases of the circulatory system: Secondary | ICD-10-CM | POA: Diagnosis not present

## 2024-05-05 DIAGNOSIS — R030 Elevated blood-pressure reading, without diagnosis of hypertension: Secondary | ICD-10-CM | POA: Insufficient documentation

## 2024-05-05 DIAGNOSIS — Z853 Personal history of malignant neoplasm of breast: Secondary | ICD-10-CM | POA: Diagnosis not present

## 2024-05-05 DIAGNOSIS — I2510 Atherosclerotic heart disease of native coronary artery without angina pectoris: Secondary | ICD-10-CM | POA: Diagnosis not present

## 2024-05-05 DIAGNOSIS — Z7901 Long term (current) use of anticoagulants: Secondary | ICD-10-CM | POA: Diagnosis not present

## 2024-05-05 DIAGNOSIS — I4891 Unspecified atrial fibrillation: Secondary | ICD-10-CM | POA: Diagnosis not present

## 2024-05-05 DIAGNOSIS — I10 Essential (primary) hypertension: Secondary | ICD-10-CM

## 2024-05-05 DIAGNOSIS — Z170 Estrogen receptor positive status [ER+]: Secondary | ICD-10-CM

## 2024-05-05 DIAGNOSIS — Z7960 Long term (current) use of unspecified immunomodulators and immunosuppressants: Secondary | ICD-10-CM | POA: Insufficient documentation

## 2024-05-05 DIAGNOSIS — I2584 Coronary atherosclerosis due to calcified coronary lesion: Secondary | ICD-10-CM | POA: Diagnosis not present

## 2024-05-05 DIAGNOSIS — Z79899 Other long term (current) drug therapy: Secondary | ICD-10-CM | POA: Insufficient documentation

## 2024-05-05 NOTE — Progress Notes (Signed)
 Cardio-Oncology Clinic Consult Note   Referring Physician: Dr. Amanda Jungling Primary Care: Primary Cardiologist:  HPI:  Ashley Robertson is a 68 y.o. female with past medical history of HER2+, ER+ R breast cancer, who has been referred by Dr. Amanda Jungling to establish in the cardio-oncology clinic for monitoring of cardio-toxicity while undergoing chemotherapy.       Oncology History Overview Note   Cancer Staging  Malignant neoplasm of overlapping sites of right breast in female, estrogen receptor positive (HCC) Staging form: Breast, AJCC 8th Edition - Clinical stage from 02/09/2022: Stage IIA (cT2, cN1, cM0, G2, ER+, PR-, HER2+) - Signed by Sonja Wellman, MD on 02/20/2022    Malignant neoplasm of overlapping sites of right breast in female, estrogen receptor positive (HCC)  02/08/2022 Mammogram   CLINICAL DATA:  Acute onset right breast lump.  EXAM: DIGITAL DIAGNOSTIC UNILATERAL RIGHT MAMMOGRAM WITH TOMOSYNTHESIS AND CAD; ULTRASOUND RIGHT BREAST LIMITED  IMPRESSION: 1. Highly suspicious findings mammographically and sonographically. I suspect there is significant malignancy throughout most of the right breast. Discrete masses are seen at 11 o'clock and 6 o'clock.  A single borderline lymph node is identified. This lymph node appears prominent compared to the remainder of the lymph nodes. The skin thickening suggests the possibility of inflammatory breast cancer.   02/09/2022 Cancer Staging   Staging form: Breast, AJCC 8th Edition - Clinical stage from 02/09/2022: Stage IIIA (cT3, cN1, cM0, G2, ER+, PR-, HER2+) - Signed by Sonja [place], MD on 03/05/2022 Stage prefix: Initial diagnosis Histologic grading system: 3 grade system   02/09/2022 Initial Biopsy   Diagnosis 1. Breast, right, needle core biopsy, right breast 11 o'clock mass ribbon clip - INVASIVE MAMMARY CARCINOMA - SEE COMMENT 2. Breast, right, needle core biopsy, right breast 6'oclock mass, coil clip - INVASIVE MAMMARY CARCINOMA - SEE  COMMENT 3. Lymph node, needle/core biopsy, right axillary lymph node, tribell clip - METASTATIC CARCINOMA INVOLVING NODAL TISSUE Microscopic Comment 1. and 2. The biopsy material shows an infiltrative proliferation of cells arranged linearly and in small clusters. Based on the biopsy, the carcinoma appears Nottingham grade 2 of 3 and measures 1.5 cm in greatest linear extent.  Addendum parts 1 and 2: Immunohistochemistry for E-cadherin is negative consistent with lobular carcinoma.  2. PROGNOSTIC INDICATORS Results: The tumor cells are EQUIVOCAL for Her2 (2+). Her2 by FISH will be performed and the results reported separately. Estrogen Receptor: 100%, POSITIVE, STRONG STAINING INTENSITY Progesterone Receptor: <1%, NEGATIVE Proliferation Marker Ki67: 10%  2. FLUORESCENCE IN-SITU HYBRIDIZATION Results: GROUP 1: HER2 **POSITIVE**   3. PROGNOSTIC INDICATORS Results: By immunohistochemistry, the tumor cells are POSITIVE for Her2 (3+). Estrogen Receptor: 100%, POSITIVE, STRONG STAINING INTENSITY Progesterone Receptor: <1%, NEGATIVE   02/16/2022 Initial Diagnosis   Malignant neoplasm of overlapping sites of right breast in female, estrogen receptor positive (HCC)   03/01/2022 Imaging   EXAM: BILATERAL BREAST MRI WITH AND WITHOUT CONTRAST  IMPRESSION: 1. 8.5 x 8.3 x 5.9 cm area of confluent mass-like enhancement in the right breast involving all 4 quadrants and containing 2 biopsy marker clip artifacts, compatible with 4 quadrant biopsy-proven malignancy. 2. Biopsy-proven metastatic lymph node in the right axilla. 3. Possible metastatic intramammary lymph node in the posterior outer right breast just above the level of the nipple. 4. No evidence of malignancy on the left.   03/01/2022 Genetic Testing   Negative hereditary cancer genetic testing: no pathogenic variants detected in [facility] CustomNext-Cancer +RNAinsight Panel.  Variant of uncertain significance reported in BRIP1 at  p.F934V (c.2800T>G). Report  date is March 01, 2022.    The CustomNext-Cancer+RNAinsight panel offered by [employer] includes sequencing and rearrangement analysis for the following 47 genes:  APC, ATM, AXIN2, BARD1, BMPR1A, BRCA1, BRCA2, BRIP1, CDH1, CDK4, CDKN2A, CHEK2, DICER1, EPCAM, GREM1, HOXB13, MEN1, MLH1, MSH2, MSH3, MSH6, MUTYH, NBN, NF1, NF2, NTHL1, PALB2, PMS2, POLD1, POLE, PTEN, RAD51C, RAD51D, RECQL, RET, SDHA, SDHAF2, SDHB, SDHC, SDHD, SMAD4, SMARCA4, STK11, TP53, TSC1, TSC2, and VHL.  RNA data is routinely analyzed for use in variant interpretation for all genes.  UPDATE: BRIP1 p.F934V VUS has been amended to Likely Benign. Amended report date is 11/06/2022.    03/05/2022 Imaging   EXAM: CT CHEST, ABDOMEN, AND PELVIS WITH CONTRAST  IMPRESSION: 1. Asymmetric, masslike density of the glandular tissue of the right breast with overlying skin thickening, in keeping with known primary breast malignancy. 2. Enlarged, ill-defined right axillary lymph node containing a biopsy marking clip, consistent with known nodal metastatic disease. 3. No other evidence of lymphadenopathy or metastatic disease in the chest, abdomen, or pelvis. 4. Emphysema. Background of fine centrilobular nodularity, most concentrated in the lung apices, consistent with smoking-related respiratory bronchiolitis. 5. Aneurysm of the infrarenal abdominal aorta measuring up to 5.4 x 5.3 cm with a large burden of eccentric mural thrombus. Recommend follow-up CT/MR every 6 months and vascular consultation. This recommendation follows ACR consensus guidelines: White Paper of the ACR Incidental Findings Committee II on Vascular Findings. J Am Coll Radiol 2013; 10:789-794. 6. Coronary artery disease.   03/05/2022 Imaging   EXAM: NUCLEAR MEDICINE WHOLE BODY BONE SCAN  IMPRESSION: No evidence of bony metastatic disease.   03/07/2022 - 08/01/2022 Chemotherapy   Patient is on Treatment Plan : BREAST  Docetaxel  + Carboplatin  +  Trastuzumab  + Pertuzumab   (TCHP) q21d      07/26/2022 Surgery   Right mastectomy with axillary node seed guided excision converted to axillary node dissection with reconstruction by Drs. Delane Fear and Thimmappa   07/26/2022 Pathology Results   FINAL MICROSCOPIC DIAGNOSIS:   A. LYMPH NODE, RIGHT AXILLARY TARGETED, EXCISION:  - Metastatic carcinoma involving one lymph node, 2 cm (1/1).  - Focal extranodal extension.  - Biopsy site and biopsy clip.   B. BREAST, RIGHT, MASTECTOMY:  - Invasive lobular carcinoma, 9.5 cm (ypT3).  - Carcinoma involves dermis of nipple.  - All surgical margins negative for carcinoma.  - One lymph node negative for metastatic carcinoma (0/1).  - Biopsy sites and biopsy clips.  - See oncology table.   C. LYMPH NODE, RIGHT AXILLARY, SENTINEL, EXCISION:  - One lymph node negative for metastatic carcinoma (0/1).   D. LYMPH NODE, RIGHT AXILLARY, SENTINEL, EXCISION:  - Metastatic carcinoma in one lymph node, 0.5 cm. (1/1).   E. LYMPH NODE, RIGHT AXILLARY, SENTINEL, EXCISION:  - One lymph node negative for metastatic carcinoma (0/1).    07/26/2022 Cancer Staging   Cancer Staging  Malignant neoplasm of overlapping sites of right breast in female, estrogen receptor positive (HCC) Staging form: Breast, AJCC 8th Edition - Clinical stage from 02/09/2022: Stage IIIA (cT3, cN1, cM0, G2, ER+, PR-, HER2+) - Signed by Sonja [place], MD on 03/05/2022 Stage prefix: Initial diagnosis Histologic grading system: 3 grade system - Pathologic stage from 07/26/2022: Stage IIIA (pT3, pN1a, cM0, G2, ER+, PR-, HER2+) - Signed by Rolin Clifton, NP on 08/01/2022 Histologic grading system: 3 grade system    07/26/2022 Cancer Staging   Staging form: Breast, AJCC 8th Edition - Pathologic stage from 07/26/2022: Stage IIIA (pT3, pN1a, cM0, G2, ER+,  PR-, HER2+) - Signed by Burton, Lacie K, NP on 08/01/2022 Histologic grading system: 3 grade system   08/24/2022 -  Chemotherapy   Patient is on  Treatment Plan : BREAST ADO-Trastuzumab Emtansine  (Kadcyla ) q21d     07/24/2023 Imaging   Echocardiogram  Normal LVF at 55-60 % Normal left diastolic function. Normal right ventricle  Normal visualized valves with preserved function   03/24/2024 Survivorship   SCP delivered by Lacie Burton, NP     Patient with a relevant cardiac history of atrial fibrillation that is followed by electrophysiology, AAA with prior open repair, and increased coronary artery calcifications.       Patient overall reports that she is doing well.  She is recovering well from her plastic surgery this past month.  She denies any cardiac complaints.  She notes that her heart rate is slow at baseline but she denies any shortness of breath with activity.  She remains on neratinib .  Blood pressure elevated today but reports that it is usually controlled at home.  She does not take lisinopril  with prior cough with the medication.   Medical history and current medications were reviewed in Epic as part of clinic visit.   PHYSICAL EXAM: Vitals:   05/05/24 0928  BP: (!) 148/70  Pulse: (!) 49  SpO2: 97%   GENERAL: Well nourished and in no apparent distress at rest.  PULM:  Normal work of breathing, clear to auscultation bilaterally. Respirations are unlabored.  CARDIAC:  JVP: flat         Normal rate with regular rhythm. No murmurs, rubs or gallops.  no edema. Warm and well perfused extremities. ABDOMEN: Soft, non-tender, non-distended. NEUROLOGIC: Patient is oriented x3 with no focal or lateralizing neurologic deficits.      ASSESSMENT & PLAN:  1. Cardiooncolgy - On Her 2 since 2023, was on pertuzumab  and trastuzumab  without great response to therapy, than on Kadcyla , now on neratinib . On cardioprotective therapy with carvedilol , tolerating well. - Continue carvedilol  6.25mg  BID, low threshold to dose reduce if symptomatic bradycardia - Baseline BNP normal - Reviewed limited echo from 03/2024, normal EF and  GLS, repeat ordered for August  Atrial fibrillation:  - Follows with EP - Continue eliquis  5mg  BID, can hold for procedures  Elevated CAC:  - Continue eliquis  as well as low dose crestor  5mg  daily - Would like to avoid additional therapy  AAA:  - S/p repair, eliquis  and statin as above  All parameters stable. Reviewed signs and symptoms of HF to look for. Ok to continue chemotherapy. Follow-up with limited echo in 2 months.   Arta Lark, MD Advanced Heart Failure Mechanical Circulatory Support Cardio-Oncology 05/05/24

## 2024-05-05 NOTE — Patient Instructions (Signed)
 God to see you today!   No medication changes were made  Your physician recommends that you schedule a follow-up appointment in: 3 months(September) Call office in July to schedule an appointment  If you have any questions or concerns before your next appointment please send us  a message through [place] or call our office at [phone].    TO LEAVE A MESSAGE FOR THE NURSE SELECT OPTION 2, PLEASE LEAVE A MESSAGE INCLUDING: YOUR NAME DATE OF BIRTH CALL BACK NUMBER REASON FOR CALL**this is important as we prioritize the call backs  YOU WILL RECEIVE A CALL BACK THE SAME DAY AS LONG AS YOU CALL BEFORE 4:00 PM At the [facility], you and your health needs are our priority. As part of our continuing mission to provide you with exceptional heart care, we have created designated Provider Care Teams. These Care Teams include your primary Cardiologist (physician) and Advanced Practice Providers (APPs- Physician Assistants and Nurse Practitioners) who all work together to provide you with the care you need, when you need it.   You may see any of the following providers on your designated Care Team at your next follow up: Dr Jules Oar Dr Peder Bourdon Dr. Alwin Baars Dr. Arta Lark Amy Marijane Shoulders, NP Ruddy Corral, [facility] [place], [place] Dennise Fitz, NP [place] Lee, NP Shawnee Dellen, NP Luster Salters, PharmD Bevely Brush, PharmD   Please be sure to bring in all your medications bottles to every appointment.    Thank you for choosing [facility]

## 2024-05-06 ENCOUNTER — Ambulatory Visit: Admitting: Nurse Practitioner

## 2024-05-06 ENCOUNTER — Other Ambulatory Visit

## 2024-05-06 ENCOUNTER — Inpatient Hospital Stay: Attending: Hematology

## 2024-05-06 ENCOUNTER — Inpatient Hospital Stay: Admitting: Physician Assistant

## 2024-05-06 VITALS — BP 142/73 | HR 52 | Temp 97.7°F | Resp 16 | Wt 160.6 lb

## 2024-05-06 DIAGNOSIS — C50811 Malignant neoplasm of overlapping sites of right female breast: Secondary | ICD-10-CM | POA: Insufficient documentation

## 2024-05-06 DIAGNOSIS — C773 Secondary and unspecified malignant neoplasm of axilla and upper limb lymph nodes: Secondary | ICD-10-CM | POA: Diagnosis not present

## 2024-05-06 DIAGNOSIS — C50911 Malignant neoplasm of unspecified site of right female breast: Secondary | ICD-10-CM | POA: Diagnosis not present

## 2024-05-06 DIAGNOSIS — Z79811 Long term (current) use of aromatase inhibitors: Secondary | ICD-10-CM | POA: Insufficient documentation

## 2024-05-06 DIAGNOSIS — Z170 Estrogen receptor positive status [ER+]: Secondary | ICD-10-CM | POA: Insufficient documentation

## 2024-05-06 DIAGNOSIS — Z9011 Acquired absence of right breast and nipple: Secondary | ICD-10-CM | POA: Diagnosis not present

## 2024-05-06 DIAGNOSIS — Z1722 Progesterone receptor negative status: Secondary | ICD-10-CM | POA: Insufficient documentation

## 2024-05-06 DIAGNOSIS — Z4889 Encounter for other specified surgical aftercare: Secondary | ICD-10-CM | POA: Diagnosis not present

## 2024-05-06 DIAGNOSIS — Z1731 Human epidermal growth factor receptor 2 positive status: Secondary | ICD-10-CM | POA: Insufficient documentation

## 2024-05-06 LAB — CMP (CANCER CENTER ONLY)
ALT: 21 U/L (ref 0–44)
AST: 26 U/L (ref 15–41)
Albumin: 4 g/dL (ref 3.5–5.0)
Alkaline Phosphatase: 114 U/L (ref 38–126)
Anion gap: 6 (ref 5–15)
BUN: 21 mg/dL (ref 8–23)
CO2: 30 mmol/L (ref 22–32)
Calcium: 9.1 mg/dL (ref 8.9–10.3)
Chloride: 104 mmol/L (ref 98–111)
Creatinine: 0.74 mg/dL (ref 0.44–1.00)
GFR, Estimated: >60 mL/min (ref >60)
Glucose, Bld: 110 mg/dL — ABNORMAL HIGH (ref 70–99)
Potassium: 4.3 mmol/L (ref 3.5–5.1)
Sodium: 140 mmol/L (ref 135–145)
Total Bilirubin: 0.4 mg/dL (ref 0.0–1.2)
Total Protein: 7.3 g/dL (ref 6.5–8.1)

## 2024-05-06 LAB — CBC WITH DIFFERENTIAL (CANCER CENTER ONLY)
Abs Immature Granulocytes: 0.02 10*3/uL (ref 0.00–0.07)
Basophils Absolute: 0 10*3/uL (ref 0.0–0.1)
Basophils Relative: 1 %
Eosinophils Absolute: 0.2 10*3/uL (ref 0.0–0.5)
Eosinophils Relative: 4 %
HCT: 40.4 % (ref 36.0–46.0)
Hemoglobin: 13.3 g/dL (ref 12.0–15.0)
Immature Granulocytes: 1 %
Lymphocytes Relative: 19 %
Lymphs Abs: 0.7 10*3/uL (ref 0.7–4.0)
MCH: 32.2 pg (ref 26.0–34.0)
MCHC: 32.9 g/dL (ref 30.0–36.0)
MCV: 97.8 fL (ref 80.0–100.0)
Monocytes Absolute: 0.5 10*3/uL (ref 0.1–1.0)
Monocytes Relative: 13 %
Neutro Abs: 2.4 10*3/uL (ref 1.7–7.7)
Neutrophils Relative %: 62 %
Platelet Count: 154 10*3/uL (ref 150–400)
RBC: 4.13 MIL/uL (ref 3.87–5.11)
RDW: 15.3 % (ref 11.5–15.5)
WBC Count: 3.8 10*3/uL — ABNORMAL LOW (ref 4.0–10.5)
nRBC: 0 % (ref 0.0–0.2)

## 2024-05-06 NOTE — Progress Notes (Signed)
 [facility]   [phone] [phone]   Clinic Follow up Note   Patient Care Team: Helyn Lobstein, MD as PCP - General (Family Medicine) Arleen Lacer, MD as PCP - Cardiology (Cardiology) Boyce Byes, MD as PCP - Electrophysiology (Cardiology) Auther Bo, RN as Oncology Nurse Navigator Alane Hsu, RN as Oncology Nurse Navigator Enid Harry, MD as Consulting Physician (General Surgery) Sonja Whitewater, MD as Consulting Physician (Hematology) Johna Myers, MD as Consulting Physician (Radiation Oncology) Burton, Lacie K, NP as Nurse Practitioner (Nurse Practitioner)  Date of Service:  05/06/2024  CHIEF COMPLAINT: f/u of breast cancer  CURRENT THERAPY: Anastrozole  and neratinib    SUMMARY OF ONCOLOGIC HISTORY: Oncology History Overview Note   Cancer Staging  Malignant neoplasm of overlapping sites of right breast in female, estrogen receptor positive (HCC) Staging form: Breast, AJCC 8th Edition - Clinical stage from 02/09/2022: Stage IIA (cT2, cN1, cM0, G2, ER+, PR-, HER2+) - Signed by Sonja Holdenville, MD on 02/20/2022    Malignant neoplasm of overlapping sites of right breast in female, estrogen receptor positive (HCC)  02/08/2022 Mammogram   CLINICAL DATA:  Acute onset right breast lump.  EXAM: DIGITAL DIAGNOSTIC UNILATERAL RIGHT MAMMOGRAM WITH TOMOSYNTHESIS AND CAD; ULTRASOUND RIGHT BREAST LIMITED  IMPRESSION: 1. Highly suspicious findings mammographically and sonographically. I suspect there is significant malignancy throughout most of the right breast. Discrete masses are seen at 11 o'clock and 6 o'clock.  A single borderline lymph node is identified. This lymph node appears prominent compared to the remainder of the lymph nodes. The skin thickening suggests the possibility of inflammatory breast cancer.   02/09/2022 Cancer Staging   Staging form: Breast, AJCC 8th Edition - Clinical stage from 02/09/2022: Stage IIIA (cT3, cN1, cM0,  G2, ER+, PR-, HER2+) - Signed by Sonja , MD on 03/05/2022 Stage prefix: Initial diagnosis Histologic grading system: 3 grade system   02/09/2022 Initial Biopsy   Diagnosis 1. Breast, right, needle core biopsy, right breast 11 o'clock mass ribbon clip - INVASIVE MAMMARY CARCINOMA - SEE COMMENT 2. Breast, right, needle core biopsy, right breast 6'oclock mass, coil clip - INVASIVE MAMMARY CARCINOMA - SEE COMMENT 3. Lymph node, needle/core biopsy, right axillary lymph node, tribell clip - METASTATIC CARCINOMA INVOLVING NODAL TISSUE Microscopic Comment 1. and 2. The biopsy material shows an infiltrative proliferation of cells arranged linearly and in small clusters. Based on the biopsy, the carcinoma appears Nottingham grade 2 of 3 and measures 1.5 cm in greatest linear extent.  Addendum parts 1 and 2: Immunohistochemistry for E-cadherin is negative consistent with lobular carcinoma.  2. PROGNOSTIC INDICATORS Results: The tumor cells are EQUIVOCAL for Her2 (2+). Her2 by FISH will be performed and the results reported separately. Estrogen Receptor: 100%, POSITIVE, STRONG STAINING INTENSITY Progesterone Receptor: <1%, NEGATIVE Proliferation Marker Ki67: 10%  2. FLUORESCENCE IN-SITU HYBRIDIZATION Results: GROUP 1: HER2 **POSITIVE**   3. PROGNOSTIC INDICATORS Results: By immunohistochemistry, the tumor cells are POSITIVE for Her2 (3+). Estrogen Receptor: 100%, POSITIVE, STRONG STAINING INTENSITY Progesterone Receptor: <1%, NEGATIVE   02/16/2022 Initial Diagnosis   Malignant neoplasm of overlapping sites of right breast in female, estrogen receptor positive (HCC)   03/01/2022 Imaging   EXAM: BILATERAL BREAST MRI WITH AND WITHOUT CONTRAST  IMPRESSION: 1. 8.5 x 8.3 x 5.9 cm area of confluent mass-like enhancement in the right breast involving all 4 quadrants and containing 2 biopsy marker clip artifacts, compatible with 4 quadrant biopsy-proven malignancy. 2. Biopsy-proven  metastatic lymph node in the right axilla.  3. Possible metastatic intramammary lymph node in the posterior outer right breast just above the level of the nipple. 4. No evidence of malignancy on the left.   03/01/2022 Genetic Testing   Negative hereditary cancer genetic testing: no pathogenic variants detected in [facility] CustomNext-Cancer +RNAinsight Panel.  Variant of uncertain significance reported in BRIP1 at p.F934V (c.2800T>G). Report date is March 01, 2022.    The CustomNext-Cancer+RNAinsight panel offered by [employer] includes sequencing and rearrangement analysis for the following 47 genes:  APC, ATM, AXIN2, BARD1, BMPR1A, BRCA1, BRCA2, BRIP1, CDH1, CDK4, CDKN2A, CHEK2, DICER1, EPCAM, GREM1, HOXB13, MEN1, MLH1, MSH2, MSH3, MSH6, MUTYH, NBN, NF1, NF2, NTHL1, PALB2, PMS2, POLD1, POLE, PTEN, RAD51C, RAD51D, RECQL, RET, SDHA, SDHAF2, SDHB, SDHC, SDHD, SMAD4, SMARCA4, STK11, TP53, TSC1, TSC2, and VHL.  RNA data is routinely analyzed for use in variant interpretation for all genes.  UPDATE: BRIP1 p.F934V VUS has been amended to Likely Benign. Amended report date is 11/06/2022.    03/05/2022 Imaging   EXAM: CT CHEST, ABDOMEN, AND PELVIS WITH CONTRAST  IMPRESSION: 1. Asymmetric, masslike density of the glandular tissue of the right breast with overlying skin thickening, in keeping with known primary breast malignancy. 2. Enlarged, ill-defined right axillary lymph node containing a biopsy marking clip, consistent with known nodal metastatic disease. 3. No other evidence of lymphadenopathy or metastatic disease in the chest, abdomen, or pelvis. 4. Emphysema. Background of fine centrilobular nodularity, most concentrated in the lung apices, consistent with smoking-related respiratory bronchiolitis. 5. Aneurysm of the infrarenal abdominal aorta measuring up to 5.4 x 5.3 cm with a large burden of eccentric mural thrombus. Recommend follow-up CT/MR every 6 months and vascular consultation. This  recommendation follows ACR consensus guidelines: White Paper of the ACR Incidental Findings Committee II on Vascular Findings. J Am Coll Radiol 2013; 10:789-794. 6. Coronary artery disease.   03/05/2022 Imaging   EXAM: NUCLEAR MEDICINE WHOLE BODY BONE SCAN  IMPRESSION: No evidence of bony metastatic disease.   03/07/2022 - 08/01/2022 Chemotherapy   Patient is on Treatment Plan : BREAST  Docetaxel  + Carboplatin  + Trastuzumab  + Pertuzumab   (TCHP) q21d      07/26/2022 Surgery   Right mastectomy with axillary node seed guided excision converted to axillary node dissection with reconstruction by Drs. Delane Fear and Thimmappa   07/26/2022 Pathology Results   FINAL MICROSCOPIC DIAGNOSIS:   A. LYMPH NODE, RIGHT AXILLARY TARGETED, EXCISION:  - Metastatic carcinoma involving one lymph node, 2 cm (1/1).  - Focal extranodal extension.  - Biopsy site and biopsy clip.   B. BREAST, RIGHT, MASTECTOMY:  - Invasive lobular carcinoma, 9.5 cm (ypT3).  - Carcinoma involves dermis of nipple.  - All surgical margins negative for carcinoma.  - One lymph node negative for metastatic carcinoma (0/1).  - Biopsy sites and biopsy clips.  - See oncology table.   C. LYMPH NODE, RIGHT AXILLARY, SENTINEL, EXCISION:  - One lymph node negative for metastatic carcinoma (0/1).   D. LYMPH NODE, RIGHT AXILLARY, SENTINEL, EXCISION:  - Metastatic carcinoma in one lymph node, 0.5 cm. (1/1).   E. LYMPH NODE, RIGHT AXILLARY, SENTINEL, EXCISION:  - One lymph node negative for metastatic carcinoma (0/1).    07/26/2022 Cancer Staging   Cancer Staging  Malignant neoplasm of overlapping sites of right breast in female, estrogen receptor positive (HCC) Staging form: Breast, AJCC 8th Edition - Clinical stage from 02/09/2022: Stage IIIA (cT3, cN1, cM0, G2, ER+, PR-, HER2+) - Signed by Sonja Albion, MD on 03/05/2022 Stage prefix: Initial diagnosis Histologic  grading system: 3 grade system - Pathologic stage from 07/26/2022: Stage IIIA  (pT3, pN1a, cM0, G2, ER+, PR-, HER2+) - Signed by Rolin Clifton, NP on 08/01/2022 Histologic grading system: 3 grade system    07/26/2022 Cancer Staging   Staging form: Breast, AJCC 8th Edition - Pathologic stage from 07/26/2022: Stage IIIA (pT3, pN1a, cM0, G2, ER+, PR-, HER2+) - Signed by Burton, Lacie K, NP on 08/01/2022 Histologic grading system: 3 grade system   08/24/2022 -  Chemotherapy   Patient is on Treatment Plan : BREAST ADO-Trastuzumab Emtansine  (Kadcyla ) q21d     07/24/2023 Imaging   Echocardiogram  Normal LVF at 55-60 % Normal left diastolic function. Normal right ventricle  Normal visualized valves with preserved function   03/24/2024 Survivorship   SCP delivered by Lacie Burton, NP     INTERVAL HISTORY:  Carman Chimera returns for a follow up visit. She recently underwent skin graft placement from right thigh to right chest. She is recovering well and wound has healed without issue. She continues on Anastrozole  and has continues to hold Nerlynx . She reports her energy and appetite are overall stable. She is trying to consume high protein diet to aid in healing. She drinks 2 protein shakes per day. She denies nausea, vomiting or bowel habit changes. She bruises easily on her forearms but denies any overt signs of bleeding such as hematochezia or melena. She denies fevers, chills, sweats, shortness of breath, chest pain or cough. She has no other complaints.     All other systems were reviewed with the patient and are negative.  MEDICAL HISTORY:  Past Medical History:  Diagnosis Date   AAA (abdominal aortic aneurysm) (HCC) 01/11/2023   Allergy    seasonal - on claritin   Anxiety and depression    Aortic atherosclerosis (HCC) 03/2020   CT Chest: 2 V (LAD & LCx) Coronary Atherosclerosis, Aortic Atherosclerosis (no aneurysm).  Mild centrilobular emphysema with mild diffuse bronchial thickening; several scattered small solitary pulmonary nodules (largest 5.6 mm in  anterior left upper lobe)   Breast cancer (HCC) 12/2021   Right breast ILC   Carotid artery plaque, bilateral 10/2015   Mild to moderate plaque L>R without significant stenosis   COPD (chronic obstructive pulmonary disease) (HCC)    Coronary Artery Calcification - Score 79    Coronary Calcium  Score 79.  LAD and circumflex calcification noted.  Normal ascending aorta with mild calcification.   Current every day smoker    pt quit smoking April 2023   Dysrhythmia 01/2023   developed post op a-fib after AAA repair, converted with amiodarone    Emphysema lung (HCC)    Noted on chest CT   Family history of breast cancer 02/21/2022   Family history of prostate cancer 02/21/2022   Hyperlipidemia    Hypertension    Controlled with amlodipine    Pre-diabetes    hx , no meds   Skin cancer 2021   Remove 2022 on left shin   I have reviewed the social history and family history with the patient and they are unchanged from previous note.  ALLERGIES:  is allergic to zestril  [lisinopril ] and lipitor [atorvastatin].  MEDICATIONS:  Current Outpatient Medications  Medication Sig Dispense Refill   acetaminophen  (TYLENOL ) 325 MG tablet Take 2 tablets (650 mg total) by mouth every 6 (six) hours as needed for mild pain (pain score 1-3), fever or headache.     albuterol  (PROVENTIL  HFA) 108 (90 Base) MCG/ACT inhaler Inhale 1 to 2 puffs into  the lungs every 6 hours as needed 8.5 g 2   alendronate  (FOSAMAX ) 70 MG tablet Take 1 tablet (70 mg total) by mouth once a week. Take with plain water 30 minutes before the first food, beverage, or medicine of the day. (Patient taking differently: Take 70 mg by mouth every Sunday. Take with plain water 30 minutes before the first food, beverage, or medicine of the day.) 12 tablet 0   amiodarone  (PACERONE ) 200 MG tablet Take 1 tablet (200 mg total) by mouth daily. 90 tablet 3   amLODipine  (NORVASC ) 10 MG tablet Take 1 tablet (10 mg total) by mouth at bedtime. 90 tablet 3    anastrozole  (ARIMIDEX ) 1 MG tablet Take 1 tablet (1 mg) by mouth daily. (Patient taking differently: Take 1 mg by mouth at bedtime.) 90 tablet 3   apixaban  (ELIQUIS ) 5 MG TABS tablet Take 1 tablet (5 mg total) by mouth 2 (two) times daily. 180 tablet 3   Calcium  Carb-Cholecalciferol  (CALCIUM  + VITAMIN D3 PO) Take 1 tablet by mouth at bedtime.     carvedilol  (COREG ) 6.25 MG tablet Take 1 tablet (6.25 mg total) by mouth 2 (two) times daily. 180 tablet 3   Cholecalciferol  (VITAMIN D -3 PO) Take 1 tablet by mouth at bedtime.     ezetimibe  (ZETIA ) 10 MG tablet Take 1 tablet (10 mg total) by mouth daily. (Patient taking differently: Take 10 mg by mouth at bedtime.) 90 tablet 2   FLUoxetine  (PROZAC ) 20 MG capsule Take 1 capsule (20 mg total) by mouth daily. (Patient taking differently: Take 20 mg by mouth at bedtime.) 90 capsule 3   Fluticasone -Umeclidin-Vilant (TRELEGY ELLIPTA ) 200-62.5-25 MCG/ACT AEPB Inhale 1 puff into the lungs daily. 60 each 2   Prenatal Vit-Fe Fumarate-FA (PRENATAL PO) Take 1 tablet by mouth at bedtime.     rosuvastatin  (CRESTOR ) 5 MG tablet Take 1 tablet by mouth daily at bedtime ,except on Monday, Wednesday and Fridays take 2 tablets at bedtime as directed 180 tablet 3   [Paused] [name]  Maleate (NERLYNX ) 40 MG tablet Take 6 tablets (240 mg total) by mouth daily. Take with food. (Patient not taking: Reported on 05/06/2024) 180 tablet 4   No current facility-administered medications for this visit.    PHYSICAL EXAMINATION: ECOG PERFORMANCE STATUS: 0 - Asymptomatic  Vitals:   05/06/24 0920  BP: (!) 142/73  Pulse: (!) 52  Resp: 16  Temp: 97.7 F (36.5 C)  SpO2: 95%   Wt Readings from Last 3 Encounters:  05/06/24 160 lb 9.6 oz (72.8 kg)  05/05/24 159 lb 3.2 oz (72.2 kg)  04/29/24 156 lb (70.8 kg)     GENERAL:alert, no distress and comfortable SKIN: skin color, texture, turgor are normal, no rashes or significant lesions except for multiple ecchymosis on her  forearms. EYES: normal, Conjunctiva are pink and non-injected, sclera clear NECK: supple, thyroid  normal size, non-tender, without nodularity LUNGS: clear to auscultation and percussion with normal breathing effort HEART: regular rate & rhythm and no murmurs and no lower extremity edema Musculoskeletal:no cyanosis of digits and no clubbing  NEURO: alert & oriented x 3 with fluent speech, no focal motor/sensory deficits Breast exam showed healing skin graft.    LABORATORY DATA:  I have reviewed the data as listed    Latest Ref Rng & Units 05/06/2024    8:13 AM 03/24/2024   10:35 AM 03/14/2024    7:25 AM  CBC  WBC 4.0 - 10.5 K/uL 3.8  6.2  7.9   Hemoglobin 12.0 -  15.0 g/dL 44.0  10.2  72.5   Hematocrit 36.0 - 46.0 % 40.4  34.7  30.7   Platelets 150 - 400 K/uL 154  221  268         Latest Ref Rng & Units 05/06/2024    8:13 AM 04/29/2024    8:34 AM 03/24/2024   10:35 AM  CMP  Glucose 70 - 99 mg/dL 366  440  347   BUN 8 - 23 mg/dL 21  15  9    Creatinine 0.44 - 1.00 mg/dL 4.25  9.56  3.87   Sodium 135 - 145 mmol/L 140  136  138   Potassium 3.5 - 5.1 mmol/L 4.3  4.9  4.5   Chloride 98 - 111 mmol/L 104  99  104   CO2 22 - 32 mmol/L 30  22  31    Calcium  8.9 - 10.3 mg/dL 9.1  9.0  9.1   Total Protein 6.5 - 8.1 g/dL 7.3  6.8  6.8   Total Bilirubin 0.0 - 1.2 mg/dL 0.4  0.4  0.5   Alkaline Phos 38 - 126 U/L 114  139  119   AST 15 - 41 U/L 26  37  24   ALT 0 - 44 U/L 21  27  19        RADIOGRAPHIC STUDIES: I have personally reviewed the radiological images as listed and agreed with the findings in the report. No results found.   ASSESSMENT AND PLAN: MARTITA BRUMM is a 68 y.o. female who presents to the clinic for continued management of stage III right breast invasive lobular carcinoma, ER+/PR-/HER2+, diagnosed in 01/2022, treated with neoadjuvant chemotherapy, R mastectomy, adjuvant Kadcyla  and [name] , adjuvant radiation therapy, and anti-estrogen therapy with Anastrozole  beginning  in 12/2022.   #Stage III right breast cancer: --Currently on Anastrozole  and has held Nerlynx  after developing hypokalemia.  --Labs from today were reviewed and require no intervention. WBC 3.8, Hgb 13.3, MCV 97.8, Plt 154, Creatinine and LFTs are normal range.  --No dose limiting toxicities identified so recommend to continue anastrozole  and resume Nerlynx .   #Necrotizing soft tissue infection of right chest wall: --S/p Debridement 03/12/24 by Dr. Delane Fear; plastic surgeon is Dr. Marieta Shorten --Underwent skin graft from right thigh to right chest wall on 04/13/2024, healing well.    FOLLOW UP: --RTC on 06/10/2024 with labs follow up with Dr. Maryalice Smaller  No orders of the defined types were placed in this encounter.  All questions were answered. The patient knows to call the clinic with any problems, questions or concerns. No barriers to learning was detected.   I have spent a total of 25 minutes minutes of face-to-face and non-face-to-face time, preparing to see the patient,  performing a medically appropriate examination, counseling and educating the patient, ordering medications/tests/procedures, rdocumenting clinical information in the electronic health record, independently interpreting results and communicating results to the patient, and care coordination.   Wyline Hearing PA-C Dept of Hematology and Oncology [facility] at [facility] Phone: [phone]

## 2024-05-15 ENCOUNTER — Ambulatory Visit: Admitting: Cardiology

## 2024-05-19 ENCOUNTER — Other Ambulatory Visit (HOSPITAL_COMMUNITY): Payer: Self-pay

## 2024-05-19 DIAGNOSIS — J439 Emphysema, unspecified: Secondary | ICD-10-CM | POA: Diagnosis not present

## 2024-05-19 DIAGNOSIS — I480 Paroxysmal atrial fibrillation: Secondary | ICD-10-CM | POA: Diagnosis not present

## 2024-05-19 DIAGNOSIS — C50919 Malignant neoplasm of unspecified site of unspecified female breast: Secondary | ICD-10-CM | POA: Diagnosis not present

## 2024-05-19 DIAGNOSIS — M810 Age-related osteoporosis without current pathological fracture: Secondary | ICD-10-CM | POA: Diagnosis not present

## 2024-05-19 DIAGNOSIS — Z6824 Body mass index (BMI) 24.0-24.9, adult: Secondary | ICD-10-CM | POA: Diagnosis not present

## 2024-05-19 DIAGNOSIS — Z23 Encounter for immunization: Secondary | ICD-10-CM | POA: Diagnosis not present

## 2024-05-19 DIAGNOSIS — M8588 Other specified disorders of bone density and structure, other site: Secondary | ICD-10-CM | POA: Diagnosis not present

## 2024-05-19 DIAGNOSIS — E7800 Pure hypercholesterolemia, unspecified: Secondary | ICD-10-CM | POA: Diagnosis not present

## 2024-05-19 DIAGNOSIS — Z0000 Encounter for general adult medical examination without abnormal findings: Secondary | ICD-10-CM | POA: Diagnosis not present

## 2024-05-19 DIAGNOSIS — F331 Major depressive disorder, recurrent, moderate: Secondary | ICD-10-CM | POA: Diagnosis not present

## 2024-05-19 DIAGNOSIS — I10 Essential (primary) hypertension: Secondary | ICD-10-CM | POA: Diagnosis not present

## 2024-05-19 MED ORDER — EZETIMIBE 10 MG PO TABS
10.0000 mg | ORAL_TABLET | Freq: Every day | ORAL | 4 refills | Status: AC
  Filled 2024-05-19 – 2024-07-21 (×2): qty 90, 90d supply, fill #0
  Filled 2024-10-25: qty 90, 90d supply, fill #1

## 2024-05-19 MED ORDER — ALENDRONATE SODIUM 70 MG PO TABS
70.0000 mg | ORAL_TABLET | ORAL | 4 refills | Status: AC
  Filled 2024-05-19: qty 12, 84d supply, fill #0
  Filled 2024-08-21: qty 12, 84d supply, fill #1
  Filled 2024-11-21: qty 12, 84d supply, fill #2

## 2024-05-19 MED ORDER — FLUOXETINE HCL 20 MG PO CAPS
20.0000 mg | ORAL_CAPSULE | Freq: Every day | ORAL | 4 refills | Status: AC
  Filled 2024-05-19 – 2024-06-19 (×2): qty 90, 90d supply, fill #0
  Filled 2024-09-07: qty 90, 90d supply, fill #1
  Filled 2024-12-11: qty 90, 90d supply, fill #2

## 2024-05-20 ENCOUNTER — Other Ambulatory Visit: Payer: Self-pay

## 2024-05-20 ENCOUNTER — Other Ambulatory Visit (HOSPITAL_COMMUNITY): Payer: Self-pay

## 2024-05-20 ENCOUNTER — Other Ambulatory Visit: Payer: Self-pay | Admitting: Hematology

## 2024-05-20 DIAGNOSIS — I10 Essential (primary) hypertension: Secondary | ICD-10-CM | POA: Diagnosis not present

## 2024-05-20 DIAGNOSIS — I2510 Atherosclerotic heart disease of native coronary artery without angina pectoris: Secondary | ICD-10-CM | POA: Diagnosis not present

## 2024-05-20 DIAGNOSIS — J439 Emphysema, unspecified: Secondary | ICD-10-CM | POA: Diagnosis not present

## 2024-05-20 MED ORDER — ANASTROZOLE 1 MG PO TABS
1.0000 mg | ORAL_TABLET | Freq: Every day | ORAL | 3 refills | Status: AC
  Filled 2024-05-20: qty 90, 90d supply, fill #0
  Filled 2024-08-21: qty 90, 90d supply, fill #1
  Filled 2024-11-21: qty 90, 90d supply, fill #2

## 2024-05-21 ENCOUNTER — Other Ambulatory Visit: Payer: Self-pay

## 2024-05-21 ENCOUNTER — Other Ambulatory Visit (HOSPITAL_COMMUNITY): Payer: Self-pay

## 2024-05-21 NOTE — Progress Notes (Signed)
 Specialty Pharmacy Ongoing Clinical Assessment Note  Ashley Robertson is a 68 y.o. female who is being followed by the specialty pharmacy service for RxSp Oncology   Patient's specialty medication(s) reviewed today: [name]  Maleate (NERLYNX )   Missed doses in the last 4 weeks: 0 (medication was previously on hold, patient restarted on 6/04 and reports no missed doses since restarting)   Patient/Caregiver did not have any additional questions or concerns.   Therapeutic benefit summary: Patient is achieving benefit   Adverse events/side effects summary: No adverse events/side effects   Patient's therapy is appropriate to: Continue    Goals Addressed             This Visit's Progress    Achieve or maintain remission   On track    Patient is on track. Patient will maintain adherence.  Ms. Vahey is stable at this time.          Follow up: 3 months  Malachi Screws Specialty Pharmacist

## 2024-05-21 NOTE — Progress Notes (Signed)
 Specialty Pharmacy Refill Coordination Note  Spoke with Shi, Grose (Self).   MAEGHAN CANNY is a 68 y.o. female contacted today regarding refills of specialty medication(s) [name]  Maleate (NERLYNX )  Patient requested: [facility] at [facility] at [place] date: 05/22/24  Medication will be filled on 05/22/24.  Patient is aware that medication will have to be ordered and should be ready after 2pm.

## 2024-05-22 ENCOUNTER — Telehealth: Payer: Self-pay

## 2024-05-22 ENCOUNTER — Other Ambulatory Visit: Payer: Self-pay

## 2024-05-22 NOTE — Telephone Encounter (Signed)
 That is fine if she wants to cancel the follow-up and prefers to call back if needed

## 2024-05-22 NOTE — Telephone Encounter (Signed)
 Copied from CRM [id number]. Topic: Appointments - Scheduling Inquiry for Clinic >> May 21, 2024 10:58 AM Ashley Robertson wrote: Reason for CRM: Pt was calling due to a reminder for her 6 mo f/u appt with Dr. Waylan Haggard. Pt stated she doesn't have any symptoms and doesn't feel necessarily that she needs an appt right now. I took a look at the schedule for the pt, but there were no openings available with Dr. Waylan Haggard. I asked the pt if she was open to seeing a member of his care team, but the pt insisted that if she needed an appt she would call back.   If you feel this patient needs to be seen, please call the pt back at [phone].  ATC x1 left a detailed message to let patient know per Dr.Mannam   That is fine if she wants to cancel the follow-up and prefers to call back if needed   Will send patient a mychart message .

## 2024-06-01 ENCOUNTER — Other Ambulatory Visit (HOSPITAL_COMMUNITY): Payer: Self-pay

## 2024-06-01 DIAGNOSIS — E7800 Pure hypercholesterolemia, unspecified: Secondary | ICD-10-CM | POA: Diagnosis not present

## 2024-06-01 DIAGNOSIS — I10 Essential (primary) hypertension: Secondary | ICD-10-CM | POA: Diagnosis not present

## 2024-06-01 DIAGNOSIS — C50919 Malignant neoplasm of unspecified site of unspecified female breast: Secondary | ICD-10-CM | POA: Diagnosis not present

## 2024-06-01 DIAGNOSIS — I2510 Atherosclerotic heart disease of native coronary artery without angina pectoris: Secondary | ICD-10-CM | POA: Diagnosis not present

## 2024-06-01 DIAGNOSIS — J439 Emphysema, unspecified: Secondary | ICD-10-CM | POA: Diagnosis not present

## 2024-06-01 DIAGNOSIS — F339 Major depressive disorder, recurrent, unspecified: Secondary | ICD-10-CM | POA: Diagnosis not present

## 2024-06-01 NOTE — Progress Notes (Signed)
 LVM. RTS Nerlynx . Medication has been ready for pick up since 6.20.25

## 2024-06-02 ENCOUNTER — Other Ambulatory Visit: Payer: Self-pay

## 2024-06-02 ENCOUNTER — Other Ambulatory Visit (HOSPITAL_COMMUNITY): Payer: Self-pay

## 2024-06-02 NOTE — Progress Notes (Signed)
 Patient called back and will pick up Nerlynx  tomorrow. Reprocessed at [facility]. Retiming next refill call accordingly.

## 2024-06-04 ENCOUNTER — Other Ambulatory Visit: Payer: Self-pay

## 2024-06-09 ENCOUNTER — Other Ambulatory Visit: Payer: Self-pay

## 2024-06-09 NOTE — Assessment & Plan Note (Signed)
 invasive lobular carcinoma, Stage IIIA, c(T3, N1), yp(T3, N1a), ER+/PR-/HER2+, Grade 2  -Diagnosed in 01/2022 -s/p neoadjuvant chemo TCHP X6, followed by right mastectomy by Dr. Ebbie and reconstruction by Dr. [name]. Unfortunately she did not have much response to neoadjvuant chemo  -on adjuvant Kadcyla  now, tolerating well with no noticeable side effects. Plan for a total of 14 treatment -she received postmastectomy radiation under Dr. Dewey, 10/10 - 10/29/22. -she started adjuvant anastrozole  in January 2024, she is tolerating very well, will continue for 7 years if she tolerates  -Echocardiogram in January 2024 showed slightly decreased EF 50 to 55%.  She was seen by cardiologist after echo, her cardiac medication was adjusted, and we will continue HER2 antibody.  repeated echo on 03/18/23 showed normal EF 55%. She finished Kadcyla  in 06/2023

## 2024-06-10 ENCOUNTER — Inpatient Hospital Stay (HOSPITAL_BASED_OUTPATIENT_CLINIC_OR_DEPARTMENT_OTHER): Admitting: Hematology

## 2024-06-10 ENCOUNTER — Other Ambulatory Visit: Payer: Self-pay

## 2024-06-10 ENCOUNTER — Encounter: Payer: Self-pay | Admitting: Hematology

## 2024-06-10 ENCOUNTER — Inpatient Hospital Stay: Attending: Hematology

## 2024-06-10 VITALS — BP 150/66 | HR 50 | Temp 98.4°F | Resp 15 | Ht 69.0 in | Wt 163.4 lb

## 2024-06-10 DIAGNOSIS — C50811 Malignant neoplasm of overlapping sites of right female breast: Secondary | ICD-10-CM | POA: Insufficient documentation

## 2024-06-10 DIAGNOSIS — Z9221 Personal history of antineoplastic chemotherapy: Secondary | ICD-10-CM | POA: Diagnosis not present

## 2024-06-10 DIAGNOSIS — Z1731 Human epidermal growth factor receptor 2 positive status: Secondary | ICD-10-CM | POA: Diagnosis not present

## 2024-06-10 DIAGNOSIS — M8580 Other specified disorders of bone density and structure, unspecified site: Secondary | ICD-10-CM | POA: Diagnosis not present

## 2024-06-10 DIAGNOSIS — Z170 Estrogen receptor positive status [ER+]: Secondary | ICD-10-CM | POA: Insufficient documentation

## 2024-06-10 DIAGNOSIS — Z1722 Progesterone receptor negative status: Secondary | ICD-10-CM | POA: Diagnosis not present

## 2024-06-10 DIAGNOSIS — Z79811 Long term (current) use of aromatase inhibitors: Secondary | ICD-10-CM | POA: Insufficient documentation

## 2024-06-10 DIAGNOSIS — C773 Secondary and unspecified malignant neoplasm of axilla and upper limb lymph nodes: Secondary | ICD-10-CM | POA: Insufficient documentation

## 2024-06-10 DIAGNOSIS — C50911 Malignant neoplasm of unspecified site of right female breast: Secondary | ICD-10-CM

## 2024-06-10 LAB — CBC WITH DIFFERENTIAL (CANCER CENTER ONLY)
Abs Immature Granulocytes: 0.01 K/uL (ref 0.00–0.07)
Basophils Absolute: 0 K/uL (ref 0.0–0.1)
Basophils Relative: 1 %
Eosinophils Absolute: 0.3 K/uL (ref 0.0–0.5)
Eosinophils Relative: 6 %
HCT: 40 % (ref 36.0–46.0)
Hemoglobin: 13.5 g/dL (ref 12.0–15.0)
Immature Granulocytes: 0 %
Lymphocytes Relative: 14 %
Lymphs Abs: 0.6 K/uL — ABNORMAL LOW (ref 0.7–4.0)
MCH: 32.4 pg (ref 26.0–34.0)
MCHC: 33.8 g/dL (ref 30.0–36.0)
MCV: 95.9 fL (ref 80.0–100.0)
Monocytes Absolute: 0.5 K/uL (ref 0.1–1.0)
Monocytes Relative: 12 %
Neutro Abs: 3.1 K/uL (ref 1.7–7.7)
Neutrophils Relative %: 67 %
Platelet Count: 129 K/uL — ABNORMAL LOW (ref 150–400)
RBC: 4.17 MIL/uL (ref 3.87–5.11)
RDW: 14.7 % (ref 11.5–15.5)
WBC Count: 4.5 K/uL (ref 4.0–10.5)
nRBC: 0 % (ref 0.0–0.2)

## 2024-06-10 LAB — CMP (CANCER CENTER ONLY)
ALT: 18 U/L (ref 0–44)
AST: 26 U/L (ref 15–41)
Albumin: 3.9 g/dL (ref 3.5–5.0)
Alkaline Phosphatase: 108 U/L (ref 38–126)
Anion gap: 5 (ref 5–15)
BUN: 25 mg/dL — ABNORMAL HIGH (ref 8–23)
CO2: 29 mmol/L (ref 22–32)
Calcium: 9.3 mg/dL (ref 8.9–10.3)
Chloride: 105 mmol/L (ref 98–111)
Creatinine: 0.75 mg/dL (ref 0.44–1.00)
GFR, Estimated: >60 mL/min (ref >60)
Glucose, Bld: 111 mg/dL — ABNORMAL HIGH (ref 70–99)
Potassium: 4.7 mmol/L (ref 3.5–5.1)
Sodium: 139 mmol/L (ref 135–145)
Total Bilirubin: 0.4 mg/dL (ref 0.0–1.2)
Total Protein: 7 g/dL (ref 6.5–8.1)

## 2024-06-10 NOTE — Progress Notes (Signed)
 [facility]   [phone] [phone]   Clinic Follow up Note   Patient Care Team: [name], MD as PCP - General (Family Medicine) [name] ORN, MD as PCP - Cardiology (Cardiology) [name], MD as PCP - Electrophysiology (Cardiology) [name], RN (Inactive) as Oncology Nurse Navigator [name], RN as Oncology Nurse Navigator [name], MD as Consulting Physician (General Surgery) [name], MD as Consulting Physician (Hematology) Dewey Rush, MD as Consulting Physician (Radiation Oncology) Burton, Lacie K, NP as Nurse Practitioner (Nurse Practitioner)  Date of Service:  06/10/2024  CHIEF COMPLAINT: f/u of breast cancer  CURRENT THERAPY:  Adjuvant anastrozole  and Nerlynx   Oncology History   Malignant neoplasm of overlapping sites of right breast in female, estrogen receptor positive (HCC) invasive lobular carcinoma, Stage IIIA, c(T3, N1), yp(T3, N1a), ER+/PR-/HER2+, Grade 2  -Diagnosed in 01/2022 -s/p neoadjuvant chemo TCHP X6, followed by right mastectomy by Dr. Ebbie and reconstruction by Dr. [name]. Unfortunately she did not have much response to neoadjvuant chemo  -She completed adjuvant Kadcyla  on 06/20/2023 -she received postmastectomy radiation under Dr. Dewey, 10/10 - 10/29/22. -she started adjuvant anastrozole  in January 2024, she is tolerating very well, will continue for 7 years if she tolerates  -Echocardiogram in January 2024 showed slightly decreased EF 50 to 55%.  She was seen by cardiologist after echo, her cardiac medication was adjusted, and we will continue HER2 antibody.  repeated echo on 03/18/23 showed normal EF 55%.  -she has been on adjuvant neratinib  since August 2024, she held it for about 2 months in 2025 due to her surgeries, plan to compete in October 2025. -Continue breast cancer surveillance, Signatera was negative in May 2025, will continue every 6 months.  Assessment &  Plan Breast cancer -She is clinically doing well, tolerating anastrozole  and neratinib  well - Continue neratinib  until the end of October this year - Continue anastrozole  - Follow-up with cardiologist, she is scheduled for echocardiogram in August - Last screening mammogram in January 2025 was benign  Osteopenia - She is on Fosamax , and continue calcium  and vitamin D  supplement - Next bone density scan in April 2026  Plan - She is clinically doing well, will continue anastrozole  and neratinib  - Follow-up in 2 months     SUMMARY OF ONCOLOGIC HISTORY: Oncology History Overview Note   Cancer Staging  Malignant neoplasm of overlapping sites of right breast in female, estrogen receptor positive (HCC) Staging form: Breast, AJCC 8th Edition - Clinical stage from 02/09/2022: Stage IIA (cT2, cN1, cM0, G2, ER+, PR-, HER2+) - Signed by [name], MD on 02/20/2022    Malignant neoplasm of overlapping sites of right breast in female, estrogen receptor positive (HCC)  02/08/2022 Mammogram   CLINICAL DATA:  Acute onset right breast lump.  EXAM: DIGITAL DIAGNOSTIC UNILATERAL RIGHT MAMMOGRAM WITH TOMOSYNTHESIS AND CAD; ULTRASOUND RIGHT BREAST LIMITED  IMPRESSION: 1. Highly suspicious findings mammographically and sonographically. I suspect there is significant malignancy throughout most of the right breast. Discrete masses are seen at 11 o'clock and 6 o'clock.  A single borderline lymph node is identified. This lymph node appears prominent compared to the remainder of the lymph nodes. The skin thickening suggests the possibility of inflammatory breast cancer.   02/09/2022 Cancer Staging   Staging form: Breast, AJCC 8th Edition - Clinical stage from 02/09/2022: Stage IIIA (cT3, cN1, cM0, G2, ER+, PR-, HER2+) - Signed by [name], MD on 03/05/2022 Stage prefix: Initial diagnosis Histologic grading system: 3  grade system   02/09/2022 Initial Biopsy   Diagnosis 1. Breast, right, needle core biopsy,  right breast 11 o'clock mass ribbon clip - INVASIVE MAMMARY CARCINOMA - SEE COMMENT 2. Breast, right, needle core biopsy, right breast 6'oclock mass, coil clip - INVASIVE MAMMARY CARCINOMA - SEE COMMENT 3. Lymph node, needle/core biopsy, right axillary lymph node, tribell clip - METASTATIC CARCINOMA INVOLVING NODAL TISSUE Microscopic Comment 1. and 2. The biopsy material shows an infiltrative proliferation of cells arranged linearly and in small clusters. Based on the biopsy, the carcinoma appears Nottingham grade 2 of 3 and measures 1.5 cm in greatest linear extent.  Addendum parts 1 and 2: Immunohistochemistry for E-cadherin is negative consistent with lobular carcinoma.  2. PROGNOSTIC INDICATORS Results: The tumor cells are EQUIVOCAL for Her2 (2+). Her2 by FISH will be performed and the results reported separately. Estrogen Receptor: 100%, POSITIVE, STRONG STAINING INTENSITY Progesterone Receptor: <1%, NEGATIVE Proliferation Marker Ki67: 10%  2. FLUORESCENCE IN-SITU HYBRIDIZATION Results: GROUP 1: HER2 **POSITIVE**   3. PROGNOSTIC INDICATORS Results: By immunohistochemistry, the tumor cells are POSITIVE for Her2 (3+). Estrogen Receptor: 100%, POSITIVE, STRONG STAINING INTENSITY Progesterone Receptor: <1%, NEGATIVE   02/16/2022 Initial Diagnosis   Malignant neoplasm of overlapping sites of right breast in female, estrogen receptor positive (HCC)   03/01/2022 Imaging   EXAM: BILATERAL BREAST MRI WITH AND WITHOUT CONTRAST  IMPRESSION: 1. 8.5 x 8.3 x 5.9 cm area of confluent mass-like enhancement in the right breast involving all 4 quadrants and containing 2 biopsy marker clip artifacts, compatible with 4 quadrant biopsy-proven malignancy. 2. Biopsy-proven metastatic lymph node in the right axilla. 3. Possible metastatic intramammary lymph node in the posterior outer right breast just above the level of the nipple. 4. No evidence of malignancy on the left.   03/01/2022  Genetic Testing   Negative hereditary cancer genetic testing: no pathogenic variants detected in [employer] CustomNext-Cancer +RNAinsight Panel.  Variant of uncertain significance reported in BRIP1 at p.F934V (c.2800T>G). Report date is March 01, 2022.    The CustomNext-Cancer+RNAinsight panel offered by [employer] includes sequencing and rearrangement analysis for the following 47 genes:  APC, ATM, AXIN2, BARD1, BMPR1A, BRCA1, BRCA2, BRIP1, CDH1, CDK4, CDKN2A, CHEK2, DICER1, EPCAM, GREM1, HOXB13, MEN1, MLH1, MSH2, MSH3, MSH6, MUTYH, NBN, NF1, NF2, NTHL1, PALB2, PMS2, POLD1, POLE, PTEN, RAD51C, RAD51D, RECQL, RET, SDHA, SDHAF2, SDHB, SDHC, SDHD, SMAD4, SMARCA4, STK11, TP53, TSC1, TSC2, and VHL.  RNA data is routinely analyzed for use in variant interpretation for all genes.  UPDATE: BRIP1 p.F934V VUS has been amended to Likely Benign. Amended report date is 11/06/2022.    03/05/2022 Imaging   EXAM: CT CHEST, ABDOMEN, AND PELVIS WITH CONTRAST  IMPRESSION: 1. Asymmetric, masslike density of the glandular tissue of the right breast with overlying skin thickening, in keeping with known primary breast malignancy. 2. Enlarged, ill-defined right axillary lymph node containing a biopsy marking clip, consistent with known nodal metastatic disease. 3. No other evidence of lymphadenopathy or metastatic disease in the chest, abdomen, or pelvis. 4. Emphysema. Background of fine centrilobular nodularity, most concentrated in the lung apices, consistent with smoking-related respiratory bronchiolitis. 5. Aneurysm of the infrarenal abdominal aorta measuring up to 5.4 x 5.3 cm with a large burden of eccentric mural thrombus. Recommend follow-up CT/MR every 6 months and vascular consultation. This recommendation follows ACR consensus guidelines: White Paper of the ACR Incidental Findings Committee II on Vascular Findings. J Am Coll Radiol 2013; 10:789-794. 6. Coronary artery disease.   03/05/2022 Imaging    EXAM:  NUCLEAR MEDICINE WHOLE BODY BONE SCAN  IMPRESSION: No evidence of bony metastatic disease.   03/07/2022 - 08/01/2022 Chemotherapy   Patient is on Treatment Plan : BREAST  Docetaxel  + Carboplatin  + Trastuzumab  + Pertuzumab   (TCHP) q21d      07/26/2022 Surgery   Right mastectomy with axillary node seed guided excision converted to axillary node dissection with reconstruction by Drs. Ebbie and Thimmappa   07/26/2022 Pathology Results   FINAL MICROSCOPIC DIAGNOSIS:   A. LYMPH NODE, RIGHT AXILLARY TARGETED, EXCISION:  - Metastatic carcinoma involving one lymph node, 2 cm (1/1).  - Focal extranodal extension.  - Biopsy site and biopsy clip.   B. BREAST, RIGHT, MASTECTOMY:  - Invasive lobular carcinoma, 9.5 cm (ypT3).  - Carcinoma involves dermis of nipple.  - All surgical margins negative for carcinoma.  - One lymph node negative for metastatic carcinoma (0/1).  - Biopsy sites and biopsy clips.  - See oncology table.   C. LYMPH NODE, RIGHT AXILLARY, SENTINEL, EXCISION:  - One lymph node negative for metastatic carcinoma (0/1).   D. LYMPH NODE, RIGHT AXILLARY, SENTINEL, EXCISION:  - Metastatic carcinoma in one lymph node, 0.5 cm. (1/1).   E. LYMPH NODE, RIGHT AXILLARY, SENTINEL, EXCISION:  - One lymph node negative for metastatic carcinoma (0/1).    07/26/2022 Cancer Staging   Cancer Staging  Malignant neoplasm of overlapping sites of right breast in female, estrogen receptor positive (HCC) Staging form: Breast, AJCC 8th Edition - Clinical stage from 02/09/2022: Stage IIIA (cT3, cN1, cM0, G2, ER+, PR-, HER2+) - Signed by [name], MD on 03/05/2022 Stage prefix: Initial diagnosis Histologic grading system: 3 grade system - Pathologic stage from 07/26/2022: Stage IIIA (pT3, pN1a, cM0, G2, ER+, PR-, HER2+) - Signed by Ann Mayme POUR, NP on 08/01/2022 Histologic grading system: 3 grade system    07/26/2022 Cancer Staging   Staging form: Breast, AJCC 8th Edition - Pathologic  stage from 07/26/2022: Stage IIIA (pT3, pN1a, cM0, G2, ER+, PR-, HER2+) - Signed by Burton, Lacie K, NP on 08/01/2022 Histologic grading system: 3 grade system   08/24/2022 -  Chemotherapy   Patient is on Treatment Plan : BREAST ADO-Trastuzumab Emtansine  (Kadcyla ) q21d     07/24/2023 Imaging   Echocardiogram  Normal LVF at 55-60 % Normal left diastolic function. Normal right ventricle  Normal visualized valves with preserved function   03/24/2024 Survivorship   SCP delivered by Lacie Burton, NP      Discussed the use of AI scribe software for clinical note transcription with the patient, who gave verbal consent to proceed.  History of Present Illness Ashley Robertson is here to follow-up of her breast cancer.  She was last seen by NP on May 06, 2024.  She is clinically doing well, denies any further issues with the right chest wall, she has healed from previous surgery.  She is tolerating anastrozole  and neratinib  well, no diarrhea or other side effects.  No other new complaints.     All other systems were reviewed with the patient and are negative.  MEDICAL HISTORY:  Past Medical History:  Diagnosis Date   AAA (abdominal aortic aneurysm) (HCC) 01/11/2023   Allergy    seasonal - on claritin   Anxiety and depression    Aortic atherosclerosis (HCC) 03/2020   CT Chest: 2 V (LAD & LCx) Coronary Atherosclerosis, Aortic Atherosclerosis (no aneurysm).  Mild centrilobular emphysema with mild diffuse bronchial thickening; several scattered small solitary pulmonary nodules (largest 5.6 mm in anterior left upper lobe)  Breast cancer (HCC) 12/2021   Right breast ILC   Carotid artery plaque, bilateral 10/2015   Mild to moderate plaque L>R without significant stenosis   COPD (chronic obstructive pulmonary disease) (HCC)    Coronary Artery Calcification - Score 79    Coronary Calcium  Score 79.  LAD and circumflex calcification noted.  Normal ascending aorta with mild calcification.   Current every day  smoker    pt quit smoking April 2023   Dysrhythmia 01/2023   developed post op a-fib after AAA repair, converted with amiodarone    Emphysema lung (HCC)    Noted on chest CT   Family history of breast cancer 02/21/2022   Family history of prostate cancer 02/21/2022   Hyperlipidemia    Hypertension    Controlled with amlodipine    Pre-diabetes    hx , no meds   Skin cancer 2021   Remove 2022 on left shin    SURGICAL HISTORY: Past Surgical History:  Procedure Laterality Date   ABDOMINAL AORTIC ANEURYSM REPAIR N/A 01/11/2023   Procedure: ANEURYSM ABDOMINAL AORTIC REPAIR;  Surgeon: Serene Gaile ORN, MD;  Location: [facility] OR;  Service: Vascular;  Laterality: N/A;   BREAST BIOPSY Right    times 3   BREAST RECONSTRUCTION WITH PLACEMENT OF TISSUE EXPANDER AND ALLODERM Right 07/26/2022   Procedure: RIGHT BREAST RECONSTRUCTION WITH PLACEMENT OF TISSUE EXPANDER AND ALLODERM;  Surgeon: [name] Filippo, MD;  Location: [facility] OR;  Service: Plastics;  Laterality: Right;   CESAREAN SECTION  1990, 1991   COSMETIC SURGERY  1981   Rhinoplasty   INCISION AND DRAINAGE OF WOUND Right 03/12/2024   Procedure: IRRIGATION AND DEBRIDEMENT WOUND;  Surgeon: [name], MD;  Location[facility] OR;  Service: General;  Laterality: Right;  DEBRIDEMENT RIGHT CHEST WALL   MASTECTOMY Right    MASTECTOMY W/ SENTINEL NODE BIOPSY Right 07/26/2022   Procedure: RIGHT MASTECTOMY, RIGHT AXILLARY SENTINEL NODE BIOPSY;  Surgeon: [name], MD;  Location: [facility] OR;  Service: General;  Laterality: Right;  GEN & PEC BLOCK   MASTOPEXY Left 12/20/2023   Procedure: MASTOPEXY;  Surgeon: [name] Filippo, MD;  Location: [facility];  Service: Plastics;  Laterality: Left;   PORT-A-CATH REMOVAL Left 12/20/2023   Procedure: REMOVAL PORT-A-CATH;  Surgeon: [name] Filippo, MD;  Location: [facility];  Service: Plastics;  Laterality: Left;   PORTACATH PLACEMENT Left 03/06/2022   Procedure: INSERTION  PORT-A-CATH;  Surgeon: [name], MD;  Location: [facility];  Service: General;  Laterality: Left;   RADIOACTIVE SEED GUIDED AXILLARY SENTINEL LYMPH NODE Right 07/26/2022   Procedure: RADIOACTIVE SEED GUIDED AXILLARY SENTINEL LYMPH NODE DISSECTION;  Surgeon: [name], MD;  Location: [facility] OR;  Service: General;  Laterality: Right;   REMOVAL OF TISSUE EXPANDER AND PLACEMENT OF IMPLANT Right 12/20/2023   Procedure: REMOVAL OF TISSUE EXPANDER AND PLACEMENT OF SILICONE IMPLANT;  Surgeon: [name] Filippo, MD;  Location: [facility];  Service: Plastics;  Laterality: Right;   REMOVAL OF TISSUE EXPANDER AND PLACEMENT OF IMPLANT Right 03/07/2024   Procedure: REMOVAL RIGHT CHEST IMPLANT;  Surgeon: [name] Filippo, MD;  Location: [facility] OR;  Service: Plastics;  Laterality: Right;  REMOVAL OF IMPLANT   RHINOPLASTY  1981   SKIN SPLIT GRAFT Right 04/13/2024   Procedure: APPLICATION, GRAFT, SKIN, SPLIT-THICKNESS;  Surgeon: [name] Filippo, MD;  Location: [facility] OR;  Service: Plastics;  Laterality: Right;  SPLIT THICKNESS SKIN GRAFT FROM RIGHT THIGH TO RIGHT CHEST   TONSILLECTOMY  1978   TRANSTHORACIC ECHOCARDIOGRAM  10/2016   EF 55 to 60%.  Normal systolic and diastolic function.  No ASD/PFO   TUBAL LIGATION  1991    I have reviewed the social history and family history with the patient and they are unchanged from previous note.  ALLERGIES:  is allergic to zestril  [lisinopril ] and lipitor [atorvastatin].  MEDICATIONS:  Current Outpatient Medications  Medication Sig Dispense Refill   acetaminophen  (TYLENOL ) 325 MG tablet Take 2 tablets (650 mg total) by mouth every 6 (six) hours as needed for mild pain (pain score 1-3), fever or headache.     albuterol  (PROVENTIL  HFA) 108 (90 Base) MCG/ACT inhaler Inhale 1 to 2 puffs into the lungs every 6 hours as needed 8.5 g 2   alendronate  (FOSAMAX ) 70 MG tablet Take 1 tablet (70 mg total) by mouth once a week 30 minutes before the  first food, beverage or medicine of the day with plain water 12 tablet 4   amiodarone  (PACERONE ) 200 MG tablet Take 1 tablet (200 mg total) by mouth daily. 90 tablet 3   amLODipine  (NORVASC ) 10 MG tablet Take 1 tablet (10 mg total) by mouth at bedtime. 90 tablet 3   anastrozole  (ARIMIDEX ) 1 MG tablet Take 1 tablet (1 mg) by mouth daily. 90 tablet 3   apixaban  (ELIQUIS ) 5 MG TABS tablet Take 1 tablet (5 mg total) by mouth 2 (two) times daily. 180 tablet 3   Calcium  Carb-Cholecalciferol  (CALCIUM  + VITAMIN D3 PO) Take 1 tablet by mouth at bedtime.     carvedilol  (COREG ) 6.25 MG tablet Take 1 tablet (6.25 mg total) by mouth 2 (two) times daily. 180 tablet 3   Cholecalciferol  (VITAMIN D -3 PO) Take 1 tablet by mouth at bedtime.     ezetimibe  (ZETIA ) 10 MG tablet Take 1 tablet (10 mg total) by mouth daily. (Patient taking differently: Take 10 mg by mouth at bedtime.) 90 tablet 2   ezetimibe  (ZETIA ) 10 MG tablet Take 1 tablet (10 mg total) by mouth daily. 90 tablet 4   FLUoxetine  (PROZAC ) 20 MG capsule Take 1 capsule (20 mg total) by mouth daily. 90 capsule 4   Fluticasone -Umeclidin-Vilant (TRELEGY ELLIPTA ) 200-62.5-25 MCG/ACT AEPB Inhale 1 puff into the lungs daily. 60 each 2   [name]  Maleate (NERLYNX ) 40 MG tablet Take 6 tablets (240 mg total) by mouth daily. Take with food. (Patient not taking: Reported on 05/06/2024) 180 tablet 4   Prenatal Vit-Fe Fumarate-FA (PRENATAL PO) Take 1 tablet by mouth at bedtime.     rosuvastatin  (CRESTOR ) 5 MG tablet Take 1 tablet by mouth daily at bedtime ,except on Monday, Wednesday and Fridays take 2 tablets at bedtime as directed 180 tablet 3   No current facility-administered medications for this visit.    PHYSICAL EXAMINATION: ECOG PERFORMANCE STATUS: 0 - Asymptomatic  Vitals:   06/10/24 0907 06/10/24 0908  BP: (!) 161/74 (!) 150/66  Pulse: (!) 50   Resp: 15   Temp: 98.4 F (36.9 C)   SpO2: 97%    Wt Readings from Last 3 Encounters:  06/10/24 74.1 kg  (163 lb 6.4 oz)  05/06/24 72.8 kg (160 lb 9.6 oz)  05/05/24 72.2 kg (159 lb 3.2 oz)     GENERAL:alert, no distress and comfortable SKIN: skin color, texture, turgor are normal, no rashes or significant lesions EYES: normal, Conjunctiva are pink and non-injected, sclera clear Musculoskeletal:no cyanosis of digits and no clubbing  NEURO: alert & oriented x 3 with fluent speech, no focal motor/sensory deficits  Physical Exam  LABORATORY DATA:  I have reviewed the data as listed    Latest Ref Rng & Units 06/10/2024    8:20 AM 05/06/2024    8:13 AM 03/24/2024   10:35 AM  CBC  WBC 4.0 - 10.5 K/uL 4.5  3.8  6.2   Hemoglobin 12.0 - 15.0 g/dL 86.4  86.6  88.4   Hematocrit 36.0 - 46.0 % 40.0  40.4  34.7   Platelets 150 - 400 K/uL 129  154  221         Latest Ref Rng & Units 06/10/2024    8:20 AM 05/06/2024    8:13 AM 04/29/2024    8:34 AM  CMP  Glucose 70 - 99 mg/dL 888  889  883   BUN 8 - 23 mg/dL 25  21  15    Creatinine 0.44 - 1.00 mg/dL 9.24  9.25  9.26   Sodium 135 - 145 mmol/L 139  140  136   Potassium 3.5 - 5.1 mmol/L 4.7  4.3  4.9   Chloride 98 - 111 mmol/L 105  104  99   CO2 22 - 32 mmol/L 29  30  22    Calcium  8.9 - 10.3 mg/dL 9.3  9.1  9.0   Total Protein 6.5 - 8.1 g/dL 7.0  7.3  6.8   Total Bilirubin 0.0 - 1.2 mg/dL 0.4  0.4  0.4   Alkaline Phos 38 - 126 U/L 108  114  139   AST 15 - 41 U/L 26  26  37   ALT 0 - 44 U/L 18  21  27        RADIOGRAPHIC STUDIES: I have personally reviewed the radiological images as listed and agreed with the findings in the report. No results found.    No orders of the defined types were placed in this encounter.  All questions were answered. The patient knows to call the clinic with any problems, questions or concerns. No barriers to learning was detected. The total time spent in the appointment was 25 minutes, including review of chart and various tests results, discussions about plan of care and coordination of care plan     Onita Mattock, MD 06/10/2024

## 2024-06-15 ENCOUNTER — Telehealth: Payer: Self-pay | Admitting: Hematology

## 2024-06-15 NOTE — Telephone Encounter (Signed)
 Rescheule appointment per provider pal.  Called left VM with changes made to the upcoming appointment.

## 2024-06-18 DIAGNOSIS — J439 Emphysema, unspecified: Secondary | ICD-10-CM | POA: Diagnosis not present

## 2024-06-18 DIAGNOSIS — I10 Essential (primary) hypertension: Secondary | ICD-10-CM | POA: Diagnosis not present

## 2024-06-18 DIAGNOSIS — I2510 Atherosclerotic heart disease of native coronary artery without angina pectoris: Secondary | ICD-10-CM | POA: Diagnosis not present

## 2024-06-19 ENCOUNTER — Other Ambulatory Visit: Payer: Self-pay

## 2024-06-19 ENCOUNTER — Other Ambulatory Visit (HOSPITAL_COMMUNITY): Payer: Self-pay

## 2024-06-22 ENCOUNTER — Other Ambulatory Visit: Payer: Self-pay

## 2024-06-22 ENCOUNTER — Encounter (INDEPENDENT_AMBULATORY_CARE_PROVIDER_SITE_OTHER): Payer: Self-pay

## 2024-06-23 ENCOUNTER — Other Ambulatory Visit: Payer: Self-pay

## 2024-06-23 NOTE — Progress Notes (Signed)
 Specialty Pharmacy Refill Coordination Note  Ashley Robertson is a 68 y.o. female contacted today regarding refills of specialty medication(s) [name]  Maleate (NERLYNX )   Patient requested (Patient-Rptd) Pickup at [facility] at [facility] date: (Patient-Rptd) 06/29/24   Medication will be filled on 07.25.25.

## 2024-06-26 ENCOUNTER — Other Ambulatory Visit: Payer: Self-pay

## 2024-06-29 ENCOUNTER — Other Ambulatory Visit: Payer: Self-pay | Admitting: Pulmonary Disease

## 2024-06-29 ENCOUNTER — Ambulatory Visit: Attending: Plastic Surgery

## 2024-06-29 ENCOUNTER — Other Ambulatory Visit (HOSPITAL_COMMUNITY): Payer: Self-pay

## 2024-06-29 VITALS — Wt 163.1 lb

## 2024-06-29 DIAGNOSIS — Z483 Aftercare following surgery for neoplasm: Secondary | ICD-10-CM | POA: Insufficient documentation

## 2024-06-29 MED ORDER — TRELEGY ELLIPTA 200-62.5-25 MCG/ACT IN AEPB
1.0000 | INHALATION_SPRAY | Freq: Every day | RESPIRATORY_TRACT | 2 refills | Status: DC
  Filled 2024-06-29: qty 60, 30d supply, fill #0
  Filled 2024-08-05: qty 60, 30d supply, fill #1
  Filled 2024-08-26 – 2024-08-28 (×2): qty 60, 30d supply, fill #2

## 2024-06-29 NOTE — Therapy (Signed)
 OUTPATIENT PHYSICAL THERAPY SOZO SCREENING NOTE   Patient Name: Ashley Robertson MRN: [id number] DOB:May 14, 1956, 68 y.o., female Today's Date: 06/29/2024  PCP: [name], MD REFERRING PROVIDER: [name] Filippo, MD   PT End of Session - 06/29/24 9191     Visit Number 2   # unchanged due to screen only   PT Start Time 0806    PT Stop Time 0810    PT Time Calculation (min) 4 min    Activity Tolerance Patient tolerated treatment well    Behavior During Therapy [facility] for tasks assessed/performed           Past Medical History:  Diagnosis Date   AAA (abdominal aortic aneurysm) (HCC) 01/11/2023   Allergy    seasonal - on claritin   Anxiety and depression    Aortic atherosclerosis (HCC) 03/2020   CT Chest: 2 V (LAD & LCx) Coronary Atherosclerosis, Aortic Atherosclerosis (no aneurysm).  Mild centrilobular emphysema with mild diffuse bronchial thickening; several scattered small solitary pulmonary nodules (largest 5.6 mm in anterior left upper lobe)   Breast cancer (HCC) 12/2021   Right breast ILC   Carotid artery plaque, bilateral 10/2015   Mild to moderate plaque L>R without significant stenosis   COPD (chronic obstructive pulmonary disease) (HCC)    Coronary Artery Calcification - Score 79    Coronary Calcium  Score 79.  LAD and circumflex calcification noted.  Normal ascending aorta with mild calcification.   Current every day smoker    pt quit smoking April 2023   Dysrhythmia 01/2023   developed post op a-fib after AAA repair, converted with amiodarone    Emphysema lung (HCC)    Noted on chest CT   Family history of breast cancer 02/21/2022   Family history of prostate cancer 02/21/2022   Hyperlipidemia    Hypertension    Controlled with amlodipine    Pre-diabetes    hx , no meds   Skin cancer 2021   Remove 2022 on left shin   Past Surgical History:  Procedure Laterality Date   ABDOMINAL AORTIC ANEURYSM REPAIR N/A 01/11/2023   Procedure: ANEURYSM ABDOMINAL AORTIC  REPAIR;  Surgeon: Serene Gaile ORN, MD;  Location: [facility] OR;  Service: Vascular;  Laterality: N/A;   BREAST BIOPSY Right    times 3   BREAST RECONSTRUCTION WITH PLACEMENT OF TISSUE EXPANDER AND ALLODERM Right 07/26/2022   Procedure: RIGHT BREAST RECONSTRUCTION WITH PLACEMENT OF TISSUE EXPANDER AND ALLODERM;  Surgeon: [name] Filippo, MD;  Location: [facility] OR;  Service: Plastics;  Laterality: Right;   CESAREAN SECTION  1990, 1991   COSMETIC SURGERY  1981   Rhinoplasty   INCISION AND DRAINAGE OF WOUND Right 03/12/2024   Procedure: IRRIGATION AND DEBRIDEMENT WOUND;  Surgeon: [name], MD;  Location[facility];  Service: General;  Laterality: Right;  DEBRIDEMENT RIGHT CHEST WALL   MASTECTOMY Right    MASTECTOMY W/ SENTINEL NODE BIOPSY Right 07/26/2022   Procedure: RIGHT MASTECTOMY, RIGHT AXILLARY SENTINEL NODE BIOPSY;  Surgeon: [name], MD;  Location: [facility] OR;  Service: General;  Laterality: Right;  GEN & PEC BLOCK   MASTOPEXY Left 12/20/2023   Procedure: MASTOPEXY;  Surgeon: [name] Filippo, MD;  Location: [facility];  Service: Plastics;  Laterality: Left;   PORT-A-CATH REMOVAL Left 12/20/2023   Procedure: REMOVAL PORT-A-CATH;  Surgeon: [name] Filippo, MD;  Location: [facility];  Service: Plastics;  Laterality: Left;   PORTACATH PLACEMENT Left 03/06/2022   Procedure: INSERTION PORT-A-CATH;  Surgeon: [name], MD;  Location: [facility];  Service: General;  Laterality: Left;   RADIOACTIVE SEED GUIDED AXILLARY SENTINEL LYMPH NODE Right 07/26/2022   Procedure: RADIOACTIVE SEED GUIDED AXILLARY SENTINEL LYMPH NODE DISSECTION;  Surgeon: [name], MD;  Location: [facility];  Service: General;  Laterality: Right;   REMOVAL OF TISSUE EXPANDER AND PLACEMENT OF IMPLANT Right 12/20/2023   Procedure: REMOVAL OF TISSUE EXPANDER AND PLACEMENT OF SILICONE IMPLANT;  Surgeon: [name] Filippo, MD;  Location: [facility];  Service:  Plastics;  Laterality: Right;   REMOVAL OF TISSUE EXPANDER AND PLACEMENT OF IMPLANT Right 03/07/2024   Procedure: REMOVAL RIGHT CHEST IMPLANT;  Surgeon: [name] Filippo, MD;  Location: [facility];  Service: Plastics;  Laterality: Right;  REMOVAL OF IMPLANT   RHINOPLASTY  1981   SKIN SPLIT GRAFT Right 04/13/2024   Procedure: APPLICATION, GRAFT, SKIN, SPLIT-THICKNESS;  Surgeon: [name] Filippo, MD;  Location: [facility] OR;  Service: Plastics;  Laterality: Right;  SPLIT THICKNESS SKIN GRAFT FROM RIGHT THIGH TO RIGHT CHEST   TONSILLECTOMY  1978   TRANSTHORACIC ECHOCARDIOGRAM  10/2016   EF 55 to 60%.  Normal systolic and diastolic function.  No ASD/PFO   TUBAL LIGATION  1991   Patient Active Problem List   Diagnosis Date Noted   MSSA (methicillin susceptible Staphylococcus aureus) infection 03/16/2024   Infection of breast implant (HCC) 03/16/2024   Cellulitis of chest wall 03/12/2024   Atrial fibrillation (HCC) 03/07/2024   Hypokalemia 03/07/2024   S/P AAA repair 01/11/2023   Osteopenia 10/21/2022   Aortic atherosclerosis (HCC) 08/10/2022   S/P mastectomy, right 07/26/2022   Carotid atherosclerosis 03/28/2022   Coronary artery disease 03/28/2022   Pulmonary emphysema (HCC) 03/28/2022   Recurrent major depression in remission (HCC) 03/28/2022   Port-A-Cath in place 03/27/2022   Genetic testing 03/12/2022   Family history of breast cancer 02/21/2022   Family history of prostate cancer 02/21/2022   Hypercholesterolemia 02/21/2022   Malignant neoplasm of overlapping sites of right breast in female, estrogen receptor positive (HCC) 02/16/2022   Bilateral carotid bruits 04/26/2020   Essential hypertension 03/19/2020   Hyperlipidemia with target LDL less than 100 03/19/2020   Prediabetes 03/19/2020   Agatston CAC score, <100 03/15/2020    REFERRING DIAG: Rt breast cancer at risk for lymphedema  THERAPY DIAG: Aftercare following surgery for neoplasm  PERTINENT HISTORY: Patient was diagnosed on  02/08/2022 with right grade II invasive lobular carcinoma breast cancer. It measured 2.6 cm and 3.3 cm (2 masses) and is located in the upper outer quadrant. It is ER positive, PR negative, and HER2 positive with a Ki67 of 10%. Patient has COPD and hypertension and a 5cm AAA. She is-s/p right mastectomy  with SLNB 07/26/2022 by Dr. Ebbie and reconstruction by Dr. [name]. Path showed 9.5 cm invasive lobular carcinoma involving dermis of nipple, margins negative, 2/5 positive lymph nodes. Given the significant residual disease, the recommendation is for postmastectomy radiation and switch to adjuvant Kadcyla . 12/20/23 - Reconstruction, implant on R and reduction on left. In April had to have debridement, then implant removed and then had a wound vac placed.   PRECAUTIONS: right UE Lymphedema risk, None  SUBJECTIVE: Pt returns for her 3 month L-Dex screen. I've had an infection in my breast since I was here last. Then they had to remove my implant due to necrotic tissue and I had a wound vac for awhile. It was crazy but I'm doing better now.  PAIN:  Are you having pain? No  SOZO SCREENING: Patient was assessed  today using the SOZO machine to determine the lymphedema index score. This was compared to her baseline score. It was determined that she is within the recommended range when compared to her baseline and no further action is needed at this time. She will continue SOZO screenings. These are done every 3 months for 2 years post operatively followed by every 6 months for 2 years, and then annually.    L-DEX FLOWSHEETS - 06/29/24 0800       L-DEX LYMPHEDEMA SCREENING   Measurement Type Unilateral    L-DEX MEASUREMENT EXTREMITY Upper Extremity    POSITION  Standing    DOMINANT SIDE Right    At Risk Side Right    BASELINE SCORE (UNILATERAL) -0.2    L-DEX SCORE (UNILATERAL) 1    VALUE CHANGE (UNILAT) 1.2             Aden Berwyn Caldron, PTA 06/29/2024, 5:06 PM

## 2024-07-02 DIAGNOSIS — J439 Emphysema, unspecified: Secondary | ICD-10-CM | POA: Diagnosis not present

## 2024-07-02 DIAGNOSIS — I2510 Atherosclerotic heart disease of native coronary artery without angina pectoris: Secondary | ICD-10-CM | POA: Diagnosis not present

## 2024-07-02 DIAGNOSIS — E7800 Pure hypercholesterolemia, unspecified: Secondary | ICD-10-CM | POA: Diagnosis not present

## 2024-07-02 DIAGNOSIS — F339 Major depressive disorder, recurrent, unspecified: Secondary | ICD-10-CM | POA: Diagnosis not present

## 2024-07-02 DIAGNOSIS — I10 Essential (primary) hypertension: Secondary | ICD-10-CM | POA: Diagnosis not present

## 2024-07-02 DIAGNOSIS — C50919 Malignant neoplasm of unspecified site of unspecified female breast: Secondary | ICD-10-CM | POA: Diagnosis not present

## 2024-07-06 ENCOUNTER — Other Ambulatory Visit (HOSPITAL_COMMUNITY): Payer: Self-pay

## 2024-07-06 DIAGNOSIS — L739 Follicular disorder, unspecified: Secondary | ICD-10-CM | POA: Diagnosis not present

## 2024-07-06 DIAGNOSIS — Z6824 Body mass index (BMI) 24.0-24.9, adult: Secondary | ICD-10-CM | POA: Diagnosis not present

## 2024-07-06 MED ORDER — SULFAMETHOXAZOLE-TRIMETHOPRIM 800-160 MG PO TABS
1.0000 | ORAL_TABLET | Freq: Two times a day (BID) | ORAL | 0 refills | Status: DC
  Filled 2024-07-06: qty 20, 10d supply, fill #0

## 2024-07-07 ENCOUNTER — Other Ambulatory Visit: Payer: Self-pay

## 2024-07-18 DIAGNOSIS — I2510 Atherosclerotic heart disease of native coronary artery without angina pectoris: Secondary | ICD-10-CM | POA: Diagnosis not present

## 2024-07-18 DIAGNOSIS — I10 Essential (primary) hypertension: Secondary | ICD-10-CM | POA: Diagnosis not present

## 2024-07-18 DIAGNOSIS — J439 Emphysema, unspecified: Secondary | ICD-10-CM | POA: Diagnosis not present

## 2024-07-20 ENCOUNTER — Other Ambulatory Visit (HOSPITAL_COMMUNITY): Payer: Self-pay

## 2024-07-21 ENCOUNTER — Other Ambulatory Visit (HOSPITAL_COMMUNITY): Payer: Self-pay

## 2024-07-22 ENCOUNTER — Other Ambulatory Visit: Payer: Self-pay

## 2024-07-22 ENCOUNTER — Encounter (INDEPENDENT_AMBULATORY_CARE_PROVIDER_SITE_OTHER): Payer: Self-pay

## 2024-07-22 NOTE — Progress Notes (Signed)
 Specialty Pharmacy Refill Coordination Note  Ashley Robertson is a 68 y.o. female contacted today regarding refills of specialty medication(s) [name]  Maleate (NERLYNX )   Patient requested (Patient-Rptd) Pickup at [facility] date: (Patient-Rptd) 07/27/24   Medication will be filled on 08.22.25.

## 2024-07-24 ENCOUNTER — Other Ambulatory Visit: Payer: Self-pay

## 2024-07-24 ENCOUNTER — Ambulatory Visit (HOSPITAL_COMMUNITY)
Admission: RE | Admit: 2024-07-24 | Discharge: 2024-07-24 | Disposition: A | Discharge status: Home / Self Care | Source: Ambulatory Visit | Attending: Internal Medicine | Admitting: Internal Medicine

## 2024-07-24 DIAGNOSIS — I700 Atherosclerosis of aorta: Secondary | ICD-10-CM | POA: Diagnosis not present

## 2024-07-24 DIAGNOSIS — I358 Other nonrheumatic aortic valve disorders: Secondary | ICD-10-CM | POA: Diagnosis not present

## 2024-07-24 DIAGNOSIS — I350 Nonrheumatic aortic (valve) stenosis: Secondary | ICD-10-CM | POA: Insufficient documentation

## 2024-07-24 LAB — ECHOCARDIOGRAM LIMITED
AR max vel: 2.01 cm2
AV Area VTI: 2.1 cm2
AV Area mean vel: 2.15 cm2
AV Mean grad: 6 mmHg
AV Peak grad: 11.4 mmHg
Ao pk vel: 1.69 m/s
Calc EF: 67.2 %
S' Lateral: 3.3 cm
Single Plane A2C EF: 60.8 %
Single Plane A4C EF: 68.2 %

## 2024-08-02 DIAGNOSIS — I2510 Atherosclerotic heart disease of native coronary artery without angina pectoris: Secondary | ICD-10-CM | POA: Diagnosis not present

## 2024-08-02 DIAGNOSIS — J439 Emphysema, unspecified: Secondary | ICD-10-CM | POA: Diagnosis not present

## 2024-08-02 DIAGNOSIS — E7800 Pure hypercholesterolemia, unspecified: Secondary | ICD-10-CM | POA: Diagnosis not present

## 2024-08-02 DIAGNOSIS — I10 Essential (primary) hypertension: Secondary | ICD-10-CM | POA: Diagnosis not present

## 2024-08-02 DIAGNOSIS — C50919 Malignant neoplasm of unspecified site of unspecified female breast: Secondary | ICD-10-CM | POA: Diagnosis not present

## 2024-08-02 DIAGNOSIS — F339 Major depressive disorder, recurrent, unspecified: Secondary | ICD-10-CM | POA: Diagnosis not present

## 2024-08-06 ENCOUNTER — Ambulatory Visit: Payer: Self-pay | Admitting: Cardiovascular Disease

## 2024-08-06 ENCOUNTER — Other Ambulatory Visit: Payer: Self-pay

## 2024-08-17 DIAGNOSIS — I10 Essential (primary) hypertension: Secondary | ICD-10-CM | POA: Diagnosis not present

## 2024-08-17 DIAGNOSIS — I2510 Atherosclerotic heart disease of native coronary artery without angina pectoris: Secondary | ICD-10-CM | POA: Diagnosis not present

## 2024-08-17 DIAGNOSIS — J439 Emphysema, unspecified: Secondary | ICD-10-CM | POA: Diagnosis not present

## 2024-08-19 ENCOUNTER — Ambulatory Visit: Admitting: Hematology

## 2024-08-19 ENCOUNTER — Other Ambulatory Visit

## 2024-08-21 ENCOUNTER — Other Ambulatory Visit: Payer: Self-pay

## 2024-08-21 ENCOUNTER — Encounter (INDEPENDENT_AMBULATORY_CARE_PROVIDER_SITE_OTHER): Payer: Self-pay

## 2024-08-21 ENCOUNTER — Other Ambulatory Visit: Payer: Self-pay | Admitting: Pharmacy Technician

## 2024-08-21 NOTE — Progress Notes (Signed)
 Specialty Pharmacy Refill Coordination Note  Ashley Robertson is a 68 y.o. female contacted today regarding refills of specialty medication(s) [name]  Maleate (NERLYNX )   Patient requested [name] at [facility] at [place] date: 08/26/24   Medication will be filled on 08/25/24. Answered questionnaire.

## 2024-08-23 NOTE — Assessment & Plan Note (Signed)
 invasive lobular carcinoma, Stage IIIA, c(T3, N1), yp(T3, N1a), ER+/PR-/HER2+, Grade 2  -Diagnosed in 01/2022 -s/p neoadjuvant chemo TCHP X6, followed by right mastectomy by Dr. Ebbie and reconstruction by Dr. [name]. Unfortunately she did not have much response to neoadjvuant chemo  -She completed adjuvant Kadcyla  on 06/20/2023 -she received postmastectomy radiation under Dr. Dewey, 10/10 - 10/29/22. -she started adjuvant anastrozole  in January 2024, she is tolerating very well, will continue for 7 years if she tolerates  -Echocardiogram in January 2024 showed slightly decreased EF 50 to 55%.  She was seen by cardiologist after echo, her cardiac medication was adjusted, repeated echo on 03/18/23 showed normal EF 55%.  -she has been on adjuvant neratinib  since August 2024, she held it for about 2 months in 2025 due to her surgeries, plan to compete in October 2025. -Continue breast cancer surveillance, Signatera was negative in May 2025, will continue every 6 months.

## 2024-08-24 ENCOUNTER — Inpatient Hospital Stay (HOSPITAL_BASED_OUTPATIENT_CLINIC_OR_DEPARTMENT_OTHER): Admitting: Hematology

## 2024-08-24 ENCOUNTER — Inpatient Hospital Stay: Attending: Hematology

## 2024-08-24 VITALS — BP 136/58 | HR 57 | Temp 97.5°F | Resp 17 | Ht 69.0 in | Wt 171.3 lb

## 2024-08-24 DIAGNOSIS — Z9011 Acquired absence of right breast and nipple: Secondary | ICD-10-CM | POA: Insufficient documentation

## 2024-08-24 DIAGNOSIS — D649 Anemia, unspecified: Secondary | ICD-10-CM

## 2024-08-24 DIAGNOSIS — Z9221 Personal history of antineoplastic chemotherapy: Secondary | ICD-10-CM | POA: Diagnosis not present

## 2024-08-24 DIAGNOSIS — Z1731 Human epidermal growth factor receptor 2 positive status: Secondary | ICD-10-CM | POA: Insufficient documentation

## 2024-08-24 DIAGNOSIS — Z170 Estrogen receptor positive status [ER+]: Secondary | ICD-10-CM | POA: Insufficient documentation

## 2024-08-24 DIAGNOSIS — C773 Secondary and unspecified malignant neoplasm of axilla and upper limb lymph nodes: Secondary | ICD-10-CM | POA: Insufficient documentation

## 2024-08-24 DIAGNOSIS — C50911 Malignant neoplasm of unspecified site of right female breast: Secondary | ICD-10-CM

## 2024-08-24 DIAGNOSIS — Z87891 Personal history of nicotine dependence: Secondary | ICD-10-CM | POA: Insufficient documentation

## 2024-08-24 DIAGNOSIS — Z1722 Progesterone receptor negative status: Secondary | ICD-10-CM | POA: Insufficient documentation

## 2024-08-24 DIAGNOSIS — Z79811 Long term (current) use of aromatase inhibitors: Secondary | ICD-10-CM | POA: Diagnosis not present

## 2024-08-24 DIAGNOSIS — C50811 Malignant neoplasm of overlapping sites of right female breast: Secondary | ICD-10-CM

## 2024-08-24 LAB — CBC WITH DIFFERENTIAL (CANCER CENTER ONLY)
Abs Immature Granulocytes: 0.03 K/uL (ref 0.00–0.07)
Basophils Absolute: 0 K/uL (ref 0.0–0.1)
Basophils Relative: 1 %
Eosinophils Absolute: 0.2 K/uL (ref 0.0–0.5)
Eosinophils Relative: 3 %
HCT: 38.5 % (ref 36.0–46.0)
Hemoglobin: 13.3 g/dL (ref 12.0–15.0)
Immature Granulocytes: 1 %
Lymphocytes Relative: 11 %
Lymphs Abs: 0.6 K/uL — ABNORMAL LOW (ref 0.7–4.0)
MCH: 33.8 pg (ref 26.0–34.0)
MCHC: 34.5 g/dL (ref 30.0–36.0)
MCV: 98 fL (ref 80.0–100.0)
Monocytes Absolute: 0.5 K/uL (ref 0.1–1.0)
Monocytes Relative: 10 %
Neutro Abs: 3.8 K/uL (ref 1.7–7.7)
Neutrophils Relative %: 74 %
Platelet Count: 135 K/uL — ABNORMAL LOW (ref 150–400)
RBC: 3.93 MIL/uL (ref 3.87–5.11)
RDW: 14.4 % (ref 11.5–15.5)
WBC Count: 5.1 K/uL (ref 4.0–10.5)
nRBC: 0 % (ref 0.0–0.2)

## 2024-08-24 LAB — CMP (CANCER CENTER ONLY)
ALT: 16 U/L (ref 0–44)
AST: 23 U/L (ref 15–41)
Albumin: 3.9 g/dL (ref 3.5–5.0)
Alkaline Phosphatase: 87 U/L (ref 38–126)
Anion gap: 5 (ref 5–15)
BUN: 20 mg/dL (ref 8–23)
CO2: 28 mmol/L (ref 22–32)
Calcium: 9 mg/dL (ref 8.9–10.3)
Chloride: 105 mmol/L (ref 98–111)
Creatinine: 0.77 mg/dL (ref 0.44–1.00)
GFR, Estimated: >60 mL/min (ref >60)
Glucose, Bld: 100 mg/dL — ABNORMAL HIGH (ref 70–99)
Potassium: 4.3 mmol/L (ref 3.5–5.1)
Sodium: 138 mmol/L (ref 135–145)
Total Bilirubin: 0.5 mg/dL (ref 0.0–1.2)
Total Protein: 7.1 g/dL (ref 6.5–8.1)

## 2024-08-24 LAB — GENETIC SCREENING ORDER

## 2024-08-24 NOTE — Progress Notes (Signed)
 [facility]   Telephone:([phone] Fax:([phone]) [phone]   Clinic Follow up Note   Patient Care Team: [name], MD as PCP - General (Family Medicine) [name] ORN, MD as PCP - Cardiology (Cardiology) [name], MD as PCP - Electrophysiology (Cardiology) [name], RN as Oncology Nurse Navigator [name], MD as Consulting Physician (General Surgery) [name], MD as Consulting Physician (Hematology) Dewey Rush, MD as Consulting Physician (Radiation Oncology) Burton, Lacie K, NP as Nurse Practitioner (Nurse Practitioner)  Date of Service:  08/24/2024  CHIEF COMPLAINT: f/u of breast cancer   CURRENT THERAPY:  Adjuvant anastrozole  and Nerlynx    Oncology History   Malignant neoplasm of overlapping sites of right breast in female, estrogen receptor positive (HCC) invasive lobular carcinoma, Stage IIIA, c(T3, N1), yp(T3, N1a), ER+/PR-/HER2+, Grade 2  -Diagnosed in 01/2022 -s/p neoadjuvant chemo TCHP X6, followed by right mastectomy by Dr. Ebbie and reconstruction by Dr. [name]. Unfortunately she did not have much response to neoadjvuant chemo  -She completed adjuvant Kadcyla  on 06/20/2023 -she received postmastectomy radiation under Dr. Dewey, 10/10 - 10/29/22. -she started adjuvant anastrozole  in January 2024, she is tolerating very well, will continue for 7 years if she tolerates  -Echocardiogram in January 2024 showed slightly decreased EF 50 to 55%.  She was seen by cardiologist after echo, her cardiac medication was adjusted, repeated echo on 03/18/23 showed normal EF 55%.  -she has been on adjuvant neratinib  since August 2024, she held it for about 2 months in 2025 due to her surgeries, plan to compete in October 2025. -Continue breast cancer surveillance, Signatera was negative in May 2025, will continue every 6 months.  Assessment & Plan Right breast cancer  Right breast cancer status post-mastectomy with ongoing  anastrozole  and Nerlynx  therapy. No new symptoms or concerns reported. Routine breast checks show no abnormalities. The skin graft is healed, and no further intervention is planned for the right side. She uses a cotton-filled pocket for comfort due to concerns about prosthetic weight and potential ulceration. - Continue anastrozole  for 7 to 10 years - Repeat Signatera test today - Monitor MyChart for Signatera results in 2 weeks - Provide cotton-filled pocket for comfort - Schedule follow-up in 4 months with nurse practitioner  Atrial fibrillation on anticoagulation Atrial fibrillation managed with Eliquis  and amiodarone . No new symptoms or concerns reported. She is hopeful to discontinue these medications after consulting with her cardiologist. - Continue Eliquis  - Consult with cardiologist regarding potential discontinuation of Eliquis  and amiodarone   Plan - Patient is clinically doing well, no concern for recurrence. - She will continue noting next on 2 the end of October 2025. - She will continue anastrozole  - Lab and follow-up in 6 months   SUMMARY OF ONCOLOGIC HISTORY: Oncology History Overview Note   Cancer Staging  Malignant neoplasm of overlapping sites of right breast in female, estrogen receptor positive (HCC) Staging form: Breast, AJCC 8th Edition - Clinical stage from 02/09/2022: Stage IIA (cT2, cN1, cM0, G2, ER+, PR-, HER2+) - Signed by [name], MD on 02/20/2022    Malignant neoplasm of overlapping sites of right breast in female, estrogen receptor positive (HCC)  02/08/2022 Mammogram   CLINICAL DATA:  Acute onset right breast lump.  EXAM: DIGITAL DIAGNOSTIC UNILATERAL RIGHT MAMMOGRAM WITH TOMOSYNTHESIS AND CAD; ULTRASOUND RIGHT BREAST LIMITED  IMPRESSION: 1. Highly suspicious findings mammographically and sonographically. I suspect there is significant malignancy throughout most of the right breast. Discrete masses are seen at 11 o'clock and 6  o'clock.  A single  borderline lymph node is identified. This lymph node appears prominent compared to the remainder of the lymph nodes. The skin thickening suggests the possibility of inflammatory breast cancer.   02/09/2022 Cancer Staging   Staging form: Breast, AJCC 8th Edition - Clinical stage from 02/09/2022: Stage IIIA (cT3, cN1, cM0, G2, ER+, PR-, HER2+) - Signed by [name], MD on 03/05/2022 Stage prefix: Initial diagnosis Histologic grading system: 3 grade system   02/09/2022 Initial Biopsy   Diagnosis 1. Breast, right, needle core biopsy, right breast 11 o'clock mass ribbon clip - INVASIVE MAMMARY CARCINOMA - SEE COMMENT 2. Breast, right, needle core biopsy, right breast 6'oclock mass, coil clip - INVASIVE MAMMARY CARCINOMA - SEE COMMENT 3. Lymph node, needle/core biopsy, right axillary lymph node, tribell clip - METASTATIC CARCINOMA INVOLVING NODAL TISSUE Microscopic Comment 1. and 2. The biopsy material shows an infiltrative proliferation of cells arranged linearly and in small clusters. Based on the biopsy, the carcinoma appears Nottingham grade 2 of 3 and measures 1.5 cm in greatest linear extent.  Addendum parts 1 and 2: Immunohistochemistry for E-cadherin is negative consistent with lobular carcinoma.  2. PROGNOSTIC INDICATORS Results: The tumor cells are EQUIVOCAL for Her2 (2+). Her2 by FISH will be performed and the results reported separately. Estrogen Receptor: 100%, POSITIVE, STRONG STAINING INTENSITY Progesterone Receptor: <1%, NEGATIVE Proliferation Marker Ki67: 10%  2. FLUORESCENCE IN-SITU HYBRIDIZATION Results: GROUP 1: HER2 **POSITIVE**   3. PROGNOSTIC INDICATORS Results: By immunohistochemistry, the tumor cells are POSITIVE for Her2 (3+). Estrogen Receptor: 100%, POSITIVE, STRONG STAINING INTENSITY Progesterone Receptor: <1%, NEGATIVE   02/16/2022 Initial Diagnosis   Malignant neoplasm of overlapping sites of right breast in female, estrogen receptor positive (HCC)    03/01/2022 Imaging   EXAM: BILATERAL BREAST MRI WITH AND WITHOUT CONTRAST  IMPRESSION: 1. 8.5 x 8.3 x 5.9 cm area of confluent mass-like enhancement in the right breast involving all 4 quadrants and containing 2 biopsy marker clip artifacts, compatible with 4 quadrant biopsy-proven malignancy. 2. Biopsy-proven metastatic lymph node in the right axilla. 3. Possible metastatic intramammary lymph node in the posterior outer right breast just above the level of the nipple. 4. No evidence of malignancy on the left.   03/01/2022 Genetic Testing   Negative hereditary cancer genetic testing: no pathogenic variants detected in [employer] CustomNext-Cancer +RNAinsight Panel.  Variant of uncertain significance reported in BRIP1 at p.F934V (c.2800T>G). Report date is March 01, 2022.    The CustomNext-Cancer+RNAinsight panel offered by [employer] includes sequencing and rearrangement analysis for the following 47 genes:  APC, ATM, AXIN2, BARD1, BMPR1A, BRCA1, BRCA2, BRIP1, CDH1, CDK4, CDKN2A, CHEK2, DICER1, EPCAM, GREM1, HOXB13, MEN1, MLH1, MSH2, MSH3, MSH6, MUTYH, NBN, NF1, NF2, NTHL1, PALB2, PMS2, POLD1, POLE, PTEN, RAD51C, RAD51D, RECQL, RET, SDHA, SDHAF2, SDHB, SDHC, SDHD, SMAD4, SMARCA4, STK11, TP53, TSC1, TSC2, and VHL.  RNA data is routinely analyzed for use in variant interpretation for all genes.  UPDATE: BRIP1 p.F934V VUS has been amended to Likely Benign. Amended report date is 11/06/2022.    03/05/2022 Imaging   EXAM: CT CHEST, ABDOMEN, AND PELVIS WITH CONTRAST  IMPRESSION: 1. Asymmetric, masslike density of the glandular tissue of the right breast with overlying skin thickening, in keeping with known primary breast malignancy. 2. Enlarged, ill-defined right axillary lymph node containing a biopsy marking clip, consistent with known nodal metastatic disease. 3. No other evidence of lymphadenopathy or metastatic disease in the chest, abdomen, or pelvis. 4. Emphysema. Background of fine  centrilobular nodularity, most concentrated  in the lung apices, consistent with smoking-related respiratory bronchiolitis. 5. Aneurysm of the infrarenal abdominal aorta measuring up to 5.4 x 5.3 cm with a large burden of eccentric mural thrombus. Recommend follow-up CT/MR every 6 months and vascular consultation. This recommendation follows ACR consensus guidelines: White Paper of the ACR Incidental Findings Committee II on Vascular Findings. J Am Coll Radiol 2013; 10:789-794. 6. Coronary artery disease.   03/05/2022 Imaging   EXAM: NUCLEAR MEDICINE WHOLE BODY BONE SCAN  IMPRESSION: No evidence of bony metastatic disease.   03/07/2022 - 08/01/2022 Chemotherapy   Patient is on Treatment Plan : BREAST  Docetaxel  + Carboplatin  + Trastuzumab  + Pertuzumab   (TCHP) q21d      07/26/2022 Surgery   Right mastectomy with axillary node seed guided excision converted to axillary node dissection with reconstruction by Drs. Ebbie and Thimmappa   07/26/2022 Pathology Results   FINAL MICROSCOPIC DIAGNOSIS:   A. LYMPH NODE, RIGHT AXILLARY TARGETED, EXCISION:  - Metastatic carcinoma involving one lymph node, 2 cm (1/1).  - Focal extranodal extension.  - Biopsy site and biopsy clip.   B. BREAST, RIGHT, MASTECTOMY:  - Invasive lobular carcinoma, 9.5 cm (ypT3).  - Carcinoma involves dermis of nipple.  - All surgical margins negative for carcinoma.  - One lymph node negative for metastatic carcinoma (0/1).  - Biopsy sites and biopsy clips.  - See oncology table.   C. LYMPH NODE, RIGHT AXILLARY, SENTINEL, EXCISION:  - One lymph node negative for metastatic carcinoma (0/1).   D. LYMPH NODE, RIGHT AXILLARY, SENTINEL, EXCISION:  - Metastatic carcinoma in one lymph node, 0.5 cm. (1/1).   E. LYMPH NODE, RIGHT AXILLARY, SENTINEL, EXCISION:  - One lymph node negative for metastatic carcinoma (0/1).    07/26/2022 Cancer Staging   Cancer Staging  Malignant neoplasm of overlapping sites of right breast in  female, estrogen receptor positive (HCC) Staging form: Breast, AJCC 8th Edition - Clinical stage from 02/09/2022: Stage IIIA (cT3, cN1, cM0, G2, ER+, PR-, HER2+) - Signed by [name], MD on 03/05/2022 Stage prefix: Initial diagnosis Histologic grading system: 3 grade system - Pathologic stage from 07/26/2022: Stage IIIA (pT3, pN1a, cM0, G2, ER+, PR-, HER2+) - Signed by Ann Mayme POUR, NP on 08/01/2022 Histologic grading system: 3 grade system    07/26/2022 Cancer Staging   Staging form: Breast, AJCC 8th Edition - Pathologic stage from 07/26/2022: Stage IIIA (pT3, pN1a, cM0, G2, ER+, PR-, HER2+) - Signed by Burton, Lacie K, NP on 08/01/2022 Histologic grading system: 3 grade system   08/24/2022 -  Chemotherapy   Patient is on Treatment Plan : BREAST ADO-Trastuzumab Emtansine  (Kadcyla ) q21d     07/24/2023 Imaging   Echocardiogram  Normal LVF at 55-60 % Normal left diastolic function. Normal right ventricle  Normal visualized valves with preserved function   03/24/2024 Survivorship   SCP delivered by Lacie Burton, NP      Discussed the use of AI scribe software for clinical note transcription with the patient, who gave verbal consent to proceed.  History of Present Illness Ashley Robertson is a 68 year old female with breast cancer who presents for follow-up.  She experiences diarrhea as a side effect of Nourianz, which is manageable without Imodium. She has gained weight due to increased food intake during treatment and is finding it difficult to reduce her intake.  She underwent surgery for breast cancer and has a healed skin graft on the right side. She uses a cotton-filled pocket instead of a prosthetic due to  concerns about ulcers on the tender spot and plans to follow up for a prosthetic fitting soon.  She is currently taking anastrozole . Routine breast checks reveal no changes or pain. Her last mammogram was in January, with the next one due in January next year. She has no new  symptoms or concerns at this time.     All other systems were reviewed with the patient and are negative.  MEDICAL HISTORY:  Past Medical History:  Diagnosis Date   AAA (abdominal aortic aneurysm) 01/11/2023   Allergy    seasonal - on claritin   Anxiety and depression    Aortic atherosclerosis 03/2020   CT Chest: 2 V (LAD & LCx) Coronary Atherosclerosis, Aortic Atherosclerosis (no aneurysm).  Mild centrilobular emphysema with mild diffuse bronchial thickening; several scattered small solitary pulmonary nodules (largest 5.6 mm in anterior left upper lobe)   Breast cancer (HCC) 12/2021   Right breast ILC   Carotid artery plaque, bilateral 10/2015   Mild to moderate plaque L>R without significant stenosis   COPD (chronic obstructive pulmonary disease) (HCC)    Coronary Artery Calcification - Score 79    Coronary Calcium  Score 79.  LAD and circumflex calcification noted.  Normal ascending aorta with mild calcification.   Current every day smoker    pt quit smoking April 2023   Dysrhythmia 01/2023   developed post op a-fib after AAA repair, converted with amiodarone    Emphysema lung (HCC)    Noted on chest CT   Family history of breast cancer 02/21/2022   Family history of prostate cancer 02/21/2022   Hyperlipidemia    Hypertension    Controlled with amlodipine    Pre-diabetes    hx , no meds   Skin cancer 2021   Remove 2022 on left shin    SURGICAL HISTORY: Past Surgical History:  Procedure Laterality Date   ABDOMINAL AORTIC ANEURYSM REPAIR N/A 01/11/2023   Procedure: ANEURYSM ABDOMINAL AORTIC REPAIR;  Surgeon: Serene Gaile ORN, MD;  Location: [facility] OR;  Service: Vascular;  Laterality: N/A;   BREAST BIOPSY Right    times 3   BREAST RECONSTRUCTION WITH PLACEMENT OF TISSUE EXPANDER AND ALLODERM Right 07/26/2022   Procedure: RIGHT BREAST RECONSTRUCTION WITH PLACEMENT OF TISSUE EXPANDER AND ALLODERM;  Surgeon: [name] Filippo, MD;  Location: [facility] OR;  Service: Plastics;  Laterality:  Right;   CESAREAN SECTION  1990, 1991   COSMETIC SURGERY  1981   Rhinoplasty   INCISION AND DRAINAGE OF WOUND Right 03/12/2024   Procedure: IRRIGATION AND DEBRIDEMENT WOUND;  Surgeon: [name], MD;  Location: [facility];  Service: General;  Laterality: Right;  DEBRIDEMENT RIGHT CHEST WALL   MASTECTOMY Right    MASTECTOMY W/ SENTINEL NODE BIOPSY Right 07/26/2022   Procedure: RIGHT MASTECTOMY, RIGHT AXILLARY SENTINEL NODE BIOPSY;  Surgeon: [name], MD;  Location: [facility] OR;  Service: General;  Laterality: Right;  GEN & PEC BLOCK   MASTOPEXY Left 12/20/2023   Procedure: MASTOPEXY;  Surgeon: [name] Filippo, MD;  Location: [facility];  Service: Plastics;  Laterality: Left;   PORT-A-CATH REMOVAL Left 12/20/2023   Procedure: REMOVAL PORT-A-CATH;  Surgeon: [name] Filippo, MD;  Location: [facility];  Service: Plastics;  Laterality: Left;   PORTACATH PLACEMENT Left 03/06/2022   Procedure: INSERTION PORT-A-CATH;  Surgeon: [name], MD;  Location: [facility];  Service: General;  Laterality: Left;   RADIOACTIVE SEED GUIDED AXILLARY SENTINEL LYMPH NODE Right 07/26/2022   Procedure: RADIOACTIVE SEED GUIDED AXILLARY SENTINEL LYMPH NODE  DISSECTION;  Surgeon: [name], MD;  Location: [facility] OR;  Service: General;  Laterality: Right;   REMOVAL OF TISSUE EXPANDER AND PLACEMENT OF IMPLANT Right 12/20/2023   Procedure: REMOVAL OF TISSUE EXPANDER AND PLACEMENT OF SILICONE IMPLANT;  Surgeon: [name] Filippo, MD;  Location: [facility];  Service: Plastics;  Laterality: Right;   REMOVAL OF TISSUE EXPANDER AND PLACEMENT OF IMPLANT Right 03/07/2024   Procedure: REMOVAL RIGHT CHEST IMPLANT;  Surgeon: [name] Filippo, MD;  Location: [facility];  Service: Plastics;  Laterality: Right;  REMOVAL OF IMPLANT   RHINOPLASTY  1981   SKIN SPLIT GRAFT Right 04/13/2024   Procedure: APPLICATION, GRAFT, SKIN, SPLIT-THICKNESS;  Surgeon: [name] Filippo, MD;  Location: [facility] OR;  Service: Plastics;  Laterality: Right;  SPLIT THICKNESS SKIN GRAFT FROM RIGHT THIGH TO RIGHT CHEST   TONSILLECTOMY  1978   TRANSTHORACIC ECHOCARDIOGRAM  10/2016   EF 55 to 60%.  Normal systolic and diastolic function.  No ASD/PFO   TUBAL LIGATION  1991    I have reviewed the social history and family history with the patient and they are unchanged from previous note.  ALLERGIES:  is allergic to zestril  [lisinopril ] and lipitor [atorvastatin].  MEDICATIONS:  Current Outpatient Medications  Medication Sig Dispense Refill   acetaminophen  (TYLENOL ) 325 MG tablet Take 2 tablets (650 mg total) by mouth every 6 (six) hours as needed for mild pain (pain score 1-3), fever or headache.     albuterol  (PROVENTIL  HFA) 108 (90 Base) MCG/ACT inhaler Inhale 1 to 2 puffs into the lungs every 6 hours as needed 8.5 g 2   alendronate  (FOSAMAX ) 70 MG tablet Take 1 tablet (70 mg total) by mouth once a week 30 minutes before the first food, beverage or medicine of the day with plain water 12 tablet 4   amiodarone  (PACERONE ) 200 MG tablet Take 1 tablet (200 mg total) by mouth daily. 90 tablet 3   amLODipine  (NORVASC ) 10 MG tablet Take 1 tablet (10 mg total) by mouth at bedtime. 90 tablet 3   anastrozole  (ARIMIDEX ) 1 MG tablet Take 1 tablet (1 mg) by mouth daily. 90 tablet 3   apixaban  (ELIQUIS ) 5 MG TABS tablet Take 1 tablet (5 mg total) by mouth 2 (two) times daily. 180 tablet 3   Calcium  Carb-Cholecalciferol  (CALCIUM  + VITAMIN D3 PO) Take 1 tablet by mouth at bedtime.     carvedilol  (COREG ) 6.25 MG tablet Take 1 tablet (6.25 mg total) by mouth 2 (two) times daily. 180 tablet 3   Cholecalciferol  (VITAMIN D -3 PO) Take 1 tablet by mouth at bedtime.     ezetimibe  (ZETIA ) 10 MG tablet Take 1 tablet (10 mg total) by mouth daily. (Patient taking differently: Take 10 mg by mouth at bedtime.) 90 tablet 2   ezetimibe  (ZETIA ) 10 MG tablet Take 1 tablet (10 mg total) by mouth daily. 90 tablet 4    FLUoxetine  (PROZAC ) 20 MG capsule Take 1 capsule (20 mg total) by mouth daily. 90 capsule 4   Fluticasone -Umeclidin-Vilant (TRELEGY ELLIPTA ) 200-62.5-25 MCG/ACT AEPB Inhale 1 puff into the lungs daily. 60 each 2   [name]  Maleate (NERLYNX ) 40 MG tablet Take 6 tablets (240 mg total) by mouth daily. Take with food. (Patient not taking: Reported on 05/06/2024) 180 tablet 4   Prenatal Vit-Fe Fumarate-FA (PRENATAL PO) Take 1 tablet by mouth at bedtime.     rosuvastatin  (CRESTOR ) 5 MG tablet Take 1 tablet by mouth daily at bedtime ,except on Monday, Wednesday and Fridays take 2  tablets at bedtime as directed 180 tablet 3   sulfamethoxazole -trimethoprim  (BACTRIM  DS) 800-160 MG tablet Take 1 tablet by mouth 2 (two) times daily. 20 tablet 0   No current facility-administered medications for this visit.    PHYSICAL EXAMINATION: ECOG PERFORMANCE STATUS: 0 - Asymptomatic  Vitals:   08/24/24 0854  BP: (!) 136/58  Pulse: (!) 57  Resp: 17  Temp: (!) 97.5 F (36.4 C)  SpO2: 97%   Wt Readings from Last 3 Encounters:  08/24/24 171 lb 4.8 oz (77.7 kg)  06/29/24 163 lb 2 oz (74 kg)  06/10/24 163 lb 6.4 oz (74.1 kg)     GENERAL:alert, no distress and comfortable SKIN: skin color, texture, turgor are normal, no rashes or significant lesions EYES: normal, Conjunctiva are pink and non-injected, sclera clear NECK: supple, thyroid  normal size, non-tender, without nodularity LYMPH:  no palpable lymphadenopathy in the cervical, axillary  LUNGS: clear to auscultation and percussion with normal breathing effort HEART: regular rate & rhythm and no murmurs and no lower extremity edema ABDOMEN:abdomen soft, non-tender and normal bowel sounds Musculoskeletal:no cyanosis of digits and no clubbing  NEURO: alert & oriented x 3 with fluent speech, no focal motor/sensory deficits Breasts: Breast inspection showed status post right mastectomy and skin graft, incisions all healed well, no palpable mass or nodule on  the chest wall.  Left breast exam was unremarkable.  No palpable adenopathy.   Physical Exam    LABORATORY DATA:  I have reviewed the data as listed    Latest Ref Rng & Units 08/24/2024    8:10 AM 06/10/2024    8:20 AM 05/06/2024    8:13 AM  CBC  WBC 4.0 - 10.5 K/uL 5.1  4.5  3.8   Hemoglobin 12.0 - 15.0 g/dL 86.6  86.4  86.6   Hematocrit 36.0 - 46.0 % 38.5  40.0  40.4   Platelets 150 - 400 K/uL 135  129  154         Latest Ref Rng & Units 08/24/2024    8:10 AM 06/10/2024    8:20 AM 05/06/2024    8:13 AM  CMP  Glucose 70 - 99 mg/dL 899  888  889   BUN 8 - 23 mg/dL 20  25  21    Creatinine 0.44 - 1.00 mg/dL 9.22  9.24  9.25   Sodium 135 - 145 mmol/L 138  139  140   Potassium 3.5 - 5.1 mmol/L 4.3  4.7  4.3   Chloride 98 - 111 mmol/L 105  105  104   CO2 22 - 32 mmol/L 28  29  30    Calcium  8.9 - 10.3 mg/dL 9.0  9.3  9.1   Total Protein 6.5 - 8.1 g/dL 7.1  7.0  7.3   Total Bilirubin 0.0 - 1.2 mg/dL 0.5  0.4  0.4   Alkaline Phos 38 - 126 U/L 87  108  114   AST 15 - 41 U/L 23  26  26    ALT 0 - 44 U/L 16  18  21        RADIOGRAPHIC STUDIES: I have personally reviewed the radiological images as listed and agreed with the findings in the report. No results found.    No orders of the defined types were placed in this encounter.  All questions were answered. The patient knows to call the clinic with any problems, questions or concerns. No barriers to learning was detected. The total time spent in the appointment was 25 minutes,  including review of chart and various tests results, discussions about plan of care and coordination of care plan     Onita Mattock, MD 08/24/2024

## 2024-08-25 ENCOUNTER — Other Ambulatory Visit: Payer: Self-pay

## 2024-08-27 ENCOUNTER — Other Ambulatory Visit: Payer: Self-pay

## 2024-08-27 ENCOUNTER — Other Ambulatory Visit (HOSPITAL_COMMUNITY): Payer: Self-pay

## 2024-09-01 DIAGNOSIS — E7800 Pure hypercholesterolemia, unspecified: Secondary | ICD-10-CM | POA: Diagnosis not present

## 2024-09-01 DIAGNOSIS — I2510 Atherosclerotic heart disease of native coronary artery without angina pectoris: Secondary | ICD-10-CM | POA: Diagnosis not present

## 2024-09-01 DIAGNOSIS — I10 Essential (primary) hypertension: Secondary | ICD-10-CM | POA: Diagnosis not present

## 2024-09-01 DIAGNOSIS — F339 Major depressive disorder, recurrent, unspecified: Secondary | ICD-10-CM | POA: Diagnosis not present

## 2024-09-01 DIAGNOSIS — C50919 Malignant neoplasm of unspecified site of unspecified female breast: Secondary | ICD-10-CM | POA: Diagnosis not present

## 2024-09-01 DIAGNOSIS — J439 Emphysema, unspecified: Secondary | ICD-10-CM | POA: Diagnosis not present

## 2024-09-07 ENCOUNTER — Other Ambulatory Visit: Payer: Self-pay

## 2024-09-07 ENCOUNTER — Other Ambulatory Visit (HOSPITAL_COMMUNITY): Payer: Self-pay

## 2024-09-07 LAB — SIGNATERA
SIGNATERA MTM READOUT: 0 MTM/ml
SIGNATERA TEST RESULT: NEGATIVE

## 2024-09-09 ENCOUNTER — Other Ambulatory Visit (HOSPITAL_COMMUNITY): Payer: Self-pay

## 2024-09-09 MED ORDER — ALBUTEROL SULFATE HFA 108 (90 BASE) MCG/ACT IN AERS
2.0000 | INHALATION_SPRAY | Freq: Four times a day (QID) | RESPIRATORY_TRACT | 1 refills | Status: AC | PRN
  Filled 2024-09-09: qty 6.7, 17d supply, fill #0

## 2024-09-14 ENCOUNTER — Telehealth (HOSPITAL_COMMUNITY): Payer: Self-pay | Admitting: Cardiology

## 2024-09-16 ENCOUNTER — Telehealth: Payer: Self-pay | Admitting: Hematology

## 2024-09-16 DIAGNOSIS — I2510 Atherosclerotic heart disease of native coronary artery without angina pectoris: Secondary | ICD-10-CM | POA: Diagnosis not present

## 2024-09-16 DIAGNOSIS — I10 Essential (primary) hypertension: Secondary | ICD-10-CM | POA: Diagnosis not present

## 2024-09-16 DIAGNOSIS — J439 Emphysema, unspecified: Secondary | ICD-10-CM | POA: Diagnosis not present

## 2024-09-16 NOTE — Telephone Encounter (Signed)
 Ashley Robertson called in to reschedule her 1/19 appts to 1/20. She has been re-scheduled per her preference.

## 2024-09-17 ENCOUNTER — Other Ambulatory Visit: Payer: Self-pay

## 2024-09-17 ENCOUNTER — Encounter (INDEPENDENT_AMBULATORY_CARE_PROVIDER_SITE_OTHER): Payer: Self-pay

## 2024-09-17 ENCOUNTER — Other Ambulatory Visit: Payer: Self-pay | Admitting: Hematology

## 2024-09-17 DIAGNOSIS — C50911 Malignant neoplasm of unspecified site of right female breast: Secondary | ICD-10-CM

## 2024-09-18 ENCOUNTER — Other Ambulatory Visit: Payer: Self-pay

## 2024-09-18 NOTE — Progress Notes (Signed)
 Per OV on 9/22, patient should finish Nerlynx  in October 2025. Confirmed with Asberry Lied that patient will be done after she finishes her current supply. Disenrolled.

## 2024-09-22 ENCOUNTER — Other Ambulatory Visit: Payer: Self-pay | Admitting: Hematology

## 2024-09-22 ENCOUNTER — Other Ambulatory Visit: Payer: Self-pay | Admitting: Cardiology

## 2024-09-22 DIAGNOSIS — C50911 Malignant neoplasm of unspecified site of right female breast: Secondary | ICD-10-CM

## 2024-09-22 NOTE — Progress Notes (Signed)
 Cardio-Oncology Clinic Consult Note   Referring Physician: Dr. Alvan Primary Care: Primary Cardiologist:  HPI:  Ashley Robertson is a 68 y.o. female with past medical history of HER2+, ER+ R breast cancer, who has been referred by Dr. Alvan to establish in the cardio-oncology clinic for monitoring of cardio-toxicity while undergoing chemotherapy.       Oncology History Overview Note   Cancer Staging  Malignant neoplasm of overlapping sites of right breast in female, estrogen receptor positive (HCC) Staging form: Breast, AJCC 8th Edition - Clinical stage from 02/09/2022: Stage IIA (cT2, cN1, cM0, G2, ER+, PR-, HER2+) - Signed by [name], MD on 02/20/2022    Malignant neoplasm of overlapping sites of right breast in female, estrogen receptor positive (HCC)  02/08/2022 Mammogram   CLINICAL DATA:  Acute onset right breast lump.  EXAM: DIGITAL DIAGNOSTIC UNILATERAL RIGHT MAMMOGRAM WITH TOMOSYNTHESIS AND CAD; ULTRASOUND RIGHT BREAST LIMITED  IMPRESSION: 1. Highly suspicious findings mammographically and sonographically. I suspect there is significant malignancy throughout most of the right breast. Discrete masses are seen at 11 o'clock and 6 o'clock.  A single borderline lymph node is identified. This lymph node appears prominent compared to the remainder of the lymph nodes. The skin thickening suggests the possibility of inflammatory breast cancer.   02/09/2022 Cancer Staging   Staging form: Breast, AJCC 8th Edition - Clinical stage from 02/09/2022: Stage IIIA (cT3, cN1, cM0, G2, ER+, PR-, HER2+) - Signed by [name], MD on 03/05/2022 Stage prefix: Initial diagnosis Histologic grading system: 3 grade system   02/09/2022 Initial Biopsy   Diagnosis 1. Breast, right, needle core biopsy, right breast 11 o'clock mass ribbon clip - INVASIVE MAMMARY CARCINOMA - SEE COMMENT 2. Breast, right, needle core biopsy, right breast 6'oclock mass, coil clip - INVASIVE MAMMARY CARCINOMA - SEE  COMMENT 3. Lymph node, needle/core biopsy, right axillary lymph node, tribell clip - METASTATIC CARCINOMA INVOLVING NODAL TISSUE Microscopic Comment 1. and 2. The biopsy material shows an infiltrative proliferation of cells arranged linearly and in small clusters. Based on the biopsy, the carcinoma appears Nottingham grade 2 of 3 and measures 1.5 cm in greatest linear extent.  Addendum parts 1 and 2: Immunohistochemistry for E-cadherin is negative consistent with lobular carcinoma.  2. PROGNOSTIC INDICATORS Results: The tumor cells are EQUIVOCAL for Her2 (2+). Her2 by FISH will be performed and the results reported separately. Estrogen Receptor: 100%, POSITIVE, STRONG STAINING INTENSITY Progesterone Receptor: <1%, NEGATIVE Proliferation Marker Ki67: 10%  2. FLUORESCENCE IN-SITU HYBRIDIZATION Results: GROUP 1: HER2 **POSITIVE**   3. PROGNOSTIC INDICATORS Results: By immunohistochemistry, the tumor cells are POSITIVE for Her2 (3+). Estrogen Receptor: 100%, POSITIVE, STRONG STAINING INTENSITY Progesterone Receptor: <1%, NEGATIVE   02/16/2022 Initial Diagnosis   Malignant neoplasm of overlapping sites of right breast in female, estrogen receptor positive (HCC)   03/01/2022 Imaging   EXAM: BILATERAL BREAST MRI WITH AND WITHOUT CONTRAST  IMPRESSION: 1. 8.5 x 8.3 x 5.9 cm area of confluent mass-like enhancement in the right breast involving all 4 quadrants and containing 2 biopsy marker clip artifacts, compatible with 4 quadrant biopsy-proven malignancy. 2. Biopsy-proven metastatic lymph node in the right axilla. 3. Possible metastatic intramammary lymph node in the posterior outer right breast just above the level of the nipple. 4. No evidence of malignancy on the left.   03/01/2022 Genetic Testing   Negative hereditary cancer genetic testing: no pathogenic variants detected in [facility] CustomNext-Cancer +RNAinsight Panel.  Variant of uncertain significance reported in BRIP1 at  p.F934V (c.2800T>G). Report  date is March 01, 2022.    The CustomNext-Cancer+RNAinsight panel offered by [employer] includes sequencing and rearrangement analysis for the following 47 genes:  APC, ATM, AXIN2, BARD1, BMPR1A, BRCA1, BRCA2, BRIP1, CDH1, CDK4, CDKN2A, CHEK2, DICER1, EPCAM, GREM1, HOXB13, MEN1, MLH1, MSH2, MSH3, MSH6, MUTYH, NBN, NF1, NF2, NTHL1, PALB2, PMS2, POLD1, POLE, PTEN, RAD51C, RAD51D, RECQL, RET, SDHA, SDHAF2, SDHB, SDHC, SDHD, SMAD4, SMARCA4, STK11, TP53, TSC1, TSC2, and VHL.  RNA data is routinely analyzed for use in variant interpretation for all genes.  UPDATE: BRIP1 p.F934V VUS has been amended to Likely Benign. Amended report date is 11/06/2022.    03/05/2022 Imaging   EXAM: CT CHEST, ABDOMEN, AND PELVIS WITH CONTRAST  IMPRESSION: 1. Asymmetric, masslike density of the glandular tissue of the right breast with overlying skin thickening, in keeping with known primary breast malignancy. 2. Enlarged, ill-defined right axillary lymph node containing a biopsy marking clip, consistent with known nodal metastatic disease. 3. No other evidence of lymphadenopathy or metastatic disease in the chest, abdomen, or pelvis. 4. Emphysema. Background of fine centrilobular nodularity, most concentrated in the lung apices, consistent with smoking-related respiratory bronchiolitis. 5. Aneurysm of the infrarenal abdominal aorta measuring up to 5.4 x 5.3 cm with a large burden of eccentric mural thrombus. Recommend follow-up CT/MR every 6 months and vascular consultation. This recommendation follows ACR consensus guidelines: White Paper of the ACR Incidental Findings Committee II on Vascular Findings. J Am Coll Radiol 2013; 10:789-794. 6. Coronary artery disease.   03/05/2022 Imaging   EXAM: NUCLEAR MEDICINE WHOLE BODY BONE SCAN  IMPRESSION: No evidence of bony metastatic disease.   03/07/2022 - 08/01/2022 Chemotherapy   Patient is on Treatment Plan : BREAST  Docetaxel  + Carboplatin  +  Trastuzumab  + Pertuzumab   (TCHP) q21d      07/26/2022 Surgery   Right mastectomy with axillary node seed guided excision converted to axillary node dissection with reconstruction by Drs. Ebbie and Thimmappa   07/26/2022 Pathology Results   FINAL MICROSCOPIC DIAGNOSIS:   A. LYMPH NODE, RIGHT AXILLARY TARGETED, EXCISION:  - Metastatic carcinoma involving one lymph node, 2 cm (1/1).  - Focal extranodal extension.  - Biopsy site and biopsy clip.   B. BREAST, RIGHT, MASTECTOMY:  - Invasive lobular carcinoma, 9.5 cm (ypT3).  - Carcinoma involves dermis of nipple.  - All surgical margins negative for carcinoma.  - One lymph node negative for metastatic carcinoma (0/1).  - Biopsy sites and biopsy clips.  - See oncology table.   C. LYMPH NODE, RIGHT AXILLARY, SENTINEL, EXCISION:  - One lymph node negative for metastatic carcinoma (0/1).   D. LYMPH NODE, RIGHT AXILLARY, SENTINEL, EXCISION:  - Metastatic carcinoma in one lymph node, 0.5 cm. (1/1).   E. LYMPH NODE, RIGHT AXILLARY, SENTINEL, EXCISION:  - One lymph node negative for metastatic carcinoma (0/1).    07/26/2022 Cancer Staging   Cancer Staging  Malignant neoplasm of overlapping sites of right breast in female, estrogen receptor positive (HCC) Staging form: Breast, AJCC 8th Edition - Clinical stage from 02/09/2022: Stage IIIA (cT3, cN1, cM0, G2, ER+, PR-, HER2+) - Signed by [name], MD on 03/05/2022 Stage prefix: Initial diagnosis Histologic grading system: 3 grade system - Pathologic stage from 07/26/2022: Stage IIIA (pT3, pN1a, cM0, G2, ER+, PR-, HER2+) - Signed by Ann Mayme POUR, NP on 08/01/2022 Histologic grading system: 3 grade system    07/26/2022 Cancer Staging   Staging form: Breast, AJCC 8th Edition - Pathologic stage from 07/26/2022: Stage IIIA (pT3, pN1a, cM0, G2, ER+,  PR-, HER2+) - Signed by Burton, Lacie K, NP on 08/01/2022 Histologic grading system: 3 grade system   08/24/2022 -  Chemotherapy   Patient is on  Treatment Plan : BREAST ADO-Trastuzumab Emtansine  (Kadcyla ) q21d     07/24/2023 Imaging   Echocardiogram  Normal LVF at 55-60 % Normal left diastolic function. Normal right ventricle  Normal visualized valves with preserved function   03/24/2024 Survivorship   SCP delivered by Lacie Burton, NP     Patient with a relevant cardiac history of atrial fibrillation that is followed by electrophysiology, AAA with prior open repair, and increased coronary artery calcifications.       Patient overall reports that she is doing well.  She is recovering well from her plastic surgery this past month.  She denies any cardiac complaints.  She notes that her heart rate is slow at baseline but she denies any shortness of breath with activity.  She remains on neratinib .  Blood pressure elevated today but reports that it is usually controlled at home.  She does not take lisinopril  with prior cough with the medication.   Medical history and current medications were reviewed in Epic as part of clinic visit.   PHYSICAL EXAM: There were no vitals filed for this visit.  GENERAL: Well nourished and in no apparent distress at rest.  PULM:  Normal work of breathing, clear to auscultation bilaterally. Respirations are unlabored.  CARDIAC:  JVP: flat         Normal rate with regular rhythm. No murmurs, rubs or gallops.  no edema. Warm and well perfused extremities. ABDOMEN: Soft, non-tender, non-distended. NEUROLOGIC: Patient is oriented x3 with no focal or lateralizing neurologic deficits.      ASSESSMENT & PLAN:  1. Cardiooncolgy - On Her 2 since 2023, was on pertuzumab  and trastuzumab  without great response to therapy, than on Kadcyla , now on neratinib . On cardioprotective therapy with carvedilol , tolerating well. - Continue carvedilol  6.25mg  BID, low threshold to dose reduce if symptomatic bradycardia - Baseline BNP normal - Reviewed limited echo from 03/2024, normal EF and GLS, repeat ordered for  August  Atrial fibrillation:  - Follows with EP - Continue eliquis  5mg  BID, can hold for procedures  Elevated CAC:  - Continue eliquis  as well as low dose crestor  5mg  daily - Would like to avoid additional therapy  AAA:  - S/p repair, eliquis  and statin as above  All parameters stable. Reviewed signs and symptoms of HF to look for. Ok to continue chemotherapy. Follow-up with limited echo in 2 months.   Morene Brownie, MD [facility] Mechanical Circulatory Support Cardio-Oncology 09/22/24

## 2024-09-23 ENCOUNTER — Ambulatory Visit (HOSPITAL_COMMUNITY)
Admission: RE | Admit: 2024-09-23 | Discharge: 2024-09-23 | Disposition: A | Discharge status: Home / Self Care | Source: Ambulatory Visit | Attending: Cardiology | Admitting: Cardiology

## 2024-09-23 ENCOUNTER — Other Ambulatory Visit: Payer: Self-pay

## 2024-09-23 ENCOUNTER — Encounter: Payer: Self-pay | Admitting: Hematology

## 2024-09-23 ENCOUNTER — Other Ambulatory Visit (HOSPITAL_COMMUNITY): Payer: Self-pay

## 2024-09-23 VITALS — BP 124/66 | HR 51 | Wt 164.6 lb

## 2024-09-23 DIAGNOSIS — Z170 Estrogen receptor positive status [ER+]: Secondary | ICD-10-CM | POA: Diagnosis not present

## 2024-09-23 DIAGNOSIS — I2510 Atherosclerotic heart disease of native coronary artery without angina pectoris: Secondary | ICD-10-CM | POA: Insufficient documentation

## 2024-09-23 DIAGNOSIS — C50811 Malignant neoplasm of overlapping sites of right female breast: Secondary | ICD-10-CM | POA: Diagnosis not present

## 2024-09-23 DIAGNOSIS — Z79899 Other long term (current) drug therapy: Secondary | ICD-10-CM | POA: Diagnosis not present

## 2024-09-23 DIAGNOSIS — I700 Atherosclerosis of aorta: Secondary | ICD-10-CM | POA: Diagnosis not present

## 2024-09-23 DIAGNOSIS — I4819 Other persistent atrial fibrillation: Secondary | ICD-10-CM | POA: Diagnosis not present

## 2024-09-23 DIAGNOSIS — Z7901 Long term (current) use of anticoagulants: Secondary | ICD-10-CM | POA: Diagnosis not present

## 2024-09-23 MED ORDER — CARVEDILOL 3.125 MG PO TABS
3.1250 mg | ORAL_TABLET | Freq: Two times a day (BID) | ORAL | 3 refills | Status: AC
  Filled 2024-09-23: qty 180, 90d supply, fill #0
  Filled 2024-11-25 – 2024-11-30 (×2): qty 180, 90d supply, fill #1

## 2024-09-23 NOTE — Patient Instructions (Signed)
 STOP Amiodarone   CHANGE Carvedilol  to 3.125 mg Twice daily  Your physician has requested that you have an echocardiogram. Echocardiography is a painless test that uses sound waves to create images of your heart. It provides your doctor with information about the size and shape of your heart and how well your heart's chambers and valves are working. This procedure takes approximately one hour. There are no restrictions for this procedure. Please do NOT wear cologne, perfume, aftershave, or lotions (deodorant is allowed). Please arrive 15 minutes prior to your appointment time.  Please note: We ask at that you not bring children with you during ultrasound (echo/ vascular) testing. Due to room size and safety concerns, children are not allowed in the ultrasound rooms during exams. Our front office staff cannot provide observation of children in our lobby area while testing is being conducted. An adult accompanying a patient to their appointment will only be allowed in the ultrasound room at the discretion of the ultrasound technician under special circumstances. We apologize for any inconvenience.  Your physician recommends that you schedule a follow-up appointment in: 6 months with an echocardiogram ( April 2026) ** PLEASE CALL THE OFFICE IN FEBRUARY 2026 TO ARRANGE YOUR FOLLOW UP APPOINTMENT.**  If you have any questions or concerns before your next appointment please send us  a message through [place] or call our office at [phone].    TO LEAVE A MESSAGE FOR THE NURSE SELECT OPTION 2, PLEASE LEAVE A MESSAGE INCLUDING: YOUR NAME DATE OF BIRTH CALL BACK NUMBER REASON FOR CALL**this is important as we prioritize the call backs  YOU WILL RECEIVE A CALL BACK THE SAME DAY AS LONG AS YOU CALL BEFORE 4:00 PM  At the [facility], you and your health needs are our priority. As part of our continuing mission to provide you with exceptional heart care, we have created designated  Provider Care Teams. These Care Teams include your primary Cardiologist (physician) and Advanced Practice Providers (APPs- Physician Assistants and Nurse Practitioners) who all work together to provide you with the care you need, when you need it.   You may see any of the following providers on your designated Care Team at your next follow up: Dr Toribio Fuel Dr Ezra Shuck Dr. Ria Commander Dr. Morene Brownie Amy Lenetta, NP Caffie Shed, [place] [facility] [place], [place] Beckey Coe, NP [place] Lee, NP Ellouise Class, NP Tinnie Redman, PharmD Jaun Bash, PharmD   Please be sure to bring in all your medications bottles to every appointment.    Thank you for choosing [facility]

## 2024-09-24 ENCOUNTER — Other Ambulatory Visit: Payer: Self-pay

## 2024-09-24 ENCOUNTER — Other Ambulatory Visit (HOSPITAL_COMMUNITY): Payer: Self-pay

## 2024-09-24 DIAGNOSIS — Z6824 Body mass index (BMI) 24.0-24.9, adult: Secondary | ICD-10-CM | POA: Diagnosis not present

## 2024-09-24 DIAGNOSIS — L980 Pyogenic granuloma: Secondary | ICD-10-CM | POA: Diagnosis not present

## 2024-09-24 DIAGNOSIS — L739 Follicular disorder, unspecified: Secondary | ICD-10-CM | POA: Diagnosis not present

## 2024-09-24 DIAGNOSIS — Z23 Encounter for immunization: Secondary | ICD-10-CM | POA: Diagnosis not present

## 2024-09-24 MED ORDER — DOXYCYCLINE HYCLATE 100 MG PO TABS
100.0000 mg | ORAL_TABLET | Freq: Two times a day (BID) | ORAL | 0 refills | Status: DC
  Filled 2024-09-24 (×2): qty 20, 10d supply, fill #0

## 2024-09-28 ENCOUNTER — Ambulatory Visit

## 2024-09-28 ENCOUNTER — Other Ambulatory Visit (HOSPITAL_BASED_OUTPATIENT_CLINIC_OR_DEPARTMENT_OTHER): Payer: Self-pay

## 2024-09-29 ENCOUNTER — Ambulatory Visit: Attending: Plastic Surgery | Admitting: Rehabilitation

## 2024-09-29 ENCOUNTER — Encounter: Payer: Self-pay | Admitting: Rehabilitation

## 2024-09-29 DIAGNOSIS — C50411 Malignant neoplasm of upper-outer quadrant of right female breast: Secondary | ICD-10-CM | POA: Insufficient documentation

## 2024-09-29 DIAGNOSIS — Z170 Estrogen receptor positive status [ER+]: Secondary | ICD-10-CM | POA: Insufficient documentation

## 2024-09-29 DIAGNOSIS — Z483 Aftercare following surgery for neoplasm: Secondary | ICD-10-CM | POA: Insufficient documentation

## 2024-09-29 DIAGNOSIS — L980 Pyogenic granuloma: Secondary | ICD-10-CM | POA: Diagnosis not present

## 2024-09-29 DIAGNOSIS — Z6824 Body mass index (BMI) 24.0-24.9, adult: Secondary | ICD-10-CM | POA: Diagnosis not present

## 2024-09-29 NOTE — Therapy (Signed)
 OUTPATIENT PHYSICAL THERAPY SOZO SCREENING NOTE   Patient Name: Ashley Robertson MRN: [id number] DOB:[birth year], 68 y.o., female Today's Date: 09/29/2024  PCP: [name], MD REFERRING PROVIDER: [name] Filippo, MD   PT End of Session - 09/29/24 0813     Visit Number 2   screen only   PT Start Time 0800    PT Stop Time 0806    PT Time Calculation (min) 6 min    Activity Tolerance Patient tolerated treatment well    Behavior During Therapy [facility] for tasks assessed/performed           Past Medical History:  Diagnosis Date   AAA (abdominal aortic aneurysm) 01/11/2023   Allergy    seasonal - on claritin   Anxiety and depression    Aortic atherosclerosis 03/2020   CT Chest: 2 V (LAD & LCx) Coronary Atherosclerosis, Aortic Atherosclerosis (no aneurysm).  Mild centrilobular emphysema with mild diffuse bronchial thickening; several scattered small solitary pulmonary nodules (largest 5.6 mm in anterior left upper lobe)   Breast cancer (HCC) 12/2021   Right breast ILC   Carotid artery plaque, bilateral 10/2015   Mild to moderate plaque L>R without significant stenosis   COPD (chronic obstructive pulmonary disease) (HCC)    Coronary Artery Calcification - Score 79    Coronary Calcium  Score 79.  LAD and circumflex calcification noted.  Normal ascending aorta with mild calcification.   Current every day smoker    pt quit smoking April 2023   Dysrhythmia 01/2023   developed post op a-fib after AAA repair, converted with amiodarone    Emphysema lung (HCC)    Noted on chest CT   Family history of breast cancer 02/21/2022   Family history of prostate cancer 02/21/2022   Hyperlipidemia    Hypertension    Controlled with amlodipine    Pre-diabetes    hx , no meds   Skin cancer 2021   Remove 2022 on left shin   Past Surgical History:  Procedure Laterality Date   ABDOMINAL AORTIC ANEURYSM REPAIR N/A 01/11/2023   Procedure: ANEURYSM ABDOMINAL AORTIC REPAIR;  Surgeon: Serene Gaile ORN, MD;  Location: [facility] OR;  Service: Vascular;  Laterality: N/A;   BREAST BIOPSY Right    times 3   BREAST RECONSTRUCTION WITH PLACEMENT OF TISSUE EXPANDER AND ALLODERM Right 07/26/2022   Procedure: RIGHT BREAST RECONSTRUCTION WITH PLACEMENT OF TISSUE EXPANDER AND ALLODERM;  Surgeon: [name] Filippo, MD;  Location: [facility] OR;  Service: Plastics;  Laterality: Right;   CESAREAN SECTION  1990, 1991   COSMETIC SURGERY  1981   Rhinoplasty   INCISION AND DRAINAGE OF WOUND Right 03/12/2024   Procedure: IRRIGATION AND DEBRIDEMENT WOUND;  Surgeon: [name], MD;  Location[facility];  Service: General;  Laterality: Right;  DEBRIDEMENT RIGHT CHEST WALL   MASTECTOMY Right    MASTECTOMY W/ SENTINEL NODE BIOPSY Right 07/26/2022   Procedure: RIGHT MASTECTOMY, RIGHT AXILLARY SENTINEL NODE BIOPSY;  Surgeon: [name], MD;  Location: [facility] OR;  Service: General;  Laterality: Right;  GEN & PEC BLOCK   MASTOPEXY Left 12/20/2023   Procedure: MASTOPEXY;  Surgeon: [name] Filippo, MD;  Location: [facility];  Service: Plastics;  Laterality: Left;   PORT-A-CATH REMOVAL Left 12/20/2023   Procedure: REMOVAL PORT-A-CATH;  Surgeon: [name] Filippo, MD;  Location: [facility];  Service: Plastics;  Laterality: Left;   PORTACATH PLACEMENT Left 03/06/2022   Procedure: INSERTION PORT-A-CATH;  Surgeon: [name], MD;  Location: [facility];  Service: General;  Laterality: Left;   RADIOACTIVE SEED GUIDED AXILLARY SENTINEL LYMPH NODE Right 07/26/2022   Procedure: RADIOACTIVE SEED GUIDED AXILLARY SENTINEL LYMPH NODE DISSECTION;  Surgeon: [name], MD;  Location: [facility] OR;  Service: General;  Laterality: Right;   REMOVAL OF TISSUE EXPANDER AND PLACEMENT OF IMPLANT Right 12/20/2023   Procedure: REMOVAL OF TISSUE EXPANDER AND PLACEMENT OF SILICONE IMPLANT;  Surgeon: [name] Filippo, MD;  Location: [facility];  Service: Plastics;  Laterality:  Right;   REMOVAL OF TISSUE EXPANDER AND PLACEMENT OF IMPLANT Right 03/07/2024   Procedure: REMOVAL RIGHT CHEST IMPLANT;  Surgeon: [name] Filippo, MD;  Location: [facility] OR;  Service: Plastics;  Laterality: Right;  REMOVAL OF IMPLANT   RHINOPLASTY  1981   SKIN SPLIT GRAFT Right 04/13/2024   Procedure: APPLICATION, GRAFT, SKIN, SPLIT-THICKNESS;  Surgeon: [name] Filippo, MD;  Location: [facility] OR;  Service: Plastics;  Laterality: Right;  SPLIT THICKNESS SKIN GRAFT FROM RIGHT THIGH TO RIGHT CHEST   TONSILLECTOMY  1978   TRANSTHORACIC ECHOCARDIOGRAM  10/2016   EF 55 to 60%.  Normal systolic and diastolic function.  No ASD/PFO   TUBAL LIGATION  1991   Patient Active Problem List   Diagnosis Date Noted   MSSA (methicillin susceptible Staphylococcus aureus) infection 03/16/2024   Infection of breast implant 03/16/2024   Cellulitis of chest wall 03/12/2024   Atrial fibrillation (HCC) 03/07/2024   Hypokalemia 03/07/2024   S/P AAA repair 01/11/2023   Osteopenia 10/21/2022   Aortic atherosclerosis 08/10/2022   S/P mastectomy, right 07/26/2022   Carotid atherosclerosis 03/28/2022   Coronary artery disease 03/28/2022   Pulmonary emphysema (HCC) 03/28/2022   Recurrent major depression in remission 03/28/2022   Port-A-Cath in place 03/27/2022   Genetic testing 03/12/2022   Family history of breast cancer 02/21/2022   Family history of prostate cancer 02/21/2022   Hypercholesterolemia 02/21/2022   Malignant neoplasm of overlapping sites of right breast in female, estrogen receptor positive (HCC) 02/16/2022   Bilateral carotid bruits 04/26/2020   Essential hypertension 03/19/2020   Hyperlipidemia with target LDL less than 100 03/19/2020   Prediabetes 03/19/2020   Agatston CAC score, <100 03/15/2020    REFERRING DIAG: Rt breast cancer at risk for lymphedema  THERAPY DIAG: Aftercare following surgery for neoplasm  Malignant neoplasm of upper-outer quadrant of right breast in female, estrogen receptor  positive (HCC)  PERTINENT HISTORY: Patient was diagnosed on 02/08/2022 with right grade II invasive lobular carcinoma breast cancer. It measured 2.6 cm and 3.3 cm (2 masses) and is located in the upper outer quadrant. It is ER positive, PR negative, and HER2 positive with a Ki67 of 10%. Patient has COPD and hypertension and a 5cm AAA. She is-s/p right mastectomy  with SLNB 07/26/2022 by Dr. Ebbie and reconstruction by Dr. [name]. Path showed 9.5 cm invasive lobular carcinoma involving dermis of nipple, margins negative, 2/5 positive lymph nodes. Given the significant residual disease, the recommendation is for postmastectomy radiation and switch to adjuvant Kadcyla . 12/20/23 - Reconstruction, implant on R and reduction on left. In April had to have debridement, then implant removed and then had a wound vac placed.   PRECAUTIONS: right UE Lymphedema risk, None  SUBJECTIVE: Pt returns for her 3 month L-Dex screen. I've had an infection in my breast since I was here last. Then they had to remove my implant due to necrotic tissue and I had a wound vac for awhile. It was crazy but I'm doing better now.  PAIN:  Are you having pain? No  SOZO SCREENING: Patient was assessed today using the SOZO machine to determine the lymphedema index score. This was compared to her baseline score. It was determined that she is within the recommended range when compared to her baseline and no further action is needed at this time. She will continue SOZO screenings. These are done every 3 months for 2 years post operatively followed by every 6 months for 2 years, and then annually.    L-DEX FLOWSHEETS - 09/29/24 0800       L-DEX LYMPHEDEMA SCREENING   Measurement Type Unilateral    L-DEX MEASUREMENT EXTREMITY Upper Extremity    POSITION  Standing    DOMINANT SIDE Right    At Risk Side Right    BASELINE SCORE (UNILATERAL) -0.2    L-DEX SCORE (UNILATERAL) 1    VALUE CHANGE (UNILAT) 1.2              Nickisha Hum R, PT 09/29/2024, 8:15 AM

## 2024-09-30 ENCOUNTER — Other Ambulatory Visit: Payer: Self-pay

## 2024-10-01 ENCOUNTER — Other Ambulatory Visit (HOSPITAL_COMMUNITY): Payer: Self-pay

## 2024-10-02 ENCOUNTER — Other Ambulatory Visit: Payer: Self-pay | Admitting: Pulmonary Disease

## 2024-10-02 DIAGNOSIS — F339 Major depressive disorder, recurrent, unspecified: Secondary | ICD-10-CM | POA: Diagnosis not present

## 2024-10-02 DIAGNOSIS — I10 Essential (primary) hypertension: Secondary | ICD-10-CM | POA: Diagnosis not present

## 2024-10-02 DIAGNOSIS — I2510 Atherosclerotic heart disease of native coronary artery without angina pectoris: Secondary | ICD-10-CM | POA: Diagnosis not present

## 2024-10-02 DIAGNOSIS — J439 Emphysema, unspecified: Secondary | ICD-10-CM | POA: Diagnosis not present

## 2024-10-02 DIAGNOSIS — C50919 Malignant neoplasm of unspecified site of unspecified female breast: Secondary | ICD-10-CM | POA: Diagnosis not present

## 2024-10-02 DIAGNOSIS — E7800 Pure hypercholesterolemia, unspecified: Secondary | ICD-10-CM | POA: Diagnosis not present

## 2024-10-05 ENCOUNTER — Other Ambulatory Visit (HOSPITAL_COMMUNITY): Payer: Self-pay

## 2024-10-05 MED ORDER — TRELEGY ELLIPTA 200-62.5-25 MCG/ACT IN AEPB
1.0000 | INHALATION_SPRAY | Freq: Every day | RESPIRATORY_TRACT | 0 refills | Status: DC
  Filled 2024-10-05: qty 60, 30d supply, fill #0

## 2024-10-05 NOTE — Telephone Encounter (Signed)
 Courtesy refill. Pt needs office visit for further refills.

## 2024-10-08 ENCOUNTER — Other Ambulatory Visit (HOSPITAL_COMMUNITY): Payer: Self-pay

## 2024-10-08 MED ORDER — LIDOCAINE VISCOUS HCL 2 % MT SOLN
5.0000 mL | Freq: Four times a day (QID) | OROMUCOSAL | 0 refills | Status: AC
  Filled 2024-10-08: qty 480, 12d supply, fill #0

## 2024-10-12 ENCOUNTER — Other Ambulatory Visit (HOSPITAL_COMMUNITY): Payer: Self-pay

## 2024-10-12 ENCOUNTER — Telehealth: Payer: Self-pay

## 2024-10-12 ENCOUNTER — Encounter: Payer: Self-pay | Admitting: Hematology

## 2024-10-12 NOTE — Telephone Encounter (Signed)
 Oral Oncology Patient Advocate Encounter  Was successful in securing patient a $7500 grant from [facility] to provide copayment coverage for Nerlynx .  This will keep the out of pocket expense at $0.     [employer] ID: [id number]   The billing information is as follows and has been shared with [facility].    RxBin: [id number] PCN: [id number] Member ID: [id number] Group ID: [id number] Dates of Eligibility: 09/08/24 through 09/07/25  Fund:  Breast  Lucie Lamer, CPhT [place]  [facility] Oncology Pharmacy Patient Advocate Specialist II [place] Phone: [phone]  Fax: [phone] [email or web address]

## 2024-10-16 DIAGNOSIS — J439 Emphysema, unspecified: Secondary | ICD-10-CM | POA: Diagnosis not present

## 2024-10-16 DIAGNOSIS — I10 Essential (primary) hypertension: Secondary | ICD-10-CM | POA: Diagnosis not present

## 2024-10-16 DIAGNOSIS — I2510 Atherosclerotic heart disease of native coronary artery without angina pectoris: Secondary | ICD-10-CM | POA: Diagnosis not present

## 2024-10-25 ENCOUNTER — Other Ambulatory Visit: Payer: Self-pay | Admitting: Pulmonary Disease

## 2024-10-26 ENCOUNTER — Other Ambulatory Visit: Payer: Self-pay

## 2024-10-26 ENCOUNTER — Other Ambulatory Visit (HOSPITAL_COMMUNITY): Payer: Self-pay

## 2024-10-26 ENCOUNTER — Ambulatory Visit (HOSPITAL_COMMUNITY): Admitting: Internal Medicine

## 2024-10-26 MED ORDER — TRELEGY ELLIPTA 200-62.5-25 MCG/ACT IN AEPB
1.0000 | INHALATION_SPRAY | Freq: Every day | RESPIRATORY_TRACT | 0 refills | Status: DC
  Filled 2024-10-26 – 2024-10-28 (×2): qty 60, 30d supply, fill #0

## 2024-10-26 NOTE — Telephone Encounter (Signed)
 Courtesy refill, pt needs an appt.

## 2024-10-27 ENCOUNTER — Encounter (HOSPITAL_COMMUNITY): Payer: Self-pay | Admitting: Internal Medicine

## 2024-10-27 ENCOUNTER — Ambulatory Visit (HOSPITAL_COMMUNITY)
Admission: RE | Admit: 2024-10-27 | Discharge: 2024-10-27 | Disposition: A | Discharge status: Home / Self Care | Source: Ambulatory Visit | Attending: Internal Medicine | Admitting: Internal Medicine

## 2024-10-27 VITALS — BP 136/76 | HR 57 | Ht 69.0 in | Wt 166.6 lb

## 2024-10-27 DIAGNOSIS — I4819 Other persistent atrial fibrillation: Secondary | ICD-10-CM

## 2024-10-27 DIAGNOSIS — D6869 Other thrombophilia: Secondary | ICD-10-CM | POA: Diagnosis not present

## 2024-10-27 NOTE — Patient Instructions (Signed)
 Apple watch series 4 or later Kardia mobile device

## 2024-10-27 NOTE — Progress Notes (Signed)
 Primary Care Physician: [name], MD Primary Cardiologist: Alm Clay, MD Electrophysiologist: OLE ONEIDA HOLTS, MD     Referring Physician: Dr. Zenaida Clarita LITTIE Ashley Robertson is a 68 y.o. female with a history of breast cancer diagnosed in 2023 postmastectomy, XRT, hypertension, AAA, and atrial fibrillation who presents for consultation in the [facility].  Amiodarone  stopped by Dr. Zenaida 09/2024.  Patient is on Eliquis  for stroke prevention.  On evaluation today, patient is currently in NSR.  Patient did stop the amiodarone  as directed by Dr. Zenaida last month.  She has had no A-fib episodes since stopping amiodarone .  No bleeding issues on Eliquis .  Today, she denies symptoms of palpitations, chest pain, shortness of breath, orthopnea, PND, lower extremity edema, dizziness, presyncope, syncope, bleeding, or neurologic sequela. The patient is tolerating medications without difficulties and is otherwise without complaint today.    she has a BMI of Body mass index is 24.6 kg/m.SABRA Filed Weights   10/27/24 0819  Weight: 75.6 kg    Current Outpatient Medications  Medication Sig Dispense Refill   acetaminophen  (TYLENOL ) 325 MG tablet Take 2 tablets (650 mg total) by mouth every 6 (six) hours as needed for mild pain (pain score 1-3), fever or headache.     albuterol  (PROVENTIL  HFA) 108 (90 Base) MCG/ACT inhaler Inhale 1 to 2 puffs into the lungs every 6 hours as needed 8.5 g 2   albuterol  (VENTOLIN  HFA) 108 (90 Base) MCG/ACT inhaler Inhale 2 puffs into the lungs every 6 (six) hours as needed for wheezing or shortness of breath. 6.7 g 1   alendronate  (FOSAMAX ) 70 MG tablet Take 1 tablet (70 mg total) by mouth once a week 30 minutes before the first food, beverage or medicine of the day with plain water 12 tablet 4   amLODipine  (NORVASC ) 10 MG tablet Take 1 tablet (10 mg total) by mouth at bedtime. 90 tablet 3   anastrozole  (ARIMIDEX ) 1 MG tablet Take 1  tablet (1 mg) by mouth daily. 90 tablet 3   apixaban  (ELIQUIS ) 5 MG TABS tablet Take 1 tablet (5 mg total) by mouth 2 (two) times daily. 180 tablet 3   Calcium  Carb-Cholecalciferol  (CALCIUM  + VITAMIN D3 PO) Take 1 tablet by mouth at bedtime.     carvedilol  (COREG ) 3.125 MG tablet Take 1 tablet (3.125 mg total) by mouth 2 (two) times daily. 180 tablet 3   Cholecalciferol  (VITAMIN D -3 PO) Take 1 tablet by mouth at bedtime.     ezetimibe  (ZETIA ) 10 MG tablet Take 1 tablet (10 mg total) by mouth daily. 90 tablet 2   ezetimibe  (ZETIA ) 10 MG tablet Take 1 tablet (10 mg total) by mouth daily. 90 tablet 4   FLUoxetine  (PROZAC ) 20 MG capsule Take 1 capsule (20 mg total) by mouth daily. 90 capsule 4   Fluticasone -Umeclidin-Vilant (TRELEGY ELLIPTA ) 200-62.5-25 MCG/ACT AEPB Inhale 1 puff into the lungs daily. 60 each 0   magic mouthwash (lidocaine , diphenhydrAMINE , alum & mag hydroxide) suspension Swish, gargle, and spit 5-10 mLs for 1 minute 4 (four) times daily as directed. 480 mL 0   [name]  Maleate (NERLYNX ) 40 MG tablet Take 6 tablets (240 mg total) by mouth daily. Take with food. 180 tablet 4   Prenatal Vit-Fe Fumarate-FA (PRENATAL PO) Take 1 tablet by mouth at bedtime.     rosuvastatin  (CRESTOR ) 5 MG tablet Take 1 tablet by mouth daily at bedtime ,except on Monday, Wednesday and Fridays take 2 tablets at  bedtime as directed 180 tablet 3   No current facility-administered medications for this encounter.    Atrial Fibrillation Management history:  Previous antiarrhythmic drugs: Amiodarone  Previous cardioversions: None Previous ablations: None Anticoagulation history: Eliquis    ROS- All systems are reviewed and negative except as per the HPI above.  Physical Exam: BP 136/76   Pulse (!) 57   Ht 5' 9 (1.753 m)   Wt 75.6 kg   BMI 24.60 kg/m   GEN: Well nourished, well developed in no acute distress NECK: No JVD; No carotid bruits CARDIAC: Regular rate and rhythm, no murmurs, rubs,  gallops RESPIRATORY:  Clear to auscultation without rales, wheezing or rhonchi  ABDOMEN: Soft, non-tender, non-distended EXTREMITIES:  No edema; No deformity   EKG today demonstrates  EKG Interpretation Date/Time:  Tuesday October 27 2024 08:21:39 EST Ventricular Rate:  57 PR Interval:  192 QRS Duration:  98 QT Interval:  450 QTC Calculation: 438 R Axis:   51  Text Interpretation: Sinus bradycardia Otherwise normal ECG When compared with ECG of 23-Sep-2024 08:48, No significant change was found Confirmed by Terra Pac ([id number]) on 10/27/2024 8:36:04 AM    Echo 07/24/2024 demonstrated   1. Left ventricular ejection fraction, by estimation, is 60 to 65%. The  left ventricle has normal function. The average left ventricular global  longitudinal strain is -18.4 %. The global longitudinal strain is normal.   2. Right ventricular systolic function is normal. The right ventricular  size is normal.   3. There is mild calcification of the aortic valve. Aortic valve  sclerosis/calcification is present, without any evidence of aortic  stenosis.   4. The inferior vena cava is dilated in size with <50% respiratory  variability, suggesting right atrial pressure of 15 mmHg.   ASSESSMENT & PLAN CHA2DS2-VASc Score = 3  The patient's score is based upon: CHF History: 0 HTN History: 1 Diabetes History: 0 Stroke History: 0 Vascular Disease History: 0 Age Score: 1 Gender Score: 1       ASSESSMENT AND PLAN: Persistent Atrial Fibrillation (ICD10:  I48.19) The patient's CHA2DS2-VASc score is 3, indicating a 3.2% annual risk of stroke.    Patient is currently in NSR.  She has had no A-fib since stopping the amiodarone  last month with Dr. Zenaida.  We discussed rhythm monitoring device to assess burden going forward.  It is likely that patient will purchase an Apple Watch since she has an Theme [employer].   Secondary Hypercoagulable State (ICD10:  D68.69) The patient is at significant risk for  stroke/thromboembolism based upon her CHA2DS2-VASc Score of 3.  Continue Apixaban  (Eliquis ).  Continue Eliquis  5 mg twice daily.   Follow up 6 months A-fib clinic.   Terra Pac, [facility] [address] [place], [place] [address] [phone]

## 2024-10-28 ENCOUNTER — Other Ambulatory Visit: Payer: Self-pay

## 2024-10-28 ENCOUNTER — Other Ambulatory Visit (HOSPITAL_COMMUNITY): Payer: Self-pay

## 2024-11-01 DIAGNOSIS — I2510 Atherosclerotic heart disease of native coronary artery without angina pectoris: Secondary | ICD-10-CM | POA: Diagnosis not present

## 2024-11-01 DIAGNOSIS — J439 Emphysema, unspecified: Secondary | ICD-10-CM | POA: Diagnosis not present

## 2024-11-01 DIAGNOSIS — E7800 Pure hypercholesterolemia, unspecified: Secondary | ICD-10-CM | POA: Diagnosis not present

## 2024-11-01 DIAGNOSIS — F339 Major depressive disorder, recurrent, unspecified: Secondary | ICD-10-CM | POA: Diagnosis not present

## 2024-11-01 DIAGNOSIS — C50919 Malignant neoplasm of unspecified site of unspecified female breast: Secondary | ICD-10-CM | POA: Diagnosis not present

## 2024-11-01 DIAGNOSIS — I10 Essential (primary) hypertension: Secondary | ICD-10-CM | POA: Diagnosis not present

## 2024-11-03 ENCOUNTER — Other Ambulatory Visit (HOSPITAL_COMMUNITY): Payer: Self-pay

## 2024-11-03 DIAGNOSIS — I480 Paroxysmal atrial fibrillation: Secondary | ICD-10-CM | POA: Diagnosis not present

## 2024-11-03 DIAGNOSIS — I10 Essential (primary) hypertension: Secondary | ICD-10-CM | POA: Diagnosis not present

## 2024-11-03 DIAGNOSIS — Z23 Encounter for immunization: Secondary | ICD-10-CM | POA: Diagnosis not present

## 2024-11-03 DIAGNOSIS — Z6825 Body mass index (BMI) 25.0-25.9, adult: Secondary | ICD-10-CM | POA: Diagnosis not present

## 2024-11-03 DIAGNOSIS — F331 Major depressive disorder, recurrent, moderate: Secondary | ICD-10-CM | POA: Diagnosis not present

## 2024-11-03 DIAGNOSIS — M810 Age-related osteoporosis without current pathological fracture: Secondary | ICD-10-CM | POA: Diagnosis not present

## 2024-11-03 DIAGNOSIS — E7800 Pure hypercholesterolemia, unspecified: Secondary | ICD-10-CM | POA: Diagnosis not present

## 2024-11-03 DIAGNOSIS — J439 Emphysema, unspecified: Secondary | ICD-10-CM | POA: Diagnosis not present

## 2024-11-03 DIAGNOSIS — L739 Follicular disorder, unspecified: Secondary | ICD-10-CM | POA: Diagnosis not present

## 2024-11-03 DIAGNOSIS — Z79899 Other long term (current) drug therapy: Secondary | ICD-10-CM | POA: Diagnosis not present

## 2024-11-03 DIAGNOSIS — C50919 Malignant neoplasm of unspecified site of unspecified female breast: Secondary | ICD-10-CM | POA: Diagnosis not present

## 2024-11-03 MED ORDER — SULFAMETHOXAZOLE-TRIMETHOPRIM 800-160 MG PO TABS
1.0000 | ORAL_TABLET | Freq: Two times a day (BID) | ORAL | 1 refills | Status: AC
  Filled 2024-11-03: qty 20, 10d supply, fill #0
  Filled 2024-12-11: qty 20, 10d supply, fill #1

## 2024-11-09 NOTE — Progress Notes (Unsigned)
  Electrophysiology Office Follow up Visit Note:    Date:  11/09/2024   ID:  KELLE RUPPERT, DOB [birth year], MRN [id number]  PCP:  [name], MD  [facility] Cardiologist:  Alm Clay, MD  [facility] Electrophysiologist:  OLE ONEIDA HOLTS, MD    Interval History:     Ashley Robertson is a 68 y.o. female who presents for a follow up visit.   I last saw the patient Apr 29, 2024.  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  She has a history of atrial fibrillation and was on amiodarone  at the time of her last appointment.  We planned to follow-up in 6 months after her most recent appointment.  If she was maintaining sinus rhythm, favored stopping amiodarone . She saw [name] in the atrial fibrillation clinic October 27, 2024.  The amiodarone  was stopped by Dr. Zenaida in October.  At the appointment with [name] she was maintaining sinus rhythm.       Past medical, surgical, social and family history were reviewed.  ROS:   Please see the history of present illness.    All other systems reviewed and are negative.  EKGs/Labs/Other Studies Reviewed:    The following studies were reviewed today:          Physical Exam:    VS:  There were no vitals taken for this visit.    Wt Readings from Last 3 Encounters:  10/27/24 166 lb 9.6 oz (75.6 kg)  09/23/24 164 lb 9.6 oz (74.7 kg)  08/24/24 171 lb 4.8 oz (77.7 kg)     GEN: no distress CARD: RRR, No MRG RESP: No IWOB. CTAB.      ASSESSMENT:    No diagnosis found. PLAN:    In order of problems listed above:  #Persistent atrial fibrillation Maintaining sinus rhythm off amiodarone *** Continue Eliquis  for stroke prophylaxis  #Hypertension *** goal today.  Recommend checking blood pressures 1-2 times per week at home and recording the values.  Recommend bringing these recordings to the primary care physician.  I discussed my upcoming departure from [facility] during today's clinic  appointment.  The patient will continue to follow-up with one of my EP partners moving forward.  Follow-up with EP APP in 1 year  Signed, Ole Holts, MD, [facility], [facility] 11/09/2024 2:41 PM    Electrophysiology [facility]

## 2024-11-12 ENCOUNTER — Encounter: Payer: Self-pay | Admitting: Cardiology

## 2024-11-12 ENCOUNTER — Ambulatory Visit: Attending: Internal Medicine | Admitting: Cardiology

## 2024-11-12 VITALS — BP 120/84 | HR 46 | Ht 69.0 in | Wt 164.0 lb

## 2024-11-12 DIAGNOSIS — I4819 Other persistent atrial fibrillation: Secondary | ICD-10-CM | POA: Diagnosis not present

## 2024-11-12 DIAGNOSIS — I10 Essential (primary) hypertension: Secondary | ICD-10-CM | POA: Diagnosis not present

## 2024-11-25 ENCOUNTER — Other Ambulatory Visit (HOSPITAL_COMMUNITY): Payer: Self-pay

## 2024-12-02 ENCOUNTER — Other Ambulatory Visit: Payer: Self-pay | Admitting: Pulmonary Disease

## 2024-12-04 ENCOUNTER — Other Ambulatory Visit: Payer: Self-pay | Admitting: Pulmonary Disease

## 2024-12-04 ENCOUNTER — Other Ambulatory Visit: Payer: Self-pay

## 2024-12-04 ENCOUNTER — Telehealth: Payer: Self-pay | Admitting: Pulmonary Disease

## 2024-12-04 ENCOUNTER — Other Ambulatory Visit (HOSPITAL_COMMUNITY): Payer: Self-pay

## 2024-12-04 MED ORDER — TRELEGY ELLIPTA 200-62.5-25 MCG/ACT IN AEPB
1.0000 | INHALATION_SPRAY | Freq: Every day | RESPIRATORY_TRACT | 0 refills | Status: DC
  Filled 2024-12-04: qty 60, 30d supply, fill #0

## 2024-12-04 NOTE — Telephone Encounter (Signed)
 Copied from CRM #[id number]. Topic: Clinical - Medication Refill >> Dec 04, 2024  2:02 PM Leila C wrote: Most Recent Pulmonary Care Visit:  Provider: Dr. Praveen Mannam Department: [facility]monary Care Date: 09/25/24 Medication(s):  Fluticasone -Umeclidin-Vilant (TRELEGY ELLIPTA ) 200-62.5-25 MCG/ACT AEPB  Has the patient contacted their pharmacy? Yes. Patient states [facility] sent a request 12/02/24 for Fluticasone -Umeclidin-Vilant (TRELEGY ELLIPTA ) 200-62.5-25 MCG/ACT AEPB. Patient has one dosage left for tomorrow, and will be out of medication.  (Agent: If no, request that the patient contact the pharmacy for the refill. If patient does not wish to contact the pharmacy document the reason why and proceed with request.) (Agent: If yes, when and what did the pharmacy advise?)  This is the patient's preferred pharmacy:  [place] [facility] [address] [place] [address] Phone: [phone] Fax: [phone]  Is this the correct pharmacy for this prescription? Yes If no, delete pharmacy and type the correct one.   Has the prescription been filled recently? No  Is the patient out of the medication? No. Patient has one dosage left for tomorrow, and will be out of medication.   Has the patient been seen for an appointment in the last year OR does the patient have an upcoming appointment? Yes  Can we respond through MyChart? No, please call [phone] if needed.   Agent: Please be advised that Rx refills may take up to 3 business days. We ask that you follow-up with your pharmacy.

## 2024-12-04 NOTE — Telephone Encounter (Signed)
 Courtesy refill, pt needs an appointment for further refills.

## 2024-12-05 ENCOUNTER — Other Ambulatory Visit (HOSPITAL_COMMUNITY): Payer: Self-pay

## 2024-12-07 ENCOUNTER — Other Ambulatory Visit: Payer: Self-pay | Admitting: Family Medicine

## 2024-12-07 DIAGNOSIS — Z1231 Encounter for screening mammogram for malignant neoplasm of breast: Secondary | ICD-10-CM

## 2024-12-07 NOTE — Telephone Encounter (Signed)
 Got patient scheduled for appointment to see dr.mannam as she was overdue,refill was sent in on 1/2

## 2024-12-09 ENCOUNTER — Encounter: Payer: Self-pay | Admitting: Pulmonary Disease

## 2024-12-09 ENCOUNTER — Ambulatory Visit: Admitting: Pulmonary Disease

## 2024-12-09 VITALS — BP 136/72 | HR 53 | Temp 97.9°F | Ht 69.0 in | Wt 169.0 lb

## 2024-12-09 DIAGNOSIS — C50919 Malignant neoplasm of unspecified site of unspecified female breast: Secondary | ICD-10-CM | POA: Diagnosis not present

## 2024-12-09 DIAGNOSIS — Z87891 Personal history of nicotine dependence: Secondary | ICD-10-CM

## 2024-12-09 DIAGNOSIS — J439 Emphysema, unspecified: Secondary | ICD-10-CM

## 2024-12-09 DIAGNOSIS — J449 Chronic obstructive pulmonary disease, unspecified: Secondary | ICD-10-CM | POA: Diagnosis not present

## 2024-12-09 NOTE — Patient Instructions (Signed)
" °  VISIT SUMMARY: Today, you had a follow-up visit to check on your COPD and breast cancer history. Your COPD is stable and well-controlled with your current medication, and you are continuing regular follow-ups with your oncologist for breast cancer surveillance.  YOUR PLAN: CHRONIC OBSTRUCTIVE PULMONARY DISEASE (COPD): Your COPD symptoms are stable and well controlled with your current medication. -Continue using Trelegy as prescribed. -Schedule an annual follow-up visit for COPD management. -A CT scan of your chest is scheduled for April 2026 for routine surveillance.  HISTORY OF MALIGNANT NEOPLASM OF BREAST: You have completed chemotherapy for breast cancer and are currently under regular surveillance with your oncologist. -Continue follow-up visits with your oncologist, Dr. Lanny, every six months.  "

## 2024-12-09 NOTE — Progress Notes (Signed)
 "              Ashley Robertson    [id number]    07-25-1956  Primary Care Physician:McNeill, Sari, MD  Referring Physician: Aisha Sari, MD [address] [place],  [place] [address]  Chief complaint: Follow up for COPD  HPI: 69 y.o. Ex smoker with history of hypertension, hyperlipidemia, emphysema, COPD  Symptoms are intermittent wheezing.  Occasional cough with white mucus.  No symptoms of dyspnea or exercise limitation.  She has an albuterol  inhaler which she uses once a month.  No history of exacerbations or hospitalizations for respiratory issues  History of HER2 ER2 right breast cancer diagnosed in 2023 status post surgery, chemotherapy radiation and hormonal therapy  Interim history: Discussed the use of AI scribe software for clinical note transcription with the patient, who gave verbal consent to proceed.  History of Present Illness Ashley Robertson is a 69 year old female with COPD who presents for a follow-up visit.  Chronic obstructive pulmonary disease (copd) symptoms - COPD symptoms are stable and well controlled - Uses Trelegy regularly for maintenance therapy  Oncologic history - Completed breast cancer chemotherapy in November - Continues follow-up with oncology   Relevant pulmonary history Pets: Dog Occupation: Retired Scientist, Forensic at [employer]: No mold, hot tub, Jacuzzi.  No feather pillows or comforter Smoking history: 41-pack-year smoker.  Quit smoking in April 2023 Travel history: No significant travel history Relevant family history: No family history of lung disease  Outpatient Encounter Medications as of 12/09/2024  Medication Sig   acetaminophen  (TYLENOL ) 325 MG tablet Take 2 tablets (650 mg total) by mouth every 6 (six) hours as needed for mild pain (pain score 1-3), fever or headache.   albuterol  (VENTOLIN  HFA) 108 (90 Base) MCG/ACT inhaler Inhale 2 puffs into the lungs every 6 (six) hours as needed for wheezing or shortness of breath.    alendronate  (FOSAMAX ) 70 MG tablet Take 1 tablet (70 mg total) by mouth once a week 30 minutes before the first food, beverage or medicine of the day with plain water   amLODipine  (NORVASC ) 10 MG tablet Take 1 tablet (10 mg total) by mouth at bedtime.   anastrozole  (ARIMIDEX ) 1 MG tablet Take 1 tablet (1 mg) by mouth daily.   apixaban  (ELIQUIS ) 5 MG TABS tablet Take 1 tablet (5 mg total) by mouth 2 (two) times daily.   Calcium  Carb-Cholecalciferol  (CALCIUM  + VITAMIN D3 PO) Take 1 tablet by mouth at bedtime.   carvedilol  (COREG ) 3.125 MG tablet Take 1 tablet (3.125 mg total) by mouth 2 (two) times daily.   Cholecalciferol  (VITAMIN D -3 PO) Take 1 tablet by mouth at bedtime.   ezetimibe  (ZETIA ) 10 MG tablet Take 1 tablet (10 mg total) by mouth daily.   FLUoxetine  (PROZAC ) 20 MG capsule Take 1 capsule (20 mg total) by mouth daily.   Fluticasone -Umeclidin-Vilant (TRELEGY ELLIPTA ) 200-62.5-25 MCG/ACT AEPB Inhale 1 puff into the lungs daily.   Prenatal Vit-Fe Fumarate-FA (PRENATAL PO) Take 1 tablet by mouth at bedtime.   rosuvastatin  (CRESTOR ) 5 MG tablet Take 1 tablet by mouth daily at bedtime ,except on Monday, Wednesday and Fridays take 2 tablets at bedtime as directed   albuterol  (PROVENTIL  HFA) 108 (90 Base) MCG/ACT inhaler Inhale 1 to 2 puffs into the lungs every 6 hours as needed (Patient not taking: Reported on 12/09/2024)   ezetimibe  (ZETIA ) 10 MG tablet Take 1 tablet (10 mg total) by mouth daily. (Patient not taking: Reported on 12/09/2024)  magic mouthwash (lidocaine , diphenhydrAMINE , alum & mag hydroxide) suspension Swish, gargle, and spit 5-10 mLs for 1 minute 4 (four) times daily as directed. (Patient not taking: Reported on 12/09/2024)   [name]  Maleate (NERLYNX ) 40 MG tablet Take 6 tablets (240 mg total) by mouth daily. Take with food. (Patient not taking: Reported on 12/09/2024)   sulfamethoxazole -trimethoprim  (BACTRIM  DS) 800-160 MG tablet Take 1 tablet by mouth 2 (two) times daily. (Patient  not taking: Reported on 12/09/2024)   [DISCONTINUED] hydrochlorothiazide  (HYDRODIURIL ) 25 MG tablet Take 1 tablet (25 mg total) by mouth daily. (Patient not taking: Reported on 02/05/2024)   No facility-administered encounter medications on file as of 12/09/2024.    Vitals:   12/09/24 1051  BP: 136/72  Pulse: (!) 53  Temp: 97.9 F (36.6 C)  Height: 5' 9 (1.753 m)  Weight: 169 lb (76.7 kg)  SpO2: 96%  TempSrc: Oral  BMI (Calculated): 24.95    Physical Exam GEN: No acute distress. CV: Regular rate and rhythm, no murmurs. LUNGS: Clear to auscultation bilaterally, normal respiratory effort. SKIN JOINTS: Warm and dry, no rash.    Data Reviewed: Imaging: CT chest 03/09/2020- mild emphysema, 5 proximal 6 mm lung nodule, coronary atherosclerosis.  Screening CT chest 04/10/2021-mild centrilobular emphysema, mild biapical scarring stable pulmonary nodule. CT chest 03/05/2022-right breast mass, axillary lymph node enlargement, emphysema with fine centrilobular nodularity, infrarenal abdominal aortic aneurysm CT chest 03/12/2024-visualized lungs show right lower lobe mucous plugging, postradiation changes, left lower lobe linear atelectasis, scarring, pulmonary nodules I have reviewed the images personally.  PFTs: 04/10/2021 FVC 3.56 [93%], FEV1 2.22 [76%], F/F 63, TLC 7.23 [124%], DLCO 18.57 [79%] Moderate obstruction with bronchodilator response  Labs: CBC 08/31/2021- WBC 7.5, eos 1.5%, absolute eosinophil count 112.5 IgE 08/31/2021- 20 Alpha-1 antitrypsin 08/31/2021- 205, PI MM  Assessment & Plan Chronic obstructive pulmonary disease COPD is well-controlled with Trelegy. - Continue Trelegy as prescribed. - Order follow-up CT chest in April 2024.  Will need to reenroll in low-dose screening CT after that. - Schedule annual follow-up visit for COPD management.  History of malignant neoplasm of breast Completed chemotherapy in November. Currently under surveillance with oncologist, Dr. Lanny,  with follow-up every six months.    Plan/Recommendations: Continue Trelegy Follow-up CT scan  Lonna Coder MD [facility] 12/09/2024, 11:01 AM  CC: [name], MD  "

## 2024-12-21 ENCOUNTER — Ambulatory Visit: Admitting: Hematology

## 2024-12-21 ENCOUNTER — Other Ambulatory Visit

## 2024-12-22 ENCOUNTER — Other Ambulatory Visit: Payer: Self-pay | Admitting: Nurse Practitioner

## 2024-12-22 ENCOUNTER — Inpatient Hospital Stay

## 2024-12-22 ENCOUNTER — Inpatient Hospital Stay: Admitting: Hematology

## 2024-12-22 DIAGNOSIS — Z170 Estrogen receptor positive status [ER+]: Secondary | ICD-10-CM

## 2024-12-22 NOTE — Progress Notes (Unsigned)
 " Patient Care Team: [name], MD as PCP - General (Family Medicine) [name] ORN, MD as PCP - Cardiology (Cardiology) [name], MD (Inactive) as PCP - Electrophysiology (Cardiology) [name], RN as Oncology Nurse Navigator [name], MD as Consulting Physician (General Surgery) [name], MD as Consulting Physician (Hematology) Dewey Rush, MD as Consulting Physician (Radiation Oncology) Ann Mayme POUR, NP as Nurse Practitioner (Nurse Practitioner)  Clinic Day:  12/23/2024  Referring physician: [name], MD  ASSESSMENT & PLAN:   Assessment & Plan: Malignant neoplasm of overlapping sites of right breast in female, estrogen receptor positive (HCC) invasive lobular carcinoma, Stage IIIA, c(T3, N1), yp(T3, N1a), ER+/PR-/HER2+, Grade 2  -Diagnosed in 01/2022 -s/p neoadjuvant chemo TCHP X6, followed by right mastectomy by Dr. Ebbie and reconstruction by Dr. [name]. Unfortunately she did not have much response to neoadjvuant chemo  -She completed adjuvant Kadcyla  on 06/20/2023 -she received postmastectomy radiation under Dr. Dewey, 10/10 - 10/29/22. -she started adjuvant anastrozole  in January 2024, she is tolerating very well, will continue for 7 years if she tolerates  -Echocardiogram in January 2024 showed slightly decreased EF 50 to 55%.  She was seen by cardiologist after echo, her cardiac medication was adjusted, repeated echo on 03/18/23 showed normal EF 55%.  -she has been on adjuvant neratinib  since August 2024, she held it for about 2 months in 2025 due to her surgeries, plan to compete in October 2025. -completed treatment with Nerlynx  in November 2025. -Continue breast cancer surveillance, Signatera was negative in May 2025, will continue every 6 months. - Signatera in 09/2024 was negative.  Next due in 03/2025. -unilateral screening mammogram for left breast scheduled for 12/24/2024.    Bone health DEXA scan done 03/28/2023.  Results  were consistent with osteopenia/low bone mass.  FRAX 10-year probability of fracture is 13.6% and hip fracture is 3.1%.  She is currently on Fosamax  weekly.  She will be due for repeat DEXA scan in April 2026.  Right breast cancer - ER/PR +, HER2 + Completed treatment with Nerlynx  in November 2025. She continues anastrozole  without negative side effects. She is scheduled for unilateral screening mammogram for the left breast 12/23/2024. Plan for labs and follow up in 6 months, sooner if needed.   Plan Labs reviewed.  -unremarkable CBC and CMP.  Unilateral screening mammogram for left breast 12/24/2024.  Continue anastrozole  daily.  Plan for labs and follow up in 6 months, sooner if needed.   The patient understands the plans discussed today and is in agreement with them.  She knows to contact our office if she develops concerns prior to her next appointment.  I provided 25 minutes of face-to-face time during this encounter and > 50% was spent counseling as documented under my assessment and plan.    [name], NP  [facility] - A DEPT OF [facility] [address] [place] [address] Dept: [phone] Dept Fax: [phone]   No orders of the defined types were placed in this encounter.     CHIEF COMPLAINT:  CC: Right breast cancer, ER +, HER2 +  Current Treatment: Adjuvant anastrozole  and Nerlynx   INTERVAL HISTORY:  Ashley Robertson is here today for repeat clinical assessment.  She last saw Dr. Lanny on 08/24/2024.  Signatera test done 09/2024 was negative.  Continues adjuvant anastrozole  and Nerlynx .  She completed treatment with Nerlynx  in November. Continues anastrozole . Tolerating well without unusual hot flashes,  night sweats, or joint pain. She denies changes, lumps or masses in the left breast. She denies chest pain, chest pressure, or shortness of breath. She denies headaches or visual disturbances. She denies  abdominal pain, nausea, vomiting, or changes in bowel or bladder habits.  Scheduled for unilateral screening mammogram on 12/24/2024.  She denies fevers or chills. She denies pain. Her appetite is good.   I have reviewed the past medical history, past surgical history, social history and family history with the patient and they are unchanged from previous note.  ALLERGIES:  is allergic to zestril  [lisinopril ] and lipitor [atorvastatin].  MEDICATIONS:  Current Outpatient Medications  Medication Sig Dispense Refill   acetaminophen  (TYLENOL ) 325 MG tablet Take 2 tablets (650 mg total) by mouth every 6 (six) hours as needed for mild pain (pain score 1-3), fever or headache.     albuterol  (PROVENTIL  HFA) 108 (90 Base) MCG/ACT inhaler Inhale 1 to 2 puffs into the lungs every 6 hours as needed 8.5 g 2   albuterol  (VENTOLIN  HFA) 108 (90 Base) MCG/ACT inhaler Inhale 2 puffs into the lungs every 6 (six) hours as needed for wheezing or shortness of breath. 6.7 g 1   alendronate  (FOSAMAX ) 70 MG tablet Take 1 tablet (70 mg total) by mouth once a week 30 minutes before the first food, beverage or medicine of the day with plain water 12 tablet 4   amLODipine  (NORVASC ) 10 MG tablet Take 1 tablet (10 mg total) by mouth at bedtime. 90 tablet 3   anastrozole  (ARIMIDEX ) 1 MG tablet Take 1 tablet (1 mg) by mouth daily. 90 tablet 3   apixaban  (ELIQUIS ) 5 MG TABS tablet Take 1 tablet (5 mg total) by mouth 2 (two) times daily. 180 tablet 3   Calcium  Carb-Cholecalciferol  (CALCIUM  + VITAMIN D3 PO) Take 1 tablet by mouth at bedtime.     carvedilol  (COREG ) 3.125 MG tablet Take 1 tablet (3.125 mg total) by mouth 2 (two) times daily. 180 tablet 3   Cholecalciferol  (VITAMIN D -3 PO) Take 1 tablet by mouth at bedtime.     ezetimibe  (ZETIA ) 10 MG tablet Take 1 tablet (10 mg total) by mouth daily. 90 tablet 2   ezetimibe  (ZETIA ) 10 MG tablet Take 1 tablet (10 mg total) by mouth daily. 90 tablet 4   FLUoxetine  (PROZAC ) 20 MG  capsule Take 1 capsule (20 mg total) by mouth daily. 90 capsule 4   Fluticasone -Umeclidin-Vilant (TRELEGY ELLIPTA ) 200-62.5-25 MCG/ACT AEPB Inhale 1 puff into the lungs daily. 60 each 0   magic mouthwash (lidocaine , diphenhydrAMINE , alum & mag hydroxide) suspension Swish, gargle, and spit 5-10 mLs for 1 minute 4 (four) times daily as directed. 480 mL 0   Prenatal Vit-Fe Fumarate-FA (PRENATAL PO) Take 1 tablet by mouth at bedtime.     rosuvastatin  (CRESTOR ) 5 MG tablet Take 1 tablet by mouth daily at bedtime ,except on Monday, Wednesday and Fridays take 2 tablets at bedtime as directed 180 tablet 3   sulfamethoxazole -trimethoprim  (BACTRIM  DS) 800-160 MG tablet Take 1 tablet by mouth 2 (two) times daily. (Patient not taking: Reported on 12/23/2024) 20 tablet 1   No current facility-administered medications for this visit.    HISTORY OF PRESENT ILLNESS:   Oncology History Overview Note   Cancer Staging  Malignant neoplasm of overlapping sites of right breast in female, estrogen receptor positive (HCC) Staging form: Breast, AJCC 8th Edition - Clinical stage from 02/09/2022: Stage IIA (cT2, cN1, cM0, G2, ER+, PR-, HER2+) - Signed by [name],  MD on 02/20/2022    Malignant neoplasm of overlapping sites of right breast in female, estrogen receptor positive (HCC)  02/08/2022 Mammogram   CLINICAL DATA:  Acute onset right breast lump.  EXAM: DIGITAL DIAGNOSTIC UNILATERAL RIGHT MAMMOGRAM WITH TOMOSYNTHESIS AND CAD; ULTRASOUND RIGHT BREAST LIMITED  IMPRESSION: 1. Highly suspicious findings mammographically and sonographically. I suspect there is significant malignancy throughout most of the right breast. Discrete masses are seen at 11 o'clock and 6 o'clock.  A single borderline lymph node is identified. This lymph node appears prominent compared to the remainder of the lymph nodes. The skin thickening suggests the possibility of inflammatory breast cancer.   02/09/2022 Cancer Staging   Staging form:  Breast, AJCC 8th Edition - Clinical stage from 02/09/2022: Stage IIIA (cT3, cN1, cM0, G2, ER+, PR-, HER2+) - Signed by [name], MD on 03/05/2022 Stage prefix: Initial diagnosis Histologic grading system: 3 grade system   02/09/2022 Initial Biopsy   Diagnosis 1. Breast, right, needle core biopsy, right breast 11 o'clock mass ribbon clip - INVASIVE MAMMARY CARCINOMA - SEE COMMENT 2. Breast, right, needle core biopsy, right breast 6'oclock mass, coil clip - INVASIVE MAMMARY CARCINOMA - SEE COMMENT 3. Lymph node, needle/core biopsy, right axillary lymph node, tribell clip - METASTATIC CARCINOMA INVOLVING NODAL TISSUE Microscopic Comment 1. and 2. The biopsy material shows an infiltrative proliferation of cells arranged linearly and in small clusters. Based on the biopsy, the carcinoma appears Nottingham grade 2 of 3 and measures 1.5 cm in greatest linear extent.  Addendum parts 1 and 2: Immunohistochemistry for E-cadherin is negative consistent with lobular carcinoma.  2. PROGNOSTIC INDICATORS Results: The tumor cells are EQUIVOCAL for Her2 (2+). Her2 by FISH will be performed and the results reported separately. Estrogen Receptor: 100%, POSITIVE, STRONG STAINING INTENSITY Progesterone Receptor: <1%, NEGATIVE Proliferation Marker Ki67: 10%  2. FLUORESCENCE IN-SITU HYBRIDIZATION Results: GROUP 1: HER2 **POSITIVE**   3. PROGNOSTIC INDICATORS Results: By immunohistochemistry, the tumor cells are POSITIVE for Her2 (3+). Estrogen Receptor: 100%, POSITIVE, STRONG STAINING INTENSITY Progesterone Receptor: <1%, NEGATIVE   02/16/2022 Initial Diagnosis   Malignant neoplasm of overlapping sites of right breast in female, estrogen receptor positive (HCC)   03/01/2022 Imaging   EXAM: BILATERAL BREAST MRI WITH AND WITHOUT CONTRAST  IMPRESSION: 1. 8.5 x 8.3 x 5.9 cm area of confluent mass-like enhancement in the right breast involving all 4 quadrants and containing 2 biopsy marker clip  artifacts, compatible with 4 quadrant biopsy-proven malignancy. 2. Biopsy-proven metastatic lymph node in the right axilla. 3. Possible metastatic intramammary lymph node in the posterior outer right breast just above the level of the nipple. 4. No evidence of malignancy on the left.   03/01/2022 Genetic Testing   Negative hereditary cancer genetic testing: no pathogenic variants detected in [facility] CustomNext-Cancer +RNAinsight Panel.  Variant of uncertain significance reported in BRIP1 at p.F934V (c.2800T>G). Report date is March 01, 2022.    The CustomNext-Cancer+RNAinsight panel offered by [employer] includes sequencing and rearrangement analysis for the following 47 genes:  APC, ATM, AXIN2, BARD1, BMPR1A, BRCA1, BRCA2, BRIP1, CDH1, CDK4, CDKN2A, CHEK2, DICER1, EPCAM, GREM1, HOXB13, MEN1, MLH1, MSH2, MSH3, MSH6, MUTYH, NBN, NF1, NF2, NTHL1, PALB2, PMS2, POLD1, POLE, PTEN, RAD51C, RAD51D, RECQL, RET, SDHA, SDHAF2, SDHB, SDHC, SDHD, SMAD4, SMARCA4, STK11, TP53, TSC1, TSC2, and VHL.  RNA data is routinely analyzed for use in variant interpretation for all genes.  UPDATE: BRIP1 p.F934V VUS has been amended to Likely Benign. Amended report date is 11/06/2022.    03/05/2022 Imaging  EXAM: CT CHEST, ABDOMEN, AND PELVIS WITH CONTRAST  IMPRESSION: 1. Asymmetric, masslike density of the glandular tissue of the right breast with overlying skin thickening, in keeping with known primary breast malignancy. 2. Enlarged, ill-defined right axillary lymph node containing a biopsy marking clip, consistent with known nodal metastatic disease. 3. No other evidence of lymphadenopathy or metastatic disease in the chest, abdomen, or pelvis. 4. Emphysema. Background of fine centrilobular nodularity, most concentrated in the lung apices, consistent with smoking-related respiratory bronchiolitis. 5. Aneurysm of the infrarenal abdominal aorta measuring up to 5.4 x 5.3 cm with a large burden of eccentric mural  thrombus. Recommend follow-up CT/MR every 6 months and vascular consultation. This recommendation follows ACR consensus guidelines: White Paper of the ACR Incidental Findings Committee II on Vascular Findings. J Am Coll Radiol 2013; 10:789-794. 6. Coronary artery disease.   03/05/2022 Imaging   EXAM: NUCLEAR MEDICINE WHOLE BODY BONE SCAN  IMPRESSION: No evidence of bony metastatic disease.   03/07/2022 - 08/01/2022 Chemotherapy   Patient is on Treatment Plan : BREAST  Docetaxel  + Carboplatin  + Trastuzumab  + Pertuzumab   (TCHP) q21d      07/26/2022 Surgery   Right mastectomy with axillary node seed guided excision converted to axillary node dissection with reconstruction by Drs. Ebbie and Thimmappa   07/26/2022 Pathology Results   FINAL MICROSCOPIC DIAGNOSIS:   A. LYMPH NODE, RIGHT AXILLARY TARGETED, EXCISION:  - Metastatic carcinoma involving one lymph node, 2 cm (1/1).  - Focal extranodal extension.  - Biopsy site and biopsy clip.   B. BREAST, RIGHT, MASTECTOMY:  - Invasive lobular carcinoma, 9.5 cm (ypT3).  - Carcinoma involves dermis of nipple.  - All surgical margins negative for carcinoma.  - One lymph node negative for metastatic carcinoma (0/1).  - Biopsy sites and biopsy clips.  - See oncology table.   C. LYMPH NODE, RIGHT AXILLARY, SENTINEL, EXCISION:  - One lymph node negative for metastatic carcinoma (0/1).   D. LYMPH NODE, RIGHT AXILLARY, SENTINEL, EXCISION:  - Metastatic carcinoma in one lymph node, 0.5 cm. (1/1).   E. LYMPH NODE, RIGHT AXILLARY, SENTINEL, EXCISION:  - One lymph node negative for metastatic carcinoma (0/1).    07/26/2022 Cancer Staging   Cancer Staging  Malignant neoplasm of overlapping sites of right breast in female, estrogen receptor positive (HCC) Staging form: Breast, AJCC 8th Edition - Clinical stage from 02/09/2022: Stage IIIA (cT3, cN1, cM0, G2, ER+, PR-, HER2+) - Signed by [name], MD on 03/05/2022 Stage prefix: Initial  diagnosis Histologic grading system: 3 grade system - Pathologic stage from 07/26/2022: Stage IIIA (pT3, pN1a, cM0, G2, ER+, PR-, HER2+) - Signed by Ann Mayme POUR, NP on 08/01/2022 Histologic grading system: 3 grade system    07/26/2022 Cancer Staging   Staging form: Breast, AJCC 8th Edition - Pathologic stage from 07/26/2022: Stage IIIA (pT3, pN1a, cM0, G2, ER+, PR-, HER2+) - Signed by Burton, Lacie K, NP on 08/01/2022 Histologic grading system: 3 grade system   08/24/2022 - 06/20/2023 Chemotherapy   Patient is on Treatment Plan : BREAST ADO-Trastuzumab Emtansine  (Kadcyla ) q21d     07/24/2023 Imaging   Echocardiogram  Normal LVF at 55-60 % Normal left diastolic function. Normal right ventricle  Normal visualized valves with preserved function   03/24/2024 Survivorship   SCP delivered by Lacie Burton, NP       REVIEW OF SYSTEMS:   Constitutional: Denies fevers, chills or abnormal weight loss Eyes: Denies blurriness of vision Ears, nose, mouth, throat, and face: Denies mucositis or  sore throat Respiratory: Denies cough, dyspnea or wheezes Cardiovascular: Denies palpitation, chest discomfort or lower extremity swelling Gastrointestinal:  Denies nausea, heartburn or change in bowel habits Skin: Denies abnormal skin rashes Lymphatics: Denies new lymphadenopathy or easy bruising Neurological:Denies numbness, tingling or new weaknesses Behavioral/Psych: Mood is stable, no new changes  All other systems were reviewed with the patient and are negative.   VITALS:   Today's Vitals   12/23/24 0800 12/23/24 0837  BP:  106/60  Pulse:  (!) 54  Resp:  18  Temp:  97.9 F (36.6 C)  TempSrc:  Temporal  SpO2:  97%  Weight:  173 lb 3.2 oz (78.6 kg)  PainSc: 0-No pain    Body mass index is 25.58 kg/m.   Wt Readings from Last 3 Encounters:  12/23/24 173 lb 3.2 oz (78.6 kg)  12/09/24 169 lb (76.7 kg)  11/12/24 164 lb (74.4 kg)    Body mass index is 25.58 kg/m.  Performance status  (ECOG): 0 - Asymptomatic  PHYSICAL EXAM:   GENERAL:alert, no distress and comfortable SKIN: skin color, texture, turgor are normal, no rashes or significant lesions EYES: normal, Conjunctiva are pink and non-injected, sclera clear OROPHARYNX:no exudate, no erythema and lips, buccal mucosa, and tongue normal  NECK: supple, thyroid  normal size, non-tender, without nodularity LYMPH:  no palpable lymphadenopathy in the cervical, axillary or inguinal LUNGS: clear to auscultation and percussion with normal breathing effort HEART: regular rate & rhythm and no murmurs and no lower extremity edema ABDOMEN:abdomen soft, non-tender and normal bowel sounds Musculoskeletal:no cyanosis of digits and no clubbing  NEURO: alert & oriented x 3 with fluent speech, no focal motor/sensory deficits BREASt: well healed mastectomy scar on right chest wall. No palpable masses or lumps are noted today. There is no axillary lymphadenitis on the right. There are no palpable lumps or masses in the left breast today. There is well healed surgical scare adjacent to the left areola. There is no nipple inversion or nipple discharge. There is no axillary lymphadenopathy on the left.   LABORATORY DATA:  I have reviewed the data as listed    Component Value Date/Time   NA 140 12/23/2024 0810   NA 136 04/29/2024 0834   K 4.6 12/23/2024 0810   CL 103 12/23/2024 0810   CO2 27 12/23/2024 0810   GLUCOSE 112 (H) 12/23/2024 0810   BUN 24 (H) 12/23/2024 0810   BUN 15 04/29/2024 0834   CREATININE 0.92 12/23/2024 0810   CALCIUM  9.3 12/23/2024 0810   PROT 7.6 12/23/2024 0810   PROT 6.8 04/29/2024 0834   ALBUMIN  4.4 12/23/2024 0810   ALBUMIN  3.9 04/29/2024 0834   AST 28 12/23/2024 0810   ALT 21 12/23/2024 0810   ALKPHOS 94 12/23/2024 0810   BILITOT 0.3 12/23/2024 0810   GFRNONAA >60 12/23/2024 0810   GFRAA 107 11/03/2020 1206    Lab Results  Component Value Date   WBC 4.3 12/23/2024   NEUTROABS 2.8 12/23/2024   HGB  14.1 12/23/2024   HCT 41.7 12/23/2024   MCV 97.7 12/23/2024   PLT 164 12/23/2024     "

## 2024-12-22 NOTE — Assessment & Plan Note (Addendum)
 invasive lobular carcinoma, Stage IIIA, c(T3, N1), yp(T3, N1a), ER+/PR-/HER2+, Grade 2  -Diagnosed in 01/2022 -s/p neoadjuvant chemo TCHP X6, followed by right mastectomy by Dr. Ebbie and reconstruction by Dr. [name]. Unfortunately she did not have much response to neoadjvuant chemo  -She completed adjuvant Kadcyla  on 06/20/2023 -she received postmastectomy radiation under Dr. Dewey, 10/10 - 10/29/22. -she started adjuvant anastrozole  in January 2024, she is tolerating very well, will continue for 7 years if she tolerates  -Echocardiogram in January 2024 showed slightly decreased EF 50 to 55%.  She was seen by cardiologist after echo, her cardiac medication was adjusted, repeated echo on 03/18/23 showed normal EF 55%.  -she has been on adjuvant neratinib  since August 2024, she held it for about 2 months in 2025 due to her surgeries, plan to compete in October 2025. -completed treatment with Nerlynx  in November 2025. -Continue breast cancer surveillance, Signatera was negative in May 2025, will continue every 6 months. - Signatera in 09/2024 was negative.  Next due in 03/2025. -unilateral screening mammogram for left breast scheduled for 12/24/2024.

## 2024-12-23 ENCOUNTER — Inpatient Hospital Stay: Admitting: Nurse Practitioner

## 2024-12-23 ENCOUNTER — Inpatient Hospital Stay: Attending: Hematology

## 2024-12-23 ENCOUNTER — Encounter: Payer: Self-pay | Admitting: Nurse Practitioner

## 2024-12-23 ENCOUNTER — Encounter: Payer: Self-pay | Admitting: Hematology

## 2024-12-23 VITALS — BP 106/60 | HR 54 | Temp 97.9°F | Resp 18 | Wt 173.2 lb

## 2024-12-23 DIAGNOSIS — Z79811 Long term (current) use of aromatase inhibitors: Secondary | ICD-10-CM | POA: Diagnosis not present

## 2024-12-23 DIAGNOSIS — C50811 Malignant neoplasm of overlapping sites of right female breast: Secondary | ICD-10-CM

## 2024-12-23 DIAGNOSIS — Z1731 Human epidermal growth factor receptor 2 positive status: Secondary | ICD-10-CM | POA: Diagnosis not present

## 2024-12-23 DIAGNOSIS — Z170 Estrogen receptor positive status [ER+]: Secondary | ICD-10-CM | POA: Insufficient documentation

## 2024-12-23 DIAGNOSIS — Z9011 Acquired absence of right breast and nipple: Secondary | ICD-10-CM | POA: Diagnosis not present

## 2024-12-23 DIAGNOSIS — Z1722 Progesterone receptor negative status: Secondary | ICD-10-CM | POA: Diagnosis not present

## 2024-12-23 DIAGNOSIS — C773 Secondary and unspecified malignant neoplasm of axilla and upper limb lymph nodes: Secondary | ICD-10-CM | POA: Diagnosis not present

## 2024-12-23 LAB — CMP (CANCER CENTER ONLY)
ALT: 21 U/L (ref 0–44)
AST: 28 U/L (ref 15–41)
Albumin: 4.4 g/dL (ref 3.5–5.0)
Alkaline Phosphatase: 94 U/L (ref 38–126)
Anion gap: 10 (ref 5–15)
BUN: 24 mg/dL — ABNORMAL HIGH (ref 8–23)
CO2: 27 mmol/L (ref 22–32)
Calcium: 9.3 mg/dL (ref 8.9–10.3)
Chloride: 103 mmol/L (ref 98–111)
Creatinine: 0.92 mg/dL (ref 0.44–1.00)
GFR, Estimated: >60 mL/min
Glucose, Bld: 112 mg/dL — ABNORMAL HIGH (ref 70–99)
Potassium: 4.6 mmol/L (ref 3.5–5.1)
Sodium: 140 mmol/L (ref 135–145)
Total Bilirubin: 0.3 mg/dL (ref 0.0–1.2)
Total Protein: 7.6 g/dL (ref 6.5–8.1)

## 2024-12-23 LAB — CBC WITH DIFFERENTIAL (CANCER CENTER ONLY)
Abs Immature Granulocytes: 0.02 K/uL (ref 0.00–0.07)
Basophils Absolute: 0 K/uL (ref 0.0–0.1)
Basophils Relative: 1 %
Eosinophils Absolute: 0.1 K/uL (ref 0.0–0.5)
Eosinophils Relative: 3 %
HCT: 41.7 % (ref 36.0–46.0)
Hemoglobin: 14.1 g/dL (ref 12.0–15.0)
Immature Granulocytes: 1 %
Lymphocytes Relative: 19 %
Lymphs Abs: 0.8 K/uL (ref 0.7–4.0)
MCH: 33 pg (ref 26.0–34.0)
MCHC: 33.8 g/dL (ref 30.0–36.0)
MCV: 97.7 fL (ref 80.0–100.0)
Monocytes Absolute: 0.6 K/uL (ref 0.1–1.0)
Monocytes Relative: 13 %
Neutro Abs: 2.8 K/uL (ref 1.7–7.7)
Neutrophils Relative %: 63 %
Platelet Count: 164 K/uL (ref 150–400)
RBC: 4.27 MIL/uL (ref 3.87–5.11)
RDW: 13.9 % (ref 11.5–15.5)
WBC Count: 4.3 K/uL (ref 4.0–10.5)
nRBC: 0 % (ref 0.0–0.2)

## 2024-12-24 ENCOUNTER — Ambulatory Visit
Admission: RE | Admit: 2024-12-24 | Discharge: 2024-12-24 | Disposition: A | Discharge status: Home / Self Care | Source: Ambulatory Visit | Attending: Family Medicine | Admitting: Family Medicine

## 2024-12-24 DIAGNOSIS — Z1231 Encounter for screening mammogram for malignant neoplasm of breast: Secondary | ICD-10-CM

## 2024-12-29 ENCOUNTER — Other Ambulatory Visit: Payer: Self-pay | Admitting: Family Medicine

## 2024-12-29 ENCOUNTER — Other Ambulatory Visit: Payer: Self-pay | Admitting: Pulmonary Disease

## 2024-12-29 DIAGNOSIS — R928 Other abnormal and inconclusive findings on diagnostic imaging of breast: Secondary | ICD-10-CM

## 2025-01-01 ENCOUNTER — Other Ambulatory Visit (HOSPITAL_COMMUNITY): Payer: Self-pay

## 2025-01-01 ENCOUNTER — Other Ambulatory Visit: Payer: Self-pay

## 2025-01-01 MED ORDER — TRELEGY ELLIPTA 200-62.5-25 MCG/ACT IN AEPB
1.0000 | INHALATION_SPRAY | Freq: Every day | RESPIRATORY_TRACT | 3 refills | Status: AC
  Filled 2025-01-01: qty 60, 30d supply, fill #0

## 2025-01-06 ENCOUNTER — Ambulatory Visit

## 2025-01-06 ENCOUNTER — Ambulatory Visit
Admission: RE | Admit: 2025-01-06 | Discharge: 2025-01-06 | Disposition: A | Source: Ambulatory Visit | Attending: Family Medicine | Admitting: Family Medicine

## 2025-01-06 DIAGNOSIS — R928 Other abnormal and inconclusive findings on diagnostic imaging of breast: Secondary | ICD-10-CM

## 2025-03-12 ENCOUNTER — Other Ambulatory Visit

## 2025-03-30 ENCOUNTER — Ambulatory Visit

## 2025-04-27 ENCOUNTER — Ambulatory Visit (HOSPITAL_COMMUNITY): Admitting: Internal Medicine

## 2025-06-30 ENCOUNTER — Inpatient Hospital Stay

## 2025-06-30 ENCOUNTER — Inpatient Hospital Stay: Admitting: Hematology
